# Patient Record
Sex: Female | Born: 1955
Health system: Southern US, Community
[De-identification: ages and names within clinical notes are randomized; demographics above are authoritative.]

## PROBLEM LIST (undated history)

## (undated) DIAGNOSIS — G4733 Obstructive sleep apnea (adult) (pediatric): Secondary | ICD-10-CM

## (undated) DIAGNOSIS — M2550 Pain in unspecified joint: Secondary | ICD-10-CM

## (undated) DIAGNOSIS — I10 Essential (primary) hypertension: Secondary | ICD-10-CM

## (undated) DIAGNOSIS — N939 Abnormal uterine and vaginal bleeding, unspecified: Secondary | ICD-10-CM

## (undated) DIAGNOSIS — E039 Hypothyroidism, unspecified: Secondary | ICD-10-CM

## (undated) DIAGNOSIS — R600 Localized edema: Secondary | ICD-10-CM

## (undated) DIAGNOSIS — D509 Iron deficiency anemia, unspecified: Secondary | ICD-10-CM

## (undated) DIAGNOSIS — M549 Dorsalgia, unspecified: Secondary | ICD-10-CM

## (undated) DIAGNOSIS — M3500 Sicca syndrome, unspecified: Secondary | ICD-10-CM

## (undated) DIAGNOSIS — N920 Excessive and frequent menstruation with regular cycle: Secondary | ICD-10-CM

## (undated) HISTORY — DX: Morbid (severe) obesity due to excess calories: E66.01

## (undated) HISTORY — DX: Localized edema: R60.0

## (undated) HISTORY — DX: Excessive and frequent menstruation with regular cycle: N92.0

## (undated) HISTORY — DX: Essential (primary) hypertension: I10

## (undated) HISTORY — DX: Pain in unspecified joint: M25.50

## (undated) HISTORY — DX: Dorsalgia, unspecified: M54.9

## (undated) HISTORY — DX: Hypothyroidism, unspecified: E03.9

## (undated) HISTORY — DX: Iron deficiency anemia, unspecified: D50.9

## (undated) HISTORY — DX: Abnormal uterine and vaginal bleeding, unspecified: N93.9

## (undated) HISTORY — PX: WISDOM TOOTH EXTRACTION: SHX21

## (undated) HISTORY — DX: Obstructive sleep apnea (adult) (pediatric): G47.33

## (undated) HISTORY — DX: Sjogren syndrome, unspecified: M35.00

## (undated) HISTORY — PX: SHOULDER SURGERY: SHX246

## (undated) HISTORY — PX: TONSILLECTOMY: SUR1361

---

## 1975-03-10 HISTORY — PX: TUBAL LIGATION: SHX77

## 1997-03-09 HISTORY — PX: LUMBAR FUSION: SHX111

## 1997-08-14 ENCOUNTER — Encounter: Admission: RE | Admit: 1997-08-14 | Discharge: 1997-08-14 | Payer: Self-pay | Admitting: *Deleted

## 1997-09-01 ENCOUNTER — Emergency Department (HOSPITAL_COMMUNITY): Admission: EM | Admit: 1997-09-01 | Discharge: 1997-09-01 | Payer: Self-pay | Admitting: Emergency Medicine

## 1997-09-05 ENCOUNTER — Emergency Department (HOSPITAL_COMMUNITY): Admission: EM | Admit: 1997-09-05 | Discharge: 1997-09-05 | Payer: Self-pay | Admitting: Emergency Medicine

## 1997-11-10 ENCOUNTER — Emergency Department (HOSPITAL_COMMUNITY): Admission: EM | Admit: 1997-11-10 | Discharge: 1997-11-10 | Payer: Self-pay | Admitting: Emergency Medicine

## 1997-11-15 ENCOUNTER — Encounter: Admission: RE | Admit: 1997-11-15 | Discharge: 1998-02-13 | Payer: Self-pay

## 1997-11-15 ENCOUNTER — Emergency Department (HOSPITAL_COMMUNITY): Admission: EM | Admit: 1997-11-15 | Discharge: 1997-11-15 | Payer: Self-pay

## 1997-11-16 ENCOUNTER — Encounter: Admission: RE | Admit: 1997-11-16 | Discharge: 1998-02-14 | Payer: Self-pay | Admitting: Internal Medicine

## 1997-12-12 ENCOUNTER — Ambulatory Visit (HOSPITAL_COMMUNITY): Admission: RE | Admit: 1997-12-12 | Discharge: 1997-12-12 | Payer: Self-pay | Admitting: Neurosurgery

## 1997-12-12 ENCOUNTER — Encounter: Payer: Self-pay | Admitting: Neurosurgery

## 1997-12-26 ENCOUNTER — Encounter: Admission: RE | Admit: 1997-12-26 | Discharge: 1998-03-26 | Payer: Self-pay | Admitting: Anesthesiology

## 1998-01-23 ENCOUNTER — Inpatient Hospital Stay (HOSPITAL_COMMUNITY): Admission: AD | Admit: 1998-01-23 | Discharge: 1998-01-23 | Payer: Self-pay | Admitting: Obstetrics

## 1998-01-25 ENCOUNTER — Encounter: Payer: Self-pay | Admitting: Neurosurgery

## 1998-01-25 ENCOUNTER — Inpatient Hospital Stay (HOSPITAL_COMMUNITY): Admission: RE | Admit: 1998-01-25 | Discharge: 1998-01-26 | Payer: Self-pay | Admitting: Neurosurgery

## 1998-03-18 ENCOUNTER — Ambulatory Visit (HOSPITAL_COMMUNITY): Admission: RE | Admit: 1998-03-18 | Discharge: 1998-03-18 | Payer: Self-pay | Admitting: Neurosurgery

## 1998-03-18 ENCOUNTER — Encounter: Payer: Self-pay | Admitting: Neurosurgery

## 1998-04-30 ENCOUNTER — Ambulatory Visit (HOSPITAL_COMMUNITY): Admission: RE | Admit: 1998-04-30 | Discharge: 1998-04-30 | Payer: Self-pay | Admitting: Neurosurgery

## 1998-04-30 ENCOUNTER — Encounter: Payer: Self-pay | Admitting: Neurosurgery

## 1998-08-22 ENCOUNTER — Encounter: Admission: RE | Admit: 1998-08-22 | Discharge: 1998-08-22 | Payer: Self-pay | Admitting: Internal Medicine

## 1998-08-23 ENCOUNTER — Encounter: Admission: RE | Admit: 1998-08-23 | Discharge: 1998-08-23 | Payer: Self-pay | Admitting: Internal Medicine

## 1998-08-29 ENCOUNTER — Encounter: Admission: RE | Admit: 1998-08-29 | Discharge: 1998-08-29 | Payer: Self-pay | Admitting: Hematology and Oncology

## 1998-08-30 ENCOUNTER — Encounter: Admission: RE | Admit: 1998-08-30 | Discharge: 1998-11-28 | Payer: Self-pay | Admitting: *Deleted

## 1998-09-04 ENCOUNTER — Ambulatory Visit (HOSPITAL_COMMUNITY): Admission: RE | Admit: 1998-09-04 | Discharge: 1998-09-04 | Payer: Self-pay | Admitting: *Deleted

## 1998-09-07 DIAGNOSIS — E039 Hypothyroidism, unspecified: Secondary | ICD-10-CM | POA: Insufficient documentation

## 1998-09-07 DIAGNOSIS — D509 Iron deficiency anemia, unspecified: Secondary | ICD-10-CM | POA: Insufficient documentation

## 1998-10-02 ENCOUNTER — Encounter: Admission: RE | Admit: 1998-10-02 | Discharge: 1998-10-02 | Payer: Self-pay | Admitting: Hematology and Oncology

## 1998-10-15 ENCOUNTER — Encounter: Admission: RE | Admit: 1998-10-15 | Discharge: 1998-10-15 | Payer: Self-pay | Admitting: Hematology and Oncology

## 1998-10-23 ENCOUNTER — Inpatient Hospital Stay (HOSPITAL_COMMUNITY): Admission: AD | Admit: 1998-10-23 | Discharge: 1998-10-23 | Payer: Self-pay | Admitting: *Deleted

## 1998-11-15 ENCOUNTER — Encounter: Admission: RE | Admit: 1998-11-15 | Discharge: 1998-11-15 | Payer: Self-pay | Admitting: Obstetrics & Gynecology

## 1998-11-15 ENCOUNTER — Encounter (INDEPENDENT_AMBULATORY_CARE_PROVIDER_SITE_OTHER): Payer: Self-pay | Admitting: Hospitalist

## 1998-11-15 LAB — CONVERTED CEMR LAB: Pap Smear: NORMAL

## 1998-11-20 ENCOUNTER — Encounter: Admission: RE | Admit: 1998-11-20 | Discharge: 1998-11-20 | Payer: Self-pay | Admitting: Internal Medicine

## 1999-01-09 ENCOUNTER — Encounter: Admission: RE | Admit: 1999-01-09 | Discharge: 1999-01-09 | Payer: Self-pay | Admitting: Hematology and Oncology

## 1999-04-15 ENCOUNTER — Encounter: Admission: RE | Admit: 1999-04-15 | Discharge: 1999-04-15 | Payer: Self-pay | Admitting: Internal Medicine

## 1999-04-25 ENCOUNTER — Encounter: Admission: RE | Admit: 1999-04-25 | Discharge: 1999-04-25 | Payer: Self-pay | Admitting: Internal Medicine

## 2000-08-02 ENCOUNTER — Observation Stay (HOSPITAL_COMMUNITY): Admission: EM | Admit: 2000-08-02 | Discharge: 2000-08-03 | Payer: Self-pay

## 2000-08-02 ENCOUNTER — Encounter: Payer: Self-pay | Admitting: Emergency Medicine

## 2000-08-03 ENCOUNTER — Encounter: Payer: Self-pay | Admitting: Internal Medicine

## 2000-09-13 ENCOUNTER — Encounter: Admission: RE | Admit: 2000-09-13 | Discharge: 2000-09-13 | Payer: Self-pay | Admitting: Internal Medicine

## 2002-09-12 ENCOUNTER — Encounter: Admission: RE | Admit: 2002-09-12 | Discharge: 2002-09-12 | Payer: Self-pay | Admitting: Internal Medicine

## 2002-10-24 ENCOUNTER — Encounter: Admission: RE | Admit: 2002-10-24 | Discharge: 2002-10-24 | Payer: Self-pay | Admitting: Internal Medicine

## 2002-11-01 ENCOUNTER — Ambulatory Visit (HOSPITAL_COMMUNITY): Admission: RE | Admit: 2002-11-01 | Discharge: 2002-11-01 | Payer: Self-pay | Admitting: Hospitalist

## 2002-11-01 ENCOUNTER — Encounter: Payer: Self-pay | Admitting: Cardiology

## 2002-11-06 ENCOUNTER — Ambulatory Visit (HOSPITAL_COMMUNITY): Admission: RE | Admit: 2002-11-06 | Discharge: 2002-11-06 | Payer: Self-pay | Admitting: Internal Medicine

## 2003-01-23 ENCOUNTER — Encounter: Admission: RE | Admit: 2003-01-23 | Discharge: 2003-01-23 | Payer: Self-pay | Admitting: Internal Medicine

## 2003-01-24 ENCOUNTER — Encounter: Admission: RE | Admit: 2003-01-24 | Discharge: 2003-01-24 | Payer: Self-pay | Admitting: Internal Medicine

## 2003-02-05 ENCOUNTER — Encounter: Admission: RE | Admit: 2003-02-05 | Discharge: 2003-02-05 | Payer: Self-pay | Admitting: Internal Medicine

## 2003-02-06 ENCOUNTER — Encounter: Admission: RE | Admit: 2003-02-06 | Discharge: 2003-02-06 | Payer: Self-pay | Admitting: Internal Medicine

## 2003-03-13 ENCOUNTER — Encounter: Admission: RE | Admit: 2003-03-13 | Discharge: 2003-03-13 | Payer: Self-pay | Admitting: Internal Medicine

## 2003-04-17 ENCOUNTER — Ambulatory Visit (HOSPITAL_COMMUNITY): Admission: RE | Admit: 2003-04-17 | Discharge: 2003-04-17 | Payer: Self-pay | Admitting: Internal Medicine

## 2003-04-17 ENCOUNTER — Encounter: Admission: RE | Admit: 2003-04-17 | Discharge: 2003-04-17 | Payer: Self-pay | Admitting: Internal Medicine

## 2003-05-02 ENCOUNTER — Encounter: Payer: Self-pay | Admitting: Cardiology

## 2003-05-02 ENCOUNTER — Ambulatory Visit: Admission: RE | Admit: 2003-05-02 | Discharge: 2003-05-02 | Payer: Self-pay | Admitting: Internal Medicine

## 2003-05-09 ENCOUNTER — Encounter: Admission: RE | Admit: 2003-05-09 | Discharge: 2003-05-09 | Payer: Self-pay | Admitting: Internal Medicine

## 2003-05-23 ENCOUNTER — Ambulatory Visit (HOSPITAL_COMMUNITY): Admission: RE | Admit: 2003-05-23 | Discharge: 2003-05-23 | Payer: Self-pay | Admitting: Internal Medicine

## 2003-05-23 ENCOUNTER — Encounter: Admission: RE | Admit: 2003-05-23 | Discharge: 2003-05-23 | Payer: Self-pay | Admitting: Internal Medicine

## 2003-09-07 DIAGNOSIS — G4733 Obstructive sleep apnea (adult) (pediatric): Secondary | ICD-10-CM | POA: Insufficient documentation

## 2003-09-12 ENCOUNTER — Encounter: Admission: RE | Admit: 2003-09-12 | Discharge: 2003-09-12 | Payer: Self-pay | Admitting: Internal Medicine

## 2003-09-12 ENCOUNTER — Ambulatory Visit (HOSPITAL_COMMUNITY): Admission: RE | Admit: 2003-09-12 | Discharge: 2003-09-12 | Payer: Self-pay | Admitting: Internal Medicine

## 2003-09-19 ENCOUNTER — Ambulatory Visit (HOSPITAL_BASED_OUTPATIENT_CLINIC_OR_DEPARTMENT_OTHER): Admission: RE | Admit: 2003-09-19 | Discharge: 2003-09-19 | Payer: Self-pay | Admitting: Hospitalist

## 2003-10-10 ENCOUNTER — Emergency Department (HOSPITAL_COMMUNITY): Admission: EM | Admit: 2003-10-10 | Discharge: 2003-10-10 | Payer: Self-pay | Admitting: Emergency Medicine

## 2003-10-12 ENCOUNTER — Ambulatory Visit (HOSPITAL_COMMUNITY): Admission: RE | Admit: 2003-10-12 | Discharge: 2003-10-12 | Payer: Self-pay | Admitting: Internal Medicine

## 2003-10-12 ENCOUNTER — Encounter: Admission: RE | Admit: 2003-10-12 | Discharge: 2003-10-12 | Payer: Self-pay | Admitting: Internal Medicine

## 2003-10-21 ENCOUNTER — Ambulatory Visit (HOSPITAL_COMMUNITY): Admission: RE | Admit: 2003-10-21 | Discharge: 2003-10-21 | Payer: Self-pay | Admitting: Hospitalist

## 2003-10-31 ENCOUNTER — Encounter: Admission: RE | Admit: 2003-10-31 | Discharge: 2003-10-31 | Payer: Self-pay | Admitting: Internal Medicine

## 2003-12-31 ENCOUNTER — Ambulatory Visit (HOSPITAL_COMMUNITY): Admission: RE | Admit: 2003-12-31 | Discharge: 2003-12-31 | Payer: Self-pay | Admitting: Internal Medicine

## 2003-12-31 ENCOUNTER — Ambulatory Visit: Payer: Self-pay | Admitting: Internal Medicine

## 2004-01-14 ENCOUNTER — Ambulatory Visit: Payer: Self-pay | Admitting: Internal Medicine

## 2004-06-12 ENCOUNTER — Ambulatory Visit: Payer: Self-pay | Admitting: Internal Medicine

## 2004-09-16 ENCOUNTER — Ambulatory Visit: Payer: Self-pay | Admitting: Hospitalist

## 2004-09-23 ENCOUNTER — Ambulatory Visit (HOSPITAL_COMMUNITY): Admission: RE | Admit: 2004-09-23 | Discharge: 2004-09-23 | Payer: Self-pay | Admitting: Hospitalist

## 2004-10-01 ENCOUNTER — Ambulatory Visit: Payer: Self-pay | Admitting: Internal Medicine

## 2004-10-06 ENCOUNTER — Encounter: Admission: RE | Admit: 2004-10-06 | Discharge: 2005-01-04 | Payer: Self-pay

## 2004-10-30 ENCOUNTER — Ambulatory Visit: Payer: Self-pay | Admitting: Internal Medicine

## 2004-11-18 ENCOUNTER — Encounter (INDEPENDENT_AMBULATORY_CARE_PROVIDER_SITE_OTHER): Payer: Self-pay | Admitting: Hospitalist

## 2004-11-18 ENCOUNTER — Ambulatory Visit: Payer: Self-pay | Admitting: Family Medicine

## 2004-11-18 LAB — CONVERTED CEMR LAB: Pap Smear: NORMAL

## 2005-01-23 ENCOUNTER — Ambulatory Visit: Payer: Self-pay | Admitting: Family Medicine

## 2005-01-27 ENCOUNTER — Ambulatory Visit (HOSPITAL_COMMUNITY): Admission: RE | Admit: 2005-01-27 | Discharge: 2005-01-27 | Payer: Self-pay | Admitting: *Deleted

## 2005-02-24 ENCOUNTER — Ambulatory Visit: Payer: Self-pay | Admitting: Obstetrics and Gynecology

## 2005-03-12 ENCOUNTER — Ambulatory Visit: Payer: Self-pay | Admitting: Hospitalist

## 2005-04-01 ENCOUNTER — Ambulatory Visit: Payer: Self-pay | Admitting: Internal Medicine

## 2005-05-14 ENCOUNTER — Ambulatory Visit: Payer: Self-pay | Admitting: Hospitalist

## 2005-06-18 ENCOUNTER — Ambulatory Visit: Payer: Self-pay | Admitting: Hospitalist

## 2005-06-18 ENCOUNTER — Ambulatory Visit (HOSPITAL_COMMUNITY): Admission: RE | Admit: 2005-06-18 | Discharge: 2005-06-18 | Payer: Self-pay | Admitting: Hospitalist

## 2005-07-28 ENCOUNTER — Ambulatory Visit: Payer: Self-pay | Admitting: Hospitalist

## 2005-11-18 ENCOUNTER — Ambulatory Visit: Payer: Self-pay | Admitting: Internal Medicine

## 2005-12-16 ENCOUNTER — Ambulatory Visit: Payer: Self-pay | Admitting: Hospitalist

## 2005-12-16 ENCOUNTER — Inpatient Hospital Stay (HOSPITAL_COMMUNITY): Admission: AD | Admit: 2005-12-16 | Discharge: 2005-12-20 | Payer: Self-pay | Admitting: Hospitalist

## 2005-12-17 ENCOUNTER — Encounter (INDEPENDENT_AMBULATORY_CARE_PROVIDER_SITE_OTHER): Payer: Self-pay | Admitting: *Deleted

## 2006-03-17 ENCOUNTER — Encounter (INDEPENDENT_AMBULATORY_CARE_PROVIDER_SITE_OTHER): Payer: Self-pay | Admitting: Hospitalist

## 2006-03-17 DIAGNOSIS — G8929 Other chronic pain: Secondary | ICD-10-CM | POA: Insufficient documentation

## 2006-03-17 DIAGNOSIS — M545 Low back pain, unspecified: Secondary | ICD-10-CM | POA: Insufficient documentation

## 2006-03-17 DIAGNOSIS — I10 Essential (primary) hypertension: Secondary | ICD-10-CM | POA: Insufficient documentation

## 2006-03-17 DIAGNOSIS — M3500 Sicca syndrome, unspecified: Secondary | ICD-10-CM | POA: Insufficient documentation

## 2006-03-17 DIAGNOSIS — M25569 Pain in unspecified knee: Secondary | ICD-10-CM | POA: Insufficient documentation

## 2006-04-05 ENCOUNTER — Telehealth: Payer: Self-pay | Admitting: *Deleted

## 2006-07-29 ENCOUNTER — Ambulatory Visit: Payer: Self-pay | Admitting: Hospitalist

## 2006-07-29 LAB — CONVERTED CEMR LAB
ALT: 10 U/L
AST: 16 U/L
Albumin: 4.1 g/dL
Alkaline Phosphatase: 54 U/L
BUN: 12 mg/dL
Bilirubin Urine: NEGATIVE
Blood in Urine, dipstick: NEGATIVE
CO2: 30 meq/L
Calcium: 9 mg/dL
Chloride: 98 meq/L
Cholesterol: 171 mg/dL
Creatinine, Ser: 0.9 mg/dL
Glucose, Bld: 84 mg/dL
Glucose, Urine, Semiquant: NEGATIVE
HCT: 35.1 % — ABNORMAL LOW
HDL: 44 mg/dL
Hemoglobin: 10.7 g/dL — ABNORMAL LOW
Ketones, urine, test strip: NEGATIVE
LDL Cholesterol: 103 mg/dL — ABNORMAL HIGH
MCHC: 30.5 g/dL
MCV: 75.5 fL — ABNORMAL LOW
Nitrite: NEGATIVE
Platelets: 194 10*3/uL
Potassium: 4.1 meq/L
Protein, U semiquant: NEGATIVE
RBC: 4.65 M/uL
RDW: 15.3 % — ABNORMAL HIGH
Sodium: 137 meq/L
Specific Gravity, Urine: 1.015
TSH: 3.467 u[IU]/mL
Total Bilirubin: 0.6 mg/dL
Total CHOL/HDL Ratio: 3.9
Total Protein: 7.9 g/dL
Triglycerides: 122 mg/dL
Urobilinogen, UA: 0.2
VLDL: 24 mg/dL
WBC Urine, dipstick: NEGATIVE
WBC: 4.6 10*3/uL
pH: 5.5

## 2006-08-26 ENCOUNTER — Encounter (INDEPENDENT_AMBULATORY_CARE_PROVIDER_SITE_OTHER): Payer: Self-pay | Admitting: Internal Medicine

## 2006-08-26 ENCOUNTER — Ambulatory Visit: Payer: Self-pay | Admitting: Obstetrics & Gynecology

## 2006-08-26 ENCOUNTER — Encounter (INDEPENDENT_AMBULATORY_CARE_PROVIDER_SITE_OTHER): Payer: Self-pay | Admitting: Obstetrics & Gynecology

## 2006-08-26 LAB — CONVERTED CEMR LAB

## 2006-08-30 ENCOUNTER — Ambulatory Visit (HOSPITAL_COMMUNITY): Admission: RE | Admit: 2006-08-30 | Discharge: 2006-08-30 | Payer: Self-pay | Admitting: Obstetrics and Gynecology

## 2006-08-30 ENCOUNTER — Encounter (INDEPENDENT_AMBULATORY_CARE_PROVIDER_SITE_OTHER): Payer: Self-pay | Admitting: Hospitalist

## 2006-09-22 ENCOUNTER — Ambulatory Visit: Payer: Self-pay | Admitting: Obstetrics & Gynecology

## 2006-09-22 ENCOUNTER — Encounter (INDEPENDENT_AMBULATORY_CARE_PROVIDER_SITE_OTHER): Payer: Self-pay | Admitting: Hospitalist

## 2006-10-20 ENCOUNTER — Encounter (INDEPENDENT_AMBULATORY_CARE_PROVIDER_SITE_OTHER): Payer: Self-pay | Admitting: Hospitalist

## 2006-12-02 ENCOUNTER — Ambulatory Visit: Payer: Self-pay | Admitting: Hospitalist

## 2006-12-02 DIAGNOSIS — N63 Unspecified lump in unspecified breast: Secondary | ICD-10-CM | POA: Insufficient documentation

## 2006-12-06 ENCOUNTER — Telehealth (INDEPENDENT_AMBULATORY_CARE_PROVIDER_SITE_OTHER): Payer: Self-pay | Admitting: *Deleted

## 2006-12-07 ENCOUNTER — Encounter: Admission: RE | Admit: 2006-12-07 | Discharge: 2006-12-07 | Payer: Self-pay | Admitting: Hospitalist

## 2007-01-03 ENCOUNTER — Ambulatory Visit: Payer: Self-pay | Admitting: Hospitalist

## 2007-01-03 LAB — CONVERTED CEMR LAB
Basophils Absolute: 0 10*3/uL
Basophils Relative: 1 %
Eosinophils Absolute: 0.1 10*3/uL
Eosinophils Relative: 3 %
Ferritin: 41 ng/mL
HCT: 34.9 % — ABNORMAL LOW
Hemoglobin: 10.7 g/dL — ABNORMAL LOW
Lymphocytes Relative: 40 %
Lymphs Abs: 2 10*3/uL
MCHC: 30.7 g/dL
MCV: 76 fL — ABNORMAL LOW
Monocytes Absolute: 0.6 10*3/uL
Monocytes Relative: 12 % — ABNORMAL HIGH
Neutro Abs: 2.2 10*3/uL
Neutrophils Relative %: 44 %
Platelets: 188 10*3/uL
RBC: 4.59 M/uL
RDW: 15.4 % — ABNORMAL HIGH
TSH: 2.982 u[IU]/mL
WBC: 4.9 10*3/uL

## 2007-01-06 ENCOUNTER — Ambulatory Visit: Payer: Self-pay | Admitting: Obstetrics and Gynecology

## 2007-02-02 ENCOUNTER — Encounter (INDEPENDENT_AMBULATORY_CARE_PROVIDER_SITE_OTHER): Payer: Self-pay | Admitting: Hospitalist

## 2007-02-09 ENCOUNTER — Telehealth (INDEPENDENT_AMBULATORY_CARE_PROVIDER_SITE_OTHER): Payer: Self-pay | Admitting: *Deleted

## 2007-05-12 ENCOUNTER — Ambulatory Visit: Payer: Self-pay | Admitting: Hospitalist

## 2007-05-12 LAB — CONVERTED CEMR LAB
ALT: <8 U/L
AST: 13 U/L
Albumin: 4 g/dL
Alkaline Phosphatase: 49 U/L
BUN: 11 mg/dL
CO2: 26 meq/L
Calcium: 8.9 mg/dL
Chloride: 100 meq/L
Cholesterol: 161 mg/dL
Creatinine, Ser: 0.76 mg/dL
Glucose, Bld: 92 mg/dL
HCT: 32.4 % — ABNORMAL LOW
HDL: 49 mg/dL
Hemoglobin: 9.8 g/dL — ABNORMAL LOW
LDL Cholesterol: 90 mg/dL
MCHC: 30.2 g/dL
MCV: 75.5 fL — ABNORMAL LOW
Platelets: 209 10*3/uL
Potassium: 4 meq/L
RBC: 4.29 M/uL
RDW: 14.8 %
Sodium: 138 meq/L
Total Bilirubin: 0.6 mg/dL
Total CHOL/HDL Ratio: 3.3
Total Protein: 7.5 g/dL
Triglycerides: 108 mg/dL
VLDL: 22 mg/dL
WBC: 4.5 10*3/uL

## 2007-05-31 ENCOUNTER — Ambulatory Visit: Payer: Self-pay | Admitting: Hospitalist

## 2007-05-31 ENCOUNTER — Ambulatory Visit (HOSPITAL_COMMUNITY): Admission: RE | Admit: 2007-05-31 | Discharge: 2007-05-31 | Payer: Self-pay | Admitting: Hospitalist

## 2007-08-08 ENCOUNTER — Ambulatory Visit: Payer: Self-pay | Admitting: Hospitalist

## 2007-09-22 ENCOUNTER — Encounter (INDEPENDENT_AMBULATORY_CARE_PROVIDER_SITE_OTHER): Payer: Self-pay | Admitting: Internal Medicine

## 2007-11-02 ENCOUNTER — Ambulatory Visit: Payer: Self-pay | Admitting: *Deleted

## 2007-11-02 ENCOUNTER — Encounter: Payer: Self-pay | Admitting: Internal Medicine

## 2007-11-02 ENCOUNTER — Ambulatory Visit (HOSPITAL_COMMUNITY): Admission: RE | Admit: 2007-11-02 | Discharge: 2007-11-02 | Payer: Self-pay | Admitting: *Deleted

## 2007-11-02 LAB — CONVERTED CEMR LAB: TSH: 2.719 u[IU]/mL

## 2007-11-12 ENCOUNTER — Encounter (INDEPENDENT_AMBULATORY_CARE_PROVIDER_SITE_OTHER): Payer: Self-pay | Admitting: Internal Medicine

## 2007-11-15 ENCOUNTER — Encounter: Payer: Self-pay | Admitting: Internal Medicine

## 2007-11-25 ENCOUNTER — Encounter (INDEPENDENT_AMBULATORY_CARE_PROVIDER_SITE_OTHER): Payer: Self-pay | Admitting: Internal Medicine

## 2007-11-25 LAB — HM DIABETES EYE EXAM

## 2007-12-06 ENCOUNTER — Encounter (INDEPENDENT_AMBULATORY_CARE_PROVIDER_SITE_OTHER): Payer: Self-pay | Admitting: Internal Medicine

## 2007-12-07 ENCOUNTER — Encounter (INDEPENDENT_AMBULATORY_CARE_PROVIDER_SITE_OTHER): Payer: Self-pay | Admitting: Internal Medicine

## 2007-12-12 ENCOUNTER — Encounter (INDEPENDENT_AMBULATORY_CARE_PROVIDER_SITE_OTHER): Payer: Self-pay | Admitting: Internal Medicine

## 2008-01-04 ENCOUNTER — Ambulatory Visit (HOSPITAL_COMMUNITY): Admission: RE | Admit: 2008-01-04 | Discharge: 2008-01-05 | Payer: Self-pay | Admitting: Orthopedic Surgery

## 2008-01-17 ENCOUNTER — Ambulatory Visit: Payer: Self-pay | Admitting: Internal Medicine

## 2008-01-19 ENCOUNTER — Encounter (INDEPENDENT_AMBULATORY_CARE_PROVIDER_SITE_OTHER): Payer: Self-pay | Admitting: Internal Medicine

## 2008-01-23 LAB — CONVERTED CEMR LAB
Ferritin: 88 ng/mL
HCT: 31.8 % — ABNORMAL LOW
Hemoglobin: 9.4 g/dL — ABNORMAL LOW
MCHC: 29.6 g/dL — ABNORMAL LOW
MCV: 75.2 fL — ABNORMAL LOW
Platelets: 289 10*3/uL
RBC: 4.23 M/uL
RDW: 15.1 %
WBC: 5.5 10*3/uL

## 2008-01-27 ENCOUNTER — Ambulatory Visit: Payer: Self-pay | Admitting: Infectious Diseases

## 2008-01-27 ENCOUNTER — Encounter (INDEPENDENT_AMBULATORY_CARE_PROVIDER_SITE_OTHER): Payer: Self-pay | Admitting: *Deleted

## 2008-01-27 ENCOUNTER — Encounter (INDEPENDENT_AMBULATORY_CARE_PROVIDER_SITE_OTHER): Payer: Self-pay | Admitting: Internal Medicine

## 2008-01-27 DIAGNOSIS — K649 Unspecified hemorrhoids: Secondary | ICD-10-CM | POA: Insufficient documentation

## 2008-01-27 LAB — CONVERTED CEMR LAB
BUN: 10 mg/dL
CO2: 27 meq/L
Calcium: 8.8 mg/dL
Chloride: 101 meq/L
Creatinine, Ser: 0.79 mg/dL
Glucose, Bld: 97 mg/dL
Potassium: 4 meq/L
Sodium: 137 meq/L

## 2008-01-30 ENCOUNTER — Encounter (INDEPENDENT_AMBULATORY_CARE_PROVIDER_SITE_OTHER): Payer: Self-pay | Admitting: Internal Medicine

## 2008-05-15 ENCOUNTER — Encounter (INDEPENDENT_AMBULATORY_CARE_PROVIDER_SITE_OTHER): Payer: Self-pay | Admitting: Internal Medicine

## 2008-05-22 ENCOUNTER — Ambulatory Visit: Payer: Self-pay | Admitting: Internal Medicine

## 2008-05-22 DIAGNOSIS — H3530 Unspecified macular degeneration: Secondary | ICD-10-CM | POA: Insufficient documentation

## 2008-05-22 DIAGNOSIS — H8110 Benign paroxysmal vertigo, unspecified ear: Secondary | ICD-10-CM | POA: Insufficient documentation

## 2008-05-23 ENCOUNTER — Telehealth (INDEPENDENT_AMBULATORY_CARE_PROVIDER_SITE_OTHER): Payer: Self-pay | Admitting: Internal Medicine

## 2008-05-24 ENCOUNTER — Encounter (INDEPENDENT_AMBULATORY_CARE_PROVIDER_SITE_OTHER): Payer: Self-pay | Admitting: Internal Medicine

## 2008-05-24 LAB — CONVERTED CEMR LAB
BUN: 12 mg/dL
CO2: 26 meq/L
Calcium: 9.2 mg/dL
Chloride: 101 meq/L
Creatinine, Ser: 0.85 mg/dL
Glucose, Bld: 106 mg/dL — ABNORMAL HIGH
HCT: 33 % — ABNORMAL LOW
Hemoglobin: 10.5 g/dL — ABNORMAL LOW
MCHC: 31.8 g/dL
MCV: 72.5 fL — ABNORMAL LOW
Platelets: 256 10*3/uL
Potassium: 3.8 meq/L
RBC: 4.55 M/uL
RDW: 15 %
Sodium: 140 meq/L
TSH: 3.699 u[IU]/mL
WBC: 5.2 10*3/uL

## 2008-06-07 ENCOUNTER — Ambulatory Visit: Payer: Self-pay | Admitting: Internal Medicine

## 2008-06-08 ENCOUNTER — Ambulatory Visit (HOSPITAL_COMMUNITY): Admission: RE | Admit: 2008-06-08 | Discharge: 2008-06-08 | Payer: Self-pay | Admitting: Internal Medicine

## 2008-06-13 ENCOUNTER — Encounter: Admission: RE | Admit: 2008-06-13 | Discharge: 2008-09-11 | Payer: Self-pay | Admitting: Internal Medicine

## 2008-06-19 ENCOUNTER — Encounter (INDEPENDENT_AMBULATORY_CARE_PROVIDER_SITE_OTHER): Payer: Self-pay | Admitting: Internal Medicine

## 2008-06-26 ENCOUNTER — Ambulatory Visit: Payer: Self-pay | Admitting: Internal Medicine

## 2008-07-11 ENCOUNTER — Encounter (INDEPENDENT_AMBULATORY_CARE_PROVIDER_SITE_OTHER): Payer: Self-pay | Admitting: Internal Medicine

## 2008-07-11 ENCOUNTER — Ambulatory Visit: Payer: Self-pay | Admitting: Infectious Disease

## 2008-09-03 ENCOUNTER — Encounter (INDEPENDENT_AMBULATORY_CARE_PROVIDER_SITE_OTHER): Payer: Self-pay | Admitting: Internal Medicine

## 2008-09-19 ENCOUNTER — Ambulatory Visit: Payer: Self-pay | Admitting: Internal Medicine

## 2008-11-30 ENCOUNTER — Telehealth (INDEPENDENT_AMBULATORY_CARE_PROVIDER_SITE_OTHER): Payer: Self-pay | Admitting: *Deleted

## 2008-12-06 ENCOUNTER — Ambulatory Visit: Payer: Self-pay | Admitting: Internal Medicine

## 2008-12-06 LAB — CONVERTED CEMR LAB
BUN: 9 mg/dL
Bilirubin Urine: NEGATIVE
CO2: 26 meq/L
Calcium: 8.8 mg/dL
Chloride: 101 meq/L
Cholesterol: 173 mg/dL
Creatinine, Ser: 0.8 mg/dL
Glucose, Bld: 94 mg/dL
HDL: 45 mg/dL
Hemoglobin, Urine: NEGATIVE
Ketones, ur: NEGATIVE mg/dL
LDL Cholesterol: 107 mg/dL — ABNORMAL HIGH
Nitrite: NEGATIVE
Potassium: 4.1 meq/L
Protein, ur: NEGATIVE mg/dL
RBC / HPF: NONE SEEN
Sodium: 138 meq/L
Specific Gravity, Urine: 1.02
Total CHOL/HDL Ratio: 3.8
Triglycerides: 106 mg/dL
Urine Glucose: NEGATIVE mg/dL
Urobilinogen, UA: 1
VLDL: 21 mg/dL
pH: 7

## 2008-12-07 ENCOUNTER — Telehealth: Payer: Self-pay | Admitting: Internal Medicine

## 2008-12-19 ENCOUNTER — Encounter: Admission: RE | Admit: 2008-12-19 | Discharge: 2009-01-23 | Payer: Self-pay | Admitting: Internal Medicine

## 2008-12-27 ENCOUNTER — Telehealth (INDEPENDENT_AMBULATORY_CARE_PROVIDER_SITE_OTHER): Payer: Self-pay | Admitting: Internal Medicine

## 2009-01-03 ENCOUNTER — Encounter: Payer: Self-pay | Admitting: Internal Medicine

## 2009-01-17 ENCOUNTER — Ambulatory Visit: Payer: Self-pay | Admitting: Internal Medicine

## 2009-01-30 ENCOUNTER — Encounter: Payer: Self-pay | Admitting: Internal Medicine

## 2009-03-10 ENCOUNTER — Encounter: Payer: Self-pay | Admitting: Internal Medicine

## 2009-04-03 ENCOUNTER — Ambulatory Visit: Payer: Self-pay | Admitting: Internal Medicine

## 2009-04-03 ENCOUNTER — Encounter: Payer: Self-pay | Admitting: Internal Medicine

## 2009-04-15 ENCOUNTER — Encounter: Payer: Self-pay | Admitting: Internal Medicine

## 2009-04-29 ENCOUNTER — Ambulatory Visit: Payer: Self-pay | Admitting: Internal Medicine

## 2009-04-29 LAB — CONVERTED CEMR LAB
Bilirubin Urine: NEGATIVE
Hemoglobin, Urine: NEGATIVE
Ketones, ur: NEGATIVE mg/dL
Leukocytes, UA: NEGATIVE
Nitrite: NEGATIVE
Protein, ur: NEGATIVE mg/dL
Specific Gravity, Urine: 1.016
Urine Glucose: NEGATIVE mg/dL
Urobilinogen, UA: 0.2
pH: 7.5

## 2009-05-03 ENCOUNTER — Encounter: Payer: Self-pay | Admitting: Internal Medicine

## 2009-05-13 ENCOUNTER — Encounter: Admission: RE | Admit: 2009-05-13 | Discharge: 2009-08-11 | Payer: Self-pay | Admitting: Internal Medicine

## 2009-05-14 ENCOUNTER — Ambulatory Visit: Payer: Self-pay | Admitting: Internal Medicine

## 2009-05-14 LAB — CONVERTED CEMR LAB
Bilirubin Urine: NEGATIVE
Hemoglobin, Urine: NEGATIVE
Ketones, ur: NEGATIVE mg/dL
Leukocytes, UA: NEGATIVE
Nitrite: NEGATIVE
Protein, ur: NEGATIVE mg/dL
Specific Gravity, Urine: 1.022
TSH: 3.239 u[IU]/mL
Urine Glucose: NEGATIVE mg/dL
Urobilinogen, UA: 1
pH: 6

## 2009-05-15 ENCOUNTER — Telehealth: Payer: Self-pay | Admitting: Internal Medicine

## 2009-05-22 ENCOUNTER — Encounter: Payer: Self-pay | Admitting: Internal Medicine

## 2009-05-30 ENCOUNTER — Ambulatory Visit: Payer: Self-pay | Admitting: Obstetrics and Gynecology

## 2009-05-30 ENCOUNTER — Ambulatory Visit (HOSPITAL_COMMUNITY): Admission: RE | Admit: 2009-05-30 | Discharge: 2009-05-30 | Payer: Self-pay | Admitting: Internal Medicine

## 2009-05-30 LAB — CONVERTED CEMR LAB
FSH: 21.9 m[IU]/mL
LH: 21.6 m[IU]/mL

## 2009-05-30 LAB — HM MAMMOGRAPHY: HM Mammogram: NEGATIVE

## 2009-06-12 ENCOUNTER — Ambulatory Visit: Payer: Self-pay | Admitting: Obstetrics and Gynecology

## 2009-06-18 ENCOUNTER — Ambulatory Visit (HOSPITAL_COMMUNITY): Admission: RE | Admit: 2009-06-18 | Discharge: 2009-06-18 | Payer: Self-pay | Admitting: Obstetrics and Gynecology

## 2009-06-18 ENCOUNTER — Telehealth: Payer: Self-pay | Admitting: Internal Medicine

## 2009-06-24 ENCOUNTER — Telehealth: Payer: Self-pay | Admitting: Internal Medicine

## 2009-06-26 ENCOUNTER — Telehealth: Payer: Self-pay | Admitting: Internal Medicine

## 2009-08-15 ENCOUNTER — Ambulatory Visit: Payer: Self-pay | Admitting: Internal Medicine

## 2009-08-15 DIAGNOSIS — R052 Subacute cough: Secondary | ICD-10-CM

## 2009-08-15 DIAGNOSIS — R05 Cough: Secondary | ICD-10-CM

## 2009-08-15 DIAGNOSIS — R059 Cough, unspecified: Secondary | ICD-10-CM | POA: Insufficient documentation

## 2009-08-15 HISTORY — DX: Subacute cough: R05.2

## 2009-08-16 LAB — CONVERTED CEMR LAB
ALT: 8 U/L
AST: 16 U/L
Albumin: 3.7 g/dL
Alkaline Phosphatase: 49 U/L
BUN: 9 mg/dL
Basophils Absolute: 0 10*3/uL
Basophils Relative: 0 %
CO2: 26 meq/L
Calcium: 8.3 mg/dL — ABNORMAL LOW
Chloride: 103 meq/L
Cholesterol: 171 mg/dL
Creatinine, Ser: 0.78 mg/dL
Eosinophils Absolute: 0.1 10*3/uL
Eosinophils Relative: 3 %
Glucose, Bld: 87 mg/dL
HCT: 31.9 % — ABNORMAL LOW
HDL: 41 mg/dL
Hemoglobin: 9.8 g/dL — ABNORMAL LOW
LDL Cholesterol: 111 mg/dL — ABNORMAL HIGH
Lymphocytes Relative: 49 % — ABNORMAL HIGH
Lymphs Abs: 2 10*3/uL
MCHC: 30.7 g/dL
MCV: 73.8 fL — ABNORMAL LOW
Monocytes Absolute: 0.5 10*3/uL
Monocytes Relative: 13 % — ABNORMAL HIGH
Neutro Abs: 1.4 10*3/uL — ABNORMAL LOW
Neutrophils Relative %: 35 % — ABNORMAL LOW
Platelets: 246 10*3/uL
Potassium: 4 meq/L
RBC: 4.32 M/uL
RDW: 15.4 %
Sodium: 136 meq/L
Total Bilirubin: 0.8 mg/dL
Total CHOL/HDL Ratio: 4.2
Total Protein: 7.4 g/dL
Triglycerides: 97 mg/dL
VLDL: 19 mg/dL
WBC: 4 10*3/uL

## 2009-08-26 ENCOUNTER — Ambulatory Visit: Payer: Self-pay | Admitting: Internal Medicine

## 2009-08-27 ENCOUNTER — Encounter (INDEPENDENT_AMBULATORY_CARE_PROVIDER_SITE_OTHER): Payer: Self-pay | Admitting: Internal Medicine

## 2009-08-27 ENCOUNTER — Ambulatory Visit: Payer: Self-pay | Admitting: Internal Medicine

## 2009-08-27 LAB — CONVERTED CEMR LAB
Ferritin: 80 ng/mL
Iron: 75 ug/dL
RBC Folate: 447 ng/mL
Saturation Ratios: 30 %
TIBC: 252 ug/dL
UIBC: 177 ug/dL
Vitamin B-12: 221 pg/mL

## 2009-09-13 ENCOUNTER — Ambulatory Visit: Payer: Self-pay | Admitting: Internal Medicine

## 2009-09-24 ENCOUNTER — Encounter: Payer: Self-pay | Admitting: Internal Medicine

## 2009-11-25 ENCOUNTER — Ambulatory Visit: Payer: Self-pay | Admitting: Internal Medicine

## 2009-11-25 ENCOUNTER — Encounter: Payer: Self-pay | Admitting: Internal Medicine

## 2009-12-02 ENCOUNTER — Encounter: Payer: Self-pay | Admitting: Internal Medicine

## 2009-12-06 ENCOUNTER — Telehealth: Payer: Self-pay | Admitting: *Deleted

## 2009-12-26 ENCOUNTER — Encounter: Payer: Self-pay | Admitting: Internal Medicine

## 2010-01-13 ENCOUNTER — Ambulatory Visit: Payer: Self-pay | Admitting: Internal Medicine

## 2010-02-04 ENCOUNTER — Telehealth: Payer: Self-pay | Admitting: *Deleted

## 2010-02-13 ENCOUNTER — Encounter
Admission: RE | Admit: 2010-02-13 | Discharge: 2010-03-06 | Payer: Self-pay | Source: Home / Self Care | Attending: Internal Medicine | Admitting: Internal Medicine

## 2010-03-03 ENCOUNTER — Encounter: Payer: Self-pay | Admitting: Internal Medicine

## 2010-03-12 ENCOUNTER — Telehealth: Payer: Self-pay | Admitting: *Deleted

## 2010-03-14 ENCOUNTER — Encounter: Payer: Self-pay | Admitting: Internal Medicine

## 2010-03-21 ENCOUNTER — Encounter: Payer: Self-pay | Admitting: Internal Medicine

## 2010-03-28 ENCOUNTER — Telehealth (INDEPENDENT_AMBULATORY_CARE_PROVIDER_SITE_OTHER): Payer: Self-pay | Admitting: *Deleted

## 2010-03-28 ENCOUNTER — Emergency Department (HOSPITAL_COMMUNITY)
Admission: EM | Admit: 2010-03-28 | Discharge: 2010-03-28 | Payer: Self-pay | Source: Home / Self Care | Admitting: Emergency Medicine

## 2010-03-30 ENCOUNTER — Encounter: Payer: Self-pay | Admitting: Internal Medicine

## 2010-03-30 ENCOUNTER — Encounter: Payer: Self-pay | Admitting: Obstetrics and Gynecology

## 2010-03-31 ENCOUNTER — Encounter: Payer: Self-pay | Admitting: Internal Medicine

## 2010-04-10 NOTE — Assessment & Plan Note (Signed)
 Summary: 2WK RECK/PHIFER/VS   Vital Signs:  Patient profile:   55 year old female Height:      63 inches (160.02 cm) Weight:      274.9 pounds (124.95 kg) BMI:     48.87 Temp:     97.8 degrees F (36.56 degrees C) oral Pulse rate:   84 / minute BP sitting:   165 / 86  (left arm)  Vitals Entered By: Adrien Ditzler RN (June 26, 2008 10:35 AM) Is Patient Diabetic? No Pain Assessment Patient in pain? yes     Location: back Intensity: 8 Onset of pain  long time Nutritional Status BMI of > 30 = obese Nutritional Status Detail appetite good  Have you ever been in a relationship where you felt threatened, hurt or afraid?denies   Does patient need assistance? Functional Status Self care Ambulation Wheelchair Comments Assist from grand daughter when needed. FU - better.   Primary Care Provider:  Inocente Lindsay MD   History of Present Illness: 55 yo female with primary Sjogren's, HTN, morbid obesity, chronic back pain, severe OSA, hypothyroidism came in for regular follow up from her last visit when she was evaluated for vertigo. She had CT of the head done and was also given medication for it and she reports feeling significantly better with dizziness almost completely resolved. Denies ear pain, congestion, no tinnitus, no fever or chills. Reports not taking her BP medicines this AM since she came here to clinic.   Preventive Screening-Counseling & Management     Smoking Status: never     Does Patient Exercise: yes     Type of exercise: WALKING     Exercise (avg: min/session):     Times/week:   1-2  Problems Prior to Update: 1)  Encounter For Long-term Use of Other Medications  (ICD-V58.69) 2)  Benign Paroxysmal Positional Vertigo  (ICD-386.11) 3)  Macular Degeneration, Bilateral  (ICD-362.50) 4)  Hemorrhoids  (ICD-455.6) 5)  Constipation, Drug Induced  (ICD-564.09) 6)  Shoulder Pain, Left  (ICD-719.41) 7)  Family History Breast Cancer 1st Degree Relative <50   (ICD-V16.3) 8)  Lump or Mass in Breast  (ICD-611.72) 9)  Hypertension  (ICD-401.9) 10)  Hypothyroidism  (ICD-244.9) 11)  Dysfunctional Uterine Bleeding  (ICD-626.8) 12)  Anemia, Iron Deficiency Nos  (ICD-280.9) 13)  Knee Pain, Chronic  (ICD-719.46) 14)  Menorrhagia  (ICD-626.2) 15)  Hyperplasia, Endometrial Nos  (ICD-621.30) 16)  Hx of Health Screening  (ICD-V70.0) 17)  Sleep Apnea  (ICD-780.57) 18)  Morbid Obesity  (ICD-278.01) 19)  Sicca Syndrome  (ICD-710.2) 20)  Back Pain  (ICD-724.5)  Medications Prior to Update: 1)  Hydrochlorothiazide  25 Mg Tabs (Hydrochlorothiazide ) .... Take 1 Tablet By Mouth Once A Day 2)  Levothyroxine  Sodium 50 Mcg Tabs (Levothyroxine  Sodium) .... Take 1 Tablet By Mouth Once A Day 3)  Cyclobenzaprine  Hcl 10 Mg Tabs (Cyclobenzaprine  Hcl) .... At Bedtime As Needed 4)  Oscal 500/200 D-3  Tabs (Calcium -Vitamin D Tabs) .... Take 1 Tablet By Mouth Two Times A Day 5)  Aspirin  81 Mg Tbec (Aspirin ) .... Take 1 Tablet By Mouth Once A Day 6)  Metoprolol  Tartrate 25 Mg Tabs (Metoprolol  Tartrate) .... Take One Tablet Two Times A Day 7)  Ferrous Sulfate  325 (65 Fe) Mg Tabs (Ferrous Sulfate ) .... Take One Tablet Three Times A Day 8)  Hydrocortisone 2.5 % Crea (Hydrocortisone) .... Apply Two Times A Day As Needed and After Bowel Movement. 9)  Hydrocortisone Acetate 25 Mg Supp (Hydrocortisone Acetate) .... Apply  Two Times A Day As Needed 10)  Antivert 25 Mg Tabs (Meclizine Hcl) .... Take 1 Tablet By Mouth Three Times A Day 11)  Vicodin 5-500 Mg Tabs (Hydrocodone -Acetaminophen ) .... Take 1 Tablet By Mouth Every 4 Hours As Needed For Pain  Current Medications (verified): 1)  Hydrochlorothiazide  25 Mg Tabs (Hydrochlorothiazide ) .... Take 1 Tablet By Mouth Once A Day 2)  Levothyroxine  Sodium 50 Mcg Tabs (Levothyroxine  Sodium) .... Take 1 Tablet By Mouth Once A Day 3)  Cyclobenzaprine  Hcl 10 Mg Tabs (Cyclobenzaprine  Hcl) .... At Bedtime As Needed 4)  Oscal 500/200 D-3  Tabs  (Calcium -Vitamin D Tabs) .... Take 1 Tablet By Mouth Two Times A Day 5)  Aspirin  81 Mg Tbec (Aspirin ) .... Take 1 Tablet By Mouth Once A Day 6)  Metoprolol  Tartrate 25 Mg Tabs (Metoprolol  Tartrate) .... Take One Tablet Two Times A Day 7)  Ferrous Sulfate  325 (65 Fe) Mg Tabs (Ferrous Sulfate ) .... Take One Tablet Three Times A Day 8)  Hydrocortisone 2.5 % Crea (Hydrocortisone) .... Apply Two Times A Day As Needed and After Bowel Movement. 9)  Hydrocortisone Acetate 25 Mg Supp (Hydrocortisone Acetate) .... Apply Two Times A Day As Needed 10)  Antivert 25 Mg Tabs (Meclizine Hcl) .... Take 1 Tablet By Mouth Three Times A Day 11)  Vicodin 5-500 Mg Tabs (Hydrocodone -Acetaminophen ) .... Take 1 Tablet By Mouth Every 4 Hours As Needed For Pain  Allergies: 1)  ! * Amlodipine  2)  ! * Aleve 3)  ! * Cocunax  Past History:  Past Medical History:    Primary Sjogren's syndrome with Sicca complex    -  anti-Ro +; ANA>1:1280 in homogenpattern    -  negativ dsDNA/ RF/ antiSmith/RNP/C3-4 comp/ La/ Jo1/ Sceleroderma/ centromere    -  neg HIV/ ACE/ Hep B/C    -  nl CXR 2/05    - Schirmer salivary gland test not done    -  Symptom Rx; eye drops/ prednisone / plaquenil     -  Referral to [facility] rheum, Dr. Norva Isaac 3/07. Saw Dr. Zieminski, but stopped 2/2 costs    Back pain-chronic low back L4-L5 discectomy; 11/99. Degenerative thoracic spondylotic changes 4/07    Hypertension    -  no LVH EKG 7/05    - nl M/C ratio 4/07    Morbid Obesity- ht unknown    Endometrial hyperplasia stripe > 37mm->7MM; GYN referral and BX  pending?     Obstructive sleep apnea-severe 09/2003 RD 161 per hr./cpap 18 cwp    Hypothyroidism-09/1998    Microcytic anemia-09/1998. Hgb 10.5/ MCV 76. Needs Ferritin to determine ACD vs IDA    Menorrhagia-10/1998    Uterine bleeding    Lower extremity edema    -  echo EF 55-65% w/o evidence of Dias Dysfx    -  M/C ratio nl 4/07    -  Hgb 10.5 4/07, MCV 76    Polyarthalgias  (knee/back/angle)w/ dx of fibromyalgia? given by [facility] rheum    -  Knee pain, chronic secondary to obesity, fibromyalgia,  and Sjogren's as per above    CP 10/07    -  neg adenosine myoview (08/08/2007)  Past Surgical History:    Tonsillectomy, age 55 (1963)     (03/17/2006)  Family History:    Mom- 24's from ovarian cancer. HTN.    Dad- 60's from brain cancer.     Sister died from breast cancer.     Family History Ovarian cancer    Family History  Breast cancer 1st degree relative <50     (05/12/2007)  Social History:    Her daughter [name]) often comes with her to visits. She also has a nurse, children's (whose name is Lynda as well).  (01/17/2008)  Risk Factors:    Alcohol Use: N/A    >5 drinks/d w/in last 3 months: N/A    Caffeine Use: N/A    Diet: N/A    Exercise: yes (06/26/2008)  Risk Factors:    Smoking Status: never (06/26/2008)    Packs/Day: N/A    Cigars/wk: N/A    Pipe Use/wk: N/A    Cans of tobacco/wk: N/A    Passive Smoke Exposure: N/A  Family History:    Reviewed history from 05/12/2007 and no changes required:       Mom- 60's from ovarian cancer. HTN.       Dad- 60's from brain cancer.        Sister died from breast cancer.        Family History Ovarian cancer       Family History Breast cancer 1st degree relative <50  Social History:    Reviewed history from 01/17/2008 and no changes required:       Her daughter [name]) often comes with her to visits. She also has a nurse, children's (whose name is Lynda as well).   Review of Systems  The patient denies fever, weight loss, weight gain, decreased hearing, hoarseness, chest pain, dyspnea on exertion, peripheral edema, prolonged cough, headaches, abdominal pain, depression, and unusual weight change.    Physical Exam  General:  alert, well-developed, well-nourished, and well-hydrated.   Ears:  R ear normal, L ear normal, and no external deformities,  patent ear canals, normal and visible TM. Lungs:  normal respiratory effort, no intercostal retractions, no accessory muscle use, normal breath sounds, no crackles, and no wheezes.   Heart:  normal rate, regular rhythm, no murmur, and no JVD.   Neurologic:  alert & oriented X3, cranial nerves II-XII intact, strength normal in all extremities, and sensation intact to light touch.   Psych:  Oriented X3, memory intact for recent and remote, normally interactive, good eye contact, not anxious appearing, and not depressed appearing.     Impression & Recommendations:  Problem # 1:  BENIGN PAROXYSMAL POSITIONAL VERTIGO (ICD-386.11) CT of the head and sinuses without contrast were done 2 weeks ago and the findings were WNL. Patient has symptomatically improved and reports dizziness almost resolved. Will continue current therapy and will see her back in 2 weeks  to re-evaluate.   Her updated medication list for this problem includes:    Antivert 25 Mg Tabs (Meclizine hcl) .SABRA... Take 1 tablet by mouth three times a day  Problem # 2:  HYPERTENSION (ICD-401.9) Again above the goal but she reports taking no medicines this morning. I am not sure if that would make it higher if she is otherwise taking it regularly but I advised her to check her BP again this week and to continue taking the medicines as perscribed. If her BP is consistently elevated I told her to call us  back sooner so that we can readjust the regimen. I will see her otherwise in 2 weeks.   Her updated medication list for this problem includes:    Hydrochlorothiazide  25 Mg Tabs (Hydrochlorothiazide ) .SABRA... Take 1 tablet by mouth once a day    Metoprolol  Tartrate 25 Mg Tabs (Metoprolol  tartrate) .SABRA... Take one tablet two times a day  BP today: 165/86 Prior BP: 156/97 (06/07/2008)  Prior 10 Yr Risk Heart Disease: 9 % (01/03/2007)  Labs Reviewed: K+: 3.8 (05/22/2008) Creat: : 0.85 (05/22/2008)   Chol: 161 (05/12/2007)   HDL: 49 (05/12/2007)    LDL: 90 (05/12/2007)   TG: 108 (05/12/2007)  Problem # 3:  HYPOTHYROIDISM (ICD-244.9) Continue current regimen.   Her updated medication list for this problem includes:    Levothyroxine  Sodium 50 Mcg Tabs (Levothyroxine  sodium) .SABRA... Take 1 tablet by mouth once a day  Labs Reviewed: TSH: 3.699 (05/22/2008)    Chol: 161 (05/12/2007)   HDL: 49 (05/12/2007)   LDL: 90 (05/12/2007)   TG: 108 (05/12/2007)  Problem # 4:  ANEMIA, IRON DEFICIENCY NOS (ICD-280.9) Patient was given slides before for evaluation but reports loosing it. I will give it to her agian this time and I told her to bring it with her next week.  Her updated medication list for this problem includes:    Ferrous Sulfate  325 (65 Fe) Mg Tabs (Ferrous sulfate ) .SABRA... Take one tablet three times a day  Orders: Hemoccult Cards (Take Home) (Hemoccult Cards)  Complete Medication List: 1)  Hydrochlorothiazide  25 Mg Tabs (Hydrochlorothiazide ) .... Take 1 tablet by mouth once a day 2)  Levothyroxine  Sodium 50 Mcg Tabs (Levothyroxine  sodium) .... Take 1 tablet by mouth once a day 3)  Cyclobenzaprine  Hcl 10 Mg Tabs (Cyclobenzaprine  hcl) .... At bedtime as needed 4)  Oscal 500/200 D-3 Tabs (Calcium -vitamin d tabs) .... Take 1 tablet by mouth two times a day 5)  Aspirin  81 Mg Tbec (Aspirin ) .... Take 1 tablet by mouth once a day 6)  Metoprolol  Tartrate 25 Mg Tabs (Metoprolol  tartrate) .... Take one tablet two times a day 7)  Ferrous Sulfate  325 (65 Fe) Mg Tabs (Ferrous sulfate ) .... Take one tablet three times a day 8)  Hydrocortisone 2.5 % Crea (Hydrocortisone) .... Apply two times a day as needed and after bowel movement. 9)  Hydrocortisone Acetate 25 Mg Supp (Hydrocortisone acetate) .... Apply two times a day as needed 10)  Antivert 25 Mg Tabs (Meclizine hcl) .... Take 1 tablet by mouth three times a day 11)  Vicodin 5-500 Mg Tabs (Hydrocodone -acetaminophen ) .... Take 1 tablet by mouth every 4 hours as needed for pain  Patient  Instructions: 1)  Please schedule a follow-up appointment in 2 weeks for BP check. 2)  Check your Blood Pressure regularly. If it is above 170: you should make an appointment.

## 2010-04-10 NOTE — Letter (Signed)
Summary: [facility]: Appt. Notice  [facility]: Appt. Notice   Imported By: Florinda Marker 05/14/2009 15:36:25  _____________________________________________________________________  External Attachment:    Type:   Image     Comment:   External Document

## 2010-04-10 NOTE — Letter (Signed)
 Summary: [facility]Reschedule [facility] Notice  [facility]dule [facility]   Imported By: Ronal Louder 02/05/2009 14:14:06  _____________________________________________________________________  External Attachment:    Type:   Image     Comment:   External Document

## 2010-04-10 NOTE — Assessment & Plan Note (Signed)
 Summary: CHECKUP/SB.   Vital Signs:  Patient profile:   55 year old female Height:      63 inches (160.02 cm) Weight:      273.3 pounds (124.23 kg) BMI:     48.59 Temp:     98.4 degrees F (36.89 degrees C) Pulse rate:   86 / minute BP sitting:   151 / 91  (right arm) BP standing:   140 / 93  Vitals Entered ByBETHA Dannie [name] NT II (May 22, 2008 3:05 PM) Is Patient Diabetic? No Pain Assessment Patient in pain? no      Nutritional Status BMI of > 30 = obese  Does patient need assistance? Functional Status Self care Ambulation Wheelchair    Primary Care Provider:  Inocente Lindsay MD   History of Present Illness: Nancy [name] is a 55 yo woman who is in today for follow up of her multiple medical problems. 1. HTN - Taking meds as directed.  2. Hypothyroidism - Taking synthroid  3. Anemia, microcytic - Not taking iron as directed.  4. Obesity - Trying to lose weight.  5. Rotator cuff injury, s/p surgery - Supposed to be going to therapy but not going due to cost.  6. Dizzy spells - Started 2 weeks ago. Happens when goes from lying in bed to sitting. However, sometimes just starts without any provocation. Lasts 5-10 minutes. Ears feel stopped up.   Preventive Screening-Counseling & Management     Smoking Status: never     Does Patient Exercise: no  Current Medications (verified): 1)  Hydrochlorothiazide  25 Mg Tabs (Hydrochlorothiazide ) .... Take 1 Tablet By Mouth Once A Day 2)  Levothyroxine  Sodium 50 Mcg Tabs (Levothyroxine  Sodium) .... Take 1 Tablet By Mouth Once A Day 3)  Cyclobenzaprine  Hcl 10 Mg Tabs (Cyclobenzaprine  Hcl) .... At Bedtime As Needed 4)  Oscal 500/200 D-3  Tabs (Calcium -Vitamin D Tabs) .... Take 1 Tablet By Mouth Two Times A Day 5)  Aspirin  81 Mg Tbec (Aspirin ) .... Take 1 Tablet By Mouth Once A Day 6)  Metoprolol  Tartrate 25 Mg Tabs (Metoprolol  Tartrate) .... Take One Tablet Two Times A Day 7)  Ferrous Sulfate  325 (65 Fe) Mg Tabs (Ferrous Sulfate ) .... Take  One Tablet Three Times A Day 8)  Anusol-Hc 2.5 % Crea (Hydrocortisone) .... Apply To Affected Area Two Times A Day As Needed For Irritation 9)  Anusol-Hc 25 Mg Supp (Hydrocortisone Acetate) .... Use As Directed 10)  Antivert 25 Mg Tabs (Meclizine Hcl) .... Take 1 Tablet By Mouth Three Times A Day 11)  Vicodin 5-500 Mg Tabs (Hydrocodone -Acetaminophen ) .... Take 1 Tablet By Mouth Every 4 Hours As Needed For Pain  Allergies (verified): 1)  ! * Amlodipine  2)  ! * Aleve 3)  ! * Cocunax  Review of Systems General:  Denies chills and fever. CV:  Denies chest pain or discomfort and palpitations. Resp:  Denies cough and sputum productive. GI:  Denies abdominal pain, change in bowel habits, nausea, and vomiting. GU:  Denies dysuria. Nancy:  Complains of joint pain; B. knee pain; L. shoulder pain. Derm:  Denies rash. Neuro:  Complains of sensation of room spinning; denies headaches, numbness, and weakness. Psych:  Denies anxiety and depression. Endo:  Denies polyuria.  Physical Exam  General:  alert and overweight-appearing.   Ears:  R ear normal and L ear normal.   Lungs:  normal respiratory effort and normal breath sounds.   Heart:  normal rate and regular rhythm.   Abdomen:  soft,  non-tender, and normal bowel sounds.   Msk:  knees with no joint swelling, no joint warmth, but decreased ROM, and joint tenderness on movement.   Neurologic:  alert & oriented X3, cranial nerves II-XII intact, strength normal in all extremities, finger-to-nose normal, and heel-to-shin normal. Gait slow but normal. Skin:  no rashes.   Psych:  Oriented X3, memory intact for recent and remote, normally interactive, not anxious appearing, and not depressed appearing.     Impression & Recommendations:  Problem # 1:  BENIGN PAROXYSMAL POSITIONAL VERTIGO (ICD-386.11) Pt complains of intermittent vertigo whihc is sudden in onset and self-limited. Her neuro exam, specifically her cerebellar exam, is wnl. She is not  orthostatic. When lying down, she became suddenly vertiginous. All c/w BPV. Will try meclinzine. Follow up in 2 weeks.  Her updated medication list for this problem includes:    Antivert 25 Mg Tabs (Meclizine hcl) .Nancy [name]... Take 1 tablet by mouth three times a day  Problem # 2:  HYPERTENSION (ICD-401.9) BP elevated today. However, pt's BP was well controlled on my previous visit with her (129/81). Will not add another med today with concern that might worsen vertigo. Will readdress at follow up visit in 2 weeks. Check BMET today to look at creatinine and K.  Her updated medication list for this problem includes:    Hydrochlorothiazide  25 Mg Tabs (Hydrochlorothiazide ) .Nancy [name]... Take 1 tablet by mouth once a day    Metoprolol  Tartrate 25 Mg Tabs (Metoprolol  tartrate) .Nancy [name]... Take one tablet two times a day  BP today: 151/91 Prior BP: 148/91 (01/27/2008)  Prior 10 Yr Risk Heart Disease: 9 % (01/03/2007)  Labs Reviewed: Creat: 0.79 (01/27/2008) Chol: 161 (05/12/2007)   HDL: 49 (05/12/2007)   LDL: 90 (05/12/2007)   TG: 108 (05/12/2007)  Problem # 3:  HYPOTHYROIDISM (ICD-244.9) Check TSH today. Adjust med if needed.  Her updated medication list for this problem includes:    Levothyroxine  Sodium 50 Mcg Tabs (Levothyroxine  sodium) .Nancy [name]... Take 1 tablet by mouth once a day  Orders: T-TSH ([id number])  Problem # 4:  ANEMIA, IRON DEFICIENCY NOS (ICD-280.9) Pt not taking iron. Once again stressed importance of doing this. Gave her stool cards as she failed to return last ones. Refused colonoscopy. Check Hgb today.  Her updated medication list for this problem includes:    Ferrous Sulfate  325 (65 Fe) Mg Tabs (Ferrous sulfate ) .Nancy [name]... Take one tablet three times a day  Orders: T-CBC No Diff ([id number])  Problem # 5:  HEMORRHOIDS (ICD-455.6) Sent in an Rx for Anusol HC cream and suppositories.   Problem # 6:  KNEE PAIN, CHRONIC (ICD-719.46) Pt requests pain meds. Gave Rx for as needed Vicodin. Signed  pain contract.  The following medications were removed from the medication list:    Percocet 5-325 Mg Tabs (Oxycodone -acetaminophen ) .Nancy [name]... Take 1-2 by mouth up to every 4-6 hours as needed for pain Her updated medication list for this problem includes:    Cyclobenzaprine  Hcl 10 Mg Tabs (Cyclobenzaprine  hcl) .Nancy [name]... At bedtime as needed    Aspirin  81 Mg Tbec (Aspirin ) .Nancy [name]... Take 1 tablet by mouth once a day    Vicodin 5-500 Mg Tabs (Hydrocodone -acetaminophen ) .Nancy [name]... Take 1 tablet by mouth every 4 hours as needed for pain  Problem # 7:  Preventive Health Care (ICD-V70.0) Pt did not get mammogram as she has had recent rotator cuff sgy and mammogram was too painful to shoulder.  Refuses colonoscopy. Sending home with stool cards.  States she had recent pap smear  at [facility]. Will try to get results.   Complete Medication List: 1)  Hydrochlorothiazide  25 Mg Tabs (Hydrochlorothiazide ) .... Take 1 tablet by mouth once a day 2)  Levothyroxine  Sodium 50 Mcg Tabs (Levothyroxine  sodium) .... Take 1 tablet by mouth once a day 3)  Cyclobenzaprine  Hcl 10 Mg Tabs (Cyclobenzaprine  hcl) .... At bedtime as needed 4)  Oscal 500/200 D-3 Tabs (Calcium -vitamin d tabs) .... Take 1 tablet by mouth two times a day 5)  Aspirin  81 Mg Tbec (Aspirin ) .... Take 1 tablet by mouth once a day 6)  Metoprolol  Tartrate 25 Mg Tabs (Metoprolol  tartrate) .... Take one tablet two times a day 7)  Ferrous Sulfate  325 (65 Fe) Mg Tabs (Ferrous sulfate ) .... Take one tablet three times a day 8)  Anusol-hc 2.5 % Crea (Hydrocortisone) .... Apply to affected area two times a day as needed for irritation 9)  Anusol-hc 25 Mg Supp (Hydrocortisone acetate) .... Use as directed 10)  Antivert 25 Mg Tabs (Meclizine hcl) .... Take 1 tablet by mouth three times a day 11)  Vicodin 5-500 Mg Tabs (Hydrocodone -acetaminophen ) .... Take 1 tablet by mouth every 4 hours as needed for pain  Other Orders: T-Basic Metabolic Panel  ([id number])  Patient Instructions: 1)  Please schedule a follow-up appointment in 2 weeks. 2)  The medicine for your dizziness is called Meclizine. Take 1 pill 3 times a day.  3)  DO NOT TAKE CYCLOBENZAPRINE  WHILE YOU ARE TAKING THE MECLIZINE.  4)  Start taking the iron pills at least 2 times a day.  5)  Do not forget to return your stool cards.  6)  We will call you with the results of your blood tests.  Prescriptions: VICODIN 5-500 MG TABS (HYDROCODONE -ACETAMINOPHEN ) Take 1 tablet by mouth every 4 hours as needed for pain  #30 x 1   Entered and Authorized by:   Nancy Brizeyda Holtmeyer MD   Signed by:   Nancy Lindsay MD on 05/22/2008   Method used:   Print then Give to Patient   RxID:   [id number] ANUSOL-HC 2.5 % CREA (HYDROCORTISONE) apply to affected area two times a day as needed for irritation  #1 large tube x 3   Entered and Authorized by:   Nancy Kadeja Granada MD   Signed by:   Nancy Lindsay MD on 05/22/2008   Method used:   Electronically to        [facility] [phone]* (retail)       [address]       [place], [place]  [address]       Ph: [phone]       Fax: [phone]   RxID:   [id number] FERROUS SULFATE  325 (65 FE) MG TABS (FERROUS SULFATE ) Take one tablet three times a day  #1 bottle x 3   Entered and Authorized by:   Nancy Raider Valbuena MD   Signed by:   Nancy Lindsay MD on 05/22/2008   Method used:   Electronically to        [facility] [phone]* (retail)       [address]       [place], [place]  [address]       Ph: [phone]       Fax: [phone]   RxID:   [id number] ANTIVERT 25 MG TABS (MECLIZINE HCL) Take 1 tablet by mouth three times a day  #90 x 3   Entered and Authorized by:   Nancy Tysean Vandervliet MD   Signed by:  Nancy Fae Blossom MD on 05/22/2008   Method used:   Electronically to        [facility] [phone]* (retail)       [address]       [place], [place]  [address]       Ph: [phone]       Fax: [phone]   RxID:    [id number] ANUSOL-HC 25 MG SUPP (HYDROCORTISONE ACETATE) Use as directed  #1 box x 3   Entered and Authorized by:   Nancy Gola Bribiesca MD   Signed by:   Nancy Lindsay MD on 05/22/2008   Method used:   Electronically to        [facility] [phone]* (retail)       [address]       [place], [place]  [address]       Ph: [phone]       Fax: [phone]   RxID:   [id number]

## 2010-04-10 NOTE — Progress Notes (Signed)
Summary: phone/gg  Phone Note Call from Patient   Caller: Patient Summary of Call: Pt called back about her C-PAP machine.  She received her last on in 2005 and can't get in touch with that company. She wants to use [facility] and needs a  "written Rx"  with amount of pressure written on it. Pt # [id number] Initial call taken by: Merrie Roof RN,  June 26, 2009 4:17 PM  Follow-up for Phone Call        We should try to figure out what pressure is she using on her Cpap machine currently and prescribe her a CPAP machine with similar settings. I was not able to call because of the odd hours. Follow-up by: Lars Mage MD,  June 27, 2009 2:10 AM  Additional Follow-up for Phone Call Additional follow up Details #1::        pt states her last pressure  setting was 18. You could also write Rx for CPAP with auto settings and 2 or 3 week download.  this way the machine will get an accurate reading of pt's pressure, they will send results to you and then you order the pressure setting.  Additional Follow-up by: Merrie Roof RN,  June 27, 2009 10:26 AM    Additional Follow-up for Phone Call Additional follow up Details #2::    I will leave the prescription for the same in the sample room. Ask the Patient to pick it up. Follow-up by: Lars Mage MD,  June 28, 2009 5:13 AM  Additional Follow-up for Phone Call Additional follow up Details #3:: Details for Additional Follow-up Action Taken: Reviewed chart this afternoon.  It looks like Ms. Keough was on CPAP at 18 cm H2O as far back as 2008 and she states this is her current setting.  The last clinic note does not document daytime somnulence, morning headaches, etc.  Thus, it appears that the patient's verbalized setting (18 cm H2O) is consistent with the previously documneted CPAP settinig and she is currently asymptomatic on that setting.  If Dr. Eben Burow is unable to write the prescription tonight I would be happy to write it tomorrow when I am in  clinic. Additional Follow-up by: Doneen Poisson MD,  July 01, 2009 2:51 PM  Received Rx from Dr Eben Burow Rx faxed to [facility] RN  July 02, 2009 8:54 AM

## 2010-04-10 NOTE — Miscellaneous (Signed)
Summary: [facility])  [facility]   Imported By: Shon Hough 12/26/2009 14:42:27  _____________________________________________________________________  External Attachment:    Type:   Image     Comment:   External Document

## 2010-04-10 NOTE — Miscellaneous (Signed)
Summary: Initial Summary For Pt.  Initial Summary For Pt.   Imported By: Florinda Marker 05/22/2009 13:56:39  _____________________________________________________________________  External Attachment:    Type:   Image     Comment:   External Document

## 2010-04-10 NOTE — Miscellaneous (Signed)
" °  Clinical Lists Changes  Observations: Added new observation of DIAB EYE EX: Results:Abnormal. Advanced age-related macular degeneration. Location :[facility] specialist Dr Edyth (11/25/2007 9:39)      Ophthalmology Exam  Procedure date:  11/25/2007  Findings:      Results:Abnormal. Advanced age-related macular degeneration. Location :[facility] specialist Dr Edyth   "

## 2010-04-10 NOTE — Miscellaneous (Signed)
 Summary: HIPAA Restrictions  HIPAA Restrictions   Imported By: Ronal Louder 01/18/2008 15:01:59  _____________________________________________________________________  External Attachment:    Type:   Image     Comment:   External Document

## 2010-04-10 NOTE — Progress Notes (Signed)
 Summary: vag bleeding/ hla  Phone Note Call from Patient   Summary of Call: pt calls stating she has gone through menopause but now has started to have very heavy vaginal bleeding...she is using appr 4 pads an hr and soaking through these, it was advised for her to go to [facility] and be evaluated but she refuses to do so and desires an appt here this pm, it was given for 1330 Initial call taken by: Sherrilyn Rhein RN,  December 27, 2008 9:17 AM  Follow-up for Phone Call        [facility]-but she adamantly refused. Follow-up by: Darnelle FORBES Lefevre MD,  December 27, 2008 10:02 AM     Appended Document: vag bleeding/ hla I called the patient up and asked her symptoms. she was still bleeding actively and using pads. She said that she will wait untill tom morning and if bleeding does not stop she will go to ER.

## 2010-04-10 NOTE — Miscellaneous (Signed)
" ° °  Clinical Lists Changes Her MRI showed: full thickess tear of the supraspinatus tendon, I will refer her to an orthopedic. Orders: Added new Referral order of Orthopedic Surgeon Referral (Ortho Surgeon) - Signed  "

## 2010-04-10 NOTE — Miscellaneous (Signed)
Summary: [facility]  [facility]   Imported By: Shon Hough 03/21/2010 14:46:12  _____________________________________________________________________  External Attachment:    Type:   Image     Comment:   External Document

## 2010-04-10 NOTE — Letter (Signed)
 Summary: [employer] Mob[employer]: Wheelechair  [employer] Mobility: Wheelechair   Imported By: Ronal Louder 02/01/2008 15:08:25  _____________________________________________________________________  External Attachment:    Type:   Image     Comment:   External Document

## 2010-04-10 NOTE — Miscellaneous (Signed)
 Summary: Medical Surgical Procedures  Medical Surgical Procedures   Imported By: Ronal Louder 07/11/2008 14:52:30  _____________________________________________________________________  External Attachment:    Type:   Image     Comment:   External Document

## 2010-04-10 NOTE — Assessment & Plan Note (Signed)
Summary: 2WK F/U/EST/VS   Vital Signs:  Patient profile:   55 year old female Height:      63 inches (160.02 cm) Weight:      266.0 pounds (120.91 kg) BMI:     47.29 Temp:     97.0 degrees F (36.11 degrees C) oral Pulse rate:   71 / minute BP sitting:   159 / 97  (left arm) Cuff size:   large  Vitals Entered By: Theotis Barrio NT II (April 29, 2009 10:31 AM) CC: LEFT SIDE PAIN FOR ABOUT 2 WEEKS, Is Patient Diabetic? No Pain Assessment Patient in pain? yes     Location: RIGHT SIDE Intensity: 7 Type: SHARP/CRAMP Onset of pain  ABOUT 2 WEEKS AGO Nutritional Status BMI of > 30 = obese  Have you ever been in a relationship where you felt threatened, hurt or afraid?No   Does patient need assistance? Functional Status Self care Comments LEFT SIDE PAIN FOR 2 WEEKS   Primary Care Provider:  Lars Mage MD  CC:  LEFT SIDE PAIN FOR ABOUT 2 WEEKS and .  History of Present Illness: [name] is a 55 year old woman with past medical history of HTN, Hypothyroidism, sjogren's syndrome and chronic back pain s/p fusion of her lumbar vertebrae in 1999 and chronic shoulder pain s/p repair of rotator cuff tear. patient complains of new onset crampy pain in her left side of belly for last 3 days. She says that its 10/10 at its worse and currently 10/10. Its radiating to his groin and a/w frequency of urination and chilld at night. She say that she will satrt her PT from March 7th. Also complains of some tingling sensations on the left side of her leg which is on and off. No other complaints at this time.   Depression History:      The patient denies a depressed mood most of the day and a diminished interest in her usual daily activities.         Preventive Screening-Counseling & Management  Alcohol-Tobacco     Alcohol drinks/day: 0     Smoking Status: never  Caffeine-Diet-Exercise     Does Patient Exercise: yes     Type of exercise: WALKING     Exercise (avg: min/session):      Times/week:   1-2  Problems Prior to Update: 1)  Postnasal Drip  (ICD-784.91) 2)  Acute Pharyngitis  (ICD-462) 3)  Benign Paroxysmal Positional Vertigo  (ICD-386.11) 4)  Macular Degeneration, Bilateral  (ICD-362.50) 5)  Hemorrhoids  (ICD-455.6) 6)  Constipation, Drug Induced  (ICD-564.09) 7)  Shoulder Pain, Left  (ICD-719.41) 8)  Family History Breast Cancer 1st Degree Relative <50  (ICD-V16.3) 9)  Lump or Mass in Breast  (ICD-611.72) 10)  Hypertension  (ICD-401.9) 11)  Hypothyroidism  (ICD-244.9) 12)  Dysfunctional Uterine Bleeding  (ICD-626.8) 13)  Anemia, Iron Deficiency Nos  (ICD-280.9) 14)  Knee Pain, Chronic  (ICD-719.46) 15)  Hyperplasia, Endometrial Nos  (ICD-621.30) 16)  Hx of Health Screening  (ICD-V70.0) 17)  Sleep Apnea  (ICD-780.57) 18)  Morbid Obesity  (ICD-278.01) 19)  Sicca Syndrome  (ICD-710.2) 20)  Back Pain  (ICD-724.5)  Medications Prior to Update: 1)  Levothyroxine Sodium 50 Mcg Tabs (Levothyroxine Sodium) .... Take 1 Tablet By Mouth Once A Day 2)  Aspirin 81 Mg Tbec (Aspirin) .... Take 1 Tablet By Mouth Once A Day 3)  Ferrous Sulfate 325 (65 Fe) Mg Tabs (Ferrous Sulfate) .... Take One Tablet Three Times A Day  4)  Vicodin 5-500 Mg Tabs (Hydrocodone-Acetaminophen) .... Take 1 Tablet By Mouth Every 4 Hours As Needed For Pain 5)  Lisinopril-Hydrochlorothiazide 20-25 Mg Tabs (Lisinopril-Hydrochlorothiazide) .... Take 1 Tablet By Mouth Once A Day 6)  Metoprolol Tartrate 25 Mg Tabs (Metoprolol Tartrate) .... Take 1 Tablet By Mouth Two Times A Day 7)  Amlodipine Besylate 10 Mg Tabs (Amlodipine Besylate) .... Take 1 Tablet By Mouth Once A Day 8)  Fluticasone Propionate 50 Mcg/act Susp (Fluticasone Propionate) .... 2 Sprays in Each Nostril Twice Daily 9)  Blood Pressure Monitor  Misc (Misc. Devices)  Current Medications (verified): 1)  Levothyroxine Sodium 50 Mcg Tabs (Levothyroxine Sodium) .... Take 1 Tablet By Mouth Once A Day 2)  Aspirin 81 Mg Tbec  (Aspirin) .... Take 1 Tablet By Mouth Once A Day 3)  Ferrous Sulfate 325 (65 Fe) Mg Tabs (Ferrous Sulfate) .... Take One Tablet Three Times A Day 4)  Vicodin 5-500 Mg Tabs (Hydrocodone-Acetaminophen) .... Take 1 Tablet By Mouth Every 4 Hours As Needed For Pain 5)  Lisinopril-Hydrochlorothiazide 20-25 Mg Tabs (Lisinopril-Hydrochlorothiazide) .... Take 1 Tablet By Mouth Twice A Day 6)  Metoprolol Tartrate 25 Mg Tabs (Metoprolol Tartrate) .... Take 1 Tablet By Mouth Two Times A Day 7)  Amlodipine Besylate 10 Mg Tabs (Amlodipine Besylate) .... Take 1 Tablet By Mouth Once A Day 8)  Fluticasone Propionate 50 Mcg/act Susp (Fluticasone Propionate) .... 2 Sprays in Each Nostril Twice Daily 9)  Blood Pressure Monitor  Misc (Misc. Devices) 10)  Ciprofloxacin Hcl 500 Mg Tabs (Ciprofloxacin Hcl) .... Take 1 Tablet By Mouth Two Times A Day  Allergies (verified): 1)  ! * Amlodipine 2)  ! * Aleve 3)  ! * Cocunax  Past History:  Past Medical History: Last updated: 08/08/2007 Primary Sjogren's syndrome with Sicca complex -  anti-Ro +; ANA>1:1280 in homogenpattern -  negativ dsDNA/ RF/ antiSmith/RNP/C3-4 comp/ La/ Jo1/ Sceleroderma/ centromere -  neg HIV/ ACE/ Hep B/C -  nl CXR 2/05 - Schirmer salivary gland test not done -  Symptom Rx; eye drops/ prednisone/ plaquenil -  Referral to [facility] rheum, [name] 3/07. Saw Dr. Jimmy Footman, but stopped 2/2 costs Back pain-chronic low back L4-L5 discectomy; 11/99. Degenerative thoracic spondylotic changes 4/07 Hypertension -  no LVH EKG 7/05 - nl M/C ratio 4/07 Morbid Obesity- ht unknown Endometrial hyperplasia stripe > 35mm->7MM; GYN referral and BX  pending?  Obstructive sleep apnea-severe 09/2003 RD 161 per hr./cpap 18 cwp Hypothyroidism-09/1998 Microcytic anemia-09/1998. Hgb 10.5/ MCV 76. Needs Ferritin to determine ACD vs IDA Menorrhagia-10/1998 Uterine bleeding Lower extremity edema -  echo EF 55-65% w/o evidence of Dias Dysfx -  M/C ratio nl  4/07 -  Hgb 10.5 4/07, MCV 76 Polyarthalgias (knee/back/angle)w/ dx of fibromyalgia? given by [facility] rheum -  Knee pain, chronic secondary to obesity, fibromyalgia,  and Sjogren's as per above CP 10/07 -  neg adenosine myoview  Past Surgical History: Last updated: 03/17/2006 Tonsillectomy, age 60 (65)  Family History: Last updated: 05/12/2007 Mom- 60's from ovarian cancer. HTN. Dad- 60's from brain cancer.  Sister died from breast cancer.  Family History Ovarian cancer Family History Breast cancer 1st degree relative <50  Social History: Last updated: 01/17/2008 Her daughter [name]) often comes with her to visits. She also has a Nurse, children's (whose name is "Stark Bray" as well).   Risk Factors: Alcohol Use: 0 (04/29/2009) Exercise: yes (04/29/2009)  Risk Factors: Smoking Status: never (04/29/2009)  Review of Systems      See HPI  Physical Exam  Additional Exam:  Gen: AOx3, Morbidly obese female on wheel chair, in no some distress due to back and holding her left flank Eyes: PERRL, EOMI ENT:MMM, No erythema noted in posterior pharynx Neck: No JVD, No LAP Chest: CTAB with  good respiratory effort CVS: regular rhythmic rate, NO M/R/G, S1 S2 normal Abdo: soft, distended due to fat, BS+x4, tenderness + over left flank region, no guarding/rigidity No hepatosplenomegaly EXT: No odema noted Neuro: Non focal, gait is normal Skin: no rashes noted.    Impression & Recommendations:  Problem # 1:  ACUTE CYSTITIS (ICD-595.0) Satrted antibiotics for 7 days in view of complicated cystitis treat ment as outpatient.  Her updated medication list for this problem includes:    Ciprofloxacin Hcl 500 Mg Tabs (Ciprofloxacin hcl) .Marland Kitchen... Take 1 tablet by mouth two times a day  Orders: T-Culture, Urine ([id number]) T-Urinalysis ([id number])  Encouraged to push clear liquids, get enough rest, and take acetaminophen as needed. To be seen in 10 days if no  improvement, sooner if worse.  Problem # 2:  HYPERTENSION (ICD-401.9) Assessment: Deteriorated I changed her regimen to HCTZ-Lisin 25-20 two times a day in view of her constantly high BP. her lasy K was 4.1 in september 2010. i will see her back in 2-3 weeks for a follow up on her BP and also check Bmet for K and Crt. Other consideration weas doubling the dose fo metoprolol. Her updated medication list for this problem includes:    Lisinopril-hydrochlorothiazide 20-25 Mg Tabs (Lisinopril-hydrochlorothiazide) .Marland Kitchen... Take 1 tablet by mouth twice a day    Metoprolol Tartrate 25 Mg Tabs (Metoprolol tartrate) .Marland Kitchen... Take 1 tablet by mouth two times a day    Amlodipine Besylate 10 Mg Tabs (Amlodipine besylate) .Marland Kitchen... Take 1 tablet by mouth once a day  Future Orders: T-Comprehensive Metabolic Panel ([id number]) ... 05/13/2009  BP today: 159/97 Prior BP: 185/93 (04/03/2009)  Prior 10 Yr Risk Heart Disease: 9 % (01/03/2007)  Labs Reviewed: K+: 4.1 (12/06/2008) Creat: : 0.80 (12/06/2008)   Chol: 173 (12/06/2008)   HDL: 45 (12/06/2008)   LDL: 107 (12/06/2008)   TG: 106 (12/06/2008)  Problem # 3:  ANEMIA, IRON DEFICIENCY NOS (ICD-280.9) continue current traetment. Her updated medication list for this problem includes:    Ferrous Sulfate 325 (65 Fe) Mg Tabs (Ferrous sulfate) .Marland Kitchen... Take one tablet three times a day  Hgb: 10.5 (05/22/2008)   Hct: 33.0 (05/22/2008)   Platelets: 256 (05/22/2008) RBC: 4.55 (05/22/2008)   RDW: 15.0 (05/22/2008)   WBC: 5.2 (05/22/2008) MCV: 72.5 (05/22/2008)   MCHC: 31.8 (05/22/2008) Ferritin: 88 (01/17/2008) TSH: 3.699 (05/22/2008)  Problem # 4:  Gynecological examination-routine (ICD-V72.31) I will refer her to the gyn clinic for evaluation of her DUB and also tp get a PAP smear.  Problem # 5:  HYPOTHYROIDISM (ICD-244.9) I will obtain a TSH when she comes for a visit the next time around. Her updated medication list for this problem includes:    Levothyroxine  Sodium 50 Mcg Tabs (Levothyroxine sodium) .Marland Kitchen... Take 1 tablet by mouth once a day  Labs Reviewed: TSH: 3.699 (05/22/2008)    Chol: 173 (12/06/2008)   HDL: 45 (12/06/2008)   LDL: 107 (12/06/2008)   TG: 106 (12/06/2008)  Problem # 6:  MORBID OBESITY (ICD-278.01) Dsicussed weight loss options and given information about lifestyle modifiocation classes. Ht: 63 (04/29/2009)   Wt: 266.0 (04/29/2009)   BMI: 47.29 (04/29/2009)  Problem # 7:  BACK PAIN (ICD-724.5) She will be starting her PT as  soon as march 7th and it is much better then what it was the last time around when I saw her. Her updated medication list for this problem includes:    Aspirin 81 Mg Tbec (Aspirin) .Marland Kitchen... Take 1 tablet by mouth once a day    Vicodin 5-500 Mg Tabs (Hydrocodone-acetaminophen) .Marland Kitchen... Take 1 tablet by mouth every 4 hours as needed for pain  Orders: Demerol Injection to 50 mg (Z6109)  Discussed use of moist heat or ice, modified activities, medications, and stretching/strengthening exercises. Back care instructions given. To be seen in 2 weeks if no improvement; sooner if worsening of symptoms.   Complete Medication List: 1)  Levothyroxine Sodium 50 Mcg Tabs (Levothyroxine sodium) .... Take 1 tablet by mouth once a day 2)  Aspirin 81 Mg Tbec (Aspirin) .... Take 1 tablet by mouth once a day 3)  Ferrous Sulfate 325 (65 Fe) Mg Tabs (Ferrous sulfate) .... Take one tablet three times a day 4)  Vicodin 5-500 Mg Tabs (Hydrocodone-acetaminophen) .... Take 1 tablet by mouth every 4 hours as needed for pain 5)  Lisinopril-hydrochlorothiazide 20-25 Mg Tabs (Lisinopril-hydrochlorothiazide) .... Take 1 tablet by mouth twice a day 6)  Metoprolol Tartrate 25 Mg Tabs (Metoprolol tartrate) .... Take 1 tablet by mouth two times a day 7)  Amlodipine Besylate 10 Mg Tabs (Amlodipine besylate) .... Take 1 tablet by mouth once a day 8)  Fluticasone Propionate 50 Mcg/act Susp (Fluticasone propionate) .... 2 sprays in each nostril twice  daily 9)  Blood Pressure Monitor Misc (Misc. devices) 10)  Ciprofloxacin Hcl 500 Mg Tabs (Ciprofloxacin hcl) .... Take 1 tablet by mouth two times a day  Other Orders: Gynecologic Referral (Gyn) Mammogram (Screening) (Mammo)  Patient Instructions: 1)  Please schedule a follow-up appointment in 2 weeks. 2)  Please schedule a follow-up appointment as needed. 3)  It is important that you exercise regularly at least 20 minutes 5 times a week. If you develop chest pain, have severe difficulty breathing, or feel very tired , stop exercising immediately and seek medical attention. 4)  You need to lose weight. Consider a lower calorie diet and regular exercise.  5)  Schedule your mammogram. 6)  You need to have a Pap Smear to prevent cervical cancer. 7)  Check your Blood Pressure regularly. If it is above:140/90 you should make an appointment. 8)  BMP prior to visit, ICD-9: 9)  Take your antibiotic as prescribed until ALL of it is gone, but stop if you develop a rash or swelling and contact our office as soon as possible. Prescriptions: CIPROFLOXACIN HCL 500 MG TABS (CIPROFLOXACIN HCL) Take 1 tablet by mouth two times a day  #14 x 0   Entered and Authorized by:   Lars Mage MD   Signed by:   Lars Mage MD on 04/29/2009   Method used:   Electronically to        [id number]* (retail)       [address]       [place], [place]  [address]       Ph: [phone]       Fax: [phone]   RxID:   [id number] LISINOPRIL-HYDROCHLOROTHIAZIDE 20-25 MG TABS (LISINOPRIL-HYDROCHLOROTHIAZIDE) Take 1 tablet by mouth twice a day  #60 x 11   Entered and Authorized by:   Lars Mage MD   Signed by:   Lars Mage MD on 04/29/2009   Method used:   Electronically to        [facility]  [address] [phone]* (retail)       [address]       [place], [place]  [address]       Ph: [phone]       Fax: [phone]   RxID:   [id number]  Process Orders Check Orders Results:     [facility]: Check successful Tests Sent for requisitioning (April 29, 2009 11:48 AM):     04/29/2009: [facility] -- T-Culture, Urine [[id number]] (signed)     04/29/2009: [facility] -- T-Urinalysis [[id number]] (signed)     05/13/2009: [facility] -- T-Comprehensive Metabolic Panel [id number] (signed)    Prevention & Chronic Care Immunizations   Influenza vaccine: Not documented   Influenza vaccine deferral: Deferred  (04/29/2009)    Tetanus booster: Not documented   Td booster deferral: Refused  (04/03/2009)    Pneumococcal vaccine: Not documented   Pneumococcal vaccine deferral: Not indicated  (01/17/2009)  Colorectal Screening   Hemoccult: Not documented   Hemoccult action/deferral: Not indicated  (01/17/2009)    Colonoscopy: Not documented   Colonoscopy action/deferral: Refused  (04/29/2009)  Other Screening   Pap smear:  Specimen Adequacy: Satisfactory for evaluation.   Interpretation/Result:Negative for intraepithelial Lesion or Malignancy.     (08/26/2006)   Pap smear action/deferral: GYN Referral  (04/29/2009)   Pap smear due: 09/2009    Mammogram: Assessment: BIRADS 1.  The appearance of both breasts is similar to the prior mammogram. Scattered benign-appearing calcifications in both  breasts.  No evidence of mass, suspicious microcalcification or architectural distortion in either breast.   IMPRESSION:   No evidence of malignancy in either breast.   THIS PROCEDURE WAS A DIGITAL  MAMMOGRAM   Read By:  Oliver Hum,  M.D.     Released By:  Oliver Hum,  M.D.  (12/07/2006)   Mammogram action/deferral: Ordered  (04/29/2009)   Mammogram due: 12/2007   Smoking status: never  (04/29/2009)  Lipids   Total Cholesterol: 173  (12/06/2008)   LDL: 107  (12/06/2008)   LDL Direct: Not documented   HDL: 45  (12/06/2008)   Triglycerides: 106  (12/06/2008)  Hypertension   Last Blood Pressure:  159 / 97  (04/29/2009)   Serum creatinine: 0.80  (12/06/2008)   Serum potassium 4.1  (12/06/2008) CMP ordered     Hypertension flowsheet reviewed?: Yes   Progress toward BP goal: Deteriorated  Self-Management Support :   Personal Goals (by the next clinic visit) :      Personal blood pressure goal: 140/90  (01/17/2009)   Patient will work on the following items until the next clinic visit to reach self-care goals:     Medications and monitoring: take my medicines every day  (04/29/2009)     Eating: eat more vegetables, use fresh or frozen vegetables, eat baked foods instead of fried foods, limit or avoid alcohol  (04/29/2009)    Hypertension self-management support: Written self-care plan, Education handout  (04/29/2009)   Hypertension self-care plan printed.   Hypertension education handout printed   Nursing Instructions: Give tetanus booster today Gyn referral for screening Pap (see order) Schedule screening mammogram (see order)   Appended Document: Tetanus and Demerol inj     Clinical Lists Changes  Orders: Added new Service order of Admin of Therapeutic Inj  intramuscular or subcutaneous (87564) - Signed Added new Service order of Demerol  100mg   Injection (P3295) - Signed Added new Service order of TD Toxoids IM 7 YR + (18841) -  Signed Added new Service order of Admin 1st Vaccine (29562) - Signed Observations: Added new observation of TD BOOST VIS: 01/25/07 version given April 29, 2009. (04/29/2009 12:33) Added new observation of TD BOOSTERLO: [id number] (04/29/2009 12:33) Added new observation of TD BOOST EXP: 01/22/2011 (04/29/2009 12:33) Added new observation of TD BOOSTERBY: Tomasita Morrow RN (04/29/2009 12:33) Added new observation of TD BOOSTERRT: IM (04/29/2009 12:33) Added new observation of TDBOOSTERDSE: 0.5 ml (04/29/2009 12:33) Added new observation of TD BOOSTERMF: Sanofi Pasteur (04/29/2009 12:33) Added new observation of TD BOOST SIT: left deltoid  (04/29/2009 12:33) Added new observation of TD BOOSTER: Td (04/29/2009 12:33)       Medication Administration  Injection # 1:    Medication: Demerol  100mg   Injection    Diagnosis: BACK PAIN (ICD-724.5)    Route: IM    Site: RUOQ gluteus    Exp Date: 05/08/2011    Lot #: [id number]    Mfr: Hospira    Comments: Demerol 50mg  given IM right gluteus.     Patient tolerated injection without complications    Given by: Tomasita Morrow RN (April 29, 2009 12:38 PM)  Orders Added: 1)  Admin of Therapeutic Inj  intramuscular or subcutaneous [96372] 2)  Demerol  100mg   Injection [J2175] 3)  TD Toxoids IM 7 YR + [90714] 4)  Admin 1st Vaccine [90471]    Immunizations Administered:  Tetanus Vaccine:    Vaccine Type: Td    Site: left deltoid    Mfr: Sanofi Pasteur    Dose: 0.5 ml    Route: IM    Given by: Tomasita Morrow RN    Exp. Date: 01/22/2011    Lot #: [id number]    VIS given: 01/25/07 version given April 29, 2009.
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
## 2010-04-10 NOTE — Miscellaneous (Signed)
Summary: [facility]  [facility]   Imported By: Margie Billet 04/04/2010 16:51:07  _____________________________________________________________________  External Attachment:    Type:   Image     Comment:   External Document

## 2010-04-10 NOTE — Assessment & Plan Note (Signed)
 Summary: SHOULDER/ BACK/ (PHIFER) SB.   Vital Signs:  Patient Profile:   55 Years Old Female Height:     63 inches (160.02 cm) Weight:      277.1 pounds (125.95 kg) BMI:     49.26 Temp:     98.0 degrees F (36.67 degrees C) oral Pulse rate:   68 / minute BP sitting:   139 / 83  (right arm)  Pt. in pain?   yes    Location:   ltshoulder/back    Intensity:   5/8    Type:       sharp  Vitals Entered By: Gaetana Pan RN (November 02, 2007 9:41 AM)              Is Patient Diabetic? No Nutritional Status BMI of > 30 = obese  Have you ever been in a relationship where you felt threatened, hurt or afraid?Unable to ask; family w/pt   Does patient need assistance? Functional Status Self care, Cook/clean, Shopping, Social activities Ambulation Impaired:Risk for fall Comments using a w/c     PCP:  Inocente Lindsay MD  Chief Complaint:  Lt shoulder pain x 2month; felled into the door.  History of Present Illness:  55 year old With PMH outlined on chart Who comes complaining of right side shoulder pain. She trip and felled  into wall 1 month ago. She describe shoulder pain as sharp, 5/10 in intensity. She feels that pain is getting worse. She is not taking anything for pain. She is also complaining of her chronic  back pain. She denies dyspnea, chest painor fever.    Current Allergies: ! * AMLODIPINE  ! * ALEVE ! * COCUNAX    Risk Factors: Tobacco use:  never Alcohol use:  no Exercise:  no Seatbelt use:  100 %  Mammogram History:    Date of Last Mammogram:  12/07/2006  PAP Smear History:    Date of Last PAP Smear:  11/18/2004   Review of Systems  The patient denies fever, hoarseness, syncope, dyspnea on exertion, peripheral edema, prolonged cough, headaches, hemoptysis, abdominal pain, melena, hematochezia, and severe indigestion/heartburn.     Physical Exam  General:     alert and well-developed.   Head:     normocephalic, atraumatic, and no abnormalities observed.    Lungs:     normal respiratory effort, no intercostal retractions, no accessory muscle use, and normal breath sounds.   Heart:     normal rate, regular rhythm, and no murmur.   Abdomen:     soft, non-tender, and normal bowel sounds.   Msk:     no joint swelling, no joint warmth, no redness over joints, and no joint deformities.  Right shoulder pain on palpation., no swelling, mild movement limitation.  Extremities:     no edema.    Impression & Recommendations:  Problem # 1:  HYPOTHYROIDISM (ICD-244.9) Prior TSH was at 2.982 on 10- 27-2008. I will get TSH today and adjust as needed levothyroxine .  Her updated medication list for this problem includes:    Levothyroxine  Sodium 50 Mcg Tabs (Levothyroxine  sodium) .SABRA... Take 1 tablet by mouth once a day  Orders: T-TSH ([id number])   Problem # 2:  SHOULDER PAIN, LEFT (ICD-719.41) This is likely muscle-skeletal pain secondary to trauma. I wil get xray to rule out fracture,she is 55 year old, and  on prednisone . I will prescribe vicodin for pain.  Her co-payment for vicodin  is less than with darvocet. If  x- ray is  negative for fracture I will refer her to PT. Her updated medication list for this problem includes:    Vicodin 5-500 Mg Tabs (Hydrocodone -acetaminophen ) .SABRA... Take 1 tablet every 6 hour as needed.    Cyclobenzaprine  Hcl 10 Mg Tabs (Cyclobenzaprine  hcl) .SABRA... At bedtime as needed    Aspirin  81 Mg Tbec (Aspirin ) .SABRA... Take 1 tablet by mouth once a day   Problem # 3:  HYPERTENSION (ICD-401.9) Blood pressure good control. continue current regimen. Prior Bmet done on march 2009, with Cr at 0.76 and K at 4.0.  Her updated medication list for this problem includes:    Hydrochlorothiazide  25 Mg Tabs (Hydrochlorothiazide ) .SABRA... Take 1 tablet by mouth once a day    Metoprolol  Tartrate 25 Mg Tabs (Metoprolol  tartrate) .SABRA... Take one tablet two times a day   Problem # 4:  Hx of HEALTH SCREENING (ICD-V70.0) She will need mammogram  on Octuber 2009.   Complete Medication List: 1)  Vicodin 5-500 Mg Tabs (Hydrocodone -acetaminophen ) .... Take 1 tablet every 6 hour as needed. 2)  Hydrochlorothiazide  25 Mg Tabs (Hydrochlorothiazide ) .... Take 1 tablet by mouth once a day 3)  Levothyroxine  Sodium 50 Mcg Tabs (Levothyroxine  sodium) .... Take 1 tablet by mouth once a day 4)  Cyclobenzaprine  Hcl 10 Mg Tabs (Cyclobenzaprine  hcl) .... At bedtime as needed 5)  Oscal 500/200 D-3 Tabs (Calcium -vitamin d tabs) .... Take 1 tablet by mouth two times a day 6)  Aspirin  81 Mg Tbec (Aspirin ) .... Take 1 tablet by mouth once a day 7)  Metoprolol  Tartrate 25 Mg Tabs (Metoprolol  tartrate) .... Take one tablet two times a day 8)  Ferrous Sulfate  325 (65 Fe) Mg Tabs (Ferrous sulfate ) .... Take one tablet three times a day 9)  Prednisone  2.5 Mg Tabs (Prednisone ) .... Take 1 tablet by mouth once a day  Other Orders: Radiology other (Radiology Other)   Patient Instructions: 1)  Please schedule a follow-up appointment in 1 month with your PCP.   Prescriptions: VICODIN 5-500 MG TABS (HYDROCODONE -ACETAMINOPHEN ) take 1 tablet every 6 hour as needed.  #90 x 0   Entered and Authorized by:   Owen Lore MD   Signed by:   Owen Lore MD on 11/02/2007   Method used:   Print then Give to Patient   RxID:   [id number] DARVOCET-N 100 100-650 MG TABS (PROPOXYPHENE N-APAP) Take one tablet every six hours as needed for pain  #120 x 0   Entered and Authorized by:   Owen Lore MD   Signed by:   Owen Lore MD on 11/02/2007   Method used:   Print then Give to Patient   RxID:   [id number]  ]

## 2010-04-10 NOTE — Letter (Signed)
 Summary: [facility]  [facility]   Imported By: Ronal Louder 01/30/2008 16:38:30  _____________________________________________________________________  External Attachment:    Type:   Image     Comment:   External Document

## 2010-04-10 NOTE — Miscellaneous (Signed)
 Summary: [facility]: Order  [facility]: Order   Imported By: Ronal Louder 02/07/2008 12:10:44  _____________________________________________________________________  External Attachment:    Type:   Image     Comment:   External Document

## 2010-04-10 NOTE — Miscellaneous (Signed)
 Summary: [facility]: CMN  [facility]: CMN   Imported By: Ronal Louder 02/07/2008 14:36:54  _____________________________________________________________________  External Attachment:    Type:   Image     Comment:   External Document

## 2010-04-10 NOTE — Progress Notes (Signed)
 Summary: Powered Wheel Battery Request  Phone Note Call from Patient Call back at [facility]    Caller: Nurse Karey) [facility] Reason for Call: Talk to Doctor, Insurance Question Summary of Call: Allean from [facility] called stating that she had sent a request for a renewal for a battery for this pt's powered wheelchair.  She states, that it will be cheaper for the physician to sign off for a new battery for her wheelchair then for Medicare to purchase another one.  She has given her direct phone number [phone] if you would like to contact her.  The orignal copy of the wheelchair order from [facility] is on file dated 09/10/2004 in the paperchart if verification is needed. Initial call taken by: Brunetta Stager,  February 09, 2007 11:18 AM Caller: Nurse  Follow-up for Phone Call        I signed this some time ago (about 1-2 mos ago). I approved the battery at that time. Does it need to be signed again? Follow-up by: Rolene Cleveland MD,  March 08, 2007 9:40 AM  Additional Follow-up for Phone Call Additional follow up Details #1::        The one that was scanned still says denied and has not been signed.  I will put another copy in your box for you to sign. Additional Follow-up by: Brunetta Stager,  March 08, 2007 10:26 AM    Additional Follow-up for Phone Call Additional follow up Details #2::    Ok thanks. ..................................................................SABRARolene Cleveland MD  March 08, 2007 10:34 AM

## 2010-04-10 NOTE — Assessment & Plan Note (Signed)
 Summary: EST-CK/FU/MEDS/CFB   Vital Signs:  Patient Profile:   55 Years Old Female Height:     63 inches Weight:      284.0 pounds BMI:     50.49 Temp:     97.8 degrees F oral Pulse rate:   68 / minute BP sitting:   141 / 80  (left arm) Cuff size:   large  Pt. in pain?   yes    Location:   back    Intensity:   8    Type:       aching  Vitals Entered ByBETHA Ivin Holland (Jul 29, 2006 9:14 AM)              Is Patient Diabetic? No Nutritional Status obese  Does patient need assistance? Functional Status Self care Ambulation Normal Comments patient walks with cane at home / patient says she has a wheelchair at home.   Chief Complaint:  back Pain / headaches in the morning /  ? hot flashes /?  about B/P machine ? Rx / dischare with odor and thinks new med. is causing this..  History of Present Illness: Nancy [name] is having headaches for 2 wks. Having itching and malodorous urine. She states that she first noticed it when she started using Metoprolol .   She is also complaining having hot flashes and has not had a period since January. Before then, she was having metorrhagia and menorrhagia.   Hypertension History:      Positive major cardiovascular risk factors include hypertension.  Negative major cardiovascular risk factors include female age less than 64 years old and non-tobacco-user status.     Current Allergies: ! * AMLODIPINE  ! * ALEVE ! * COCUNAX    Risk Factors:  Tobacco use:  never Alcohol use:  no Exercise:  no Seatbelt use:  100 %  Mammogram History:    Date of Last Mammogram:  11/06/2002  PAP Smear History:    Date of Last PAP Smear:  11/18/2004    Physical Exam  General:     overweight-appearing.  She is in mild distress and is occassionally stopping because of back (low) pain and left leg cramps. Lungs:     Normal respiratory effort, chest expands symmetrically. Lungs are clear to auscultation, no crackles or wheezes. Heart:     Normal  rate and regular rhythm. S1 and S2 normal without gallop, murmur, click, rub or other extra sounds. Msk:     She has Thoracolumbar spinal tenderness. She has pain over her left knee. She has non tender joints in her hands/wrists/ elbows/ shoulders. Her ankle is swollen but non-tender. R knee is non-tender.  Extremities:     Bilateral LE edema particularly at the ankles.     Impression & Recommendations:  Problem # 1:  EDEMA LEG (ICD-782.3) Not bad today. She has some ankle edema, but not as bad as before. Continue current meds.  Her updated medication list for this problem includes:    Hydrochlorothiazide  25 Mg Tabs (Hydrochlorothiazide ) .SABRA... Take 1 tablet by mouth once a day Discussed elevation of the legs, and medication use.   Problem # 2:  DYSFUNCTIONAL UTERINE BLEEDING (ICD-626.8) Now saying that she is not having further menstrual cycles since January. I wonder about perimenopausal symptoms. She did have a thickened endometrial stripe on the last ultrasound. She never kept her GYN appointment. I will send her back there to followup. Even though she is no longer having bleeding, we need to determine if she  still needs to have an endometrial biopsy. Orders: Gynecologic Referral (Gyn)   Problem # 3:  HYPOTHYROIDISM (ICD-244.9) Check TSH today. Her updated medication list for this problem includes:    Levothyroxine  Sodium 50 Mcg Tabs (Levothyroxine  sodium) .SABRA... Take 1 tablet by mouth once a day  Orders: T-TSH ([id number])   Problem # 4:  SLEEP APNEA (ICD-780.57) CPAP (cm H20):   18 CWP    Problem # 5:  HYPERPLASIA, ENDOMETRIAL NOS (ICD-621.30) As per above. She needs to keep her GYN appt. Send back for further w/u.   Problem # 6:  MORBID OBESITY (ICD-278.01) Not discussed at length today. Her BMI is 50. It is difficult for her to exercise when she is in pain. Encourage exercise as tolerated, and work on pain control from her joint pain.  Problem # 7:  HYPERTENSION  (ICD-401.9) She stoppped Lisinopril  because of cough. She is on a diuretic and BB. Continue current meds for now. She has pain today which could explain some of the elevation of her BP. Reassess her BP on the next visit. Consider adding an agent if it remains elevated such as a calcium  channel blocker or an ARB.  The following medications were removed from the medication list:    Lisinopril  20 Mg Tabs (Lisinopril ) .SABRA... Take 1 tablet by mouth once a day  Her updated medication list for this problem includes:    Hydrochlorothiazide  25 Mg Tabs (Hydrochlorothiazide ) .SABRA... Take 1 tablet by mouth once a day    Metoprolol  Tartrate 25 Mg Tabs (Metoprolol  tartrate) .SABRA... Take one tablet two times a day  BP today: 141/80   Problem # 8:  SICCA SYNDROME (ICD-710.2) She has not followed up with Dr. Romilda of [facility]. She will continue her Prednisone  2.5 mg daily. She says her symptoms are better. She has not been taking her Plaquenil . It may help with the rest of her joint pain which is present today. I will resume this medicine, and have her f/u again with Dr. Raebourne. While on Plaquenil , she needs to maintain yearly eye exams.  Problem # 9:  DYSURIA (ICD-788.1) Check her urine today. Consider wet prep (she also has symptoms of itching/ discharge?). Pt refused today and I was unable to ruled out BV or candida vaginitis. It was difficult to determine if she was complaining of malodorous urine or vaginal discharge.  Orders: T-Urinalysis Dipstick only ([id number])   Problem # 10:  BACK PAIN (ICD-724.5) She has generalized joint pain and does continue to have some lumbar pain. As per above, continue to treat SICCA syndrome for now.  Her updated medication list for this problem includes:    Darvocet-n 100 100-650 Mg Tabs (Propoxyphene n-apap) .SABRA... Take one tablet every six hours as needed for pain    Cyclobenzaprine  Hcl 10 Mg Tabs (Cyclobenzaprine  hcl) .SABRA... At bedtime as needed    Aspirin  81 Mg Tbec  (Aspirin ) .SABRA... Take 1 tablet by mouth once a day   Medications Added to Medication List This Visit: 1)  Oscal 500/200 D-3 Tabs (Calcium -vitamin d tabs) .... Take 1 tablet by mouth two times a day 2)  Plaquenil  200 Mg Tabs (Hydroxychloroquine  sulfate) .... Take 1 tablet by mouth two times a day  Other Orders: T-Comprehensive Metabolic Panel [phone]) T-Lipid Profile ([id number]) T-CBC No Diff ([id number])  Hypertension Assessment/Plan:      The patient's hypertensive risk group is category A: No risk factors and no target organ damage.  Today's blood pressure is 141/80.     Patient  Instructions: 1)  Please schedule a follow-up appointment in 3 months. 2)  Please schedule an appointment with [facility].  3)  Please schedule an appointment with your gynecologist.  4)  Schedule your mammogram. 5)  It is important that you exercise regularly at least 20 minutes 5 times a week. If you develop chest pain, have severe difficulty breathing, or feel very tired , stop exercising immediately and seek medical attention. 6)  Take calcium  +Vitamin D daily. Prescriptions: OSCAL 500/200 D-3  TABS (CALCIUM -VITAMIN D TABS) Take 1 tablet by mouth two times a day  #62 x 5   Entered and Authorized by:   Rolene Cleveland MD   Signed by:   Rolene Cleveland MD on 07/29/2006   Method used:   Print then Give to Patient   RxID:   [id number] PLAQUENIL  200 MG TABS (HYDROXYCHLOROQUINE  SULFATE) Take 1 tablet by mouth two times a day  #62 x 5   Entered and Authorized by:   Rolene Cleveland MD   Signed by:   Rolene Cleveland MD on 07/29/2006   Method used:   Print then Give to Patient   RxID:   [id number] METOPROLOL  TARTRATE 25 MG TABS (METOPROLOL  TARTRATE) take one tablet two times a day  #62 x 5   Entered and Authorized by:   Rolene Cleveland MD   Signed by:   Rolene Cleveland MD on 07/29/2006   Method used:   Print then Give to Patient   RxID:    [id number] CYCLOBENZAPRINE  HCL 10 MG TABS (CYCLOBENZAPRINE  HCL) at bedtime as needed  #31 x 3   Entered and Authorized by:   Rolene Cleveland MD   Signed by:   Rolene Cleveland MD on 07/29/2006   Method used:   Print then Give to Patient   RxID:   [id number] PREDNISONE  2.5 MG TABS (PREDNISONE ) Take 1 tablet by mouth once a day  #31 x 3   Entered and Authorized by:   Rolene Cleveland MD   Signed by:   Rolene Cleveland MD on 07/29/2006   Method used:   Print then Give to Patient   RxID:   [id number] LEVOTHYROXINE  SODIUM 50 MCG TABS (LEVOTHYROXINE  SODIUM) Take 1 tablet by mouth once a day  #30 x 5   Entered and Authorized by:   Rolene Cleveland MD   Signed by:   Rolene Cleveland MD on 07/29/2006   Method used:   Print then Give to Patient   RxID:   [id number] HYDROCHLOROTHIAZIDE  25 MG TABS (HYDROCHLOROTHIAZIDE ) Take 1 tablet by mouth once a day  #30 x 5   Entered and Authorized by:   Rolene Cleveland MD   Signed by:   Rolene Cleveland MD on 07/29/2006   Method used:   Print then Give to Patient   RxID:   [id number]    Laboratory Results   Urine Tests  Date/Time Recieved: Jul 29, 2006 11:09 AM  Date/Time Reported: ................................................................[place]  Jul 29, 2006 11:09 AM   Routine Urinalysis   Color: yellow Appearance: Clear Glucose: negative   (Normal Range: Negative) Bilirubin: negative   (Normal Range: Negative) Ketone: negative   (Normal Range: Negative) Spec. Gravity: 1.015   (Normal Range: 1.003-1.035) Blood: negative   (Normal Range: Negative) pH: 5.5   (Normal Range: 5.0-8.0) Protein: negative   (Normal Range: Negative) Urobilinogen: 0.2   (Normal Range: 0-1) Nitrite: negative   (Normal Range: Negative) Leukocyte Esterace: negative   (Normal Range: Negative)

## 2010-04-10 NOTE — Miscellaneous (Signed)
Summary: Medical Modalities: CMN  Medical Modalities: CMN   Imported By: Florinda Marker 04/08/2009 09:48:46  _____________________________________________________________________  External Attachment:    Type:   Image     Comment:   External Document

## 2010-04-10 NOTE — Assessment & Plan Note (Signed)
Summary: ACUTE/GARG/10 DAYS F/U VISIT/CH   Vital Signs:  Patient profile:   55 year old female Height:      63 inches (160.02 cm) Weight:      264.5 pounds (120.23 kg) BMI:     47.02 Temp:     99.0 degrees F (37.22 degrees C) oral Pulse rate:   76 / minute BP sitting:   172 / 97  (left arm)  Vitals Entered By: Stanton Kidney Ditzler RN (August 26, 2009 10:16 AM) Is Patient Diabetic? No Pain Assessment Patient in pain? yes     Location: ribs Intensity: 8 Type: aching Onset of pain  cont since last visit Nutritional Status BMI of > 30 = obese Nutritional Status Detail appetite fair  Have you ever been in a relationship where you felt threatened, hurt or afraid?denies   Does patient need assistance? Functional Status Self care Ambulation Wheelchair Comments Daughter with pt. FU - ribs still sore - sl better. Results US - uterus. BP done 10:40AM left arm 156/85 pulse  76.   Primary Care Provider:  Lars Mage MD   History of Present Illness: Nancy [name] comes for f/u.   1. Cough: It has improved but she still has some. No fever/chills. She still has some sputum.   2. HTN: She is taking her lisinopirl/HCTZ and norvasc.   3. Anemia: She was scheduled for colonoscopy and was not able to go for the appt as she couldnot afford it. However, she is working on that for now. She still has her periods off/on.   Depression History:      The patient denies a depressed mood most of the day and a diminished interest in her usual daily activities.         Preventive Screening-Counseling & Management  Alcohol-Tobacco     Alcohol drinks/day: 0     Smoking Status: never  Caffeine-Diet-Exercise     Does Patient Exercise: yes     Type of exercise: WALKING     Exercise (avg: min/session):     Times/week:   1-2  Current Medications (verified): 1)  Levothyroxine Sodium 50 Mcg Tabs (Levothyroxine Sodium) .... Take 1 Tablet By Mouth Once A Day 2)  Aspirin 81 Mg Tbec (Aspirin) .... Take 1  Tablet By Mouth Once A Day 3)  Ferrous Sulfate 325 (65 Fe) Mg Tabs (Ferrous Sulfate) .... Take One Tablet Three Times A Day 4)  Vicodin 5-500 Mg Tabs (Hydrocodone-Acetaminophen) .... Take 1 Tablet By Mouth Every 4 Hours As Needed For Pain 5)  Lisinopril-Hydrochlorothiazide 20-25 Mg Tabs (Lisinopril-Hydrochlorothiazide) .... Take 1 Tablet By Mouth Twice A Day 6)  Metoprolol Tartrate 25 Mg Tabs (Metoprolol Tartrate) .... Take 1 Tablet By Mouth Two Times A Day 7)  Amlodipine Besylate 10 Mg Tabs (Amlodipine Besylate) .... Take 1 Tablet By Mouth Once A Day 8)  Fluticasone Propionate 50 Mcg/act Susp (Fluticasone Propionate) .... 2 Sprays in Each Nostril Twice Daily 9)  Blood Pressure Monitor  Misc (Misc. Devices) 10)  Dex-Tuss 300-10 Mg/48ml Liqd (Guaifenesin-Codeine) .... Take 5 Ml Every 6 Hourly Until Your Cough Resolves.  Allergies: 1)  ! * Amlodipine 2)  ! * Aleve 3)  ! * Coconut  Review of Systems      See HPI   Impression & Recommendations:  Problem # 1:  COUGH (ICD-786.2) Improving, but still has some cough. The rib pain is imroving as well, but she still has some. Plan is to use the cough expectorant for now and f/u.  Problem # 2:  HYPERTENSION (ICD-401.9) BP elevated than last time. Repeat BP was 156/85. She is still coughing some and has pain in her ribs and this is contributing to her pain. Will f/u in 2 wks and if her BP is still elevated when her cough and pain is resolved, we may need to add another drug or replace metoprolol with coreg.  Her updated medication list for this problem includes:    Lisinopril-hydrochlorothiazide 20-25 Mg Tabs (Lisinopril-hydrochlorothiazide) .Marland Kitchen... Take 1 tablet by mouth twice a day    Metoprolol Tartrate 25 Mg Tabs (Metoprolol tartrate) .Marland Kitchen... Take 1 tablet by mouth two times a day    Amlodipine Besylate 10 Mg Tabs (Amlodipine besylate) .Marland Kitchen... Take 1 tablet by mouth once a day  BP today: 172/97 Prior BP: 162/90 (08/15/2009)  Prior 10 Yr Risk  Heart Disease: 9 % (01/03/2007)  Labs Reviewed: K+: 4.0 (08/15/2009) Creat: : 0.78 (08/15/2009)   Chol: 171 (08/15/2009)   HDL: 41 (08/15/2009)   LDL: 111 (08/15/2009)   TG: 97 (08/15/2009)  Problem # 3:  ANEMIA, IRON DEFICIENCY NOS (ICD-280.9) I did not see a full anemia panel for her although she is microcytic and her ferritin was 88 on 11/09. Will get following labs. She is still having periods and this could have contributed to her microcytic anemia. She is also getting colonoscopy soon and will f/u these.  Her updated medication list for this problem includes:    Ferrous Sulfate 325 (65 Fe) Mg Tabs (Ferrous sulfate) .Marland Kitchen... Take one tablet three times a day  Orders: T-Ferritin [phone]) T-Iron [id number]) T-Iron Binding Capacity (TIBC) ([id number]) T-Vitamin B12 [id number]) T- * Misc. Laboratory test [id number])  Complete Medication List: 1)  Levothyroxine Sodium 50 Mcg Tabs (Levothyroxine sodium) .... Take 1 tablet by mouth once a day 2)  Aspirin 81 Mg Tbec (Aspirin) .... Take 1 tablet by mouth once a day 3)  Ferrous Sulfate 325 (65 Fe) Mg Tabs (Ferrous sulfate) .... Take one tablet three times a day 4)  Vicodin 5-500 Mg Tabs (Hydrocodone-acetaminophen) .... Take 1 tablet by mouth every 4 hours as needed for pain 5)  Lisinopril-hydrochlorothiazide 20-25 Mg Tabs (Lisinopril-hydrochlorothiazide) .... Take 1 tablet by mouth twice a day 6)  Metoprolol Tartrate 25 Mg Tabs (Metoprolol tartrate) .... Take 1 tablet by mouth two times a day 7)  Amlodipine Besylate 10 Mg Tabs (Amlodipine besylate) .... Take 1 tablet by mouth once a day 8)  Fluticasone Propionate 50 Mcg/act Susp (Fluticasone propionate) .... 2 sprays in each nostril twice daily 9)  Blood Pressure Monitor Misc (Misc. devices) 10)  Dex-tuss 300-10 Mg/41ml Liqd (Guaifenesin-codeine) .... Take 5 ml every 6 hourly until your cough resolves.  Patient Instructions: 1)  Please schedule a follow-up appointment in 2 weeks. 2)   Limit your Sodium (Salt) to less than 2 grams a day(slightly less than 1/2 a teaspoon) to prevent fluid retention, swelling, or worsening of symptoms. 3)  It is important that you exercise regularly at least 20 minutes 5 times a week. If you develop chest pain, have severe difficulty breathing, or feel very tired , stop exercising immediately and seek medical attention. 4)  You need to lose weight. Consider a lower calorie diet and regular exercise.  5)  Check your Blood Pressure regularly. If it is above: you should make an appointment. Process Orders Check Orders Results:     [facility]: Order checked:     [id number] -- T- * Misc. Laboratory test -- No CPT codes found (  CPT: ) Tests Sent for requisitioning (August 26, 2009 10:45 AM):     08/26/2009: [facility] -- T-Ferritin [[id number]] (signed)     08/26/2009: [facility] -- Augusto Gamble [[id number]] (signed)     08/26/2009: [facility] -- T-Iron Binding Capacity (TIBC) [[id number]] (signed)     08/26/2009: [facility] -- T-Vitamin B12 [[id number]] (signed)     08/26/2009: [facility] -- T- * Misc. Laboratory test [phone] (signed)    Process Orders Check Orders Results:     [facility]: Order checked:     [id number] -- T- * Misc. Laboratory test -- No CPT codes found (CPT: ) Tests Sent for requisitioning (August 26, 2009 10:45 AM):     08/26/2009: [facility] -- T-Ferritin [[id number]] (signed)     08/26/2009: [facility] -- Augusto Gamble [[id number]] (signed)     08/26/2009: [facility] -- T-Iron Binding Capacity (TIBC) [[id number]] (signed)     08/26/2009: [facility] -- T-Vitamin B12 [[id number]] (signed)     08/26/2009: [facility] -- T- * Misc. Laboratory test [phone] (signed)

## 2010-04-10 NOTE — Progress Notes (Signed)
Summary: refill/gg  Phone Note Call from Patient   Caller: Patient Summary of Call: Pt called to see where her Rx's were? She was to get antibiotic.  Please call into [facility] on [address]. Initial call taken by: Merrie Roof RN,  May 15, 2009 12:40 PM    Prescriptions: CIPROFLOXACIN HCL 500 MG TABS (CIPROFLOXACIN HCL) Take 1 tablet by mouth two times a day  #14 x 0   Entered and Authorized by:   Lars Mage MD   Signed by:   Lars Mage MD on 05/15/2009   Method used:   Electronically to        [facility] [phone]* (retail)       [address]       [place], [place]  [address]       Ph: [phone]       Fax: [phone]   RxID:   [id number]

## 2010-04-10 NOTE — Miscellaneous (Signed)
 Summary: [facility]: Rehab Equipment  [facility]: Rehab Equipment   Imported By: Ronal Louder 09/13/2008 14:10:08  _____________________________________________________________________  External Attachment:    Type:   Image     Comment:   External Document

## 2010-04-10 NOTE — Assessment & Plan Note (Signed)
Summary: ANOTHER PWC ASSESSMENT/CFB   Vital Signs:  Patient profile:   55 year old female Height:      63 inches (160.02 cm) Weight:      259.4 pounds (117.91 kg) BMI:     46.12 Temp:     98.2 degrees F (36.78 degrees C) oral Pulse rate:   79 / minute BP sitting:   136 / 78  (right arm) Cuff size:   THIGH  Vitals Entered By: [name]) (January 13, 2010 3:30 PM) CC: power wheelchair assesment, c/o left side/leg pain, and knee "giving out" Is Patient Diabetic? No Pain Assessment Patient in pain? yes     Location: left side/leg Intensity: 8 Type: sharp Onset of pain  Chronic Nutritional Status BMI of > 30 = obese  Have you ever been in a relationship where you felt threatened, hurt or afraid?No   Does patient need assistance? Functional Status Self care Ambulation Wheelchair Comments arrived    Primary Care Provider:  Lars Mage MD  CC:  power wheelchair assesment, c/o left side/leg pain, and and knee "giving out".  History of Present Illness: Ms Grandpre is here today to discus about deniel for a whel chair by the new company. She requests me to give her another prescription [facility].  BP is high today. She says that she has ben having left sided kne pain as she has to get around by herself which is agravating the kne pain. The pain is 5-6/10 at this time.Hydrocodone 1/2 tab helps. Has ben evaluated in the apst by Orthopedics and sports medicine but it did not help.  Agrees to enrol in the colonoscopy study. refuses flu shot, refuses pap smear today and promises she wil have it thye next visit.  No other complaints. Has lost 10 pounds over last few months and is working on losing more. No leg edema noted.    Preventive Screening-Counseling & Management  Alcohol-Tobacco     Alcohol drinks/day: 0     Smoking Status: never  Problems Prior to Update: 1)  Cough  (ICD-786.2) 2)  Acute Cystitis  (ICD-595.0) 3)  Postnasal  Drip  (ICD-784.91) 4)  Acute Pharyngitis  (ICD-462) 5)  Benign Paroxysmal Positional Vertigo  (ICD-386.11) 6)  Macular Degeneration, Bilateral  (ICD-362.50) 7)  Hemorrhoids  (ICD-455.6) 8)  Constipation, Drug Induced  (ICD-564.09) 9)  Shoulder Pain, Left  (ICD-719.41) 10)  Family History Breast Cancer 1st Degree Relative <50  (ICD-V16.3) 11)  Lump or Mass in Breast  (ICD-611.72) 12)  Hypertension  (ICD-401.9) 13)  Hypothyroidism  (ICD-244.9) 14)  Dysfunctional Uterine Bleeding  (ICD-626.8) 15)  Anemia, Iron Deficiency Nos  (ICD-280.9) 16)  Knee Pain, Chronic  (ICD-719.46) 17)  Hyperplasia, Endometrial Nos  (ICD-621.30) 18)  Hx of Health Screening  (ICD-V70.0) 19)  Sleep Apnea  (ICD-780.57) 20)  Morbid Obesity  (ICD-278.01) 21)  Sicca Syndrome  (ICD-710.2) 22)  Back Pain  (ICD-724.5)  Medications Prior to Update: 1)  Levothyroxine Sodium 50 Mcg Tabs (Levothyroxine Sodium) .... Take 1 Tablet By Mouth Once A Day 2)  Aspirin 81 Mg Tbec (Aspirin) .... Take 1 Tablet By Mouth Once A Day 3)  Ferrous Sulfate 325 (65 Fe) Mg Tabs (Ferrous Sulfate) .... Take One Tablet Three Times A Day 4)  Lisinopril-Hydrochlorothiazide 20-25 Mg Tabs (Lisinopril-Hydrochlorothiazide) .... Take 1 Tablet By Mouth Twice A Day 5)  Metoprolol Tartrate 25 Mg Tabs (Metoprolol Tartrate) .... Take 1 Tablet By Mouth Two Times A  Day 6)  Amlodipine Besylate 10 Mg Tabs (Amlodipine Besylate) .... Take 1 Tablet By Mouth Once A Day 7)  Blood Pressure Monitor  Misc (Misc. Devices) 8)  Dex-Tuss 300-10 Mg/55ml Liqd (Guaifenesin-Codeine) .... Take1 Teaspoon By Mouth Four Times A Day  As Needed.  Current Medications (verified): 1)  Levothyroxine Sodium 50 Mcg Tabs (Levothyroxine Sodium) .... Take 1 Tablet By Mouth Once A Day 2)  Aspirin 81 Mg Tbec (Aspirin) .... Take 1 Tablet By Mouth Once A Day 3)  Ferrous Sulfate 325 (65 Fe) Mg Tabs (Ferrous Sulfate) .... Take One Tablet Three Times A Day 4)  Lisinopril-Hydrochlorothiazide 20-25  Mg Tabs (Lisinopril-Hydrochlorothiazide) .... Take 1 Tablet By Mouth Twice A Day 5)  Metoprolol Tartrate 25 Mg Tabs (Metoprolol Tartrate) .... Take 1 Tablet By Mouth Two Times A Day 6)  Amlodipine Besylate 10 Mg Tabs (Amlodipine Besylate) .... Take 1 Tablet By Mouth Once A Day 7)  Blood Pressure Monitor  Misc (Misc. Devices) 8)  Dex-Tuss 300-10 Mg/66ml Liqd (Guaifenesin-Codeine) .... Take1 Teaspoon By Mouth Four Times A Day  As Needed. 9)  Hydrocodone-Acetaminophen 5-500 Mg Tabs (Hydrocodone-Acetaminophen) .... .1/2 To 1 Tab Every 6 Hours For Pain.  Allergies: 1)  ! * Amlodipine 2)  ! * Aleve 3)  ! * Coconut  Past History:  Past Medical History: Last updated: 08/08/2007 Primary Sjogren's syndrome with Sicca complex -  anti-Ro +; ANA>1:1280 in homogenpattern -  negativ dsDNA/ RF/ antiSmith/RNP/C3-4 comp/ La/ Jo1/ Sceleroderma/ centromere -  neg HIV/ ACE/ Hep B/C -  nl CXR 2/05 - Schirmer salivary gland test not done -  Symptom Rx; eye drops/ prednisone/ plaquenil -  Referral to [facility] rheum, [name] 3/07. Saw Dr. Jimmy Footman, but stopped 2/2 costs Back pain-chronic low back L4-L5 discectomy; 11/99. Degenerative thoracic spondylotic changes 4/07 Hypertension -  no LVH EKG 7/05 - nl M/C ratio 4/07 Morbid Obesity- ht unknown Endometrial hyperplasia stripe > 76mm->7MM; GYN referral and BX  pending?  Obstructive sleep apnea-severe 09/2003 RD 161 per hr./cpap 18 cwp Hypothyroidism-09/1998 Microcytic anemia-09/1998. Hgb 10.5/ MCV 76. Needs Ferritin to determine ACD vs IDA Menorrhagia-10/1998 Uterine bleeding Lower extremity edema -  echo EF 55-65% w/o evidence of Dias Dysfx -  M/C ratio nl 4/07 -  Hgb 10.5 4/07, MCV 76 Polyarthalgias (knee/back/angle)w/ dx of fibromyalgia? given by [facility] rheum -  Knee pain, chronic secondary to obesity, fibromyalgia,  and Sjogren's as per above CP 10/07 -  neg adenosine myoview  Past Surgical History: Last updated:  03/17/2006 Tonsillectomy, age 9 (6)  Family History: Last updated: 05/12/2007 Mom- 60's from ovarian cancer. HTN. Dad- 60's from brain cancer.  Sister died from breast cancer.  Family History Ovarian cancer Family History Breast cancer 1st degree relative <50  Social History: Last updated: 01/17/2008 Her daughter [name]) often comes with her to visits. She also has a Nurse, children's (whose name is "Stark Bray" as well).   Risk Factors: Alcohol Use: 0 (01/13/2010) Exercise: yes (09/13/2009)  Risk Factors: Smoking Status: never (01/13/2010)  Family History: Reviewed history from 05/12/2007 and no changes required. Mom- 60's from ovarian cancer. HTN. Dad- 60's from brain cancer.  Sister died from breast cancer.  Family History Ovarian cancer Family History Breast cancer 1st degree relative <50  Social History: Reviewed history from 01/17/2008 and no changes required. Her daughter [name]) often comes with her to visits. She also has a Nurse, children's (whose name is "Stark Bray" as well).   Review of Systems  See HPI  Physical Exam  Additional Exam:  Gen: AOx3, in no acute distress, siting comfortably in chair Eyes: PERRL, EOMI ENT:MMM, No erythema noted in posterior pharynx Neck: No JVD, No LAP Chest: CTAB with  good respiratory effort CVS: regular rhythmic rate, NO M/R/G, S1 S2 normal Abdo: soft,ND, BS+x4, Non tender and No hepatosplenomegaly EXT: Trace odema noted Neuro: Non focal, gait is normal Skin: no rashes noted.    Impression & Recommendations:  Problem # 1:  MORBID OBESITY (ICD-278.01) Assessment Improved Patient has morbid obesity. She has bilateral DJD most pronounced in knee joints. He has very limited mobility. She has been trying to lose weight and has lost 10 pounds. She needs a prescription for electric wheel chair. I will refer her to PT and have them fill up the forms related to the paperwork.  Ht:  63 (01/13/2010)   Wt: 259.4 (01/13/2010)   BMI: 46.12 (01/13/2010)  Problem # 2:  HYPERTENSION (ICD-401.9) Assessment: Unchanged At goal. Her updated medication list for this problem includes:    Lisinopril-hydrochlorothiazide 20-25 Mg Tabs (Lisinopril-hydrochlorothiazide) .Marland Kitchen... Take 1 tablet by mouth twice a day    Metoprolol Tartrate 25 Mg Tabs (Metoprolol tartrate) .Marland Kitchen... Take 1 tablet by mouth two times a day    Amlodipine Besylate 10 Mg Tabs (Amlodipine besylate) .Marland Kitchen... Take 1 tablet by mouth once a day  BP today: 136/78 Prior BP: 133/80 (11/25/2009)  Prior 10 Yr Risk Heart Disease: 9 % (01/03/2007)  Labs Reviewed: K+: 4.0 (08/15/2009) Creat: : 0.78 (08/15/2009)   Chol: 171 (08/15/2009)   HDL: 41 (08/15/2009)   LDL: 111 (08/15/2009)   TG: 97 (08/15/2009)  Problem # 3:  HYPOTHYROIDISM (ICD-244.9) Next TSH due in March 2012.  Her updated medication list for this problem includes:    Levothyroxine Sodium 50 Mcg Tabs (Levothyroxine sodium) .Marland Kitchen... Take 1 tablet by mouth once a day  Labs Reviewed: TSH: 3.239 (05/14/2009)    Chol: 171 (08/15/2009)   HDL: 41 (08/15/2009)   LDL: 111 (08/15/2009)   TG: 97 (08/15/2009)  Problem # 4:  KNEE PAIN, CHRONIC (ICD-719.46) Assessment: Deteriorated Secondary to morbid obesity. Deteriorated 2/2 her wheel chair being broken and she having to walk around a bit. Her updated medication list for this problem includes:    Aspirin 81 Mg Tbec (Aspirin) .Marland Kitchen... Take 1 tablet by mouth once a day    Hydrocodone-acetaminophen 5-500 Mg Tabs (Hydrocodone-acetaminophen) ..... .1/2 to 1 tab every 6 hours for pain.  Discussed strengthening exercises, use of ice or heat, and medications.   Problem # 5:  Hx of HEALTH SCREENING (ICD-V70.0) Assessment: Comment Only Denied pap smear today.  Complete Medication List: 1)  Levothyroxine Sodium 50 Mcg Tabs (Levothyroxine sodium) .... Take 1 tablet by mouth once a day 2)  Aspirin 81 Mg Tbec (Aspirin) .... Take 1 tablet  by mouth once a day 3)  Ferrous Sulfate 325 (65 Fe) Mg Tabs (Ferrous sulfate) .... Take one tablet three times a day 4)  Lisinopril-hydrochlorothiazide 20-25 Mg Tabs (Lisinopril-hydrochlorothiazide) .... Take 1 tablet by mouth twice a day 5)  Metoprolol Tartrate 25 Mg Tabs (Metoprolol tartrate) .... Take 1 tablet by mouth two times a day 6)  Amlodipine Besylate 10 Mg Tabs (Amlodipine besylate) .... Take 1 tablet by mouth once a day 7)  Blood Pressure Monitor Misc (Misc. devices) 8)  Dex-tuss 300-10 Mg/63ml Liqd (Guaifenesin-codeine) .... Take1 teaspoon by mouth four times a day  as needed. 9)  Hydrocodone-acetaminophen 5-500 Mg Tabs (Hydrocodone-acetaminophen) .... .1/2 to 1  tab every 6 hours for pain.  Patient Instructions: 1)  Please schedule a follow-up appointment in 3 months. 2)  You need to set up a visit for pap smear on any coming monday afternoon. 3)  It is important that you exercise regularly at least 20 minutes 5 times a week. If you develop chest pain, have severe difficulty breathing, or feel very tired , stop exercising immediately and seek medical attention. 4)  You need to lose weight. Consider a lower calorie diet and regular exercise.    Orders Added: 1)  Est. Patient Level III [16109]     Prevention & Chronic Care Immunizations   Influenza vaccine: Not documented   Influenza vaccine deferral: Refused  (11/25/2009)    Tetanus booster: 09/13/2009: Tdap   Td booster deferral: Refused  (04/03/2009)    Pneumococcal vaccine: Not documented   Pneumococcal vaccine deferral: Not indicated  (01/17/2009)  Colorectal Screening   Hemoccult: Not documented   Hemoccult action/deferral: Refused  (01/13/2010)    Colonoscopy: Not documented   Colonoscopy action/deferral: Refused  (01/13/2010)  Other Screening   Pap smear:  Specimen Adequacy: Satisfactory for evaluation.   Interpretation/Result:Negative for intraepithelial Lesion or Malignancy.     (08/26/2006)   Pap smear  action/deferral: Refused  (01/13/2010)   Pap smear due: 09/2009    Mammogram: ASSESSMENT: Negative - BI-RADS 1^MM DIGITAL SCREENING  (05/30/2009)   Mammogram action/deferral: Deferred  (05/14/2009)   Mammogram due: 12/2007   Smoking status: never  (01/13/2010)  Lipids   Total Cholesterol: 171  (08/15/2009)   LDL: 111  (08/15/2009)   LDL Direct: Not documented   HDL: 41  (08/15/2009)   Triglycerides: 97  (08/15/2009)  Hypertension   Last Blood Pressure: 136 / 78  (01/13/2010)   Serum creatinine: 0.78  (08/15/2009)   Serum potassium 4.0  (08/15/2009)    Hypertension flowsheet reviewed?: Yes   Progress toward BP goal: Deteriorated  Self-Management Support :   Personal Goals (by the next clinic visit) :      Personal blood pressure goal: 140/90  (01/17/2009)   Patient will work on the following items until the next clinic visit to reach self-care goals:     Medications and monitoring: take my medicines every day  (01/13/2010)     Eating: eat foods that are low in salt, eat baked foods instead of fried foods  (01/13/2010)     Activity: take a 30 minute walk every day  (08/15/2009)    Hypertension self-management support: Written self-care plan  (01/13/2010)   Hypertension self-care plan printed.   Appended Document: ANOTHER PWC ASSESSMENT/CFB    Clinical Lists Changes  Problems: Assessed KNEE PAIN, CHRONIC as unchanged -  Refer to PT for evaluation for power wheelchair mobility assessment.  Her updated medication list for this problem includes:    Aspirin 81 Mg Tbec (Aspirin) .Marland Kitchen... Take 1 tablet by mouth once a day    Hydrocodone-acetaminophen 5-500 Mg Tabs (Hydrocodone-acetaminophen) ..... .1/2 to 1 tab every 6 hours for pain.  Orders: Physical Therapy Referral (PT)  Orders: Added new Referral order of Physical Therapy Referral (PT) - Signed       Impression & Recommendations:  Problem # 1:  KNEE PAIN, CHRONIC (ICD-719.46) Assessment Unchanged  Refer to  PT for evaluation for power wheelchair mobility assessment.  Her updated medication list for this problem includes:    Aspirin 81 Mg Tbec (Aspirin) .Marland Kitchen... Take 1 tablet by mouth once a day    Hydrocodone-acetaminophen 5-500 Mg Tabs (  Hydrocodone-acetaminophen) ..... .1/2 to 1 tab every 6 hours for pain.  Orders: Physical Therapy Referral (PT)  Complete Medication List: 1)  Levothyroxine Sodium 50 Mcg Tabs (Levothyroxine sodium) .... Take 1 tablet by mouth once a day 2)  Aspirin 81 Mg Tbec (Aspirin) .... Take 1 tablet by mouth once a day 3)  Ferrous Sulfate 325 (65 Fe) Mg Tabs (Ferrous sulfate) .... Take one tablet three times a day 4)  Lisinopril-hydrochlorothiazide 20-25 Mg Tabs (Lisinopril-hydrochlorothiazide) .... Take 1 tablet by mouth twice a day 5)  Metoprolol Tartrate 25 Mg Tabs (Metoprolol tartrate) .... Take 1 tablet by mouth two times a day 6)  Amlodipine Besylate 10 Mg Tabs (Amlodipine besylate) .... Take 1 tablet by mouth once a day 7)  Blood Pressure Monitor Misc (Misc. devices) 8)  Dex-tuss 300-10 Mg/47ml Liqd (Guaifenesin-codeine) .... Take1 teaspoon by mouth four times a day  as needed. 9)  Hydrocodone-acetaminophen 5-500 Mg Tabs (Hydrocodone-acetaminophen) .... .1/2 to 1 tab every 6 hours for pain.

## 2010-04-10 NOTE — Assessment & Plan Note (Signed)
 Summary: RECK BP/PHIFER/VS   Vital Signs:  Patient profile:   55 year old female Height:      63 inches (160.02 cm) Weight:      271.8 pounds (123.55 kg) BMI:     48.32 Temp:     97.2 degrees F oral Pulse rate:   71 / minute BP sitting:   150 / 93  (left arm)  Vitals Entered By: Gaetana Pan RN (Jul 11, 2008 8:46 AM)  CC: Re-check BP;little toe on left foot red/swollen since Sunday Is Patient Diabetic? No Pain Assessment Patient in pain? yes     Location: back Intensity: 7 Type: aching Onset of pain  Intermittent Nutritional Status BMI of > 30 = obese  Have you ever been in a relationship where you felt threatened, hurt or afraid?No   Does patient need assistance? Functional Status Self care Ambulation Impaired:Risk for fall   Primary Care Provider:  Inocente Lindsay MD  CC:  Re-check BP;little toe on left foot red/swollen since Sunday.  History of Present Illness: [name] is a 55 y/o woman with HTN who presents to the opc for a bp recheck.  She is c/o redness and swelling on both her 5th toes. On Sunday, she noted redness, swelling and pain in both her small toes. She wore her tennis shoes all day and doesn't recall hitting her toes anymore or injuring herself in any way. She decided to let her feet soak yesterday and a bullae ruptured on the inside of her L 5th toe - purulent material came out. Since then, the pain has decreased significantly in the L toe. However, the R toe is still quite tender. She denies any fevers or chills. No recent antibiotics, travels or hx of MRSA that she knows of.  Preventive Screening-Counseling & Management     Alcohol drinks/day: 0     Smoking Status: never     Does Patient Exercise: yes     Type of exercise: WALKING     Exercise (avg: min/session):     Times/week:   1-2  Allergies: 1)  ! * Amlodipine  2)  ! * Aleve 3)  ! * Cocunax  Review of Systems General:  Denies chills and fever. Eyes:  Denies blurring and double  vision. ENT:  Denies nasal congestion and sore throat. CV:  Denies chest pain or discomfort, palpitations, and swelling of feet. Resp:  Denies cough, sputum productive, and wheezing. GI:  Denies change in bowel habits, nausea, and vomiting. GU:  Denies dysuria and hematuria. MS:  Denies joint pain and joint swelling. Derm:  Denies itching and rash. Neuro:  Denies falling down and headaches. Psych:  Denies anxiety and depression. Endo:  Denies excessive hunger and excessive thirst. Heme:  Denies abnormal bruising and bleeding. Allergy:  Denies seasonal allergies and sneezing.  Physical Exam  General:  Morbidly obese middle-aged woman in NAD, sitting in wheelchair. Head:  atraumatic.   Eyes:  vision grossly intact, pupils equal, pupils round, and pupils reactive to light. Anicteric, no injection. Wearing glasses. Ears:  no external deformities.   Nose:  no external deformity.   Mouth:  OP clear, fair dentition. Neck:  supple, full ROM, and no masses.   Lungs:  normal respiratory effort, no intercostal retractions, no accessory muscle use, and normal breath sounds.   Heart:  normal rate, regular rhythm, and no murmur.   Abdomen:  soft, non-tender, and normal bowel sounds.   Pulses:  2+ pedal pulses bilaterally. Extremities:  Trace edema up to both knees. No cy anosis or clubbing. L 5th toe: erythematous, moderately edematous but non tender to palpation. There is evidence of a ruptured bullae on the inside part of the toe. No further material can be extracted. Pt has evidence of tinea b/w 5-4 and 4-3rd toes. R 5th toe: erythematous, edematous, warm to palpation and moderate tenderness to palpation (especially the tip of the toe). There is a small bullae on the inferior part of the toe. Neurologic:  alert & oriented X3, cranial nerves II-XII intact, strength normal in all extremities, and sensation intact to light touch.   Skin:  no rashes, no petechiae, and no purpura.   Psych:  Oriented  X3, memory intact for recent and remote, normally interactive, good eye contact, not anxious appearing, and not depressed appearing.   Additional Exam:  After written consent was obtained, I positioned the patient's R foot. Her skin was prepped with iodine and alcohol. I numbed her skin with a numbing spray and used a sterile needle to perforate the bullae on the inferior aspect of the R 5th toe. Small amount of purulent material extracted. Minimal bleeding. A sterile dressing was applied.   Impression & Recommendations:  Problem # 1:  UNSPECIFIED CELLULITIS AND ABSCESS OF TOE (ICD-681.10) The L toe abscess ruptured on its own and the pt has had improvement in her symptoms when the pressure was relieved. There isn't anything left for me to debride. The R toe had a small bullae on its inferior part so I drained it and a small amount of purulent material was extracted. Pt does have evidence of tinea pedis which is likely the port of entry for her cellulitis. I instructed pt to apply Lamisil b/w her toes two times a day, to keep her feet dry, to wear white socks and shoes at all times and to avoid soaking her feet. She will take doxycycline  100 two times a day for 7 days and return in a week to make sure that the cellulitis have resolved.  Orders: I&D Abscess, Simple / Single (10060)  Her updated medication list for this problem includes:    Doxycycline  Hyclate 100 Mg Caps (Doxycycline  hyclate) .SABRA... Take 1 capsule by mouth two times a day  Problem # 2:  DERMATOPHYTOSIS OF FOOT (ICD-110.4) Likely the port of entry for #1.  Her updated medication list for this problem includes:    Lamisil At 1 % Crea (Terbinafine hcl) .SABRA... Apply a thin layer between the toes two times a day  Problem # 3:  HYPERTENSION (ICD-401.9) Not at goal but better than it was on her last visit. Will increase metoprolol  from 25 two times a day to 50 two times a day.  Her updated medication list for this problem  includes:    Hydrochlorothiazide  25 Mg Tabs (Hydrochlorothiazide ) .SABRA... Take 1 tablet by mouth once a day    Metoprolol  Tartrate 50 Mg Tabs (Metoprolol  tartrate) .SABRA... Take 1 tablet by mouth two times a day  BP today: 150/93 Prior BP: 165/86 (06/26/2008)  Prior 10 Yr Risk Heart Disease: 9 % (01/03/2007)  Labs Reviewed: K+: 3.8 (05/22/2008) Creat: : 0.85 (05/22/2008)   Chol: 161 (05/12/2007)   HDL: 49 (05/12/2007)   LDL: 90 (05/12/2007)   TG: 108 (05/12/2007)  Problem # 4:  HYPOTHYROIDISM (ICD-244.9) Well controlled.  Her updated medication list for this problem includes:    Levothyroxine  Sodium 50 Mcg Tabs (Levothyroxine  sodium) .SABRA... Take 1 tablet by mouth once a day  Labs Reviewed: TSH: 3.699 (05/22/2008)    Chol: 161 (05/12/2007)   HDL: 49 (05/12/2007)   LDL: 90 (05/12/2007)   TG: 108 (05/12/2007)  Complete Medication List: 1)  Hydrochlorothiazide  25 Mg Tabs (Hydrochlorothiazide ) .... Take 1 tablet by mouth once a day 2)  Levothyroxine  Sodium 50 Mcg Tabs (Levothyroxine  sodium) .... Take 1 tablet by mouth once a day 3)  Cyclobenzaprine  Hcl 10 Mg Tabs (Cyclobenzaprine  hcl) .... At bedtime as needed 4)  Oscal 500/200 D-3 Tabs (Calcium -vitamin d tabs) .... Take 1 tablet by mouth two times a day 5)  Aspirin  81 Mg Tbec (Aspirin ) .... Take 1 tablet by mouth once a day 6)  Metoprolol  Tartrate 50 Mg Tabs (Metoprolol  tartrate) .... Take 1 tablet by mouth two times a day 7)  Ferrous Sulfate  325 (65 Fe) Mg Tabs (Ferrous sulfate ) .... Take one tablet three times a day 8)  Hydrocortisone 2.5 % Crea (Hydrocortisone) .... Apply two times a day as needed and after bowel movement. 9)  Hydrocortisone Acetate 25 Mg Supp (Hydrocortisone acetate) .... Apply two times a day as needed 10)  Antivert 25 Mg Tabs (Meclizine hcl) .... Take 1 tablet by mouth three times a day 11)  Vicodin 5-500 Mg Tabs (Hydrocodone -acetaminophen ) .... Take 1 tablet by mouth every 4 hours as needed for pain 12)  Doxycycline   Hyclate 100 Mg Caps (Doxycycline  hyclate) .... Take 1 capsule by mouth two times a day 13)  Lamisil At 1 % Crea (Terbinafine hcl) .... Apply a thin layer between the toes two times a day  Patient Instructions: 1)  Please schedule a follow-up appointment in 1 week. 2)  Take doxycycline  100 mg by mouth two times a day for 7 days for your toe infections. 3)  Apply a thin layer of terbinafine (Lamisil AT) two times a day between all your toes. 4)  Make sure you don't walk around barefoot. Don't soak your feet. Don't use lotion or cream in between your toes (except for the terbinafine/Lamisil). 5)  I have increased your metoprolol  to 50 mg two times a day (you can take 2 tabs of the 25 mg two times a day until you run out) because your blood pressure is not well controlled. Prescriptions: METOPROLOL  TARTRATE 50 MG TABS (METOPROLOL  TARTRATE) Take 1 tablet by mouth two times a day  #60 x 11   Entered and Authorized by:   Gracie Raspberry MD   Signed by:   Gracie Raspberry MD on 07/11/2008   Method used:   Electronically to        [facility] [phone]* (retail)       [address]       [place], [place]  [address]       Ph: [phone]       Fax: [phone]   RxID:   [id number] LAMISIL AT 1 % CREA (TERBINAFINE HCL) Apply a thin layer between the toes two times a day  #24g x 1   Entered and Authorized by:   Gracie Raspberry MD   Signed by:   Gracie Raspberry MD on 07/11/2008   Method used:   Electronically to        [facility] [phone]* (retail)       [address]       [place], [place]  [address]       Ph: [phone]       Fax: [phone]   RxID:   [id number] DOXYCYCLINE  HYCLATE 100 MG CAPS (DOXYCYCLINE  HYCLATE) Take  1 capsule by mouth two times a day  #14 x 0   Entered and Authorized by:   Gracie Raspberry MD   Signed by:   Gracie Raspberry MD on 07/11/2008   Method used:   Electronically to        [facility] [phone]* (retail)       [address]        [place], [place]  [address]       Ph: [phone]       Fax: [phone]   RxID:   [id number]

## 2010-04-10 NOTE — Miscellaneous (Signed)
Summary: Medical Modalities: CMN  Medical Modalities: CMN   Imported By: Florinda Marker 05/10/2009 15:46:16  _____________________________________________________________________  External Attachment:    Type:   Image     Comment:   External Document

## 2010-04-10 NOTE — Assessment & Plan Note (Signed)
Summary: ACUTE-SIDE PAIN AND COUGHING(GARG)/CFB   Vital Signs:  Patient profile:   55 year old female Height:      63 inches (160.02 cm) Weight:      263.8 pounds (119.91 kg) BMI:     46.90 O2 Sat:      98 % on Room air Temp:     97.9 degrees F (36.61 degrees C) oral Pulse rate:   77 / minute BP sitting:   162 / 90  (left arm)  Vitals Entered By: Stanton Kidney Ditzler RN (August 15, 2009 11:21 AM)  O2 Flow:  Room air Is Patient Diabetic? No Pain Assessment Patient in pain? yes     Location: ribs and sides Intensity: 7 Type: painful Onset of pain  past 2 weeks Nutritional Status BMI of > 30 = obese Nutritional Status Detail appetite ok  Have you ever been in a relationship where you felt threatened, hurt or afraid?denies   Does patient need assistance? Functional Status Self care Ambulation Wheelchair Comments Daughter with pt. Past 2 weeks has had yellow - green and clear productive cough. Ears are hurting. Ribs and sides are sore.   Primary Care Provider:  Lars Mage MD   History of Present Illness: Nancy [name] comes today for the followings:  1. Cough: She started to have have rib pain just below the breast and on the sides for 2 wks. Coughing makes the pain worse. Then she started to have cough. SHe ha clear/light green/yellow phlegm. Sometimes she is SOB. No fever but may have had chills. No sick contacts. She has not tried anything at home for the cough. She also has muscle aches, and some diarrhea but no n/v. She doesnot smoke.   2. HTN: She is taking her meds regularly.   3. Hypothyroidsm: She takes her thyroid meds regularly.   4. OSA: She uses CPAP at home.   Depression History:      The patient denies a depressed mood most of the day and a diminished interest in her usual daily activities.         Preventive Screening-Counseling & Management  Alcohol-Tobacco     Alcohol drinks/day: 0     Smoking Status: never  Caffeine-Diet-Exercise     Does Patient  Exercise: yes     Type of exercise: WALKING     Exercise (avg: min/session):     Times/week:   1-2  Current Medications (verified): 1)  Levothyroxine Sodium 50 Mcg Tabs (Levothyroxine Sodium) .... Take 1 Tablet By Mouth Once A Day 2)  Aspirin 81 Mg Tbec (Aspirin) .... Take 1 Tablet By Mouth Once A Day 3)  Ferrous Sulfate 325 (65 Fe) Mg Tabs (Ferrous Sulfate) .... Take One Tablet Three Times A Day 4)  Vicodin 5-500 Mg Tabs (Hydrocodone-Acetaminophen) .... Take 1 Tablet By Mouth Every 4 Hours As Needed For Pain 5)  Lisinopril-Hydrochlorothiazide 20-25 Mg Tabs (Lisinopril-Hydrochlorothiazide) .... Take 1 Tablet By Mouth Twice A Day 6)  Metoprolol Tartrate 25 Mg Tabs (Metoprolol Tartrate) .... Take 1 Tablet By Mouth Two Times A Day 7)  Amlodipine Besylate 10 Mg Tabs (Amlodipine Besylate) .... Take 1 Tablet By Mouth Once A Day 8)  Fluticasone Propionate 50 Mcg/act Susp (Fluticasone Propionate) .... 2 Sprays in Each Nostril Twice Daily 9)  Blood Pressure Monitor  Misc (Misc. Devices) 10)  Dex-Tuss 300-10 Mg/26ml Liqd (Guaifenesin-Codeine) .... Take 5 Ml Every 6 Hourly Until Your Cough Resolves.  Allergies: 1)  ! * Amlodipine 2)  ! * Aleve  3)  ! * Coconut  Review of Systems      See HPI  Physical Exam  Mouth:  pharynx pink and moist, no erythema, no exudates, no posterior lymphoid hypertrophy, no postnasal drip, and no pharyngeal crowing.   Lungs:  normal breath sounds, no crackles, and no wheezes.   Heart:  normal rate, regular rhythm, no murmur, and no gallop.   Abdomen:  soft, non-tender, and normal bowel sounds.   Extremities:  trace left pedal edema and trace right pedal edema.   Neurologic:  alert & oriented X3.     Impression & Recommendations:  Problem # 1:  COUGH (ICD-786.2) I think this is viral URI. SHe has persistent cough with some productive sputum. In addition, she also has b/l rib pain, most likely from the incessant cough, although she states the pain started  before the cough. I will treat this with cough expectorant that contains codiene so that she also gets some relief for pain. WIll f/u in 2 wks and check followings in the meantime. If not improved, she will need CXR or an antibiotics.   Orders: T-Comprehensive Metabolic Panel [id number]) T-Lipid Profile [id number]) T-CBC w/Diff ([id number])  Problem # 2:  HYPERTENSION (ICD-401.9) I believe her elevated BP is primarily from pain and cough. Will f/u in next 2 wks. SHe is taking her meds regularly.  Her updated medication list for this problem includes:    Lisinopril-hydrochlorothiazide 20-25 Mg Tabs (Lisinopril-hydrochlorothiazide) .Marland Kitchen... Take 1 tablet by mouth twice a day    Metoprolol Tartrate 25 Mg Tabs (Metoprolol tartrate) .Marland Kitchen... Take 1 tablet by mouth two times a day    Amlodipine Besylate 10 Mg Tabs (Amlodipine besylate) .Marland Kitchen... Take 1 tablet by mouth once a day  BP today: 162/90 Prior BP: 126/68 (05/14/2009)  Prior 10 Yr Risk Heart Disease: 9 % (01/03/2007)  Labs Reviewed: K+: 4.1 (12/06/2008) Creat: : 0.80 (12/06/2008)   Chol: 173 (12/06/2008)   HDL: 45 (12/06/2008)   LDL: 107 (12/06/2008)   TG: 106 (12/06/2008)  Complete Medication List: 1)  Levothyroxine Sodium 50 Mcg Tabs (Levothyroxine sodium) .... Take 1 tablet by mouth once a day 2)  Aspirin 81 Mg Tbec (Aspirin) .... Take 1 tablet by mouth once a day 3)  Ferrous Sulfate 325 (65 Fe) Mg Tabs (Ferrous sulfate) .... Take one tablet three times a day 4)  Vicodin 5-500 Mg Tabs (Hydrocodone-acetaminophen) .... Take 1 tablet by mouth every 4 hours as needed for pain 5)  Lisinopril-hydrochlorothiazide 20-25 Mg Tabs (Lisinopril-hydrochlorothiazide) .... Take 1 tablet by mouth twice a day 6)  Metoprolol Tartrate 25 Mg Tabs (Metoprolol tartrate) .... Take 1 tablet by mouth two times a day 7)  Amlodipine Besylate 10 Mg Tabs (Amlodipine besylate) .... Take 1 tablet by mouth once a day 8)  Fluticasone Propionate 50 Mcg/act Susp  (Fluticasone propionate) .... 2 sprays in each nostril twice daily 9)  Blood Pressure Monitor Misc (Misc. devices) 10)  Dex-tuss 300-10 Mg/33ml Liqd (Guaifenesin-codeine) .... Take 5 ml every 6 hourly until your cough resolves.  Patient Instructions: 1)  F/u in 10 days 2)  Limit your Sodium (Salt) to less than 2 grams a day(slightly less than 1/2 a teaspoon) to prevent fluid retention, swelling, or worsening of symptoms. 3)  It is important that you exercise regularly at least 20 minutes 5 times a week. If you develop chest pain, have severe difficulty breathing, or feel very tired , stop exercising immediately and seek medical attention. 4)  You need to lose weight.  Consider a lower calorie diet and regular exercise.  5)  Check your Blood Pressure regularly. If it is above: you should make an appointment. Prescriptions: DEX-TUSS 300-10 MG/5ML LIQD (GUAIFENESIN-CODEINE) take 5 mL every 6 hourly until your cough resolves.  #1 x 1   Entered and Authorized by:   Jason Coop MD   Signed by:   Jason Coop MD on 08/15/2009   Method used:   Print then Give to Patient   RxID:   [id number] AMLODIPINE BESYLATE 10 MG TABS (AMLODIPINE BESYLATE) Take 1 tablet by mouth once a day  #30 x 1   Entered and Authorized by:   Jason Coop MD   Signed by:   Jason Coop MD on 08/15/2009   Method used:   Electronically to        [facility] [phone]* (retail)       [address]       [place], [place]  [address]       Ph: [phone]       Fax: [phone]   RxID:   [id number]  Process Orders Check Orders Results:     [facility]: Check successful Tests Sent for requisitioning (August 15, 2009 1:48 PM):     08/15/2009: [facility] -- T-Comprehensive Metabolic Panel [[id number]] (signed)     08/15/2009: [facility] -- T-Lipid Profile [id number] (signed)     08/15/2009: [facility] -- T-CBC w/Diff  [[id number]] (signed)    Prevention & Chronic Care Immunizations   Influenza vaccine: Not documented   Influenza vaccine deferral: Deferred  (04/29/2009)    Tetanus booster: 04/29/2009: Td   Td booster deferral: Refused  (04/03/2009)    Pneumococcal vaccine: Not documented   Pneumococcal vaccine deferral: Not indicated  (01/17/2009)  Colorectal Screening   Hemoccult: Not documented   Hemoccult action/deferral: Not indicated  (01/17/2009)    Colonoscopy: Not documented   Colonoscopy action/deferral: Refused  (04/29/2009)  Other Screening   Pap smear:  Specimen Adequacy: Satisfactory for evaluation.   Interpretation/Result:Negative for intraepithelial Lesion or Malignancy.     (08/26/2006)   Pap smear action/deferral: Deferred  (05/14/2009)   Pap smear due: 09/2009    Mammogram: ASSESSMENT: Negative - BI-RADS 1^MM DIGITAL SCREENING  (05/30/2009)   Mammogram action/deferral: Deferred  (05/14/2009)   Mammogram due: 12/2007   Smoking status: never  (08/15/2009)  Lipids   Total Cholesterol: 173  (12/06/2008)   LDL: 107  (12/06/2008)   LDL Direct: Not documented   HDL: 45  (12/06/2008)   Triglycerides: 106  (12/06/2008)  Hypertension   Last Blood Pressure: 162 / 90  (08/15/2009)   Serum creatinine: 0.80  (12/06/2008)   Serum potassium 4.1  (12/06/2008) CMP ordered     Hypertension flowsheet reviewed?: Yes   Progress toward BP goal: Deteriorated  Self-Management Support :   Personal Goals (by the next clinic visit) :      Personal blood pressure goal: 140/90  (01/17/2009)   Patient will work on the following items until the next clinic visit to reach self-care goals:     Medications and monitoring: take my medicines every day, bring all of my medications to every visit, weigh myself weekly  (08/15/2009)     Eating: eat more vegetables, use fresh or frozen vegetables, eat foods that are low in salt, eat fruit for snacks and desserts, limit or avoid alcohol   (08/15/2009)     Activity: take a 30 minute walk every day  (08/15/2009)  Hypertension self-management support: Written self-care plan, Education handout, Resources for patients handout  (08/15/2009)   Hypertension self-care plan printed.   Hypertension education handout printed      Resource handout printed.  Process Orders Check Orders Results:     [facility]: Check successful Tests Sent for requisitioning (August 15, 2009 1:48 PM):     08/15/2009: [facility] -- T-Comprehensive Metabolic Panel [[id number]] (signed)     08/15/2009: [facility] -- T-Lipid Profile [id number] (signed)     08/15/2009: [facility] -- T-CBC w/Diff [[id number]] (signed)

## 2010-04-10 NOTE — Letter (Signed)
 Summary: Ophthalmology: Dr. Cleatus  Ophthalmology: Dr. Cleatus   Imported By: Ronal Louder 12/07/2007 16:41:54  _____________________________________________________________________  External Attachment:    Type:   Image     Comment:   External Document

## 2010-04-10 NOTE — Assessment & Plan Note (Signed)
Summary: EST-CK/FU/MEDS/CFB   Vital Signs:  Patient profile:   55 year old female Height:      63 inches Weight:      265.1 pounds BMI:     47.13 Temp:     97.4 degrees F oral Pulse rate:   84 / minute BP sitting:   185 / 93  (right arm)  Vitals Entered By: Filomena Jungling NT II (April 03, 2009 2:35 PM) CC: need  refills Is Patient Diabetic? No Pain Assessment Patient in pain? yes     Location: joints Intensity: 10 Type: aching Onset of pain  Chronic Nutritional Status BMI of > 30 = obese  Does patient need assistance? Functional Status Self care Comments uses a cane   Primary Care Provider:  Lars Mage MD  CC:  need  refills.  History of Present Illness: Ms Harumi is a 55 year old woman with past medical history of HTN, Hypothyroidism and chronic back pain s/p fusion of her lumbar vertebrae in 1999 and chronic shoulder pain s/p repair of rotator cuff tear. She still complains of back pain which is 8-9/10. she is getting PT for that but needs another referrel today as she missed too many appointments. She say that she missed her BP meds this morning as she ran out of them.  She is still hurting her left shoulder. She also needs a prescription for a wheel chair as it has been 5 years that she has had the old wheel chair.   Preventive Screening-Counseling & Management  Alcohol-Tobacco     Alcohol drinks/day: 0     Smoking Status: never  Caffeine-Diet-Exercise     Does Patient Exercise: yes     Type of exercise: WALKING     Exercise (avg: min/session):     Times/week:   1-2  Problems Prior to Update: 1)  Postnasal Drip  (ICD-784.91) 2)  Acute Pharyngitis  (ICD-462) 3)  Benign Paroxysmal Positional Vertigo  (ICD-386.11) 4)  Macular Degeneration, Bilateral  (ICD-362.50) 5)  Hemorrhoids  (ICD-455.6) 6)  Constipation, Drug Induced  (ICD-564.09) 7)  Shoulder Pain, Left  (ICD-719.41) 8)  Family History Breast Cancer 1st Degree Relative <50  (ICD-V16.3) 9)   Lump or Mass in Breast  (ICD-611.72) 10)  Hypertension  (ICD-401.9) 11)  Hypothyroidism  (ICD-244.9) 12)  Dysfunctional Uterine Bleeding  (ICD-626.8) 13)  Anemia, Iron Deficiency Nos  (ICD-280.9) 14)  Knee Pain, Chronic  (ICD-719.46) 15)  Hyperplasia, Endometrial Nos  (ICD-621.30) 16)  Hx of Health Screening  (ICD-V70.0) 17)  Sleep Apnea  (ICD-780.57) 18)  Morbid Obesity  (ICD-278.01) 19)  Sicca Syndrome  (ICD-710.2) 20)  Back Pain  (ICD-724.5)  Medications Prior to Update: 1)  Levothyroxine Sodium 50 Mcg Tabs (Levothyroxine Sodium) .... Take 1 Tablet By Mouth Once A Day 2)  Aspirin 81 Mg Tbec (Aspirin) .... Take 1 Tablet By Mouth Once A Day 3)  Ferrous Sulfate 325 (65 Fe) Mg Tabs (Ferrous Sulfate) .... Take One Tablet Three Times A Day 4)  Vicodin 5-500 Mg Tabs (Hydrocodone-Acetaminophen) .... Take 1 Tablet By Mouth Every 4 Hours As Needed For Pain 5)  Lisinopril-Hydrochlorothiazide 20-25 Mg Tabs (Lisinopril-Hydrochlorothiazide) .... Take 1 Tablet By Mouth Once A Day 6)  Metoprolol Tartrate 25 Mg Tabs (Metoprolol Tartrate) .... Take 1 Tablet By Mouth Two Times A Day 7)  Amlodipine Besylate 10 Mg Tabs (Amlodipine Besylate) .... Take 1 Tablet By Mouth Once A Day 8)  Fluticasone Propionate 50 Mcg/act Susp (Fluticasone Propionate) .... 2 Sprays in  Each Nostril Twice Daily 9)  Blood Pressure Monitor  Misc (Misc. Devices)  Current Medications (verified): 1)  Levothyroxine Sodium 50 Mcg Tabs (Levothyroxine Sodium) .... Take 1 Tablet By Mouth Once A Day 2)  Aspirin 81 Mg Tbec (Aspirin) .... Take 1 Tablet By Mouth Once A Day 3)  Ferrous Sulfate 325 (65 Fe) Mg Tabs (Ferrous Sulfate) .... Take One Tablet Three Times A Day 4)  Vicodin 5-500 Mg Tabs (Hydrocodone-Acetaminophen) .... Take 1 Tablet By Mouth Every 4 Hours As Needed For Pain 5)  Lisinopril-Hydrochlorothiazide 20-25 Mg Tabs (Lisinopril-Hydrochlorothiazide) .... Take 1 Tablet By Mouth Once A Day 6)  Metoprolol Tartrate 25 Mg Tabs  (Metoprolol Tartrate) .... Take 1 Tablet By Mouth Two Times A Day 7)  Amlodipine Besylate 10 Mg Tabs (Amlodipine Besylate) .... Take 1 Tablet By Mouth Once A Day 8)  Fluticasone Propionate 50 Mcg/act Susp (Fluticasone Propionate) .... 2 Sprays in Each Nostril Twice Daily 9)  Blood Pressure Monitor  Misc (Misc. Devices)  Allergies (verified): 1)  ! * Amlodipine 2)  ! * Aleve 3)  ! * Cocunax  Past History:  Past Medical History: Last updated: 08/08/2007 Primary Sjogren's syndrome with Sicca complex -  anti-Ro +; ANA>1:1280 in homogenpattern -  negativ dsDNA/ RF/ antiSmith/RNP/C3-4 comp/ La/ Jo1/ Sceleroderma/ centromere -  neg HIV/ ACE/ Hep B/C -  nl CXR 2/05 - Schirmer salivary gland test not done -  Symptom Rx; eye drops/ prednisone/ plaquenil -  Referral to [facility] rheum, [name] 3/07. Saw Dr. Jimmy Footman, but stopped 2/2 costs Back pain-chronic low back L4-L5 discectomy; 11/99. Degenerative thoracic spondylotic changes 4/07 Hypertension -  no LVH EKG 7/05 - nl M/C ratio 4/07 Morbid Obesity- ht unknown Endometrial hyperplasia stripe > 60mm->7MM; GYN referral and BX  pending?  Obstructive sleep apnea-severe 09/2003 RD 161 per hr./cpap 18 cwp Hypothyroidism-09/1998 Microcytic anemia-09/1998. Hgb 10.5/ MCV 76. Needs Ferritin to determine ACD vs IDA Menorrhagia-10/1998 Uterine bleeding Lower extremity edema -  echo EF 55-65% w/o evidence of Dias Dysfx -  M/C ratio nl 4/07 -  Hgb 10.5 4/07, MCV 76 Polyarthalgias (knee/back/angle)w/ dx of fibromyalgia? given by [facility] rheum -  Knee pain, chronic secondary to obesity, fibromyalgia,  and Sjogren's as per above CP 10/07 -  neg adenosine myoview  Past Surgical History: Last updated: 03/17/2006 Tonsillectomy, age 31 (16)  Family History: Last updated: 05/12/2007 Mom- 60's from ovarian cancer. HTN. Dad- 60's from brain cancer.  Sister died from breast cancer.  Family History Ovarian cancer Family History Breast cancer  1st degree relative <50  Social History: Last updated: 01/17/2008 Her daughter [name]) often comes with her to visits. She also has a Nurse, children's (whose name is "Stark Bray" as well).   Risk Factors: Alcohol Use: 0 (04/03/2009) Exercise: yes (04/03/2009)  Risk Factors: Smoking Status: never (04/03/2009)  Review of Systems      See HPI  Physical Exam  Additional Exam:  Gen: AOx3, in acute distress due to pain Eyes: PERRL, EOMI ENT:MMM, No erythema noted in posterior pharynx Neck: No JVD, No LAP Chest: CTAB with  good respiratory effort CVS: regular rhythmic rate, NO M/R/G, S1 S2 normal Abdo: soft,ND, BS+x4, Non tender and No hepatosplenomegaly EXT: No odema noted Neuro: Non focal, gait is normal Skin: no rashes noted.    Impression & Recommendations:  Problem # 1:  BACK PAIN (ICD-724.5) Assessment Deteriorated Patient was having unbearable back pain which is most likely chronic in nature with acute ecxacerbation in view of recent inactivity  and missing last few PT appointments. she is out of her pain meds. I will rerefer her to PT and also refilled pain meds. i will review her back in 2 weeks. Her updated medication list for this problem includes:    Aspirin 81 Mg Tbec (Aspirin) .Marland Kitchen... Take 1 tablet by mouth once a day    Vicodin 5-500 Mg Tabs (Hydrocodone-acetaminophen) .Marland Kitchen... Take 1 tablet by mouth every 4 hours as needed for pain  Orders: Physical Therapy Referral (PT)  Problem # 2:  HYPERTENSION (ICD-401.9) Assessment: Deteriorated In view of distress due to pain and not taking her meds today morning, i will not recommend any changes to current regimen of antihypertensives. Will review her in 2 weeks and reassess. looking back at her previous visits she has consistently been in high BP range and I talked to her possible increase in her BP meds dose or possibly adding another med. Her updated medication list for this problem includes:     Lisinopril-hydrochlorothiazide 20-25 Mg Tabs (Lisinopril-hydrochlorothiazide) .Marland Kitchen... Take 1 tablet by mouth once a day    Metoprolol Tartrate 25 Mg Tabs (Metoprolol tartrate) .Marland Kitchen... Take 1 tablet by mouth two times a day    Amlodipine Besylate 10 Mg Tabs (Amlodipine besylate) .Marland Kitchen... Take 1 tablet by mouth once a day  BP today: 185/93 Prior BP: 162/89 (01/17/2009)  Prior 10 Yr Risk Heart Disease: 9 % (01/03/2007)  Labs Reviewed: K+: 4.1 (12/06/2008) Creat: : 0.80 (12/06/2008)   Chol: 173 (12/06/2008)   HDL: 45 (12/06/2008)   LDL: 107 (12/06/2008)   TG: 106 (12/06/2008)  Problem # 3:  SHOULDER PAIN, LEFT (ICD-719.41) Assessment: Unchanged chronic and unchanged. I will reassess her in 2 weeks after 2 weeks of PT. Her updated medication list for this problem includes:    Aspirin 81 Mg Tbec (Aspirin) .Marland Kitchen... Take 1 tablet by mouth once a day    Vicodin 5-500 Mg Tabs (Hydrocodone-acetaminophen) .Marland Kitchen... Take 1 tablet by mouth every 4 hours as needed for pain  Orders: Physical Therapy Referral (PT)  Problem # 4:  MORBID OBESITY (ICD-278.01) discussed her increase in weight and she said that she will probably discuss about a proper dietery changes the next time when her back pian improves which seems to be the primary concern at this point of time. Ht: 63 (04/03/2009)   Wt: 265.1 (04/03/2009)   BMI: 47.13 (04/03/2009)  Problem # 5:  Preventive Health Care (ICD-V70.0) discussed and deffred to the next visit.  Complete Medication List: 1)  Levothyroxine Sodium 50 Mcg Tabs (Levothyroxine sodium) .... Take 1 tablet by mouth once a day 2)  Aspirin 81 Mg Tbec (Aspirin) .... Take 1 tablet by mouth once a day 3)  Ferrous Sulfate 325 (65 Fe) Mg Tabs (Ferrous sulfate) .... Take one tablet three times a day 4)  Vicodin 5-500 Mg Tabs (Hydrocodone-acetaminophen) .... Take 1 tablet by mouth every 4 hours as needed for pain 5)  Lisinopril-hydrochlorothiazide 20-25 Mg Tabs (Lisinopril-hydrochlorothiazide) ....  Take 1 tablet by mouth once a day 6)  Metoprolol Tartrate 25 Mg Tabs (Metoprolol tartrate) .... Take 1 tablet by mouth two times a day 7)  Amlodipine Besylate 10 Mg Tabs (Amlodipine besylate) .... Take 1 tablet by mouth once a day 8)  Fluticasone Propionate 50 Mcg/act Susp (Fluticasone propionate) .... 2 sprays in each nostril twice daily 9)  Blood Pressure Monitor Misc (Misc. devices)  Patient Instructions: 1)  Please schedule a follow-up appointment in 2 weeks. 2)  It is important that you exercise  regularly at least 20 minutes 5 times a week. If you develop chest pain, have severe difficulty breathing, or feel very tired , stop exercising immediately and seek medical attention. 3)  You need to lose weight. Consider a lower calorie diet and regular exercise.  4)  Schedule a colonoscopy/sigmoidoscopy to help detect colon cancer. 5)  You need to have a Pap Smear to prevent cervical cancer. 6)  Check your Blood Pressure regularly. If it is above: 140/90 you should make an appointment. Prescriptions: FLUTICASONE PROPIONATE 50 MCG/ACT SUSP (FLUTICASONE PROPIONATE) 2 sprays in each nostril twice daily  #1 x 0   Entered and Authorized by:   Lars Mage MD   Signed by:   Lars Mage MD on 04/03/2009   Method used:   Handwritten   RxID:   [id number] VICODIN 5-500 MG TABS (HYDROCODONE-ACETAMINOPHEN) Take 1 tablet by mouth every 4 hours as needed for pain  #50 x 0   Entered and Authorized by:   Lars Mage MD   Signed by:   Lars Mage MD on 04/03/2009   Method used:   Handwritten   RxID:   [id number]   Prevention & Chronic Care Immunizations   Influenza vaccine: Not documented   Influenza vaccine deferral: Refused  (04/03/2009)    Tetanus booster: Not documented   Td booster deferral: Refused  (04/03/2009)    Pneumococcal vaccine: Not documented   Pneumococcal vaccine deferral: Not indicated  (01/17/2009)  Colorectal Screening   Hemoccult: Not documented   Hemoccult  action/deferral: Not indicated  (01/17/2009)    Colonoscopy: Not documented   Colonoscopy action/deferral: GI referral  (01/17/2009)  Other Screening   Pap smear:  Specimen Adequacy: Satisfactory for evaluation.   Interpretation/Result:Negative for intraepithelial Lesion or Malignancy.     (08/26/2006)   Pap smear action/deferral: Deferred  (04/03/2009)   Pap smear due: 09/2009    Mammogram: Assessment: BIRADS 1.  The appearance of both breasts is similar to the prior mammogram. Scattered benign-appearing calcifications in both  breasts.  No evidence of mass, suspicious microcalcification or architectural distortion in either breast.   IMPRESSION:   No evidence of malignancy in either breast.   THIS PROCEDURE WAS A DIGITAL  MAMMOGRAM   Read By:  Oliver Hum,  M.D.     Released By:  Oliver Hum,  M.D.  (12/07/2006)   Mammogram action/deferral: Deferred  (04/03/2009)   Mammogram due: 12/2007   Smoking status: never  (04/03/2009)  Lipids   Total Cholesterol: 173  (12/06/2008)   LDL: 107  (12/06/2008)   LDL Direct: Not documented   HDL: 45  (12/06/2008)   Triglycerides: 106  (12/06/2008)  Hypertension   Last Blood Pressure: 185 / 93  (04/03/2009)   Serum creatinine: 0.80  (12/06/2008)   Serum potassium 4.1  (12/06/2008)    Hypertension flowsheet reviewed?: Yes   Progress toward BP goal: Deteriorated  Self-Management Support :   Personal Goals (by the next clinic visit) :      Personal blood pressure goal: 140/90  (01/17/2009)   Patient will work on the following items until the next clinic visit to reach self-care goals:     Medications and monitoring: take my medicines every day  (04/03/2009)     Eating: drink diet soda or water instead of juice or soda, eat more vegetables, eat foods that are low in salt  (04/03/2009)    Hypertension self-management support: Education handout  (04/03/2009)   Hypertension education handout printed

## 2010-04-10 NOTE — Miscellaneous (Signed)
 Summary: Initial Summary For PT  Initial Summary For PT   Imported By: Ronal Louder 01/04/2009 16:30:36  _____________________________________________________________________  External Attachment:    Type:   Image     Comment:   External Document

## 2010-04-10 NOTE — Progress Notes (Signed)
 Summary: refill/gg  Phone Note Refill Request  on November 30, 2008 4:42 PM  Refills Requested: Medication #1:  VICODIN 5-500 MG TABS Take 1 tablet by mouth every 4 hours as needed for pain   Last Refilled: 08/07/2008  Method Requested: Fax to Local Pharmacy Initial call taken by: Sharman Lama RN,  November 30, 2008 4:42 PM  Follow-up for Phone Call       Follow-up by: Alm Needle MD,  November 30, 2008 4:54 PM    Prescriptions: VICODIN 5-500 MG TABS (HYDROCODONE -ACETAMINOPHEN ) Take 1 tablet by mouth every 4 hours as needed for pain  #30 x 1   Entered and Authorized by:   Alm Needle MD   Signed by:   Alm Needle MD on 11/30/2008   Method used:   Telephoned to ...       [facility] [address] [phone]* (retail)       [address]       [place], [place]  [address]       Ph: [phone]       Fax: [phone]   RxID:   [id number]   Appended Document: refill/gg faxed in

## 2010-04-10 NOTE — Letter (Signed)
Summary: [facility]  [facility]   Imported By: Shon Hough 09/24/2009 16:39:53  _____________________________________________________________________  External Attachment:    Type:   Image     Comment:   External Document

## 2010-04-10 NOTE — Miscellaneous (Signed)
Summary: REHAB-INITIAL SUMMARY FOR PT  REHAB-INITIAL SUMMARY FOR PT   Imported By: Shon Hough 03/21/2010 11:19:38  _____________________________________________________________________  External Attachment:    Type:   Image     Comment:   External Document

## 2010-04-10 NOTE — Medication Information (Signed)
Summary: POWER WHEELCHAIR  POWER WHEELCHAIR   Imported By: Margie Billet 12/19/2009 15:28:17  _____________________________________________________________________  External Attachment:    Type:   Image     Comment:   External Document

## 2010-04-10 NOTE — Consult Note (Signed)
 Summary: [employer] [facility]   Imported By: Ronal Louder 11/16/2006 11:24:15  _____________________________________________________________________  External Attachment:    Type:   Image     Comment:   External Document

## 2010-04-10 NOTE — Progress Notes (Signed)
Summary: PAP result information  Phone Note Outgoing Call   Summary of Call: PAP  information found in EChart for PAP collected August 26, 2006  Alric Quan  March 28, 2010 2:25 PM

## 2010-04-10 NOTE — Progress Notes (Signed)
 Summary: Refill/gh  Phone Note Refill Request Message from:  Fax from Pharmacy on May 23, 2008 10:24 AM  Pharmacy wants to substitute Hydrocortisone 25 mg supporitories or Proctosol HC 2.5 % cream.   Method Requested: Electronic Initial call taken by: Kenneth Goldberg RN,  May 23, 2008 10:26 AM  Follow-up for Phone Call        Rx changed and sent.  Follow-up by: Inocente Lindsay MD,  May 23, 2008 6:09 PM    New/Updated Medications: HYDROCORTISONE 2.5 % CREA (HYDROCORTISONE) Apply two times a day as needed and after bowel movement. HYDROCORTISONE ACETATE 25 MG SUPP (HYDROCORTISONE ACETATE) Apply two times a day as needed   Prescriptions: HYDROCORTISONE ACETATE 25 MG SUPP (HYDROCORTISONE ACETATE) Apply two times a day as needed  #1 box x 3   Entered and Authorized by:   Inocente Jailon Schaible MD   Signed by:   Inocente Lindsay MD on 05/23/2008   Method used:   Electronically to        [facility] [phone]* (retail)       [address]       [place], [place]  [address]       Ph: [phone]       Fax: [phone]   RxID:   [id number] HYDROCORTISONE 2.5 % CREA (HYDROCORTISONE) Apply two times a day as needed and after bowel movement.  #1 tube x 3   Entered and Authorized by:   Inocente Lindsay MD   Signed by:   Inocente Lindsay MD on 05/23/2008   Method used:   Electronically to        [facility] [phone]* (retail)       [address]       [place], [place]  [address]       Ph: [phone]       Fax: [phone]   RxID:   [id number]

## 2010-04-10 NOTE — Miscellaneous (Signed)
 Summary: [facility]   Imported By: Ronal Louder 01/23/2008 16:54:10  _____________________________________________________________________  External Attachment:    Type:   Image     Comment:   External Document
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
## 2010-04-10 NOTE — Letter (Signed)
 Summary: Ophthalmology: Dr. Cleatus  Ophthalmology: Dr. Cleatus   Imported By: Ronal Louder 12/13/2007 12:04:36  _____________________________________________________________________  External Attachment:    Type:   Image     Comment:   External Document

## 2010-04-10 NOTE — Assessment & Plan Note (Signed)
 Summary: CHECKUP/ SB.   Vital Signs:  Patient Profile:   55 Years Old Female Height:     63 inches (160.02 cm) Weight:      288.0 pounds (130.91 kg) BMI:     51.20 Temp:     97.4 degrees F (36.33 degrees C) oral Pulse rate:   68 / minute BP sitting:   163 / 87  (right arm)  Pt. in pain?   yes    Location:   knees    Intensity:   6    Type:       aching  Vitals Entered ByBETHA Dannie Footman (May 12, 2007 9:28 AM)              Is Patient Diabetic? No Nutritional Status BMI of 25 - 29 = overweight  Does patient need assistance? Functional Status Self care Ambulation Impaired:Risk for fall     Chief Complaint:  Cold & URI symptoms knee pain.  History of Present Illness: 55 yo woman with Primary Sjogren's syndrome with Sicca complex, obesity, HTN, chronic LBP, OSA, and abnormal uterine bleeding who a routine visit. Says she is feeling better overall. She does continue to have right knee and hip pain, but thinks that it is much improved from before.  She is having more uterine bleeding intermittently again. She took her last Provera pill about a week ago. She was taking a 5 mg daily trial period, and ran out about a week ago. She is having Headaches with the Provera. She is asking about fluid retention, because she says that she noticed some intermittent ankle swelling in the past month as well. Finally, she is asking about weight fluctuations on her new medicines which include the Provera already mentioned and her Prednisone  2.5 mg daily which was resumed last visit. She is taking her Plaquenil , but only once daily. She has not seen [facility] in some time now.   Dr. Nelwyn Beagle is her Gynecologist.   Has had cold for 3 wks. Has runny nose, congestion in her sinuses, sore throat and ear ache. Also has a constant cough.      Prior Medications Reviewed Using: Patient Recall  Updated Prior Medication List: DARVOCET-N 100 100-650 MG TABS (PROPOXYPHENE N-APAP) Take one tablet every  six hours as needed for pain HYDROCHLOROTHIAZIDE  25 MG TABS (HYDROCHLOROTHIAZIDE ) Take 1 tablet by mouth once a day LEVOTHYROXINE  SODIUM 50 MCG TABS (LEVOTHYROXINE  SODIUM) Take 1 tablet by mouth once a day CYCLOBENZAPRINE  HCL 10 MG TABS (CYCLOBENZAPRINE  HCL) at bedtime as needed OSCAL 500/200 D-3  TABS (CALCIUM -VITAMIN D TABS) Take 1 tablet by mouth two times a day ASPIRIN  81 MG TBEC (ASPIRIN ) Take 1 tablet by mouth once a day METOPROLOL  TARTRATE 25 MG TABS (METOPROLOL  TARTRATE) take one tablet two times a day FERROUS SULFATE  325 (65 FE) MG TABS (FERROUS SULFATE ) Take one tablet three times a day MEDROXYPROGESTERONE ACETATE 5 MG  TABS (MEDROXYPROGESTERONE ACETATE) Take 1 tablet by mouth once a day GUAIFENESIN  400 MG  TABS (GUAIFENESIN ) take one tablet every 6 hours as needed for cough. ALLEGRA  180 MG  TABS (FEXOFENADINE  HCL) Take 1 tablet by mouth once a day  Current Allergies (reviewed today): ! * AMLODIPINE  ! * ALEVE ! * COCUNAX  Past Medical History:    Reviewed history from 03/17/2006 and no changes required:       Primary Sjogren's syndrome with Sicca complex       -  anti-Ro +; ANA>1:1280 in homogenpattern       -  -  dsDNA/ RF/ antiSmith/RNP/C3-4 comp/ La/ Jo1/ Sceleroderma/ centromere       -  - HIV/ ACE/ Hep B/C       -  nl CXR 2/05       - Schirmer salivary gland test not done       -  Symptom Rx; eye drops/ prednisone / plaquenil        -  Referral to [facility] rheum, Dr. Norva Isaac 3/07. Saw Dr. Zieminski, but stopped 2/2 costs       Back pain-chronic low back L4-L5 discectomy; 11/99. Degenerative thoracic spondylotic changes 4/07       Hypertension       -  no LVH EKG 7/05       - nl M/C ratio 4/07       Morbid Obesity- ht unknown       Endometrial hyperplasia stripe > 6mm->7MM; GYN referral and BX  pending?        Obstructive sleep apnea-severe 09/2003 RD 161 per hr./cpap 18 cwp       Hypothyroidism-09/1998       Microcytic anemia-09/1998. Hgb 10.5/ MCV 76. Needs Ferritin  to determine ACD vs IDA       Menorrhagia-10/1998       Uterine bleeding       Lower extremity edema       -  echo EF 55-65% w/o evidence of Dias Dysfx       -  M/C ratio nl 4/07       -  Hgb 10.5 4/07, MCV 76       Polyarthalgias (knee/back/angle)w/ dx of fibromyalgia? given by [facility] rheum       -  Knee pain, chronic secondary to obesity, fibromyalgia,  and Sjogren's as per above       CP 10/07       -  neg adenosine myoview  Past Surgical History:    Reviewed history from 03/17/2006 and no changes required:       Tonsillectomy, age 69 (62)   Family History:    Mom- 16's from ovarian cancer. HTN.    Dad- 60's from brain cancer.     Sister died from breast cancer.     Family History Ovarian cancer    Family History Breast cancer 1st degree relative <50   Risk Factors: Tobacco use:  never Alcohol use:  no Exercise:  no Seatbelt use:  100 %  Mammogram History:    Date of Last Mammogram:  12/07/2006  PAP Smear History:    Date of Last PAP Smear:  11/18/2004    Physical Exam  General:     alert, well-nourished, and overweight-appearing. She is in good spirits, and does not appear to be in any distress today. Head:     No sinus tenderness.  Ears:     R ear normal and L ear normal.   Mouth:     good dentition.  Clear without post nasal discharge.  Lungs:     Normal respiratory effort, chest expands symmetrically. Lungs are clear to auscultation, no crackles or wheezes. Heart:     Normal rate and regular rhythm. S1 and S2 normal without gallop or murmur    Impression & Recommendations:  Problem # 1:  DYSFUNCTIONAL UTERINE BLEEDING (ICD-626.8) Assessment: Unchanged She did not keep her last GYN appt.  Will set her up again with Dr. Nelwyn Beagle or another available Gynecologist. Continue her provera for now.   Problem # 2:  KNEE PAIN, CHRONIC (ICD-719.46)  Assessment: Deteriorated Resume low dose Prednisone  2.5 mg daily.  Her updated medication list for this  problem includes:    Darvocet-n 100 100-650 Mg Tabs (Propoxyphene n-apap) .SABRA... Take one tablet every six hours as needed for pain    Cyclobenzaprine  Hcl 10 Mg Tabs (Cyclobenzaprine  hcl) .SABRA... At bedtime as needed    Aspirin  81 Mg Tbec (Aspirin ) .SABRA... Take 1 tablet by mouth once a day   Problem # 3:  HYPOTHYROIDISM (ICD-244.9) Assessment: Comment Only No changes today.  Her updated medication list for this problem includes:    Levothyroxine  Sodium 50 Mcg Tabs (Levothyroxine  sodium) .SABRA... Take 1 tablet by mouth once a day  Labs Reviewed: TSH: 2.982 (01/03/2007)    Chol: 171 (07/29/2006)   HDL: 44 (07/29/2006)   LDL: 103 (07/29/2006)   TG: 122 (07/29/2006)   Problem # 4:  ANEMIA, IRON DEFICIENCY NOS (ICD-280.9) Assessment: Comment Only Check Hemoglobin today.  Her updated medication list for this problem includes:    Ferrous Sulfate  325 (65 Fe) Mg Tabs (Ferrous sulfate ) .SABRA... Take one tablet three times a day Hgb: 10.7 (01/03/2007)   Hct: 34.9 (01/03/2007)   RDW: 15.4 (01/03/2007)   MCV: 76.0 (01/03/2007)   MCHC: 30.7 (01/03/2007) Ferritin: 41 (01/03/2007) TSH: 2.982 (01/03/2007)   Problem # 5:  HYPERTENSION (ICD-401.9) Assessment: Deteriorated In pain. Well controlled last time. Recheck on followup.  Her updated medication list for this problem includes:    Hydrochlorothiazide  25 Mg Tabs (Hydrochlorothiazide ) .SABRA... Take 1 tablet by mouth once a day    Metoprolol  Tartrate 25 Mg Tabs (Metoprolol  tartrate) .SABRA... Take one tablet two times a day  BP today: 163/87 Prior BP: 138/88 (01/03/2007)  Prior 10 Yr Risk Heart Disease: 9 % (01/03/2007)  Labs Reviewed: Creat: 0.90 (07/29/2006) Chol: 171 (07/29/2006)   HDL: 44 (07/29/2006)   LDL: 103 (07/29/2006)   TG: 122 (07/29/2006)   Problem # 6:  SICCA SYNDROME (ICD-710.2) Assessment: Deteriorated She has not kept up with appts with [facility]. She also is out of prednisone  and plaquenil . I will resume prednisone  today at low dose. I will  not resume plaquenil  at this time (until adherence documented).    Problem # 7:  Preventive Health Care (ICD-V70.0) TC:171 (07/29/2006 7:16:00 PM) HDL:44 (07/29/2006 7:16:00 PM) LDL:103 (07/29/2006 7:16:00 PM) Trig:122 (07/29/2006 7:16:00 PM)  SGOT:16 (07/29/2006 7:16:00 PM) SPGT:10 (07/29/2006 7:16:00 PM)   Mammogram:Assessment: BIRADS 1. The appearance of both breasts is similar to the prior mammogram. Scattered benign-appearing calcifications in both breasts.  No evidence of mass, suspicious microcalcification or architectural distortion in either breast.  IMPRESSION:  No evidence of malignancy in either breast. Read By:  Shlomo Devere PARAS,  M.D.     Released By:  Shlomo Devere PARAS,  M.D. (12/07/2006 5:14:44 PM) Pap:11/18/2004   Complete Medication List: 1)  Darvocet-n 100 100-650 Mg Tabs (Propoxyphene n-apap) .... Take one tablet every six hours as needed for pain 2)  Hydrochlorothiazide  25 Mg Tabs (Hydrochlorothiazide ) .... Take 1 tablet by mouth once a day 3)  Levothyroxine  Sodium 50 Mcg Tabs (Levothyroxine  sodium) .... Take 1 tablet by mouth once a day 4)  Cyclobenzaprine  Hcl 10 Mg Tabs (Cyclobenzaprine  hcl) .... At bedtime as needed 5)  Oscal 500/200 D-3 Tabs (Calcium -vitamin d tabs) .... Take 1 tablet by mouth two times a day 6)  Aspirin  81 Mg Tbec (Aspirin ) .... Take 1 tablet by mouth once a day 7)  Metoprolol  Tartrate 25 Mg Tabs (Metoprolol  tartrate) .... Take one tablet two times a day 8)  Ferrous Sulfate  325 (65 Fe) Mg Tabs (Ferrous sulfate ) .... Take one tablet three times a day 9)  Medroxyprogesterone Acetate 5 Mg Tabs (Medroxyprogesterone acetate) .... Take 1 tablet by mouth once a day 10)  Guaifenesin  400 Mg Tabs (Guaifenesin ) .... Take one tablet every 6 hours as needed for cough. 11)  Allegra  180 Mg Tabs (Fexofenadine  hcl) .... Take 1 tablet by mouth once a day 12)  Prednisone  2.5 Mg Tabs (Prednisone ) .... Take 1 tablet by mouth once a day  Other  Orders: T-Comprehensive Metabolic Panel [id number]) T-Lipid Profile ([id number]) T-CBC No Diff ([id number])   Patient Instructions: 1)  Take Prednisone  as directed. 2)  Keep your GYNECOLOGy appointment with Dr. Nelwyn Beagle.  3)  Please schedule a follow-up appointment in 2 weeks.    Prescriptions: DARVOCET-N 100 100-650 MG TABS (PROPOXYPHENE N-APAP) Take one tablet every six hours as needed for pain  #120 x 0   Entered and Authorized by:   Rolene Cleveland MD   Signed by:   Rolene Cleveland MD on 05/12/2007   Method used:   Print then Give to Patient   RxID:   [id number] PREDNISONE  2.5 MG  TABS (PREDNISONE ) Take 1 tablet by mouth once a day  #31 x 3   Entered and Authorized by:   Rolene Cleveland MD   Signed by:   Rolene Cleveland MD on 05/12/2007   Method used:   Print then Give to Patient   RxID:   [id number] ALLEGRA  180 MG  TABS (FEXOFENADINE  HCL) Take 1 tablet by mouth once a day  #31 x 1   Entered and Authorized by:   Rolene Cleveland MD   Signed by:   Rolene Cleveland MD on 05/12/2007   Method used:   Print then Give to Patient   RxID:   [id number] GUAIFENESIN  400 MG  TABS (GUAIFENESIN ) take one tablet every 6 hours as needed for cough.  #80 x 0   Entered and Authorized by:   Rolene Cleveland MD   Signed by:   Rolene Cleveland MD on 05/12/2007   Method used:   Print then Give to Patient   RxID:   [id number] METOPROLOL  TARTRATE 25 MG TABS (METOPROLOL  TARTRATE) take one tablet two times a day  #62 x 5   Entered and Authorized by:   Rolene Cleveland MD   Signed by:   Rolene Cleveland MD on 05/12/2007   Method used:   Print then Give to Patient   RxID:   [id number] CYCLOBENZAPRINE  HCL 10 MG TABS (CYCLOBENZAPRINE  HCL) at bedtime as needed  #31 x 3   Entered and Authorized by:   Rolene Cleveland MD   Signed by:   Rolene Cleveland MD on 05/12/2007   Method used:   Print then Give to Patient   RxID:   [id number] LEVOTHYROXINE  SODIUM 50 MCG TABS  (LEVOTHYROXINE  SODIUM) Take 1 tablet by mouth once a day  #30 x 5   Entered and Authorized by:   Rolene Cleveland MD   Signed by:   Rolene Cleveland MD on 05/12/2007   Method used:   Print then Give to Patient   RxID:   [id number] HYDROCHLOROTHIAZIDE  25 MG TABS (HYDROCHLOROTHIAZIDE ) Take 1 tablet by mouth once a day  #30 x 5   Entered and Authorized by:   Rolene Cleveland MD   Signed by:   Rolene Cleveland MD on 05/12/2007   Method used:   Print then Give to Patient   RxID:   [id number]  ]

## 2010-04-10 NOTE — Miscellaneous (Signed)
 [facility]: [facility]   Imported By: Ronal Louder 02/07/2007 09:49:53  _____________________________________________________________________  External Attachment:    Type:   Image     Comment:   External Document

## 2010-04-10 NOTE — Progress Notes (Signed)
Summary: phone/gg  Phone Note Call from Patient   Caller: Patient Summary of Call: Call from pt asking for the name of attending for [employer] paper work.  They can't use residents names. Initial call taken by: Merrie Roof RN,  February 04, 2010 2:49 PM

## 2010-04-10 NOTE — Assessment & Plan Note (Signed)
 Summary: 2wk fu/est/vs   Vital Signs:  Patient profile:   55 year old female Height:      63 inches (160.02 cm) Weight:      274.8 pounds (124.91 kg) BMI:     48.85 Temp:     97.9 degrees F (36.61 degrees C) Pulse rate:   90 / minute BP sitting:   156 / 97  (left arm) Cuff size:   large  Vitals Entered By: Ivin Holland NT II (June 07, 2008 4:10 PM) CC: MEDICATION REFILL ON ALL  /  FOLLOW UP ON MEDICATION CHANGE / PER PATIENT-NEW MED. IS MAKING HER SICK. / CHECK LEFT EAR Is Patient Diabetic? No Pain Assessment Patient in pain? yes     Location: back Intensity:      6 Type: aching Onset of pain  Chronic Nutritional Status BMI of > 30 = obese  Have you ever been in a relationship where you felt threatened, hurt or afraid?No   Does patient need assistance? Functional Status Self care Ambulation Normal Comments CHRONIC BACK PAIN /  MEDICATION REFILL /  FOLLOW UP ON MEDICATION CHANGE /  NEW MED MAKE PATIENT SICK(PER PATIENT)  CHECK LEFT EAR.   Primary Care Provider:  Inocente Lindsay MD  CC:  MEDICATION REFILL ON ALL  /  FOLLOW UP ON MEDICATION CHANGE / PER PATIENT-NEW MED. IS MAKING HER SICK. / CHECK LEFT EAR.  History of Present Illness: Nancy [name] is a 55 yo woman who is in today for follow up of presumed benign positional vertigo. I saw pt in clnic 2 weeks ago for vertigo. Started on meclizine. Today, she says she is somewhat better though still having episodes. The episodes are shorter in duration and occurring less frequently. She is also complaining of decreased hearing in her left ear which she says just started with the vertigo. Denies fever, rhinnorhea, cough, nasal congestion, ear pain.   Preventive Screening-Counseling & Management     Smoking Status: never     Does Patient Exercise: yes     Type of exercise: WALKING     Exercise (avg: min/session):     Times/week:   1-2  Current Medications (verified): 1)  Hydrochlorothiazide  25 Mg Tabs  (Hydrochlorothiazide ) .... Take 1 Tablet By Mouth Once A Day 2)  Levothyroxine  Sodium 50 Mcg Tabs (Levothyroxine  Sodium) .... Take 1 Tablet By Mouth Once A Day 3)  Cyclobenzaprine  Hcl 10 Mg Tabs (Cyclobenzaprine  Hcl) .... At Bedtime As Needed 4)  Oscal 500/200 D-3  Tabs (Calcium -Vitamin D Tabs) .... Take 1 Tablet By Mouth Two Times A Day 5)  Aspirin  81 Mg Tbec (Aspirin ) .... Take 1 Tablet By Mouth Once A Day 6)  Metoprolol  Tartrate 25 Mg Tabs (Metoprolol  Tartrate) .... Take One Tablet Two Times A Day 7)  Ferrous Sulfate  325 (65 Fe) Mg Tabs (Ferrous Sulfate ) .... Take One Tablet Three Times A Day 8)  Hydrocortisone 2.5 % Crea (Hydrocortisone) .... Apply Two Times A Day As Needed and After Bowel Movement. 9)  Hydrocortisone Acetate 25 Mg Supp (Hydrocortisone Acetate) .... Apply Two Times A Day As Needed 10)  Antivert 25 Mg Tabs (Meclizine Hcl) .... Take 1 Tablet By Mouth Three Times A Day 11)  Vicodin 5-500 Mg Tabs (Hydrocodone -Acetaminophen ) .... Take 1 Tablet By Mouth Every 4 Hours As Needed For Pain  Allergies (verified): 1)  ! * Amlodipine  2)  ! * Aleve 3)  ! * Cocunax  Social History:    Does Patient Exercise:  yes  Review of Systems ENT:  Complains of decreased hearing; denies ear discharge, earache, hoarseness, nasal congestion, postnasal drainage, ringing in ears, and sore throat; left ear. Neuro:  Complains of sensation of room spinning; denies disturbances in coordination, falling down, headaches, memory loss, numbness, visual disturbances, and weakness.  Physical Exam  General:  alert and overweight-appearing.   Ears:  R ear normal and L ear normal.   Nose:  no nasal discharge and no sinus percussion tenderness.   Mouth:  pharynx pink and moist and no erythema.     Impression & Recommendations:  Problem # 1:  BENIGN PAROXYSMAL POSITIONAL VERTIGO (ICD-386.11) Clinically consistent with BPPV and seems to be improving over time. However, with hearing loss, will check  uncontrasted head CT to include sinuses to rule out middle ear fluid, sinusitis or other cause of vertigo and hearing loss.  If  CT negative, will refer pt for therapy (otolith maneuvers).  Her updated medication list for this problem includes:    Antivert 25 Mg Tabs (Meclizine hcl) .SABRA... Take 1 tablet by mouth three times a day  Complete Medication List: 1)  Hydrochlorothiazide  25 Mg Tabs (Hydrochlorothiazide ) .... Take 1 tablet by mouth once a day 2)  Levothyroxine  Sodium 50 Mcg Tabs (Levothyroxine  sodium) .... Take 1 tablet by mouth once a day 3)  Cyclobenzaprine  Hcl 10 Mg Tabs (Cyclobenzaprine  hcl) .... At bedtime as needed 4)  Oscal 500/200 D-3 Tabs (Calcium -vitamin d tabs) .... Take 1 tablet by mouth two times a day 5)  Aspirin  81 Mg Tbec (Aspirin ) .... Take 1 tablet by mouth once a day 6)  Metoprolol  Tartrate 25 Mg Tabs (Metoprolol  tartrate) .... Take one tablet two times a day 7)  Ferrous Sulfate  325 (65 Fe) Mg Tabs (Ferrous sulfate ) .... Take one tablet three times a day 8)  Hydrocortisone 2.5 % Crea (Hydrocortisone) .... Apply two times a day as needed and after bowel movement. 9)  Hydrocortisone Acetate 25 Mg Supp (Hydrocortisone acetate) .... Apply two times a day as needed 10)  Antivert 25 Mg Tabs (Meclizine hcl) .... Take 1 tablet by mouth three times a day 11)  Vicodin 5-500 Mg Tabs (Hydrocodone -acetaminophen ) .... Take 1 tablet by mouth every 4 hours as needed for pain  Other Orders: CT (CT)  Patient Instructions: 1)  Please schedule a follow-up appointment in 2 weeks. 2)  We will call you with the appointment for your X-ray.

## 2010-04-10 NOTE — Miscellaneous (Signed)
 Summary: Medication Contract  Medication Contract   Imported By: Ronal Louder 05/23/2008 16:09:57  _____________________________________________________________________  External Attachment:    Type:   Image     Comment:   External Document

## 2010-04-10 NOTE — Miscellaneous (Signed)
Summary: Medical Modalities: CMN  Medical Modalities: CMN   Imported By: Florinda Marker 04/16/2009 13:58:46  _____________________________________________________________________  External Attachment:    Type:   Image     Comment:   External Document

## 2010-04-10 NOTE — Miscellaneous (Signed)
 Summary: [facility]: Orders  [facility]: Orders   Imported By: Ronal Louder 09/23/2007 14:56:40  _____________________________________________________________________  External Attachment:    Type:   Image     Comment:   External Document

## 2010-04-10 NOTE — Miscellaneous (Signed)
" °  Clinical Lists Changes  Observations: Added new observation of PAP DUE: 09/2009 (05/24/2008 15:52) Added new observation of PAP SMEAR:  Specimen Adequacy: Satisfactory for evaluation.   Interpretation/Result:Negative for intraepithelial Lesion or Malignancy.    (08/26/2006 15:53)      Pap Smear  Procedure date:  08/26/2006  Findings:       Specimen Adequacy: Satisfactory for evaluation.   Interpretation/Result:Negative for intraepithelial Lesion or Malignancy.     Comments:      Repeat Pap in 3 years.    Procedures Next Due Date:    Pap Smear: 09/2009   Pap Smear  Procedure date:  08/26/2006  Findings:       Specimen Adequacy: Satisfactory for evaluation.   Interpretation/Result:Negative for intraepithelial Lesion or Malignancy.     Comments:      Repeat Pap in 3 years.    Procedures Next Due Date:    Pap Smear: 09/2009 "

## 2010-04-10 NOTE — Consult Note (Signed)
 Summary: [facility]  [facility]   Imported By: Ronal Louder 12/12/2007 12:36:05  _____________________________________________________________________  External Attachment:    Type:   Image     Comment:   External Document

## 2010-04-10 NOTE — Progress Notes (Signed)
Summary: phone/gg  Phone Note Call from Patient   Caller: Patient Summary of Call: Pt states her C-PAP machine is broken and she needs  a new one.  She uses [facility] Initial call taken by: Merrie Roof RN,  June 24, 2009 1:10 PM  Follow-up for Phone Call        I tried calling up patient but could not connect. Please call in [facility] with a new prescription of CPAP. Let me know what needs to be done from my side. If patient needs to pick up prescription, please schedule for a follow up. Follow-up by: Lars Mage MD,  June 24, 2009 7:59 PM  Additional Follow-up for Phone Call Additional follow up Details #1::        I talked with pt and her complaint with the C PAP machine is it's loosing air.  I told her Medicare only replaces these machines every 5 years.  [facility] didnot supply this machine so she needs to go back to the company that gave her the machine and see if they can fix the problem.   If she is not able to get this fixed please call us back Additional Follow-up by: Merrie Roof RN,  June 25, 2009 3:30 PM

## 2010-04-10 NOTE — Progress Notes (Signed)
 Summary: med refills/hla  Phone Note Refill Request   Refills Requested: Medication #1:  HYDROCHLOROTHIAZIDE  25 MG TABS Take 1 tablet by mouth once a day   Dosage confirmed as above?Dosage Confirmed   Last Refilled: 09/14/2005   Notes: Cr 0.85, K3.9 -11/19/05  Medication #2:  LEVOTHYROXINE  SODIUM 50 MCG TABS Take 1 tablet by mouth once a day   Dosage confirmed as above?Dosage Confirmed   Last Refilled: 09/14/2005   Notes: tsh 3.45- 11/19/05  Method Requested: Fax to Local Pharmacy Initial call taken by: Sherrilyn Rhein RN,  April 05, 2006 10:45 AM  Follow-up for Phone Call        Refill approved-nurse to complete. Follow-up by: Rolene Cleveland MD,  April 05, 2006 1:59 PM  Additional Follow-up for Phone Call Additional follow up Details #1::        Rx faxed to pharmacy Additional Follow-up by: Sherrilyn Rhein RN,  April 05, 2006 5:59 PM  New/Updated Medications: LISINOPRIL  20 MG TABS (LISINOPRIL ) Take 1 tablet by mouth once a day  New/Updated Medications: LISINOPRIL  20 MG TABS (LISINOPRIL ) Take 1 tablet by mouth once a day  Prescriptions: HYDROCHLOROTHIAZIDE  25 MG TABS (HYDROCHLOROTHIAZIDE ) Take 1 tablet by mouth once a day  #30 x 5   Entered and Authorized by:   Rolene Cleveland MD   Signed by:   Rolene Cleveland MD on 04/05/2006   Method used:   Telephoned to ...       [facility] [address]       [address]       [place], [place]  [address]  [place]        Ph: [phone]       Fax: [phone]   RxID:   [id number] LEVOTHYROXINE  SODIUM 50 MCG TABS (LEVOTHYROXINE  SODIUM) Take 1 tablet by mouth once a day  #30 x 5   Entered and Authorized by:   Rolene Cleveland MD   Signed by:   Rolene Cleveland MD on 04/05/2006   Method used:   Telephoned to ...       [facility] [address]       [address]       [place], [place]  [address]  [place]        Ph: [phone]       Fax: [phone]   RxID:   [id number]

## 2010-04-10 NOTE — Miscellaneous (Signed)
 Summary: D/Charge Summary For PT Services  D/Charge Summary For PT Services   Imported By: Ronal Louder 07/26/2008 14:37:41  _____________________________________________________________________  External Attachment:    Type:   Image     Comment:   External Document

## 2010-04-10 NOTE — Miscellaneous (Signed)
Summary: Medical Modalities: CMN  Medical Modalities: CMN   Imported By: Florinda Marker 03/11/2009 14:38:19  _____________________________________________________________________  External Attachment:    Type:   Image     Comment:   External Document

## 2010-04-10 NOTE — Progress Notes (Signed)
 Summary: med refill/wl  Phone Note Refill Request Message from:  Fax from Pharmacy on December 06, 2006 9:19 AM  Refills Requested: Medication #1:  DARVOCET-N 100 100-650 MG TABS Take one tablet every six hours as needed for pain   Last Refilled: 11/08/2005 Pharmacy does not have a record for a new Rx from 12/02/06. Refill quanity charted as #0.   Called patient - she stated the nurse had called in all of her scripts besides the Darvocet.  Patient states she has been without it all weekend. Please clarify.   Method Requested: Fax to Local Pharmacy Initial call taken by: Apolinar Higashi RN,  December 06, 2006 9:20 AM  Follow-up for Phone Call        Refill approved-nurse to complete Follow-up by: Rolene Cleveland MD,  December 06, 2006 4:30 PM  Additional Follow-up for Phone Call Additional follow up Details #1::        Rx faxed to pharmacy Additional Follow-up by: Apolinar Higashi RN,  December 06, 2006 5:07 PM      Prescriptions: DARVOCET-N 100 100-650 MG TABS (PROPOXYPHENE N-APAP) Take one tablet every six hours as needed for pain  #120 x 3   Entered and Authorized by:   Rolene Cleveland MD   Signed by:   Rolene Cleveland MD on 12/06/2006   Method used:   Telephoned to ...       [address]       [address]       [place], [place]    [place]        Ph: [phone]       Fax: [phone]   RxID:   [id number]

## 2010-04-10 NOTE — Assessment & Plan Note (Signed)
 Summary: 2WK FU/EST/VS   Vital Signs:  Patient Profile:   55 Years Old Female Height:     63 inches (160.02 cm) Weight:      285.1 pounds (129.59 kg) BMI:     50.69 Temp:     98.2 degrees F (36.78 degrees C) oral Pulse rate:   68 / minute BP sitting:   130 / 78  (right arm)  Pt. in pain?   yes    Location:   lt side    Intensity:   10    Type:       sharp  Vitals Entered By: Gaetana Pan RN (May 31, 2007 2:16 PM)              Is Patient Diabetic? No Nutritional Status BMI of > 30 = obese  Have you ever been in a relationship where you felt threatened, hurt or afraid?Unable to ask;family with pt.  Comments in wheelchair     Chief Complaint:  follow-up from last visit; lt sided pain.  History of Present Illness: 55 yo woman with Primary Sjogren's syndrome with Sicca complex, obesity, HTN, chronic LBP, OSA, and abnormal uterine bleeding   She is taking her Prednisone  2.5 mg daily at this time. She has been taking it since our last visit on 05/12/07. Today is 05/31/07, and she states that she has had no improvement in her pain. Since last week, she instead says that her pain is worse. Today, she states that it is 10/10, and she is tearful. Her pain is primarily in her lower back and her left hip, leg, side. She has not had any imaging of her back since 2005 (MRI). She states that her pain is similar to what it was in 1999 when she had to get fusion of her back.     Updated Prior Medication List: DARVOCET-N 100 100-650 MG TABS (PROPOXYPHENE N-APAP) Take one tablet every six hours as needed for pain HYDROCHLOROTHIAZIDE  25 MG TABS (HYDROCHLOROTHIAZIDE ) Take 1 tablet by mouth once a day LEVOTHYROXINE  SODIUM 50 MCG TABS (LEVOTHYROXINE  SODIUM) Take 1 tablet by mouth once a day CYCLOBENZAPRINE  HCL 10 MG TABS (CYCLOBENZAPRINE  HCL) at bedtime as needed OSCAL 500/200 D-3  TABS (CALCIUM -VITAMIN D TABS) Take 1 tablet by mouth two times a day ASPIRIN  81 MG TBEC (ASPIRIN ) Take 1 tablet by  mouth once a day METOPROLOL  TARTRATE 25 MG TABS (METOPROLOL  TARTRATE) take one tablet two times a day FERROUS SULFATE  325 (65 FE) MG TABS (FERROUS SULFATE ) Take one tablet three times a day MEDROXYPROGESTERONE ACETATE 5 MG  TABS (MEDROXYPROGESTERONE ACETATE) Take 1 tablet by mouth once a day GUAIFENESIN  400 MG  TABS (GUAIFENESIN ) take one tablet every 6 hours as needed for cough. ALLEGRA  180 MG  TABS (FEXOFENADINE  HCL) Take 1 tablet by mouth once a day PREDNISONE  2.5 MG  TABS (PREDNISONE ) Take 1 tablet by mouth once a day PREDNISONE  10 MG  TABS (PREDNISONE ) Take one tablet daily for 5 days, then take 1/2 tablet 6 days. After 11 days, then resume 2.5 mg daily.  Current Allergies: ! * AMLODIPINE  ! * ALEVE ! * COCUNAX  Past Medical History:    Reviewed history from 03/17/2006 and no changes required:       Primary Sjogren's syndrome with Sicca complex       -  anti-Ro +; ANA>1:1280 in homogenpattern       -  -dsDNA/ RF/ antiSmith/RNP/C3-4 comp/ La/ Jo1/ Sceleroderma/ centromere       -  -  HIV/ ACE/ Hep B/C       -  nl CXR 2/05       - Schirmer salivary gland test not done       -  Symptom Rx; eye drops/ prednisone / plaquenil        -  Referral to [facility] rheum, Dr. Norva Isaac 3/07. Saw Dr. Zieminski, but stopped 2/2 costs       Back pain-chronic low back L4-L5 discectomy; 11/99. Degenerative thoracic spondylotic changes 4/07       Hypertension       -  no LVH EKG 7/05       - nl M/C ratio 4/07       Morbid Obesity- ht unknown       Endometrial hyperplasia stripe > 72mm->7MM; GYN referral and BX  pending?        Obstructive sleep apnea-severe 09/2003 RD 161 per hr./cpap 18 cwp       Hypothyroidism-09/1998       Microcytic anemia-09/1998. Hgb 10.5/ MCV 76. Needs Ferritin to determine ACD vs IDA       Menorrhagia-10/1998       Uterine bleeding       Lower extremity edema       -  echo EF 55-65% w/o evidence of Dias Dysfx       -  M/C ratio nl 4/07       -  Hgb 10.5 4/07, MCV 76        Polyarthalgias (knee/back/angle)w/ dx of fibromyalgia? given by [facility] rheum       -  Knee pain, chronic secondary to obesity, fibromyalgia,  and Sjogren's as per above       CP 10/07       -  neg adenosine myoview  Past Surgical History:    Reviewed history from 03/17/2006 and no changes required:       Tonsillectomy, age 51 (72)   Social History:    Her daugther often comes with her to visits. She also has a nurse, children's (whose name is Lynda as well).    Risk Factors: Tobacco use:  never Alcohol use:  no Exercise:  no Seatbelt use:  100 %  Mammogram History:    Date of Last Mammogram:  12/07/2006  PAP Smear History:    Date of Last PAP Smear:  11/18/2004    Physical Exam  General:     Tearful in moderate distress. Sitting in wheelchair and has difficulty standing without assistance. I did have her stand and use the counter to stand, but she could only stand for about 1.5 minutes before she had to sit back down.  Msk:     Significant tendernes over the thoracic to lumbosacral spine. Some paraspinal muscle tenderness as well. She has pain near her left flank area, her left hip, left buttock. She also has pain in the left posterior thigh. Pain on several areas down her left leg, including the popliteal region, the left knee, and left ankle. It was difficult to assess for crepitus due to patient's level of pain. She was able to stand, but used the table for balance.     Impression & Recommendations:  Problem # 1:  BACK PAIN (ICD-724.5) Assessment: Deteriorated Her pain has been present for at least 2 wks now, and is more intense compared to before. I gave her TORADOL  60 mg IM NOW. She states that there was substantial improvement with that. I also obtained x-rays of her T-S spine and  left hip. I looked at the results, but the official read was not back before I sent her home. For now, I did not see anything obvious so I told her to increase her  steroids. We will have her take 10 mg Prednisone  for 5 days then 5 mg for 6 days, then resume her 2.5 mg dose. Her hip pain makes AVN possible, but her pain certainly is not limited to her hip. She also has pain in her left ankle, left knee, thoracic to lumbosacral spine. She also has some pain in her upper extremities, but not nearly as intense. This polyarthralgia is consistent with her SICCA syndrome, and the pain that has responded quite well to prednisone  in the past. We will continue Flexeril , which she currently is not taking. She will continue Darvocet at the same dose. I encouraged her to take the medicines as prescribed which has been an issue in times past. I will have her return here in 1 week.  Her updated medication list for this problem includes:    Darvocet-n 100 100-650 Mg Tabs (Propoxyphene n-apap) .SABRA... Take one tablet every six hours as needed for pain    Cyclobenzaprine  Hcl 10 Mg Tabs (Cyclobenzaprine  hcl) .SABRA... At bedtime as needed    Aspirin  81 Mg Tbec (Aspirin ) .SABRA... Take 1 tablet by mouth once a day  Orders: Diagnostic X-Ray/Fluoroscopy (Diagnostic X-Ray/Flu) Admin of Therapeutic Inj  intramuscular or subcutaneous (03627) Ketorolac -Toradol  15mg  (G8114)   Problem # 2:  DYSFUNCTIONAL UTERINE BLEEDING (ICD-626.8) Assessment: Comment Only She has an appointment on the 20th of this month. Nothing more done at this time. I encouraged her to keep her appointment.   Complete Medication List: 1)  Darvocet-n 100 100-650 Mg Tabs (Propoxyphene n-apap) .... Take one tablet every six hours as needed for pain 2)  Hydrochlorothiazide  25 Mg Tabs (Hydrochlorothiazide ) .... Take 1 tablet by mouth once a day 3)  Levothyroxine  Sodium 50 Mcg Tabs (Levothyroxine  sodium) .... Take 1 tablet by mouth once a day 4)  Cyclobenzaprine  Hcl 10 Mg Tabs (Cyclobenzaprine  hcl) .... At bedtime as needed 5)  Oscal 500/200 D-3 Tabs (Calcium -vitamin d tabs) .... Take 1 tablet by mouth two times a day 6)   Aspirin  81 Mg Tbec (Aspirin ) .... Take 1 tablet by mouth once a day 7)  Metoprolol  Tartrate 25 Mg Tabs (Metoprolol  tartrate) .... Take one tablet two times a day 8)  Ferrous Sulfate  325 (65 Fe) Mg Tabs (Ferrous sulfate ) .... Take one tablet three times a day 9)  Medroxyprogesterone Acetate 5 Mg Tabs (Medroxyprogesterone acetate) .... Take 1 tablet by mouth once a day 10)  Guaifenesin  400 Mg Tabs (Guaifenesin ) .... Take one tablet every 6 hours as needed for cough. 11)  Allegra  180 Mg Tabs (Fexofenadine  hcl) .... Take 1 tablet by mouth once a day 12)  Prednisone  2.5 Mg Tabs (Prednisone ) .... Take 1 tablet by mouth once a day 13)  Prednisone  10 Mg Tabs (Prednisone ) .... Take one tablet daily for 5 days, then take 1/2 tablet 6 days. after 11 days, then resume 2.5 mg daily.   Patient Instructions: 1)  Please schedule a follow-up appointment in 1 week. 2)  Take your CYCLOBENZAPRINE  (FLEXERIL ) for muscle pain.  3)  Take PREDNISONE  as directed.  4)  Take DARVOCET if you continue to have pain.  5)  We will call if your x-ray is very abnormal.     Prescriptions: PREDNISONE  10 MG  TABS (PREDNISONE ) Take one tablet daily for 5 days, then take  1/2 tablet 6 days. After 11 days, then resume 2.5 mg daily.  #8 x 0   Entered and Authorized by:   Rolene Cleveland MD   Signed by:   Rolene Cleveland MD on 05/31/2007   Method used:   Print then Give to Patient   RxID:   [id number]  ]  Medication Administration  Injection # 1:    Medication: Ketorolac -Toradol  15mg     Diagnosis: BACK PAIN (ICD-724.5)    Route: IM    Site: R deltoid    Exp Date: 03/2008    Lot #: [id number]    Mfr: [facility]    Comments: 60mg  given; pain level #10    Patient tolerated injection without complications    Given by: Gaetana Pan RN (May 31, 2007 3:12 PM)  Orders Added: 1)  Est. Patient Level III [00786] 2)  Diagnostic X-Ray/Fluoroscopy [Diagnostic X-Ray/Flu] 3)  Admin of Therapeutic Inj  intramuscular or  subcutaneous [96372] 4)  Ketorolac -Toradol  15mg  [J1885]

## 2010-04-10 NOTE — Assessment & Plan Note (Signed)
 Summary: FU OV/EST/VS   Vital Signs:  Patient Profile:   55 Years Old Female Height:     63 inches (160.02 cm) Weight:      285.5 pounds (129.77 kg) BMI:     50.76 Temp:     97.7 degrees F (36.50 degrees C) oral Pulse rate:   80 / minute BP sitting:   138 / 88  (left arm)  Pt. in pain?   yes    Location:   right side hip/knee    Intensity:   5  Vitals Entered By: Shawnee Freddi LATHER LEODIS) (January 03, 2007 9:31 AM)              Is Patient Diabetic? No Nutritional Status BMI of > 30 = obese  Does patient need assistance? Functional Status Cook/clean Ambulation Impaired:Risk for fall, Wheelchair Comments wheelchair/cane     Chief Complaint:  routine f/u.  History of Present Illness: 55 yo woman with Primary Sjogren's syndrome with Sicca complex, obesity, HTN, chronic LBP, OSA, and abnormal uterine bleeding who a routine visit. Says she is feeling better overall. She does continue to have right knee and hip pain, but thinks that it is much improved from before.  She is having more uterine bleeding intermittently again. She took her last Provera pill about a week ago. She was taking a 5 mg daily trial period, and ran out about a week ago. She is having Headaches with the Provera. She is asking about fluid retention, because she says that she noticed some intermittent ankle swelling in the past month as well. Finally, she is asking about weight fluctuations on her new medicines which include the Provera already mentioned and her Prednisone  2.5 mg daily which was resumed last visit. She is taking her Plaquenil , but only once daily. She has not seen [facility] in some time now.   Dr. Nelwyn.Sherwood is her Theatre Manager.    Hypertension History:      She complains of headache and chest pain, but denies palpitations, dyspnea with exertion, orthopnea, and PND.  Further comments include: HA are more frequent. CP with cough/cold. SABRA        Positive major cardiovascular risk factors include  hypertension.  Negative major cardiovascular risk factors include female age less than 82 years old and non-tobacco-user status.      Prior Medications :  DARVOCET-N 100 100-650 MG TABS (PROPOXYPHENE N-APAP) Take one tablet every six hours as needed for pain HYDROCHLOROTHIAZIDE  25 MG TABS (HYDROCHLOROTHIAZIDE ) Take 1 tablet by mouth once a day LEVOTHYROXINE  SODIUM 50 MCG TABS (LEVOTHYROXINE  SODIUM) Take 1 tablet by mouth once a day PREDNISONE  2.5 MG TABS (PREDNISONE ) Take 1 tablet by mouth once a day CYCLOBENZAPRINE  HCL 10 MG TABS (CYCLOBENZAPRINE  HCL) at bedtime as needed OSCAL 500/200 D-3  TABS (CALCIUM -VITAMIN D TABS) Take 1 tablet by mouth two times a day ASPIRIN  81 MG TBEC (ASPIRIN ) Take 1 tablet by mouth once a day METOPROLOL  TARTRATE 25 MG TABS (METOPROLOL  TARTRATE) take one tablet two times a day FERROUS SULFATE  325 (65 FE) MG TABS (FERROUS SULFATE ) Take one tablet three times a day PLAQUENIL  200 MG TABS (HYDROXYCHLOROQUINE  SULFATE) Take 1 tablet by mouth two times a day MEDROXYPROGESTERONE ACETATE 5 MG  TABS (MEDROXYPROGESTERONE ACETATE) Take 1 tablet by mouth once a day    Current Allergies (reviewed today): ! * AMLODIPINE  ! * ALEVE ! * COCUNAX Updated/Current Medications (including changes made in today's visit):  DARVOCET-N 100 100-650 MG TABS (PROPOXYPHENE N-APAP) Take one tablet every six  hours as needed for pain HYDROCHLOROTHIAZIDE  25 MG TABS (HYDROCHLOROTHIAZIDE ) Take 1 tablet by mouth once a day LEVOTHYROXINE  SODIUM 50 MCG TABS (LEVOTHYROXINE  SODIUM) Take 1 tablet by mouth once a day PREDNISONE  2.5 MG TABS (PREDNISONE ) Take 1 tablet by mouth once a day CYCLOBENZAPRINE  HCL 10 MG TABS (CYCLOBENZAPRINE  HCL) at bedtime as needed OSCAL 500/200 D-3  TABS (CALCIUM -VITAMIN D TABS) Take 1 tablet by mouth two times a day ASPIRIN  81 MG TBEC (ASPIRIN ) Take 1 tablet by mouth once a day METOPROLOL  TARTRATE 25 MG TABS (METOPROLOL  TARTRATE) take one tablet two times a day FERROUS  SULFATE 325 (65 FE) MG TABS (FERROUS SULFATE ) Take one tablet three times a day PLAQUENIL  200 MG TABS (HYDROXYCHLOROQUINE  SULFATE) Take 1 tablet by mouth two times a day MEDROXYPROGESTERONE ACETATE 5 MG  TABS (MEDROXYPROGESTERONE ACETATE) Take 1 tablet by mouth once a day     Risk Factors: Tobacco use:  never Alcohol use:  no Exercise:  no Seatbelt use:  100 %  Mammogram History:    Date of Last Mammogram:  12/07/2006  PAP Smear History:    Date of Last PAP Smear:  11/18/2004    Physical Exam  General:     alert, well-nourished, and overweight-appearing. She is in good spirits, and does not appear to be in any distress today compared with last visit where she was tearful.  Extremities:     trace left pedal edema and trace right pedal edema.    No knee pain on palpation on the right.            Impression & Recommendations:  Problem # 1:  LUMP OR MASS IN BREAST (ICD-611.72) Assessment: Improved Her mammogram was normal. No further workup at this time. Nothing more for a year unless symptoms recur.   Problem # 2:  DYSFUNCTIONAL UTERINE BLEEDING (ICD-626.8) Assessment: Improved This is a big problem for her today. SHe is on Provera, but continues to have bleeding, although decreased. She is seeing GYN this Thursday. they may need to go up ot 10 mg daily on the medicine. She is having HA which are likely related to the Provera. It could be from BP elevation or from the medicine itself. BP is ok today. She is out of medicine for about a week. I did not refill. I will let GYN do that on Thursday if they so choose. She still does not want surgery for her uterine bleeding.   Problem # 3:  ANEMIA, IRON DEFICIENCY NOS (ICD-280.9) Assessment: Unchanged I encouraged her to use her Iron supplements. Check CBC/Ferritin today.  Her updated medication list for this problem includes:    Ferrous Sulfate  325 (65 Fe) Mg Tabs (Ferrous sulfate ) .SABRA... Take one tablet three times a  day  Orders: T-CBC w/Diff ([id number]) T-Ferritin ([id number])   Problem # 4:  HYPOTHYROIDISM (ICD-244.9) Assessment: Unchanged Check TSH also today.  Her updated medication list for this problem includes:    Levothyroxine  Sodium 50 Mcg Tabs (Levothyroxine  sodium) .SABRA... Take 1 tablet by mouth once a day  Labs Reviewed: TSH: 3.467 (07/29/2006)    Chol: 171 (07/29/2006)   HDL: 44 (07/29/2006)   LDL: 103 (07/29/2006)   TG: 122 (07/29/2006)  Orders: T-TSH ([id number])   Problem # 5:  HYPERTENSION (ICD-401.9) Assessment: Improved Much better today.  Her updated medication list for this problem includes:    Hydrochlorothiazide  25 Mg Tabs (Hydrochlorothiazide ) .SABRA... Take 1 tablet by mouth once a day    Metoprolol  Tartrate 25 Mg Tabs (Metoprolol  tartrate) .SABRASABRASABRASABRA  Take one tablet two times a day  BP today: 138/88 Prior BP: 167/98 (12/02/2006)  10 Yr Risk Heart Disease: 9 % Prior 10 Yr Risk Heart Disease: Not enough information (07/29/2006)  Labs Reviewed: Creat: 0.90 (07/29/2006) Chol: 171 (07/29/2006)   HDL: 44 (07/29/2006)   LDL: 103 (07/29/2006)   TG: 122 (07/29/2006)   Problem # 6:  MORBID OBESITY (ICD-278.01) Assessment: Unchanged She has Prednisone  and Provera that can increase weight. She is exercising more around the house with her pain under control. We talked about PT, she is not interested at this time.   Complete Medication List: 1)  Darvocet-n 100 100-650 Mg Tabs (Propoxyphene n-apap) .... Take one tablet every six hours as needed for pain 2)  Hydrochlorothiazide  25 Mg Tabs (Hydrochlorothiazide ) .... Take 1 tablet by mouth once a day 3)  Levothyroxine  Sodium 50 Mcg Tabs (Levothyroxine  sodium) .... Take 1 tablet by mouth once a day 4)  Prednisone  2.5 Mg Tabs (Prednisone ) .... Take 1 tablet by mouth once a day 5)  Cyclobenzaprine  Hcl 10 Mg Tabs (Cyclobenzaprine  hcl) .... At bedtime as needed 6)  Oscal 500/200 D-3 Tabs (Calcium -vitamin d tabs) .... Take 1 tablet by  mouth two times a day 7)  Aspirin  81 Mg Tbec (Aspirin ) .... Take 1 tablet by mouth once a day 8)  Metoprolol  Tartrate 25 Mg Tabs (Metoprolol  tartrate) .... Take one tablet two times a day 9)  Ferrous Sulfate  325 (65 Fe) Mg Tabs (Ferrous sulfate ) .... Take one tablet three times a day 10)  Plaquenil  200 Mg Tabs (Hydroxychloroquine  sulfate) .... Take 1 tablet by mouth two times a day 11)  Medroxyprogesterone Acetate 5 Mg Tabs (Medroxyprogesterone acetate) .... Take 1 tablet by mouth once a day  Hypertension Assessment/Plan:      The patient's hypertensive risk group is category A: No risk factors and no target organ damage.  Her calculated 10 year risk of coronary heart disease is 9 %.  Today's blood pressure is 138/88.     Patient Instructions: 1)  Please schedule a follow-up appointment in 3 months. If your labs are very abnormal, we will call you. Please have all records faxed to our office.     ]

## 2010-04-10 NOTE — Miscellaneous (Signed)
" °  Clinical Lists Changes  Orders: Added new Test order of MRI with & without Contrast (MRI w&w/o Contrast) - Signed  I will get MRI if positive for rotator cuf injury I will refer her to orthop. "

## 2010-04-10 NOTE — Miscellaneous (Signed)
Summary: [facility]: Letter Of Medical Necessity  [facility]: Letter Of Medical Necessity   Imported By: Florinda Marker 04/16/2009 14:00:05  _____________________________________________________________________  External Attachment:    Type:   Image     Comment:   External Document

## 2010-04-10 NOTE — Miscellaneous (Signed)
Summary: [facility]  [facility]   Imported By: Shon Hough 12/05/2009 14:39:47  _____________________________________________________________________  External Attachment:    Type:   Image     Comment:   External Document

## 2010-04-10 NOTE — Progress Notes (Signed)
Summary: refill/gg  Phone Note Refill Request  on June 18, 2009 11:22 AM  Refills Requested: Medication #1:  VICODIN 5-500 MG TABS Take 1 tablet by mouth every 4 hours as needed for pain   Dosage confirmed as above?Dosage Confirmed   Brand Name Necessary? No   Supply Requested: 1 month   Last Refilled: 04/03/2009  Method Requested: Fax to Local Pharmacy Initial call taken by: Merrie Roof RN,  June 18, 2009 11:23 AM  Follow-up for Phone Call         Refill approved-nurse to complete. Follow-up by: Lars Mage MD,  June 18, 2009 12:59 PM  Additional Follow-up for Phone Call Additional follow up Details #1::        Rx faxed to pharmacy Additional Follow-up by: Merrie Roof RN,  June 19, 2009 4:36 PM    Prescriptions: VICODIN 5-500 MG TABS (HYDROCODONE-ACETAMINOPHEN) Take 1 tablet by mouth every 4 hours as needed for pain  #50 x 0   Entered and Authorized by:   Lars Mage MD   Signed by:   Lars Mage MD on 06/18/2009   Method used:   Telephoned to ...       [facility] [address] [phone]* (retail)       [address]       [place], [place]  [address]       Ph: [phone]       Fax: [phone]   RxID:   [id number]

## 2010-04-10 NOTE — Assessment & Plan Note (Signed)
Summary: EST-2 WEEK RECHECK/CH   Vital Signs:  Patient profile:   55 year old female Height:      63 inches (160.02 cm) Weight:      261.4 pounds (118.82 kg) BMI:     46.47 Temp:     97.1 degrees F (36.17 degrees C) oral Pulse rate:   79 / minute BP sitting:   133 / 90  (right arm) Cuff size:   large  Vitals Entered By: Theotis Barrio NT II (September 13, 2009 4:18 PM) CC: FOLLOW UP ON COUGH   / LAB RESULTS Is Patient Diabetic? No Pain Assessment Patient in pain? yes     Location: abdomen Intensity:     7 Type: aching Onset of pain  WHEN SHE STARTED TAKING THE IRON Nutritional Status BMI of > 30 = obese  Have you ever been in a relationship where you felt threatened, hurt or afraid?No   Does patient need assistance? Functional Status Self care Ambulation Normal   Primary Care Provider:  Lars Mage MD  CC:  FOLLOW UP ON COUGH   / LAB RESULTS.  History of Present Illness: Ms Mudgett is well known to me as a patient from the past, she is here today for a 2 week follow up of her cough.  Cough is much better and she is not having any sputum anymore. I discussed ssteam inhalation with her which might benefit along with OTC cough suppresents.  She wanted to know her [place] results as she has vaginal spotting, I discussed her [place] results with her which are essentially normal. This was done as a part of her evaluation for anemia.  Her colonoscopy is due and she has scheduled it next week.  Other chonic [facility] is uptodate.  Preventive Screening-Counseling & Management  Alcohol-Tobacco     Alcohol drinks/day: 0     Smoking Status: never  Caffeine-Diet-Exercise     Does Patient Exercise: yes     Type of exercise: WALKING     Exercise (avg: min/session):     Times/week:   1-2  Problems Prior to Update: 1)  Cough  (ICD-786.2) 2)  Acute Cystitis  (ICD-595.0) 3)  Postnasal Drip  (ICD-784.91) 4)  Acute Pharyngitis  (ICD-462) 5)  Benign Paroxysmal Positional Vertigo   (ICD-386.11) 6)  Macular Degeneration, Bilateral  (ICD-362.50) 7)  Hemorrhoids  (ICD-455.6) 8)  Constipation, Drug Induced  (ICD-564.09) 9)  Shoulder Pain, Left  (ICD-719.41) 10)  Family History Breast Cancer 1st Degree Relative <50  (ICD-V16.3) 11)  Lump or Mass in Breast  (ICD-611.72) 12)  Hypertension  (ICD-401.9) 13)  Hypothyroidism  (ICD-244.9) 14)  Dysfunctional Uterine Bleeding  (ICD-626.8) 15)  Anemia, Iron Deficiency Nos  (ICD-280.9) 16)  Knee Pain, Chronic  (ICD-719.46) 17)  Hyperplasia, Endometrial Nos  (ICD-621.30) 18)  Hx of Health Screening  (ICD-V70.0) 19)  Sleep Apnea  (ICD-780.57) 20)  Morbid Obesity  (ICD-278.01) 21)  Sicca Syndrome  (ICD-710.2) 22)  Back Pain  (ICD-724.5)  Medications Prior to Update: 1)  Levothyroxine Sodium 50 Mcg Tabs (Levothyroxine Sodium) .... Take 1 Tablet By Mouth Once A Day 2)  Aspirin 81 Mg Tbec (Aspirin) .... Take 1 Tablet By Mouth Once A Day 3)  Ferrous Sulfate 325 (65 Fe) Mg Tabs (Ferrous Sulfate) .... Take One Tablet Three Times A Day 4)  Vicodin 5-500 Mg Tabs (Hydrocodone-Acetaminophen) .... Take 1 Tablet By Mouth Every 4 Hours As Needed For Pain 5)  Lisinopril-Hydrochlorothiazide 20-25 Mg Tabs (Lisinopril-Hydrochlorothiazide) .... Take 1  Tablet By Mouth Twice A Day 6)  Metoprolol Tartrate 25 Mg Tabs (Metoprolol Tartrate) .... Take 1 Tablet By Mouth Two Times A Day 7)  Amlodipine Besylate 10 Mg Tabs (Amlodipine Besylate) .... Take 1 Tablet By Mouth Once A Day 8)  Fluticasone Propionate 50 Mcg/act Susp (Fluticasone Propionate) .... 2 Sprays in Each Nostril Twice Daily 9)  Blood Pressure Monitor  Misc (Misc. Devices) 10)  Dex-Tuss 300-10 Mg/73ml Liqd (Guaifenesin-Codeine) .... Take 5 Ml Every 6 Hourly Until Your Cough Resolves.  Current Medications (verified): 1)  Levothyroxine Sodium 50 Mcg Tabs (Levothyroxine Sodium) .... Take 1 Tablet By Mouth Once A Day 2)  Aspirin 81 Mg Tbec (Aspirin) .... Take 1 Tablet By Mouth Once A Day 3)   Ferrous Sulfate 325 (65 Fe) Mg Tabs (Ferrous Sulfate) .... Take One Tablet Three Times A Day 4)  Vicodin 5-500 Mg Tabs (Hydrocodone-Acetaminophen) .... Take 1 Tablet By Mouth Every 4 Hours As Needed For Pain 5)  Lisinopril-Hydrochlorothiazide 20-25 Mg Tabs (Lisinopril-Hydrochlorothiazide) .... Take 1 Tablet By Mouth Twice A Day 6)  Metoprolol Tartrate 25 Mg Tabs (Metoprolol Tartrate) .... Take 1 Tablet By Mouth Two Times A Day 7)  Amlodipine Besylate 10 Mg Tabs (Amlodipine Besylate) .... Take 1 Tablet By Mouth Once A Day 8)  Fluticasone Propionate 50 Mcg/act Susp (Fluticasone Propionate) .... 2 Sprays in Each Nostril Twice Daily 9)  Blood Pressure Monitor  Misc (Misc. Devices) 10)  Dex-Tuss 300-10 Mg/106ml Liqd (Guaifenesin-Codeine) .... Take 5 Ml Every 6 Hourly Until Your Cough Resolves.  Allergies (verified): 1)  ! * Amlodipine 2)  ! * Aleve 3)  ! * Coconut  Past History:  Past Medical History: Last updated: 08/08/2007 Primary Sjogren's syndrome with Sicca complex -  anti-Ro +; ANA>1:1280 in homogenpattern -  negativ dsDNA/ RF/ antiSmith/RNP/C3-4 comp/ La/ Jo1/ Sceleroderma/ centromere -  neg HIV/ ACE/ Hep B/C -  nl CXR 2/05 - Schirmer salivary gland test not done -  Symptom Rx; eye drops/ prednisone/ plaquenil -  Referral to [facility] rheum, [name] 3/07. Saw Dr. Jimmy Footman, but stopped 2/2 costs Back pain-chronic low back L4-L5 discectomy; 11/99. Degenerative thoracic spondylotic changes 4/07 Hypertension -  no LVH EKG 7/05 - nl M/C ratio 4/07 Morbid Obesity- ht unknown Endometrial hyperplasia stripe > 63mm->7MM; GYN referral and BX  pending?  Obstructive sleep apnea-severe 09/2003 RD 161 per hr./cpap 18 cwp Hypothyroidism-09/1998 Microcytic anemia-09/1998. Hgb 10.5/ MCV 76. Needs Ferritin to determine ACD vs IDA Menorrhagia-10/1998 Uterine bleeding Lower extremity edema -  echo EF 55-65% w/o evidence of Dias Dysfx -  M/C ratio nl 4/07 -  Hgb 10.5 4/07, MCV  76 Polyarthalgias (knee/back/angle)w/ dx of fibromyalgia? given by [facility] rheum -  Knee pain, chronic secondary to obesity, fibromyalgia,  and Sjogren's as per above CP 10/07 -  neg adenosine myoview  Past Surgical History: Last updated: 03/17/2006 Tonsillectomy, age 89 (23)  Family History: Last updated: 05/12/2007 Mom- 60's from ovarian cancer. HTN. Dad- 60's from brain cancer.  Sister died from breast cancer.  Family History Ovarian cancer Family History Breast cancer 1st degree relative <50  Social History: Last updated: 01/17/2008 Her daughter [name]) often comes with her to visits. She also has a Nurse, children's (whose name is "Stark Bray" as well).   Risk Factors: Alcohol Use: 0 (09/13/2009) Exercise: yes (09/13/2009)  Risk Factors: Smoking Status: never (09/13/2009)  Review of Systems      See HPI  Physical Exam  Additional Exam:  Gen: AOx3, in no acute distress,  sitting in her electric scooter comfortably Eyes: PERRL, EOMI ENT:MMM, No erythema noted in posterior pharynx Neck: No JVD, No LAP Chest: CTAB with  good respiratory effort CVS: regular rhythmic rate, NO M/R/G, S1 S2 normal Abdo: soft,ND, BS+x4, Non tender and No hepatosplenomegaly EXT: No odema noted Neuro: Non focal, gait is normal Skin: no rashes noted.    Impression & Recommendations:  Problem # 1:  COUGH (ICD-786.2) Assessment Improved Significantly improved today.  Problem # 2:  SHOULDER PAIN, LEFT (ICD-719.41) Assessment: Improved Discussed shoulder exercises, use of moist heat or ice, and medication.  She is feeling much better over all and does not complain of pain. She has been trying to lose some weight as well and I encouraged her to keep on working on conservative measures.  Problem # 3:  HYPERTENSION (ICD-401.9) Assessment: Improved No new chnages today. Her updated medication list for this problem includes:    Lisinopril-hydrochlorothiazide 20-25 Mg Tabs  (Lisinopril-hydrochlorothiazide) .Marland Kitchen... Take 1 tablet by mouth twice a day    Metoprolol Tartrate 25 Mg Tabs (Metoprolol tartrate) .Marland Kitchen... Take 1 tablet by mouth two times a day    Amlodipine Besylate 10 Mg Tabs (Amlodipine besylate) .Marland Kitchen... Take 1 tablet by mouth once a day  BP today: 133/90 Prior BP: 172/97 (08/26/2009)  Prior 10 Yr Risk Heart Disease: 9 % (01/03/2007)  Labs Reviewed: K+: 4.0 (08/15/2009) Creat: : 0.78 (08/15/2009)   Chol: 171 (08/15/2009)   HDL: 41 (08/15/2009)   LDL: 111 (08/15/2009)   TG: 97 (08/15/2009)  Problem # 4:  ANEMIA, IRON DEFICIENCY NOS (ICD-280.9) Assessment: Unchanged Anemia panel including MMA is essentially noemal. I will continue iron repletion in her wait for colonoscopy results. Her other workup has been negative so far. Her Hbg is stable around 10 for last 2 years now. She has colonoscopy scheduled innext week. Her updated medication list for this problem includes:    Ferrous Sulfate 325 (65 Fe) Mg Tabs (Ferrous sulfate) .Marland Kitchen... Take one tablet three times a day  Hgb: 9.8 (08/15/2009)   Hct: 31.9 (08/15/2009)   Platelets: 246 (08/15/2009) RBC: 4.32 (08/15/2009)   RDW: 15.4 (08/15/2009)   WBC: 4.0 (08/15/2009) MCV: 73.8 (08/15/2009)   MCHC: 30.7 (08/15/2009) Ferritin: 80 (08/26/2009) Iron: 75 (08/26/2009)   TIBC: 252 (08/26/2009)   % Sat: 30 (08/26/2009) B12: 221 (08/26/2009)   Folate: 447 (08/27/2009)   TSH: 3.239 (05/14/2009)  Problem # 5:  Preventive Health Care (ICD-V70.0) Assessment: Comment Only Ttanus today. Pap report requested. Colonoscopy next week.  Complete Medication List: 1)  Levothyroxine Sodium 50 Mcg Tabs (Levothyroxine sodium) .... Take 1 tablet by mouth once a day 2)  Aspirin 81 Mg Tbec (Aspirin) .... Take 1 tablet by mouth once a day 3)  Ferrous Sulfate 325 (65 Fe) Mg Tabs (Ferrous sulfate) .... Take one tablet three times a day 4)  Lisinopril-hydrochlorothiazide 20-25 Mg Tabs (Lisinopril-hydrochlorothiazide) .... Take 1 tablet by  mouth twice a day 5)  Metoprolol Tartrate 25 Mg Tabs (Metoprolol tartrate) .... Take 1 tablet by mouth two times a day 6)  Amlodipine Besylate 10 Mg Tabs (Amlodipine besylate) .... Take 1 tablet by mouth once a day 7)  Blood Pressure Monitor Misc (Misc. devices)  Other Orders: Tdap => 32yrs IM (01027) Admin 1st Vaccine (25366)  Patient Instructions: 1)  Please schedule a follow-up appointment in 6 months. 2)  Please schedule a follow-up appointment as needed. 3)  Limit your Sodium (Salt). 4)  It is important that you exercise regularly at least 20 minutes  5 times a week. If you develop chest pain, have severe difficulty breathing, or feel very tired , stop exercising immediately and seek medical attention. 5)  You need to lose weight. Consider a lower calorie diet and regular exercise.  6)  Take an Aspirin every day. 7)  Check your blood sugars regularly. If your readings are usually above : or below 70 you should contact our office. 8)  It is important that your Diabetic A1c level is checked every 3 months. 9)  See your eye doctor yearly to check for diabetic eye damage. 10)  Check your feet each night for sore areas, calluses or signs of infection. 11)  Check your Blood Pressure regularly. If it is above: you should make an appointment.   Prevention & Chronic Care Immunizations   Influenza vaccine: Not documented   Influenza vaccine deferral: Deferred  (09/13/2009)    Tetanus booster: 09/13/2009: Tdap   Td booster deferral: Refused  (04/03/2009)    Pneumococcal vaccine: Not documented   Pneumococcal vaccine deferral: Not indicated  (01/17/2009)  Colorectal Screening   Hemoccult: Not documented   Hemoccult action/deferral: Not indicated  (01/17/2009)    Colonoscopy: Not documented   Colonoscopy action/deferral: GI referral  (09/13/2009)  Other Screening   Pap smear:  Specimen Adequacy: Satisfactory for evaluation.   Interpretation/Result:Negative for intraepithelial Lesion  or Malignancy.     (08/26/2006)   Pap smear action/deferral: Deferred  (09/13/2009)   Pap smear due: 09/2009    Mammogram: ASSESSMENT: Negative - BI-RADS 1^MM DIGITAL SCREENING  (05/30/2009)   Mammogram action/deferral: Deferred  (05/14/2009)   Mammogram due: 12/2007  Reports requested:   Last Pap report requested.  Smoking status: never  (09/13/2009)  Lipids   Total Cholesterol: 171  (08/15/2009)   LDL: 111  (08/15/2009)   LDL Direct: Not documented   HDL: 41  (08/15/2009)   Triglycerides: 97  (08/15/2009)  Hypertension   Last Blood Pressure: 133 / 90  (09/13/2009)   Serum creatinine: 0.78  (08/15/2009)   Serum potassium 4.0  (08/15/2009)    Hypertension flowsheet reviewed?: Yes   Progress toward BP goal: At goal  Self-Management Support :   Personal Goals (by the next clinic visit) :      Personal blood pressure goal: 140/90  (01/17/2009)   Patient will work on the following items until the next clinic visit to reach self-care goals:     Medications and monitoring: take my medicines every day, bring all of my medications to every visit  (09/13/2009)     Eating: eat more vegetables, use fresh or frozen vegetables, eat foods that are low in salt, eat baked foods instead of fried foods, eat fruit for snacks and desserts, limit or avoid alcohol  (09/13/2009)     Activity: take a 30 minute walk every day  (08/15/2009)    Hypertension self-management support: Resources for patients handout, Written self-care plan  (09/13/2009)   Hypertension self-care plan printed.    Self-management comments: PATIENT WALKS WHEN ABLE      Resource handout printed.   Nursing Instructions: Give tetanus booster today Request report of last Pap      Immunizations Administered:  Tetanus Vaccine:    Vaccine Type: Tdap    Site: left deltoid    Mfr: [employer]    Dose: 0.5 ml    Route: IM    Given by: Angelina Ok RN    Exp. Date: 05/31/2011    Lot #: [id number]    VIS  given:  01/25/07 version given September 13, 2009.

## 2010-04-10 NOTE — Assessment & Plan Note (Signed)
 Summary: CHECKUP/ SB.   Vital Signs:  Patient Profile:   55 Years Old Female Height:     63 inches (160.02 cm) Weight:      285.6 pounds (129.82 kg) BMI:     50.77 Temp:     98.4 degrees F (36.89 degrees C) oral Pulse rate:   81 / minute BP sitting:   169 / 100  (left arm) Cuff size:   LG / LEFT WRIST  Pt. in pain?   yes    Location:   lower back    Intensity:   10    Type:       sharp  Vitals Entered By: Ivin Holland NT II (August 08, 2007 3:17 PM)              Is Patient Diabetic? No Nutritional Status BMI of > 30 = obese  Have you ever been in a relationship where you felt threatened, hurt or afraid?No   Does patient need assistance? Functional Status Self care Ambulation Normal     Chief Complaint:  PAIN IN PAIN SINCE THIS PAST SATURDAY/ BP UP- PATIENT OUT OF ALL MEDICATIONS.  History of Present Illness: 55 yo woman with Primary Sjogren's syndrome with Sicca complex, obesity, HTN, chronic LBP, OSA, and abnormal uterine bleeding.  She is not taking any of her prednisone  at this time. She had plain films of her back in March which did not show any significant changes. She comes in with acute pain today. She says it has been present since 4 days prior to today when she moved wrong at home. She is not taking her muscle relaxant. She is tearful about the pain. Her daughter and her granddaughter accompany her today.     Prior Medications Reviewed Using: Patient Recall  Prior Medication List:  DARVOCET-N 100 100-650 MG TABS (PROPOXYPHENE N-APAP) Take one tablet every six hours as needed for pain HYDROCHLOROTHIAZIDE  25 MG TABS (HYDROCHLOROTHIAZIDE ) Take 1 tablet by mouth once a day LEVOTHYROXINE  SODIUM 50 MCG TABS (LEVOTHYROXINE  SODIUM) Take 1 tablet by mouth once a day CYCLOBENZAPRINE  HCL 10 MG TABS (CYCLOBENZAPRINE  HCL) at bedtime as needed OSCAL 500/200 D-3  TABS (CALCIUM -VITAMIN D TABS) Take 1 tablet by mouth two times a day ASPIRIN  81 MG TBEC (ASPIRIN ) Take 1  tablet by mouth once a day METOPROLOL  TARTRATE 25 MG TABS (METOPROLOL  TARTRATE) take one tablet two times a day FERROUS SULFATE  325 (65 FE) MG TABS (FERROUS SULFATE ) Take one tablet three times a day MEDROXYPROGESTERONE ACETATE 5 MG  TABS (MEDROXYPROGESTERONE ACETATE) Take 1 tablet by mouth once a day GUAIFENESIN  400 MG  TABS (GUAIFENESIN ) take one tablet every 6 hours as needed for cough. ALLEGRA  180 MG  TABS (FEXOFENADINE  HCL) Take 1 tablet by mouth once a day PREDNISONE  2.5 MG  TABS (PREDNISONE ) Take 1 tablet by mouth once a day PREDNISONE  10 MG  TABS (PREDNISONE ) Take one tablet daily for 5 days, then take 1/2 tablet 6 days. After 11 days, then resume 2.5 mg daily.   Updated Prior Medication List: DARVOCET-N 100 100-650 MG TABS (PROPOXYPHENE N-APAP) Take one tablet every six hours as needed for pain HYDROCHLOROTHIAZIDE  25 MG TABS (HYDROCHLOROTHIAZIDE ) Take 1 tablet by mouth once a day LEVOTHYROXINE  SODIUM 50 MCG TABS (LEVOTHYROXINE  SODIUM) Take 1 tablet by mouth once a day CYCLOBENZAPRINE  HCL 10 MG TABS (CYCLOBENZAPRINE  HCL) at bedtime as needed OSCAL 500/200 D-3  TABS (CALCIUM -VITAMIN D TABS) Take 1 tablet by mouth two times a day ASPIRIN  81 MG TBEC (ASPIRIN )  Take 1 tablet by mouth once a day METOPROLOL  TARTRATE 25 MG TABS (METOPROLOL  TARTRATE) take one tablet two times a day FERROUS SULFATE  325 (65 FE) MG TABS (FERROUS SULFATE ) Take one tablet three times a day MEDROXYPROGESTERONE ACETATE 5 MG  TABS (MEDROXYPROGESTERONE ACETATE) Take 1 tablet by mouth once a day GUAIFENESIN  400 MG  TABS (GUAIFENESIN ) take one tablet every 6 hours as needed for cough. ALLEGRA  180 MG  TABS (FEXOFENADINE  HCL) Take 1 tablet by mouth once a day PREDNISONE  2.5 MG  TABS (PREDNISONE ) Take 1 tablet by mouth once a day VOLTAREN  1 %  GEL (DICLOFENAC  SODIUM) Apply 2 grams four times daily to back as needed for pain  Current Allergies (reviewed today): ! * AMLODIPINE  ! * ALEVE ! * COCUNAX  Past Medical  History:    Reviewed history from 03/17/2006 and no changes required:       Primary Sjogren's syndrome with Sicca complex       -  anti-Ro +; ANA>1:1280 in homogenpattern       -  negativ dsDNA/ RF/ antiSmith/RNP/C3-4 comp/ La/ Jo1/ Sceleroderma/ centromere       -  neg HIV/ ACE/ Hep B/C       -  nl CXR 2/05       - Schirmer salivary gland test not done       -  Symptom Rx; eye drops/ prednisone / plaquenil        -  Referral to [facility] rheum, Dr. Norva Isaac 3/07. Saw Dr. Zieminski, but stopped 2/2 costs       Back pain-chronic low back L4-L5 discectomy; 11/99. Degenerative thoracic spondylotic changes 4/07       Hypertension       -  no LVH EKG 7/05       - nl M/C ratio 4/07       Morbid Obesity- ht unknown       Endometrial hyperplasia stripe > 75mm->7MM; GYN referral and BX  pending?        Obstructive sleep apnea-severe 09/2003 RD 161 per hr./cpap 18 cwp       Hypothyroidism-09/1998       Microcytic anemia-09/1998. Hgb 10.5/ MCV 76. Needs Ferritin to determine ACD vs IDA       Menorrhagia-10/1998       Uterine bleeding       Lower extremity edema       -  echo EF 55-65% w/o evidence of Dias Dysfx       -  M/C ratio nl 4/07       -  Hgb 10.5 4/07, MCV 76       Polyarthalgias (knee/back/angle)w/ dx of fibromyalgia? given by [facility]       -  Knee pain, chronic secondary to obesity, fibromyalgia,  and Sjogren's as per above       CP 10/07       -  neg adenosine myoview  Past Surgical History:    Reviewed history from 03/17/2006 and no changes required:       Tonsillectomy, age 69 (4)   Family History:    Reviewed history from 05/12/2007 and no changes required:       Mom- 60's from ovarian cancer. HTN.       Dad- 60's from brain cancer.        Sister died from breast cancer.        Family History Ovarian cancer       Family History Breast cancer 1st degree  relative <50  Social History:    Reviewed history from 05/31/2007 and no changes required:       Her daughter  often comes with her to visits. She also has a nurse, children's (whose name is Lynda as well).    Risk Factors: Tobacco use:  never Alcohol use:  no Exercise:  no Seatbelt use:  100 %  Mammogram History:    Date of Last Mammogram:  12/07/2006  PAP Smear History:    Date of Last PAP Smear:  11/18/2004    Physical Exam  General:     Tearful in moderate distress. Sitting in wheelchair and has difficulty standing without assistance. Msk:     Tenderness over the left hip, left buttock. Sciatica on the left.    Impression & Recommendations:  Problem # 1:  BACK PAIN (ICD-724.5) Assessment: Unchanged Secondary to Sjogren's Syndrome (SS) and Morbid obesity/ OA. X-rays in 3/09 revealed mild bilateral hips OA, mild DDD, and degenerative spondylitic changes. Toradol  60 mg IM now. Cont Darvocet, Cyclobenzaprine (not taking), and prescribe diclofenac  2 gm gel two times a day. Cont prednisone  2.5 mg daily for now for her SS. She stopped taking it altogether several weeks ago. When I saw her in March, I wanted her to return in 1 week, but she returned 3 months later. This time, I would like her to return in TWO weeks to re-assess her, determine if the medicines are working for her back, and decide if we need to continue the low dose prednisone . Of note, she does not have any inflammed joints. Arthralgias are common in SS, and this is likely combined with her morbid obesity. We tried hydroxychloroquine  in the past, but she stopped taking it after a short amount of time. She was also referred to [facility] for this, but stopped making her visits there. She has worked with PT with little effect. May be worth trying again. Steroids are only temporary, but have helped with pain syndromes in the past. She has not continued a regimen long enough to know if it will work completely. Return in 1 week.  Her updated medication list for this problem includes:    Darvocet-n 100 100-650 Mg  Tabs (Propoxyphene n-apap) .SABRA... Take one tablet every six hours as needed for pain    Cyclobenzaprine  Hcl 10 Mg Tabs (Cyclobenzaprine  hcl) .SABRA... At bedtime as needed    Aspirin  81 Mg Tbec (Aspirin ) .SABRA... Take 1 tablet by mouth once a day   Problem # 2:  HYPOTHYROIDISM (ICD-244.9) Assessment: Comment Only Her updated medication list for this problem includes:    Levothyroxine  Sodium 50 Mcg Tabs (Levothyroxine  sodium) .SABRA... Take 1 tablet by mouth once a day  Labs Reviewed: TSH: 2.982 (01/03/2007)    Chol: 161 (05/12/2007)   HDL: 49 (05/12/2007)   LDL: 90 (05/12/2007)   TG: 108 (05/12/2007)   Problem # 3:  HYPERTENSION (ICD-401.9) Assessment: Deteriorated Out of meds for a week. refills given.  Her updated medication list for this problem includes:    Hydrochlorothiazide  25 Mg Tabs (Hydrochlorothiazide ) .SABRA... Take 1 tablet by mouth once a day    Metoprolol  Tartrate 25 Mg Tabs (Metoprolol  tartrate) .SABRA... Take one tablet two times a day  BP today: 169/100 Prior BP: 130/78 (05/31/2007)  Prior 10 Yr Risk Heart Disease: 9 % (01/03/2007)  Labs Reviewed: Creat: 0.76 (05/12/2007) Chol: 161 (05/12/2007)   HDL: 49 (05/12/2007)   LDL: 90 (05/12/2007)   TG: 108 (05/12/2007)   Problem # 4:  MORBID  OBESITY (ICD-278.01) Assessment: Comment Only Down to 285.1 from 288 in March.   Complete Medication List: 1)  Darvocet-n 100 100-650 Mg Tabs (Propoxyphene n-apap) .... Take one tablet every six hours as needed for pain 2)  Hydrochlorothiazide  25 Mg Tabs (Hydrochlorothiazide ) .... Take 1 tablet by mouth once a day 3)  Levothyroxine  Sodium 50 Mcg Tabs (Levothyroxine  sodium) .... Take 1 tablet by mouth once a day 4)  Cyclobenzaprine  Hcl 10 Mg Tabs (Cyclobenzaprine  hcl) .... At bedtime as needed 5)  Oscal 500/200 D-3 Tabs (Calcium -vitamin d tabs) .... Take 1 tablet by mouth two times a day 6)  Aspirin  81 Mg Tbec (Aspirin ) .... Take 1 tablet by mouth once a day 7)  Metoprolol  Tartrate 25 Mg Tabs  (Metoprolol  tartrate) .... Take one tablet two times a day 8)  Ferrous Sulfate  325 (65 Fe) Mg Tabs (Ferrous sulfate ) .... Take one tablet three times a day 9)  Medroxyprogesterone Acetate 5 Mg Tabs (Medroxyprogesterone acetate) .... Take 1 tablet by mouth once a day 10)  Guaifenesin  400 Mg Tabs (Guaifenesin ) .... Take one tablet every 6 hours as needed for cough. 11)  Allegra  180 Mg Tabs (Fexofenadine  hcl) .... Take 1 tablet by mouth once a day 12)  Prednisone  2.5 Mg Tabs (Prednisone ) .... Take 1 tablet by mouth once a day 13)  Voltaren  1 % Gel (Diclofenac  sodium) .... Apply 2 grams four times daily to back as needed for pain   Patient Instructions: 1)  Take your Prednisone  2.5 mg daily. Use your Flexeril  (cyclobenzaprine ) at night. We have also added a gel to apply to your back twice daily. Please return for re-evaluation in 2 weeks.  2)  We will refill your blood pressure and thyroid  medicines.    Prescriptions: VOLTAREN  1 %  GEL (DICLOFENAC  SODIUM) Apply 2 grams four times daily to back as needed for pain  #1 mo supply x 0   Entered and Authorized by:   Rolene Cleveland MD   Signed by:   Rolene Cleveland MD on 08/10/2007   Method used:   Telephoned to ...         RxID:   [id number] PREDNISONE  2.5 MG  TABS (PREDNISONE ) Take 1 tablet by mouth once a day  #31 x 3   Entered and Authorized by:   Rolene Cleveland MD   Signed by:   Rolene Cleveland MD on 08/08/2007   Method used:   Print then Give to Patient   RxID:   [id number] METOPROLOL  TARTRATE 25 MG TABS (METOPROLOL  TARTRATE) take one tablet two times a day  #62 x 11   Entered and Authorized by:   Rolene Cleveland MD   Signed by:   Rolene Cleveland MD on 08/08/2007   Method used:   Print then Give to Patient   RxID:   [id number] CYCLOBENZAPRINE  HCL 10 MG TABS (CYCLOBENZAPRINE  HCL) at bedtime as needed  #31 x 1   Entered and Authorized by:   Rolene Cleveland MD   Signed by:   Rolene Cleveland MD on 08/08/2007   Method used:    Print then Give to Patient   RxID:   [id number] LEVOTHYROXINE  SODIUM 50 MCG TABS (LEVOTHYROXINE  SODIUM) Take 1 tablet by mouth once a day  #30 x 11   Entered and Authorized by:   Rolene Cleveland MD   Signed by:   Rolene Cleveland MD on 08/08/2007   Method used:   Print then Give to Patient   RxID:   [id number] HYDROCHLOROTHIAZIDE  25 MG TABS (  HYDROCHLOROTHIAZIDE ) Take 1 tablet by mouth once a day  #30 x 11   Entered and Authorized by:   Rolene Cleveland MD   Signed by:   Rolene Cleveland MD on 08/08/2007   Method used:   Print then Give to Patient   RxID:   [id number]  ]  Medication Administration  Injection # 1:    Medication: ketorolac  60 mg    Diagnosis: BACK PAIN (ICD-724.5)    Route: IM    Site: LUOQ gluteus    Exp Date: 03/09/2008    Lot #: [id number]    Mfr: [email or web address]    Patient tolerated injection without complications    Given by: Gayle Gambaccini RN (August 08, 2007 4:36 PM)  Orders Added: 1)  Est. Patient Level III [00786]

## 2010-04-10 NOTE — Assessment & Plan Note (Signed)
 Summary: CHECKUP/ SB.   Vital Signs:  Patient Profile:   55 Years Old Female Height:     63 inches (160.02 cm) Weight:      285.3 pounds (129.68 kg) BMI:     50.72 Temp:     97.4 degrees F (36.33 degrees C) oral Pulse rate:   69 / minute BP sitting:   167 / 98  (right arm)  Pt. in pain?   yes    Location:   all over    Intensity:   10    Type:       aching  Vitals Entered By: Gordy Sheen RN (December 02, 2006 11:11 AM)              Is Patient Diabetic? No Nutritional Status BMI of > 30 = obese  Have you ever been in a relationship where you felt threatened, hurt or afraid?No   Does patient need assistance? Functional Status Self care Ambulation Wheelchair     Chief Complaint:  pain all over the body .  History of Present Illness: 55 yo woman with Past Medical History: Primary Sjogren's syndrome with Sicca complex, obesity, HTN, chronic LBP, OSA, and abnormal uterine bleeding who is here for pain all over. She is out of Darvocet and Flexeril . She has not been back to see Dr. Romilda at [facility]. She has been to the clinic for Ob/Gyn. She was started on Provera 5 mg daily for a 3 mo trial (starting July 16).     Prior Medications :  DARVOCET-N 100 100-650 MG TABS (PROPOXYPHENE N-APAP) Take one tablet every six hours as needed for pain HYDROCHLOROTHIAZIDE  25 MG TABS (HYDROCHLOROTHIAZIDE ) Take 1 tablet by mouth once a day LEVOTHYROXINE  SODIUM 50 MCG TABS (LEVOTHYROXINE  SODIUM) Take 1 tablet by mouth once a day PREDNISONE  2.5 MG TABS (PREDNISONE ) Take 1 tablet by mouth once a day CYCLOBENZAPRINE  HCL 10 MG TABS (CYCLOBENZAPRINE  HCL) at bedtime as needed OSCAL 500/200 D-3  TABS (CALCIUM -VITAMIN D TABS) Take 1 tablet by mouth two times a day ASPIRIN  81 MG TBEC (ASPIRIN ) Take 1 tablet by mouth once a day METOPROLOL  TARTRATE 25 MG TABS (METOPROLOL  TARTRATE) take one tablet two times a day FERROUS SULFATE  325 (65 FE) MG TABS (FERROUS SULFATE ) Take one tablet three  times a day PLAQUENIL  200 MG TABS (HYDROXYCHLOROQUINE  SULFATE) Take 1 tablet by mouth two times a day    Current Allergies (reviewed today): ! * AMLODIPINE  ! * ALEVE ! * COCUNAX      Physical Exam  General:     Moderately distressed. Sitting in wheelchair. She was able to stand and walk to table with assistance.  Breasts:     no masses, no nipple discharge, no adenopathy, and R breast tender.   Msk:     Right foot pain on sole. Left foot pain on sole. Left and right ankle pain. Left and right shoulder pain. Mild elbow pain.  pain across knuckles R especially.  Pain at thoracic spine down to Sacral region. She also has paraspinal muscle TTP.   no joint swelling, no joint warmth, no redness over joints, and no joint deformities.  No trigger point tenderness, although her joint pain could be trigger point related. She is exquisitively tender when these areas are touched even barely.    Impression & Recommendations:  Problem # 1:  SICCA SYNDROME (ICD-710.2) Assessment: Deteriorated Her pain is quite intense on exam. Her joints look fine, without any redness, swelling, or warmth. However, they are  quite tender to touch. She remains on Plaquenil  and Prednisone  2.5 mg daily. I will increase prednisone  to 10 mg daily for 11 days, then 5 mg for 20 days. Then she will go back to 2.5 mg daily. I will refill her Darvocet and Flexeril . I instructed her to take the flexeril  at night before bedtime. I will have her return in 3-4 wks for followup. I encouraged her to keep her appt with Dr. Romilda.    Problem # 2:  DYSFUNCTIONAL UTERINE BLEEDING (ICD-626.8) Assessment: Unchanged She thinks that her pain is related to her menstrual cycle. I do not know the relationship. I encouraged her to call her gynecologist to talk to  them about the Provera that was started as a 3 mo trial.   Problem # 3:  HYPERTENSION (ICD-401.9) Assessment: Deteriorated I do not know if this is related to her pain  or not. Because she is always in some degree of pain, it is often hard to decipher. I did not make changes today, because I focused on her current pain. I will address in 3-4 wks once her pain is better controlled. I would add ACE inhibitor.  Her updated medication list for this problem includes:    Hydrochlorothiazide  25 Mg Tabs (Hydrochlorothiazide ) .SABRA... Take 1 tablet by mouth once a day    Metoprolol  Tartrate 25 Mg Tabs (Metoprolol  tartrate) .SABRA... Take one tablet two times a day  BP today: 167/98 Prior BP: 141/80 (07/29/2006)  Prior 10 Yr Risk Heart Disease: Not enough information (07/29/2006)  Labs Reviewed: Creat: 0.90 (07/29/2006) Chol: 171 (07/29/2006)   HDL: 44 (07/29/2006)   LDL: 103 (07/29/2006)   TG: 122 (07/29/2006)   Problem # 4:  BACK PAIN (ICD-724.5) Assessment: Deteriorated Refill medicines as per above.  Her updated medication list for this problem includes:    Darvocet-n 100 100-650 Mg Tabs (Propoxyphene n-apap) .SABRA... Take one tablet every six hours as needed for pain    Cyclobenzaprine  Hcl 10 Mg Tabs (Cyclobenzaprine  hcl) .SABRA... At bedtime as needed    Aspirin  81 Mg Tbec (Aspirin ) .SABRA... Take 1 tablet by mouth once a day   Problem # 5:  LUMP OR MASS IN BREAST (ICD-611.72) Assessment: New Mammogram to be ordered today. I did a breast exam and did not feel any palpable masses or axillary LAD. No nipple discharge.  Orders: Mammogram (Diagnostic) (Mammo)   Problem # 6:  Preventive Health Care (ICD-V70.0) Assessment: Comment Only Flu shot not given today because pt refused.  Complete Medication List: 1)  Darvocet-n 100 100-650 Mg Tabs (Propoxyphene n-apap) .... Take one tablet every six hours as needed for pain 2)  Hydrochlorothiazide  25 Mg Tabs (Hydrochlorothiazide ) .... Take 1 tablet by mouth once a day 3)  Levothyroxine  Sodium 50 Mcg Tabs (Levothyroxine  sodium) .... Take 1 tablet by mouth once a day 4)  Prednisone  2.5 Mg Tabs (Prednisone ) .... Take 1 tablet by  mouth once a day 5)  Cyclobenzaprine  Hcl 10 Mg Tabs (Cyclobenzaprine  hcl) .... At bedtime as needed 6)  Oscal 500/200 D-3 Tabs (Calcium -vitamin d tabs) .... Take 1 tablet by mouth two times a day 7)  Aspirin  81 Mg Tbec (Aspirin ) .... Take 1 tablet by mouth once a day 8)  Metoprolol  Tartrate 25 Mg Tabs (Metoprolol  tartrate) .... Take one tablet two times a day 9)  Ferrous Sulfate  325 (65 Fe) Mg Tabs (Ferrous sulfate ) .... Take one tablet three times a day 10)  Plaquenil  200 Mg Tabs (Hydroxychloroquine  sulfate) .... Take 1 tablet by  mouth two times a day 11)  Medroxyprogesterone Acetate 5 Mg Tabs (Medroxyprogesterone acetate) .... Take 1 tablet by mouth once a day   Patient Instructions: 1)  Please schedule a follow-up appointment in 1 month.  2)  Take the Prednisone  as follows: 3)  Take 10 mg tablet daily for 11 days.  4)  Then take 1/2 of a 10 mg tablet for 20 days. 5)  Then resume 2.5 mg daily. 6)  Take Flexeril  (Cyclobenzaprine ) each evening before bedtime. 7)  Take Darvocet when needed for additional pain control. 8)  I encourage you to keep your appointments with your: 9)  -Rheumatologist, Dr. Romilda at [facility]. 10)  -Gynecologist at [facility].  11)  Call both for appts in the next month.    Prescriptions: PLAQUENIL  200 MG TABS (HYDROXYCHLOROQUINE  SULFATE) Take 1 tablet by mouth two times a day  #62 x 5   Entered and Authorized by:   Rolene Cleveland MD   Signed by:   Rolene Cleveland MD on 12/02/2006   Method used:   Printed then faxed to ...         RxID:   [id number] METOPROLOL  TARTRATE 25 MG TABS (METOPROLOL  TARTRATE) take one tablet two times a day  #62 x 5   Entered and Authorized by:   Rolene Cleveland MD   Signed by:   Rolene Cleveland MD on 12/02/2006   Method used:   Printed then faxed to ...         RxID:   [id number] OSCAL 500/200 D-3  TABS (CALCIUM -VITAMIN D TABS) Take 1 tablet by mouth two times a day  #62 x 5   Entered and Authorized by:   Rolene Cleveland MD   Signed by:   Rolene Cleveland MD on 12/02/2006   Method used:   Printed then faxed to ...         RxID:   [id number] CYCLOBENZAPRINE  HCL 10 MG TABS (CYCLOBENZAPRINE  HCL) at bedtime as needed  #31 x 3   Entered and Authorized by:   Rolene Cleveland MD   Signed by:   Rolene Cleveland MD on 12/02/2006   Method used:   Printed then faxed to ...         RxID:   [id number] LEVOTHYROXINE  SODIUM 50 MCG TABS (LEVOTHYROXINE  SODIUM) Take 1 tablet by mouth once a day  #30 x 5   Entered and Authorized by:   Rolene Cleveland MD   Signed by:   Rolene Cleveland MD on 12/02/2006   Method used:   Printed then faxed to ...         RxID:   [id number] HYDROCHLOROTHIAZIDE  25 MG TABS (HYDROCHLOROTHIAZIDE ) Take 1 tablet by mouth once a day  #30 x 5   Entered and Authorized by:   Rolene Cleveland MD   Signed by:   Rolene Cleveland MD on 12/02/2006   Method used:   Printed then faxed to ...         RxID:   [id number] DARVOCET-N 100 100-650 MG TABS (PROPOXYPHENE N-APAP) Take one tablet every six hours as needed for pain  #0 x 0   Entered and Authorized by:   Rolene Cleveland MD   Signed by:   Rolene Cleveland MD on 12/02/2006   Method used:   Printed then faxed to ...         RxID:   [id number]  ]

## 2010-04-10 NOTE — Progress Notes (Signed)
Summary: wheelchair/ hla  Phone Note Other Incoming   Summary of Call: pam from [facility] states she needs a written script for power wheelchair, can you do this?  thanks, helen Initial call taken by: Marin Roberts RN,  December 06, 2009 10:12 AM  Follow-up for Phone Call        Yes I will write that prescription and drop it off in the prescription box in sample room today.  Thanks Follow-up by: Lars Mage MD,  December 09, 2009 12:01 PM

## 2010-04-10 NOTE — Assessment & Plan Note (Signed)
 Summary: RA/NEEDS CHECKUP AND PPK FILLED OUT/CH   Vital Signs:  Patient Profile:   55 Years Old Female Height:     63 inches (160.02 cm) Weight:      280.8 pounds (127.64 kg) BMI:     49.92 Temp:     98.0 degrees F (36.67 degrees C) oral Pulse rate:   64 / minute BP sitting:   129 / 81  (right arm)  Pt. in pain?   yes    Location:   left arm    Intensity:   8  Vitals Entered By: Shawnee Freddi LATHER LEODIS) (January 17, 2008 2:08 PM)              Is Patient Diabetic? No Nutritional Status BMI of > 30 = obese  Does patient need assistance? Functional Status Cook/clean, Shopping, Social activities Ambulation Wheelchair Comments arrived by w/c     PCP:  Inocente Lindsay MD  Chief Complaint:  f/u-needs paperwork completed for cpap and handicapp placecard.  History of Present Illness: This is my first continuity clinic visit with Nancy [name]. She is a 55 yo woman who was formerly followed by Dr Rollene. She is in today for follow up of her multiple medical problems.  1. Rotator cuff injury - Just had surgery on 01/04/08 by Dr Melita and is now undergoing PT. Having significant pain from this. Pt got injury after falling at home.  2. Sjogren's Syndrome - Has seen rheumatologist at [facility] as well as in [place] but stopped seeing them on her own. Also stopped meds (Plaquenil , Prednison) on her own.  3. HTN - Taking meds as directed. 4. Hypothyroidism - Taking synthroid  as directed. Euthyroid by TSH 8/09. 5.Chronic low back and bilateral knee pain due to severe OA - Taking Vicodin, Flexeril  with some relief 6. Anemia, microcytic - Still menstruating. Last period 9/09. Denies bloody stool. 7. Face-to-Face Mobility Examination for [employer] - Pt wants Hover[employer] and needs exam to justify this.       Prior Medications Reviewed Using: Medication Bottles  Prior Medication List:  VICODIN 5-500 MG TABS (HYDROCODONE -ACETAMINOPHEN ) take 1 tablet every 6 hour as needed. HYDROCHLOROTHIAZIDE   25 MG TABS (HYDROCHLOROTHIAZIDE ) Take 1 tablet by mouth once a day LEVOTHYROXINE  SODIUM 50 MCG TABS (LEVOTHYROXINE  SODIUM) Take 1 tablet by mouth once a day CYCLOBENZAPRINE  HCL 10 MG TABS (CYCLOBENZAPRINE  HCL) at bedtime as needed OSCAL 500/200 D-3  TABS (CALCIUM -VITAMIN D TABS) Take 1 tablet by mouth two times a day ASPIRIN  81 MG TBEC (ASPIRIN ) Take 1 tablet by mouth once a day METOPROLOL  TARTRATE 25 MG TABS (METOPROLOL  TARTRATE) take one tablet two times a day FERROUS SULFATE  325 (65 FE) MG TABS (FERROUS SULFATE ) Take one tablet three times a day PREDNISONE  2.5 MG  TABS (PREDNISONE ) Take 1 tablet by mouth once a day   Current Allergies: ! * AMLODIPINE  ! * ALEVE ! * COCUNAX   Social History:    Her daughter [name]) often comes with her to visits. She also has a nurse, children's (whose name is Lynda as well).    Risk Factors: Tobacco use:  never Alcohol use:  no Exercise:  no Seatbelt use:  100 %  Mammogram History:    Date of Last Mammogram:  12/07/2006  PAP Smear History:    Date of Last PAP Smear:  11/18/2004    Physical Exam  General:     overweight-appearing, uncomfortable-appearing, and mild distress.   Lungs:     normal respiratory effort and normal breath sounds.  Heart:     normal rate and regular rhythm.   Abdomen:     soft and non-tender.   Msk:     In wheelchair. L. shoulder in sling. Moves all other extremities but difficult due to  pain which is greatest in the  knees. Extremities:     1+ left pedal edema and 1+ right pedal edema.   Neurologic:     alert & oriented X3. Good grip on R. UE, markedly decreased grip on L. due to pain (s/p sgy and in sling) . Strength decreased in LE's due to pain. Unable to assess gait. Pt states too hard to get up due to weakness.  Psych:     Oriented X3 and subdued.      Impression & Recommendations:  Problem # 1:  SHOULDER PAIN, LEFT (ICD-719.41) s/p sgy for rotator cuff injury. Her  updated medication list for this problem includes:    Vicodin 5-500 Mg Tabs (Hydrocodone -acetaminophen ) .SABRA... Take 1 tablet every 6 hour as needed.    Cyclobenzaprine  Hcl 10 Mg Tabs (Cyclobenzaprine  hcl) .SABRA... At bedtime as needed    Aspirin  81 Mg Tbec (Aspirin ) .SABRA... Take 1 tablet by mouth once a day   Problem # 2:  KNEE PAIN, CHRONIC (ICD-719.46) Pain due to OA and worsened by morbid obesity. Uses cane to ambulate but unable to stand for very long due to pain and deconditioning. Note that pt's rotator cuff injury was due to a fall. Daughter with pt today and confirms pt's difficulty ambulating at home with cane. Daughter has to help her with ADL's such as bathing and dressing. Will send in forms for [employer].  Her updated medication list for this problem includes:    Vicodin 5-500 Mg Tabs (Hydrocodone -acetaminophen ) .SABRA... Take 1 tablet every 6 hour as needed.    Cyclobenzaprine  Hcl 10 Mg Tabs (Cyclobenzaprine  hcl) .SABRA... At bedtime as needed    Aspirin  81 Mg Tbec (Aspirin ) .SABRA... Take 1 tablet by mouth once a day   Problem # 3:  BACK PAIN (ICD-724.5) Chronic, due to degenerative disc disease. As above, will complete paper work for [employer].  Her updated medication list for this problem includes:    Vicodin 5-500 Mg Tabs (Hydrocodone -acetaminophen ) .SABRA... Take 1 tablet every 6 hour as needed.    Cyclobenzaprine  Hcl 10 Mg Tabs (Cyclobenzaprine  hcl) .SABRA... At bedtime as needed    Aspirin  81 Mg Tbec (Aspirin ) .SABRA... Take 1 tablet by mouth once a day   Problem # 4:  SICCA SYNDROME (ICD-710.2) Pt not interested in further rheumatologic follow up or medication at this time.  Problem # 5:  MORBID OBESITY (ICD-278.01) BMI  ~50. Discussed importance of weight loss with patient.   Problem # 6:  HYPERTENSION (ICD-401.9) Well-controlled on current meds. Continue.  Her updated medication list for this problem includes:    Hydrochlorothiazide  25 Mg Tabs (Hydrochlorothiazide ) .SABRA... Take 1 tablet by  mouth once a day    Metoprolol  Tartrate 25 Mg Tabs (Metoprolol  tartrate) .SABRA... Take one tablet two times a day  BP today: 129/81 Prior BP: 139/83 (11/02/2007)  Prior 10 Yr Risk Heart Disease: 9 % (01/03/2007)  Labs Reviewed: Creat: 0.76 (05/12/2007) Chol: 161 (05/12/2007)   HDL: 49 (05/12/2007)   LDL: 90 (05/12/2007)   TG: 108 (05/12/2007)   Problem # 7:  HYPOTHYROIDISM (ICD-244.9) Euthyroid on current dose of levothyroxine . Her updated medication list for this problem includes:    Levothyroxine  Sodium 50 Mcg Tabs (Levothyroxine  sodium) .SABRA... Take 1 tablet by mouth  once a day  Labs Reviewed: TSH: 2.719 (11/02/2007)    Chol: 161 (05/12/2007)   HDL: 49 (05/12/2007)   LDL: 90 (05/12/2007)   TG: 108 (05/12/2007)   Problem # 8:  ANEMIA, IRON DEFICIENCY NOS (ICD-280.9) Pt not taking iron. Encouraged her to start taking. Willl check Hgb as well as ferritin today. Anemia could be due to menses but will also need [name] CA screening.  Her updated medication list for this problem includes:    Ferrous Sulfate  325 (65 Fe) Mg Tabs (Ferrous sulfate ) .SABRA... Take one tablet three times a day  Orders: T-CBC No Diff ([id number]) T-Ferritin ([id number]) Hgb: 9.8 (05/12/2007)   Hct: 32.4 (05/12/2007)   RDW: 14.8 (05/12/2007)   MCV: 75.5 (05/12/2007)   MCHC: 30.2 (05/12/2007) Ferritin: 41 (01/03/2007) TSH: 2.719 (11/02/2007)   Problem # 9:  Preventive Health Care (ICD-V70.0) Mammo due. Scheduled. Needs PAP. Refused today. Lipid panel 3/09 - Total chol 161/ LDL 90/ HDL 49/ TG 108.  Complete Medication List: 1)  Vicodin 5-500 Mg Tabs (Hydrocodone -acetaminophen ) .... Take 1 tablet every 6 hour as needed. 2)  Hydrochlorothiazide  25 Mg Tabs (Hydrochlorothiazide ) .... Take 1 tablet by mouth once a day 3)  Levothyroxine  Sodium 50 Mcg Tabs (Levothyroxine  sodium) .... Take 1 tablet by mouth once a day 4)  Cyclobenzaprine  Hcl 10 Mg Tabs (Cyclobenzaprine  hcl) .... At bedtime as needed 5)  Oscal 500/200  D-3 Tabs (Calcium -vitamin d tabs) .... Take 1 tablet by mouth two times a day 6)  Aspirin  81 Mg Tbec (Aspirin ) .... Take 1 tablet by mouth once a day 7)  Metoprolol  Tartrate 25 Mg Tabs (Metoprolol  tartrate) .... Take one tablet two times a day 8)  Ferrous Sulfate  325 (65 Fe) Mg Tabs (Ferrous sulfate ) .... Take one tablet three times a day  Other Orders: Mammogram (Screening) (Mammo)   Patient Instructions: 1)  Please schedule a follow-up appointment in 2 months. 2)  We will call you with the results of your lab tests. 3)  Start taking your iron pill again.  4)  Send in the stool cards as directed.    ]

## 2010-04-10 NOTE — Progress Notes (Signed)
Summary: refill/ hla  Phone Note Refill Request Message from:  Patient on March 12, 2010 2:10 PM  Refills Requested: Medication #1:  HYDROCODONE-ACETAMINOPHEN 5-500 MG TABS .1/2 to 1 tab every 6 hours for pain..   Dosage confirmed as above?Dosage Confirmed   Brand Name Necessary? No   Supply Requested: 3 months   Last Refilled: 11/7 Initial call taken by: Marin Roberts RN,  March 12, 2010 2:10 PM  Follow-up for Phone Call        Refill approved-nurse to complete Follow-up by: Lars Mage MD,  March 13, 2010 4:27 PM  Additional Follow-up for Phone Call Additional follow up Details #1::        Rx called to pharmacy Additional Follow-up by: Marin Roberts RN,  March 18, 2010 3:48 PM    Prescriptions: HYDROCODONE-ACETAMINOPHEN 5-500 MG TABS (HYDROCODONE-ACETAMINOPHEN) .1/2 to 1 tab every 6 hours for pain.  #60 x 2   Entered and Authorized by:   Lars Mage MD   Signed by:   Lars Mage MD on 03/13/2010   Method used:   Telephoned to ...       [facility]  [address] [id number] (retail)       [address]       [place], [place]  [address]       Ph: [phone]       Fax: [phone]   RxID:   [id number]

## 2010-04-10 NOTE — Assessment & Plan Note (Signed)
Summary: 2WK F/U/EST/VS   Vital Signs:  Patient profile:   55 year old female Height:      63 inches (160.02 cm) Weight:      264.01 pounds (120.00 kg) BMI:     46.94 Temp:     97.4 degrees F (36.33 degrees C) oral Pulse rate:   68 / minute BP sitting:   126 / 68  (left arm)  Vitals Entered By: Angelina Ok RN (May 14, 2009 9:40 AM) Is Patient Diabetic? No Pain Assessment Patient in pain? yes     Location: left side Intensity: 5 Type: aching Onset of pain  Constant Nutritional Status of > 30 = obese  Have you ever been in a relationship where you felt threatened, hurt or afraid?No   Does patient need assistance? Functional Status Self care Ambulation Impaired:Risk for fall Comments Uses a wheel chair. Check up.  Had therapy yesterday.  Recheck of side pain.   Primary Care Provider:  Lars Mage MD   History of Present Illness: Patient is a 55 year old patient of mine with PMH as described in EMRmost notable for severe back pain and shoulder pain for last few years and recent aggravation. She is following with PT now and got her first sitting yesterday and feels better in terms of pain. She also had UTI and was prescribed antibiotics. She says that her left side is hurting today but it is not as bad as it was, no fever or chills but she still has burning while micturition. No other complaint at this time.   Depression History:      The patient denies a depressed mood most of the day and a diminished interest in her usual daily activities.         Problems Prior to Update: 1)  Acute Cystitis  (ICD-595.0) 2)  Postnasal Drip  (ICD-784.91) 3)  Acute Pharyngitis  (ICD-462) 4)  Benign Paroxysmal Positional Vertigo  (ICD-386.11) 5)  Macular Degeneration, Bilateral  (ICD-362.50) 6)  Hemorrhoids  (ICD-455.6) 7)  Constipation, Drug Induced  (ICD-564.09) 8)  Shoulder Pain, Left  (ICD-719.41) 9)  Family History Breast Cancer 1st Degree Relative <50  (ICD-V16.3) 10)  Lump  or Mass in Breast  (ICD-611.72) 11)  Hypertension  (ICD-401.9) 12)  Hypothyroidism  (ICD-244.9) 13)  Dysfunctional Uterine Bleeding  (ICD-626.8) 14)  Anemia, Iron Deficiency Nos  (ICD-280.9) 15)  Knee Pain, Chronic  (ICD-719.46) 16)  Hyperplasia, Endometrial Nos  (ICD-621.30) 17)  Hx of Health Screening  (ICD-V70.0) 18)  Sleep Apnea  (ICD-780.57) 19)  Morbid Obesity  (ICD-278.01) 20)  Sicca Syndrome  (ICD-710.2) 21)  Back Pain  (ICD-724.5)  Medications Prior to Update: 1)  Levothyroxine Sodium 50 Mcg Tabs (Levothyroxine Sodium) .... Take 1 Tablet By Mouth Once A Day 2)  Aspirin 81 Mg Tbec (Aspirin) .... Take 1 Tablet By Mouth Once A Day 3)  Ferrous Sulfate 325 (65 Fe) Mg Tabs (Ferrous Sulfate) .... Take One Tablet Three Times A Day 4)  Vicodin 5-500 Mg Tabs (Hydrocodone-Acetaminophen) .... Take 1 Tablet By Mouth Every 4 Hours As Needed For Pain 5)  Lisinopril-Hydrochlorothiazide 20-25 Mg Tabs (Lisinopril-Hydrochlorothiazide) .... Take 1 Tablet By Mouth Twice A Day 6)  Metoprolol Tartrate 25 Mg Tabs (Metoprolol Tartrate) .... Take 1 Tablet By Mouth Two Times A Day 7)  Amlodipine Besylate 10 Mg Tabs (Amlodipine Besylate) .... Take 1 Tablet By Mouth Once A Day 8)  Fluticasone Propionate 50 Mcg/act Susp (Fluticasone Propionate) .... 2 Sprays in Each Nostril  Twice Daily 9)  Blood Pressure Monitor  Misc (Misc. Devices) 10)  Ciprofloxacin Hcl 500 Mg Tabs (Ciprofloxacin Hcl) .... Take 1 Tablet By Mouth Two Times A Day  Current Medications (verified): 1)  Levothyroxine Sodium 50 Mcg Tabs (Levothyroxine Sodium) .... Take 1 Tablet By Mouth Once A Day 2)  Aspirin 81 Mg Tbec (Aspirin) .... Take 1 Tablet By Mouth Once A Day 3)  Ferrous Sulfate 325 (65 Fe) Mg Tabs (Ferrous Sulfate) .... Take One Tablet Three Times A Day 4)  Vicodin 5-500 Mg Tabs (Hydrocodone-Acetaminophen) .... Take 1 Tablet By Mouth Every 4 Hours As Needed For Pain 5)  Lisinopril-Hydrochlorothiazide 20-25 Mg Tabs  (Lisinopril-Hydrochlorothiazide) .... Take 1 Tablet By Mouth Twice A Day 6)  Metoprolol Tartrate 25 Mg Tabs (Metoprolol Tartrate) .... Take 1 Tablet By Mouth Two Times A Day 7)  Amlodipine Besylate 10 Mg Tabs (Amlodipine Besylate) .... Take 1 Tablet By Mouth Once A Day 8)  Fluticasone Propionate 50 Mcg/act Susp (Fluticasone Propionate) .... 2 Sprays in Each Nostril Twice Daily 9)  Blood Pressure Monitor  Misc (Misc. Devices) 10)  Ciprofloxacin Hcl 500 Mg Tabs (Ciprofloxacin Hcl) .... Take 1 Tablet By Mouth Two Times A Day  Allergies: 1)  ! * Amlodipine 2)  ! * Aleve 3)  ! * Coconut  Past History:  Past Medical History: Last updated: 08/08/2007 Primary Sjogren's syndrome with Sicca complex -  anti-Ro +; ANA>1:1280 in homogenpattern -  negativ dsDNA/ RF/ antiSmith/RNP/C3-4 comp/ La/ Jo1/ Sceleroderma/ centromere -  neg HIV/ ACE/ Hep B/C -  nl CXR 2/05 - Schirmer salivary gland test not done -  Symptom Rx; eye drops/ prednisone/ plaquenil -  Referral to [facility], [name] 3/07. Saw Dr. Jimmy Footman, but stopped 2/2 costs Back pain-chronic low back L4-L5 discectomy; 11/99. Degenerative thoracic spondylotic changes 4/07 Hypertension -  no LVH EKG 7/05 - nl M/C ratio 4/07 Morbid Obesity- ht unknown Endometrial hyperplasia stripe > 12mm->7MM; GYN referral and BX  pending?  Obstructive sleep apnea-severe 09/2003 RD 161 per hr./cpap 18 cwp Hypothyroidism-09/1998 Microcytic anemia-09/1998. Hgb 10.5/ MCV 76. Needs Ferritin to determine ACD vs IDA Menorrhagia-10/1998 Uterine bleeding Lower extremity edema -  echo EF 55-65% w/o evidence of Dias Dysfx -  M/C ratio nl 4/07 -  Hgb 10.5 4/07, MCV 76 Polyarthalgias (knee/back/angle)w/ dx of fibromyalgia? given by [facility] rheum -  Knee pain, chronic secondary to obesity, fibromyalgia,  and Sjogren's as per above CP 10/07 -  neg adenosine myoview  Past Surgical History: Last updated: 03/17/2006 Tonsillectomy, age 41 (50)  Family  History: Last updated: 05/12/2007 Mom- 60's from ovarian cancer. HTN. Dad- 60's from brain cancer.  Sister died from breast cancer.  Family History Ovarian cancer Family History Breast cancer 1st degree relative <50  Social History: Last updated: 01/17/2008 Her daughter [name]) often comes with her to visits. She also has a Nurse, children's (whose name is "Stark Bray" as well).   Risk Factors: Alcohol Use: 0 (04/29/2009) Exercise: yes (04/29/2009)  Risk Factors: Smoking Status: never (04/29/2009)  Review of Systems      See HPI  Physical Exam  Additional Exam:  Gen: AOx3, in no acute distress, in wheel chair Eyes: PERRL, EOMI ENT:MMM, No erythema noted in posterior pharynx Neck: No JVD, No LAP Chest: CTAB with  good respiratory effort CVS: regular rhythmic rate, NO M/R/G, S1 S2 normal Abdo: soft,ND, BS+x4, Non tender and No hepatosplenomegaly EXT: No odema noted Neuro: Non focal, gait is normal Skin: no rashes noted.  Impression & Recommendations:  Problem # 1:  ACUTE CYSTITIS (ICD-595.0) Assessment Improved  Since she is still having symptoms c/w acxute cystitis. We will check a UA to establish any residual signs of inflammation. The patient had UA negative culture positive UTI the last time around and Dr Aundria Rud is not conviced that it was an infection since UA was negative. Any ways we are going to follow up on UA and if positive will start antibiotics. Her updated medication list for this problem includes:    Ciprofloxacin Hcl 500 Mg Tabs (Ciprofloxacin hcl) .Marland Kitchen... Take 1 tablet by mouth two times a day  Orders: T-Urinalysis ([id number])  Problem # 2:  HYPERTENSION (ICD-401.9) Assessment: Improved Well controlled at this time. Her updated medication list for this problem includes:    Lisinopril-hydrochlorothiazide 20-25 Mg Tabs (Lisinopril-hydrochlorothiazide) .Marland Kitchen... Take 1 tablet by mouth twice a day    Metoprolol Tartrate 25 Mg Tabs  (Metoprolol tartrate) .Marland Kitchen... Take 1 tablet by mouth two times a day    Amlodipine Besylate 10 Mg Tabs (Amlodipine besylate) .Marland Kitchen... Take 1 tablet by mouth once a day  BP today: 126/68 Prior BP: 159/97 (04/29/2009)  Prior 10 Yr Risk Heart Disease: 9 % (01/03/2007)  Labs Reviewed: K+: 4.1 (12/06/2008) Creat: : 0.80 (12/06/2008)   Chol: 173 (12/06/2008)   HDL: 45 (12/06/2008)   LDL: 107 (12/06/2008)   TG: 106 (12/06/2008)  Problem # 3:  HYPOTHYROIDISM (ICD-244.9) Assessment: Comment Only Since it has been a year that we checked TSH. I will reorder and follow it up. Her updated medication list for this problem includes:    Levothyroxine Sodium 50 Mcg Tabs (Levothyroxine sodium) .Marland Kitchen... Take 1 tablet by mouth once a day  Orders: T-TSH ([id number])  Labs Reviewed: TSH: 3.699 (05/22/2008)    Chol: 173 (12/06/2008)   HDL: 45 (12/06/2008)   LDL: 107 (12/06/2008)   TG: 106 (12/06/2008)  Problem # 4:  MORBID OBESITY (ICD-278.01) Assessment: Comment Only She lost 2 pounds since last visit. I discussed Lupita Leash Riley's lifestyle mod classes but she was not interested at this visit. Ht: 63 (05/14/2009)   Wt: 264.01 (05/14/2009)   BMI: 46.94 (05/14/2009)  Problem # 5:  BACK PAIN (ICD-724.5) Following up with PT now and well controlled at this point of time. Her updated medication list for this problem includes:    Aspirin 81 Mg Tbec (Aspirin) .Marland Kitchen... Take 1 tablet by mouth once a day    Vicodin 5-500 Mg Tabs (Hydrocodone-acetaminophen) .Marland Kitchen... Take 1 tablet by mouth every 4 hours as needed for pain  Discussed use of moist heat or ice, modified activities, medications, and stretching/strengthening exercises. Back care instructions given. To be seen in 2 weeks if no improvement; sooner if worsening of symptoms.   Problem # 6:  Preventive Health Care (ICD-V70.0) Discussed but not really interested in flu shot at this time. Gyn referrel pending and denies colonoscopy.  Complete Medication List: 1)   Levothyroxine Sodium 50 Mcg Tabs (Levothyroxine sodium) .... Take 1 tablet by mouth once a day 2)  Aspirin 81 Mg Tbec (Aspirin) .... Take 1 tablet by mouth once a day 3)  Ferrous Sulfate 325 (65 Fe) Mg Tabs (Ferrous sulfate) .... Take one tablet three times a day 4)  Vicodin 5-500 Mg Tabs (Hydrocodone-acetaminophen) .... Take 1 tablet by mouth every 4 hours as needed for pain 5)  Lisinopril-hydrochlorothiazide 20-25 Mg Tabs (Lisinopril-hydrochlorothiazide) .... Take 1 tablet by mouth twice a day 6)  Metoprolol Tartrate 25 Mg Tabs (Metoprolol tartrate) .... Take 1  tablet by mouth two times a day 7)  Amlodipine Besylate 10 Mg Tabs (Amlodipine besylate) .... Take 1 tablet by mouth once a day 8)  Fluticasone Propionate 50 Mcg/act Susp (Fluticasone propionate) .... 2 sprays in each nostril twice daily 9)  Blood Pressure Monitor Misc (Misc. devices) 10)  Ciprofloxacin Hcl 500 Mg Tabs (Ciprofloxacin hcl) .... Take 1 tablet by mouth two times a day  Patient Instructions: 1)  Please schedule a follow-up appointment as needed. 2)  You need to lose weight. Consider a lower calorie diet and regular exercise.  3)  Check your Blood Pressure regularly. If it is above: 140/90 you should make an appointment.  Prevention & Chronic Care Immunizations   Influenza vaccine: Not documented   Influenza vaccine deferral: Deferred  (04/29/2009)    Tetanus booster: 04/29/2009: Td   Td booster deferral: Refused  (04/03/2009)    Pneumococcal vaccine: Not documented   Pneumococcal vaccine deferral: Not indicated  (01/17/2009)  Colorectal Screening   Hemoccult: Not documented   Hemoccult action/deferral: Not indicated  (01/17/2009)    Colonoscopy: Not documented   Colonoscopy action/deferral: Refused  (04/29/2009)  Other Screening   Pap smear:  Specimen Adequacy: Satisfactory for evaluation.   Interpretation/Result:Negative for intraepithelial Lesion or Malignancy.     (08/26/2006)   Pap smear  action/deferral: Deferred  (05/14/2009)   Pap smear due: 09/2009    Mammogram: Assessment: BIRADS 1.  The appearance of both breasts is similar to the prior mammogram. Scattered benign-appearing calcifications in both  breasts.  No evidence of mass, suspicious microcalcification or architectural distortion in either breast.   IMPRESSION:   No evidence of malignancy in either breast.   THIS PROCEDURE WAS A DIGITAL  MAMMOGRAM   Read By:  Oliver Hum,  M.D.     Released By:  Oliver Hum,  M.D.  (12/07/2006)   Mammogram action/deferral: Deferred  (05/14/2009)   Mammogram due: 12/2007   Smoking status: never  (04/29/2009)  Lipids   Total Cholesterol: 173  (12/06/2008)   LDL: 107  (12/06/2008)   LDL Direct: Not documented   HDL: 45  (12/06/2008)   Triglycerides: 106  (12/06/2008)  Hypertension   Last Blood Pressure: 126 / 68  (05/14/2009)   Serum creatinine: 0.80  (12/06/2008)   Serum potassium 4.1  (12/06/2008)    Hypertension flowsheet reviewed?: Yes   Progress toward BP goal: At goal  Self-Management Support :   Personal Goals (by the next clinic visit) :      Personal blood pressure goal: 140/90  (01/17/2009)   Patient will work on the following items until the next clinic visit to reach self-care goals:     Medications and monitoring: take my medicines every day, bring all of my medications to every visit  (05/14/2009)     Eating: drink diet soda or water instead of juice or soda, eat more vegetables, use fresh or frozen vegetables, eat foods that are low in salt, eat baked foods instead of fried foods, eat fruit for snacks and desserts  (05/14/2009)     Activity: take a 30 minute walk every day  (05/14/2009)    Hypertension self-management support: Education handout, Written self-care plan  (05/14/2009)   Hypertension self-care plan printed.   Hypertension education handout printed  Process Orders Check Orders Results:     [facility]:  Check successful Tests Sent for requisitioning (May 14, 2009 12:36 PM):     05/14/2009: [facility] -- T-TSH [id number] (signed)  05/14/2009: [facility] -- T-Urinalysis [[id number]] (signed)     Vital Signs:  Patient profile:   55 year old female Height:      63 inches (160.02 cm) Weight:      264.01 pounds (120.00 kg) BMI:     46.94 Temp:     97.4 degrees F (36.33 degrees C) oral Pulse rate:   68 / minute BP sitting:   126 / 68  (left arm)  Vitals Entered By: Angelina Ok RN (May 14, 2009 9:40 AM)

## 2010-04-14 ENCOUNTER — Encounter: Payer: Self-pay | Admitting: Internal Medicine

## 2010-04-30 ENCOUNTER — Other Ambulatory Visit: Payer: Self-pay | Admitting: *Deleted

## 2010-05-05 MED ORDER — LISINOPRIL-HYDROCHLOROTHIAZIDE 20-25 MG PO TABS: 1.0000 | ORAL_TABLET | Freq: Every day | ORAL | Status: DC

## 2010-05-30 ENCOUNTER — Encounter: Payer: Self-pay | Admitting: Internal Medicine

## 2010-06-16 ENCOUNTER — Encounter: Payer: Self-pay | Admitting: Internal Medicine

## 2010-06-16 ENCOUNTER — Ambulatory Visit (INDEPENDENT_AMBULATORY_CARE_PROVIDER_SITE_OTHER): Payer: BC Managed Care – PPO | Admitting: Internal Medicine

## 2010-06-16 DIAGNOSIS — I10 Essential (primary) hypertension: Secondary | ICD-10-CM

## 2010-06-16 DIAGNOSIS — E039 Hypothyroidism, unspecified: Secondary | ICD-10-CM

## 2010-06-16 DIAGNOSIS — M25569 Pain in unspecified knee: Secondary | ICD-10-CM

## 2010-06-16 DIAGNOSIS — E785 Hyperlipidemia, unspecified: Secondary | ICD-10-CM

## 2010-06-16 DIAGNOSIS — D509 Iron deficiency anemia, unspecified: Secondary | ICD-10-CM

## 2010-06-16 LAB — LIPID PANEL
Cholesterol: 160 mg/dL (ref 0–200)
HDL: 48 mg/dL
LDL Cholesterol: 96 mg/dL (ref 0–99)
Total CHOL/HDL Ratio: 3.3 ratio
Triglycerides: 79 mg/dL
VLDL: 16 mg/dL (ref 0–40)

## 2010-06-16 LAB — TSH: TSH: 1.171 u[IU]/mL (ref 0.350–4.500)

## 2010-06-16 MED ORDER — METOPROLOL TARTRATE 25 MG PO TABS: 25.0000 mg | ORAL_TABLET | Freq: Two times a day (BID) | ORAL | Status: DC

## 2010-06-16 MED ORDER — FEXOFENADINE HCL 30 MG PO TABS: 30.0000 mg | ORAL_TABLET | Freq: Two times a day (BID) | ORAL | Status: DC

## 2010-06-16 NOTE — Patient Instructions (Signed)

## 2010-06-17 LAB — COMPLETE METABOLIC PANEL WITHOUT GFR
ALT: 8 U/L (ref 0–35)
AST: 14 U/L (ref 0–37)
Albumin: 3.8 g/dL (ref 3.5–5.2)
Alkaline Phosphatase: 52 U/L (ref 39–117)
BUN: 14 mg/dL (ref 6–23)
CO2: 31 meq/L (ref 19–32)
Calcium: 8.9 mg/dL (ref 8.4–10.5)
Chloride: 99 meq/L (ref 96–112)
Creat: 0.85 mg/dL (ref 0.40–1.20)
GFR, Est African American: >60 mL/min
GFR, Est Non African American: >60 mL/min
Glucose, Bld: 96 mg/dL (ref 70–99)
Potassium: 3.9 meq/L (ref 3.5–5.3)
Sodium: 135 meq/L (ref 135–145)
Total Bilirubin: 0.5 mg/dL (ref 0.3–1.2)
Total Protein: 7.4 g/dL (ref 6.0–8.3)

## 2010-06-17 LAB — CBC WITH DIFFERENTIAL/PLATELET
Basophils Absolute: 0 10*3/uL (ref 0.0–0.1)
Basophils Relative: 1 % (ref 0–1)
Eosinophils Absolute: 0.1 10*3/uL (ref 0.0–0.7)
Eosinophils Relative: 3 % (ref 0–5)
HCT: 34.1 % — ABNORMAL LOW (ref 36.0–46.0)
Hemoglobin: 10.4 g/dL — ABNORMAL LOW (ref 12.0–15.0)
Lymphocytes Relative: 49 % — ABNORMAL HIGH (ref 12–46)
Lymphs Abs: 2.1 10*3/uL (ref 0.7–4.0)
MCH: 22.8 pg — ABNORMAL LOW (ref 26.0–34.0)
MCHC: 30.5 g/dL (ref 30.0–36.0)
MCV: 74.8 fL — ABNORMAL LOW (ref 78.0–100.0)
Monocytes Absolute: 0.5 10*3/uL (ref 0.1–1.0)
Monocytes Relative: 12 % (ref 3–12)
Neutro Abs: 1.5 10*3/uL — ABNORMAL LOW (ref 1.7–7.7)
Neutrophils Relative %: 35 % — ABNORMAL LOW (ref 43–77)
Platelets: 149 10*3/uL — ABNORMAL LOW (ref 150–400)
RBC: 4.56 MIL/uL (ref 3.87–5.11)
RDW: 14.7 % (ref 11.5–15.5)
WBC: 4.3 10*3/uL (ref 4.0–10.5)

## 2010-06-17 NOTE — Assessment & Plan Note (Signed)
Would check TSH today.

## 2010-06-17 NOTE — Assessment & Plan Note (Signed)
I will check CBC today. 

## 2010-06-17 NOTE — Assessment & Plan Note (Signed)
Change blood pressure was elevated today needed patient has not been taking her metoprolol as prescribed. I would restart metoprolol. Called patient back in 14 days to recheck her blood pressure. Would also check potassium and creatinine today.

## 2010-06-17 NOTE — Progress Notes (Signed)
  Subjective:    Patient ID: Nancy [name], female    DOB: 21-Jan-1956, 55 y.o.   MRN: [id number]  HPI  Nancy [name] is a 55 yo patient of mine who is here today for a regular follow up.  She complains of frontal headaches which are new and started about 1 month ago, she has itching in her eyes and nostrils and also complains of sneezing a lot lately. The pain is throughout the day, not a/w aura. There are no exacerbating or relieving factors, the pain is described as throbbing, present throughout the day and 7-8/10 at its worse. She has not tried any meds so far. She does mention that it is worse in mornings.  BP is elevated today. She has not been taking her metoprolol as prescribed.  Patient has gained about 10 pounds since her last office visit. I stressed upon him the importance of losing weight today.  Patient does not complain of any new bleeding from her hemorrhoids does not report any new meds of fatigue. Patient states that she has stopped taking her iron pills.  The patient is hypothyroidism and is due for a TSH check.  No other complaints at this time.    Review of Systems  Constitutional: Negative for fever, activity change and appetite change.  HENT: Positive for congestion and sneezing. Negative for sore throat.   Eyes: Positive for redness and itching. Negative for pain.  Respiratory: Negative for cough and shortness of breath.   Cardiovascular: Negative for chest pain and leg swelling.  Gastrointestinal: Negative for nausea, abdominal pain, diarrhea, constipation and abdominal distention.  Genitourinary: Negative for frequency, hematuria and difficulty urinating.  Neurological: Negative for dizziness and headaches.  Psychiatric/Behavioral: Negative for suicidal ideas and behavioral problems.       Objective:   Physical Exam  Constitutional: She is oriented to person, place, and time. She appears well-developed and well-nourished.  HENT:  Head: Normocephalic and  atraumatic.       Sinus tenderness + right maxillary,ethmoid and frontal TTP> left  Eyes: Conjunctivae and EOM are normal. Pupils are equal, round, and reactive to light. No scleral icterus.  Neck: Normal range of motion. Neck supple. No JVD present. No thyromegaly present.  Cardiovascular: Normal rate, regular rhythm, normal heart sounds and intact distal pulses.  Exam reveals no gallop and no friction rub.   No murmur heard. Pulmonary/Chest: Effort normal and breath sounds normal. No respiratory distress. She has no wheezes. She has no rales.  Abdominal: Soft. Bowel sounds are normal. She exhibits no distension and no mass. There is no tenderness. There is no rebound and no guarding.  Musculoskeletal: Normal range of motion. She exhibits no edema and no tenderness.  Lymphadenopathy:    She has no cervical adenopathy.  Neurological: She is alert and oriented to person, place, and time.  Psychiatric: She has a normal mood and affect. Her behavior is normal.          Assessment & Plan:

## 2010-06-17 NOTE — Assessment & Plan Note (Signed)
Knee pain is about the same. Patient says that Vicodin is the only thing that helps. Again mentioned the importance of losing weight.

## 2010-06-17 NOTE — Assessment & Plan Note (Signed)
Patient was counseled extensively about her need to lose weight. Patient's daughter was counseled along with Ms Mccamy regarding the benefits of losing weight.

## 2010-07-03 ENCOUNTER — Ambulatory Visit (INDEPENDENT_AMBULATORY_CARE_PROVIDER_SITE_OTHER): Payer: BC Managed Care – PPO | Admitting: Internal Medicine

## 2010-07-03 ENCOUNTER — Encounter: Payer: Self-pay | Admitting: Internal Medicine

## 2010-07-03 DIAGNOSIS — I10 Essential (primary) hypertension: Secondary | ICD-10-CM

## 2010-07-03 NOTE — Patient Instructions (Addendum)
1800 Calorie Diet The 1800 calorie diabetic diet is designed for eating up to 1800 calories each day. Following this diet and making healthy meal choices can help improve overall health. It controls blood sugar levels, and it can also help lower blood pressure and cholesterol. SERVING SIZES Measuring foods and serving sizes helps to make sure you are getting the right amount of food. The list below tells how big or small some common serving sizes are:  1 ounce (oz)...................................4 stacked dice.   3 oz...............................................Marland KitchenDeck of cards.   1 teaspoon (tsp)............................Marland KitchenTip of little finger.   1 tablespoon (Tbsp).....................Marland KitchenMarland KitchenThumb.   2 Tbsp..........................................Marland KitchenGolf ball.   1/2 cup.........................................Marland KitchenHalf of a fist.   1 cup............................................Marland KitchenA fist.  GUIDELINES FOR CHOOSING FOODS The goal of this diet is to eat a variety of foods and limit calories to 1800 each day. This can be done by choosing foods that are low in calories and fat. The diet also suggests eating small amounts of food frequently. Doing this helps control your blood sugar levels so they do not get too high or too low. Each meal or snack may include a protein food source to help you feel more satisfied. Try to eat about the same amount of food around the same time each day. This includes weekend days, travel days, and days off work. Space your meals about 4 to 5 hours apart, and add a snack between them, if you wish.  For example, a daily food plan could include breakfast, a morning snack, lunch, dinner, and an evening snack. Healthy meals and snacks have different types of foods, including whole grains, vegetables, fruits, lean meats, poultry, fish, and dairy products. As you plan your meals, select a variety of foods. Choose from the bread and starch, vegetable, fruit, dairy, and meat/protein  groups. Examples of foods from each group are listed below with their suggested serving sizes. Use measuring cups and spoons to become familiar with what a healthy portion looks like. Bread and Starch Each serving equals 15 grams of carbohydrates. 1 slice bread. 1/4 bagel. 3/4 cup cold cereal (unsweetened). 1/2 cup hot cereal or mashed potatoes. 1 small potato (size of a computer mouse). 1/3 cup cooked pasta or rice. 1/2 English muffin. 1 cup broth-based soup. 3 cups of popcorn. 4 to 6 whole-wheat crackers. 1/2 cup cooked beans, peas, or corn. Vegetable Each serving equals 5 grams of carbohydrates. 1/2 cup cooked vegetables. 1 cup raw vegetables. 1/2 cup tomato or vegetable juice. Fruit Each serving equals 15 grams of carbohydrates. 1 small apple or orange. 1-1/4 cup watermelon or strawberries . 1/2 cup applesauce (no sugar added). 2 Tbsp raisins. 1/2 banana. 1/2 cup canned fruit, packed in water or in its own juice. 1/2 cup unsweetened fruit juice. Dairy Each serving equals 12 to 15 grams of carbohydrates. 1 cup fat-free milk. 6 oz artificially sweetened yogurt or plain yogurt. 1 cup low-fat buttermilk. 1 cup soy milk. 1 cup almond milk. Meat/Protein 1 large egg. 2 to 3 oz meat, poultry, or fish. 1/4 cup low-fat cottage cheese. 1 Tbsp peanut butter. 1 oz low-fat cheese. 1/4 cup tuna, packed in water. 1/2 cup tofu. Fat 1 tsp oil. 1 tsp trans-fat-free margarine. 1 tsp butter. 1 tsp mayonnaise. 2 Tbsp avocado. 1 Tbsp salad dressing. 1 Tbsp cream cheese. 2 Tbsp sour cream. SAMPLE 1800 CALORIE DIET PLAN Breakfast:  3/4 cup unsweetened cereal (1 carb choice).   1 cup fat-free milk (1 carb choice).   1 slice whole-wheat toast (1 carb choice).  1/2 small banana (1 carb choice).   1 scrambled egg.   1 tsp trans-fat-free margarine.  Lunch:  Tuna sandwich.   2 slices whole-wheat bread (2 carb choices).   1/2 cup canned tuna in water, drained.   1 Tbsp  reduced fat mayonnaise.   1 stalk celery, chopped.   2 slices tomato.   1 lettuce leaf.   1 cup carrot sticks.   24 to 30 seedless grapes (2 carb choices).   6 oz light yogurt (1 carb choice).  Afternoon Snack:  3 graham cracker squares (1 carb choice).   1 cup fat-free milk (1 carb choice).   1 Tbsp peanut butter.  Dinner:  3 oz salmon, broiled with 1 tsp oil.   1 cup mashed potatoes (2 carb choices) with 1 tsp trans-fat-free margarine.   1 cup fresh or frozen green beans.   1 cup steamed asparagus.   1 cup fat-free milk (1 carb choice).  Evening Snack:  3 cups of air-popped popcorn (1 carb choice).   2 Tbsp Parmesan cheese.  Meal Plan You can use this worksheet to help you make a daily meal plan based on the 1800 calorie diabetic diet suggestions. If you are using this plan to help you control your blood glucose, you may interchange carbohydrate-containing foods (dairy, starches, and fruits). Select a variety of fresh foods of varying colors and flavors. The total amount of carbohydrate in your meals or snacks is more important than making sure you include all of the food groups every time you eat. Choose from the approximate amount of the following foods to build your day's meals:  8 Starches.  4 Vegetables.   3 Fruits.  2 Dairy.   6 to 7 oz Meat/Protein.  Up to 4 Fats.   Your dietician can use this worksheet to help you decide how many servings and which types of foods are right for you. Breakfast Food Group and Servings Food Choice Starches ________________________________________________________ Dairy __________________________________________________________ Fruit ___________________________________________________________ Meat/Protein_____________________________________________________ Fat ____________________________________________________________ Lunch Food Group and Servings Food Choice Starch  _________________________________________________________ Meat/Protein ____________________________________________________ Vegetables ______________________________________________________ Fruit ___________________________________________________________ Dairy __________________________________________________________ Fat ____________________________________________________________ Afternoon Snack Food Group and Servings Food Choice Starch __________________________________________________________ Meat/Protein_____________________________________________________ Zada Girt ___________________________________________________________ Dairy __________________________________________________________ Dinner Food Group and Servings Food Choice Starches ________________________________________________________ Meat/Protein ____________________________________________________ Dairy __________________________________________________________ Vegetable ______________________________________________________ Fruit ___________________________________________________________ Fat ____________________________________________________________ Evening Snack Food Group and Servings Food Choice Fruit ___________________________________________________________ Meat/Protein ____________________________________________________ Dairy __________________________________________________________ Starch __________________________________________________________ Daily Totals Starches _________________________ Vegetables _________________________ Fruits _____________________________ Dairy _____________________________ Meat/Protein________________________ Fats _______________________________  Document Released: 09/15/2004 Document Re-Released: 08/13/2009 [employer] Patient Information 2011 [place], [place].   Calorie Counting Diet A calorie counting diet requires you to eat the number of calories that are right for you  during a day. Calories are the measurement of how much energy you get from the food you eat. Eating the right amount of calories is important for staying at a healthy weight. If you eat too many calories your body will store them as fat and you may gain weight. If you eat too few calories you may lose weight. Counting the number of calories that you eat during a day will help you to know if you're eating the right amount. A Registered Dietitian can determine how many calories you need in a day. The amount of calories you need varies from person to person. If your goal is to lose weight you will need to eat fewer calories. Losing weight can benefit you if you are overweight or have health problems such as heart  disease, high blood pressure or diabetes. If your goal is to gain weight, you will need to eat more calories. Gaining weight may be necessary if you have a certain health problem that causes your body to need more energy. TIPS Whether you are increasing or decreasing the number of calories you eat during a day, it may be hard to get used to changing what you eat and drink. The following are tips to help you keep track of the number of calories you are eating.  Measuring foods at home with measuring cups will help you to know the actual amount of food and number of calories you are eating.   Restaurants serve food in all different portion sizes. It is common that restaurants will serve food in amounts worth 2 or more serving sizes. While eating out, it may be helpful to estimate how many servings of a food you are given. For example, a serving of cooked rice is 1/2 cup and that is the size of half of a fist. Knowing serving sizes will help you have a better idea of how much food you are eating at restaurants.   Ask for smaller portion sizes or child-size portions at restaurants.   Plan to eat half of a meal at a restaurant and take the rest home or share the other half with a friend   Read food  labels for calorie content and serving size   Most packaged food has a Nutrition Facts Panel on its side or back. Here you can find out how many servings are in a package, the size of a serving, and the number of calories each serving has.   The serving size and number of servings per container are listed right below the Nutrition Facts heading. Just below the serving information, the number of calories in each serving is listed.   For example, say that a package has three cookies inside. The Nutrition Facts panel says that one serving is one cookie. Below that, it says that there are three servings in the container. The calories section of the Nutrition Facts says there are 90 calories. That means that there are 90 calories in one cookie. If you eat one cookie you have eaten 90 calories. If you eat all three cookies, you have eaten three times that amount, or 270 calories.  The list below tells you how big or small some common portion sizes are.  1 ounce (oz).................4 stacked dice.   3 oz.............................Marland KitchenDeck of cards.   1 teaspoon (tsp)..........Marland KitchenTip of little finger.   1 tablespoon (Tbsp).Marland KitchenMarland KitchenMarland KitchenTip of thumb.   2 Tbsp.........................Marland KitchenGolf ball.    Cup.........................Marland KitchenHalf of a fist.   1 Cup..........................Marland KitchenA fist.  KEEP A FOOD LOG Write down every food item that you eat, how much of the food you eat, and the number of calories in each food that you eat during the day. At the end of the day or throughout the day you can add up the total number of calories you have eaten.  It may help to set up a list like the one below. Find out the calorie information by reading food labels.  Breakfast   Bran Flakes (1 cup, 110 calories).   Fat free milk ( cup, 45 calories).   Snack   Apple (1 medium, 80 calories).   Lunch   Spinach (1 cup, 20 calories).   Tomato ( medium, 20 calories).   Chicken breast strips (3 oz, 165 calories).    Shredded cheddar cheese ( cup,  110 calories).   Light [place] dressing (2 Tbsp, 60 calories).   Whole wheat bread (1 slice, 80 calories).   Tub margarine (1 tsp, 35 calories).   Vegetable soup (1 cup, 160 calories).   Dinner   Pork chop (3 oz, 190 calories).   Brown rice (1 cup, 215 calories).   Steamed broccoli ( cup, 20 calories).   Strawberries (1  cup, 65 calories).   Whipped cream (1 Tbsp, 50 calories).  Daily Calorie Total: 1425 Information from www.eatright.org, Foodwise Nutritional Analysis Database. Document Released: 02/23/2005 Document Re-Released: 03/17/2009 [place] Patient Information 2011 [place], [place].   Follow up in 3 months. Buy a BP check machine and monitor your BP for next 3 months. Try and loose about 10 pounds a month for next 3 months.

## 2010-07-03 NOTE — Progress Notes (Signed)
  Subjective:    Patient ID: Nancy [name], female    DOB: 1955/07/28, 55 y.o.   MRN: [id number]  HPI  Patient is a 55 year old female patient of mine. Past medical history is most significant for morbid obesity and hypertension. This is a followup visit for hypertension.  Patient's blood pressure is 147/85 today.  Review of Systems     Objective:   Physical Exam        Assessment & Plan:

## 2010-07-04 NOTE — Assessment & Plan Note (Signed)
Discussed extensively the ways to lose weight. Can't about 1800-calorie diet and calorie counting on my [email or web address]/. Patient has vowed to lose at least 30 pounds prior to the next office visit in 3 months.

## 2010-07-04 NOTE — Assessment & Plan Note (Signed)
Blood pressure above goal. We reviewed past 2 year's trends of office blood pressure and weight. I printed a copy for her review. We noted that her weight was down by about 10-15 pounds and she was very well controlled. Patient is up to 270 pounds. I did not change her blood pressure medications today and would allow her to come down with weight loss.

## 2010-07-22 NOTE — Group Therapy Note (Signed)
NAMESADEEN, Nancy [name]                ACCOUNT NO.:  [id number]   MEDICAL RECORD NO.:  [id number]          PATIENT TYPE:  WOC   LOCATION:  [facility]                   FACILITY:  [facility]   PHYSICIAN:  Dorthula Perfect, MD     DATE OF BIRTH:  20-Jun-1955   DATE OF SERVICE:  08/26/2006                                  CLINIC NOTE   A 55 year old [place] American female returns for abnormal uterine  bleeding.  She was seen her September 2006.  She was seen by Dr. Shawnie Pons.  She had had abnormal uterine bleeding and an ultrasound had revealed an  endometrial stripe of 7 to 8 mm.  Dr. Shawnie Pons attempted endometrial biopsy  but this was apparently done with extreme difficulty.  Because of the  angle of the cervix and uterus and what was thought to be cervical  stenosis a sound could not be inserted.  On the November 18, 2004 hand-  written note there is a notation that shows the extreme angle of the  endocervical canal and the uterus.  Her size certainly does not help the  situation.   Her last menstrual period was May 23 and last for about a week.  It was  heavy enough to stain some of her clothing but not the bed sheets.  She  has a period about every 2 to 3 months.  She is having some hot flashes.  She is followed in the medical clinic by Dr. [name] for high blood  pressure and thyroid disease. Her last Pap smear September 2006 was  negative.   PHYSICAL EXAMINATION:  Height 5 feet3 inches, weight 278, blood pressure  144/88.  Examination of the abdomen reveals it to be markedly obese.  No masses  are felt.  Pelvic examination and rectovaginal external genitalia are difficult to  expose because of the infolding of her inner thighs.  They appear to be  normal.  Speculum is inserted and the cervix is seen high up in the  vagina and anterior.  I am not sure of seeing the cervical os.  A Pap  smear is done with difficulty.  Bimanual examination reveals the cervix  to be located high anterior in the  vagina and I believe the uterus is  upper limits of normal size, but this is also done with some difficulty  because of her body size.  Ovaries are not palpable.  Adnexal masses  could not be noted.   IMPRESSION:  Abnormal uterine bleeding.   DISPOSITION:  1. Pap smear.  2. A transvaginal ultrasound will again be ordered to check on the      endometrium.  If this is abnormal an attempt at endometrial biopsy      or maybe even a D&C would need to be done. I told the patient that      a D&C under anesthesia would be somewhat risky because of her      medical problems and her size.  She will be seen 2 weeks after the      ultrasound for results.           ______________________________  Dorthula Perfect, MD     ER/MEDQ  D:  08/26/2006  T:  08/27/2006  Job:  [id number]

## 2010-07-22 NOTE — Op Note (Signed)
NAMEOAKLIE, DURRETT                ACCOUNT NO.:  [id number]   MEDICAL RECORD NO.:  [id number]          PATIENT TYPE:  OIB   LOCATION:  5151                         FACILITY:  [facility]   PHYSICIAN:  Vania Rea. Supple, M.D.  DATE OF BIRTH:  [birth year]   DATE OF PROCEDURE:  01/04/2008  DATE OF DISCHARGE:                               OPERATIVE REPORT   PREOPERATIVE DIAGNOSES:  1. Chronic left shoulder impingement syndrome.  2. Left shoulder rotator cuff tear.  3. Left shoulder acromioclavicular joint arthrosis.   POSTOPERATIVE DIAGNOSES:  1. Chronic left shoulder impingement syndrome.  2. Left shoulder rotator cuff tear.  3. Left shoulder acromioclavicular joint arthrosis.  4. A complex and extensive degenerative labral tear.   PROCEDURE:  1. Left shoulder examination under anesthesia.  2. Left shoulder diagnostic arthroscopy.  3. Debridement of complex and extensive degenerative labral tear.  4. Arthroscopic subacromial decompression bursectomy.  5. Arthroscopic distal clavicle resection.  6. Arthroscopic rotator cuff repair utilizing a double-row suture      bridge repair construct.   SURGEON:  Vania Rea. Supple, MD   ASSISTANT:  Lucita Lora. Shuford, PA-C.   ANESTHESIA:  General endotracheal.  There was an attempt at a  preoperative interscalene block without success.   ESTIMATED BLOOD LOSS:  Minimal.   DRAINS:  None.   HISTORY:  Ms. Acrey is a 55 year old female with chronic left shoulder  pain with weakness and restrictions in mobility and progressively  increasing functional limitations.  Her examination shows severely  positive impingement sign with MRI scan showing evidence for a full-  thickness tear of the rotator cuff as well as AC joint arthrosis.  Due  to her ongoing pain and functional limitation, she is brought to the  operating at this time for planned left shoulder arthroscopy as  described below.   Preoperatively, I counseled Ms. Ponds on treatment options as  well as  risks versus benefits thereof.  Possible surgical complications  including infection, neurovascular injury, persistent pain, loss of  motion, anesthetic complication, recurrence of rotator cuff tear, and  possible need for additional surgery are reviewed.  She understands and  accepts and agrees with our planned procedure.   PROCEDURE IN DETAIL:  After undergoing routine preop evaluation, the  patient received prophylactic antibiotics.  An attempt was made an  interscalene block, but was unsuccessful.  The patient was brought to  the operating and placed supine on the operating table.  She underwent  smooth induction of general endotracheal anesthesia, although the  anesthesiologist did have to use GlideScope for appropriate  visualization of the airway.  The patient was then turned to the right  lateral decubitus position on the beanbag and appropriately padded and  protected.  A left shoulder examination under anesthesia revealed some  mild restrictions in mobility, but ultimately achieved approximately 160  degrees of abduction and 90 degrees of rotation internally and  externally.  Left arm was then suspended at 70 degrees of abduction with  15 pounds of traction.  Left shoulder girdle region was sterilely  prepped and draped in the standard fashion.  The posterior portal was  established in the glenohumeral joint and an anterior portal was  established under direct visualization.  The glenohumeral articular  surfaces were in good condition with some very limited chondromalacia  over the anterior and inferior quadrants of the glenoid and this area  was gently debrided.  There was extensive degenerative tearing of the  anterior, superior, and posterior aspects of the leg and these were all  debrided with a shaver to a stable peripheral margin.  The Arthrex wand  was used to obtain hemostasis.  Biceps tendon showed a stable attachment  proximally and distally.  There were no  obvious instability patterns.  The rotator cuff showed an obvious full-thickness tear of the distal  supraspinatus.  Remaining inspection of the glenohumeral joint showed no  obvious additional intraarticular pathology.  Fluid and instrument were  then removed.  The arm was dropped down to 30 degrees of abduction with  the arthroscope introducing in the subacromial space through the  posterior portal.  Direct lateral portal was established in the  subacromial space.  Abundant proliferative bursal tissue was encountered  and was divided and excised with a combination of the shaver and the  Arthrex wand.  The wand was then used to remove the periosteum from the  undersurface of the anterior half of the acromion.  There was a very  prominent anterior inferior acromial hook.  A bur was used to perform  subacromial abrasion, creating a type 1 morphology and nice  decompression of the subacromial space.  A portal was then established  anterior to the distal clavicle and the distal clavicle resection was  performed with the bur.  Care was taken to make sure entire  circumference of the distal clavicle could be visualized to ensure  adequate removal of bone.  We then completed the subacromial/subdeltoid  bursectomy.  The rotator cuff tear was readily evident and free margin  was debrided with a shaver as well as the greater tuberosity being  cleaned and abraded with a bur.  We established an accessory portal  device and then through a stab wound of the lateral margin of the  acromion and placed an Arthrex PEEK corkscrew suture anchor.  The limbs  of the suture anchor were then passed through the adjacent margin of the  rotator cuff and horizontal mattress construct using the Mitek suture  retriever and these were then tied with sliding locking knot followed by  multiple overhand throws and alternating posts.  We then created suture  bridge with 2 Arthrex push lock suture anchors and this nicely   reinforced, repaired, and compressed the free margin of the rotator cuff  against the bed on the tuberosity.  Suture limbs were then clipped.  Final hemostasis was obtained.  Fluid and instrument were removed.  The  ports were closed with Monocryl and Steri-Strips.  Bulky dry dressing  was taped over the left shoulder.  The left arm was placed in a sling  immobilizer.  The patient was supine, extubated, and taken to the  recovery room in stable condition.      Vania Rea. Supple, M.D.  Electronically Signed     KMS/MEDQ  D:  01/04/2008  T:  01/05/2008  Job:  [id number]

## 2010-07-25 NOTE — Group Therapy Note (Signed)
Nancy [name], Nancy [name] NO.:  [id number]   MEDICAL RECORD NO.:  [id number]          PATIENT TYPE:  WOC   LOCATION:  [facility]                   FACILITY:  [facility]   PHYSICIAN:  Argentina Donovan, MD        DATE OF BIRTH:  1955-04-08   DATE OF SERVICE:                                    CLINIC NOTE   The patient is a 55 year old black female gravida 5, para 4-0-1-4, who was  sent in by her internist because of irregular spotting and a thickened  endometrium at 8 cm.  Attempt was done by Dr. Shawnie Pons to do an endometrial  biopsy, but cervical stenosis caused her to abort that procedure and to  repeat the ultrasound.  That was done several months later and there has  been no further extension of the endometrium, which is still at 8 mm, and  very regular.  The patient has not had any bleeding in the last three months  and we are going to have her keep a good menstrual record and come back in  three months to see [place].  I see no reason to do a D&C at this point.  She has  well-controlled blood pressure, so we will consider oral contraceptives in  the future.   DIAGNOSIS:  Menopausal symptoms.           ______________________________  Argentina Donovan, MD     PR/MEDQ  D:  02/24/2005  T:  02/24/2005  Job:  [id number]

## 2010-07-25 NOTE — Discharge Summary (Signed)
Nancy [name], Nancy [name]                ACCOUNT NO.:  [id number]   MEDICAL RECORD NO.:  [id number]          PATIENT TYPE:  INP   LOCATION:  3731                         FACILITY:  [facility]   PHYSICIAN:  Hollace Hayward, M.D.   DATE OF BIRTH:  1955-07-19   DATE OF ADMISSION:  12/16/2005  DATE OF DISCHARGE:  12/20/2005                                 DISCHARGE SUMMARY   CHIEF COMPLAINT:  Back pain and chest pain.   CONTINUITY DOCTOR:  [name], MD.   [facility]   DISCHARGE DIAGNOSES:  1. Chest pain.  2. Back pain.  3. Hypertension.  4. Hypothyroidism.  5. Sjogren's syndrome.  6. Anemia, microcytic likely secondary to menorrhagia.   DISCHARGE MEDICATIONS:  1. Darvocet-N 100 1 tab as needed every 6 hours p.r.n., dispense #120.  2. Aspirin 81 mg daily.  3. HCTZ 25 mg daily.  4. Levothyroxine 50 mcg daily.  5. Iron sulfate 325 mg p.o. t.i.d.  6. Metoprolol 25 mg p.o. b.i.d.  7. Prednisone, take package already described by Dr. [name] as follows:      Take 1 tab the day after admission, this is 10 mg.  For the days      following, take 1/2 tablet/5 mg until all of the package is gone; then      refill your usual prescription and take 2.5 mg daily.  8. ACE inhibitor unknown dosage, prescribed by the cardiologist.   DISPOSITION AND FOLLOWUP:  The patient discharged to home.  The patient will  follow up with Dr. [name] on Wednesday, October the 24th at 2 p.m.  The  patient will follow up with Dr. Clarene Duke at [facility] as needed.   PROCEDURES AND STUDIES:  1. Chest x-ray, December 16, 2005, showing cardiomegaly with chronic      interstitial changes.  No change since February of 2005.  2. December 18, 2005 - 2D echo showing left ventricular normal size with an      ejection fraction of 55-60%.  3. December 19, 2005 - adenosine Myoview showing moderate decrease uptake      in the anterior and apical walls, with reversibility at rest,  suspicious for myocardial ischemia.  No left ventricular wall      abnormalities, with ejection fraction of 66%.   ADMISSION H&P:  The patient is a 55 year old African American female with a  past medical history of hypertension, morbid obesity, chronic low back pain,  hypothyroidism, and anemia, who presents to clinic on the day of admission  with severe lower back pain for several days that radiates down both legs.  She has had this pain since a diskectomy in 1999.  The pain is sharp, 10/10,  and feels like needles radiating down her legs.  Upon leaving clinic that  day, she had an episode of substernal left-sided chest pain that was very  severe and became short of breath at that time.  She has chronic shortness  of breath and back pain before that has been associated with pain.  The pain  lasted approximately 30 seconds to 2 minutes and  went away.  She was  admitted to the hospital overnight.  She had one additional episode of chest  pain that awoke her from sleep.  It was associated also with shortness of  breath.  She had no further episodes after that.  The patient on admission,  was also complaining of moderate lower extremity swelling over the past  month, since her blood pressure medications have been changed.  She uses  CPAP at home for obstructive sleep apnea.  Sleeps on 6 pillows due to her  back pain.  Also has chronic baseline shortness of breath on exertion with  only a few steps.  She denied fevers, chills, hot flashes, nausea, vomiting,  or diarrhea.  The patient has had a history of chest pain associated with  pain and edema in 2002.   ADMISSION PHYSICAL EXAM:  VITALS:  Temperature 97.2, blood pressure 180/93,  pulse 78, respirations 22, oxygen saturation 99% on room air.  Weight 300  pounds.  GENERAL:  The patient is a morbidly obese African American female lying in  bed in mild distress secondary to back pain.  EYES:  Pupils equal, round and reactive to light and  accommodation.  Extraocular muscles intact.  Anicteric.  ENT:  Oropharynx clear.  No thyromegaly.  Unable to assess JVD given body  habitus.  NECK:  Supple.  RESPIRATIONS:  Clear to auscultation bilaterally.  No wheezes, rales or  rhonchi.  CARDIOVASCULAR:  Regular rate and rhythm with S1 and S2 appreciated.  GI:  Abdomen soft, nontender, nondistended.  Positive bowel sounds.  No  palpable organomegaly.  EXTREMITIES:  2+ pitting edema in the lower extremities bilaterally.  NEURO:  Alert and oriented x3.  Cranial nerves II-XII grossly intact.  Normal sensation.  Moves all 4 extremities.  Unable to assess lower  extremity strength secondary to pain.   ADMISSION LABS:  White blood cell count 4.7, hemoglobin 10.3, platelets 247,  sodium 138, potassium 4.5, chloride 104, bicarb 27, BUN 5, creatinine 0.9,  glucose 152.  Fasting lipid profile:  Cholesterol 150, triglycerides 75, HDL  47, LDL 88.  Initial cardiac enzymes:  CK 99, CK MB 1.2, troponin-I less  than 0.02.   HOSPITAL COURSE:  1. Chest pain.  The patient had 2 episodes of chest pain, substernal      associated with shortness of breath that went away after 2 minutes.      The pain was somewhat concerning for cardiac in origin; however,      patient had cardiac enzymes x3 that were negative.  She had an      echocardiogram that showed a left ventricular ejection fraction of 55-      60%.  She had an adenosine Myoview, which showed moderate decrease      uptake of the anterior and apical walls and reversibility at rest      suspicious for myocardial ischemia.  She was seen by Cardiology and      declined catheterization at this point.  The patient said that she      wanted to think about having the procedure.  The patient was optimized      medically, including aspirin, beta blocker, ACE inhibitor.  She will      follow up with her primary care physician, Dr. [name], in     approximately 1 week to revisit the idea of having  catheterization.      She will return to the emergency room if she has any recurrent chest  pain.  2. Back pain.  The patient's back pain is chronic and likely secondary to      her diskectomy in 1999.  The patient was given p.r.n. Darvocet for this      pain and will follow up in clinic as needed.  3. Hypertension.  The patient was admitted with only HCTZ 25 mg.  The      patient's blood pressures were elevated on time of admission, and      during admission, she was started on metoprolol as well as an ACE      inhibitor.  Will follow up as outpatient for titration of these      medications.  4. Hypothyroidism.  The patient is on levothyroxine 50 mcg daily.  Her TSH      was 1.98.  The patient was continued on current dose and will follow up      as an outpatient.  5. Sjogren's syndrome.  The patient was continued on a prednisone taper      starting at 60 mg.  She will continue this taper at home, down to her      previous dose of 2.5 mg, which she will continue to take daily.  She      will follow up with Dr. [name] as well as her rheumatologist as      needed.  6. Anemia.  The patient has a chronic anemia microcytic that was unchanged      from her clinic visit.  This is likely secondary to her menorrhagia.      The patient will need to follow up with OB/GYN regarding her increase      in endometrial stripe.  She will continue her iron sulfate as an      outpatient.   DISCHARGE LABS:  White blood cell count 4.1, hemoglobin 10.4, platelets 201,  sodium 138, potassium 4.5, chloride 104, bicarb of 27, BUN 5, creatinine  0.9, glucose 152.   DICTATED BY:  Schuyler Amor , MS4      Hollace Hayward, M.D.  Electronically Signed     TE/MEDQ  D:  12/21/2005  T:  12/21/2005  Job:  [id number]

## 2010-07-25 NOTE — Discharge Summary (Signed)
[place]  Patient:    Nancy [name], Nancy [name]                       MRN: [id number] Adm. Date:  [id number] Disc. Date: [id number] Attending:  Alfonso Ramus Dictator:   Bernerd Pho, M.D. CC:         Talmage Coin, M.D., [facility]   Discharge Summary  DISCHARGE DIAGNOSES: 1. Neck discomfort and upper chest discomfort likely secondary to viral    infection with possible contribution by gastroesophageal reflux disease. 2. Diffuse enlargement of the thyroid gland, consistent with a goiter with    normal thyroid function (TSH and free T4). 3. Iron-deficiency anemia. 4. Chronic low back pain.  DISCHARGE MEDICATIONS: 1. Ibuprofen 800 mg p.o. t.i.d. with meals x 10 days. 2. Elavil 25 mg p.o. q.h.s. 3. Protonix 40 mg p.o. q.d. 4. Iron 325 mg p.o. t.i.d. with meals. 5. Vicodin one or two tablets p.o. q.6h. p.r.n. breakthrough pain.  CONSULTATIONS:  None.  PROCEDURES:  The patient had a soft tissue ultrasound of the neck performed on Aug 03, 2000, to evaluate for the possibility of thyroiditis and thyroid goiter.  The ultrasound of the neck showed diffusely enlarged thyroid gland. The left lobe measured 3.6 x 3.1 x 3.6 cm and the right lobe measured 6.5 x 2.8 x 3 cm.  The thyroid isthmus is also thickened measuring 1.6 cm.  There is no evidence of a focal thyroid mass or cyst. The overall impression was diffuse enlargement of thyroid gland consistent with a goiter. No evidence of nodules.  CHIEF COMPLAINT:  Neck and chest discomfort.  HISTORY OF PRESENT ILLNESS:  The patient is a 55 year old morbidly obese African-American female with past medical history of hypothyroidism and anemia who presents with a three-day history of neck/throat/upper chest discomfort. The patient localized the discomfort in the right anterior neck.  The patient states that the discomfort began roughly three days prior to admission and  was associated with some lower extremity swelling which she has had in the past. The patient reports difficulty with swallowing in regard to feeling that something gets hung in her throat.  The patient also reports some substernal chest pressure which she describes as sharp pain which is exacerbated by coughing and swallowing. The patient has not tried any medications to alleviate the discomfort. The patient reports that the lower extremity edema is resolving.  PAST MEDICAL HISTORY: 1. Chronic low back pain, status post L4-L5 discectomy in November of 1999. 2. Anemia secondary to menorrhagia with baseline hemoglobin 9.8 to 10.5. 3. Hypothyroidism per record.  ALLERGIES:  ALEVE.  MEDICATIONS ON ADMISSION:  The patient states that she has not taken her medications in several months.  The patients medications per record include: 1. Levoxyl 100 mcg p.o. q.d. 2. Elavil 25 mg p.o. q.h.s. 3. Acetaminophen one or two tablets b.i.d. to t.i.d. p.r.n. 4. Iron 325 mg p.o. t.i.d.  PHYSICAL EXAMINATION:  GENERAL APPEARANCE:  The patient is an obese African-American female in no apparent distress.  VITAL SIGNS:  Temperature 100.4, blood pressure 179/109, pulse 97, respiratory rate 20, saturation on room air 97%.  HEENT:  Pupils are equal, round and reactive to light.  Extraocular movements intact.  Oropharynx clear without any erythema or edema.  Two slightly shotty lymph nodes in regard to cervical lymphadenopathy.  The patient is tender to palpation on the right aspect of the neck.  Palpation remarkable for the  sternocleidomastoid muscle which appears slightly tense.  The patients thyroid appears fairly normal but the isthmus appears slightly enlarged.  CHEST:  The patients chest is tender to palpation, eliciting the sharp discomfort.  LUNGS:  Clear to auscultation bilaterally.  No wheezes, rhonchi or crackles.  CARDIOVASCULAR:  Regular rate and rhythm, no murmurs, clicks, gallops,  or rubs.  ABDOMEN:  Soft. Bowel sounds present.  Abdomen is nondistended and nontender. No hepatosplenomegaly.  EXTREMITIES:  There is 1+ pitting edema in the lower extremities bilaterally.  NEUROLOGICAL:  Cranial nerves II-XII grossly intact.  She moves all extremities well.  ADMISSION LABORATORIES:  EKG showed normal sinus rhythm with slight T-wave inversion in lead III.  The patients chest x-ray showed no active disease.  The patients plane film of the neck showed no abnormalities and overall impression was normal appearance of the neck soft tissues.  There was no evidence of abscess or other soft tissue enlargement.  The patients sodium was 135, potassium 3.8, chloride 104, bicarb 27, BUN 9, creatinine 0.8, glucose 100.  AST 15, ALT 10, alkaline phosphatase 54, total bilirubin 1, albumin 3.2, calcium 8.6, total protein 7.8. The patients hemoglobin was 10.1, white count 7.8, platelets 352 with an ANC of 5.1 and ALC of 1.9.  The patients beta HCG was negative.  The patients UA was negative for nitrite, leukocytes and blood. The patients TSH was 3.527 and the patients free T4 was 9.7.  The patients ferritin was 45.  The patients ABG on room air was 7.36/47/79/25/96%.  HOSPITAL COURSE:  NECK DISCOMFORT AND UPPER CHEST DISCOMFORT WITH MYALGIAS LIKELY SECONDARY TO VIRAL ETIOLOGY:  The patient was admitted and was stable during this admission.  The patient remained afebrile during this admission. The patients vitals were stable including normotensive blood pressures.  The patient tolerated meals well during this admission and did not show any signs of nutritional deficiency secondary to odynophagia or dysphagia. The patient had an ultrasound of the neck performed on Aug 03, 2000, to rule out a cyst or any abscesses.  The patients ultrasound was fairly normal. The patient had a TSH and T4 measured which were normal surprisingly.  Therefore, the patient is now being discharged on  thyroid supplementation. Because of the patients  overall well appearance, the patients discomfort was attributed secondary to a viral illness.  The patient is being discharged on ibuprofen 800 mg p.o. t.i.d. for 10 days and Protonix for a possible component of GERD which may be exacerbating the patients symptoms since she is obese.  The patient is also being discharged on Elavil which she was on before for the possibility of slight depression associated with her symptoms. The patient also has Vicodin written for her for breakthrough pain.  DISPOSITION:  The patient is being discharged to home.  RESIDENT PHYSICIAN:  Karlene Einstein, M.D. and Bernerd Pho, M.D.  ATTENDING PHYSICIAN:  Dineen Kid. Reche Dixon, M.D. DD:  08/03/00 TD:  08/03/00 Job: [id number]

## 2010-07-25 NOTE — Procedures (Signed)
NAME:  Nancy [name], Nancy [name]              ACCOUNT NO.:  [id number]   MEDICAL RECORD NO.:  [id number]          PATIENT TYPE:  OUT   LOCATION:  SLEE                         FACILITY:  [facility]   PHYSICIAN:  Clinton D. Maple Hudson, M.D. DATE OF BIRTH:  06-07-1955   DATE OF ADMISSION:  09/19/2003  DATE OF DISCHARGE:  09/19/2003                              NOCTURNAL POLYSOMNOGRAM   REFERRING PHYSICIAN:  Dr. [name].   INDICATION FOR STUDY/HISTORY:  Hypersomnia sleep apnea, witnessed apneas,  nocturnal choking, weight gain, daytime fatigue, hypothyroidism, anemia,  menstrual irregularity, arthralgias, and hypertension.  Home medications  include Protonix, prednisone, HCTZ, Norvasc, levothyroxine; and she took  hydrocodone around 9:15 p.m. on the study night.  A split study protocol was  requested.  Epworth sleepiness scored 12/24.  BMI 49.5, weight 280 lb.   SLEEP ARCHITECTURE:  374 minutes of sleep were recorded, adequate for  evaluation with a sleep deficiency of 75%.  Of total sleep time, 3% with  stage one, 46% with stage two, stages three and four were absent, 26% was  REM.  Sleep latency was nine minutes.  REM latency 227 minutes.  Arousal  index 43 per hour.   RESPIRATORY DATA:  Split study protocol, severe obstructive sleep/hypopnea  syndrome with a baseline RDI of 61 obstructive events per hour before CPAP.  During 192 minutes of sleep there was one obstructive apnea and 103  obstructive hypopneas.  Events were not positional, and not predominantly in  REM.  CPAP was then titrated to 18 CWP, RDI of 0 per hour, using a petite  Profile Comfort Gel Rest Bronec's mask with chin strap and a heated  humidifier.   OXYGEN DATA:  Moderate to loud snoring before CPAP with desaturations at  70%.  Subsequent normal oxygenation at 94-95% on CPAP with good control.   CARDIAC DATA:  Normal sinus rhythm with occasional PAC and PVC, a few  dropped beats, no significant ectopics.   MOVEMENT/PARASOMNIA:  Insignificant occasional leg jerks.   IMPRESSION/RECOMMENDATION:  Severe obstructive sleep apnea/hypopnea syndrome  (RDI 61 per hour) with mild to moderate oxygen desaturation, occasional PACs  and PVCs.  Good control on CPAP at 18 CWP.                                   ______________________________                                Rennis Chris. Maple Hudson, M.D.                                Diplomate, American Board of Sleep Medicine   CDY/MEDQ  D:  09/23/2003 17:13:42  T:  09/24/2003 17:39:55  Job:  [id number]

## 2010-07-25 NOTE — Group Therapy Note (Signed)
NAMEKORRYN, PANCOAST                ACCOUNT NO.:  [id number]   MEDICAL RECORD NO.:  [id number]          PATIENT TYPE:  WOC   LOCATION:  [facility]                   FACILITY:  [facility]   PHYSICIAN:  Nancy Gens, MD        DATE OF BIRTH:  1956-03-01   DATE OF SERVICE:  11/18/2004                                    CLINIC NOTE   CHIEF COMPLAINT:  Patient referred for abnormal ultrasound revealing an  endometrial stripe thickness of 7-8 mm.   SUBJECTIVE:  Nancy [name] is a 55 year old female who comes to clinic today  for evaluation for abnormal endometrial stripe measuring 7-8 mm by  ultrasound as well as abnormal periods.  Her primary care physician at the  [facility], Dr. [name], sent her for evaluation.  She  states today that she does, in fact, have abnormal periods, often times  missing several months.  Her last menstrual period was on November 09, 2004,  but lasted only a day.  Often times she has much heavier periods lasting up  to nine days.  She is here today asking about endometrial biopsy and would  like the procedure explained to her.   OBJECTIVE:  VITAL SIGNS:  Temperature 96.5, pulse 96, blood pressure 148/82,  weight 276.4, height 5 feet 3 inches.  GENERAL:  She is alert and oriented x4, in no acute distress.  CHEST:  Clear to auscultation bilaterally.  CARDIOVASCULAR:  She has normal S1, S2.  No murmurs, rubs, or gallops.  ABDOMEN:  Obese, nontender, nondistended with positive bowel sounds in all  four quadrants.  GENITALIA:  Genital examination revealed normal external female genitalia,  normal-appearing vagina and cervix.  Bimanual examination revealed nothing  appreciable due to body habitus.  Pap smear was performed.   ASSESSMENT/PLAN:  Increased endometrial stripe.   PLAN:  Patient was consented and Pap smear was performed followed by  cleaning her cervix with Betadine for preparation for endometrial biopsy.  Unfortunately, the cervix could not be  penetrated with the sound.  Due to  patient intolerability and cervical stenosis the procedure was aborted.  We  will follow her up in two months' time with vaginal ultrasound at which time  if the stripe has increased we will discuss the patient further work-up,  possible OR D&C.    ______________________________  Nancy [name], M.D.    ______________________________  Nancy Gens, MD   /MEDQ  D:  11/18/2004  T:  11/19/2004  Job:  [id number]   cc:   Charm Rings, M.D.

## 2010-07-31 ENCOUNTER — Emergency Department (HOSPITAL_COMMUNITY)
Admission: EM | Admit: 2010-07-31 | Discharge: 2010-07-31 | Disposition: A | Discharge status: Home / Self Care | Payer: Medicare Other | Attending: Emergency Medicine | Admitting: Emergency Medicine

## 2010-07-31 ENCOUNTER — Emergency Department (HOSPITAL_COMMUNITY): Payer: Medicare Other

## 2010-07-31 DIAGNOSIS — J40 Bronchitis, not specified as acute or chronic: Secondary | ICD-10-CM | POA: Insufficient documentation

## 2010-07-31 DIAGNOSIS — E039 Hypothyroidism, unspecified: Secondary | ICD-10-CM | POA: Insufficient documentation

## 2010-07-31 DIAGNOSIS — R059 Cough, unspecified: Secondary | ICD-10-CM | POA: Insufficient documentation

## 2010-07-31 DIAGNOSIS — R220 Localized swelling, mass and lump, head: Secondary | ICD-10-CM | POA: Diagnosis not present

## 2010-07-31 DIAGNOSIS — H9209 Otalgia, unspecified ear: Secondary | ICD-10-CM | POA: Diagnosis not present

## 2010-07-31 DIAGNOSIS — R0609 Other forms of dyspnea: Secondary | ICD-10-CM | POA: Insufficient documentation

## 2010-07-31 DIAGNOSIS — IMO0001 Reserved for inherently not codable concepts without codable children: Secondary | ICD-10-CM | POA: Diagnosis not present

## 2010-07-31 DIAGNOSIS — J3489 Other specified disorders of nose and nasal sinuses: Secondary | ICD-10-CM | POA: Diagnosis not present

## 2010-07-31 DIAGNOSIS — I10 Essential (primary) hypertension: Secondary | ICD-10-CM | POA: Diagnosis not present

## 2010-07-31 DIAGNOSIS — R509 Fever, unspecified: Secondary | ICD-10-CM | POA: Diagnosis not present

## 2010-07-31 DIAGNOSIS — R5381 Other malaise: Secondary | ICD-10-CM | POA: Insufficient documentation

## 2010-07-31 DIAGNOSIS — R071 Chest pain on breathing: Secondary | ICD-10-CM | POA: Diagnosis not present

## 2010-07-31 DIAGNOSIS — R51 Headache: Secondary | ICD-10-CM | POA: Diagnosis not present

## 2010-07-31 DIAGNOSIS — R070 Pain in throat: Secondary | ICD-10-CM | POA: Diagnosis not present

## 2010-07-31 DIAGNOSIS — R05 Cough: Secondary | ICD-10-CM | POA: Insufficient documentation

## 2010-07-31 DIAGNOSIS — R221 Localized swelling, mass and lump, neck: Secondary | ICD-10-CM | POA: Diagnosis not present

## 2010-07-31 DIAGNOSIS — Z79899 Other long term (current) drug therapy: Secondary | ICD-10-CM | POA: Insufficient documentation

## 2010-07-31 DIAGNOSIS — R5383 Other fatigue: Secondary | ICD-10-CM | POA: Diagnosis not present

## 2010-07-31 DIAGNOSIS — R0989 Other specified symptoms and signs involving the circulatory and respiratory systems: Secondary | ICD-10-CM | POA: Insufficient documentation

## 2010-07-31 LAB — RAPID STREP SCREEN (MED CTR MEBANE ONLY): Streptococcus, Group A Screen (Direct): NEGATIVE

## 2010-09-17 ENCOUNTER — Other Ambulatory Visit: Payer: Self-pay | Admitting: *Deleted

## 2010-09-17 ENCOUNTER — Telehealth: Payer: Self-pay | Admitting: *Deleted

## 2010-09-17 MED ORDER — HYDROCODONE-ACETAMINOPHEN 5-500 MG PO TABS: 1.0000 | ORAL_TABLET | Freq: Three times a day (TID) | ORAL | Status: DC | PRN

## 2010-09-17 MED ORDER — AMLODIPINE BESYLATE 10 MG PO TABS: 10.0000 mg | ORAL_TABLET | Freq: Every day | ORAL | Status: DC

## 2010-09-17 NOTE — Telephone Encounter (Signed)
Seen 4/26 and asked to return in 3 months. No appt in EPIC> Send to front desk to sch.

## 2010-09-17 NOTE — Telephone Encounter (Signed)
Vicodin rx called to [facility].

## 2010-09-17 NOTE — Telephone Encounter (Signed)
Last Rx 03/13/2010 #60 2 refills. Doesn't seem to be chronic med. No contract. Will refill and request appt

## 2010-09-17 NOTE — Telephone Encounter (Signed)
Message sent to front desk for an appt. 

## 2010-09-22 ENCOUNTER — Telehealth: Payer: Self-pay | Admitting: *Deleted

## 2010-09-22 NOTE — Telephone Encounter (Signed)
Pt calls and requests namebrand norvasc and not generic, i reminded her that insurance may not cover it since it was not medically necessary, she states that ins will, i called the pharm as she ask and told the pharm that it is a pt desire, she states she will call pt and speak to her as pt has picked up med.

## 2010-09-26 ENCOUNTER — Telehealth: Payer: Self-pay | Admitting: *Deleted

## 2010-09-26 ENCOUNTER — Encounter: Payer: Self-pay | Admitting: Internal Medicine

## 2010-09-26 ENCOUNTER — Ambulatory Visit (INDEPENDENT_AMBULATORY_CARE_PROVIDER_SITE_OTHER): Payer: Medicare Other | Admitting: Internal Medicine

## 2010-09-26 VITALS — BP 141/83 | HR 89 | Temp 97.9°F | Ht 63.0 in | Wt 275.6 lb

## 2010-09-26 DIAGNOSIS — R609 Edema, unspecified: Secondary | ICD-10-CM

## 2010-09-26 DIAGNOSIS — R600 Localized edema: Secondary | ICD-10-CM | POA: Insufficient documentation

## 2010-09-26 MED ORDER — AMLODIPINE BESYLATE 10 MG PO TABS: 10.0000 mg | ORAL_TABLET | Freq: Every day | ORAL | Status: DC

## 2010-09-26 MED ORDER — HYDROXYZINE HCL 25 MG PO TABS: 25.0000 mg | ORAL_TABLET | Freq: Three times a day (TID) | ORAL | Status: AC | PRN

## 2010-09-26 NOTE — Progress Notes (Deleted)
  Subjective:    Patient ID: Nancy [name], female    DOB: 09/23/1955, 55 y.o.   MRN: [id number]  HPI    Review of Systems     Objective:   Physical Exam        Assessment & Plan:

## 2010-09-26 NOTE — Patient Instructions (Signed)
Please return to clinic for follow-up in two weeks.  Please return in 4-5 days if the swelling has not begun to improve.  If you feet becomes increasingly painful or numb, please come to clinic or go to the emergency department.

## 2010-09-26 NOTE — Progress Notes (Signed)
Subjective:    Patient ID: Nancy [name], female    DOB: [birth year], 55 y.o.   MRN: [id number]  HPI Ms. Kell is a 55 y/o with a history of HTN and morbid obesity who presents with recent onset of bilateral foot and ankle swelling.  Swelling has started since refilling her prescription for amlodipine on 09/17/10.  The swelling is bilateral and symmetric and associated with tenderness and itching.  She reports that her feet feel tight, but no decrease in sensation.  She notes no hand swelling or rash anywhere.  No fever or chills.  No nausea or vomiting.  No SOB, throat tightness, or CP.  The last time she took her new amlodipine prescription was the day before yesterday.  No other new medications.  No new exposures that she reports.    She reports that this was the first time she has taken generic amlodipine and that previously she always took brand name Norvasc without any problems.     Review of Systems Review of Systems - History obtained from the patient General ROS: negative for - chills, fatigue, fever or weight gain Respiratory ROS: no cough, shortness of breath, or wheezing Cardiovascular ROS: no chest pain or dyspnea on exertion Gastrointestinal ROS: no abdominal pain, change in bowel habits, or black or bloody stools Genito-Urinary ROS: no dysuria, trouble voiding, or hematuria Musculoskeletal ROS: No joint pains. see HPI for extremities Neurological ROS: negative for - confusion, dizziness or memory loss Dermatological ROS: negative for rash    Objective:   Physical Exam Filed Vitals:   09/26/10 1622  BP: 141/83  Pulse: 89  Temp: 97.9 F (36.6 C)   General: alert, well-developed, and cooperative to examination.  Head: normocephalic and atraumatic.  Eyes: vision grossly intact, pupils equal, pupils round, pupils reactive to light, no injection and anicteric.  Mouth: pharynx pink and moist, no erythema, and no exudates.  Neck: no JVD.  Lungs: normal respiratory effort, no  accessory muscle use, normal breath sounds, no crackles, and no wheezes. Heart: normal rate, regular rhythm, no murmur, no gallop, and no rub.  Msk: no joint swelling, no joint warmth, and no redness over joints.  Pulses: 2+ DP/PT pulses bilaterally Extremities: Significant non-pitting edema in both feet up to the mid calf bilaterally.  No erythema. No increased warmth.  Symmetric. No cyanosis, clubbing. Neurologic: alert & oriented X3, cranial nerves II-XII intact, strength normal in all extremities, sensation intact to light touch in both feet. Skin: turgor normal and no rashes.  Psych: Oriented X3, memory intact for recent and remote, normally interactive, good eye contact, not anxious appearing, and not depressed appearing.     Assessment & Plan:

## 2010-09-26 NOTE — Telephone Encounter (Signed)
Pt and 2 family members call and state pt is having a reaction to amlodipine and that the pharm needs to be told to give her name brand norvasc. The reaction she states is that her feet are swelling. i called [facility] and was told by pharm that in jan 2012 pt got namebrand norvasc but has not gotten either norvasc or amlodipine since then until 09/19/2010, pt states she has been taking it for 3 days and cannot tolerate the swelling. It is now medicare's policy that if namebrand is used that documentation must be provided to state exactly why. For this reason i have spoken to dr Coralee Pesa so the pt may be seen today and this evaluated.

## 2010-09-28 ENCOUNTER — Encounter: Payer: Self-pay | Admitting: Internal Medicine

## 2010-09-28 NOTE — Assessment & Plan Note (Addendum)
Given the timing and no other new exposures, this edema is likely the result of a reaction to her new amlodipine prescription.  No SOB or pulmonary findings on exam to suggest fluid overload, and this edema is not pitting.  Its rapid onset does not fit fluid overload either.    Since she has previously tolerated Norvasc, this is likely a reaction to an ingredient in the specific generic preparation rather than the active ingredient amlodipine.    Plan: Gave a new prescription for Norvasc brand name only.  Called her insurance company and had this approved for her for 12 months.   Was instructed to return in 4-5 days if the swelling does not improve, otherwise return in 2 weeks for follow-up. Was instructed to call clinic or go to emergency room if swelling becomes worse, she developed significant pain, or she begins to lose any feeling in her feet. Hydroxyzine 25mg  TIDPRN for itching

## 2010-09-29 NOTE — Progress Notes (Signed)
Patient seen and examined. I agree with assessment and plan as per Dr. Wainwright. 

## 2010-10-13 ENCOUNTER — Ambulatory Visit (INDEPENDENT_AMBULATORY_CARE_PROVIDER_SITE_OTHER): Payer: Medicare Other | Admitting: Internal Medicine

## 2010-10-13 DIAGNOSIS — I10 Essential (primary) hypertension: Secondary | ICD-10-CM

## 2010-10-13 DIAGNOSIS — R600 Localized edema: Secondary | ICD-10-CM

## 2010-10-13 DIAGNOSIS — Z1211 Encounter for screening for malignant neoplasm of colon: Secondary | ICD-10-CM

## 2010-10-13 DIAGNOSIS — R609 Edema, unspecified: Secondary | ICD-10-CM

## 2010-10-13 MED ORDER — METOPROLOL TARTRATE 50 MG PO TABS: 25.0000 mg | ORAL_TABLET | Freq: Two times a day (BID) | ORAL | Status: DC

## 2010-10-13 NOTE — Patient Instructions (Addendum)
   Please follow-up at the clinic in 4 weeks, at which time we will reevaluate your feet swelling.  STOP Amlodipine  Increase your Metoprolol to 50mg  twice daily, I have sent this to your pharmacy already - if you start having lightheadedness, dizziness call the clinic and come in immediately.  If your feet symptoms worsen, or new symptoms arise (fevers, chills, leg swelling with warmth and redness that is unequal), please call the clinic or go to the ER.  Please bring all of your medications in a bag to your next visit.

## 2010-10-13 NOTE — Assessment & Plan Note (Signed)
BP Readings from Last 3 Encounters:  10/13/10 139/94  09/26/10 141/83  07/03/10 147/85    Basic Metabolic Panel:    Component Value Date/Time   NA 135 06/16/2010 1056   K 3.9 06/16/2010 1056   CL 99 06/16/2010 1056   CO2 31 06/16/2010 1056   BUN 14 06/16/2010 1056   CREATININE 0.85 06/16/2010 1056   CREATININE 0.78 08/15/2009 2121   GLUCOSE 96 06/16/2010 1056   CALCIUM 8.9 06/16/2010 1056    Assessment: Hypertension control:   controlled  Progress toward goals:   at goal Barriers to meeting goals:  Will have to be changed of her Amlodipine in the setting of her LE edema.  Plan: Hypertension treatment: - Stop Amlodipine 2/2 lower extremity edema - Increase Metoprolol to 50mg  BID, which should be able to be tolerated by her heart rate as well.

## 2010-10-13 NOTE — Progress Notes (Addendum)
Subjective:    Patient ID: Nancy [name], female    DOB: 1955/03/17, 55 y.o.   MRN: [id number]  HPI Pt is a 55 y.o. female who has a past medical history of Primary Sjogren's syndrome; HTN; OSA; Hypothyroidism; Polyarthralgia; and who presents to clinic today for the following:   1) LE edema follow-up - the patient has been having persistent and worsening bilateral feet and LE swelling since 09/26/2010 when she was seen in the clinic. During his last clinic visit, there is concern that the patient's bilateral feet swelling with secondary to byproducts in her amlodipine, which was changed from the brand name Norvasc just prior to onset of symptoms. Therefore, amlodipine was discontinued, and brand name Norvasc was prescribed at that time. Since last clinic visit on 09/26/2010, the patient notes that she's had persistent bilateral foot swelling and pain. She denies associated paresthesias, numbness, decreased sensation, weakness of lower extremities or feet. Denies erythema, asymmetric swelling, pain in her calves bilaterally. The patient states that she is fairly mobile at baseline with assistance of a cane.   2) HTN - Patient does not check blood pressure regularly at home. Currently taking Amlodipine 10mg , Lisinopril-HCTZ 20-25mg . denies headaches, dizziness, lightheadedness, chest pain, shortness of breath.  Does not request refills today.   Review of Systems Per HPI.  Current Outpatient Medications Medication Sig  . aspirin 81 MG EC tablet Take 81 mg by mouth daily.    . ferrous sulfate 325 (65 FE) MG tablet Take 325 mg by mouth daily with breakfast.    . fexofenadine (ALLEGRA) 30 MG tablet Take 1 tablet (30 mg total) by mouth 2 (two) times daily.  . Guaifenesin-Codeine 300-10 MG/5ML LIQD Take 1 teaspoon by mouth four times a day as needed   . HYDROcodone-acetaminophen (VICODIN) 5-500 MG per tablet Take 1 tablet by mouth every 8 (eight) hours as needed for pain.  . hydrOXYzine (ATARAX) 25 MG  tablet Take 25 mg by mouth every 8 (eight) hours as needed.    Marland Kitchen levothyroxine (LEVOTHROID) 50 MCG tablet Take 50 mcg by mouth daily.    Marland Kitchen lisinopril-hydrochlorothiazide (PRINZIDE,ZESTORETIC) 20-25 MG per tablet Take 1 tablet by mouth daily.  . metoprolol tartrate (LOPRESSOR) 25 MG tablet Take 1 tablet (25 mg total) by mouth 2 (two) times daily.  Marland Kitchen PROAIR HFA 108 (90 BASE) MCG/ACT inhaler Inhale 1 puff into the lungs Every 4 hours as needed.  . Amlodipine 10 MG tablet Take 1 tablet by mouth daily.    Allergies Amlodipine and Naproxen sodium   Past Medical History  Diagnosis Date  . Primary Sjogren's syndrome     anti Ro+;ANA>1:1280 in homogen pattern, negative ds DNA/RF/anti Smith/RNP/C3-4 comp/la/jo1/Scleroderma/centromere, neg HIV/ACE/Hep B/C, nl CXR 2/05, schirmer salivary glandtest not done, symptom rx:eye drops/prednisone/plaquenil referral to [facility] rheum, [name] 3/07. saw [name] but stopped 2/2 cost.  . Hypertension     no LVH EKG-7/05, nl M/C ratio 4/07  . Morbid obesity   . Back pain     chronic low back pain L4-5 discectomy:11/99. degenerative thoracic spondylotic changes 4/07  . Endometrial hyperplasia     >7 mm.GYN referral  . Obstructive sleep apnea     severe 7/05 RD 161 per hr. /CPAP 18cwp  . Hypothyroidism     09/1998  . Microcytic anemia     09/1998. Hb-10.5/MCV 76. needs ferritin to determine ACD vs IDA  . Menorrhagia   . Uterine bleeding   . Lower extremity edema     echo EF  55-65% w/o evidence of Dias dysfx.,   . Polyarthralgia     (knee/back/ankle) w/dx of fibromyalgia? given by [facility] rheum, knee pain chronic 2/2 obesity, fibromyalgia and Sjogren's.  . Chest pain     neg adenosine myoview.    Past Surgical History  Procedure Date  . Tonsillectomy        Objective:   Physical Exam General: Vital signs reviewed and noted. Well-developed, well-nourished, in no acute distress; alert, appropriate and cooperative throughout examination.    Head: Normocephalic, atraumatic.  Neck: No deformities, masses, or tenderness noted.  Lungs:  Normal respiratory effort. Clear to auscultation BL without crackles or wheezes.  Heart: RRR. S1 and S2 normal without gallop, murmur, or rubs.  Abdomen:  BS normoactive. Soft, Nondistended, non-tender.  No masses or organomegaly. Obese.  Extremities: 1+ BL pretibial pitting edema.Nonpitting edema over dorsal aspect of BL feet with mild warmth and erythema of skin, without induration, lesions. Negative Homan sign BL.          Assessment & Plan:  Case and plan of care discussed with Dr. Blanch Media.

## 2010-10-13 NOTE — Assessment & Plan Note (Addendum)
Patient's presenting symptoms may be multifactorial in the setting of her morbid obesity, with significant weight gain even since June 2012, may be exacerbated with her amlodipine. Presenting symptoms are bilateral, without asymmetry of the edema, negative Homan, and patient remains without fevers. Therefore DVT very unlikely, as well as contribution of Sjogren's is likely minimal as well. - Will recommend compression stockings to be worn throughout the day, take off at bedtime. - Will discontinue amlodipine, and adjust hypertensive medications as indicated in the hypertension problem list. - Will reassess in 4 weeks. - Will recommend the patient to keep legs elevated above heart as much as possible - Will recommend for the patient to present to the ER if having shortness of breath, worsening leg pain, warmth or erythema of skin, asymmetry of edema.

## 2010-12-08 LAB — URINALYSIS, ROUTINE W REFLEX MICROSCOPIC
Bilirubin Urine: NEGATIVE
Glucose, UA: NEGATIVE
Hgb urine dipstick: NEGATIVE
Ketones, ur: NEGATIVE
Nitrite: NEGATIVE
Protein, ur: NEGATIVE
Specific Gravity, Urine: 1.02
Urobilinogen, UA: 1
pH: 7

## 2010-12-08 LAB — APTT: aPTT: 30

## 2010-12-08 LAB — COMPREHENSIVE METABOLIC PANEL WITH GFR
ALT: 11
AST: 17
Albumin: 3.6
Alkaline Phosphatase: 46
BUN: 7
CO2: 29
Calcium: 9.2
Chloride: 101
Creatinine, Ser: 0.82
GFR calc non Af Amer: >60
Glucose, Bld: 105 — ABNORMAL HIGH
Potassium: 3.8
Sodium: 138
Total Bilirubin: 0.9
Total Protein: 7.3

## 2010-12-08 LAB — GLUCOSE, CAPILLARY: Glucose-Capillary: 133 — ABNORMAL HIGH

## 2010-12-08 LAB — CBC
HCT: 33.9 — ABNORMAL LOW
Hemoglobin: 10.9 — ABNORMAL LOW
MCHC: 32.3
MCV: 75.7 — ABNORMAL LOW
Platelets: 296
RBC: 4.47
RDW: 15
WBC: 4.5

## 2010-12-08 LAB — PROTIME-INR
INR: 0.9
Prothrombin Time: 12.7

## 2011-02-11 ENCOUNTER — Other Ambulatory Visit: Payer: Self-pay | Admitting: *Deleted

## 2011-02-11 NOTE — Telephone Encounter (Signed)
Pt has an appt 04/13/11 w/Dr. Eben Burow.

## 2011-02-12 MED ORDER — HYDROCODONE-ACETAMINOPHEN 5-500 MG PO TABS: 1.0000 | ORAL_TABLET | Freq: Three times a day (TID) | ORAL | Status: DC | PRN

## 2011-02-12 NOTE — Telephone Encounter (Signed)
Vicodin rx called to [facility].

## 2011-04-13 ENCOUNTER — Encounter: Payer: Self-pay | Admitting: Internal Medicine

## 2011-04-13 ENCOUNTER — Ambulatory Visit (INDEPENDENT_AMBULATORY_CARE_PROVIDER_SITE_OTHER): Payer: Medicare Other | Admitting: Internal Medicine

## 2011-04-13 VITALS — BP 186/89 | HR 102 | Temp 97.5°F | Wt 294.2 lb

## 2011-04-13 DIAGNOSIS — Z1211 Encounter for screening for malignant neoplasm of colon: Secondary | ICD-10-CM

## 2011-04-13 DIAGNOSIS — R600 Localized edema: Secondary | ICD-10-CM

## 2011-04-13 DIAGNOSIS — I10 Essential (primary) hypertension: Secondary | ICD-10-CM

## 2011-04-13 DIAGNOSIS — E039 Hypothyroidism, unspecified: Secondary | ICD-10-CM

## 2011-04-13 DIAGNOSIS — D509 Iron deficiency anemia, unspecified: Secondary | ICD-10-CM

## 2011-04-13 DIAGNOSIS — R350 Frequency of micturition: Secondary | ICD-10-CM

## 2011-04-13 DIAGNOSIS — R609 Edema, unspecified: Secondary | ICD-10-CM

## 2011-04-13 DIAGNOSIS — J029 Acute pharyngitis, unspecified: Secondary | ICD-10-CM

## 2011-04-13 DIAGNOSIS — B9789 Other viral agents as the cause of diseases classified elsewhere: Secondary | ICD-10-CM

## 2011-04-13 DIAGNOSIS — J028 Acute pharyngitis due to other specified organisms: Secondary | ICD-10-CM

## 2011-04-13 MED ORDER — METOPROLOL TARTRATE 50 MG PO TABS: 50.0000 mg | ORAL_TABLET | Freq: Two times a day (BID) | ORAL | Status: DC

## 2011-04-13 MED ORDER — LEVOTHYROXINE SODIUM 50 MCG PO TABS: 50.0000 ug | ORAL_TABLET | Freq: Every day | ORAL | Status: DC

## 2011-04-13 MED ORDER — CHLORTHALIDONE 25 MG PO TABS: 25.0000 mg | ORAL_TABLET | Freq: Every day | ORAL | Status: DC

## 2011-04-13 MED ORDER — HYDROCODONE-ACETAMINOPHEN 5-500 MG PO TABS: 1.0000 | ORAL_TABLET | Freq: Three times a day (TID) | ORAL | Status: DC | PRN

## 2011-04-13 NOTE — Progress Notes (Signed)
  Subjective:    Patient ID: Nancy [name], female    DOB: 07-05-1955, 56 y.o.   MRN: [id number]  HPI Patient is a 56 year old female patient of mine who is here today for routine physical exam in addition to some acute complaints.  She has not been seen in the clinic since last year.  She complains of swelling in both legs which has increased in last 2-3 weeks. Not able to lie flat. Short of breath while walking. Patient denies any chest pain. Her blood pressure is high at 186/89 today. She has not taken any of her medications and last 4-6 months. She states that taking medications made her feel worse and she would just take Vicodin and aspirin. She wants to get back on all her medications.  Patient has sore throat, difficulty breathing and body aches. Fever 2 days ago upto 101.0 F Has been getting up upto 4 times a night to urinate but denies any burning. She wants to get checked out for diabetes as she has also gained a lot of weight.  She denies flu shot today.  She denies screening colonoscopy at this time.     Review of Systems  Constitutional: Positive for fever. Negative for activity change and appetite change.  HENT: Negative for sore throat.   Respiratory: Positive for shortness of breath. Negative for cough and wheezing.   Cardiovascular: Positive for leg swelling. Negative for chest pain and palpitations.  Gastrointestinal: Negative for nausea, abdominal pain, diarrhea, constipation and abdominal distention.  Genitourinary: Positive for frequency and difficulty urinating. Negative for hematuria.  Musculoskeletal: Positive for arthralgias.  Neurological: Negative for dizziness and headaches.  Psychiatric/Behavioral: Negative for suicidal ideas and behavioral problems.       Objective:   Physical Exam  Constitutional: She is oriented to person, place, and time. She appears well-developed and well-nourished.  HENT:  Head: Normocephalic and atraumatic.  Eyes: Conjunctivae  and EOM are normal. Pupils are equal, round, and reactive to light. No scleral icterus.  Neck: Normal range of motion. Neck supple. No JVD present. No thyromegaly present.  Cardiovascular: Regular rhythm, normal heart sounds and intact distal pulses.  Exam reveals no gallop and no friction rub.   No murmur heard.      Tachycardic  Pulmonary/Chest: Effort normal and breath sounds normal. No respiratory distress. She has no wheezes. She has no rales.  Abdominal: Soft. Bowel sounds are normal. She exhibits no distension and no mass. There is no tenderness. There is no rebound and no guarding.  Musculoskeletal: Normal range of motion. She exhibits edema (2+ pitting edema bilaterally up to knees). She exhibits no tenderness.  Lymphadenopathy:    She has no cervical adenopathy.  Neurological: She is alert and oriented to person, place, and time.  Psychiatric: She has a normal mood and affect. Her behavior is normal.          Assessment & Plan:

## 2011-04-13 NOTE — Assessment & Plan Note (Signed)
This is most likely multifactorial with hypothyroidism and dependent edema contributing. I will restart her hypertensive meds and thyroid meds at this time. Have added chlorthalidone which should help with fluid overload. I do not see an indication for a 2-D echo at this time although she is likely to have diastolic congestive heart failure based on her presentation.

## 2011-04-13 NOTE — Assessment & Plan Note (Signed)
She hasn't had a hemoglobin checked in about 10 months now. I will recheck hemoglobin at this time.

## 2011-04-13 NOTE — Assessment & Plan Note (Signed)
At least restart her Synthroid. Check her TSH in about 2 months time.

## 2011-04-13 NOTE — Patient Instructions (Addendum)
1500 Calorie Diabetic Diet The 1500 calorie diabetic diet limits calories to 1500 each day. Following this diet and making healthy meal choices can help improve overall health. It controls blood glucose (sugar) levels and can also help lower blood pressure and cholesterol.  SERVING SIZES Measuring foods and serving sizes helps to make sure you are getting the right amount of food. The list below tells how big or small some common serving sizes are.  1 oz.........4 stacked dice.   3 oz........Marland KitchenDeck of cards.   1 tsp.......Marland KitchenTip of little finger.   1 tbs......Marland KitchenMarland KitchenThumb.   2 tbs.......Marland KitchenGolf ball.    cup......Marland KitchenHalf of a fist.   1 cup.......Marland KitchenA fist.  GUIDELINES FOR CHOOSING FOODS The goal of this diet is to eat a variety of foods and limit calories to 1500 each day. This can be done by choosing foods that are low in calories and fat. The diet also suggests eating small amounts of food frequently. Doing this helps control your blood glucose levels, so they do not get too high or too low. Each meal or snack may include a protein food source to help you feel more satisfied. Try to eat about the same amount of food around the same time each day. This includes weekend days, travel days, and days off work. Space your meals about 4 to 5 hours apart, and add a snack between them, if you wish.  For example, a daily food plan could include breakfast, a morning snack, lunch, dinner, and an evening snack. Healthy meals and snacks have different types of foods, including whole grains, vegetables, fruits, lean meats, poultry, fish, and dairy products. As you plan your meals, select a variety of foods. Choose from the bread and starch, vegetable, fruit, dairy, and meat/protein groups. Examples of foods from each group are listed below, with their suggested serving sizes. Use measuring cups and spoons to become familiar with what a healthy portion looks like. Bread and Starch Each serving equals 15 grams of  carbohydrate.  1 slice bread.    bagel.    cup cold cereal (unsweetened).    cup hot cereal or mashed potatoes.   1 small potato (size of a computer mouse).   ? cup cooked pasta or rice.    English muffin.   1 cup broth-based soup.   3 cups of popcorn.   4 to 6 whole-wheat crackers.    cup cooked beans, peas, or corn.  Vegetables Each serving equals 5 grams of carbohydrate.   cup cooked vegetables.   1 cup raw vegetables.    cup tomato or vegetable juice.  Fruit Each serving equals 15 grams of carbohydrate.  1 small apple or orange.   1  cup watermelon or strawberries.    cup applesauce (no sugar added).   2 tbs raisins.    banana.    cup canned fruit, packed in water or in its own juice.    cup unsweetened fruit juice.  Dairy Each serving equals 12 to 15 grams of carbohydrate.  1 cup fat-free milk.   6 oz artificially sweetened yogurt or plain yogurt.   1 cup low-fat buttermilk.   1 cup soy milk.   1 cup almond milk.  Meat/Protein  1 large egg.   2 to 3 oz meat, poultry, or fish.    cup low-fat cottage cheese.   1 tbs peanut butter.   1 oz low-fat cheese.    cup tuna, packed in water.    cup tofu.  Fat  1 tsp oil.   1 tsp trans-fat-free margarine.   1 tsp butter.   1 tsp mayonnaise.   2 tbs avocado.   1 tbs salad dressing.   1 tbs cream cheese.   2 tbs sour cream.  SAMPLE 1500 CALORIE DIET PLAN Breakfast   whole-wheat English muffin (1 carb serving).   1 tsp trans-fat-free margarine.   1 scrambled egg.   1 cup fat-free milk (1 carb serving).   1 small orange (1 carb serving).  Lunch  Chicken wrap.   1 whole-wheat tortilla, 8-inch (1 carb servings).   2 oz chicken breast, sliced.   2 tbs low-fat salad dressing, such as [place].    cup shredded lettuce.   2 slices tomato.    cup carrot sticks.   1 small apple (1 carb serving).  Afternoon Snack  3 graham cracker squares (1 carb  serving).   1 tbs peanut butter.  Dinner  2 oz lean pork chop, broiled.   1 cup brown rice (3 carb servings).    cup steamed carrots.    cup green beans.   1 cup fat-free milk (1 carb serving).   1 tsp trans-fat-free margarine.  Evening Snack   cup low-fat cottage cheese.   1 small peach or pear, sliced (or  cup canned in water) (1 carb serving).  MEAL PLAN You can use this worksheet to help you make a daily meal plan based on the 1500 calorie diabetic diet suggestions. If you are using this plan to help you control your blood glucose, you may interchange carbohydrate containing foods (dairy, starches, and fruits). Select a variety of fresh foods of varying colors and flavors. The total amount of carbohydrate in your meals or snacks is more important than making sure you include all of the food groups every time you eat. You can choose from approximately this many of the following foods to build your day's meals:  6 Starches.   3 Vegetables.   2 Fruits.   2 Dairy.   4 to 6 oz Meat/Protein.   Up to 3 Fats.  Your dietician can use this worksheet to help you decide how many servings and which types of foods are right for you. BREAKFAST Food Group and Servings / Food Choice Starch _________________________________________________________ Dairy __________________________________________________________ Fruit ___________________________________________________________ Meat/Protein____________________________________________________ Fat ____________________________________________________________ LUNCH Food Group and Servings / Food Choice  Starch _________________________________________________________ Meat/Protein ___________________________________________________ Vegetables _____________________________________________________ Fruit __________________________________________________________ Dairy __________________________________________________________ Fat  ____________________________________________________________ Aura Fey Food Group and Servings / Food Choice Dairy __________________________________________________________ Starch _________________________________________________________ Meat/Protein____________________________________________________ Zada Girt ___________________________________________________________ Laural Golden Food Group and Servings / Food Choice Starch _________________________________________________________ Meat/Protein ___________________________________________________ Dairy __________________________________________________________ Vegetable ______________________________________________________ Fruit ___________________________________________________________ Fat ____________________________________________________________ Lollie Sails Food Group and Servings / Food Choice Fruit ___________________________________________________________ Meat/Protein ____________________________________________________ Dairy __________________________________________________________ Starch __________________________________________________________ DAILY TOTALS Starches _________________________ Vegetables _______________________ Fruits ____________________________ Dairy ____________________________ Meat/Protein_____________________ Fats _____________________________ Document Released: 09/15/2004 Document Revised: 11/05/2010 Document Reviewed: 01/10/2009 [employer] Patient Information 2012 [place], [place].Calorie Counting Diet A calorie counting diet requires you to eat the number of calories that are right for you in a day. Calories are the measurement of how much energy you get from the food you eat. Eating the right amount of calories is important for staying at a healthy weight. If you eat too many calories, your body will store them as fat and you may gain weight. If you eat too few calories, you may lose weight. Counting  the number of calories you eat during a day will help you know if you are eating  the right amount. A Registered Dietitian can determine how many calories you need in a day. The amount of calories needed varies from person to person. If your goal is to lose weight, you will need to eat fewer calories. Losing weight can benefit you if you are overweight or have health problems such as heart disease, high blood pressure, or diabetes. If your goal is to gain weight, you will need to eat more calories. Gaining weight may be necessary if you have a certain health problem that causes your body to need more energy. TIPS Whether you are increasing or decreasing the number of calories you eat during a day, it may be hard to get used to changes in what you eat and drink. The following are tips to help you keep track of the number of calories you eat.  Measure foods at home with measuring cups. This helps you know the amount of food and number of calories you are eating.   Restaurants often serve food in amounts that are larger than 1 serving. While eating out, estimate how many servings of a food you are given. For example, a serving of cooked rice is  cup or about the size of half of a fist. Knowing serving sizes will help you be aware of how much food you are eating at restaurants.   Ask for smaller portion sizes or child-size portions at restaurants.   Plan to eat half of a meal at a restaurant. Take the rest home or share the other half with a friend.   Read the Nutrition Facts panel on food labels for calorie content and serving size. You can find out how many servings are in a package, the size of a serving, and the number of calories each serving has.   For example, a package might contain 3 cookies. The Nutrition Facts panel on that package says that 1 serving is 1 cookie. Below that, it will say there are 3 servings in the container. The calories section of the Nutrition Facts label says there are 90  calories. This means there are 90 calories in 1 cookie (1 serving). If you eat 1 cookie you have eaten 90 calories. If you eat all 3 cookies, you have eaten 270 calories (3 servings x 90 calories = 270 calories).  The list below tells you how big or small some common portion sizes are.  1 oz.........4 stacked dice.   3 oz........Marland KitchenDeck of cards.   1 tsp.......Marland KitchenTip of little finger.   1 tbs......Marland KitchenMarland KitchenThumb.   2 tbs.......Marland KitchenGolf ball.    cup......Marland KitchenHalf of a fist.   1 cup.......Marland KitchenA fist.  KEEP A FOOD LOG Write down every food item you eat, the amount you eat, and the number of calories in each food you eat during the day. At the end of the day, you can add up the total number of calories you have eaten. It may help to keep a list like the one below. Find out the calorie information by reading the Nutrition Facts panel on food labels. Breakfast  Bran cereal (1 cup, 110 calories).   Fat-free milk ( cup, 45 calories).  Snack  Apple (1 medium, 80 calories).  Lunch  Spinach (1 cup, 20 calories).   Tomato ( medium, 20 calories).   Chicken breast strips (3 oz, 165 calories).   Shredded cheddar cheese ( cup, 110 calories).   Light [place] dressing (2 tbs, 60 calories).   Whole-wheat bread (1 slice, 80 calories).   Tub margarine (1  tsp, 35 calories).   Vegetable soup (1 cup, 160 calories).  Dinner  Pork chop (3 oz, 190 calories).   Brown rice (1 cup, 215 calories).   Steamed broccoli ( cup, 20 calories).   Strawberries (1  cup, 65 calories).   Whipped cream (1 tbs, 50 calories).  Daily Calorie Total: 1425 Document Released: 02/23/2005 Document Revised: 11/05/2010 Document Reviewed: 08/20/2006 [facility] Patient Information 2012 [place], [place].   Buy otc cough and congestion suppressent from [facility]. Drink a lot of fluids.

## 2011-04-13 NOTE — Assessment & Plan Note (Signed)
This is most likely viral in origin given the fever and body aches. The treatment should be conservative at this time as patient has been sick for about 4-5 days. There is no indication to use Tamiflu. I encouraged her to drink plenty of fluids. Told her about over-the-counter cough and congestion relief syrup.

## 2011-04-13 NOTE — Assessment & Plan Note (Signed)
I would start her on chlorthalidone and her home medication metoprolol at this time. I would obtain a basic metabolic profile today. Have her come back in 2 weeks for a repeat basic metabolic profile.

## 2011-04-14 LAB — COMPLETE METABOLIC PANEL WITHOUT GFR
ALT: 8 U/L (ref 0–35)
AST: 14 U/L (ref 0–37)
Albumin: 3.7 g/dL (ref 3.5–5.2)
Alkaline Phosphatase: 59 U/L (ref 39–117)
BUN: 11 mg/dL (ref 6–23)
CO2: 31 meq/L (ref 19–32)
Calcium: 8.4 mg/dL (ref 8.4–10.5)
Chloride: 101 meq/L (ref 96–112)
Creat: 0.84 mg/dL (ref 0.50–1.10)
GFR, Est African American: 89 mL/min
GFR, Est Non African American: 77 mL/min
Glucose, Bld: 138 mg/dL — ABNORMAL HIGH (ref 70–99)
Potassium: 3.9 meq/L (ref 3.5–5.3)
Sodium: 138 meq/L (ref 135–145)
Total Bilirubin: 0.5 mg/dL (ref 0.3–1.2)
Total Protein: 7.3 g/dL (ref 6.0–8.3)

## 2011-04-14 LAB — CBC
HCT: 34.4 % — ABNORMAL LOW (ref 36.0–46.0)
Hemoglobin: 10 g/dL — ABNORMAL LOW (ref 12.0–15.0)
MCH: 22.4 pg — ABNORMAL LOW (ref 26.0–34.0)
MCHC: 29.1 g/dL — ABNORMAL LOW (ref 30.0–36.0)
MCV: 77.1 fL — ABNORMAL LOW (ref 78.0–100.0)
Platelets: 128 10*3/uL — ABNORMAL LOW (ref 150–400)
RBC: 4.46 MIL/uL (ref 3.87–5.11)
RDW: 15.6 % — ABNORMAL HIGH (ref 11.5–15.5)
WBC: 5.5 10*3/uL (ref 4.0–10.5)

## 2011-04-14 LAB — HEMOGLOBIN A1C
Hgb A1c MFr Bld: 6.2 % — ABNORMAL HIGH
Mean Plasma Glucose: 131 mg/dL — ABNORMAL HIGH

## 2011-06-01 IMAGING — CR DG CHEST 2V
2 series · 2 of 2 positions shown · non-contrast
Comparison: 01/02/2008

CLINICAL DATA: Weakness, shortness of breath and central chest pain
for 4 days.Question pneumonia.

CHEST - 2 VIEW

[w chest pa]
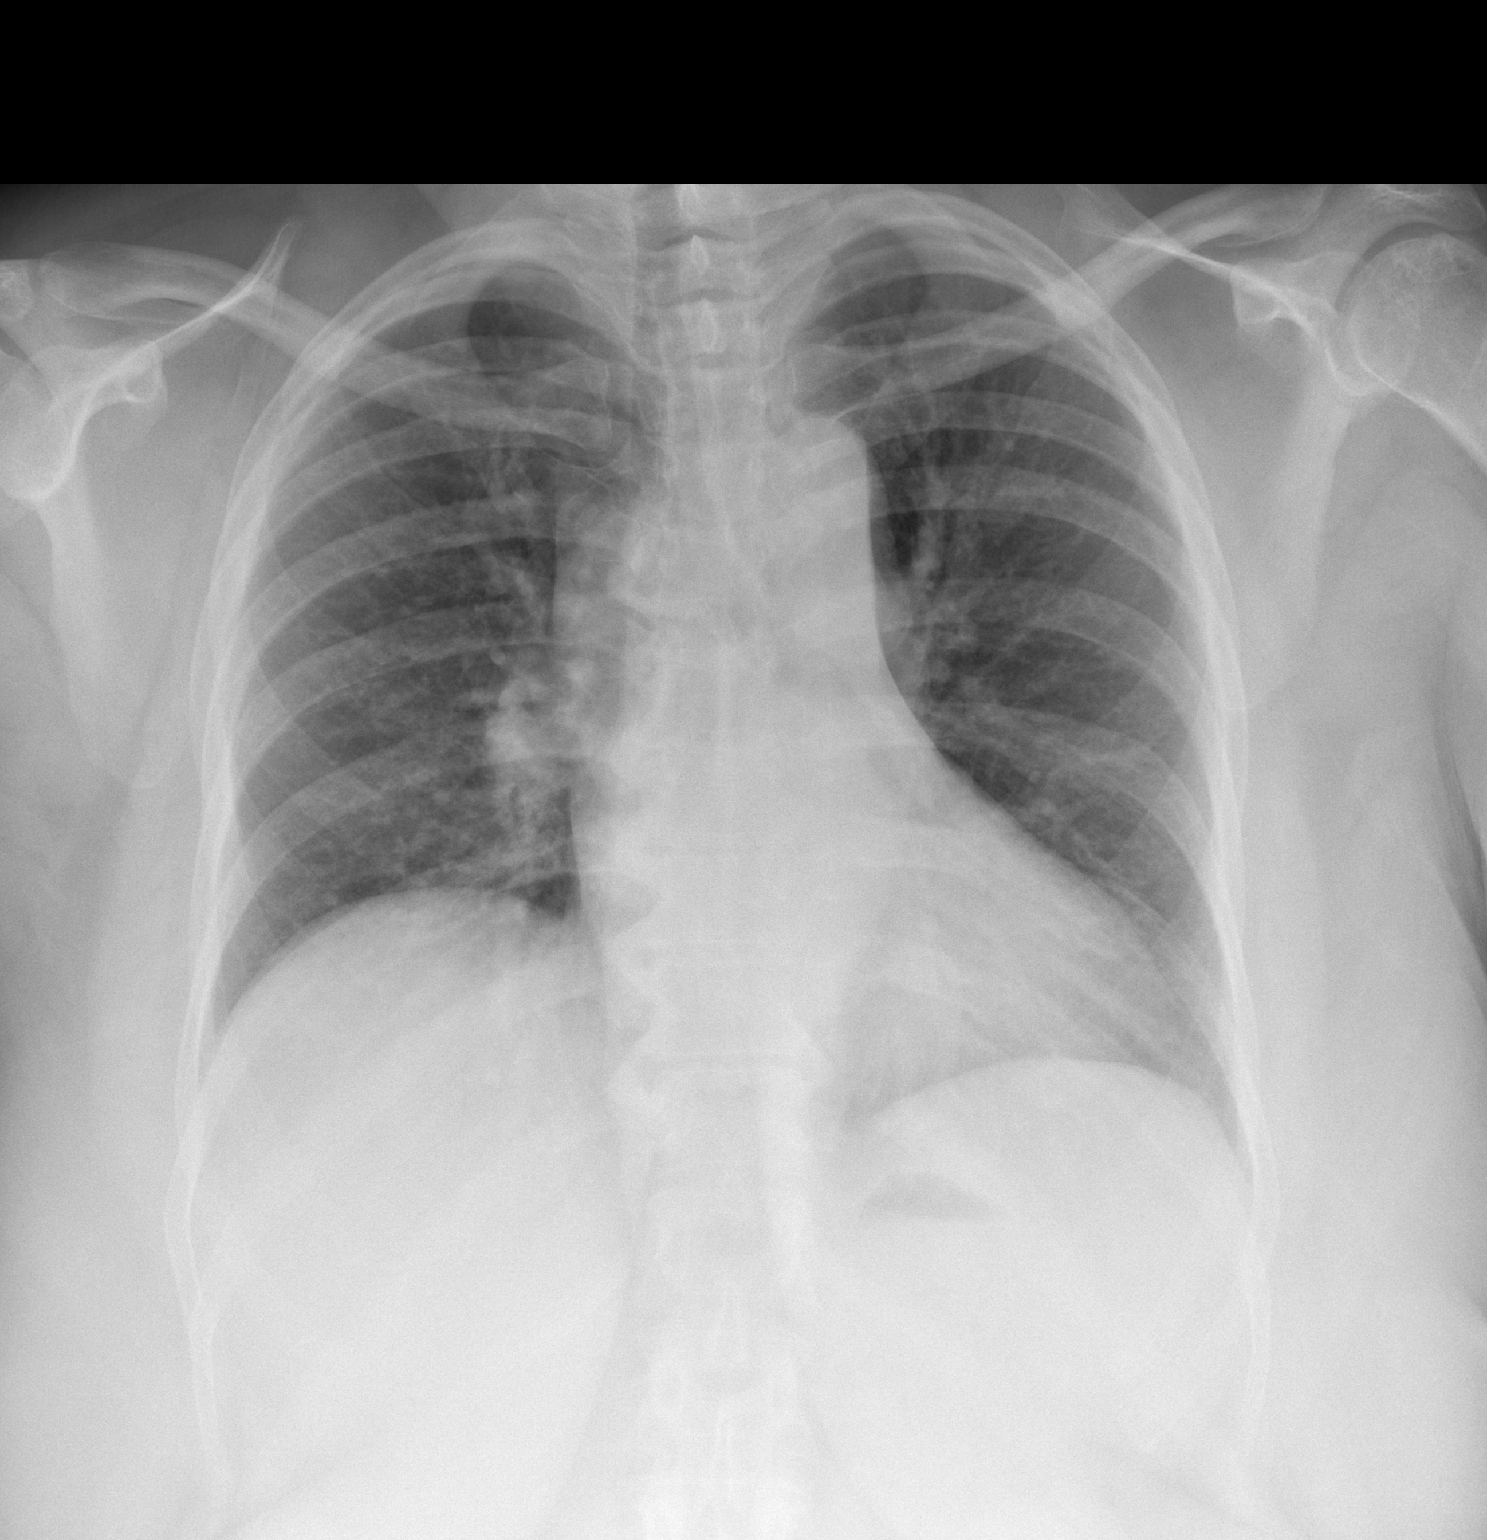

[w chest lat]
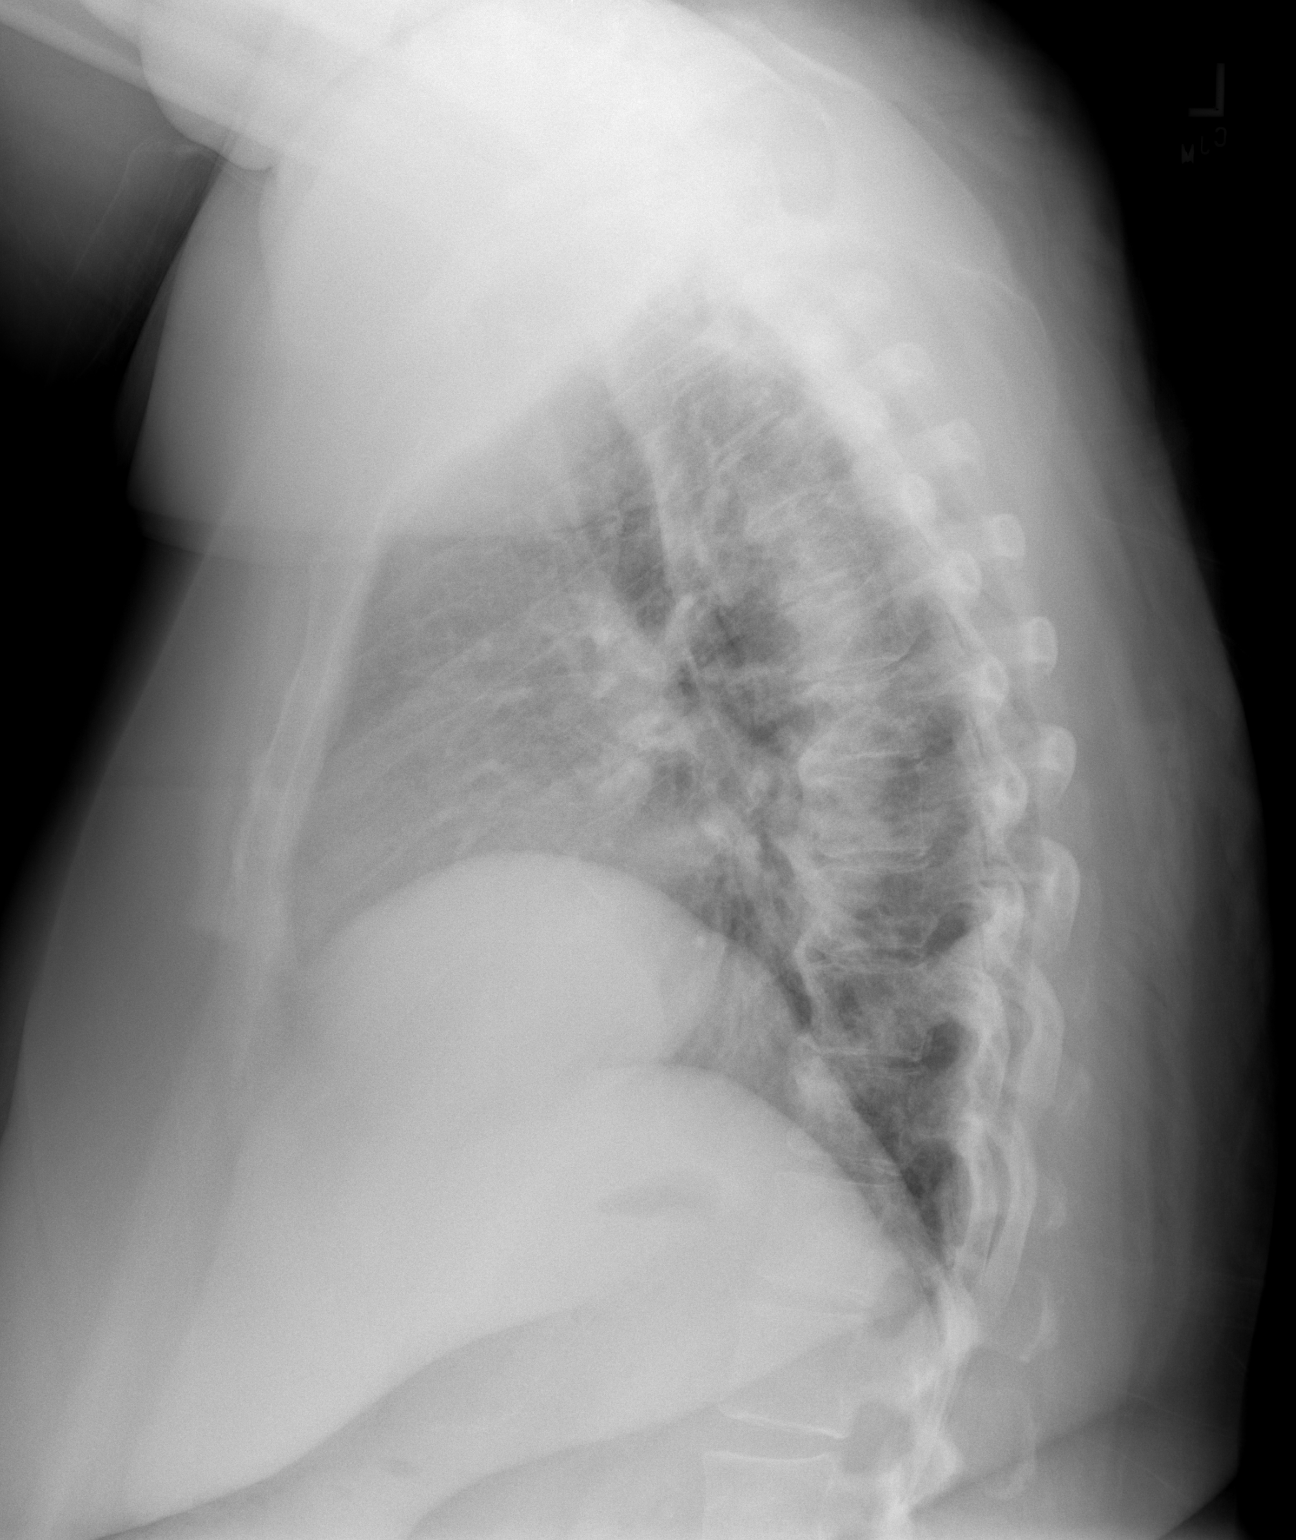

[2 of 2 positions shown; findings below may reference images not displayed]

FINDINGS: The heart size and mediastinal contours are stable.
There is stable elevation of the right hemidiaphragm with
associated mild right basilar atelectasis.  There is mild central
airway thickening.  No confluent airspace opacity, edema or pleural
effusion is present.  There are no acute osseous findings.
IMPRESSION: Stable examination with chronic right basilar atelectasis and
central airway thickening.  No evidence of pneumonia.

## 2011-06-15 ENCOUNTER — Ambulatory Visit: Payer: Medicare Other | Admitting: Internal Medicine

## 2011-06-23 ENCOUNTER — Ambulatory Visit: Payer: Medicare Other | Admitting: Internal Medicine

## 2011-07-21 ENCOUNTER — Encounter: Payer: Self-pay | Admitting: Internal Medicine

## 2011-07-21 ENCOUNTER — Ambulatory Visit (INDEPENDENT_AMBULATORY_CARE_PROVIDER_SITE_OTHER): Payer: Medicare Other | Admitting: Internal Medicine

## 2011-07-21 VITALS — BP 158/98 | HR 70 | Temp 98.8°F | Ht 64.0 in | Wt 294.5 lb

## 2011-07-21 DIAGNOSIS — J302 Other seasonal allergic rhinitis: Secondary | ICD-10-CM

## 2011-07-21 DIAGNOSIS — J309 Allergic rhinitis, unspecified: Secondary | ICD-10-CM

## 2011-07-21 MED ORDER — ALBUTEROL SULFATE HFA 108 (90 BASE) MCG/ACT IN AERS: 1.0000 | INHALATION_SPRAY | Freq: Four times a day (QID) | RESPIRATORY_TRACT | Status: DC | PRN

## 2011-07-21 MED ORDER — PSEUDOEPHEDRINE HCL 60 MG PO TABS: 60.0000 mg | ORAL_TABLET | Freq: Four times a day (QID) | ORAL | Status: AC | PRN

## 2011-07-21 MED ORDER — FLUTICASONE PROPIONATE 50 MCG/ACT NA SUSP: 2.0000 | Freq: Every day | NASAL | Status: DC

## 2011-07-21 MED ORDER — DIPHENHYDRAMINE HCL 25 MG PO CAPS: 25.0000 mg | ORAL_CAPSULE | ORAL | Status: DC | PRN

## 2011-07-21 NOTE — Progress Notes (Signed)
  Subjective:   Patient ID: Nancy [name] female   DOB: 1955/08/26 56 y.o.   MRN: [id number]  HPI: [name] is a 56 y.o. woman with past medical history significant for hypertension, morbid obesity, chronic back pain, obstructive sleep apnea, hypothyroidism, and Sjgren syndrome who presents today for URI type symptoms. She complains of green sputum producing cough, sore throat, shortness of breath for about 1.5 week.  She presented with a viral URI type symptom in February 2013. That she also presented with similar symptoms in May of last year. She has not been febrile. She reports that her grandchildren have been sick. She feels that her symptoms are worsening. Symptoms also include feeling like her eyes are on fire and runny nose started this morning. She has bilateral frontal, throbbing headache rated as 11 out of 10. She reports associated muscle achiness with sore ribs. She cannot sleep, as her shortness of breath is worse in at night. She has tried aspirin, with minimal relief.  She is able to tolerate fluids.   Review of Systems: Constitutional: Denies diaphoresis, appetite change HEENT: Denies photophobia, eye pain, redness, hearing loss, ear pain, mouth sores, trouble swallowing, and tinnitus.  Respiratory: As per HPI  Cardiovascular: Denies palpitations; chronic b/l LE leg swelling.  Gastrointestinal: Denies nausea, vomiting, abdominal pain, constipation, blood in stool and abdominal distention. 2 loose BM on the day prior Genitourinary: Denies dysuria, urgency, frequency, hematuria, flank pain and difficulty urinating.  Musculoskeletal: Chronic back pain Skin: Denies pallor, rash and wound.  Neurological: Denies dizziness, seizures, syncope, light-headedness, numbness  Objective:  Physical Exam: Filed Vitals:   07/21/11 1009  BP: 158/98  Pulse: 70  Temp: 98.8 F (37.1 C)  TempSrc: Oral  Height: 5\' 4"  (1.626 m)  Weight: 294 lb 8 oz (133.584 kg)  SpO2: 97%    Constitutional: Vital signs reviewed.  Patient is an obese woman in mild distress and cooperative with exam.  Ear: TM normal bilaterally Mouth: no erythema or exudates, MMM Eyes: PERRL, EOMI, conjunctivae normal, No scleral icterus.  Cardiovascular: RRR, S1 normal, S2 normal, no MRG, pulses symmetric and intact bilaterally Pulmonary/Chest: CTAB, no wheezes, rales, or rhonchi Abdominal: Soft. Non-tender, non-distended, bowel sounds are normal Neurological: A&O x3, Strength is 4+/5 and symmetric bilaterally, cranial nerve II-XII are grossly intact, no focal motor deficit, sensory intact to light touch bilaterally.  Skin: Warm, dry and intact. No rash, cyanosis, or clubbing.    Assessment & Plan:   Case and care discussed with Dr. Josem Kaufmann. Patient return in one month to followup with her primary care doctor regarding her high blood pressure and hypothyroidism. She should return to the clinic sooner if she does not improved. Please see problem oriented charting for further details.

## 2011-07-21 NOTE — Patient Instructions (Signed)
-  Please start taking flonase (2 sprays) daily  -Please take benadryl as needed to help you sleep, this medication will also help with your congestion.  -Please take Sudafed as needed to help with congestion, I think this will help with your headache as well  -You may also refill your albuterol inhaler to help with shortness of breath  -If you do not feel any better in 1 week, please call the clinic  Please be sure to bring all of your medications with you to every visit.  Should you have any new or worsening symptoms, please be sure to call the clinic at [phone].

## 2011-07-22 NOTE — Assessment & Plan Note (Signed)
I suspect that patient's symptoms are due to seasonal allergies since she had a similar presentation last May. I do not see an indication for antibiotics at this time.  -Flonase daily -Sudafed during acute flare to manage congestion (aware that the patient is hypertensive, but I believe that the benefit outweighs the risk in the acute setting) -Benadryl as needed for congestion and insomnia during acute exacerbation -Benadryl to help with shortness of breath in setting of likely hyperreactive airways given history of bronchitis

## 2011-07-23 ENCOUNTER — Other Ambulatory Visit: Payer: Self-pay | Admitting: *Deleted

## 2011-07-24 MED ORDER — HYDROCODONE-ACETAMINOPHEN 5-500 MG PO TABS: 1.0000 | ORAL_TABLET | Freq: Three times a day (TID) | ORAL | Status: DC | PRN

## 2011-07-24 NOTE — Telephone Encounter (Signed)
Vicodin rx called to [facility]. 

## 2011-07-24 NOTE — Telephone Encounter (Signed)
Rx called in to pharmacy. 

## 2011-07-30 ENCOUNTER — Telehealth: Payer: Self-pay | Admitting: *Deleted

## 2011-07-30 NOTE — Telephone Encounter (Signed)
Pt called has had nonproductive cough past 2 weeks. Denies temp Not sleeping. Was seen in clinic recently. Allergic to Aleve and cocconut. [facility]ses [facility]. Pt wants to refill cough syrup about 1 year ago. Stanton Kidney Ahtziry Saathoff RN 07/30/11 11AM

## 2011-07-30 NOTE — Telephone Encounter (Signed)
I do not feel comfortable to refill a prescription cough syrup without clinical evaluation. Please ask her to come in to the clinic if her cough is severe enough that it is not resolved with over the counter cough syrups.

## 2011-08-06 NOTE — Telephone Encounter (Signed)
Tried calling pt on [phone] - phone no longer in service and [phone] - not in service.

## 2011-08-12 ENCOUNTER — Telehealth: Payer: Self-pay | Admitting: *Deleted

## 2011-08-12 NOTE — Telephone Encounter (Signed)
Pt's daughter called in with c/o cough that is keeping pt up at night.  Cough is severe and even hurting rib cage.  Noted this is the second call for Rx strength cough med.  Last call 5/23 - not addressed. Pt given appointment for tomorrow for evaluation.

## 2011-08-13 ENCOUNTER — Encounter: Payer: Self-pay | Admitting: Internal Medicine

## 2011-08-13 ENCOUNTER — Ambulatory Visit (INDEPENDENT_AMBULATORY_CARE_PROVIDER_SITE_OTHER): Payer: Medicare Other | Admitting: Internal Medicine

## 2011-08-13 VITALS — BP 158/86 | HR 63 | Temp 97.6°F | Wt 293.1 lb

## 2011-08-13 DIAGNOSIS — E039 Hypothyroidism, unspecified: Secondary | ICD-10-CM

## 2011-08-13 DIAGNOSIS — R05 Cough: Secondary | ICD-10-CM

## 2011-08-13 DIAGNOSIS — G5602 Carpal tunnel syndrome, left upper limb: Secondary | ICD-10-CM | POA: Insufficient documentation

## 2011-08-13 DIAGNOSIS — D509 Iron deficiency anemia, unspecified: Secondary | ICD-10-CM

## 2011-08-13 DIAGNOSIS — I10 Essential (primary) hypertension: Secondary | ICD-10-CM

## 2011-08-13 DIAGNOSIS — M549 Dorsalgia, unspecified: Secondary | ICD-10-CM

## 2011-08-13 DIAGNOSIS — R059 Cough, unspecified: Secondary | ICD-10-CM

## 2011-08-13 DIAGNOSIS — N950 Postmenopausal bleeding: Secondary | ICD-10-CM

## 2011-08-13 DIAGNOSIS — G5600 Carpal tunnel syndrome, unspecified upper limb: Secondary | ICD-10-CM

## 2011-08-13 DIAGNOSIS — G8929 Other chronic pain: Secondary | ICD-10-CM

## 2011-08-13 DIAGNOSIS — N8500 Endometrial hyperplasia, unspecified: Secondary | ICD-10-CM

## 2011-08-13 LAB — TSH: TSH: 3.317 u[IU]/mL (ref 0.350–4.500)

## 2011-08-13 MED ORDER — FERROUS SULFATE 325 (65 FE) MG PO TABS: 325.0000 mg | ORAL_TABLET | Freq: Three times a day (TID) | ORAL | Status: DC

## 2011-08-13 MED ORDER — GUAIFENESIN-CODEINE 100-10 MG/5ML PO SYRP: 5.0000 mL | ORAL_SOLUTION | ORAL | Status: DC | PRN

## 2011-08-13 NOTE — Assessment & Plan Note (Signed)
Patient is on NSAIDs over-the-counter for control of back pain. She was advised to lose weight. We will sign a pain contract today for 30 pills a month off of Vicodin. This prescription is only for breakthrough pain which is not controlled with NSAIDs. We will also obtain a urinary drug screen today.

## 2011-08-13 NOTE — Assessment & Plan Note (Signed)
Patient has had history of endometrial hyperplasia but has not really followed up with his OB/GYN for biopsy. I will setup an appointment with her OB/GYN. I would also order a transvaginal ultrasound for followup of her endometrial hyperplasia. Have given her instructions and reading material about postmenopausal bleeding. Follow up in 2-3 weeks. Patient refused genitourinary exam.

## 2011-08-13 NOTE — Assessment & Plan Note (Addendum)
The symptoms are classical for carpal tunnel syndrome with Tinel sign positive.  I will prescribe her a wrist splint to be worn throughout the day along with over-the-counter pain medications for symptom relief. Advice to lose weight. We can order nerve conduction studies to confirm the diagnosis. Follow up in 2-3 weeks.

## 2011-08-13 NOTE — Assessment & Plan Note (Signed)
Patient has had cough for about 1-1/2 months now.  Pseudoephedrine and Benadryl did not work been prescribed last month. Patient was advised to avoid smoke which could be an irritant in her case as she does all the cooking at her house. Patient was also prescribed Gaifenesin NR for symptomatic relief which has worked in the past. Follow up in 2-3 weeks and further evaluation with a prolonged course of PPI versus PFT to assess for cough variant of asthma will be warranted.

## 2011-08-13 NOTE — Progress Notes (Signed)
  Subjective:    Patient ID: Nancy [name], female    DOB: [birth year], 56 y.o.   MRN: [id number]  HPI  Patient is a 56 year old female patient of mine who is here today for multiple complaints. Her past medical history is noted in the chart.  Problem 1: Cough: The patient is having cough which has continued for last 1-1/2 months. He should complaints that her chest hurts from excessive coughing. Cough is nonproductive and present throughout the day. Patient does a lot of cooking and is exposed to smoke throughout the day. Patient was seen by Dr. Milbert Coulter last month and was prescribed pseudoephedrine and Benadryl which has not prove to be useful. It was thought that patient likely has exacerbation of her seasonal allergies. Patient had been on guaifenesin codeine syrup in the past which had helped her cough.   #2: Patient complains of bleeding from her vagina every month which started about a couple of months ago. The amount is described as spotting of her underpants and lasts 2-3 days. The bleeding is associated with abdominal cramps. Patient does not exhibit recent weight loss, decrease in appetite, bone pains, difficulty breathing, weakness anywhere in the body, increased abdominal girth.  Problem #3 patient complains of excessive left wrist pain which is worse in the morning. Patient also complains of tingling and numbness on the lateral fingers and thumb. The numbness is worse in the morning so much so that she is unable to move her left hand. Patient also had a fall 3 weeks ago and states that symptoms are worse ever since.  #4 back pain: Patient has been taking Vicodin at night when pain is worse along with over-the-counter pain medications throughout the day to help with back pain.  Please see assessment and plan for problem-oriented charting.    Review of Systems  All other systems reviewed and are negative.       Objective:   Physical Exam  Constitutional: She is oriented to person,  place, and time. She appears well-developed and well-nourished.  HENT:  Head: Normocephalic and atraumatic.  Eyes: Conjunctivae and EOM are normal. Pupils are equal, round, and reactive to light. No scleral icterus.  Neck: Normal range of motion. Neck supple. No JVD present. No thyromegaly present.  Cardiovascular: Normal rate, regular rhythm, normal heart sounds and intact distal pulses.  Exam reveals no gallop and no friction rub.   No murmur heard. Pulmonary/Chest: Effort normal and breath sounds normal. No respiratory distress. She has no wheezes. She has no rales.  Abdominal: Soft. Bowel sounds are normal. She exhibits no distension and no mass. There is no tenderness. There is no rebound and no guarding.  Genitourinary:       Patient refused genitourinary exam at this time  Musculoskeletal: Normal range of motion. She exhibits edema. She exhibits no tenderness.       1+ pitting edema bilaterally up to mid shin  Lymphadenopathy:    She has no cervical adenopathy.  Neurological: She is alert and oriented to person, place, and time.  Psychiatric: She has a normal mood and affect. Her behavior is normal.          Assessment & Plan:

## 2011-08-13 NOTE — Assessment & Plan Note (Signed)
Get thyroid function tests today.

## 2011-08-13 NOTE — Assessment & Plan Note (Signed)
I did not address this problem today as patient is in pain. Review in 2-3 weeks.

## 2011-08-13 NOTE — Patient Instructions (Signed)
Postmenopausal Bleeding Menopause is commonly referred to as the "change in life." It is a time when the fertile years, the time of ovulating and having menstrual periods, has come to an end. It is also determined by not having menstrual periods for 12 months.  Postmenopausal bleeding is any bleeding a woman has after she has entered into menopause. Any type of postmenopausal bleeding, even if it appears to be a typical menstrual period, is concerning. This should be evaluated by your caregiver.  CAUSES   Hormone therapy.   Cancer of the cervix or cancer of the lining of the uterus (endometrial cancer).   Thinning of the uterine lining (uterine atrophy).   Thyroid diseases.   Certain medicines.   Infection of the uterus or cervix.   Inflammation or irritation of the uterine lining (endometritis).   Estrogen-secreting tumors.   Growths (polyps) on the cervix, uterine lining, or uterus.   Uterine tumors (fibroids).   Being very overweight (obese).  DIAGNOSIS  Your caregiver will take a medical history and ask questions. A physical exam will also be performed. Further tests may include:   A transvaginal ultrasound. An ultrasound wand or probe is inserted into your vagina to view the pelvic organs.   A biopsy of the lining of the uterus (endometrium). A sample of the endometrium is removed and examined.   A hysteroscopy. Your caregiver may use an instrument with a light and a camera attached to it (hysteroscope). The hysteroscope is used to look inside the uterus for problems.   A dilation and curettage (D&C). Tissue is removed from the uterine lining to be examined for problems.  TREATMENT  Treatment depends on the cause of the bleeding. Some treatments include:   Surgery.   Medicines.   Hormones.   A hysteroscopy or D&C to remove polyps or fibroids.   Changing or stopping a current medicine you are taking.  Talk to your caregiver about your specific treatment. HOME CARE  INSTRUCTIONS   Maintain a healthy weight.   Keep regular pelvic exams and Pap tests.  SEEK MEDICAL CARE IF:   You have bleeding, even if it is light in comparison to your previous periods.   Your bleeding lasts more than 1 week.   You have abdominal pain.   You develop bleeding with sexual intercourse.  SEEK IMMEDIATE MEDICAL CARE IF:   You have a fever, chills, headache, dizziness, muscle aches, and bleeding.   You have severe pain with bleeding.   You are passing blood clots.   You have bleeding and need more than 1 pad an hour.   You feel faint.  MAKE SURE YOU:  Understand these instructions.   Will watch your condition.   Will get help right away if you are not doing well or get worse.  Document Released: 06/03/2005 Document Revised: 02/12/2011 Document Reviewed: 10/30/2010 [employer] Patient Information 2012 [employer]. 

## 2011-08-13 NOTE — Assessment & Plan Note (Signed)
Patient was given a prescription for iron pills and was advised to start taking them. Followup in 2-3 weeks and repeat CBC in 2-3 months.

## 2011-08-14 ENCOUNTER — Encounter: Payer: Self-pay | Admitting: Obstetrics & Gynecology

## 2011-08-14 LAB — PRESCRIPTION ABUSE MONITORING 15P, URINE
Amphetamine/Meth: NEGATIVE ng/mL
Barbiturate Screen, Urine: NEGATIVE ng/mL
Benzodiazepine Screen, Urine: NEGATIVE ng/mL
Buprenorphine, Urine: NEGATIVE ng/mL
Cannabinoid Scrn, Ur: NEGATIVE ng/mL
Carisoprodol, Urine: NEGATIVE ng/mL
Cocaine Metabolites: NEGATIVE ng/mL
Creatinine, Urine: 107.27 mg/dL
Fentanyl, Ur: NEGATIVE ng/mL
Meperidine, Ur: NEGATIVE ng/mL
Methadone Screen, Urine: NEGATIVE ng/mL
Opiate Screen, Urine: NEGATIVE ng/mL
Oxycodone Screen, Ur: NEGATIVE ng/mL
Propoxyphene: NEGATIVE ng/mL
Tramadol Scrn, Ur: NEGATIVE ng/mL
Zolpidem, Urine: NEGATIVE ng/mL

## 2011-08-17 ENCOUNTER — Telehealth: Payer: Self-pay | Admitting: *Deleted

## 2011-08-17 DIAGNOSIS — N950 Postmenopausal bleeding: Secondary | ICD-10-CM

## 2011-08-17 NOTE — Telephone Encounter (Signed)
Call from radiology stating pt needs to have a pelvic ultrasound complete in addition to the transvaginal ultrasound .  Discussed with DrGarg, he agreed with order. Order placed.Criss Alvine, Aviannah Castoro Cassady6/10/20132:01 PM

## 2011-08-18 ENCOUNTER — Other Ambulatory Visit (HOSPITAL_COMMUNITY): Payer: Medicare Other

## 2011-08-24 ENCOUNTER — Other Ambulatory Visit: Payer: Self-pay | Admitting: Internal Medicine

## 2011-08-24 ENCOUNTER — Other Ambulatory Visit: Payer: Self-pay | Admitting: *Deleted

## 2011-08-24 MED ORDER — FLUTICASONE PROPIONATE 50 MCG/ACT NA SUSP: 1.0000 | Freq: Every day | NASAL | Status: DC

## 2011-08-24 NOTE — Telephone Encounter (Signed)
Needs refill Fluticasone spray - pharmacy request 90 day supply.

## 2011-08-25 ENCOUNTER — Ambulatory Visit (HOSPITAL_COMMUNITY)
Admission: RE | Admit: 2011-08-25 | Discharge: 2011-08-25 | Disposition: A | Discharge status: Home / Self Care | Payer: Medicare Other | Source: Ambulatory Visit | Attending: Internal Medicine | Admitting: Internal Medicine

## 2011-08-25 ENCOUNTER — Other Ambulatory Visit (HOSPITAL_COMMUNITY): Payer: Medicare Other

## 2011-08-25 DIAGNOSIS — N950 Postmenopausal bleeding: Secondary | ICD-10-CM

## 2011-08-26 MED ORDER — FLUTICASONE PROPIONATE 50 MCG/ACT NA SUSP: 1.0000 | Freq: Every day | NASAL | Status: DC

## 2011-08-27 ENCOUNTER — Encounter: Payer: Self-pay | Admitting: Internal Medicine

## 2011-08-27 ENCOUNTER — Ambulatory Visit (HOSPITAL_COMMUNITY)
Admission: RE | Admit: 2011-08-27 | Discharge: 2011-08-27 | Disposition: A | Discharge status: Home / Self Care | Payer: Medicare Other | Source: Ambulatory Visit | Attending: Internal Medicine | Admitting: Internal Medicine

## 2011-08-27 ENCOUNTER — Ambulatory Visit (INDEPENDENT_AMBULATORY_CARE_PROVIDER_SITE_OTHER): Payer: Medicare Other | Admitting: Internal Medicine

## 2011-08-27 VITALS — BP 165/85 | HR 86 | Temp 97.5°F | Ht 64.0 in | Wt 294.5 lb

## 2011-08-27 DIAGNOSIS — M25539 Pain in unspecified wrist: Secondary | ICD-10-CM | POA: Insufficient documentation

## 2011-08-27 DIAGNOSIS — W19XXXA Unspecified fall, initial encounter: Secondary | ICD-10-CM | POA: Insufficient documentation

## 2011-08-27 DIAGNOSIS — R059 Cough, unspecified: Secondary | ICD-10-CM

## 2011-08-27 DIAGNOSIS — M79602 Pain in left arm: Secondary | ICD-10-CM

## 2011-08-27 DIAGNOSIS — G5600 Carpal tunnel syndrome, unspecified upper limb: Secondary | ICD-10-CM

## 2011-08-27 DIAGNOSIS — D509 Iron deficiency anemia, unspecified: Secondary | ICD-10-CM

## 2011-08-27 DIAGNOSIS — G5602 Carpal tunnel syndrome, left upper limb: Secondary | ICD-10-CM

## 2011-08-27 DIAGNOSIS — I10 Essential (primary) hypertension: Secondary | ICD-10-CM

## 2011-08-27 DIAGNOSIS — M79609 Pain in unspecified limb: Secondary | ICD-10-CM | POA: Insufficient documentation

## 2011-08-27 DIAGNOSIS — N8500 Endometrial hyperplasia, unspecified: Secondary | ICD-10-CM

## 2011-08-27 DIAGNOSIS — R05 Cough: Secondary | ICD-10-CM

## 2011-08-27 MED ORDER — AMLODIPINE BESYLATE 10 MG PO TABS: 10.0000 mg | ORAL_TABLET | Freq: Every day | ORAL | Status: DC

## 2011-08-27 NOTE — Assessment & Plan Note (Signed)
Advice to take iron as prescribed.

## 2011-08-27 NOTE — Assessment & Plan Note (Signed)
Much better with cough syrup prescribed 2 weeks ago.

## 2011-08-27 NOTE — Assessment & Plan Note (Signed)
The patient was advised to [facility]up with [facility] as soon as possible. She has an appointment with them for July 11. Advised the possibilities for endometrial thickening and the possibility of hysterectomy. Provider support information about endometrial thickening and endometrial biopsy. Patient and her daughters understand that this could potentially be a cancer or precancer situation and agreed to followup with [facility].

## 2011-08-27 NOTE — Assessment & Plan Note (Signed)
Advice to lose weight.

## 2011-08-27 NOTE — Assessment & Plan Note (Signed)
Patient is persistently having pain and swelling in her left wrist which is not improved with the brace. I would check x-rays of her left wrist to make sure she does not fracture a bone. Continue to use the brace and Vicodin as needed.

## 2011-08-27 NOTE — Assessment & Plan Note (Signed)
Norvasc added to the medication regimen today. Patient advised to continue to lose weight.

## 2011-08-27 NOTE — Progress Notes (Signed)
  Subjective:    Patient ID: Nancy [name], female    DOB: 1955-07-02, 56 y.o.   MRN: [id number]  HPI Nancy [name] is a 56 year old female with past medical history as noted. She is here today for her followup from 2 weeks ago.   #1 Cough: Much better now. Patient is taking cough syrup as prescribed.  #2 Vaginal bleeding: Patient had  [place] of genitourinary region which showed thickened uterine wall and [facility] I discussed the meaning of endometrial thickening and possibility of hysterectomy. The daughters and Nancy [name] understand that this thickening may mean that she could have cancer of the uterus. They will [facility]up [facility].  #3 BP: Still elevated today. She did not take her BP meds this morning.   Patient still complaining of pain in her left wrist.  Review of Systems  Constitutional: Negative for fever, activity change and appetite change.  HENT: Negative for sore throat.   Respiratory: Negative for cough and shortness of breath.   Cardiovascular: Negative for chest pain and leg swelling.  Gastrointestinal: Negative for nausea, abdominal pain, diarrhea, constipation and abdominal distention.  Genitourinary: Negative for frequency, hematuria and difficulty urinating.  Musculoskeletal: Positive for arthralgias.  Neurological: Negative for dizziness and headaches.  Psychiatric/Behavioral: Negative for suicidal ideas and behavioral problems.       Objective:   Physical Exam  Constitutional: She is oriented to person, place, and time. She appears well-developed and well-nourished.       Morbidly obese woman  HENT:  Head: Normocephalic and atraumatic.  Eyes: Conjunctivae and EOM are normal. Pupils are equal, round, and reactive to light. No scleral icterus.  Neck: Normal range of motion. Neck supple. No JVD present. No thyromegaly present.  Cardiovascular: Normal rate, regular rhythm, normal heart sounds  and intact distal pulses.  Exam reveals no gallop and no friction rub.   No murmur heard. Pulmonary/Chest: Effort normal and breath sounds normal. No respiratory distress. She has no wheezes. She has no rales.  Abdominal: Soft. Bowel sounds are normal. She exhibits no distension and no mass. There is no tenderness. There is no rebound and no guarding.  Musculoskeletal: Normal range of motion. She exhibits edema (1+ pitting edema bilaterally up to midshin) and tenderness (tenderness present over the left wrist ).  Lymphadenopathy:    She has no cervical adenopathy.  Neurological: She is alert and oriented to person, place, and time.  Psychiatric: She has a normal mood and affect. Her behavior is normal.          Assessment & Plan:

## 2011-08-27 NOTE — Patient Instructions (Addendum)
Endometrial Biopsy This is a test in which a tissue sample (a biopsy) is taken from inside the uterus (womb). It is then looked at by a specialist under a microscope to see if the tissue is normal or abnormal. The endometrium is the lining of the uterus. This test helps determine where you are in your menstrual cycle and how hormone levels are affecting the lining of the uterus. Another use for this test is to diagnose endometrial cancer, tuberculosis, polyps, or inflammatory conditions and to evaluate uterine bleeding. PREPARATION FOR TEST No preparation or fasting is necessary. NORMAL FINDINGS No pathologic conditions. Presence of "secretory-type" endometrium 3 to 5 days before to normal menstruation. Ranges for normal findings may vary among different laboratories and hospitals. You should always check with your doctor after having lab work or other tests done to discuss the meaning of your test results and whether your values are considered within normal limits. MEANING OF TEST  Your caregiver will go over the test results with you and discuss the importance and meaning of your results, as well as treatment options and the need for additional tests if necessary. OBTAINING THE TEST RESULTS It is your responsibility to obtain your test results. Ask the lab or department performing the test when and how you will get your results. Document Released: 06/26/2004 Document Revised: 02/12/2011 Document Reviewed: 02/03/2008 [employer] Patient Information 2012 [employer]. 

## 2011-09-17 ENCOUNTER — Ambulatory Visit (INDEPENDENT_AMBULATORY_CARE_PROVIDER_SITE_OTHER): Payer: Medicare Other | Admitting: Obstetrics & Gynecology

## 2011-09-17 ENCOUNTER — Encounter: Payer: Self-pay | Admitting: Obstetrics & Gynecology

## 2011-09-17 VITALS — BP 160/86 | HR 75 | Temp 97.6°F | Ht 63.0 in | Wt 292.6 lb

## 2011-09-17 DIAGNOSIS — G4730 Sleep apnea, unspecified: Secondary | ICD-10-CM

## 2011-09-17 DIAGNOSIS — R9389 Abnormal findings on diagnostic imaging of other specified body structures: Secondary | ICD-10-CM | POA: Insufficient documentation

## 2011-09-17 DIAGNOSIS — Z1231 Encounter for screening mammogram for malignant neoplasm of breast: Secondary | ICD-10-CM

## 2011-09-17 LAB — POCT PREGNANCY, URINE: Preg Test, Ur: NEGATIVE

## 2011-09-17 MED ORDER — MEGESTROL ACETATE 20 MG PO TABS: 60.0000 mg | ORAL_TABLET | Freq: Every day | ORAL | Status: AC

## 2011-09-17 NOTE — Progress Notes (Signed)
History:  56 y.o. F. here today for evaluation of irregular bleeding and thickened endometrium (12.6 mm) in the setting of morbid obesity (BMI 52).  She reports having heavy bleeding in the past, but in the last year she has 1-2 days of light bleeding every month, sometimes twice a month.  No symptoms of anemia.  Patient is wheelchair-bound. No other GYN symptoms.  The following portions of the patient's history were reviewed and updated as appropriate: allergies, current medications, past family history, past medical history, past social history, past surgical history and problem list.  Review of Systems:  Pertinent items are noted in HPI.  Objective:  Physical Exam Blood pressure 160/86, pulse 75, temperature 97.6 F (36.4 C), height 5\' 3"  (1.6 m), weight 292 lb 9.6 oz (132.722 kg). Gen: NAD Abd: Soft, obese, nontender and nondistended Pelvic: Deferred  Labs and Imaging 08/25/2011  TRANSABDOMINAL AND TRANSVAGINAL ULTRASOUND OF PELVIS  Clinical Data: Post menopausal bleeding.  Comparison:  06/18/2009.  Findings:  Uterus: Measures 8.7 x 4.9 x 5.2 cm.  No myometrial abnormalities are demonstrated.  Endometrium: 12.6 mm in thickness.  This is abnormal for a postmenopausal patient not on hormone replacement therapy. Endometrial sampling is recommended.  Right ovary:  Not visualized. No adnexal mass.  Left ovary: Measures 1.6 x 1.1 x 1.5 cm.  No cysts or masses.  Other findings: No free fluid  IMPRESSION:  1.  Thickened endometrium measuring 12.6 mm.  Recommend endometrial sampling. 2.  Non-visualization of the right ovary. No adnexal mass. 3.  Normal left ovary.  Original Report Authenticated By: P. Loralie Champagne, M.D.   Assessment & Plan:  For endometrial samplng, recommended D&C, hysteroscopy given increased risk for endometrial hyperplasia/neoplasia, morbid obesity and limited mobility.  Risks of surgery were discussed with the patient including but not limited to: bleeding which may require  transfusion; infection which may require antibiotics; injury to uterus or surrounding organs; intrauterine scarring which may impair future fertility; need for additional procedures including laparotomy or laparoscopy; and other postoperative/anesthesia complications.  The patient also understands the alternative treatment options which were discussed in full. All questions were answered.  She was told that she will be contacted by our surgical scheduler regarding the time and date of her surgery; routine preoperative instructions of having nothing to eat or drink after midnight on the day prior to surgery and also coming to the hospital 1 1/2 hours prior to her time of surgery were also emphasized.  She was told she may be called for a preoperative appointment about a week prior to surgery and will be given further preoperative instructions at that visit. Printed patient education handouts about the procedure were given to the patient to review at home. Megace 60 mg daily prescribed for her irregular bleeding; bleeding precautions given.

## 2011-09-17 NOTE — Progress Notes (Signed)
States has not gone a year without vaginal bleeding. States lately has been having spotting or a light period for 2-3 days at a time.

## 2011-09-17 NOTE — Patient Instructions (Addendum)
Hysteroscopy Hysteroscopy is a procedure used for looking inside the womb (uterus). It may be done for many different reasons, including:  To evaluate abnormal bleeding, fibroid (benign, noncancerous) tumors, polyps, scar tissue (adhesions), and possibly cancer of the uterus.   To look for lumps (tumors) and other uterine growths.   To look for causes of why a woman cannot get pregnant (infertility), causes of recurrent loss of pregnancy (miscarriages), or a lost intrauterine device (IUD).   To perform a sterilization by blocking the fallopian tubes from inside the uterus.  A hysteroscopy should be done right after a menstrual period to be sure you are not pregnant. LET YOUR CAREGIVER KNOW ABOUT:   Allergies.   Medicines taken, including herbs, eyedrops, over-the-counter medicines, and creams.   Use of steroids (by mouth or creams).   Previous problems with anesthetics or numbing medicines.   History of bleeding or blood problems.   History of blood clots.   Possibility of pregnancy, if this applies.   Previous surgery.   Other health problems.  RISKS AND COMPLICATIONS   Putting a hole in the uterus.   Excessive bleeding.   Infection.   Damage to the cervix.   Injury to other organs.   Allergic reaction to medicines.   Too much fluid used in the uterus for the procedure.  BEFORE THE PROCEDURE   Do not take aspirin or blood thinners for a week before the procedure, or as directed. It can cause bleeding.   Arrive at least 60 minutes before the procedure or as directed to read and sign the necessary forms.   Arrange for someone to take you home after the procedure.   If you smoke, do not smoke for 2 weeks before the procedure.  PROCEDURE   Your caregiver may give you medicine to relax you. He or she may also give you a medicine that numbs the area around the cervix (local anesthetic) or a medicine that makes you sleep (general anesthesia).   Sometimes, a  medicine is placed in the cervix the day before the procedure. This medicine makes the cervix have a larger opening (dilate). This makes it easier for the instrument to be inserted into the uterus.   A small instrument (hysteroscope) is inserted through the vagina into the uterus. This instrument is similar to a pencil-sized telescope with a light.   During the procedure, air or a liquid is put into the uterus, which allows the surgeon to see better.   Sometimes, tissue is gently scraped from inside the uterus. These tissue samples are sent to a specialist who looks at tissue samples (pathologist). The pathologist will give a report to your caregiver. This will help your caregiver decide if further treatment is necessary. The report will also help your caregiver decide on the best treatment if the test comes back abnormal.  AFTER THE PROCEDURE   If you had a general anesthetic, you may be groggy for a couple hours after the procedure.   If you had a local anesthetic, you will be advised to rest at the surgical center or caregiver's office until you are stable and feel ready to go home.   You may have some cramping for a couple days.   You may have bleeding, which varies from light spotting for a few days to menstrual-like bleeding for up to 3 to 7 days. This is normal.   Have someone take you home.  FINDING OUT THE RESULTS OF YOUR TEST Not all test results   are available during your visit. If your test results are not back during the visit, make an appointment with your caregiver to find out the results. Do not assume everything is normal if you have not heard from your caregiver or the medical facility. It is important for you to follow up on all of your test results. HOME CARE INSTRUCTIONS   Do not drive for 24 hours or as instructed.   Only take over-the-counter or prescription medicines for pain, discomfort, or fever as directed by your caregiver.   Do not take aspirin. It can cause or  aggravate bleeding.   Do not drive or drink alcohol while taking pain medicine.   You may resume your usual diet.   Do not use tampons, douche, or have sexual intercourse for 2 weeks, or as advised by your caregiver.   Rest and sleep for the first 24 to 48 hours.   Take your temperature twice a day for 4 to 5 days. Write it down. Give these temperatures to your caregiver if they are abnormal (above 98.6 F or 37.0 C).   Take medicines your caregiver has ordered as directed.   Follow your caregiver's advice regarding diet, exercise, lifting, driving, and general activities.   Take showers instead of baths for 2 weeks, or as recommended by your caregiver.   If you develop constipation:   Take a mild laxative with the advice of your caregiver.   Eat bran foods.   Drink enough water and fluids to keep your urine clear or pale yellow.   Try to have someone with you or available to you for the first 24 to 48 hours, especially if you had a general anesthetic.   Make sure you and your family understand everything about your operation and recovery.   Follow your caregiver's advice regarding follow-up appointments and Pap smears.  SEEK MEDICAL CARE IF:   You feel dizzy or lightheaded.   You feel sick to your stomach (nauseous).   You develop abnormal vaginal discharge.   You develop a rash.   You have an abnormal reaction or allergy to your medicine.   You need stronger pain medicine.  SEEK IMMEDIATE MEDICAL CARE IF:   Bleeding is heavier than a normal menstrual period or you have blood clots.   You have an oral temperature above 102 F (38.9 C), not controlled by medicine.   You have increasing cramps or pains not relieved with medicine.   You develop belly (abdominal) pain that does not seem to be related to the same area of earlier cramping and pain.   You pass out.   You develop pain in the tops of your shoulders (shoulder strap areas).   You develop shortness of  breath.  MAKE SURE YOU:   Understand these instructions.   Will watch your condition.   Will get help right away if you are not doing well or get worse.  Document Released: 06/01/2000 Document Revised: 02/12/2011 Document Reviewed: 09/24/2008 [facility] Patient Information 2012 [place], [place].  Dilation and Curettage or Vacuum Curettage Dilation and curettage (D&C) and vacuum curettage are minor procedures. A D&C involves stretching (dilation) the cervix and scraping (curettage) the inside lining of the womb (uterus). During a D&C, tissue is gently scraped from the inside lining of the uterus. During a vacuum curettage, the lining and tissue in the uterus are removed with the use of gentle suction. Curettage may be performed for diagnostic or therapeutic purposes. As a diagnostic procedure, curettage is performed  for the purpose of examining tissues from the uterus. Tissue examination may help determine causes or treatment options for symptoms. A diagnostic curettage may be performed for the following symptoms:  Irregular bleeding in the uterus.   Bleeding with the development of clots.   Spotting between menstrual periods.   Prolonged menstrual periods.   Bleeding after menopause.   No menstrual period (amenorrhea).   A change in size and shape of the uterus.  A therapeutic curettage is performed to remove tissue, blood, or a contraceptive device. Therapeutic curettage may be performed for the following conditions:   Removal of an IUD (intrauterine device).   Removal of retained placenta after giving birth. Retained placenta can cause bleeding severe enough to require transfusions or an infection.   Abortion.   Miscarriage.   Removal of polyps inside the uterus.   Removal of uncommon types of fibroids (noncancerous lumps).  LET YOUR CAREGIVER KNOW ABOUT:   Allergies to food or medicine.   Medicines taken, including vitamins, herbs, eyedrops, over-the-counter medicines,  and creams.   Use of steroids (by mouth or creams).   Previous problems with anesthetics or numbing medicines.   History of bleeding problems or blood clots.   Previous surgery.   Other health problems, including diabetes and kidney problems.   Possibility of pregnancy, if this applies.  RISKS AND COMPLICATIONS   Excessive bleeding.   Infection of the uterus.   Damage to the cervix.   Development of scar tissue (adhesions) inside the uterus, later causing abnormal amounts of menstrual bleeding.   Complications from the general anesthetic, if a general anesthetic is used.   Putting a hole (perforation) in the uterus. This is rare.  BEFORE THE PROCEDURE   Eat and drink before the procedure only as directed by your caregiver.   Arrange for someone to take you home.  PROCEDURE   This procedure may be done in a hospital, outpatient clinic, or caregiver's office.   You may be given a general anesthetic or a local anesthetic in and around the cervix.   You will lie on your back with your legs in stirrups.   There are two ways in which your cervix can be softened and dilated. These include:   Taking a medicine.   Having thin rods (laminaria) inserted into your cervix.   A curved tool (curette) will scrape cells from the inside lining of the uterus and will then be removed.  This procedure usually takes about 15 to 30 minutes. AFTER THE PROCEDURE   You will rest in the recovery area until you are stable and are ready to go home.   You will need to have someone take you home.   You may feel sick to your stomach (nauseous) or throw up (vomit) if you had general anesthesia.   You may have a sore throat if a tube was placed in your throat during general anesthesia.   You may have light cramping and bleeding for 2 days to 2 weeks after the procedure.   Your uterus needs to make a new lining after the procedure. This may make your next period late.  Document Released:  02/23/2005 Document Revised: 02/12/2011 Document Reviewed: 09/21/2008 [facility] Patient Information 2012 [place], [place].

## 2011-09-29 ENCOUNTER — Encounter: Payer: Self-pay | Admitting: Obstetrics & Gynecology

## 2011-10-13 ENCOUNTER — Ambulatory Visit (HOSPITAL_COMMUNITY)
Admission: RE | Admit: 2011-10-13 | Discharge: 2011-10-13 | Disposition: A | Discharge status: Home / Self Care | Payer: Medicare Other | Source: Ambulatory Visit | Attending: Obstetrics & Gynecology | Admitting: Obstetrics & Gynecology

## 2011-10-13 DIAGNOSIS — Z1231 Encounter for screening mammogram for malignant neoplasm of breast: Secondary | ICD-10-CM | POA: Insufficient documentation

## 2011-11-05 ENCOUNTER — Encounter (HOSPITAL_COMMUNITY): Payer: Self-pay

## 2011-11-05 ENCOUNTER — Other Ambulatory Visit: Payer: Self-pay

## 2011-11-05 ENCOUNTER — Encounter (HOSPITAL_COMMUNITY): Payer: Self-pay | Admitting: Emergency Medicine

## 2011-11-05 ENCOUNTER — Emergency Department (HOSPITAL_COMMUNITY): Payer: Medicare Other

## 2011-11-05 ENCOUNTER — Inpatient Hospital Stay (HOSPITAL_COMMUNITY)
Admission: EM | Admit: 2011-11-05 | Discharge: 2011-11-07 | DRG: 602 | Disposition: A | Discharge status: Home / Self Care | Payer: Medicare Other | Attending: Internal Medicine | Admitting: Internal Medicine

## 2011-11-05 ENCOUNTER — Inpatient Hospital Stay (HOSPITAL_COMMUNITY): Payer: Medicare Other

## 2011-11-05 DIAGNOSIS — D72829 Elevated white blood cell count, unspecified: Secondary | ICD-10-CM

## 2011-11-05 DIAGNOSIS — E871 Hypo-osmolality and hyponatremia: Secondary | ICD-10-CM | POA: Diagnosis present

## 2011-11-05 DIAGNOSIS — D649 Anemia, unspecified: Secondary | ICD-10-CM

## 2011-11-05 DIAGNOSIS — Z8249 Family history of ischemic heart disease and other diseases of the circulatory system: Secondary | ICD-10-CM

## 2011-11-05 DIAGNOSIS — L03115 Cellulitis of right lower limb: Secondary | ICD-10-CM

## 2011-11-05 DIAGNOSIS — D509 Iron deficiency anemia, unspecified: Secondary | ICD-10-CM | POA: Diagnosis present

## 2011-11-05 DIAGNOSIS — G935 Compression of brain: Secondary | ICD-10-CM | POA: Diagnosis present

## 2011-11-05 DIAGNOSIS — M2550 Pain in unspecified joint: Secondary | ICD-10-CM | POA: Diagnosis present

## 2011-11-05 DIAGNOSIS — G4733 Obstructive sleep apnea (adult) (pediatric): Secondary | ICD-10-CM | POA: Diagnosis present

## 2011-11-05 DIAGNOSIS — M3500 Sicca syndrome, unspecified: Secondary | ICD-10-CM | POA: Diagnosis present

## 2011-11-05 DIAGNOSIS — Z981 Arthrodesis status: Secondary | ICD-10-CM

## 2011-11-05 DIAGNOSIS — L02419 Cutaneous abscess of limb, unspecified: Principal | ICD-10-CM | POA: Diagnosis present

## 2011-11-05 DIAGNOSIS — L03119 Cellulitis of unspecified part of limb: Principal | ICD-10-CM | POA: Diagnosis present

## 2011-11-05 DIAGNOSIS — Z6841 Body Mass Index (BMI) 40.0 and over, adult: Secondary | ICD-10-CM

## 2011-11-05 DIAGNOSIS — Z79899 Other long term (current) drug therapy: Secondary | ICD-10-CM

## 2011-11-05 DIAGNOSIS — N179 Acute kidney failure, unspecified: Secondary | ICD-10-CM | POA: Diagnosis present

## 2011-11-05 DIAGNOSIS — J302 Other seasonal allergic rhinitis: Secondary | ICD-10-CM

## 2011-11-05 DIAGNOSIS — E039 Hypothyroidism, unspecified: Secondary | ICD-10-CM | POA: Diagnosis present

## 2011-11-05 DIAGNOSIS — I10 Essential (primary) hypertension: Secondary | ICD-10-CM | POA: Diagnosis present

## 2011-11-05 DIAGNOSIS — Z7982 Long term (current) use of aspirin: Secondary | ICD-10-CM

## 2011-11-05 DIAGNOSIS — E876 Hypokalemia: Secondary | ICD-10-CM

## 2011-11-05 DIAGNOSIS — N289 Disorder of kidney and ureter, unspecified: Secondary | ICD-10-CM

## 2011-11-05 LAB — SODIUM, URINE, RANDOM: Sodium, Ur: 16 meq/L

## 2011-11-05 LAB — COMPREHENSIVE METABOLIC PANEL WITH GFR
ALT: 9 U/L (ref 0–35)
AST: 18 U/L (ref 0–37)
Albumin: 3.4 g/dL — ABNORMAL LOW (ref 3.5–5.2)
Alkaline Phosphatase: 60 U/L (ref 39–117)
BUN: 23 mg/dL (ref 6–23)
CO2: 27 meq/L (ref 19–32)
Calcium: 8.9 mg/dL (ref 8.4–10.5)
Chloride: 94 meq/L — ABNORMAL LOW (ref 96–112)
Creatinine, Ser: 1.46 mg/dL — ABNORMAL HIGH (ref 0.50–1.10)
GFR calc Af Amer: 45 mL/min — ABNORMAL LOW
GFR calc non Af Amer: 39 mL/min — ABNORMAL LOW
Glucose, Bld: 108 mg/dL — ABNORMAL HIGH (ref 70–99)
Potassium: 3.2 meq/L — ABNORMAL LOW (ref 3.5–5.1)
Sodium: 131 meq/L — ABNORMAL LOW (ref 135–145)
Total Bilirubin: 1.1 mg/dL (ref 0.3–1.2)
Total Protein: 8.2 g/dL (ref 6.0–8.3)

## 2011-11-05 LAB — CBC WITH DIFFERENTIAL/PLATELET
Basophils Absolute: 0 10*3/uL (ref 0.0–0.1)
Basophils Relative: 0 % (ref 0–1)
Eosinophils Absolute: 0 10*3/uL (ref 0.0–0.7)
Eosinophils Relative: 0 % (ref 0–5)
HCT: 32.8 % — ABNORMAL LOW (ref 36.0–46.0)
Hemoglobin: 10.6 g/dL — ABNORMAL LOW (ref 12.0–15.0)
Lymphocytes Relative: 8 % — ABNORMAL LOW (ref 12–46)
Lymphs Abs: 2 10*3/uL (ref 0.7–4.0)
MCH: 23.6 pg — ABNORMAL LOW (ref 26.0–34.0)
MCHC: 32.3 g/dL (ref 30.0–36.0)
MCV: 73.1 fL — ABNORMAL LOW (ref 78.0–100.0)
Monocytes Absolute: 1 10*3/uL (ref 0.1–1.0)
Monocytes Relative: 4 % (ref 3–12)
Neutro Abs: 22.1 10*3/uL — ABNORMAL HIGH (ref 1.7–7.7)
Neutrophils Relative %: 88 % — ABNORMAL HIGH (ref 43–77)
Platelets: 212 10*3/uL (ref 150–400)
RBC: 4.49 MIL/uL (ref 3.87–5.11)
RDW: 15.1 % (ref 11.5–15.5)
WBC Morphology: INCREASED
WBC: 25.1 10*3/uL — ABNORMAL HIGH (ref 4.0–10.5)

## 2011-11-05 LAB — CREATININE, URINE, RANDOM: Creatinine, Urine: 163.27 mg/dL

## 2011-11-05 MED ORDER — LEVOTHYROXINE SODIUM 50 MCG PO TABS
50.0000 ug | ORAL_TABLET | Freq: Every day | ORAL | Status: DC
  Administered 2011-11-06 – 2011-11-07 (×2): 50 ug via ORAL
  Filled 2011-11-05 (×3): qty 1

## 2011-11-05 MED ORDER — DOXYCYCLINE HYCLATE 100 MG IV SOLR
100.0000 mg | Freq: Once | INTRAVENOUS | Status: AC
  Administered 2011-11-05: 100 mg via INTRAVENOUS
  Filled 2011-11-05 (×2): qty 100

## 2011-11-05 MED ORDER — SODIUM CHLORIDE 0.9 % IV SOLN
1.0000 g | Freq: Four times a day (QID) | INTRAVENOUS | Status: DC
  Administered 2011-11-05 – 2011-11-06 (×2): 1 g via INTRAVENOUS
  Filled 2011-11-05 (×4): qty 1000

## 2011-11-05 MED ORDER — VANCOMYCIN HCL IN DEXTROSE 1-5 GM/200ML-% IV SOLN
1000.0000 mg | Freq: Once | INTRAVENOUS | Status: AC
  Administered 2011-11-05: 1000 mg via INTRAVENOUS
  Filled 2011-11-05: qty 200

## 2011-11-05 MED ORDER — ASPIRIN EC 81 MG PO TBEC: 81.0000 mg | DELAYED_RELEASE_TABLET | Freq: Every day | ORAL | Status: DC

## 2011-11-05 MED ORDER — DEXTROSE 5 % IV SOLN
2.0000 g | Freq: Two times a day (BID) | INTRAVENOUS | Status: DC
  Administered 2011-11-05 – 2011-11-07 (×4): 2 g via INTRAVENOUS
  Filled 2011-11-05 (×7): qty 2

## 2011-11-05 MED ORDER — ACETAMINOPHEN 650 MG RE SUPP: 650.0000 mg | Freq: Four times a day (QID) | RECTAL | Status: DC | PRN

## 2011-11-05 MED ORDER — VANCOMYCIN HCL 1000 MG IV SOLR
1750.0000 mg | INTRAVENOUS | Status: DC
  Filled 2011-11-05: qty 1750

## 2011-11-05 MED ORDER — MORPHINE SULFATE 4 MG/ML IJ SOLN
4.0000 mg | INTRAMUSCULAR | Status: DC | PRN
  Administered 2011-11-07: 2 mg via INTRAVENOUS
  Filled 2011-11-05: qty 1

## 2011-11-05 MED ORDER — VANCOMYCIN HCL 1000 MG IV SOLR
2000.0000 mg | Freq: Once | INTRAVENOUS | Status: AC
  Administered 2011-11-05: 2000 mg via INTRAVENOUS
  Filled 2011-11-05: qty 2000

## 2011-11-05 MED ORDER — ALUM & MAG HYDROXIDE-SIMETH 200-200-20 MG/5ML PO SUSP: 30.0000 mL | Freq: Four times a day (QID) | ORAL | Status: DC | PRN

## 2011-11-05 MED ORDER — ALBUTEROL SULFATE HFA 108 (90 BASE) MCG/ACT IN AERS: 1.0000 | INHALATION_SPRAY | Freq: Four times a day (QID) | RESPIRATORY_TRACT | Status: DC | PRN

## 2011-11-05 MED ORDER — SODIUM CHLORIDE 0.9 % IV SOLN
1000.0000 mL | INTRAVENOUS | Status: DC
  Administered 2011-11-05: 1000 mL via INTRAVENOUS

## 2011-11-05 MED ORDER — METOPROLOL TARTRATE 50 MG PO TABS
50.0000 mg | ORAL_TABLET | Freq: Two times a day (BID) | ORAL | Status: DC
  Administered 2011-11-06 – 2011-11-07 (×3): 50 mg via ORAL
  Filled 2011-11-05 (×5): qty 1

## 2011-11-05 MED ORDER — PIPERACILLIN-TAZOBACTAM 3.375 G IVPB
3.3750 g | Freq: Once | INTRAVENOUS | Status: DC
  Filled 2011-11-05: qty 50

## 2011-11-05 MED ORDER — LORAZEPAM 2 MG/ML IJ SOLN: 1.0000 mg | Freq: Once | INTRAMUSCULAR | Status: AC | PRN

## 2011-11-05 MED ORDER — ACETAMINOPHEN 325 MG PO TABS
975.0000 mg | ORAL_TABLET | Freq: Once | ORAL | Status: AC
  Administered 2011-11-05: 975 mg via ORAL
  Filled 2011-11-05: qty 3

## 2011-11-05 MED ORDER — CHLORTHALIDONE 25 MG PO TABS
25.0000 mg | ORAL_TABLET | Freq: Every day | ORAL | Status: DC
  Filled 2011-11-05 (×2): qty 1

## 2011-11-05 MED ORDER — ONDANSETRON HCL 4 MG/2ML IJ SOLN: 4.0000 mg | Freq: Four times a day (QID) | INTRAMUSCULAR | Status: DC | PRN

## 2011-11-05 MED ORDER — SODIUM CHLORIDE 0.9 % IV SOLN
1000.0000 mL | Freq: Once | INTRAVENOUS | Status: AC
  Administered 2011-11-05: 1000 mL via INTRAVENOUS

## 2011-11-05 MED ORDER — POTASSIUM CHLORIDE CRYS ER 20 MEQ PO TBCR
40.0000 meq | EXTENDED_RELEASE_TABLET | Freq: Four times a day (QID) | ORAL | Status: DC
  Administered 2011-11-06: 40 meq via ORAL
  Filled 2011-11-05: qty 2

## 2011-11-05 MED ORDER — ACETAMINOPHEN 325 MG PO TABS: 650.0000 mg | ORAL_TABLET | Freq: Four times a day (QID) | ORAL | Status: DC | PRN

## 2011-11-05 MED ORDER — HEPARIN SODIUM (PORCINE) 5000 UNIT/ML IJ SOLN
5000.0000 [IU] | Freq: Three times a day (TID) | INTRAMUSCULAR | Status: DC
  Administered 2011-11-05 – 2011-11-07 (×5): 5000 [IU] via SUBCUTANEOUS
  Filled 2011-11-05 (×8): qty 1

## 2011-11-05 MED ORDER — SODIUM CHLORIDE 0.9 % IV SOLN
INTRAVENOUS | Status: DC
  Administered 2011-11-06: 06:00:00 via INTRAVENOUS

## 2011-11-05 MED ORDER — ASPIRIN 325 MG PO TABS
325.0000 mg | ORAL_TABLET | Freq: Every day | ORAL | Status: DC
  Administered 2011-11-06 – 2011-11-07 (×2): 325 mg via ORAL
  Filled 2011-11-05 (×3): qty 1

## 2011-11-05 MED ORDER — DEXAMETHASONE SODIUM PHOSPHATE 10 MG/ML IJ SOLN
10.0000 mg | Freq: Four times a day (QID) | INTRAMUSCULAR | Status: DC
  Administered 2011-11-05 – 2011-11-06 (×2): 10 mg via INTRAVENOUS
  Filled 2011-11-05 (×6): qty 1

## 2011-11-05 MED ORDER — VANCOMYCIN HCL 10 G IV SOLR
1.0000 g | Freq: Once | INTRAVENOUS | Status: DC
  Filled 2011-11-05: qty 1000

## 2011-11-05 MED ORDER — ONDANSETRON HCL 4 MG PO TABS: 4.0000 mg | ORAL_TABLET | Freq: Four times a day (QID) | ORAL | Status: DC | PRN

## 2011-11-05 NOTE — ED Provider Notes (Signed)
Discussed patient with outpatient clinic--they will be in to see and admit.  Jimmye Norman, NP 11/06/11 [phone]

## 2011-11-05 NOTE — ED Notes (Signed)
Cold chills aching and nausea since yesterday w/ fever

## 2011-11-05 NOTE — ED Notes (Signed)
Called admitting DR. To inform him that the  patient refused MRI/ Notified Nurse on [id number]

## 2011-11-05 NOTE — ED Provider Notes (Signed)
Pt with R lower leg cellulitis who will need IV abx, and reassessment.  If tachycardia not improve or pt's condition worsen then consider admission.  I have not seen or evaluate pt, but report given to oncoming CDU provider, Felicie Morn, NP.   Fayrene Helper, PA-C 11/05/11 1516

## 2011-11-05 NOTE — ED Notes (Signed)
REPORT CALLED

## 2011-11-05 NOTE — ED Provider Notes (Signed)
See CDU notes, pt seen by me and followed by PA in CDU  Ward Givens, MD 11/05/11 2011

## 2011-11-05 NOTE — H&P (Signed)
[facility] Admission Note Date: 11/05/2011  Patient name: Nancy [name] Medical record number: [id number] Date of birth: 06/18/1955 Age: 56 y.o. Gender: female PCP: Lars Mage, MD  Medical Service:Internal Medicine Teaching Service  Attending physician:      Chief Complaint: Fever, headache, leg pain  History of Present Illness: The patient is a 56 year old woman with a history of hypothyroidism, hypertension, morbid obesity and Sjogren's syndrome who presents to the ED with a 1 day history of fevers (Tmax 104 approximately 22 hours ago), headache, neck pain and right lower extremity pain and redness. She was feeling well prior to yesterday at which time she felt chills, myalgias and headache. She endorses pain and stiffness in the back and sides of her neck, as well as head pain at the top of her head. Her head and neck pain is exacerbated by movement and touch, and alleviated some by rest. The pain is "20/10" on palpation and movement of her neck. She also endorses a 1-2 day history of mild congestion, sore throat and facial pain, as well a cough productive of a yellowish sputum. She also endorses some mild nausea and bilateral aching chest pain. She denies any photophobia, confusion, rhinnorrhea, ear pain or changes in hearing, head trauma, recent illnesses or vomiting.   The patient also has a 1 day history of redness, pain and swelling of the right ankle. She had been outside and active with her grandchildren but denies any trauma, tick bites. The pain is exacerbated with palpation, and it does not hurt with rest. She denies any other skin changes, discharge or pain anywhere else.  Of note she has had a 2 week history of menorrhagia for which she is scheduled for a D&C on 9/16. She denies use of tampons for her bleeding and only uses pads.  In the ED, she was given 1L NS, doxycycline for possible tick-borne illness, and vanc and zosyn for probable cellulitis.  Meds: Current  Outpatient Rx  Name Route Sig Dispense Refill  . ASPIRIN 325 MG PO TABS Oral Take 325 mg by mouth daily.    . CHLORTHALIDONE 25 MG PO TABS Oral Take 1 tablet (25 mg total) by mouth daily. 90 tablet 1  . FERROUS SULFATE 325 (65 FE) MG PO TABS Oral Take 325 mg by mouth 3 (three) times daily with meals.     Marland Kitchen LEVOTHYROXINE SODIUM 50 MCG PO TABS Oral Take 1 tablet (50 mcg total) by mouth daily. 90 tablet 1  . MEGESTROL ACETATE 20 MG PO TABS Oral Take 60 mg by mouth daily.    Marland Kitchen METOPROLOL TARTRATE 50 MG PO TABS Oral Take 50 mg by mouth 2 (two) times daily.     . ALBUTEROL SULFATE HFA 108 (90 BASE) MCG/ACT IN AERS Inhalation Inhale 1 puff into the lungs every 6 (six) hours as needed for wheezing. 1 Inhaler 2  . FLUTICASONE PROPIONATE 50 MCG/ACT NA SUSP Each Nare Place 1 each into both nostrils every morning.    Marland Kitchen HYDROCODONE-ACETAMINOPHEN 5-500 MG PO TABS Oral Take 1 tablet by mouth every 8 (eight) hours as needed. As needed for severe pain.      Allergies: Allergies as of 11/05/2011 - Review Complete 11/05/2011  Allergen Reaction Noted  . Amlodipine  09/26/2010  . Coconut fatty acids  07/21/2011  . Naproxen sodium Nausea And Vomiting    Past Medical History  Diagnosis Date  . Primary Sjogren's syndrome     anti Ro+;ANA>1:1280 in homogen pattern,  negative ds DNA/RF/anti Smith/RNP/C3-4 comp/la/jo1/Scleroderma/centromere, neg HIV/ACE/Hep B/C, nl CXR 2/05, schirmer salivary glandtest not done, symptom rx:eye drops/prednisone/plaquenil referral to [facility] rheum, [name] 3/07. saw [name] but stopped 2/2 cost.  . Hypertension     no LVH EKG-7/05, nl M/C ratio 4/07  . Morbid obesity   . Back pain     chronic low back pain L4-5 discectomy:11/99. degenerative thoracic spondylotic changes 4/07  . Obstructive sleep apnea     severe 7/05 RD 161 per hr. /CPAP 18cwp  . Hypothyroidism     09/1998  . Microcytic anemia     09/1998. Hb-10.5/MCV 76. needs ferritin to determine ACD vs IDA  .  Menorrhagia   . Uterine bleeding   . Lower extremity edema     echo EF 55-65% w/o evidence of Dias dysfx.,   . Polyarthralgia     (knee/back/ankle) w/dx of fibromyalgia? given by [facility] rheum, knee pain chronic 2/2 obesity, fibromyalgia and Sjogren's.  . Chest pain     neg adenosine myoview.   Past Surgical History  Procedure Date  . Tonsillectomy   . Lumbar fusion 1999  . Tubal ligation    Family History  Problem Relation Age of Onset  . Hypertension Mother   . Cancer Mother   . Cancer Father   . Cancer Sister   . Ovarian cancer      family history  . Breast cancer      family history in 1st degree relatives.  . Thyroid disease Sister    History   Social History  . Marital Status: Married    Spouse Name: Reita Cliche    Number of Children: N/A  . Years of Education: N/A   Occupational History  . house wife    Social History Main Topics  . Smoking status: Never Smoker   . Smokeless tobacco: Never Used  . Alcohol Use: No  . Drug Use: No  . Sexually Active: Yes -- Female partner(s)    Birth Control/ Protection: Post-menopausal   Other Topics Concern  . Not on file   Social History Narrative   Her daughter [name]) often comes with her to visits. She also has a grand daughter and great grand daughter(whose name is Stark Bray)    Review of Systems: A comprehensive review of systems was negative except for: Those listed in HPI  Physical Exam: Blood pressure 128/65, pulse 112, temperature 102.9 F (39.4 C), temperature source Rectal, resp. rate 20, SpO2 96.00%. General appearance: cooperative, fatigued, mild distress and morbidly obese Head: Normocephalic, without obvious abnormality, atraumatic, Frontal sinuses tender to palpation, maxillary mildly tender to palpation Eyes: sclera anicteric, PERRLA, no nystagmus, EOMI Ears: normal TM's and external ear canals both ears Nose: no discharge Throat: lips, mucosa, and tongue normal; teeth and gums normal Neck: Exquisitely  tender to palpation on lateral sides of her neck. Mildly tender posterior neck. Shotty lymphadenopathy on left side.  Lungs: clear to auscultation bilaterally and Air movement symmetric Breasts: normal appearance, no masses or tenderness Heart: regular rate and rhythm, S1, S2 normal, no murmur, click, rub or gallop Abdomen: soft, non-tender, could not appreciate organomegaly due to body habitus Extremities: Right lower extremity with erythema from mid foot to above ankle. Warm and tender to palpation. No open sores, bites, discharge, bleeding. Pulses: Radial pulses 2+, Lower extremities 1+ Skin: No other skin changes other than on RLE Lymph nodes: Mild left cervical lymphadenopathy Neurologic: Alert and oriented X 3,   Lab results: Basic Metabolic Panel:  [facility] 11/05/11  1500  NA 131*  K 3.2*  CL 94*  CO2 27  GLUCOSE 108*  BUN 23  CREATININE 1.46*  CALCIUM 8.9  MG --  PHOS --   Liver Function Tests:  Basename 11/05/11 1500  AST 18  ALT 9  ALKPHOS 60  BILITOT 1.1  PROT 8.2  ALBUMIN 3.4*     CBC:  Basename 11/05/11 1500  WBC 25.1*  NEUTROABS 22.1*  HGB 10.6*  HCT 32.8*  MCV 73.1*  PLT 212    Drugs of Abuse     Component Value Date/Time   LABOPIA NEG 08/13/2011 1116   COCAINSCRNUR NEG 08/13/2011 1116   LABBENZ NEG 08/13/2011 1116   LABBARB NEG 08/13/2011 1116       Imaging results:  Dg Chest 2 View  11/05/2011  *RADIOLOGY REPORT*  Clinical Data: Chest pain.  Shortness of breath.  Fever. Influenza.  CHEST - 2 VIEW  Comparison: Two-view chest x-ray 07/31/2010, 01/02/2008.  Findings: Suboptimal inspiration due to body habitus which accounts for crowded bronchovascular markings at the bases and accentuates the cardiac silhouette.  Taking this into account, cardiomediastinal silhouette unremarkable and unchanged.  Lungs clear.  Bronchovascular markings normal.  Pulmonary vascularity normal.  No pneumothorax.  No pleural effusions.  Stable chronic elevation of the right  hemidiaphragm.  Degenerative changes and DISH involving the thoracic spine.  No significant interval change.  IMPRESSION: Suboptimal inspiration.  No acute cardiopulmonary disease.   Original Report Authenticated By: Arnell Sieving, M.D.    Ct Head Wo Contrast  11/05/2011  *RADIOLOGY REPORT*  Clinical Data: Fever, chills and headache.  CT HEAD WITHOUT CONTRAST  Technique:  Contiguous axial images were obtained from the base of the skull through the vertex without contrast.  Comparison: Head CT 06/08/2008.  Findings: There is a fullness and crowding of structures in the region of the foramen magnum, which is suspicious for low lying cerebellar tonsils, which could be indicative of a Chiari 1 malformation.  No associated hydrocephalus at this time.  Faint physiologic calcifications of the basal ganglia bilaterally.  No acute intracranial abnormality is otherwise identified. Specifically, no evidence of acute intracerebral hemorrhage, no definite findings to suggest acute/subacute cerebral ischemia, no focal mass or abnormal intra or extra-axial fluid collections. Visualized paranasal sinuses and mastoids are remarkable for chronic bilateral mastoid effusions (similar to prior).  No acute displaced skull fractures are identified.  IMPRESSION: 1.  Findings, as above, concerning for potential Chiari 1 malformation.  No associated hydrocephalus at this time. This may be an etiology for the patient's headache.  This could be confirmed with a brain MRI. 2.  No other potentially acute findings in the brain. 3.  Chronic bilateral mastoid effusions, similar to prior study 06/08/2008.   Original Report Authenticated By: Florencia Reasons, M.D.      Other results: EKG: normal EKG, normal sinus rhythm, unchanged from previous tracings.  Assessment & Plan by Problem: This is a 56 year old woman who presents with acute onset fever, chills, head and neck pain, and right lower extremity erythema and pain.  Active  Problems:  Fever chills, neck pain: The patient presents with acute onset fever, chills, head and neck pain. She is not obtunded or confused, and has no obvious neurological deficits. She does endorses facial pain and sore throat, though aside from tenderness to frontal sinus palpation she has no signs of upper respiratory infection. History and exam for this patient is concerning for meningitis, though could also be due to  sinusitis. She could also be having fever and leukocytosis from right leg cellulitis (see next problem), with general malaise from infection. Chest x-ray is negative for infiltrates. --Obtain lumbar puncture with CSF gram stain, WBC count w/ diff, protein, glucose, HSV and viral panel.  --CT obtained, showed possible enlarged cerebellar tonsils. Radiology concerned and would like to proceed with MRI to rule out acute intracranial process prior to obtaining LP. --Begin Ceftriaxone in addition to Vancomycin for possible meningitis. --Consider possible frontal coronal head CT to eval for sinusitis. --Continue Doxy for now, though suspicion for Tick-Borne illness is low. --1 blood culture obtained in ED, obtain another to eval for bacteremia. --F/U Urine culture   Right Leg Cellulitis: The patient has erythema, warmth, and tenderness to palpation of her right ankle. She has no wounds or signs of trauma. Suspicion for cellulitis is very high. --Continue Vanc and Ceftriaxone as above, as these would cover common skin pathogens (Staph and Strep). --D/C Zosyn, as she no longer needs this.  Acute Kidney Injury w/ Hyponatremia, Hypokalemia: The patient endorses no food intake over the past 24 hours, but has been drinking fluids. She also took her chlorthalidone recently. Her baseline creatinine is around 0.8 and is currently 1.46. Her AKI, hyponatremia and hypokalemia are  are likely related to being hypovolemic, with free water intake and thiazide usage. She is not obtunded and has no EKG  abnormalities. -Will give NS @ 125cc/hr -Will give PO potassium for 3 doses. -Will not check urine chemistries at this time, as she is likely prerenal and her kidney function is dynamic at this time. Also would be of little use if taking Chlorthalidone. Will follow creatinine and reassess.  Anemia: The Patient's H/H is 10.6/32.8 with a MCV of 73.1, representing a microcytic anemia. These value are comparable with a CBC from 2010 indicating a chronic anemia, likely iron deficient secondary to her menorrhagia. --Will check an iron panel.  Chest Pain: The patient complains of generalized chest pain around her breasts. EKG is normal and history does not sound consistent with MI. Her pain is most likely secondary to myalgias and malaise. --Will check Cardiac Enzymes x 1 to rule out ACS.  Diet: Regular  VTE: Heparin    This is a Psychologist, occupational Note.  The care of the patient was discussed with Dr. Dorthula Rue and the assessment and plan was formulated with their assistance.  Please see their note for official documentation of the patient encounter.   Signed: Warren Lacy 11/05/2011, 6:05 PM   Internal Medicine Teaching Service Resident Admission Note Date: 11/05/2011  Patient name: Nancy [name] Medical record number: [id number] Date of birth: [birth year] Age: 56 y.o. Gender: female PCP: Lars Mage, MD  Medical Service:  I have reviewed the note by Warren Lacy MS IV and was present during the interview and physical exam.  Please see below for findings, assessment, and plan.  Chief Complaint: fever, chills, headache, right leg pain , malaise for 1 day  History of Present Illness: 56 year old woman with past medical history significant for obesity, hypertension, hypothyroidism, L4- L5 laminectomy in 1999 , endometrial thickening seen on trans vaginal ultrasound on 06/13 comes to the fever, chills, headaches, neck pain, right leg pain  for one day.  She was in her usual  state of health until 2 days ago when she started having fevers with chills. She reports a MAXIMUM TEMPERATURE of 104 at home.  She reports having productive cough where she is bringing  Yellowish stuff starting yesterday along with sore throat and congestion but denies any hemoptysis. She also reports having headaches and neck pain that gets worse with coughing- mostly located in the frontal and parietal areas , rates her headache and  neck pain 20 out of 10 at worst, associated with stiffness .  Also Report some nausea but denies photophobia. She  also noticed redness and pain in her right leg starting from her ankle associated with some swelling since yesterday.She also endorses malaise, chest pain and breast soreness.   She also has a sick grandchild at home who suffered from flulike symptoms. She denies any abdominal pain, dysuria, urgency, frequency, hematuria, or change in bladder or bowel habits.   Of note she has had a 2 week history of menorrhagia for which she is scheduled for a D&C on 9/16. She denies use of tampons for her bleeding and only uses pads.  In the ED, she was given 1L NS, doxycycline for possible tick-borne illness, and vanc and zosyn for probable cellulitis.    Meds: Current facility-administered medications:0.9 %  sodium chloride infusion, 1,000 mL, Intravenous, Once, Ward Givens, MD, 1,000 mL at 11/05/11 1638;  0.9 %  sodium chloride infusion, 1,000 mL, Intravenous, Continuous, Ward Givens, MD, Last Rate: 125 mL/hr at 11/05/11 1645, 1,000 mL at 11/05/11 1645;  acetaminophen (TYLENOL) tablet 975 mg, 975 mg, Oral, Once, Jimmye Norman, NP, 975 mg at 11/05/11 1645 chlorthalidone (HYGROTON) tablet 25 mg, 25 mg, Oral, Daily, Elyse Jarvis, MD;  doxycycline (VIBRAMYCIN) 100 mg in dextrose 5 % 250 mL IVPB, 100 mg, Intravenous, Once, Ward Givens, MD, 100 mg at 11/05/11 1810;  piperacillin-tazobactam (ZOSYN) IVPB 3.375 g, 3.375 g, Intravenous, Once, Ward Givens, MD;  potassium  chloride SA (K-DUR,KLOR-CON) CR tablet 40 mEq, 40 mEq, Oral, Q6H, Darden Palmer, MD vancomycin (VANCOCIN) IVPB 1000 mg/200 mL premix, 1,000 mg, Intravenous, Once, Ward Givens, MD, 1,000 mg at 11/05/11 1650;  DISCONTD: vancomycin (VANCOCIN) injection 1 g, 1 g, Intravenous, Once, Ward Givens, MD Current outpatient prescriptions:aspirin 325 MG tablet, Take 325 mg by mouth daily., Disp: , Rfl: ;  chlorthalidone (HYGROTON) 25 MG tablet, Take 1 tablet (25 mg total) by mouth daily., Disp: 90 tablet, Rfl: 1;  ferrous sulfate 325 (65 FE) MG tablet, Take 325 mg by mouth 3 (three) times daily with meals. , Disp: , Rfl: ;  levothyroxine (LEVOTHROID) 50 MCG tablet, Take 1 tablet (50 mcg total) by mouth daily., Disp: 90 tablet, Rfl: 1 megestrol (MEGACE) 20 MG tablet, Take 60 mg by mouth daily., Disp: , Rfl: ;  metoprolol (LOPRESSOR) 50 MG tablet, Take 50 mg by mouth 2 (two) times daily. , Disp: , Rfl: ;  albuterol (PROAIR HFA) 108 (90 BASE) MCG/ACT inhaler, Inhale 1 puff into the lungs every 6 (six) hours as needed for wheezing., Disp: 1 Inhaler, Rfl: 2;  fluticasone (FLONASE) 50 MCG/ACT nasal spray, Place 1 each into both nostrils every morning., Disp: , Rfl:  HYDROcodone-acetaminophen (VICODIN) 5-500 MG per tablet, Take 1 tablet by mouth every 8 (eight) hours as needed. As needed for severe pain., Disp: , Rfl: ;  DISCONTD: ferrous sulfate 325 (65 FE) MG tablet, Take 1 tablet (325 mg total) by mouth 3 (three) times daily with meals., Disp: 90 tablet, Rfl: 2;  DISCONTD: metoprolol (LOPRESSOR) 50 MG tablet, Take 1 tablet (50 mg total) by mouth 2 (two) times daily., Disp: 180 tablet, Rfl: 3  Allergies: Allergies as of 11/05/2011 - Review Complete  11/05/2011  Allergen Reaction Noted  . Amlodipine  09/26/2010  . Coconut fatty acids  07/21/2011  . Naproxen sodium Nausea And Vomiting     Past Medical History: Medical Student note reviewed  Family History: Medical Student note reviewed  Social History: Medical  Student note reviewed  Surgical History: Medical Student note reviewed  Review of System: Medical Student note reviewed  Physical Exam: Blood pressure 151/84, pulse 116, temperature 100.8 F (38.2 C), temperature source Oral, resp. rate 20, SpO2 95.00%. BP 151/84  Pulse 116  Temp 100.8 F (38.2 C) (Oral)  Resp 20  SpO2 95%  General Appearance:    Alert, cooperative, no distress, appears stated age  Head:    Normocephalic, without obvious abnormality, atraumatic  Eyes:    PERRL, conjunctiva/corneas clear, EOM's intact, fundi    benign, both eyes  Ears:    Normal TM's and external ear canals, both ears  Nose:   Nares normal, septum midline, mucosa normal, no drainage    or sinus tenderness  Throat:   Dry mucous membrane  , and tongue normal; teeth and gums normal  Neck:   Tender to palpation on the sides and back , symmetrical, trachea midline, no adenopathy;    thyroid:  no enlargement/tenderness/nodules; no carotid   bruit or JVD  Back:     Symmetric, no curvature, ROM normal, no CVA tenderness  Lungs:     Clear to auscultation bilaterally, respirations unlabored  Chest Wall:    No tenderness or deformity   Heart:    Tachycardic, normal rhythm, S1 and S2 normal, no murmur, rub   or gallop  Breast Exam:    No tenderness, masses, or nipple abnormality  Abdomen:     Soft, non-tender, bowel sounds active all four quadrants,    no masses, no organomegaly  Genitalia:    Normal female without lesion, discharge or tenderness  Rectal:    Normal tone, normal prostate, no masses or tenderness;   guaiac negative stool  Extremities:   Redness of right lower leg from ankle about third of the way up her leg. No open lesions, no insect bite marks  , atraumatic, no cyanosis or edema  Pulses:   2+ and symmetric all extremities  Skin:   Skin color, texture, turgor normal, no rashes or lesions  Lymph nodes:   Cervical, supraclavicular, and axillary nodes normal  Neurologic:   CNII-XII intact,  normal strength, sensation and reflexes    throughout    Labs: Reviewed as noted in the Electronic Record  Imaging: Reviewed as noted in the Electronic Record  Assessment & Plan by Problem: 36 -year-old woman with a past medical history significant for hypertension, hypothyroidism presents to the ER with fever, chills, headaches, right leg pain and redness  ,malaise for 1 day.   #Fever and chills: Patient presents to the ER with 1 day history of fever with Tmax of 104 at home .  She also reports some productive cough with yellowish sputum, sore throat  with sudden onset headaches ,neck pain and stiffness, right leg pain with redness since yesterday. She has a sick grandchild with flu like symptoms at home. On presentation her Tmax was 102.9.  On exam, She was noted to have neck tenderness and positive Brudzinski's sign .She was also noted to have a small erythematous , warm area on her right leg likely consistent with cellulitis.  Her chest x-ray did not show any evidence of any acute pulmonary process her UA is still  pending .  Her white cell count is elevated to 25,000.  She had one set of blood cultures drawn in the ER . She was started empirically her and Zosyn for cellulitis and doxycycline for possible tick bite. She definitely has cellulitis as a source of infection but given her history with flu like symptoms and exam there is high suspicion towards meningitis. Other possibility includes sinusitis. Admit to step down unit Stat CT head Stat lumbar puncture and send CSF for gram stain, C and S, cell count with differential , protein and glucose.  Await blood cultures, UA Hydrate with IVF. Treat her empirically with vanc, ceftriaxone and ampicillin for now. D/c zosyn Vanc and ceftriaxone would also provide good coverage for cellulitis Our suspicion towards tick bite is also very low, and therefore would d/c doxycycline likely tomorrow based on her further lab results and hospital course.    #Hypertension: Blood pressure is stable. Continue home meds - chlorthalidone and metoprolol.  # Hyponatremia: likely volume contraction with decreased PO intake. Hydrate with fluids. Recheck BMET.   #Hypoaklemia: Likely 2/2 diuretics, decreased PO intake. Check mg. Replete.   #Acute renal failure: likely pre -renal 2/2 decreased intake. Hydrate with IVF Check urine sodium and creatinine.  Recheck BMET in AM  #Heavy menstrual cycle: Transvaginal ultrasound showed  thickened endometrium  on 6/18. patient is  scheduled for D& C and hysteroscopy on 9/16.   # Microcytic Anemia: likely 2/2 heavy menstrual cycle. Her baseline Hb ~ 10. Her last iron was 75 and ferritin of 80 in 2011. Recheck iron panel  #Chest pain: likely musculoskeletal as part of generalized malaise but given her risk factors of obesity, HTN.  Check one set of CE.   #Hypothyroidism:   Ref. Range 08/13/2011 11:13  TSH Latest Range: 0.350-4.500 uIU/mL 3.317   Continue synthroid  #Sleep apnea: CPAP at night.   # DVT: heparin.   Signed: Mahayla Haddaway 11/05/2011, 7:19 PM

## 2011-11-05 NOTE — ED Provider Notes (Cosign Needed)
History  Scribed for Ward Givens, MD, the patient was seen in room TR07C/TR07C. This chart was scribed by Candelaria Stagers. The patient's care started at 2:28 PM   CSN: [id number]  Arrival date & time 11/05/11  1143   First MD Initiated Contact with Patient 11/05/11 1354      Chief Complaint  Patient presents with  . Influenza     The history is provided by the patient. No language interpreter was used.   Nancy [name] is a 56 y.o. female who presents to the Emergency Department complaining of chills, fever, body aches, and nausea that started yesterday morning.  Her highest fever has been 104.4, which was last night about 8 pm.  She is also experiencing a productive cough with yellow sputum,nausea, sore throat, neck pain, and foot pain and swelling with redness that started last night.  She denies vomiting, diarrhea, or rash. Pt also reports that she has been experiencing menstrual bleeding for about two weeks with her LMP on 09/24/11.  She has seen her OBGYN for the menstrual bleeding and was prescribed megestrol which has helped with the menstrual bleeding somewhat.   She is scheduled for surgery later on 9/16 to have "my uterus scraped" .  She reports feeling weak and dizzy.   OBGYN Dr. Macon Large  PCP is Dr. Eben Burow [facility]   Past Medical History  Diagnosis Date  . Primary Sjogren's syndrome     anti Ro+;ANA>1:1280 in homogen pattern, negative ds DNA/RF/anti Smith/RNP/C3-4 comp/la/jo1/Scleroderma/centromere, neg HIV/ACE/Hep B/C, nl CXR 2/05, schirmer salivary glandtest not done, symptom rx:eye drops/prednisone/plaquenil referral to [facility] rheum, [name] 3/07. saw [name] but stopped 2/2 cost.  . Hypertension     no LVH EKG-7/05, nl M/C ratio 4/07  . Morbid obesity   . Back pain     chronic low back pain L4-5 discectomy:11/99. degenerative thoracic spondylotic changes 4/07  . Obstructive sleep apnea     severe 7/05 RD 161 per hr. /CPAP 18cwp  . Hypothyroidism    09/1998  . Microcytic anemia     09/1998. Hb-10.5/MCV 76. needs ferritin to determine ACD vs IDA  . Menorrhagia   . Uterine bleeding   . Lower extremity edema     echo EF 55-65% w/o evidence of Dias dysfx.,   . Polyarthralgia     (knee/back/ankle) w/dx of fibromyalgia? given by [facility] rheum, knee pain chronic 2/2 obesity, fibromyalgia and Sjogren's.  . Chest pain     neg adenosine myoview.    Past Surgical History  Procedure Date  . Tonsillectomy   . Lumbar fusion 1999  . Tubal ligation     Family History  Problem Relation Age of Onset  . Hypertension Mother   . Cancer Mother   . Cancer Father   . Cancer Sister   . Ovarian cancer      family history  . Breast cancer      family history in 1st degree relatives.  . Thyroid disease Sister     History   Social History  . Marital Status: Married    Spouse Name: N/A    Number of Children: N/A  . Years of Education: N/A   Occupational History  . Not on file.   Social History Main Topics  . Smoking status: Never Smoker   . Smokeless tobacco: Never Used  . Alcohol Use: No  . Drug Use: No  . Sexually Active: Yes    Birth Control/ Protection: Post-menopausal   Other Topics Concern  .  Not on file   Social History Narrative   Her daughter [name]) often comes with her to visits. She also has a grand daughter and great grand daughter(whose name is [place])     OB History    Grav Para Term Preterm Abortions TAB SAB Ect Mult Living                  Review of Systems  Constitutional: Positive for fever and chills.  HENT: Positive for sore throat and neck pain.   Respiratory: Positive for cough.   Cardiovascular: Positive for leg swelling (right lower leg).  Gastrointestinal: Positive for nausea. Negative for vomiting and diarrhea.  Skin: Negative for rash.  Neurological: Positive for weakness.  All other systems reviewed and are negative.    Allergies  Amlodipine; Coconut fatty acids; and Naproxen  sodium  Home Medications   Current Outpatient Rx  Name Route Sig Dispense Refill  . ASPIRIN 325 MG PO TABS Oral Take 325 mg by mouth daily.    . CHLORTHALIDONE 25 MG PO TABS Oral Take 1 tablet (25 mg total) by mouth daily. 90 tablet 1  . FERROUS SULFATE 325 (65 FE) MG PO TABS Oral Take 325 mg by mouth 3 (three) times daily with meals.     Marland Kitchen LEVOTHYROXINE SODIUM 50 MCG PO TABS Oral Take 1 tablet (50 mcg total) by mouth daily. 90 tablet 1  . MEGESTROL ACETATE 20 MG PO TABS Oral Take 60 mg by mouth daily.    Marland Kitchen METOPROLOL TARTRATE 50 MG PO TABS Oral Take 50 mg by mouth 2 (two) times daily.     . ALBUTEROL SULFATE HFA 108 (90 BASE) MCG/ACT IN AERS Inhalation Inhale 1 puff into the lungs every 6 (six) hours as needed for wheezing. 1 Inhaler 2  . HYDROCODONE-ACETAMINOPHEN 5-500 MG PO TABS Oral Take 1 tablet by mouth every 8 (eight) hours as needed. As needed for severe pain.      BP 128/65  Pulse 112  Temp 98.8 F (37.1 C)  Resp 20  SpO2 96%  Vital signs normal except tachycardia   Physical Exam  Nursing note and vitals reviewed. Constitutional: She is oriented to person, place, and time. She appears well-developed and well-nourished. No distress.       Obese   HENT:  Head: Normocephalic and atraumatic.  Right Ear: External ear normal.  Left Ear: External ear normal.  Mouth/Throat: Oropharynx is clear and moist.       Throat is not red  Eyes: Conjunctivae and EOM are normal. Pupils are equal, round, and reactive to light. Right eye exhibits no discharge. Left eye exhibits no discharge.  Neck: Normal range of motion. Neck supple.       Tender cervical spine and paraspinous muscles. Has some  pain on ROM  Cardiovascular: Regular rhythm and normal heart sounds.  Tachycardia present.        tachycardic  Pulmonary/Chest: Effort normal. No respiratory distress. She has no wheezes. She has no rales.       Diminished breath sounds diffusely.   Abdominal: Soft. She exhibits no distension.  There is no tenderness.  Musculoskeletal: Normal range of motion. She exhibits edema.       Redness of right lower leg from ankle about third of the way up her leg.  No open lesions, no insect bite marks.    Neurological: She is alert and oriented to person, place, and time.  Skin: Skin is warm. No rash noted. She is not  diaphoretic.       No rash on trunk, extremity, or palms.    Psychiatric: She has a normal mood and affect. Her behavior is normal.    ED Course  Procedures   Medications  0.9 %  sodium chloride infusion (not administered)    Followed by  0.9 %  sodium chloride infusion (not administered)  doxycycline (VIBRAMYCIN) 100 mg in dextrose 5 % 250 mL IVPB (not administered)  acetaminophen (TYLENOL) tablet 975 mg (not administered)  vancomycin (VANCOCIN) injection 1 g (not administered)  piperacillin-tazobactam (ZOSYN) IVPB 3.375 g (not administered)       DIAGNOSTIC STUDIES: Oxygen Saturation is 96% on room air, normal by my interpretation.    COORDINATION OF CARE:   14:39 Discussed with Fayrene Helper, PA in CDU will transfer there for IV fluids, labs and reassessment. Pt started on doxycycline for possible cellulitis or tick borne illness  14:43 Ordered: CBC with Differential; Comprehensive metabolic panel; Culture, blood (single)  16:25 Discussed with Felicie Morn, NP in CDU, he is going to call [facility] to admit.  Vancomycin and zosyn added to antibiotic regimine.  Results for orders placed during the hospital encounter of 11/05/11  CBC WITH DIFFERENTIAL      Component Value Range   WBC 25.1 (*) 4.0 - 10.5 K/uL   RBC 4.49  3.87 - 5.11 MIL/uL   Hemoglobin 10.6 (*) 12.0 - 15.0 g/dL   HCT 96.0 (*) 45.4 - 09.8 %   MCV 73.1 (*) 78.0 - 100.0 fL   MCH 23.6 (*) 26.0 - 34.0 pg   MCHC 32.3  30.0 - 36.0 g/dL   RDW 11.9  14.7 - 82.9 %   Platelets 212  150 - 400 K/uL   Neutrophils Relative 88 (*) 43 - 77 %   Lymphocytes Relative 8 (*) 12 - 46 %   Monocytes Relative 4  3  - 12 %   Eosinophils Relative 0  0 - 5 %   Basophils Relative 0  0 - 1 %   Neutro Abs 22.1 (*) 1.7 - 7.7 K/uL   Lymphs Abs 2.0  0.7 - 4.0 K/uL   Monocytes Absolute 1.0  0.1 - 1.0 K/uL   Eosinophils Absolute 0.0  0.0 - 0.7 K/uL   Basophils Absolute 0.0  0.0 - 0.1 K/uL   WBC Morphology INCREASED BANDS (>20% BANDS)    COMPREHENSIVE METABOLIC PANEL      Component Value Range   Sodium 131 (*) 135 - 145 mEq/L   Potassium 3.2 (*) 3.5 - 5.1 mEq/L   Chloride 94 (*) 96 - 112 mEq/L   CO2 27  19 - 32 mEq/L   Glucose, Bld 108 (*) 70 - 99 mg/dL   BUN 23  6 - 23 mg/dL   Creatinine, Ser 5.62 (*) 0.50 - 1.10 mg/dL   Calcium 8.9  8.4 - 13.0 mg/dL   Total Protein 8.2  6.0 - 8.3 g/dL   Albumin 3.4 (*) 3.5 - 5.2 g/dL   AST 18  0 - 37 U/L   ALT 9  0 - 35 U/L   Alkaline Phosphatase 60  39 - 117 U/L   Total Bilirubin 1.1  0.3 - 1.2 mg/dL   GFR calc non Af Amer 39 (*) >90 mL/min   GFR calc Af Amer 45 (*) >90 mL/min    Laboratory interpretation all normal except leukocytosis with marked left shift, mild anemia, hyponatremia, hypokalemia, renal insuffic   Dg Chest 2 View  11/05/2011  *RADIOLOGY  REPORT*  Clinical Data: Chest pain.  Shortness of breath.  Fever. Influenza.  CHEST - 2 VIEW  Comparison: Two-view chest x-ray 07/31/2010, 01/02/2008.  Findings: Suboptimal inspiration due to body habitus which accounts for crowded bronchovascular markings at the bases and accentuates the cardiac silhouette.  Taking this into account, cardiomediastinal silhouette unremarkable and unchanged.  Lungs clear.  Bronchovascular markings normal.  Pulmonary vascularity normal.  No pneumothorax.  No pleural effusions.  Stable chronic elevation of the right hemidiaphragm.  Degenerative changes and DISH involving the thoracic spine.  No significant interval change.  IMPRESSION: Suboptimal inspiration.  No acute cardiopulmonary disease.   Original Report Authenticated By: Arnell Sieving, M.D.     Date: 11/05/2011  Rate: 108   Rhythm: sinus tachycardia  QRS Axis: normal  Intervals: normal  ST/T Wave abnormalities: NSTWC  Conduction Disutrbances:none  Narrative Interpretation:   Old EKG Reviewed: changes noted from 01/02/2008    1. Cellulitis of right lower leg   2. Renal insufficiency   3. Anemia   4. Hyponatremia   5. Hypokalemia   6. Leukocytosis    Plan admission  Devoria Albe, MD, FACEP    MDM  I personally performed the services described in this documentation, which was scribed in my presence. The recorded information has been reviewed and considered.  Devoria Albe, MD, Armando Gang        Ward Givens, MD 11/05/11 [phone]

## 2011-11-05 NOTE — ED Notes (Signed)
Lab at bedside

## 2011-11-05 NOTE — Progress Notes (Addendum)
ANTIBIOTIC CONSULT NOTE - INITIAL  Pharmacy Consult for Vancomycin / Rocephin Indication: rule out meningitis / cellulitis  Allergies  Allergen Reactions  . Amlodipine     Lower extremity swelling.  This reaction only occurred when taking GENERIC amlodipine.  She previously tolerated brand name Norvasc well.  . Coconut Fatty Acids     Break out  . Naproxen Sodium Nausea And Vomiting    Says she can take ibuprofen    Patient Measurements: 131 kg  Labs:  Basename 11/05/11 2051 11/05/11 1500  WBC -- 25.1*  HGB -- 10.6*  PLT -- 212  LABCREA 163.27 --  CREATININE -- 1.46*    Microbiology: No results found for this or any previous visit (from the past 720 hour(s)).  Medical History: Past Medical History  Diagnosis Date  . Primary Sjogren's syndrome     anti Ro+;ANA>1:1280 in homogen pattern, negative ds DNA/RF/anti Smith/RNP/C3-4 comp/la/jo1/Scleroderma/centromere, neg HIV/ACE/Hep B/C, nl CXR 2/05, schirmer salivary glandtest not done, symptom rx:eye drops/prednisone/plaquenil referral to [facility] rheum, [name] 3/07. saw [name] but stopped 2/2 cost.  . Hypertension     no LVH EKG-7/05, nl M/C ratio 4/07  . Morbid obesity   . Back pain     chronic low back pain L4-5 discectomy:11/99. degenerative thoracic spondylotic changes 4/07  . Obstructive sleep apnea     severe 7/05 RD 161 per hr. /CPAP 18cwp  . Hypothyroidism     09/1998  . Microcytic anemia     09/1998. Hb-10.5/MCV 76. needs ferritin to determine ACD vs IDA  . Menorrhagia   . Uterine bleeding   . Lower extremity edema     echo EF 55-65% w/o evidence of Dias dysfx.,   . Polyarthralgia     (knee/back/ankle) w/dx of fibromyalgia? given by [facility] rheum, knee pain chronic 2/2 obesity, fibromyalgia and Sjogren's.  . Chest pain     neg adenosine myoview.   Assessment: 56 year old female weighing 131 kg admitted with fever, headache, and leg pain.  Also with a 1 day history of fevers with a Tmax of  104.  Started on doxycycline for possible tick borne illness with erythema of right ankle.  Beginning Vancomycin / Rocephin for rule out meningitis.  Scr = 1.46  Goal of Therapy:  Vancomycin trough level = 15 to 20 mcg/dl Appropriate Rocephin dosing  Plan:  1) Rocephin 2 Grams iv Q 12 hours (meningitis dose) 2) Vancomycin 2000 mg iv x 1 dose now 3) then Vancomycin 1750 mg iv Q 24 hours 4) Follow up plan, renal function, cultures.  Thank you. Okey Regal, PharmD [phone]  11/05/2011,9:50 PM

## 2011-11-05 NOTE — ED Notes (Signed)
Pt returned from xray

## 2011-11-06 DIAGNOSIS — L02619 Cutaneous abscess of unspecified foot: Secondary | ICD-10-CM

## 2011-11-06 DIAGNOSIS — N179 Acute kidney failure, unspecified: Secondary | ICD-10-CM

## 2011-11-06 DIAGNOSIS — R509 Fever, unspecified: Secondary | ICD-10-CM

## 2011-11-06 LAB — CBC
HCT: 30.7 % — ABNORMAL LOW (ref 36.0–46.0)
Hemoglobin: 10 g/dL — ABNORMAL LOW (ref 12.0–15.0)
MCH: 23.5 pg — ABNORMAL LOW (ref 26.0–34.0)
MCHC: 32.6 g/dL (ref 30.0–36.0)
MCV: 72.1 fL — ABNORMAL LOW (ref 78.0–100.0)
Platelets: 193 10*3/uL (ref 150–400)
RBC: 4.26 MIL/uL (ref 3.87–5.11)
RDW: 14.9 % (ref 11.5–15.5)
WBC: 26.1 10*3/uL — ABNORMAL HIGH (ref 4.0–10.5)

## 2011-11-06 LAB — COMPREHENSIVE METABOLIC PANEL WITH GFR
ALT: 8 U/L (ref 0–35)
AST: 16 U/L (ref 0–37)
Albumin: 3 g/dL — ABNORMAL LOW (ref 3.5–5.2)
Alkaline Phosphatase: 75 U/L (ref 39–117)
BUN: 17 mg/dL (ref 6–23)
CO2: 25 meq/L (ref 19–32)
Calcium: 8.6 mg/dL (ref 8.4–10.5)
Chloride: 102 meq/L (ref 96–112)
Creatinine, Ser: 0.98 mg/dL (ref 0.50–1.10)
GFR calc Af Amer: 72 mL/min — ABNORMAL LOW
GFR calc non Af Amer: 62 mL/min — ABNORMAL LOW
Glucose, Bld: 220 mg/dL — ABNORMAL HIGH (ref 70–99)
Potassium: 4 meq/L (ref 3.5–5.1)
Sodium: 136 meq/L (ref 135–145)
Total Bilirubin: 0.6 mg/dL (ref 0.3–1.2)
Total Protein: 7.7 g/dL (ref 6.0–8.3)

## 2011-11-06 LAB — IRON AND TIBC
Iron: 10 ug/dL — ABNORMAL LOW (ref 42–135)
Saturation Ratios: 5 % — ABNORMAL LOW (ref 20–55)
TIBC: 198 ug/dL — ABNORMAL LOW (ref 250–470)
UIBC: 188 ug/dL (ref 125–400)

## 2011-11-06 LAB — FERRITIN: Ferritin: 186 ng/mL (ref 10–291)

## 2011-11-06 MED ORDER — MEGESTROL ACETATE 40 MG PO TABS
60.0000 mg | ORAL_TABLET | Freq: Every day | ORAL | Status: DC
  Administered 2011-11-06 – 2011-11-07 (×2): 60 mg via ORAL
  Filled 2011-11-06 (×2): qty 1

## 2011-11-06 MED ORDER — VANCOMYCIN HCL 1000 MG IV SOLR
1250.0000 mg | Freq: Two times a day (BID) | INTRAVENOUS | Status: DC
  Filled 2011-11-06: qty 1250

## 2011-11-06 MED ORDER — SODIUM CHLORIDE 0.9 % IV SOLN
2.0000 g | INTRAVENOUS | Status: DC
  Filled 2011-11-06 (×3): qty 2000

## 2011-11-06 MED ORDER — FERROUS SULFATE 325 (65 FE) MG PO TABS
325.0000 mg | ORAL_TABLET | Freq: Three times a day (TID) | ORAL | Status: DC
  Administered 2011-11-06 – 2011-11-07 (×4): 325 mg via ORAL
  Filled 2011-11-06 (×6): qty 1

## 2011-11-06 MED ORDER — DOXYCYCLINE HYCLATE 100 MG PO TABS
100.0000 mg | ORAL_TABLET | Freq: Two times a day (BID) | ORAL | Status: DC
  Administered 2011-11-06: 100 mg via ORAL
  Filled 2011-11-06 (×2): qty 1

## 2011-11-06 NOTE — Progress Notes (Signed)
Pt transferred from ED via stretcher. Moved to new bed with assistance. VVS, pt OOB to bathroom with walker and complaining of mild generalized pain. Will continue to monitor.

## 2011-11-06 NOTE — Care Management Note (Signed)
    Page 1 of 1   11/06/2011     5:12:35 PM   CARE MANAGEMENT NOTE 11/06/2011  Patient:  Nancy [name], Nancy [name]   Account Number:  [id number]  Date Initiated:  11/06/2011  Documentation initiated by:  Donn Pierini  Subjective/Objective Assessment:   Pt admitted with fever, r/o mennigitis     Action/Plan:   PTA pt lived at home, uses CPAP has w/c   Anticipated DC Date:  11/08/2011   Anticipated DC Plan:  HOME/SELF CARE      DC Planning Services  CM consult      Choice offered to / List presented to:             Status of service:  In process, will continue to follow Medicare Important Message given?   (If response is "NO", the following Medicare IM given date fields will be blank) Date Medicare IM given:   Date Additional Medicare IM given:    Discharge Disposition:    Per UR Regulation:  Reviewed for med. necessity/level of care/duration of stay  If discussed at Long Length of Stay Meetings, dates discussed:    Comments:  11/06/11- 1645- Donn Pierini RN, BSN [phone] Received referral for DME-CPAP for home- spoke with pt at bedside prior to tx to 6N- per conversation with pt- she states that she has a CPAP at home through [facility]- she has had it for about 2 yrs.  and it is working without any trouble. No further need for any DME at home at this time- contact 1st contact for treatment team to let them know order entry error- MD to f/u along with RN at bedside- MD might have meant to order  CPAP for nighttime use for pt while she is here in hospital

## 2011-11-06 NOTE — Progress Notes (Signed)
Inpatient Diabetes Program Recommendations  AACE/ADA: New Consensus Statement on Inpatient Glycemic Control (2013)  Target Ranges:  Prepandial:   less than 140 mg/dL      Peak postprandial:   less than 180 mg/dL (1-2 hours)      Critically ill patients:  140 - 180 mg/dL   Reason for Visit: Hypeglycemia with steroid therapy and A1C indicative of "at risk for DM"  Inpatient Diabetes Program Recommendations HgbA1C: 6.2% on 04/13/2011 (Pre-diabetes or "at risk for  diabetes")  May want to check another A1C Diet: Patient would benefit from carbohydrate modifed diet.  Note: Thank you, Lenor Coffin, RN, CNS, Diabetes Coordinator [phone])

## 2011-11-06 NOTE — ED Provider Notes (Signed)
Pt seen by me, see CDU notes for PA  Ward Givens, MD 11/06/11 [phone]

## 2011-11-06 NOTE — Consult Note (Signed)
[facility]  Total days of antibiotics:2        Day 2: Ampicillin        Day 2: Ceftriaxone        Day 2: Vancomycin                                                                                     Day 2: Doxycycline       Reason for Consult: Meningitis and RLE cellulitis  Referring Physician: Burns Spain, MD   Active Problems:  Fever and chills   . sodium chloride  1,000 mL Intravenous Once  . acetaminophen  975 mg Oral Once  . ampicillin (OMNIPEN) IV  1 g Intravenous Q6H  . aspirin  325 mg Oral Daily  . cefTRIAXone (ROCEPHIN)  IV  2 g Intravenous Q12H  . dexamethasone  10 mg Intravenous Q6H  . doxycycline  100 mg Oral Q12H  . doxycycline (VIBRAMYCIN) IV  100 mg Intravenous Once  . ferrous sulfate  325 mg Oral TID WC  . heparin  5,000 Units Subcutaneous Q8H  . levothyroxine  50 mcg Oral QAC breakfast  . megestrol  60 mg Oral Daily  . metoprolol  50 mg Oral BID  . potassium chloride  40 mEq Oral Q6H  . vancomycin  1,750 mg Intravenous Q24H  . vancomycin  2,000 mg Intravenous Once  . vancomycin  1,000 mg Intravenous Once  . DISCONTD: aspirin EC  81 mg Oral Daily  . DISCONTD: chlorthalidone  25 mg Oral Daily  . DISCONTD: piperacillin-tazobactam (ZOSYN)  IV  3.375 g Intravenous Once  . DISCONTD: vancomycin  1 g Intravenous Once   HPI: LANITA STAMMEN is a 56 y.o. female PMH of HTN, hypothyroidism, morbid obesity weighing 131 kg and primary Sjogren's syndrome p/w one day histoy of fevers with a Tmax of 104, myalgia, headache, facial pain,neck pain associated with productive cough with yellow sputum and nausea. Pt's family endorses that she does have similar problem every year around this time of the year. Pt was also c/o of  foot pain and swelling with redness that started in the same time. Pt denies any trauma on the leg. In the ER, started on ampicillin, vancomycin and rocephin for possible meningitis and also started on doxycycline for  possible tick borne illness with swelling and erythema of right ankle. CT head  Showed potential Chiari 1 malformation.  Radiology recommended for MRI to rule out acute intracranial process prior to obtaining LP. But MRI was cancelled since pt was refused two times due to claustrophobia. Ct head also shows "Chronic bilateral mastoid effusions". Pt denies vomiting, diarrhea, or rash. No recent sick contacts or travel outside to [place].  Review of Systems: Pertinent items are noted in HPI.  Past Medical History  Diagnosis Date  . Primary Sjogren's syndrome     anti Ro+;ANA>1:1280 in homogen pattern, negative ds DNA/RF/anti Smith/RNP/C3-4 comp/la/jo1/Scleroderma/centromere, neg HIV/ACE/Hep B/C, nl CXR 2/05, schirmer salivary glandtest not done, symptom rx:eye drops/prednisone/plaquenil referral to [facility] rheum, [name] 3/07. saw [name] but stopped 2/2 cost.  . Hypertension     no LVH EKG-7/05, nl  M/C ratio 4/07  . Morbid obesity   . Back pain     chronic low back pain L4-5 discectomy:11/99. degenerative thoracic spondylotic changes 4/07  . Obstructive sleep apnea     severe 7/05 RD 161 per hr. /CPAP 18cwp  . Hypothyroidism     09/1998  . Microcytic anemia     09/1998. Hb-10.5/MCV 76. needs ferritin to determine ACD vs IDA  . Menorrhagia   . Uterine bleeding   . Lower extremity edema     echo EF 55-65% w/o evidence of Dias dysfx.,   . Polyarthralgia     (knee/back/ankle) w/dx of fibromyalgia? given by [facility] rheum, knee pain chronic 2/2 obesity, fibromyalgia and Sjogren's.  . Chest pain     neg adenosine myoview.    Past Surgical History  Procedure Date  . Tonsillectomy   . Lumbar fusion 1999  . Tubal ligation     History  Substance Use Topics  . Smoking status: Never Smoker   . Smokeless tobacco: Never Used  . Alcohol Use: No    Family History  Problem Relation Age of Onset  . Hypertension Mother   . Cancer Mother   . Cancer Father   . Cancer Sister   .  Ovarian cancer      family history  . Breast cancer      family history in 1st degree relatives.  . Thyroid disease Sister    Allergies  Allergen Reactions  . Amlodipine     Lower extremity swelling.  This reaction only occurred when taking GENERIC amlodipine.  She previously tolerated brand name Norvasc well.  . Coconut Fatty Acids     Break out  . Naproxen Sodium Nausea And Vomiting    Says she can take ibuprofen     OBJECTIVE: Blood pressure 133/64, pulse 107, temperature 98.4 F (36.9 C), temperature source Oral, resp. rate 23, height 5' 2.99" (1.6 m), weight 131 kg (288 lb 12.8 oz), SpO2 97.00%.  General: resting in bed, NAD HEENT: PERRL, EOMI, no scleral icterus Cardiac: RRR, no rubs, murmurs or gallops Pulm: clear to auscultation bilaterally, moving normal volumes of air Abd: soft, nontender, nondistended, BS present Ext: warm and well perfused upper extremities. Mild effusion and erythematous area from mid foot to above ankle. Warm but non tender. No open wound, discharge or bleeding. Neuro: alert and oriented X3, cranial nerves II-XII grossly intact  Microbiology: Recent Results (from the past 240 hour(s))  CULTURE, BLOOD (SINGLE)     Status: Normal (Preliminary result)   Collection Time   11/05/11  2:00 PM      Component Value Range Status Comment   Specimen Description BLOOD ARM RIGHT   Final    Special Requests     Final    Value: BOTTLES DRAWN AEROBIC AND ANAEROBIC AERO 10CC,ANAE 5CC   Culture  Setup Time 11/05/2011 20:44   Final    Culture     Final    Value:        BLOOD CULTURE RECEIVED NO GROWTH TO DATE CULTURE WILL BE HELD FOR 5 DAYS BEFORE ISSUING A FINAL NEGATIVE REPORT   Report Status PENDING   Incomplete    Lab results: Basic Metabolic Panel:  Lab 11/06/11 1610 11/05/11 1500  NA 136 131*  K 4.0 3.2*  CL 102 94*  CO2 25 27  GLUCOSE 220* 108*  BUN 17 23  CREATININE 0.98 1.46*  CALCIUM 8.6 8.9  MG -- --  PHOS -- --   Liver  Function  Tests:  Lab 11/06/11 0642 11/05/11 1500  AST 16 18  ALT 8 9  ALKPHOS 75 60  BILITOT 0.6 1.1  PROT 7.7 8.2  ALBUMIN 3.0* 3.4*   CBC:  Lab 11/06/11 0642 11/05/11 1500  WBC 26.1* 25.1*  NEUTROABS -- 22.1*  HGB 10.0* 10.6*  HCT 30.7* 32.8*  MCV 72.1* 73.1*  PLT 193 212  Anemia Panel:  Lab 11/05/11 2127  VITAMINB12 --  FOLATE --  FERRITIN 186  TIBC 198*  IRON 10*  RETICCTPCT --   Imaging results:  Dg Chest 2 View  11/05/2011  IMPRESSION: Suboptimal inspiration.  No acute cardiopulmonary disease.     Ct Head Wo Contrast  11/05/2011  IMPRESSION: 1.  Findings, as above, concerning for potential Chiari 1 malformation.  No associated hydrocephalus at this time. This may be an etiology for the patient's headache.  This could be confirmed with a brain MRI. 2.  No other potentially acute findings in the brain. 3.  Chronic bilateral mastoid effusions, similar to prior study 06/08/2008.    Assessment & Plan by Problem:  Ms.Denison is a 56 y.o. female PMH of HTN, hypothyroidism, morbid obesity weighing 131 kg and primary Sjogren's syndrome p/w one day histoy of fevers, myalgia, headache, neck pain associated with productive cough with yellow sputum and nausea. Rt foot pain and swelling with redness that started in the same time. Possible meningitis and cellulitis of right leg.In the ER , started on ampicillin, vancomycin and rocephin for possible meningitis and also started on doxycycline for possible tick borne diseases  1.Cellulitis of right lower leg: Swollen, warmth and erythematous right ankle, possible cellulitis. Or may be acute gouty flare. No h/o of trauma or ulcer. In the ED,started on doxycycline for possible tick borne illness. Pt's pain improved.  1. D/c Doxycyline, less suspicion of Tick borne diseases 2. F/u pending Blood culture 3. Arthrocentesis if symptoms get worse  2. Possible Meningitis: Acute onset of fever, neck pain and headache, made concerned for meningitis.  Headache and facial pain could be due to sinusitis. In the ER , started on ampicillin, vancomycin and rocephin for possible meningitis. Pt is afebrile,asymptomatic now and noobvious neurological deficits. Ct head on 8/29 shows "Chronic bilateral mastoid effusions"and also potential Chiari 1 malformation may be cause of patient's headache.  1. D/c Vancomycin, ampicillin less suspicion of meningitis 2. D/c Decadron  3. Continue ceftriaxone    Signed: Junious Silk 11/06/2011, 10:14 AM   INFECTIOUS DISEASE ATTENDING ADDENDUM:     [facility]   Date: 11/06/2011  Patient name: ZAKARIAH DEJARNETTE  Medical record number: [id number]  Date of birth: 1955/03/10    This patient has been seen and discussed with the house staff. Please see their note for complete details. I concur with their findings with the following additions/corrections:  56 year old with admission with febrile illness, right lower extremity celllulitis, fever headache and neck pain. She has responded dramatically to amp/rocpehin/vanco and decadron.  Clinically I think that this patient has a cellulitis in right ankle, less likely septic arthritis. Gout is also a possibility here. She has chronic mastoid effusions on CT scan and some of her yearly flares of illness could be related to allergic sinusitis and possible secondary bacterial infection. Her HA are largely when she has fevers or coughs.  I dont think we need LP at this point  I would narrow to rx for cellulitis with rocephin alone since group A strep seems more likely  given appearance.   Would check uric acid level and consider MRI of the joint.  Would also check HIV ab.  Acey Lav 11/06/2011, 7:47 PM

## 2011-11-06 NOTE — Progress Notes (Addendum)
Medical Student Daily Progress Note  Subjective: [name] has done very well overnight, no acute events. Last fever was 102 in the ED. She reports that her head and neck pain has completely resolved and that she feels much better. She also reports that her right ankle is much less painful. She denies any SOB, chest pain, or LE swelling.  After obtaining more history from the patient's husband, it seems that the patient presents similarly every year, with cough and sore throat that results in head and neck pain. She also gets this pain with coughing and sneezing. The patient also does have a history of recurrent swelling of her left and right ankles that becomes painful, but endorses that her current pain and redness is more severe than in the past. Objective: Vital signs in last 24 hours: Filed Vitals:   11/05/11 2100 11/05/11 2200 11/05/11 2300 11/06/11 0350  BP:  109/58 147/68 133/64  Pulse:   104 107  Temp:    98.4 F (36.9 C)  TempSrc:    Oral  Resp:   20 23  Height: 5' 2.99" (1.6 m)     Weight: 131 kg (288 lb 12.8 oz)     SpO2:   99% 97%   Weight change:   Intake/Output Summary (Last 24 hours) at 11/06/11 1137 Last data filed at 11/06/11 2956  Gross per 24 hour  Intake   1000 ml  Output   1575 ml  Net   -575 ml   Physical Exam: General appearance: alert, cooperative and no distress Head: Normocephalic, without obvious abnormality, atraumatic, No sinus tenderness to palpation. Eyes: Anicteric, PERRL, EOMI Neck: Non-tender to palpation Lungs: clear to auscultation bilaterally Heart: regular rate and rhythm, S1, S2 normal, no murmur, click, rub or gallop Extremities: area of erythema over right ankle is reduced in size, and much less tender than yesterday. Still warm to touch and right ankle is tender to deep palpation Pulses: 2+ and symmetric Lab Results: Basic Metabolic Panel:  Lab 11/06/11 2130 11/05/11 1500  NA 136 131*  K 4.0 3.2*  CL 102 94*  CO2 25 27    GLUCOSE 220* 108*  BUN 17 23  CREATININE 0.98 1.46*  CALCIUM 8.6 8.9  MG -- --  PHOS -- --   Liver Function Tests:  Lab 11/06/11 0642 11/05/11 1500  AST 16 18  ALT 8 9  ALKPHOS 75 60  BILITOT 0.6 1.1  PROT 7.7 8.2  ALBUMIN 3.0* 3.4*    CBC:  Lab 11/06/11 0642 11/05/11 1500  WBC 26.1* 25.1*  NEUTROABS -- 22.1*  HGB 10.0* 10.6*  HCT 30.7* 32.8*  MCV 72.1* 73.1*  PLT 193 212    Anemia Panel:  Lab 11/05/11 2127  VITAMINB12 --  FOLATE --  FERRITIN 186  TIBC 198*  IRON 10*  RETICCTPCT --    Urinalysis    Component Value Date/Time   COLORURINE YELLOW 05/14/2009 1820   APPEARANCEUR CLEAR 05/14/2009 1820   LABSPEC 1.022 05/14/2009 1820   PHURINE 6.0 05/14/2009 1820   GLUCOSEU NEGATIVE 01/02/2008 1042   HGBUR NEGATIVE 01/02/2008 1042   HGBUR negative 07/29/2006 0856   BILIRUBINUR NEG 05/14/2009 1820   KETONESUR NEG mg/dL 10/13/5782 6962   PROTEINUR NEG mg/dL 11/12/2839 3244   UROBILINOGEN 1 05/14/2009 1820   NITRITE NEG 05/14/2009 1820   LEUKOCYTESUR NEG 05/14/2009 1820     Micro Results: Recent Results (from the past 240 hour(s))  CULTURE, BLOOD (SINGLE)     Status: Normal (Preliminary  result)   Collection Time   11/05/11  2:00 PM      Component Value Range Status Comment   Specimen Description BLOOD ARM RIGHT   Final    Special Requests     Final    Value: BOTTLES DRAWN AEROBIC AND ANAEROBIC AERO 10CC,ANAE 5CC   Culture  Setup Time 11/05/2011 20:44   Final    Culture     Final    Value:        BLOOD CULTURE RECEIVED NO GROWTH TO DATE CULTURE WILL BE HELD FOR 5 DAYS BEFORE ISSUING A FINAL NEGATIVE REPORT   Report Status PENDING   Incomplete      Medications: I have reviewed the patient's current medications. Scheduled Meds:   . sodium chloride  1,000 mL Intravenous Once  . acetaminophen  975 mg Oral Once  . aspirin  325 mg Oral Daily  . cefTRIAXone (ROCEPHIN)  IV  2 g Intravenous Q12H  . doxycycline (VIBRAMYCIN) IV  100 mg Intravenous Once  . ferrous sulfate  325  mg Oral TID WC  . heparin  5,000 Units Subcutaneous Q8H  . levothyroxine  50 mcg Oral QAC breakfast  . megestrol  60 mg Oral Daily  . metoprolol  50 mg Oral BID  . vancomycin  2,000 mg Intravenous Once  . vancomycin  1,000 mg Intravenous Once  . DISCONTD: ampicillin (OMNIPEN) IV  1 g Intravenous Q6H  . DISCONTD: ampicillin (OMNIPEN) IV  2 g Intravenous Q4H  . DISCONTD: aspirin EC  81 mg Oral Daily  . DISCONTD: chlorthalidone  25 mg Oral Daily  . DISCONTD: dexamethasone  10 mg Intravenous Q6H  . DISCONTD: doxycycline  100 mg Oral Q12H  . DISCONTD: piperacillin-tazobactam (ZOSYN)  IV  3.375 g Intravenous Once  . DISCONTD: potassium chloride  40 mEq Oral Q6H  . DISCONTD: vancomycin  1,250 mg Intravenous Q12H  . DISCONTD: vancomycin  1,750 mg Intravenous Q24H  . DISCONTD: vancomycin  1 g Intravenous Once   Continuous Infusions:   . sodium chloride 1,000 mL (11/05/11 1645)  . sodium chloride 150 mL/hr at 11/06/11 0618   PRN Meds:.acetaminophen, acetaminophen, albuterol, alum & mag hydroxide-simeth, LORazepam, morphine injection, ondansetron (ZOFRAN) IV, ondansetron Assessment/Plan: Fever chills, neck pain: The patient had CT last night prior to LP that showed possible Chiari I malformation, which radiology wanted to verify by MRI prior to obtaining an LP. The patient refused MRI after counseling x 2 because she becomes very claustrophobic. The patient's clinical picture is very much improved, and she has no head and neck pain or stiffness. A more complete history from the patient's husband reveals that she chronically has head and neck pain that is particularly exacerbated by coughing, which she periodically has every around this time. It is possible that the patient does in fact have a Chiari malformation, which is typically exacerbated by coughing and valsalva, and that this is the etiology of her head and neck pain when she becomes ill for other reasons (in this case: cellulitis +/-  allergies). Our suspicion for meningitis at this time is much lower.  --ID was consulted and they agree that meningitis much less likely. --Will d/c vanc, ampicillin, and dexamethasone per ID recommendations, given low suspicion for meningitis. --Will continue ceftriaxone for cellulitis. --Will continue to follow clinical picture --Will recommend to the patient an open MRI in the outpatient setting to verify Chiari I malformation. --Blood culture X1 negative to date. Will obtain another to ensure no lingering bacteremia.  Right Leg Cellulitis: The patient's ankle has improved with antibiotics and steroids as above, with smaller area of erythema and reduced tenderness. The patient still complains of joint tenderness to palpation. Differential still includes cellulitis, but also considering septic joint vs. Gout. Exact process unclear given steroid usage, which likely calmed much of the inflammation.  --Continue Ceftriaxone as above, for staph and strep coverage --Will D/C dexamethasone and follow her condition clinically in the next 24 hours. If she develops effusion, will likely tap the joint for fluid analysis.   Acute Kidney Injury w/ Hyponatremia, Hypokalemia: Creatinine from 1.46-->0.98 with fluids. FeNa was suggestive of prerenal insult. This has resolved.  -Will D/C IV fluids, as the patient is tolerating PO.  Anemia: The Patient's H/H is 10.6/32.8 with a MCV of 73.1, representing a microcytic anemia. These values are comparable with a CBC from 2010 indicating a chronic anemia, likely iron deficient secondary to her menorrhagia. Iron panel suggestive of anemia of chronic disease, likely from Sjogrens. --H/H stable, will watch CBC.  Diet: Regular  VTE: Heparin   Active Problems:  Fever and chills   LOS: 1 day   This is a Psychologist, occupational Note.  The care of the patient was discussed with Dr. Dorthula Rue and the assessment and plan formulated with their assistance.  Please see their  attached note for official documentation of the daily encounter.  Nancy [name] 11/06/2011, 11:37 AM  Resident Co-sign Daily Note: I have seen the patient and reviewed the daily progress note by Nancy [name] and discussed the care of the patient with them.  See below for documentation of my findings, assessment, and plans.  Subjective: She reports feeling better- decreased RLE pain and redness. " I am ready to go home". Denies any neck pain, stiffness, headache.   Objective: Vital signs in last 24 hours: Filed Vitals:   11/05/11 2200 11/05/11 2300 11/06/11 0350 11/06/11 1149  BP: 109/58 147/68 133/64   Pulse:  104 107   Temp:   98.4 F (36.9 C) 98.3 F (36.8 C)  TempSrc:   Oral   Resp:  20 23   Height:      Weight:      SpO2:  99% 97%    Physical Exam: BP 133/64  Pulse 107  Temp 98.3 F (36.8 C) (Oral)  Resp 23  Ht 5' 2.99" (1.6 m)  Wt 288 lb 12.8 oz (131 kg)  BMI 51.17 kg/m2  SpO2 97%.  BP 133/64  Pulse 107  Temp 98.3 F (36.8 C) (Oral)  Resp 23  Ht 5' 2.99" (1.6 m)  Wt 288 lb 12.8 oz (131 kg)  BMI 51.17 kg/m2  SpO2 97%  General Appearance:    Alert, cooperative, no distress, appears stated age  Head:    Normocephalic, without obvious abnormality, atraumatic  Eyes:    PERRL, conjunctiva/corneas clear, EOM's intact, fundi    benign, both eyes  Ears:    Normal TM's and external ear canals, both ears  Nose:   Nares normal, septum midline, mucosa normal, no drainage    or sinus tenderness  Throat:   Lips, mucosa, and tongue normal; teeth and gums normal  Neck:   Supple, symmetrical, trachea midline, no adenopathy;    thyroid:  no enlargement/tenderness/nodules; no carotid   bruit or JVD  Back:     Symmetric, no curvature, ROM normal, no CVA tenderness  Lungs:     Clear to auscultation bilaterally, respirations unlabored  Chest Wall:    No tenderness  or deformity   Heart:    Regular rate and rhythm, S1 and S2 normal, no murmur, rub   or gallop    Breast Exam:    No tenderness, masses, or nipple abnormality  Abdomen:     Soft, non-tender, bowel sounds active all four quadrants,    no masses, no organomegaly  Genitalia:    Normal female without lesion, discharge or tenderness  Rectal:    Normal tone, normal prostate, no masses or tenderness;   guaiac negative stool  Extremities:   Improved RLLE swelling , redness and pain   Pulses:   2+ and symmetric all extremities  Skin:   Skin color, texture, turgor normal, no rashes or lesions  Lymph nodes:   Cervical, supraclavicular, and axillary nodes normal  Neurologic:   CNII-XII intact, normal strength, sensation and reflexes    throughout                                                            Lab Results: Reviewed and documented in Electronic Record Micro Results: Reviewed and documented in Electronic Record Studies/Results: Reviewed and documented in Electronic Record Medications: I have reviewed the patient's current medications. Scheduled Meds:   . sodium chloride  1,000 mL Intravenous Once  . acetaminophen  975 mg Oral Once  . aspirin  325 mg Oral Daily  . cefTRIAXone (ROCEPHIN)  IV  2 g Intravenous Q12H  . doxycycline (VIBRAMYCIN) IV  100 mg Intravenous Once  . ferrous sulfate  325 mg Oral TID WC  . heparin  5,000 Units Subcutaneous Q8H  . levothyroxine  50 mcg Oral QAC breakfast  . megestrol  60 mg Oral Daily  . metoprolol  50 mg Oral BID  . vancomycin  2,000 mg Intravenous Once  . vancomycin  1,000 mg Intravenous Once  . DISCONTD: ampicillin (OMNIPEN) IV  1 g Intravenous Q6H  . DISCONTD: ampicillin (OMNIPEN) IV  2 g Intravenous Q4H  . DISCONTD: aspirin EC  81 mg Oral Daily  . DISCONTD: chlorthalidone  25 mg Oral Daily  . DISCONTD: dexamethasone  10 mg Intravenous Q6H  . DISCONTD: doxycycline  100 mg Oral Q12H  . DISCONTD: piperacillin-tazobactam (ZOSYN)  IV  3.375 g Intravenous Once  . DISCONTD: potassium chloride  40 mEq Oral Q6H  . DISCONTD:  vancomycin  1,250 mg Intravenous Q12H  . DISCONTD: vancomycin  1,750 mg Intravenous Q24H  . DISCONTD: vancomycin  1 g Intravenous Once   Continuous Infusions:   . sodium chloride 1,000 mL (11/05/11 1645)  . sodium chloride 150 mL/hr at 11/06/11 1100   PRN Meds:.acetaminophen, acetaminophen, albuterol, alum & mag hydroxide-simeth, LORazepam, morphine injection, ondansetron (ZOFRAN) IV, ondansetron Assessment/Plan: 56 -year-old woman with a past medical history significant for hypertension, hypothyroidism presents to the ER with fever, chills, headaches, right leg pain and redness ,malaise for 1 day.   #Fever and chills: likely 2/2 cellulitis. Patient presents to the ER with 1 day history of fever with Tmax of 104 at home, headache, neck pain, right leg pain and swelling  . With her initial presentation, there was a concern for meningitis but that was felt to be very less likely on re- exam. Her CT head showed probable [place] Chiari I malformation with low lying cerebellar tonsils due to which the radiologist  recommended getting MRI to confirm the diagnosis before preceding with LP but patient got very anxious and refused to get the imaging and  LP could not be obtained last night. But based on our repeat exam this AM, our suspicion for meningitis is anyways less likely, so we will  hold off on LP. We also consulted ID and Dr. Daiva Eves agrees with [place]. She presented with R LLE cellulitis which has improved significantly . Dr. Zenaida Niece dam suggested to explore for possible gouty or septic arthritis by looking at joint effusion on MRI.  - Hold off on LP. - Continue ceftriaxone for LLE cellulitis  - Observe off steroids to look for any flaring of joints. Hold off an further imaging.  - Await blood cultures.  # Faythe Casa I malformation : Likely the cause of her periodic headaches that worsen with cough, sneezing etc. will get outpatient open MRI.   #Hypertension: Blood pressure is stable.  Continue home  meds - metoprolol. Restart chlorthalidone tomorrow.   #Heavy menstrual cycle: Transvaginal ultrasound showed thickened endometrium on 6/18. patient is scheduled for D& C and hysteroscopy on 9/16.  - Continue megace  # Microcytic Anemia: likely 2/2 heavy menstrual cycle. Her baseline Hb ~ 10. Her recent iron was 10 and ferritin of 186 when it was checked yesterday.    #Hypothyroidism:   Ref. Range  08/13/2011 11:13   TSH  Latest Range: 0.350-4.500 uIU/mL  3.317   Continue synthroid   #Sleep apnea: CPAP at night.   # DVT: heparin.       LOS: 1 day   Nancy [name] 11/06/2011, 1:53 PM

## 2011-11-06 NOTE — Progress Notes (Signed)
Utilization review completed.  

## 2011-11-06 NOTE — Progress Notes (Signed)
Pharmacy: Vancomycin  56yof initiated on vancomycin for r/o meningitis and RLE cellulitis. She came in with ARF and sCr 1.46 but this is resolving with hydration and sCr now down to 0.98. Will adjust vancomycin dose accordingly. She received a total of 3gm vancomycin on 8/29.  Plan: 1) Change vancomycin to 1250mg  IV q12 2) Follow up renal function, trough at steady state

## 2011-11-06 NOTE — H&P (Signed)
Internal Medicine Teaching Service Attending Note Date: 11/06/2011  Patient name: Nancy [name]  Medical record number: [id number]  Date of birth: [birth year]   I have seen and evaluated Nancy [name] and discussed their care with the Residency Team. Please see Nancy [name] H&P for full details. Nancy [name] was admitted for a one-day history of a temperature to 104, headache, neck pain, myalgias, and chills. Her CT of the head showed possible Chiari 1 malformation and she was unable to have an LP to assess for meningitis. MRI was suggested prior to an LP but due to claustrophobia she was not able to get an MRI. She was started on empiric antibiotics including Vanc & ceftriaxone along with steroids. She was also started on doxy for possible tick borne illnesses, although this was thought to be less likely.  All of her symptoms have resolved the morning after the admission. We got additional history today from family members who state that this happens every year at this time. Theyy treat it with NyQuil and it resolves.  Additionally, she has had the onset of pain and erythema of her right foot and ankle. With antibiotics and also possibly steroids the pain has resolved and the erythema has significantly improved.  She is scheduled for a D&C and a little over 2 weeks for menorrhagia. She is on fell on Megace to treat this.  PMHx, meds, allergies, soc hx, and family hx were reviewed  Filed Vitals:   11/05/11 2200 11/05/11 2300 11/06/11 0350 11/06/11 1149  BP: 109/58 147/68 133/64   Pulse:  104 107   Temp:   98.4 F (36.9 C) 98.3 F (36.8 C)  TempSrc:   Oral   Resp:  20 23   Height:      Weight:      SpO2:  99% 97%    GEN : NAD, eating breakfast. HRRR LCTAB ABD + BS Neuro : no focal R foot : sig decreased erythema, no tenderness to palpation, FROM  Labs and imaging reviewed.  Assessment and Plan: I agree with the formulated Assessment and Plan with the following changes:  1. Acute  febrile illness - the concern on admission was for meningitis. This is much less likely on reexamination. Bone marrow antibiotics to ceftriaxone. We'll not pursue therapy at this time. We'll consult ID concerning the possibility of meningitis and tick borne illnesses. Cultures have been obtained and are pending.  2. R foot and ankle cellulitis - she has responded quickly to antibiotics. The concern of gout was brought up as she had also gotten a single dose of IV steroids. She has no history of gout although that and does not rule out the possibility. We'll narrow antibiotics and possible. This pain reoccurs can do arthrocentesis to assess for crystals.  3. Acute kidney injury or - likely secondary to volume contraction as her creatinine has returned to normal with IV hydration.  4. Possible Chiari I malform'n - apparently has not progressed over the past few years per CT scans. However some of her headaches and neck pain could be secondary to this. Can pursue outpt open MRI and neurosurgery referral.  Nancy Spain, MD 8/30/20131:24 PM

## 2011-11-07 DIAGNOSIS — L03119 Cellulitis of unspecified part of limb: Principal | ICD-10-CM

## 2011-11-07 DIAGNOSIS — L02419 Cutaneous abscess of limb, unspecified: Principal | ICD-10-CM

## 2011-11-07 LAB — BASIC METABOLIC PANEL WITH GFR
BUN: 19 mg/dL (ref 6–23)
CO2: 24 meq/L (ref 19–32)
Calcium: 8.4 mg/dL (ref 8.4–10.5)
Chloride: 103 meq/L (ref 96–112)
Creatinine, Ser: 0.95 mg/dL (ref 0.50–1.10)
GFR calc Af Amer: 75 mL/min — ABNORMAL LOW
GFR calc non Af Amer: 65 mL/min — ABNORMAL LOW
Glucose, Bld: 153 mg/dL — ABNORMAL HIGH (ref 70–99)
Potassium: 3.7 meq/L (ref 3.5–5.1)
Sodium: 137 meq/L (ref 135–145)

## 2011-11-07 LAB — CBC
HCT: 28.7 % — ABNORMAL LOW (ref 36.0–46.0)
Hemoglobin: 9.2 g/dL — ABNORMAL LOW (ref 12.0–15.0)
MCH: 23.1 pg — ABNORMAL LOW (ref 26.0–34.0)
MCHC: 32.1 g/dL (ref 30.0–36.0)
MCV: 72.1 fL — ABNORMAL LOW (ref 78.0–100.0)
Platelets: 189 10*3/uL (ref 150–400)
RBC: 3.98 MIL/uL (ref 3.87–5.11)
RDW: 15.1 % (ref 11.5–15.5)
WBC: 26.4 10*3/uL — ABNORMAL HIGH (ref 4.0–10.5)

## 2011-11-07 MED ORDER — CEPHALEXIN 500 MG PO CAPS: 500.0000 mg | ORAL_CAPSULE | Freq: Four times a day (QID) | ORAL | Status: AC

## 2011-11-07 NOTE — Progress Notes (Signed)
[facility]    Subjective: No new complaints feels better   Antibiotics:  Anti-infectives     Start     Dose/Rate Route Frequency Ordered Stop   11/06/11 2200   vancomycin (VANCOCIN) 1,750 mg in sodium chloride 0.9 % 500 mL IVPB  Status:  Discontinued        1,750 mg 250 mL/hr over 120 Minutes Intravenous Every 24 hours 11/05/11 2200 11/06/11 1025   11/06/11 1200   vancomycin (VANCOCIN) 1,250 mg in sodium chloride 0.9 % 250 mL IVPB  Status:  Discontinued        1,250 mg 166.7 mL/hr over 90 Minutes Intravenous Every 12 hours 11/06/11 1025 11/06/11 1116   11/06/11 1200   ampicillin (OMNIPEN) 2 g in sodium chloride 0.9 % 50 mL IVPB  Status:  Discontinued        2 g 150 mL/hr over 20 Minutes Intravenous 6 times per day 11/06/11 1109 11/06/11 1116   11/06/11 1100   doxycycline (VIBRA-TABS) tablet 100 mg  Status:  Discontinued        100 mg Oral Every 12 hours 11/06/11 0956 11/06/11 1116   11/05/11 2200   cefTRIAXone (ROCEPHIN) 2 g in dextrose 5 % 50 mL IVPB        2 g 100 mL/hr over 30 Minutes Intravenous Every 12 hours 11/05/11 2017     11/05/11 2200   vancomycin (VANCOCIN) 2,000 mg in sodium chloride 0.9 % 500 mL IVPB        2,000 mg 250 mL/hr over 120 Minutes Intravenous  Once 11/05/11 2200 11/06/11 0119   11/05/11 2100   ampicillin (OMNIPEN) 1 g in sodium chloride 0.9 % 50 mL IVPB  Status:  Discontinued        1 g 150 mL/hr over 20 Minutes Intravenous 4 times per day 11/05/11 2028 11/06/11 1109   11/05/11 1700   vancomycin (VANCOCIN) IVPB 1000 mg/200 mL premix        1,000 mg 200 mL/hr over 60 Minutes Intravenous  Once 11/05/11 1647 11/05/11 1750   11/05/11 1630   vancomycin (VANCOCIN) injection 1 g  Status:  Discontinued        1 g Intravenous  Once 11/05/11 1626 11/05/11 1645   11/05/11 1630   piperacillin-tazobactam (ZOSYN) IVPB 3.375 g  Status:  Discontinued        3.375 g 12.5 mL/hr over 240 Minutes Intravenous  Once 11/05/11 1626  11/05/11 1945   11/05/11 1445   doxycycline (VIBRAMYCIN) 100 mg in dextrose 5 % 250 mL IVPB        100 mg 125 mL/hr over 120 Minutes Intravenous  Once 11/05/11 1444 11/05/11 2010          Medications: Scheduled Meds:   . aspirin  325 mg Oral Daily  . cefTRIAXone (ROCEPHIN)  IV  2 g Intravenous Q12H  . ferrous sulfate  325 mg Oral TID WC  . heparin  5,000 Units Subcutaneous Q8H  . levothyroxine  50 mcg Oral QAC breakfast  . megestrol  60 mg Oral Daily  . metoprolol  50 mg Oral BID   Continuous Infusions:   . DISCONTD: sodium chloride 1,000 mL (11/05/11 1645)  . DISCONTD: sodium chloride 150 mL/hr at 11/06/11 1400   PRN Meds:.acetaminophen, acetaminophen, albuterol, alum & mag hydroxide-simeth, ondansetron (ZOFRAN) IV, ondansetron, DISCONTD:  morphine injection   Objective: Weight change:   Intake/Output Summary (Last 24 hours) at 11/07/11 1134 Last data filed at 11/07/11  0550  Gross per 24 hour  Intake    300 ml  Output      3 ml  Net    297 ml   Blood pressure 131/88, pulse 75, temperature 98.4 F (36.9 C), temperature source Oral, resp. rate 18, height 5' 2.99" (1.6 m), weight 288 lb 12.8 oz (131 kg), SpO2 99.00%. Temp:  [98.3 F (36.8 C)-98.7 F (37.1 C)] 98.4 F (36.9 C) (08/31 0550) Pulse Rate:  [75-105] 75  (08/31 0550) Resp:  [17-18] 18  (08/31 0550) BP: (98-140)/(56-88) 131/88 mmHg (08/31 0550) SpO2:  [97 %-99 %] 99 % (08/31 0550)  Physical Exam: General: Alert and awake, oriented x3, not in any acute distress. CVS regular rate, normal r,  no murmur rubs or gallops Chest: clear to auscultation bilaterally, no wheezing, rales or rhonchi Abdomen: soft nontender, nondistended, normal bowel sounds, Extremities: no  clubbing or edema noted bilaterally Skin erythema continue to improve dramatically Lab Results:  [facility] 11/07/11 0615 11/06/11 0642  WBC 26.4* 26.1*  HGB 9.2* 10.0*  HCT 28.7* 30.7*  PLT 189 193    BMET  Basename 11/07/11 0615  11/06/11 0642  NA 137 136  K 3.7 4.0  CL 103 102  CO2 24 25  GLUCOSE 153* 220*  BUN 19 17  CREATININE 0.95 0.98  CALCIUM 8.4 8.6    Micro Results: Recent Results (from the past 240 hour(s))  CULTURE, BLOOD (SINGLE)     Status: Normal (Preliminary result)   Collection Time   11/05/11  2:00 PM      Component Value Range Status Comment   Specimen Description BLOOD ARM RIGHT   Final    Special Requests     Final    Value: BOTTLES DRAWN AEROBIC AND ANAEROBIC AERO 10CC,ANAE 5CC   Culture  Setup Time 11/05/2011 20:44   Final    Culture     Final    Value:        BLOOD CULTURE RECEIVED NO GROWTH TO DATE CULTURE WILL BE HELD FOR 5 DAYS BEFORE ISSUING A FINAL NEGATIVE REPORT   Report Status PENDING   Incomplete   CULTURE, BLOOD (ROUTINE X 2)     Status: Normal (Preliminary result)   Collection Time   11/06/11 11:17 AM      Component Value Range Status Comment   Specimen Description BLOOD RIGHT HAND   Final    Special Requests     Final    Value: BOTTLES DRAWN AEROBIC AND ANAEROBIC AERO 10CC ANA 7CC   Culture  Setup Time 11/06/2011 18:06   Final    Culture     Final    Value:        BLOOD CULTURE RECEIVED NO GROWTH TO DATE CULTURE WILL BE HELD FOR 5 DAYS BEFORE ISSUING A FINAL NEGATIVE REPORT   Report Status PENDING   Incomplete   CULTURE, BLOOD (ROUTINE X 2)     Status: Normal (Preliminary result)   Collection Time   11/06/11 12:27 PM      Component Value Range Status Comment   Specimen Description BLOOD ARM RIGHT   Final    Special Requests BOTTLES DRAWN AEROBIC ONLY 5CC   Final    Culture  Setup Time 11/06/2011 18:05   Final    Culture     Final    Value:        BLOOD CULTURE RECEIVED NO GROWTH TO DATE CULTURE WILL BE HELD FOR 5 DAYS BEFORE ISSUING A FINAL NEGATIVE REPORT  Report Status PENDING   Incomplete     Studies/Results: Dg Chest 2 View  11/05/2011  *RADIOLOGY REPORT*  Clinical Data: Chest pain.  Shortness of breath.  Fever. Influenza.  CHEST - 2 VIEW  Comparison:  Two-view chest x-ray 07/31/2010, 01/02/2008.  Findings: Suboptimal inspiration due to body habitus which accounts for crowded bronchovascular markings at the bases and accentuates the cardiac silhouette.  Taking this into account, cardiomediastinal silhouette unremarkable and unchanged.  Lungs clear.  Bronchovascular markings normal.  Pulmonary vascularity normal.  No pneumothorax.  No pleural effusions.  Stable chronic elevation of the right hemidiaphragm.  Degenerative changes and DISH involving the thoracic spine.  No significant interval change.  IMPRESSION: Suboptimal inspiration.  No acute cardiopulmonary disease.   Original Report Authenticated By: Arnell Sieving, M.D.    Ct Head Wo Contrast  11/05/2011  *RADIOLOGY REPORT*  Clinical Data: Fever, chills and headache.  CT HEAD WITHOUT CONTRAST  Technique:  Contiguous axial images were obtained from the base of the skull through the vertex without contrast.  Comparison: Head CT 06/08/2008.  Findings: There is a fullness and crowding of structures in the region of the foramen magnum, which is suspicious for low lying cerebellar tonsils, which could be indicative of a Chiari 1 malformation.  No associated hydrocephalus at this time.  Faint physiologic calcifications of the basal ganglia bilaterally.  No acute intracranial abnormality is otherwise identified. Specifically, no evidence of acute intracerebral hemorrhage, no definite findings to suggest acute/subacute cerebral ischemia, no focal mass or abnormal intra or extra-axial fluid collections. Visualized paranasal sinuses and mastoids are remarkable for chronic bilateral mastoid effusions (similar to prior).  No acute displaced skull fractures are identified.  IMPRESSION: 1.  Findings, as above, concerning for potential Chiari 1 malformation.  No associated hydrocephalus at this time. This may be an etiology for the patient's headache.  This could be confirmed with a brain MRI. 2.  No other potentially  acute findings in the brain. 3.  Chronic bilateral mastoid effusions, similar to prior study 06/08/2008.   Original Report Authenticated By: Florencia Reasons, M.D.       Assessment/Plan: Nancy [name] is a 56 y.o. female with  Fever, celllultis and neck pain, headaches  1) Cellulitis: I think she is doing very well and I would be fine with change to keflex 500mg  po qid to complete a total of 10 days of antibotic therapy with fu in OPC. If she has evidence of recurrence would do MRI of the ankle  2) Screening: she should be screened for HIV by EIA based on CDC guidelines   I will sign off for now pleas call with further questions.     LOS: 2 days   Acey Lav 11/07/2011, 11:34 AM

## 2011-11-07 NOTE — Progress Notes (Signed)
Medical Student Daily Progress Note  Subjective: No acute events over night. Nancy [name] reports that she continues to feel much better, no head and neck pain, no ankle pain. Denies shortness of breath, chest pain, leg swelling. Eating and drinking well. Objective: Vital signs in last 24 hours: Filed Vitals:   11/06/11 1149 11/06/11 1734 11/06/11 2202 11/07/11 0550  BP:  98/56 140/81 131/88  Pulse:  79 86 75  Temp: 98.3 F (36.8 C) 98.7 F (37.1 C) 98.7 F (37.1 C) 98.4 F (36.9 C)  TempSrc:  Oral    Resp:  17 18 18   Height:      Weight:      SpO2:   97% 99%   Weight change:   Intake/Output Summary (Last 24 hours) at 11/07/11 0835 Last data filed at 11/07/11 0550  Gross per 24 hour  Intake    350 ml  Output      3 ml  Net    347 ml   Physical Exam: General appearance: alert, cooperative and no distress Head: Normocephalic, without obvious abnormality, sinuses nontender to percussion Eyes: conjunctivae/corneas clear. PERRL Throat: lips, mucosa, and tongue normal; teeth and gums normal Neck: No tenderness to palpation, full ROM, no stiffness Lungs: clear to auscultation bilaterally Heart: regular rate and rhythm, S1, S2 normal, no murmur, click, rub or gallop Extremities: RLE erythema almost completely resolved. Slight warmth compared to left ankle. No pain to palpation or movement. Full ROM Lab Results: Basic Metabolic Panel:  Lab 11/07/11 9562 11/06/11 0642  Nancy [name] 137 136  K 3.7 4.0  CL 103 102  CO2 24 25  GLUCOSE 153* 220*  BUN 19 17  CREATININE 0.95 0.98  CALCIUM 8.4 8.6  MG -- --  PHOS -- --   Liver Function Tests:  Lab 11/06/11 0642 11/05/11 1500  AST 16 18  ALT 8 9  ALKPHOS 75 60  BILITOT 0.6 1.1  PROT 7.7 8.2  ALBUMIN 3.0* 3.4*   CBC:  Lab 11/07/11 0615 11/06/11 0642 11/05/11 1500  WBC 26.4* 26.1* --  NEUTROABS -- -- 22.1*  HGB 9.2* 10.0* --  HCT 28.7* 30.7* --  MCV 72.1* 72.1* --  PLT 189 193 --     Micro Results: Recent Results (from  the past 240 hour(s))  CULTURE, BLOOD (SINGLE)     Status: Normal (Preliminary result)   Collection Time   11/05/11  2:00 PM      Component Value Range Status Comment   Specimen Description BLOOD ARM RIGHT   Final    Special Requests     Final    Value: BOTTLES DRAWN AEROBIC AND ANAEROBIC AERO 10CC,ANAE 5CC   Culture  Setup Time 11/05/2011 20:44   Final    Culture     Final    Value:        BLOOD CULTURE RECEIVED NO GROWTH TO DATE CULTURE WILL BE HELD FOR 5 DAYS BEFORE ISSUING A FINAL NEGATIVE REPORT   Report Status PENDING   Incomplete     Medications: I have reviewed the patient's current medications. Scheduled Meds:   . aspirin  325 mg Oral Daily  . cefTRIAXone (ROCEPHIN)  IV  2 g Intravenous Q12H  . ferrous sulfate  325 mg Oral TID WC  . heparin  5,000 Units Subcutaneous Q8H  . levothyroxine  50 mcg Oral QAC breakfast  . megestrol  60 mg Oral Daily  . metoprolol  50 mg Oral BID  . DISCONTD: ampicillin (OMNIPEN) IV  1  g Intravenous Q6H  . DISCONTD: ampicillin (OMNIPEN) IV  2 g Intravenous Q4H  . DISCONTD: chlorthalidone  25 mg Oral Daily  . DISCONTD: dexamethasone  10 mg Intravenous Q6H  . DISCONTD: doxycycline  100 mg Oral Q12H  . DISCONTD: potassium chloride  40 mEq Oral Q6H  . DISCONTD: vancomycin  1,250 mg Intravenous Q12H  . DISCONTD: vancomycin  1,750 mg Intravenous Q24H   Continuous Infusions:   . DISCONTD: sodium chloride 1,000 mL (11/05/11 1645)  . DISCONTD: sodium chloride 150 mL/hr at 11/06/11 1400   PRN Meds:.acetaminophen, acetaminophen, albuterol, alum & mag hydroxide-simeth, morphine injection, ondansetron (ZOFRAN) IV, ondansetron Assessment/Plan: Fever, chills: The patient has been afebrile for about 36 hours. Denies any head and neck pain. No neurological deficits, is feeling well. Suspicion for meningitis continues to be very low.  ID was consulted yesterday and suggested narrowing to Ceftriaxone to cover for cellulitis. Has been off of Vanc, Ampicillin,  and  Dexamethasone for over 24 hours. CBC remains elevated at 26, which could still be due to treated cellulitis +/- reaction to dexamethasone. ID indicated that we cannot rule out septic joint, which could also be causing persistent leukocytosis. However there is no effusion to tap for fluid analysis. Chest x-ray and urinalysis from admission negative for other infectious etiologies. --Blood culture from admission negative to date. --2 blood cultures from 8/30 pending. -- Continue ceftriaxone, which covers cellulitis plus possible septic joint. --Continue to watch clinically. If patient is doing well tomorrow on Ceftriaxone only, will likely discharge. --If discharge tomorrow, will likely need Abx coverage for cellulitis plus septic joint.   Acute Kidney Injury w/ Hyponatremia, Hypokalemia: Creatinine from 1.46-->0.98 with fluids. FeNa on admission was suggestive of prerenal insult. This has resolved.   Chiari Malformation: Possible Chiari I malformation that could explain recurrent head and neck pain like the patient had on admission. Will need outpatient MRI to confirm due to patient's claustrophobia.  Hypertension: Patient normally on metoprolol and chlorthalidone. Chlorthalidone held due to AKI and electrolyte disturbances. These have resolved, however the patient's blood pressure is good off of the chlorthalidone at this time. --Continue Metoprolol  Diet: Regular   VTE: Heparin   Dispo: Plan to discharge tomorrow if she remains improved.   LOS: 2 days   This is a Psychologist, occupational Note.  The care of the patient was discussed with Dr. Dierdre Searles and the assessment and plan formulated with their assistance.  Please see their attached note for official documentation of the daily encounter.  Nancy [name] 11/07/2011, 8:35 AM  Resident Co-sign Daily Note: I have seen the patient and reviewed the daily progress note by MS 4 Nancy [name] and discussed the care of the patient with  them.  See below for documentation of my findings, assessment, and plans.  Subjective: Patient feels ok. Afebrile,  no C/O. No pain on right lower leg. Ambulate ok without problems. States that she feels well to go home today.  Objective: Vital signs in last 24 hours: Filed Vitals:   11/06/11 1734 11/06/11 2202 11/07/11 0550 11/07/11 1430  BP: 98/56 140/81 131/88 135/75  Pulse: 79 86 75 69  Temp: 98.7 F (37.1 C) 98.7 F (37.1 C) 98.4 F (36.9 C) 98.3 F (36.8 C)  TempSrc: Oral   Oral  Resp: 17 18 18 18   Height:      Weight:      SpO2:  97% 99% 98%   Physical Exam: General appearance: alert, cooperative and no distress  Head: Normocephalic, without obvious abnormality, sinuses nontender to percussion  Eyes: conjunctivae/corneas clear. PERRL  Throat: lips, mucosa, and tongue normal; teeth and gums normal  Neck: No tenderness to palpation, full ROM, no stiffness  Lungs: clear to auscultation bilaterally  Heart: regular rate and rhythm, S1, S2 normal, no murmur, click, rub or gallop  Extremities: RLE erythema almost completely resolved. No swelling,warmth to touch or pain to palpation or movement. Full ROM  Lab Results: Reviewed and documented in Electronic Record Micro Results: Reviewed and documented in Electronic Record Studies/Results: Reviewed and documented in Electronic Record Medications: I have reviewed the patient's current medications.  Assessment/Plan: Nancy [name] is a 56 y.o. female with Fever, celllultis and neck pain, headaches   1) Cellulitis: almost completely resolved, consulted ID Dr. Daiva Eves who recs "keflex 500mg  po qid to complete a total of 10 days of antibotic therapy with fu in OPC. If she has evidence of recurrence would do MRI of the ankle" Will Discharge her today  2) Leukocytosis  Patient is afebrile. Feels good. Negative Blood culture. Clinically much improved.  The etiology of her leukocytosis could be associated with demargination 2/2  IV steroids and ABX treatment. Will D/C her with close outpatient follow up once she is done with her ABX.    3) Screening: she will need HIV at follow ups.   4) Other chronic problems stable  LOS: 2 days   Nancy [name]  11/07/11 4:00 pm

## 2011-11-07 NOTE — Discharge Instructions (Signed)
 1. Follow up in 10 days. 2. The clinic will call you for a follow up appointment 3. Please take Keflex  500 mg po QID for 10 days

## 2011-11-09 NOTE — Discharge Summary (Signed)
Internal Medicine Teaching [facility] Discharge Note  Name: Nancy [name] MRN: [id number] DOB: 1955/12/10 56 y.o.  Date of Admission: 11/05/2011  1:22 PM Date of Discharge: 11/07/11 Attending Physician: Dr. Blanch Media  Discharge Diagnosis: 1. Right lower extremity cellulitis 2. Leukocytosis 3. Acute kidney injury 4. Hypertension 5. Iron deficiency Anemia 6. Chiari Malformation 7.  Hypothyroidism 8.  Morbid obesity    Discharge Medications: Medication List  As of 11/09/2011 12:56 PM   TAKE these medications         albuterol 108 (90 BASE) MCG/ACT inhaler   Commonly known as: PROVENTIL HFA;VENTOLIN HFA   Inhale 1 puff into the lungs every 6 (six) hours as needed for wheezing.      aspirin 325 MG tablet   Take 325 mg by mouth daily.      cephALEXin 500 MG capsule   Commonly known as: KEFLEX   Take 1 capsule (500 mg total) by mouth 4 (four) times daily.      chlorthalidone 25 MG tablet   Commonly known as: HYGROTON   Take 1 tablet (25 mg total) by mouth daily.      ferrous sulfate 325 (65 FE) MG tablet   Take 325 mg by mouth 3 (three) times daily with meals.      fluticasone 50 MCG/ACT nasal spray   Commonly known as: FLONASE   Place 1 each into both nostrils every morning.      HYDROcodone-acetaminophen 5-500 MG per tablet   Commonly known as: VICODIN   Take 1 tablet by mouth every 8 (eight) hours as needed. As needed for severe pain.      levothyroxine 50 MCG tablet   Commonly known as: SYNTHROID, LEVOTHROID   Take 1 tablet (50 mcg total) by mouth daily.      megestrol 20 MG tablet   Commonly known as: MEGACE   Take 60 mg by mouth daily.      metoprolol 50 MG tablet   Commonly known as: LOPRESSOR   Take 50 mg by mouth 2 (two) times daily.            Disposition and follow-up:   [name] was discharged from [facility] in Stable condition.  At the hospital follow up visit please address   # Please evaluate her right  ankle for complete resolution of cellulitis, consider MRI if recurrence.  # please arrange outpatient MRI to evaluate Chiari malformation. Patient will need to be medicated before the exam due to claustrophobia  # please repeat CBC to trend her leukocytosis.  # please repeat her BMP for AKI evaluation # consider outpatient work up for iron deficiency anemia ( ? Colonoscopy and GYN follow up)   Follow-up Appointments: Follow-up Information    Follow up with Lars Mage, MD. (will ask the clinic to set up appointment.)    Contact information:   [address] [place] [address] [phone]         Discharge Orders    Future Appointments: Provider: Department: Dept Phone: Center:   11/16/2011 10:15 AM [facility] [phone] None      Consultations:  ID Dr. Daiva Eves  Procedures Performed:  Dg Chest 2 View  11/05/2011  *RADIOLOGY REPORT*  Clinical Data: Chest pain.  Shortness of breath.  Fever. Influenza.  CHEST - 2 VIEW  Comparison: Two-view chest x-ray 07/31/2010, 01/02/2008.  Findings: Suboptimal inspiration due to body habitus which accounts for crowded bronchovascular  markings at the bases and accentuates the cardiac silhouette.  Taking this into account, cardiomediastinal silhouette unremarkable and unchanged.  Lungs clear.  Bronchovascular markings normal.  Pulmonary vascularity normal.  No pneumothorax.  No pleural effusions.  Stable chronic elevation of the right hemidiaphragm.  Degenerative changes and DISH involving the thoracic spine.  No significant interval change.  IMPRESSION: Suboptimal inspiration.  No acute cardiopulmonary disease.   Original Report Authenticated By: Arnell Sieving, M.D.    Ct Head Wo Contrast  11/05/2011  *RADIOLOGY REPORT*  Clinical Data: Fever, chills and headache.  CT HEAD WITHOUT CONTRAST  Technique:  Contiguous axial images were obtained from the base of the skull through the vertex without contrast.   Comparison: Head CT 06/08/2008.  Findings: There is a fullness and crowding of structures in the region of the foramen magnum, which is suspicious for low lying cerebellar tonsils, which could be indicative of a Chiari 1 malformation.  No associated hydrocephalus at this time.  Faint physiologic calcifications of the basal ganglia bilaterally.  No acute intracranial abnormality is otherwise identified. Specifically, no evidence of acute intracerebral hemorrhage, no definite findings to suggest acute/subacute cerebral ischemia, no focal mass or abnormal intra or extra-axial fluid collections. Visualized paranasal sinuses and mastoids are remarkable for chronic bilateral mastoid effusions (similar to prior).  No acute displaced skull fractures are identified.  IMPRESSION: 1.  Findings, as above, concerning for potential Chiari 1 malformation.  No associated hydrocephalus at this time. This may be an etiology for the patient's headache.  This could be confirmed with a brain MRI. 2.  No other potentially acute findings in the brain. 3.  Chronic bilateral mastoid effusions, similar to prior study 06/08/2008.   Original Report Authenticated By: Florencia Reasons, M.D.    Mm Digital Screening  10/14/2011  *RADIOLOGY REPORT*  Clinical Data: Screening.  DIGITAL BILATERAL SCREENING MAMMOGRAM WITH CAD  Comparison:  Previous exams.  Findings:  There are scattered fibroglandular densities. No suspicious masses, architectural distortion, or calcifications are present.  Images were processed with CAD.  IMPRESSION: No mammographic evidence of malignancy.  A result letter of this screening mammogram will be mailed directly to the patient.  RECOMMENDATION: Screening mammogram in one year. (Code:SM-B-01Y)  BI-RADS CATEGORY 1:  Negative.  Original Report Authenticated By: [email or web address]    Admission HPI:  The patient is a 56 year old woman with a history of hypothyroidism, hypertension, morbid obesity and Sjogren's syndrome who presents  to the ED with a 1 day history of fevers (Tmax 104 approximately 22 hours ago), headache, neck pain and right lower extremity pain and redness. She was feeling well prior to yesterday at which time she felt chills, myalgias and headache. She endorses pain and stiffness in the back and sides of her neck, as well as head pain at the top of her head. Her head and neck pain is exacerbated by movement and touch, and alleviated some by rest. The pain is "20/10" on palpation and movement of her neck. She also endorses a 1-2 day history of mild congestion, sore throat and facial pain, as well a cough productive of a yellowish sputum. She also endorses some mild nausea and bilateral aching chest pain. She denies any photophobia, confusion, rhinnorrhea, ear pain or changes in hearing, head trauma, recent illnesses or vomiting.  The patient also has a 1 day history of redness, pain and swelling of the right ankle. She had been outside and active with her grandchildren but denies any trauma, tick  bites. The pain is exacerbated with palpation, and it does not hurt with rest. She denies any other skin changes, discharge or pain anywhere else.  Of note she has had a 2 week history of menorrhagia for which she is scheduled for a D&C on 9/16. She denies use of tampons for her bleeding and only uses pads.  In the ED, she was given 1L NS, doxycycline for possible tick-borne illness, and vanc and zosyn for probable cellulitis.    Hospital Course by problem list:  1. Fever and Right lower extremity cellulitis The patient initially came in with fevers (104), chills, head/neck pain, Myalgias and right lower extremity cellulitis around the ankle. Her CT of the head showed possible Chiari 1 malformation and she was unable to have an LP to assess for meningitis. MRI was suggested prior to an LP but due to claustrophobia she was not able to get an MRI. Patient responded dramatically to empiric therapy with amp/rocpehin/vanco and  decadron in 12 hours, with complete resolution of head and neck pain and fevers.   ID was consulted and recommended narrowing antibiotic spectrum to Ceftriaxone cover only cellulitis +/- septic joint. Joint space was never tapped, as she never developed an effusion. The patient's pain, erythema and swelling around ankle improved completely by hospital day 3 and she could ambulate without any problems.  The patient was discharged on PO Keflex 500 mg po QID to complete a total of 10 days of antibotic therapy to cover cellulitis.  If she has evidence of recurrence would do MRI of the ankle.  2. Leukocytosis  Lab 11/07/11 0615 11/06/11 0642 11/05/11 1500  HGB 9.2* 10.0* 10.6*  HCT 28.7* 30.7* 32.8*  WBC 26.4* 26.1* 25.1*  PLT 189 193 212        Patient is afebrile. Feels good. Negative Blood culture. Clinically much improved.  The etiology of her leukocytosis could be associated with acute infection and demargination 2/2 IV steroids and ABX treatment.  Will D/C her with close outpatient follow up in one week. I sent the clinic a note for the front desk to call patient to set up an appt.   3. Acute kidney injury     The patient's creatinine on admission was 1.46 (baseline 0.8). FeNa was 0.11, suggesting prerenal insult, likely due to limited PO intake. The patient was given fluids and the creatinine decreased to 0.95 and remained stable. Her urine output remained adequate and she had no urinary complaints.  4. Hypertension, stable, continue home regimen  5. Iron deficiency Anemia Patient's H/H indicative of anemia;  Iron panel showed low iron, low TIBC and normal ferritin, suggestive of iron deficiency, likely associated with  menorrhagia 2/2 endometrial thickening. She will need follow up as an outpatient for further evaluation/management and possible colonoscopy. She will also need GYN follow up. She is discharged with iron supplement.   6. Chiari Malformation   CT scan on admission showed  possible Chiari I malformation; however a confirmatory MRI was not obtained due to the patient's claustrophobia. Because this type of malformation can cause paroxysmal head and neck pain like the patient had on admission, will recommend outpatient open MRI to verify existence of Chiari malformation.   7.  Hypothyroidism, last TSH 3.317. Stable, continue home regimen.  8.  Morbid obesity Weight loss discussed with patient. She will continue to work with her PCP as an outpatient.      Discharge Vitals:  BP 135/75  Pulse 69  Temp 98.3 F (36.8  C) (Oral)  Resp 18  Ht 5' 2.99" (1.6 m)  Wt 288 lb 12.8 oz (131 kg)  BMI 51.17 kg/m2  SpO2 98%  Discharge Labs: No results found for this or any previous visit (from the past 24 hour(s)).  Signed: Piper Hassebrock 11/09/2011, 12:56 PM   Time Spent on Discharge: 45 minutes

## 2011-11-11 LAB — CULTURE, BLOOD (SINGLE): Culture: NO GROWTH

## 2011-11-12 LAB — CULTURE, BLOOD (ROUTINE X 2)
Culture: NO GROWTH
Culture: NO GROWTH

## 2011-11-16 ENCOUNTER — Encounter (HOSPITAL_COMMUNITY)
Admission: RE | Admit: 2011-11-16 | Discharge: 2011-11-16 | Disposition: A | Discharge status: Home / Self Care | Payer: Medicare Other | Source: Ambulatory Visit | Attending: Obstetrics & Gynecology | Admitting: Obstetrics & Gynecology

## 2011-11-16 ENCOUNTER — Encounter (HOSPITAL_COMMUNITY): Payer: Self-pay

## 2011-11-16 LAB — CBC
HCT: 34.2 % — ABNORMAL LOW (ref 36.0–46.0)
Hemoglobin: 10.7 g/dL — ABNORMAL LOW (ref 12.0–15.0)
MCH: 23.4 pg — ABNORMAL LOW (ref 26.0–34.0)
MCHC: 31.3 g/dL (ref 30.0–36.0)
MCV: 74.7 fL — ABNORMAL LOW (ref 78.0–100.0)
Platelets: 325 10*3/uL (ref 150–400)
RBC: 4.58 MIL/uL (ref 3.87–5.11)
RDW: 15.3 % (ref 11.5–15.5)
WBC: 5.9 10*3/uL (ref 4.0–10.5)

## 2011-11-16 LAB — BASIC METABOLIC PANEL WITH GFR
BUN: 11 mg/dL (ref 6–23)
CO2: 31 meq/L (ref 19–32)
Calcium: 8.6 mg/dL (ref 8.4–10.5)
Chloride: 96 meq/L (ref 96–112)
Creatinine, Ser: 0.9 mg/dL (ref 0.50–1.10)
GFR calc Af Amer: 80 mL/min — ABNORMAL LOW
GFR calc non Af Amer: 69 mL/min — ABNORMAL LOW
Glucose, Bld: 112 mg/dL — ABNORMAL HIGH (ref 70–99)
Potassium: 3.2 meq/L — ABNORMAL LOW (ref 3.5–5.1)
Sodium: 136 meq/L (ref 135–145)

## 2011-11-16 NOTE — Pre-Procedure Instructions (Signed)
Medical history and past EKG of Aug. 2013 reviewed by Dr Malen Gauze. No orders given.

## 2011-11-16 NOTE — Patient Instructions (Addendum)
20 Nancy [name]  11/16/2011   Your procedure is scheduled on:  11/23/11  Enter through the Main Entrance of [facility] at 1130 AM.  Pick up the phone at the desk and dial [phone].   Call this number if you have problems the morning of surgery: [phone]   Remember:   Do not eat food:after midnight  Do not drink clear liquids: 4 Hours before arrival.  Take these medicines the morning of surgery with A SIP OF WATER: take blood pressure medications, bring inhaler, may take thyroid medication and Keflex (antibiotic).   Do not wear jewelry, make-up or nail polish.  Do not wear lotions, powders, or perfumes. You may wear deodorant.  Do not shave 48 hours prior to surgery.  Do not bring valuables to the hospital.  Contacts, dentures or bridgework may not be worn into surgery.  Leave suitcase in the car. After surgery it may be brought to your room.  For patients admitted to the hospital, checkout time is 11:00 AM the day of discharge.   Patients discharged the day of surgery will not be allowed to drive home.  Name and phone number of your driver: undecided  Special Instructions: CHG Shower Use Special Wash: 1/2 bottle night before surgery and 1/2 bottle morning of surgery.   Please read over the following fact sheets that you were given: Surgical Site Infection Prevention

## 2011-11-19 ENCOUNTER — Encounter: Payer: Self-pay | Admitting: Internal Medicine

## 2011-11-19 ENCOUNTER — Ambulatory Visit (INDEPENDENT_AMBULATORY_CARE_PROVIDER_SITE_OTHER): Payer: Medicare Other | Admitting: Internal Medicine

## 2011-11-19 VITALS — BP 120/79 | HR 68 | Temp 97.3°F | Wt 284.1 lb

## 2011-11-19 DIAGNOSIS — L03119 Cellulitis of unspecified part of limb: Secondary | ICD-10-CM

## 2011-11-19 DIAGNOSIS — R9089 Other abnormal findings on diagnostic imaging of central nervous system: Secondary | ICD-10-CM | POA: Insufficient documentation

## 2011-11-19 DIAGNOSIS — N179 Acute kidney failure, unspecified: Secondary | ICD-10-CM

## 2011-11-19 DIAGNOSIS — R930 Abnormal findings on diagnostic imaging of skull and head, not elsewhere classified: Secondary | ICD-10-CM

## 2011-11-19 DIAGNOSIS — L02419 Cutaneous abscess of limb, unspecified: Secondary | ICD-10-CM

## 2011-11-19 DIAGNOSIS — D72829 Elevated white blood cell count, unspecified: Secondary | ICD-10-CM

## 2011-11-19 LAB — CBC WITH DIFFERENTIAL/PLATELET
Basophils Absolute: 0 10*3/uL (ref 0.0–0.1)
Basophils Relative: 1 % (ref 0–1)
Eosinophils Absolute: 0.1 10*3/uL (ref 0.0–0.7)
Eosinophils Relative: 3 % (ref 0–5)
HCT: 33.9 % — ABNORMAL LOW (ref 36.0–46.0)
Hemoglobin: 10.8 g/dL — ABNORMAL LOW (ref 12.0–15.0)
Lymphocytes Relative: 48 % — ABNORMAL HIGH (ref 12–46)
Lymphs Abs: 2.3 10*3/uL (ref 0.7–4.0)
MCH: 23.2 pg — ABNORMAL LOW (ref 26.0–34.0)
MCHC: 31.9 g/dL (ref 30.0–36.0)
MCV: 72.9 fL — ABNORMAL LOW (ref 78.0–100.0)
Monocytes Absolute: 0.5 10*3/uL (ref 0.1–1.0)
Monocytes Relative: 11 % (ref 3–12)
Neutro Abs: 1.8 10*3/uL (ref 1.7–7.7)
Neutrophils Relative %: 37 % — ABNORMAL LOW (ref 43–77)
Platelets: 335 10*3/uL (ref 150–400)
RBC: 4.65 MIL/uL (ref 3.87–5.11)
RDW: 16.5 % — ABNORMAL HIGH (ref 11.5–15.5)
WBC: 4.7 10*3/uL (ref 4.0–10.5)

## 2011-11-19 LAB — BASIC METABOLIC PANEL WITHOUT GFR
BUN: 11 mg/dL (ref 6–23)
CO2: 30 meq/L (ref 19–32)
Calcium: 9 mg/dL (ref 8.4–10.5)
Chloride: 99 meq/L (ref 96–112)
Creat: 1 mg/dL (ref 0.50–1.10)
GFR, Est African American: 72 mL/min
GFR, Est Non African American: 62 mL/min
Glucose, Bld: 90 mg/dL (ref 70–99)
Potassium: 3.8 meq/L (ref 3.5–5.3)
Sodium: 138 meq/L (ref 135–145)

## 2011-11-19 MED ORDER — ASPIRIN EC 81 MG PO TBEC: 81.0000 mg | DELAYED_RELEASE_TABLET | Freq: Every day | ORAL | Status: DC

## 2011-11-19 NOTE — Assessment & Plan Note (Signed)
Patient was having headache and was found to have abnormal CT head during hospital course. MRI was not performed as in-patient because patient is severely claustrophobic.  I reviewed her CT results with patient and her daughter today and recommended outpatient MRI with medications to help her relax; however, patient declined at this time because she is about to have GYN surgery on 11/23/11.  She states she will discuss with her PCP next office visit. Currently, patient is asymptomatic, denies any headache or any other neurological symptoms.

## 2011-11-19 NOTE — Progress Notes (Signed)
HPI: Ms. Texidor is a 56 yo woman with PMH of hypothyroidism, HTN, menorrhagia, iron deficiency presents today for hospital follow up.  It was admitted on August 29-31st for headache and right ankle cellulitis.  She had an abnormal CT head which was concerning for chiari I malformation however MRI was not able to be performed due to severe claustrophobia.  She was first treated with amp.rocephin/vanc/decadron and then was narrowed to Keflex and was sent home with 10 day course of Keflex.  She is doing well, denies any fever, chills, or headache.  Her ankle is much better, completely resolved, she finished 8 days of Keflex and has 2 more days left. She has menorrhagia (2 weeks per month) and is scheduled for D&C on 9/16. She was put on Megace for her surgery.  She does not want MRI of brain at this time because she has GYN surgery and will discuss with her PCP later. Of note, patient has been taking ASA 325 instead of 81mg   ROS: as per HPI  PE: General: alert, well-developed, and cooperative to examination. .  Lungs: normal respiratory effort, no accessory muscle use, normal breath sounds, no crackles, and no wheezes. Heart: normal rate, regular rhythm, no murmur, no gallop, and no rub.  Abdomen: obese, soft, non-tender, normal bowel sounds, no distention, no guarding, no rebound tenderness Msk: no joint swelling, no joint warmth, and no redness over joints.  Pulses: 2+ DP/PT pulses bilaterally Extremities: No cyanosis, clubbing, +2 non pitting edema on LE Neurologic: nonfocal Skin: turgor normal and right ankle: no erythema or tenderness or drainage

## 2011-11-19 NOTE — Assessment & Plan Note (Signed)
Right ankle cellulitis now resolved. She was treated with amp/rocephin/vanc/decadron and was then sent home with 10 day course of Keflex.  She completed 8 days of Keflex and still has 2 more days left. -Instructed patient to finish the Keflex course -Will repeat CBC, BMP since she had leukocytosis & AKI during hospital admission which were attributed to infection, steroids, and dehydration.

## 2011-11-19 NOTE — Patient Instructions (Addendum)
Will get labs today and I will call you with any abnormal lab results Stop taking aspirin 325mg  and start taking 81mg  one tablet daily Finish taking Keflex as prescribed Follow up with Dr. Eben Burow in 1 month

## 2011-11-23 ENCOUNTER — Encounter (HOSPITAL_COMMUNITY)
Admission: RE | Disposition: A | Discharge status: Home / Self Care | Payer: Self-pay | Source: Ambulatory Visit | Attending: Obstetrics & Gynecology

## 2011-11-23 ENCOUNTER — Ambulatory Visit (HOSPITAL_COMMUNITY): Payer: Medicare Other | Admitting: Anesthesiology

## 2011-11-23 ENCOUNTER — Ambulatory Visit (HOSPITAL_COMMUNITY)
Admission: RE | Admit: 2011-11-23 | Discharge: 2011-11-23 | Disposition: A | Discharge status: Home / Self Care | Payer: Medicare Other | Source: Ambulatory Visit | Attending: Obstetrics & Gynecology | Admitting: Obstetrics & Gynecology

## 2011-11-23 ENCOUNTER — Encounter (HOSPITAL_COMMUNITY): Payer: Self-pay | Admitting: Anesthesiology

## 2011-11-23 DIAGNOSIS — Z01818 Encounter for other preprocedural examination: Secondary | ICD-10-CM | POA: Insufficient documentation

## 2011-11-23 DIAGNOSIS — N950 Postmenopausal bleeding: Secondary | ICD-10-CM | POA: Diagnosis present

## 2011-11-23 DIAGNOSIS — Z01812 Encounter for preprocedural laboratory examination: Secondary | ICD-10-CM | POA: Insufficient documentation

## 2011-11-23 DIAGNOSIS — R9389 Abnormal findings on diagnostic imaging of other specified body structures: Secondary | ICD-10-CM | POA: Diagnosis present

## 2011-11-23 HISTORY — PX: EXAMINATION UNDER ANESTHESIA: SHX1540

## 2011-11-23 HISTORY — PX: HYSTEROSCOPY WITH D & C: SHX1775

## 2011-11-23 SURGERY — DILATATION AND CURETTAGE /HYSTEROSCOPY
Anesthesia: General | Site: Vagina | Wound class: Clean Contaminated

## 2011-11-23 MED ORDER — BUPIVACAINE HCL 0.5 % IJ SOLN
INTRAMUSCULAR | Status: DC | PRN
  Administered 2011-11-23: 30 mL

## 2011-11-23 MED ORDER — ONDANSETRON HCL 4 MG/2ML IJ SOLN
INTRAMUSCULAR | Status: DC | PRN
  Administered 2011-11-23: 4 mg via INTRAVENOUS

## 2011-11-23 MED ORDER — ONDANSETRON HCL 4 MG/2ML IJ SOLN
INTRAMUSCULAR | Status: AC
  Filled 2011-11-23: qty 2

## 2011-11-23 MED ORDER — PROPOFOL 10 MG/ML IV EMUL
INTRAVENOUS | Status: AC
  Filled 2011-11-23: qty 20

## 2011-11-23 MED ORDER — DOCUSATE SODIUM 100 MG PO CAPS: 100.0000 mg | ORAL_CAPSULE | Freq: Two times a day (BID) | ORAL | Status: DC | PRN

## 2011-11-23 MED ORDER — MIDAZOLAM HCL 2 MG/2ML IJ SOLN
INTRAMUSCULAR | Status: AC
  Filled 2011-11-23: qty 2

## 2011-11-23 MED ORDER — BUPIVACAINE HCL (PF) 0.5 % IJ SOLN
INTRAMUSCULAR | Status: AC
  Filled 2011-11-23: qty 30

## 2011-11-23 MED ORDER — PHENYLEPHRINE 40 MCG/ML (10ML) SYRINGE FOR IV PUSH (FOR BLOOD PRESSURE SUPPORT)
PREFILLED_SYRINGE | INTRAVENOUS | Status: AC
  Filled 2011-11-23: qty 5

## 2011-11-23 MED ORDER — DEXAMETHASONE SODIUM PHOSPHATE 10 MG/ML IJ SOLN
INTRAMUSCULAR | Status: AC
  Filled 2011-11-23: qty 1

## 2011-11-23 MED ORDER — LIDOCAINE HCL (CARDIAC) 20 MG/ML IV SOLN
INTRAVENOUS | Status: DC | PRN
  Administered 2011-11-23: 50 mg via INTRAVENOUS

## 2011-11-23 MED ORDER — PROPOFOL 10 MG/ML IV EMUL
INTRAVENOUS | Status: DC | PRN
  Administered 2011-11-23: 50 mg via INTRAVENOUS
  Administered 2011-11-23: 275 mg via INTRAVENOUS

## 2011-11-23 MED ORDER — MIDAZOLAM HCL 5 MG/5ML IJ SOLN
INTRAMUSCULAR | Status: DC | PRN
  Administered 2011-11-23: 2 mg via INTRAVENOUS

## 2011-11-23 MED ORDER — LACTATED RINGERS IV SOLN
INTRAVENOUS | Status: DC
  Administered 2011-11-23: 11:00:00 via INTRAVENOUS

## 2011-11-23 MED ORDER — GLYCINE 1.5 % IR SOLN
Status: DC | PRN
  Administered 2011-11-23: 1 "application "

## 2011-11-23 MED ORDER — OXYCODONE-ACETAMINOPHEN 5-325 MG PO TABS: 1.0000 | ORAL_TABLET | Freq: Four times a day (QID) | ORAL | Status: DC | PRN

## 2011-11-23 MED ORDER — LIDOCAINE HCL (CARDIAC) 20 MG/ML IV SOLN
INTRAVENOUS | Status: AC
  Filled 2011-11-23: qty 5

## 2011-11-23 MED ORDER — FENTANYL CITRATE 0.05 MG/ML IJ SOLN
INTRAMUSCULAR | Status: AC
  Filled 2011-11-23: qty 2

## 2011-11-23 MED ORDER — FENTANYL CITRATE 0.05 MG/ML IJ SOLN
INTRAMUSCULAR | Status: DC | PRN
  Administered 2011-11-23: 100 ug via INTRAVENOUS

## 2011-11-23 SURGICAL SUPPLY — 13 items
CANISTER SUCTION 2500CC (MISCELLANEOUS) ×3 IMPLANT
CATH ROBINSON RED A/P 16FR (CATHETERS) ×3 IMPLANT
CLOTH BEACON ORANGE TIMEOUT ST (SAFETY) ×3 IMPLANT
CONTAINER PREFILL 10% NBF 60ML (FORM) ×3 IMPLANT
DRAPE PROXIMA HALF (DRAPES) ×3 IMPLANT
DRESSING TELFA 8X3 (GAUZE/BANDAGES/DRESSINGS) ×3 IMPLANT
GLOVE BIO SURGEON STRL SZ7 (GLOVE) ×6 IMPLANT
GOWN PREVENTION PLUS XLARGE (GOWN DISPOSABLE) ×3 IMPLANT
GOWN STRL REIN XL XLG (GOWN DISPOSABLE) ×3 IMPLANT
PACK HYSTEROSCOPY LF (CUSTOM PROCEDURE TRAY) ×3 IMPLANT
PAD OB MATERNITY 4.3X12.25 (PERSONAL CARE ITEMS) ×3 IMPLANT
TOWEL OR 17X24 6PK STRL BLUE (TOWEL DISPOSABLE) ×6 IMPLANT
WATER STERILE IRR 1000ML POUR (IV SOLUTION) ×3 IMPLANT

## 2011-11-23 NOTE — Anesthesia Postprocedure Evaluation (Signed)
  Anesthesia Post-op Note  Patient: Nancy [name]  Procedure(s) Performed: Procedure(s) (LRB) with comments: DILATATION AND CURETTAGE /HYSTEROSCOPY (N/A) EXAM UNDER ANESTHESIA (N/A)  Patient Location: PACU  Anesthesia Type: General  Level of Consciousness: awake, alert  and oriented  Airway and Oxygen Therapy: Patient Spontanous Breathing  Post-op Pain: mild  Post-op Assessment: Post-op Vital signs reviewed, Patient's Cardiovascular Status Stable, Respiratory Function Stable, Patent Airway, No signs of Nausea or vomiting and Pain level controlled  Post-op Vital Signs: Reviewed and stable  Complications: No apparent anesthesia complications

## 2011-11-23 NOTE — Discharge Instructions (Signed)
 Hysteroscopy Hysteroscopy is a procedure used for looking inside the womb (uterus). It may be done for many different reasons, including:  To evaluate abnormal bleeding, fibroid (benign, noncancerous) tumors, polyps, scar tissue (adhesions), and possibly cancer of the uterus.   To look for lumps (tumors) and other uterine growths.   To look for causes of why a woman cannot get pregnant (infertility), causes of recurrent loss of pregnancy (miscarriages), or a lost intrauterine device (IUD).   To perform a sterilization by blocking the fallopian tubes from inside the uterus.  A hysteroscopy should be done right after a menstrual period to be sure you are not pregnant. LET YOUR CAREGIVER KNOW ABOUT:   Allergies.   Medicines taken, including herbs, eyedrops, over-the-counter medicines, and creams.   Use of steroids (by mouth or creams).   Previous problems with anesthetics or numbing medicines.   History of bleeding or blood problems.   History of blood clots.   Possibility of pregnancy, if this applies.   Previous surgery.   Other health problems.  RISKS AND COMPLICATIONS   Putting a hole in the uterus.   Excessive bleeding.   Infection.   Damage to the cervix.   Injury to other organs.   Allergic reaction to medicines.   Too much fluid used in the uterus for the procedure.  BEFORE THE PROCEDURE   Do not take aspirin  or blood thinners for a week before the procedure, or as directed. It can cause bleeding.   Arrive at least 60 minutes before the procedure or as directed to read and sign the necessary forms.   Arrange for someone to take you home after the procedure.   If you smoke, do not smoke for 2 weeks before the procedure.  PROCEDURE   Your caregiver may give you medicine to relax you. He or she may also give you a medicine that numbs the area around the cervix (local anesthetic) or a medicine that makes you sleep (general anesthesia).   Sometimes, a  medicine is placed in the cervix the day before the procedure. This medicine makes the cervix have a larger opening (dilate). This makes it easier for the instrument to be inserted into the uterus.   A small instrument (hysteroscope) is inserted through the vagina into the uterus. This instrument is similar to a pencil-sized telescope with a light.   During the procedure, air or a liquid is put into the uterus, which allows the surgeon to see better.   Sometimes, tissue is gently scraped from inside the uterus. These tissue samples are sent to a specialist who looks at tissue samples (pathologist). The pathologist will give a report to your caregiver. This will help your caregiver decide if further treatment is necessary. The report will also help your caregiver decide on the best treatment if the test comes back abnormal.  AFTER THE PROCEDURE   If you had a general anesthetic, you may be groggy for a couple hours after the procedure.   If you had a local anesthetic, you will be advised to rest at the surgical center or caregiver's office until you are stable and feel ready to go home.   You may have some cramping for a couple days.   You may have bleeding, which varies from light spotting for a few days to menstrual-like bleeding for up to 3 to 7 days. This is normal.   Have someone take you home.  FINDING OUT THE RESULTS OF YOUR TEST Not all test results  are available during your visit. If your test results are not back during the visit, make an appointment with your caregiver to find out the results. Do not assume everything is normal if you have not heard from your caregiver or the medical facility. It is important for you to follow up on all of your test results. HOME CARE INSTRUCTIONS   Do not drive for 24 hours or as instructed.   Only take over-the-counter or prescription medicines for pain, discomfort, or fever as directed by your caregiver.   Do not take aspirin . It can cause or  aggravate bleeding.   Do not drive or drink alcohol while taking pain medicine.   You may resume your usual diet.   Do not use tampons, douche, or have sexual intercourse for 2 weeks, or as advised by your caregiver.   Rest and sleep for the first 24 to 48 hours.   Take your temperature twice a day for 4 to 5 days. Write it down. Give these temperatures to your caregiver if they are abnormal (above 98.6 F or 37.0 C).   Take medicines your caregiver has ordered as directed.   Follow your caregiver's advice regarding diet, exercise, lifting, driving, and general activities.   Take showers instead of baths for 2 weeks, or as recommended by your caregiver.   If you develop constipation:   Take a mild laxative with the advice of your caregiver.   Eat bran foods.   Drink enough water and fluids to keep your urine clear or pale yellow.   Try to have someone with you or available to you for the first 24 to 48 hours, especially if you had a general anesthetic.   Make sure you and your family understand everything about your operation and recovery.   Follow your caregiver's advice regarding follow-up appointments and Pap smears.  SEEK MEDICAL CARE IF:   You feel dizzy or lightheaded.   You feel sick to your stomach (nauseous).   You develop abnormal vaginal discharge.   You develop a rash.   You have an abnormal reaction or allergy to your medicine.   You need stronger pain medicine.  SEEK IMMEDIATE MEDICAL CARE IF:   Bleeding is heavier than a normal menstrual period or you have blood clots.   You have an oral temperature above 102 F (38.9 C), not controlled by medicine.   You have increasing cramps or pains not relieved with medicine.   You develop belly (abdominal) pain that does not seem to be related to the same area of earlier cramping and pain.   You pass out.   You develop pain in the tops of your shoulders (shoulder strap areas).   You develop shortness of  breath.  MAKE SURE YOU:   Understand these instructions.   Will watch your condition.   Will get help right away if you are not doing well or get worse.  Document Released: 06/01/2000 Document Revised: 02/12/2011 Document Reviewed: 09/24/2008 [facility] Patient Information 2012 [place], [place].Hysteroscopy Hysteroscopy is a procedure used for looking inside the womb (uterus). It may be done for many different reasons, including:  To evaluate abnormal bleeding, fibroid (benign, noncancerous) tumors, polyps, scar tissue (adhesions), and possibly cancer of the uterus.   To look for lumps (tumors) and other uterine growths.   To look for causes of why a woman cannot get pregnant (infertility), causes of recurrent loss of pregnancy (miscarriages), or a lost intrauterine device (IUD).   To perform a sterilization  by blocking the fallopian tubes from inside the uterus.  A hysteroscopy should be done right after a menstrual period to be sure you are not pregnant. LET YOUR CAREGIVER KNOW ABOUT:   Allergies.   Medicines taken, including herbs, eyedrops, over-the-counter medicines, and creams.   Use of steroids (by mouth or creams).   Previous problems with anesthetics or numbing medicines.   History of bleeding or blood problems.   History of blood clots.   Possibility of pregnancy, if this applies.   Previous surgery.   Other health problems.  RISKS AND COMPLICATIONS   Putting a hole in the uterus.   Excessive bleeding.   Infection.   Damage to the cervix.   Injury to other organs.   Allergic reaction to medicines.   Too much fluid used in the uterus for the procedure.  BEFORE THE PROCEDURE   Do not take aspirin  or blood thinners for a week before the procedure, or as directed. It can cause bleeding.   Arrive at least 60 minutes before the procedure or as directed to read and sign the necessary forms.   Arrange for someone to take you home after the procedure.   If  you smoke, do not smoke for 2 weeks before the procedure.  PROCEDURE   Your caregiver may give you medicine to relax you. He or she may also give you a medicine that numbs the area around the cervix (local anesthetic) or a medicine that makes you sleep (general anesthesia).   Sometimes, a medicine is placed in the cervix the day before the procedure. This medicine makes the cervix have a larger opening (dilate). This makes it easier for the instrument to be inserted into the uterus.   A small instrument (hysteroscope) is inserted through the vagina into the uterus. This instrument is similar to a pencil-sized telescope with a light.   During the procedure, air or a liquid is put into the uterus, which allows the surgeon to see better.   Sometimes, tissue is gently scraped from inside the uterus. These tissue samples are sent to a specialist who looks at tissue samples (pathologist). The pathologist will give a report to your caregiver. This will help your caregiver decide if further treatment is necessary. The report will also help your caregiver decide on the best treatment if the test comes back abnormal.  AFTER THE PROCEDURE   If you had a general anesthetic, you may be groggy for a couple hours after the procedure.   If you had a local anesthetic, you will be advised to rest at the surgical center or caregiver's office until you are stable and feel ready to go home.   You may have some cramping for a couple days.   You may have bleeding, which varies from light spotting for a few days to menstrual-like bleeding for up to 3 to 7 days. This is normal.   Have someone take you home.  FINDING OUT THE RESULTS OF YOUR TEST Not all test results are available during your visit. If your test results are not back during the visit, make an appointment with your caregiver to find out the results. Do not assume everything is normal if you have not heard from your caregiver or the medical facility. It  is important for you to follow up on all of your test results. HOME CARE INSTRUCTIONS   Do not drive for 24 hours or as instructed.   Only take over-the-counter or prescription medicines for pain, discomfort,  or fever as directed by your caregiver.   Do not take aspirin . It can cause or aggravate bleeding.   Do not drive or drink alcohol while taking pain medicine.   You may resume your usual diet.   Do not use tampons, douche, or have sexual intercourse for 2 weeks, or as advised by your caregiver.   Rest and sleep for the first 24 to 48 hours.   Take your temperature twice a day for 4 to 5 days. Write it down. Give these temperatures to your caregiver if they are abnormal (above 98.6 F or 37.0 C).   Take medicines your caregiver has ordered as directed.   Follow your caregiver's advice regarding diet, exercise, lifting, driving, and general activities.   Take showers instead of baths for 2 weeks, or as recommended by your caregiver.   If you develop constipation:   Take a mild laxative with the advice of your caregiver.   Eat bran foods.   Drink enough water and fluids to keep your urine clear or pale yellow.   Try to have someone with you or available to you for the first 24 to 48 hours, especially if you had a general anesthetic.   Make sure you and your family understand everything about your operation and recovery.   Follow your caregiver's advice regarding follow-up appointments and Pap smears.  SEEK MEDICAL CARE IF:   You feel dizzy or lightheaded.   You feel sick to your stomach (nauseous).   You develop abnormal vaginal discharge.   You develop a rash.   You have an abnormal reaction or allergy to your medicine.   You need stronger pain medicine.  SEEK IMMEDIATE MEDICAL CARE IF:   Bleeding is heavier than a normal menstrual period or you have blood clots.   You have an oral temperature above 102 F (38.9 C), not controlled by medicine.   You have  increasing cramps or pains not relieved with medicine.   You develop belly (abdominal) pain that does not seem to be related to the same area of earlier cramping and pain.   You pass out.   You develop pain in the tops of your shoulders (shoulder strap areas).   You develop shortness of breath.  MAKE SURE YOU:   Understand these instructions.   Will watch your condition.   Will get help right away if you are not doing well or get worse.  Document Released: 06/01/2000 Document Revised: 02/12/2011 Document Reviewed: 09/24/2008 [facility] Patient Information 2012 [place], [place].

## 2011-11-23 NOTE — Anesthesia Procedure Notes (Signed)
Procedure Name: LMA Insertion Date/Time: 11/23/2011 12:16 PM Performed by: Isabella Bowens R Pre-anesthesia Checklist: Patient identified, Emergency Drugs available, Suction available, Patient being monitored and Timeout performed Patient Re-evaluated:Patient Re-evaluated prior to inductionOxygen Delivery Method: Circle system utilized Preoxygenation: Pre-oxygenation with 100% oxygen Intubation Type: IV induction LMA: LMA inserted LMA Size: 4.0 Grade View: Grade III Number of attempts: 1 Placement Confirmation: positive ETCO2 and breath sounds checked- equal and bilateral Dental Injury: Teeth and Oropharynx as per pre-operative assessment  Difficulty Due To: Difficulty was unanticipated

## 2011-11-23 NOTE — H&P (Signed)
Preoperative History and Physical  Nancy [name] is a 56 y.o.  F. here today for evaluation of postmenopausal bleeding and thickened endometrium (12.6 mm) in the setting of morbid obesity (BMI 52), multiple co-morbidities and impaired mobility (wheelchair bound). She reports having heavy bleeding in the past, but in the last year she has 1-2 days of light bleeding every month, sometimes twice a month.  Was seen in clinic on 09/17/2011 and started on Megace 60 mg po daily while awating surgery.  She had a recent admission for fever and cellulitis, also noted to have acute renal injury, discharged on 11/09/2011.  No symptoms of anemia, recent hemoglobin on 11/16/11 was 10.7. No other GYN symptoms.  No recent pap smear on chart, last pap smear was in 2008.  Proposed surgery: Exam under anesthesia, pap smear, hysteroscopy, dilation and curettage.  Past Medical History  Diagnosis Date  . Primary Sjogren's syndrome     anti Ro+;ANA>1:1280 in homogen pattern, negative ds DNA/RF/anti Smith/RNP/C3-4 comp/la/jo1/Scleroderma/centromere, neg HIV/ACE/Hep B/C, nl CXR 2/05, schirmer salivary glandtest not done, symptom rx:eye drops/prednisone/plaquenil referral to [facility] rheum, [name] 3/07. saw [name] but stopped 2/2 cost.  . Hypertension     no LVH EKG-7/05, nl M/C ratio 4/07  . Morbid obesity   . Back pain     chronic low back pain L4-5 discectomy:11/99. degenerative thoracic spondylotic changes 4/07  . Obstructive sleep apnea     severe 7/05 RD 161 per hr. /CPAP 18cwp  . Hypothyroidism     09/1998  . Microcytic anemia     09/1998. Hb-10.5/MCV 76. needs ferritin to determine ACD vs IDA  . Menorrhagia   . Uterine bleeding   . Lower extremity edema     echo EF 55-65% w/o evidence of Dias dysfx.,   . Polyarthralgia     (knee/back/ankle) w/dx of fibromyalgia? given by [facility] rheum, knee pain chronic 2/2 obesity, fibromyalgia and Sjogren's.  . Chest pain     neg adenosine myoview.   Past  Surgical History  Procedure Date  . Tonsillectomy   . Lumbar fusion 1999  . Tubal ligation   . Shoulder surgery unk   GYN History: Postmenopausal. Patient denies any cervical dysplasia or STIs.  Prescriptions prior to admission  Medication Sig Dispense Refill  . levothyroxine (LEVOTHROID) 50 MCG tablet Take 1 tablet (50 mcg total) by mouth daily.  90 tablet  1  . metoprolol (LOPRESSOR) 50 MG tablet Take 50 mg by mouth 2 (two) times daily.       Marland Kitchen albuterol (PROAIR HFA) 108 (90 BASE) MCG/ACT inhaler Inhale 1 puff into the lungs every 6 (six) hours as needed for wheezing.  1 Inhaler  2  . aspirin EC 81 MG tablet Take 1 tablet (81 mg total) by mouth daily.  150 tablet  2  . chlorthalidone (HYGROTON) 25 MG tablet Take 1 tablet (25 mg total) by mouth daily.  90 tablet  1  . ferrous sulfate 325 (65 FE) MG tablet Take 325 mg by mouth 3 (three) times daily with meals.       . fluticasone (FLONASE) 50 MCG/ACT nasal spray Place 1 each into both nostrils every morning.      Marland Kitchen HYDROcodone-acetaminophen (VICODIN) 5-500 MG per tablet Take 1 tablet by mouth every 8 (eight) hours as needed. As needed for severe pain.      . megestrol (MEGACE) 20 MG tablet Take 60 mg by mouth daily.        Allergies  Allergen Reactions  .  Amlodipine     Lower extremity swelling.  This reaction only occurred when taking GENERIC amlodipine.  She previously tolerated brand name Norvasc well.  . Coconut Fatty Acids     Break out  . Naproxen Sodium Nausea And Vomiting    Says she can take ibuprofen    Social History:   Reports that she has never smoked. She has never used smokeless tobacco. She reports that she does not drink alcohol or use illicit drugs.  Family History  Problem Relation Age of Onset  . Hypertension Mother   . Cancer Mother   . Cancer Father   . Cancer Sister   . Ovarian cancer      family history  . Breast cancer      family history in 1st degree relatives.  . Thyroid disease Sister     Review of Systems: Noncontributory  PHYSICAL EXAM: Blood pressure 143/87, pulse 81, temperature 98.1 F (36.7 C), temperature source Oral, resp. rate 18, height 5\' 3"  (1.6 m), weight 131.09 kg (289 lb), SpO2 96.00%. General appearance - alert, obese, and in no distress Chest - clear to auscultation Heart - normal rate and regular rhythm Abdomen - soft, obese, nontender Extremities - peripheral pulses normal, no pedal edema, no clubbing or cyanosis  Labs: Recent Results (from the past 336 hour(s))  CBC   Collection Time   11/16/11 10:40 AM      Component Value Range   WBC 5.9  4.0 - 10.5 K/uL   RBC 4.58  3.87 - 5.11 MIL/uL   Hemoglobin 10.7 (*) 12.0 - 15.0 g/dL   HCT 40.9 (*) 81.1 - 91.4 %   MCV 74.7 (*) 78.0 - 100.0 fL   MCH 23.4 (*) 26.0 - 34.0 pg   MCHC 31.3  30.0 - 36.0 g/dL   RDW 78.2  95.6 - 21.3 %   Platelets 325  150 - 400 K/uL  BASIC METABOLIC PANEL   Collection Time   11/16/11 10:40 AM      Component Value Range   Sodium 136  135 - 145 mEq/L   Potassium 3.2 (*) 3.5 - 5.1 mEq/L   Chloride 96  96 - 112 mEq/L   CO2 31  19 - 32 mEq/L   Glucose, Bld 112 (*) 70 - 99 mg/dL   BUN 11  6 - 23 mg/dL   Creatinine, Ser 0.86  0.50 - 1.10 mg/dL   Calcium 8.6  8.4 - 57.8 mg/dL   GFR calc non Af Amer 69 (*) >90 mL/min   GFR calc Af Amer 80 (*) >90 mL/min  BASIC METABOLIC PANEL WITH GFR   Collection Time   11/19/11 10:54 AM      Component Value Range   Sodium 138  135 - 145 mEq/L   Potassium 3.8  3.5 - 5.3 mEq/L   Chloride 99  96 - 112 mEq/L   CO2 30  19 - 32 mEq/L   Glucose, Bld 90  70 - 99 mg/dL   BUN 11  6 - 23 mg/dL   Creat 4.69  6.29 - 5.28 mg/dL   Calcium 9.0  8.4 - 41.3 mg/dL   GFR, Est African American 72     GFR, Est Non African American 62    CBC WITH DIFFERENTIAL   Collection Time   11/19/11 10:54 AM      Component Value Range   WBC 4.7  4.0 - 10.5 K/uL   RBC 4.65  3.87 - 5.11 MIL/uL  Hemoglobin 10.8 (*) 12.0 - 15.0 g/dL   HCT 16.1 (*) 09.6 - 04.5 %    MCV 72.9 (*) 78.0 - 100.0 fL   MCH 23.2 (*) 26.0 - 34.0 pg   MCHC 31.9  30.0 - 36.0 g/dL   RDW 40.9 (*) 81.1 - 91.4 %   Platelets 335  150 - 400 K/uL   Neutrophils Relative 37 (*) 43 - 77 %   Neutro Abs 1.8  1.7 - 7.7 K/uL   Lymphocytes Relative 48 (*) 12 - 46 %   Lymphs Abs 2.3  0.7 - 4.0 K/uL   Monocytes Relative 11  3 - 12 %   Monocytes Absolute 0.5  0.1 - 1.0 K/uL   Eosinophils Relative 3  0 - 5 %   Eosinophils Absolute 0.1  0.0 - 0.7 K/uL   Basophils Relative 1  0 - 1 %   Basophils Absolute 0.0  0.0 - 0.1 K/uL   Smear Review Criteria for review not met     Imaging Studies: 08/25/2011 TRANSABDOMINAL AND TRANSVAGINAL ULTRASOUND OF PELVIS Clinical Data: Post menopausal bleeding. Comparison: 06/18/2009. Findings: Uterus: Measures 8.7 x 4.9 x 5.2 cm. No myometrial abnormalities are demonstrated. Endometrium: 12.6 mm in thickness. This is abnormal for a postmenopausal patient not on hormone replacement therapy. Endometrial sampling is recommended. Right ovary: Not visualized. No adnexal mass. Left ovary: Measures 1.6 x 1.1 x 1.5 cm. No cysts or masses. Other findings: No free fluid IMPRESSION: 1. Thickened endometrium measuring 12.6 mm. Recommend endometrial sampling. 2. Non-visualization of the right ovary. No adnexal mass. 3. Normal left ovary. Original Report Authenticated By: P. Loralie Champagne, M.D.   Assessment: Patient Active Problem List  Diagnosis  . HYPOTHYROIDISM  . Morbid obesity  . ANEMIA, IRON DEFICIENCY NOS  . MACULAR DEGENERATION, BILATERAL  . BENIGN PAROXYSMAL POSITIONAL VERTIGO  . HYPERTENSION  . HEMORRHOIDS  . LUMP OR MASS IN BREAST  . SICCA SYNDROME  . KNEE PAIN, CHRONIC  . BACK PAIN  . SLEEP APNEA  . Lower extremity edema  . Carpal tunnel syndrome of left wrist  . Thickened endometrium  . Cellulitis of ankle  . Abnormal CT of brain  . Postmenopausal bleeding    Plan: Patient will undergo surgical management with exam under anesthesia, pap smear,  hysteroscopy, dilation and curettage. given increased risk for endometrial hyperplasia/neoplasia, morbid obesity and limited mobility. Risks of surgery were discussed with the patient including but not limited to: bleeding; infection which may require antibiotics; injury to uterus or surrounding organs; intrauterine scarring; need for additional procedures including laparotomy or laparoscopy; and other postoperative or anesthesia complications. The patient also understands the alternative treatment options which were discussed in full. All questions were answered. Likelihood of success in alleviating the patient's condition was discussed. Routine postoperative instructions will be reviewed with the patient and her family in detail after surgery.  The patient concurred with the proposed plan, giving informed written consent for the surgery.  Patient has been NPO since last night she will remain NPO for procedure.  Anesthesia and OR aware.  Preoperative SCDs ordered on call to the OR.  To OR when ready.  Jaynie Collins, M.D. 11/23/2011 11:34 AM

## 2011-11-23 NOTE — Anesthesia Preprocedure Evaluation (Signed)
Anesthesia Evaluation  Patient identified by MRN, date of birth, ID band Patient awake    Reviewed: Allergy & Precautions, H&P , Patient's Chart, lab work & pertinent test results, reviewed documented beta blocker date and time   Airway Mallampati: IV TM Distance: >3 FB Neck ROM: full    Dental No notable dental hx.    Pulmonary sleep apnea and Continuous Positive Airway Pressure Ventilation ,  breath sounds clear to auscultation  Pulmonary exam normal       Cardiovascular hypertension, Pt. on medications Rhythm:regular Rate:Normal     Neuro/Psych    GI/Hepatic   Endo/Other  Hypothyroidism   Renal/GU      Musculoskeletal   Abdominal   Peds  Hematology   Anesthesia Other Findings No Hx of Diff Intubation, Good neck mobility, Recent URI sx that turned out to be a leg infection(?), no current sx, Chest clear No CAD sx,BP controlled,  Took today's  BBlockers, EKG okay.  Reproductive/Obstetrics                           Anesthesia Physical Anesthesia Plan  ASA: III  Anesthesia Plan: General   Post-op Pain Management:    Induction: Intravenous  Airway Management Planned: LMA  Additional Equipment:   Intra-op Plan:   Post-operative Plan:   Informed Consent: I have reviewed the patients History and Physical, chart, labs and discussed the procedure including the risks, benefits and alternatives for the proposed anesthesia with the patient or authorized representative who has indicated his/her understanding and acceptance.   Dental Advisory Given  Plan Discussed with: CRNA and Surgeon  Anesthesia Plan Comments: (  Discussed  general anesthesia, including possible nausea, instrumentation of airway, sore throat,pulmonary aspiration, etc. I asked if the were any outstanding questions, or  concerns before we proceeded. )        Anesthesia Quick Evaluation

## 2011-11-23 NOTE — Op Note (Signed)
PREOPERATIVE DIAGNOSIS:  Postmenopausal bleeding, morbid obesity, impaired mobility, needs pap smear. POSTOPERATIVE DIAGNOSIS: The same PROCEDURE: Exam under anesthesia, pap smear, diagnostic hysteroscopy, dilation and curettage. SURGEON:  Dr. Jaynie Collins   INDICATIONS: 56 y.o. F with postmenopausal bleeding, impaired mobility here for the aforementioned procedures.   Risks of surgery were discussed with the patient including but not limited to: bleeding  infection which may require antibiotics; injury to uterus or surrounding organs; intrauterine scarring; need for additional procedures including laparotomy or laparoscopy; and other postoperative/anesthesia complications. Written informed consent was obtained.    FINDINGS:  8 week size uterus.  Diffuse proliferative endometrium with polypoid lesions noted  Normal ostia bilaterally.  ANESTHESIA:   General, paracervical block. INTRAVENOUS FLUIDS:  800 ml of LR FLUID DEFICITS:  200 ml of Glycine ESTIMATED BLOOD LOSS:  25 ml SPECIMENS: Pap smear and Endometrial curettings sent to pathology COMPLICATIONS:  None immediate.  PROCEDURE DETAILS:  The patient received intravenous antibiotics while in the preoperative area.  She was then taken to the operating room where general anesthesia was administered and was found to be adequate.  After an adequate timeout was performed, she was placed in the dorsal lithotomy position and examined, speculum was placed and pap smear was obtained.  This speculum was removed, and she was prepped and draped in the sterile manner.   Her bladder was catheterized for an unmeasured amount of clear, yellow urine.  A sterile speculum was then placed in the patient's vagina and a single tooth tenaculum was applied to the anterior lip of the cervix.   A paracervical block using 30 ml of 0.5% Marcaine was administered.  The cervix was sounded to 8 cm and dilated manually with metal dilators to accommodate the 5 mm diagnostic  hysteroscope.  Once the cervix was dilated, the hysteroscope was inserted under direct visualization using glycine as a suspension medium.  The uterine cavity was carefully examined, both ostia were recognized, and diffusely proliferative endometrium with polypoid lesions was noted.   After further careful visualization of the uterine cavity, the hysteroscope was removed under direct visualization.  A sharp curettage was then performed to obtain a moderate amount of endometrial curettings and polypoid fragments.  The tenaculum was removed from the anterior lip of the cervix and the vaginal speculum was removed after noting good hemostasis.  The patient tolerated the procedure well and was taken to the recovery area awake, extubated and in stable condition.  The patient will be discharged to home as per PACU criteria.  Routine postoperative instructions given.  She was prescribed Percocet and Colace.  She will follow up in the clinic on 12/09/11  for postoperative evaluation.

## 2011-11-23 NOTE — Transfer of Care (Signed)
Immediate Anesthesia Transfer of Care Note  Patient: Nancy [name]  Procedure(s) Performed: Procedure(s) (LRB) with comments: DILATATION AND CURETTAGE /HYSTEROSCOPY (N/A) EXAM UNDER ANESTHESIA (N/A)  Patient Location: PACU  Anesthesia Type: General  Level of Consciousness: awake, oriented and patient cooperative  Airway & Oxygen Therapy: Patient Spontanous Breathing and Patient connected to nasal cannula oxygen  Post-op Assessment: Report given to PACU RN and Post -op Vital signs reviewed and stable  Post vital signs: Reviewed and stable  Complications: No apparent anesthesia complications

## 2011-11-24 ENCOUNTER — Encounter (HOSPITAL_COMMUNITY): Payer: Self-pay | Admitting: Obstetrics & Gynecology

## 2011-11-27 ENCOUNTER — Telehealth: Payer: Self-pay | Admitting: *Deleted

## 2011-11-27 NOTE — Telephone Encounter (Signed)
Pt informed

## 2011-11-27 NOTE — Telephone Encounter (Signed)
Message copied by Mannie Stabile on Fri Nov 27, 2011  9:15 AM ------      Message from: Jaynie Collins A      Created: Thu Nov 26, 2011  5:07 PM       Normal pap smear and negative HRHPV.  Please call to inform patient of results.

## 2011-12-09 ENCOUNTER — Ambulatory Visit (INDEPENDENT_AMBULATORY_CARE_PROVIDER_SITE_OTHER): Payer: Medicare Other | Admitting: Obstetrics & Gynecology

## 2011-12-09 ENCOUNTER — Encounter: Payer: Self-pay | Admitting: Obstetrics & Gynecology

## 2011-12-09 VITALS — BP 143/66 | HR 91 | Temp 98.9°F | Resp 20 | Ht 63.0 in | Wt 282.4 lb

## 2011-12-09 DIAGNOSIS — N950 Postmenopausal bleeding: Secondary | ICD-10-CM

## 2011-12-09 DIAGNOSIS — R9389 Abnormal findings on diagnostic imaging of other specified body structures: Secondary | ICD-10-CM

## 2011-12-09 DIAGNOSIS — Z09 Encounter for follow-up examination after completed treatment for conditions other than malignant neoplasm: Secondary | ICD-10-CM

## 2011-12-09 MED ORDER — MEGESTROL ACETATE 40 MG PO TABS: 80.0000 mg | ORAL_TABLET | Freq: Every day | ORAL | Status: DC

## 2011-12-09 NOTE — Patient Instructions (Signed)
Return to clinic for any scheduled appointments or for any gynecologic concerns as needed.   

## 2011-12-09 NOTE — Progress Notes (Signed)
History:  56 y.o. F here today for postoperative followup after an EUA, pap,hysteroscopy and D&C for postmenopausla bleeding. She continues to have light bleeding most days, but less significant than before surgery.   The following portions of the patient's history were reviewed and updated as appropriate: allergies, current medications, past family history, past medical history, past social history, past surgical history and problem list.  Review of Systems:  Pertinent items are noted in HPI.  Objective:  Physical Exam Blood pressure 143/66, pulse 91, temperature 98.9 F (37.2 C), temperature source Oral, resp. rate 20, height 5\' 3"  (1.6 m), weight 282 lb 6.4 oz (128.096 kg). Exam Deferred  Labs and Imaging 11/23/11 Surgical Pathology  Endometrium, curettage - BENIGN ENDOMETRIAL POLYP AND ADJACENT BENIGN SECRETORY ENDOMETRIUM. - NO ATYPIA, HYPERPLASIA OR MALIGNANCY. Pap Smear Normal, negative HPV  Assessment & Plan:  Pathology results discussed with patient, she was reassured. Increased Megace to 80 mg po daily. Will return in 2 months for followup, Bleeding precautions reviewed.

## 2011-12-10 ENCOUNTER — Other Ambulatory Visit: Payer: Self-pay | Admitting: *Deleted

## 2011-12-10 NOTE — Telephone Encounter (Signed)
Last filled 07/24/11

## 2011-12-11 MED ORDER — HYDROCODONE-ACETAMINOPHEN 5-500 MG PO TABS: 1.0000 | ORAL_TABLET | Freq: Three times a day (TID) | ORAL | Status: DC | PRN

## 2011-12-11 NOTE — Telephone Encounter (Signed)
Called to pharm 

## 2011-12-28 ENCOUNTER — Ambulatory Visit: Payer: Medicare Other | Admitting: Internal Medicine

## 2011-12-29 ENCOUNTER — Ambulatory Visit (INDEPENDENT_AMBULATORY_CARE_PROVIDER_SITE_OTHER): Payer: Medicare Other | Admitting: Internal Medicine

## 2011-12-29 ENCOUNTER — Encounter: Payer: Self-pay | Admitting: Internal Medicine

## 2011-12-29 VITALS — BP 167/91 | HR 86 | Temp 97.7°F | Ht 63.0 in | Wt 288.0 lb

## 2011-12-29 DIAGNOSIS — B369 Superficial mycosis, unspecified: Secondary | ICD-10-CM

## 2011-12-29 DIAGNOSIS — L03114 Cellulitis of left upper limb: Secondary | ICD-10-CM

## 2011-12-29 DIAGNOSIS — IMO0002 Reserved for concepts with insufficient information to code with codable children: Secondary | ICD-10-CM

## 2011-12-29 MED ORDER — CLOTRIMAZOLE POWD: Status: DC

## 2011-12-29 MED ORDER — CEPHALEXIN 500 MG PO CAPS: 500.0000 mg | ORAL_CAPSULE | Freq: Four times a day (QID) | ORAL | Status: AC

## 2011-12-29 NOTE — Progress Notes (Signed)
  Subjective:    Patient ID: Nancy [name], female    DOB: 11-16-1955, 56 y.o.   MRN: [id number]  HPI Presents with complaints of left underarm itching, pain and discharge x 1 week.  Has a hx significant for morbid obesity, sicca syndrome, post menopausal bleeding on megace x1 week and cellulitis of right ankle Aug 2013.  Denies previous h/o of abscesses, chest pain, sob.   Review of Systems  Constitutional: Negative for fever and chills.  Respiratory: Negative for shortness of breath.   Cardiovascular: Negative for chest pain.  Skin: Positive for color change, rash and wound.  Neurological: Negative for dizziness and weakness.       Objective:   Physical Exam  Constitutional: She appears well-developed and well-nourished. No distress.       Morbidly obese in wheelchair with family member present  HENT:  Head: Normocephalic and atraumatic.  Neck: Normal range of motion. Neck supple.  Skin: There is erythema.     Psychiatric: She has a normal mood and affect.          Assessment & Plan:  1. LUE cellulitis: likely fungal with superimposed bacterial infection -tx with Clotrimazole powder bid x 7days and Cephalexin 500 mg bid x 10 days

## 2011-12-29 NOTE — Patient Instructions (Signed)
Keep your skin dry as best as possible.  This can be helped with cornstarch to the areas that we discussed. Fill the prescription for antibiotic and antifungal.  This will take care of the itching and pain. Follow-up with your PCP as needed.

## 2012-01-14 ENCOUNTER — Other Ambulatory Visit: Payer: Self-pay | Admitting: Internal Medicine

## 2012-01-19 ENCOUNTER — Telehealth: Payer: Self-pay | Admitting: *Deleted

## 2012-01-19 NOTE — Telephone Encounter (Signed)
Pt calls and states she has a "terrible cough", ongoing for "several" days, would like some cough syrup, appt is made for pt according to her schedule

## 2012-01-20 ENCOUNTER — Ambulatory Visit (INDEPENDENT_AMBULATORY_CARE_PROVIDER_SITE_OTHER): Payer: Medicare Other | Admitting: Internal Medicine

## 2012-01-20 ENCOUNTER — Encounter: Payer: Self-pay | Admitting: Internal Medicine

## 2012-01-20 DIAGNOSIS — J209 Acute bronchitis, unspecified: Secondary | ICD-10-CM

## 2012-01-20 DIAGNOSIS — I10 Essential (primary) hypertension: Secondary | ICD-10-CM

## 2012-01-20 MED ORDER — CHLORTHALIDONE 25 MG PO TABS: 25.0000 mg | ORAL_TABLET | Freq: Every day | ORAL | Status: DC

## 2012-01-20 MED ORDER — GUAIFENESIN-CODEINE 100-10 MG/5ML PO SYRP: 5.0000 mL | ORAL_SOLUTION | Freq: Three times a day (TID) | ORAL | Status: DC | PRN

## 2012-01-20 NOTE — Patient Instructions (Addendum)
Stop taking amlodipine.  Restart taking chlorthalidone 25mg  daily.  Use your albuterol more frequently over the next few days.  Return to the clinic if symptoms worsen, you start running fevers, or if symptoms are not improving in 1 to 2 weeks.  Acute Bronchitis  You have acute bronchitis. This means you have a chest cold. The airways in your lungs are red and sore (inflamed). Acute means it is sudden onset.  CAUSES  Bronchitis is most often caused by the same virus that causes a cold.  SYMPTOMS  Body aches.  Chest congestion.  Chills.  Cough.  Fever.  Shortness of breath.  Sore throat.  TREATMENT  Acute bronchitis is usually treated with rest, fluids, and medicines for relief of fever or cough. Most symptoms should go away after a few days or a week. Increased fluids may help thin your secretions and will prevent dehydration. Your caregiver may give you an inhaler to improve your symptoms. The inhaler reduces shortness of breath and helps control cough. You can take over-the-counter pain relievers or cough medicine to decrease coughing, pain, or fever. A cool-air vaporizer may help thin bronchial secretions and make it easier to clear your chest.  Antibiotics are usually not needed but can be prescribed if you smoke, are seriously ill, have chronic lung problems, are elderly, or you are at higher risk for developing complications. Allergies and asthma can make bronchitis worse. Repeated episodes of bronchitis may cause longstanding lung problems.  Avoid smoking and secondhand smoke. Exposure to cigarette smoke or irritating chemicals will make bronchitis worse. If you are a cigarette smoker, consider using nicotine gum or skin patches to help control withdrawal symptoms. Quitting smoking will help your lungs heal faster.  Recovery from bronchitis is often slow, but you should start feeling better after 2 to 3 days. Cough from bronchitis frequently lasts for 3 to 4 weeks.  To prevent another  bout of acute bronchitis:  Quit smoking.  Wash your hands frequently to get rid of viruses or use a hand sanitizer.  Avoid other people with cold or virus symptoms.  Try not to touch your hands to your mouth, nose, or eyes.  SEEK IMMEDIATE MEDICAL CARE IF:  You develop increased fever, chills, or chest pain.  You have severe shortness of breath or bloody sputum.  You develop dehydration, fainting, repeated vomiting, or a severe headache.  You have no improvement after 1 week of treatment or you get worse.  MAKE SURE YOU:  Understand these instructions.  Will watch your condition.  Will get help right away if you are not doing well or get worse.  Document Released: 04/02/2004 Document Revised: 05/18/2011 Document Reviewed: 06/18/2010  [facility] Patient Information 2013 [place], [place].

## 2012-01-20 NOTE — Assessment & Plan Note (Signed)
Patient has not been taking amlodipine, but was taking chlorthalidone and metoprolol.  Despite being out of chlorthalidone for several days, her blood pressure is very near controlled (BP 141/88).  I have instructed her to restart taking chlorthalidone (rx sent) and continue taking metoprolol.  She will stop taking amlodipine. - stop amlodipine - restart chlorthalidone - continue metoprolol

## 2012-01-20 NOTE — Progress Notes (Signed)
  Subjective:    Patient ID: Nancy [name], female    DOB: 1955-04-21, 56 y.o.   MRN: [id number]  CC: cough  HPI: 56 year old woman with hypertension who presents with acute complaint of cough.  Onset was 9 days ago with cough productive of light green sputum.  Since then, cough has persisted and sputum has darkened slightly.  Associated symptoms include runny nose, watery eyes, sinonasal congestion, sore throat, and left ear fullness and pain.  She has also had fatigue over the past 2 to 3 days.  When in a coughing fit, she has some mild dyspnea, chest pain, and headache.  She denies fevers, chills, diaphoresis, sneezing, ear discharge, nausea, vomiting, diarrhea, abdominal pain, arthralgias, and myalgias.  Also, she recently ran out of her chlorthalidone and requested a refill.  Dr. Eben Burow, on 01/14/2012, sent in a rx for amlodipine instead.    Review of Systems  Constitutional: Positive for fatigue. Negative for fever, chills and diaphoresis.  HENT: Positive for hearing loss (left), ear pain (left), congestion, sore throat, rhinorrhea and sinus pressure. Negative for sneezing and ear discharge.   Eyes:       Watery eyes  Respiratory: Positive for cough and shortness of breath (with cough only).   Cardiovascular: Positive for chest pain (with cough only).  Gastrointestinal: Negative for nausea, vomiting, abdominal pain, diarrhea and constipation.  Musculoskeletal: Negative for myalgias and arthralgias.  Neurological: Positive for headaches (with cough only).  Hematological: Negative for adenopathy.       Objective:   Physical Exam GENERAL: obese; no acute distress HEAD: atraumatic, normocephalic EYES: pupils equal, round and reactive; sclera anicteric; normal conjunctiva EARS: canals normal bilaterally, right TM retracted, left TM with middle ear effusion NOSE/THROAT: oropharynx clear, moist mucous membranes, nasal turbinates swollen and erythematous NECK: supple and obese, thyroid normal  in size and without palpable nodules LYMPH: no cervical or supraclavicular lymphadenopathy LUNGS: clear to auscultation bilaterally, normal work of breathing HEART: normal rate and regular rhythm; normal S1 and S2 without S3 or S4; no murmurs, rubs, or clicks ABDOMEN: soft, non-tender, normal bowel sounds       Assessment & Plan:

## 2012-01-20 NOTE — Assessment & Plan Note (Signed)
Productive cough consistent with acute bronchitis.  Do not suspect pneumonia as patient is afebrile, has a normal oxygen saturation, and exam is without rales or evidence of a consolidation.  Presumed to be viral, as acute bronchitis usually is, and we will treat symptomatically with increase albuterol usage and Robitussin AC PRN. - use albuterol more regularly over the next few days - rx sent for robitussin [facility] for cough - RTC if symptoms worsen or fail to improve

## 2012-04-08 ENCOUNTER — Other Ambulatory Visit: Payer: Self-pay | Admitting: *Deleted

## 2012-04-08 MED ORDER — HYDROCODONE-ACETAMINOPHEN 5-325 MG PO TABS: 1.0000 | ORAL_TABLET | Freq: Four times a day (QID) | ORAL | Status: DC | PRN

## 2012-04-08 NOTE — Telephone Encounter (Signed)
Last time filled 10/3.  Note amount of tylenol.

## 2012-05-30 ENCOUNTER — Encounter: Payer: Self-pay | Admitting: Internal Medicine

## 2012-05-30 ENCOUNTER — Ambulatory Visit (INDEPENDENT_AMBULATORY_CARE_PROVIDER_SITE_OTHER): Payer: Medicare Other | Admitting: Internal Medicine

## 2012-05-30 VITALS — BP 178/96 | HR 91 | Temp 98.2°F | Ht 63.0 in | Wt 281.4 lb

## 2012-05-30 DIAGNOSIS — I10 Essential (primary) hypertension: Secondary | ICD-10-CM

## 2012-05-30 DIAGNOSIS — M549 Dorsalgia, unspecified: Secondary | ICD-10-CM

## 2012-05-30 DIAGNOSIS — M25569 Pain in unspecified knee: Secondary | ICD-10-CM

## 2012-05-30 MED ORDER — HYDROCODONE-ACETAMINOPHEN 5-325 MG PO TABS: 1.0000 | ORAL_TABLET | Freq: Four times a day (QID) | ORAL | Status: DC | PRN

## 2012-05-30 MED ORDER — BACLOFEN 10 MG PO TABS: 10.0000 mg | ORAL_TABLET | Freq: Three times a day (TID) | ORAL | Status: DC

## 2012-05-30 NOTE — Assessment & Plan Note (Signed)
Right knee pain worse since last 10 days. Likely related to osteoarthritis as she is morbidly obese. Patient offered knee steroid injection which she refused. Patient agrees to take baclofen and vicodin for few days and if not better will come back for injection.

## 2012-05-30 NOTE — Patient Instructions (Signed)
Back Pain, Adult Low back pain is very common. About 1 in 5 people have back pain.The cause of low back pain is rarely dangerous. The pain often gets better over time.About half of people with a sudden onset of back pain feel better in just 2 weeks. About 8 in 10 people feel better by 6 weeks.  CAUSES Some common causes of back pain include:  Strain of the muscles or ligaments supporting the spine.  Wear and tear (degeneration) of the spinal discs.  Arthritis.  Direct injury to the back. DIAGNOSIS Most of the time, the direct cause of low back pain is not known.However, back pain can be treated effectively even when the exact cause of the pain is unknown.Answering your caregiver's questions about your overall health and symptoms is one of the most accurate ways to make sure the cause of your pain is not dangerous. If your caregiver needs more information, he or she may order lab work or imaging tests (X-rays or MRIs).However, even if imaging tests show changes in your back, this usually does not require surgery. HOME CARE INSTRUCTIONS For many people, back pain returns.Since low back pain is rarely dangerous, it is often a condition that people can learn to manageon their own.   Remain active. It is stressful on the back to sit or stand in one place. Do not sit, drive, or stand in one place for more than 30 minutes at a time. Take short walks on level surfaces as soon as pain allows.Try to increase the length of time you walk each day.  Do not stay in bed.Resting more than 1 or 2 days can delay your recovery.  Do not avoid exercise or work.Your body is made to move.It is not dangerous to be active, even though your back may hurt.Your back will likely heal faster if you return to being active before your pain is gone.  Pay attention to your body when you bend and lift. Many people have less discomfortwhen lifting if they bend their knees, keep the load close to their bodies,and  avoid twisting. Often, the most comfortable positions are those that put less stress on your recovering back.  Find a comfortable position to sleep. Use a firm mattress and lie on your side with your knees slightly bent. If you lie on your back, put a pillow under your knees.  Only take over-the-counter or prescription medicines as directed by your caregiver. Over-the-counter medicines to reduce pain and inflammation are often the most helpful.Your caregiver may prescribe muscle relaxant drugs.These medicines help dull your pain so you can more quickly return to your normal activities and healthy exercise.  Put ice on the injured area.  Put ice in a plastic bag.  Place a towel between your skin and the bag.  Leave the ice on for 15 to 20 minutes, 3 to 4 times a day for the first 2 to 3 days. After that, ice and heat may be alternated to reduce pain and spasms.  Ask your caregiver about trying back exercises and gentle massage. This may be of some benefit.  Avoid feeling anxious or stressed.Stress increases muscle tension and can worsen back pain.It is important to recognize when you are anxious or stressed and learn ways to manage it.Exercise is a great option. SEEK MEDICAL CARE IF:  You have pain that is not relieved with rest or medicine.  You have pain that does not improve in 1 week.  You have new symptoms.  You are generally   not feeling well. SEEK IMMEDIATE MEDICAL CARE IF:   You have pain that radiates from your back into your legs.  You develop new bowel or bladder control problems.  You have unusual weakness or numbness in your arms or legs.  You develop nausea or vomiting.  You develop abdominal pain.  You feel faint. Document Released: 02/23/2005 Document Revised: 08/25/2011 Document Reviewed: 07/14/2010 [employer] Patient Information 2013 [employer].  

## 2012-05-30 NOTE — Progress Notes (Signed)
  Subjective:    Patient ID: Nancy [name], female    DOB: 1955/07/31, 57 y.o.   MRN: [id number]  Knee Pain   Back Pain Pertinent negatives include no abdominal pain, chest pain, fever or headaches.    Patient comes in for an acute appointment for back pain 8/10, radiating to hips and both legs rt>left, no difficulty urination, no change in BM, no fever or chills noted. Patient was unable to move yesterday due to back pain, any movement makes the pain worse and it is better with rest.  Right knee is hurting since last 10 days. Right in the middle of the joint, worse with walking and not with rest, patient is able to move the leg in all directions with some restriction due to pain.   BP is high due to pain.  No other complaints. Uses electric wheelchair to get around.    Review of Systems  Constitutional: Negative for fever, activity change and appetite change.  HENT: Negative for sore throat.   Respiratory: Negative for cough and shortness of breath.   Cardiovascular: Negative for chest pain and leg swelling.  Gastrointestinal: Negative for nausea, abdominal pain, diarrhea, constipation and abdominal distention.  Genitourinary: Negative for frequency, hematuria and difficulty urinating.  Musculoskeletal: Positive for back pain, arthralgias and gait problem.  Neurological: Negative for dizziness and headaches.  Psychiatric/Behavioral: Negative for suicidal ideas and behavioral problems.       Objective:   Physical Exam  Constitutional: She is oriented to person, place, and time. She appears well-developed and well-nourished.  HENT:  Head: Normocephalic and atraumatic.  Eyes: Conjunctivae and EOM are normal. Pupils are equal, round, and reactive to light. No scleral icterus.  Neck: Normal range of motion. Neck supple. No JVD present. No thyromegaly present.  Cardiovascular: Normal rate, regular rhythm, normal heart sounds and intact distal pulses.  Exam reveals no gallop and no  friction rub.   No murmur heard. Pulmonary/Chest: Effort normal and breath sounds normal. No respiratory distress. She has no wheezes. She has no rales.  Abdominal: Soft. Bowel sounds are normal. She exhibits no distension and no mass. There is no tenderness. There is no rebound and no guarding.  Musculoskeletal: Normal range of motion. She exhibits no edema and no tenderness.  Lymphadenopathy:    She has no cervical adenopathy.  Neurological: She is alert and oriented to person, place, and time.  Psychiatric: She has a normal mood and affect. Her behavior is normal.          Assessment & Plan:

## 2012-05-30 NOTE — Assessment & Plan Note (Signed)
High but likely related to pain. No interventions today. Follow up in 10 days after pain is better.

## 2012-05-30 NOTE — Assessment & Plan Note (Signed)
Acute exacerbation of chronic back pain. Baclofen for muscle relaxation. Local heating pad application. Lot of water. Pain control and no change in functional activities. Reevaluate in few days.

## 2012-07-13 ENCOUNTER — Other Ambulatory Visit: Payer: Self-pay | Admitting: Internal Medicine

## 2012-09-05 IMAGING — CT CT HEAD W/O CM
1 of 2 series · 13 of 30 positions shown, 17 images · non-contrast
Comparison: Head CT 06/08/2008.

CLINICAL DATA: Fever, chills and headache.

CT HEAD WITHOUT CONTRAST
TECHNIQUE: Contiguous axial images were obtained from the base of
the skull through the vertex without contrast.

[Series 2: brain · axial · 0.49mm/px · z∈[+148,+264]mm · 13 of 32 slices shown, 17 images]
[im 3/32  brain]
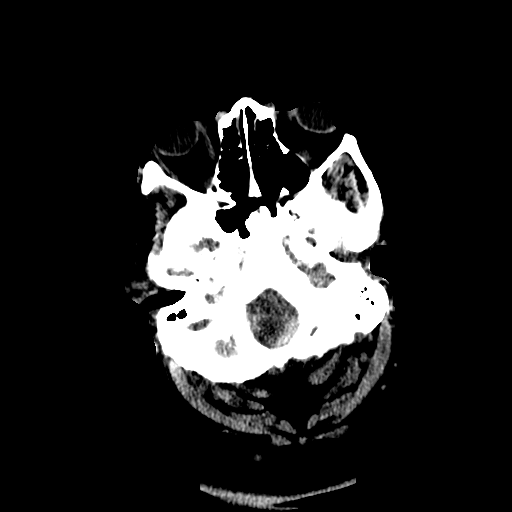
[im 3/32  bone]
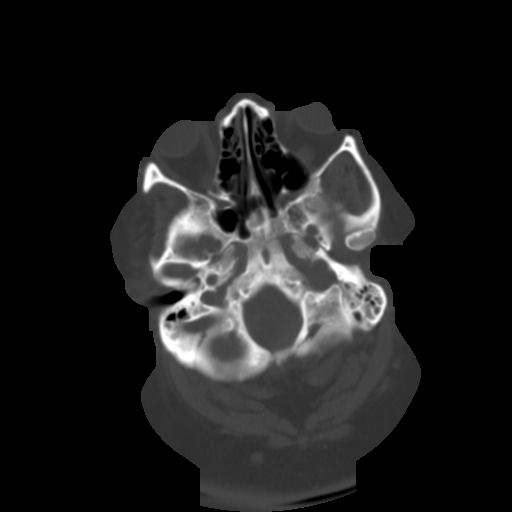
[im 5/32  brain]
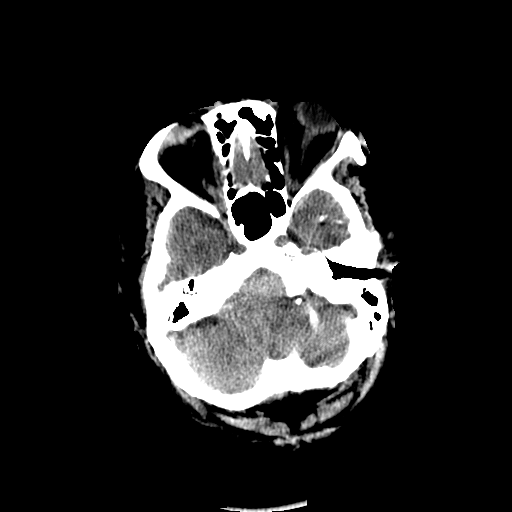
[im 7/32  brain]
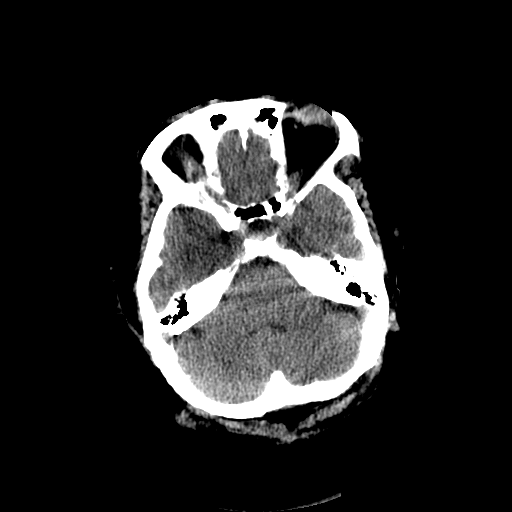
[im 9/32  brain]
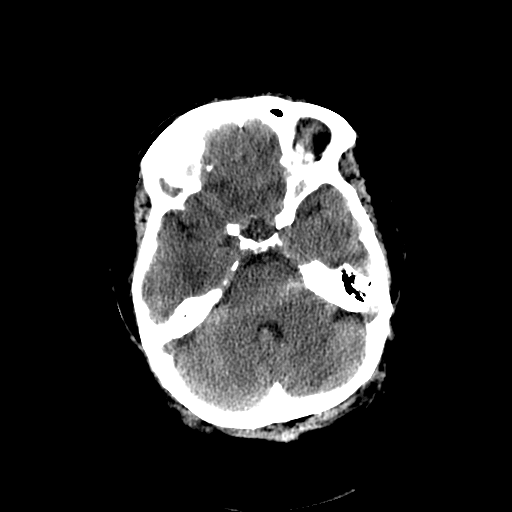
[im 12/32  brain]
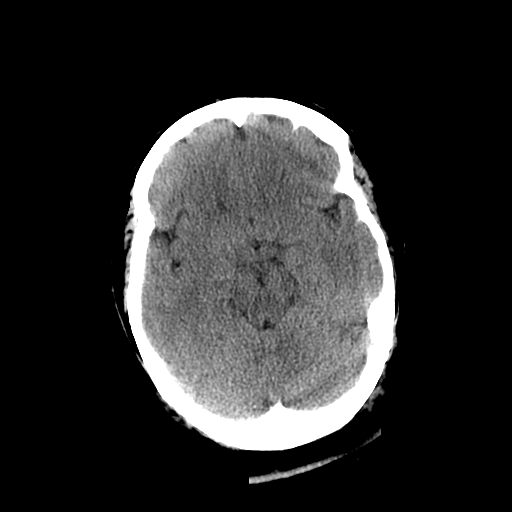
[im 12/32  bone]
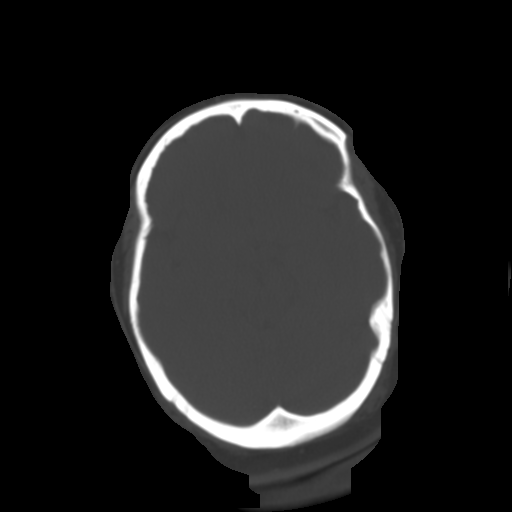
[im 14/32  brain]
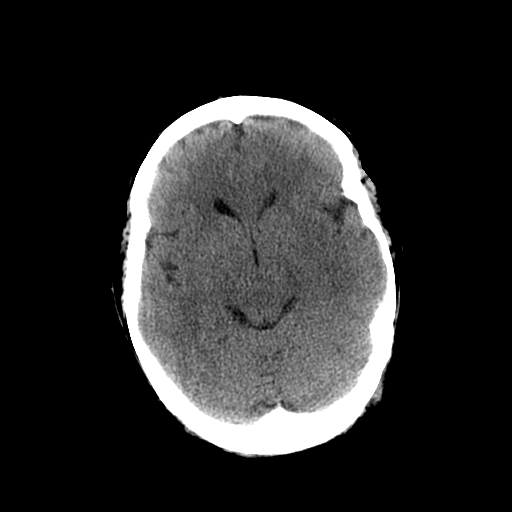
[im 16/32  brain]
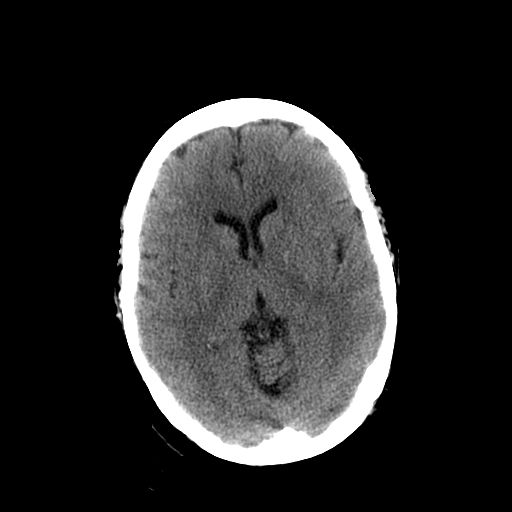
[im 18/32  brain]
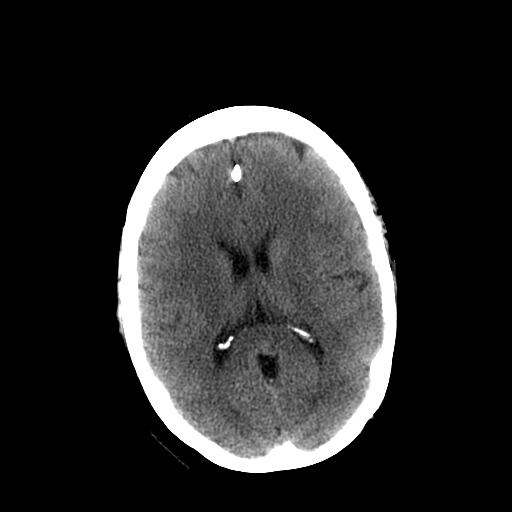
[im 20/32  brain]
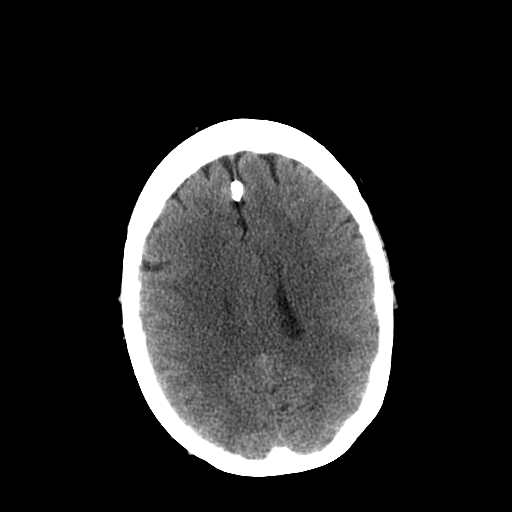
[im 20/32  bone]
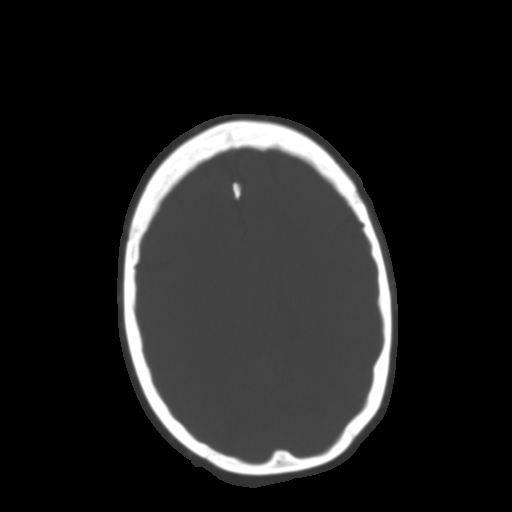
[im 23/32  brain]
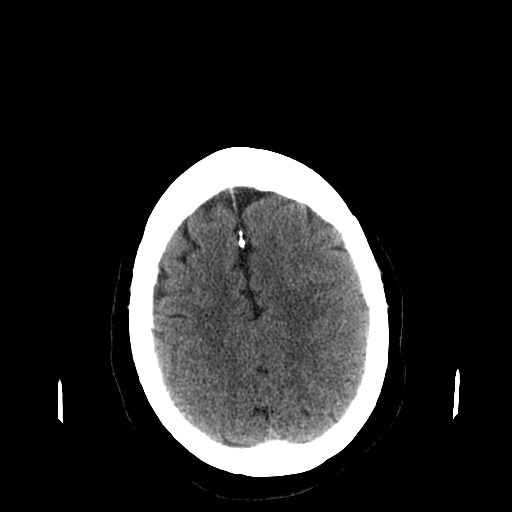
[im 25/32  brain]
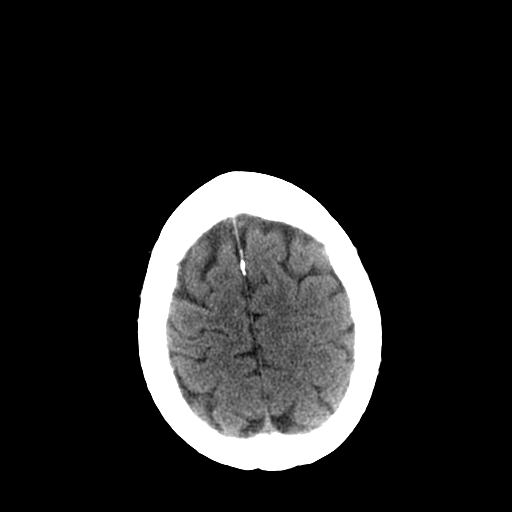
[im 27/32  brain]
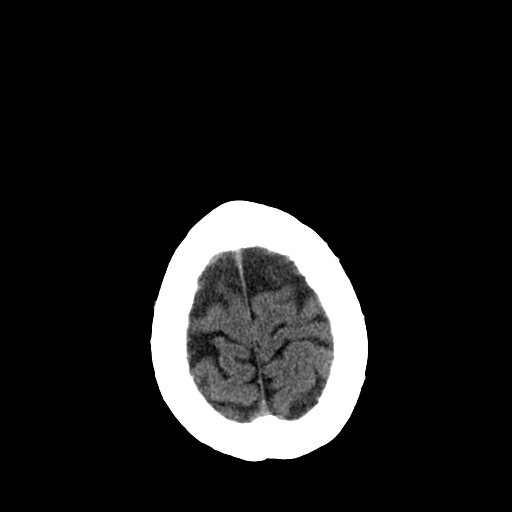
[im 29/32  brain]
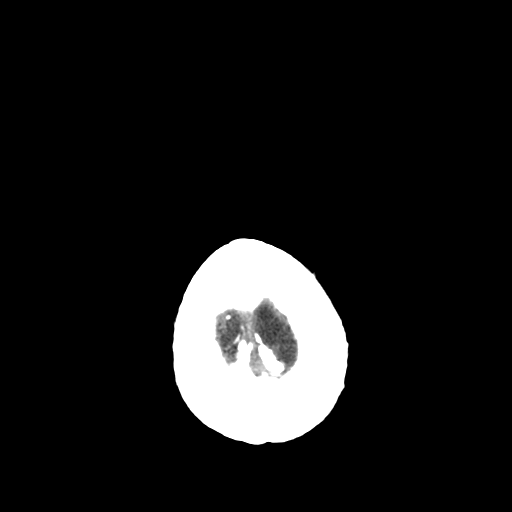
[im 29/32  bone]
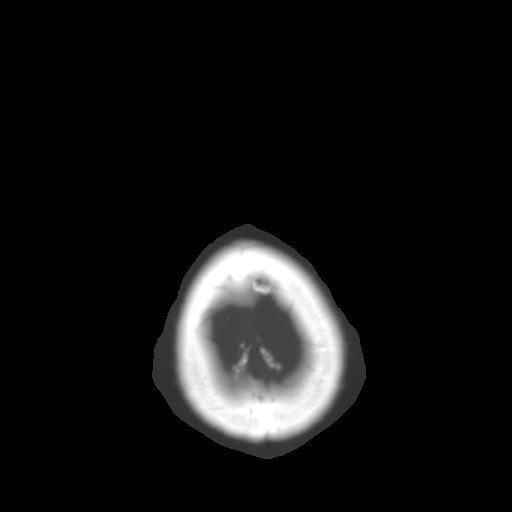

[13 of 30 positions shown; findings below may reference images not displayed]

FINDINGS: There is a fullness and crowding of structures in the
region of the foramen magnum, which is suspicious for low lying
cerebellar tonsils, which could be indicative of a Chiari 1
malformation.  No associated hydrocephalus at this time.  Faint
physiologic calcifications of the basal ganglia bilaterally.  No
acute intracranial abnormality is otherwise identified.
Specifically, no evidence of acute intracerebral hemorrhage, no
definite findings to suggest acute/subacute cerebral ischemia, no
focal mass or abnormal intra or extra-axial fluid collections.
Visualized paranasal sinuses and mastoids are remarkable for
chronic bilateral mastoid effusions (similar to prior).  No acute
displaced skull fractures are identified.
IMPRESSION: 1.  Findings, as above, concerning for potential Chiari 1
malformation.  No associated hydrocephalus at this time. This may
be an etiology for the patient's headache.  This could be confirmed
with a brain MRI.
2.  No other potentially acute findings in the brain.
3.  Chronic bilateral mastoid effusions, similar to prior study
06/08/2008.

## 2012-11-14 ENCOUNTER — Ambulatory Visit (HOSPITAL_COMMUNITY)
Admission: RE | Admit: 2012-11-14 | Discharge: 2012-11-14 | Disposition: A | Discharge status: Home / Self Care | Payer: Medicare Other | Source: Ambulatory Visit | Attending: Internal Medicine | Admitting: Internal Medicine

## 2012-11-14 ENCOUNTER — Encounter: Payer: Self-pay | Admitting: Internal Medicine

## 2012-11-14 ENCOUNTER — Ambulatory Visit (INDEPENDENT_AMBULATORY_CARE_PROVIDER_SITE_OTHER): Payer: Medicare Other | Admitting: Internal Medicine

## 2012-11-14 VITALS — BP 186/100 | HR 81 | Temp 98.6°F | Ht 63.0 in | Wt 276.8 lb

## 2012-11-14 DIAGNOSIS — J302 Other seasonal allergic rhinitis: Secondary | ICD-10-CM

## 2012-11-14 DIAGNOSIS — M25562 Pain in left knee: Secondary | ICD-10-CM

## 2012-11-14 DIAGNOSIS — J309 Allergic rhinitis, unspecified: Secondary | ICD-10-CM

## 2012-11-14 DIAGNOSIS — M79609 Pain in unspecified limb: Secondary | ICD-10-CM

## 2012-11-14 DIAGNOSIS — R609 Edema, unspecified: Secondary | ICD-10-CM

## 2012-11-14 DIAGNOSIS — R600 Localized edema: Secondary | ICD-10-CM

## 2012-11-14 DIAGNOSIS — M79662 Pain in left lower leg: Secondary | ICD-10-CM

## 2012-11-14 DIAGNOSIS — I10 Essential (primary) hypertension: Secondary | ICD-10-CM

## 2012-11-14 DIAGNOSIS — M549 Dorsalgia, unspecified: Secondary | ICD-10-CM

## 2012-11-14 DIAGNOSIS — M25569 Pain in unspecified knee: Secondary | ICD-10-CM

## 2012-11-14 DIAGNOSIS — M7989 Other specified soft tissue disorders: Secondary | ICD-10-CM | POA: Insufficient documentation

## 2012-11-14 DIAGNOSIS — D509 Iron deficiency anemia, unspecified: Secondary | ICD-10-CM

## 2012-11-14 DIAGNOSIS — H3530 Unspecified macular degeneration: Secondary | ICD-10-CM

## 2012-11-14 LAB — CBC WITH DIFFERENTIAL/PLATELET
Basophils Absolute: 0.1 10*3/uL (ref 0.0–0.1)
Basophils Relative: 1 % (ref 0–1)
Eosinophils Absolute: 0.2 10*3/uL (ref 0.0–0.7)
Eosinophils Relative: 4 % (ref 0–5)
HCT: 34.8 % — ABNORMAL LOW (ref 36.0–46.0)
Hemoglobin: 11.2 g/dL — ABNORMAL LOW (ref 12.0–15.0)
Lymphocytes Relative: 48 % — ABNORMAL HIGH (ref 12–46)
Lymphs Abs: 1.8 10*3/uL (ref 0.7–4.0)
MCH: 24.5 pg — ABNORMAL LOW (ref 26.0–34.0)
MCHC: 32.2 g/dL (ref 30.0–36.0)
MCV: 76 fL — ABNORMAL LOW (ref 78.0–100.0)
Monocytes Absolute: 0.4 10*3/uL (ref 0.1–1.0)
Monocytes Relative: 10 % (ref 3–12)
Neutro Abs: 1.4 10*3/uL — ABNORMAL LOW (ref 1.7–7.7)
Neutrophils Relative %: 37 % — ABNORMAL LOW (ref 43–77)
RBC: 4.58 MIL/uL (ref 3.87–5.11)
RDW: 15 % (ref 11.5–15.5)
WBC: 3.8 10*3/uL — ABNORMAL LOW (ref 4.0–10.5)

## 2012-11-14 LAB — BASIC METABOLIC PANEL WITHOUT GFR
BUN: 10 mg/dL (ref 6–23)
CO2: 26 meq/L (ref 19–32)
Calcium: 8.7 mg/dL (ref 8.4–10.5)
Chloride: 101 meq/L (ref 96–112)
Creat: 0.85 mg/dL (ref 0.50–1.10)
GFR, Est African American: 87 mL/min
GFR, Est Non African American: 75 mL/min
Glucose, Bld: 97 mg/dL (ref 70–99)
Potassium: 4.4 meq/L (ref 3.5–5.3)
Sodium: 136 meq/L (ref 135–145)

## 2012-11-14 MED ORDER — FERROUS SULFATE 325 (65 FE) MG PO TABS: 325.0000 mg | ORAL_TABLET | Freq: Three times a day (TID) | ORAL | Status: DC

## 2012-11-14 MED ORDER — ALBUTEROL SULFATE HFA 108 (90 BASE) MCG/ACT IN AERS: 1.0000 | INHALATION_SPRAY | Freq: Four times a day (QID) | RESPIRATORY_TRACT | Status: DC | PRN

## 2012-11-14 MED ORDER — ASPIRIN EC 81 MG PO TBEC: 81.0000 mg | DELAYED_RELEASE_TABLET | Freq: Every day | ORAL | Status: AC

## 2012-11-14 MED ORDER — CHLORTHALIDONE 25 MG PO TABS: ORAL_TABLET | ORAL | Status: DC

## 2012-11-14 MED ORDER — LEVOTHYROXINE SODIUM 50 MCG PO TABS: ORAL_TABLET | ORAL | Status: DC

## 2012-11-14 MED ORDER — METOPROLOL TARTRATE 50 MG PO TABS: 50.0000 mg | ORAL_TABLET | Freq: Two times a day (BID) | ORAL | Status: DC

## 2012-11-14 MED ORDER — FLUTICASONE PROPIONATE 50 MCG/ACT NA SUSP: 1.0000 | Freq: Every morning | NASAL | Status: DC

## 2012-11-14 MED ORDER — HYDROCODONE-ACETAMINOPHEN 5-325 MG PO TABS: 1.0000 | ORAL_TABLET | Freq: Two times a day (BID) | ORAL | Status: DC | PRN

## 2012-11-14 NOTE — Progress Notes (Signed)
  Subjective:    Patient ID: Nancy [name], female    DOB: 02-Nov-1955, 57 y.o.   MRN: [id number]  HPI Comments: Ms. Lindo is a 57 year old woman with a PMH of HTN, OSA, hypothyroidism, chronic knee pain, back pain and iron deficiency anemia.  She presents with complaint of generalized body pain (L side> R side) that began 1 week ago when she ran out of her pain medication.  She normally takes Vicodin 5/325 twice daily to manage her chronic pain.  The pain she is experiencing now is located at the shoulder, hip and knee joints and is not different from pain she has experienced in the past and which is normally controlled with the Vicodin. She does feel that her left leg is more swollen than usual.   She is experiencing left leg pain when she walks.  She is moving less in the past week secondary to pain.     Review of Systems  Constitutional: Positive for appetite change. Negative for fatigue.  Eyes:       + dry eye  Respiratory: Positive for cough. Negative for shortness of breath.   Cardiovascular: Positive for leg swelling. Negative for chest pain and palpitations.  Gastrointestinal: Negative for diarrhea and constipation.  Genitourinary: Negative for dysuria, frequency and hematuria.  Neurological: Positive for headaches.       Objective:   Physical Exam  Constitutional: She is oriented to person, place, and time. She appears well-developed and well-nourished. No distress.  HENT:  Head: Normocephalic and atraumatic.  Neck: Neck supple.  Cardiovascular: Normal rate, regular rhythm and normal heart sounds.   Pulmonary/Chest: Effort normal and breath sounds normal.  Abdominal: Soft. Bowel sounds are normal. She exhibits no distension. There is no tenderness.  Musculoskeletal: She exhibits edema and tenderness.  B/L lumbar tenderness; B/L knee joint TTP (L>R) 2+ pretibial edema (B/L); L leg 47.5 in vs. R leg 42 in.; L calf TTP  Neurological: She is alert and oriented to person, place,  and time.  Skin: Skin is warm. She is not diaphoretic.  Psychiatric: She has a normal mood and affect. Her behavior is normal.          Assessment & Plan:  Please see problem based charting.

## 2012-11-14 NOTE — Assessment & Plan Note (Addendum)
Assessment:  Patient with exacerbation of her chronic back pain.  She has not been taking Baclofen.  She reports taking Vicodin twice daily as needed which usually controls the pain.  She has no saddle anesthesia or loss of bowel or bladder control.   Plan:   1) Refilled Vicodin 5/325 BID prn   2) Return to clinic in 1 month for follow-up

## 2012-11-14 NOTE — Patient Instructions (Addendum)
General Instructions: 1. I will send your refills to your pharmacy.  Please return to clinic in 2-3 months.     2. Please take all medications as prescribed.     3. If you have worsening of your symptoms or new symptoms arise, please call the clinic ([phone]), or go to the ER immediately if symptoms are severe.      Treatment Goals:  Goals (1 Years of Data) as of 11/14/12   None      Progress Toward Treatment Goals:    Self Care Goals & Plans:  Self Care Goal 11/14/2012  Manage my medications take my medicines as prescribed; bring my medications to every visit; refill my medications on time  Eat healthy foods drink diet soda or water instead of juice or soda; eat more vegetables; eat foods that are low in salt; eat baked foods instead of fried foods  Meeting treatment goals maintain the current self-care plan

## 2012-11-14 NOTE — Assessment & Plan Note (Addendum)
Referral to ophthalmology 

## 2012-11-14 NOTE — Assessment & Plan Note (Addendum)
Assessment:  Patient BP elevated today.  She has not taken anti-hypertensive medications since she ran out 1 week ago.  Also, pain likely contributing.  Plan:   1) BP medications refilled  2) Continue current regimen:  metoprolol 50mg  BID, chlorthalidone 25mg  daily  3) Check BMP today  4) Return to clinic in 2-3 months for BP check.

## 2012-11-14 NOTE — Assessment & Plan Note (Addendum)
Assessment:  Patient with B/L lower extremity edema but L>R.  Her left calf measures 47.5in and right calf measures 42in.  She also has significantly more pain in the left popliteal fossa and calf.  She has been out of chlorthalidone for the past week.  Plan:   1) Left lower extremity venous duplex obtained; negative for DVT or Baker's cyst.  2) Refilled chlorthalidone  3) Patient advised to go to the ED if she develops new symptoms, including increased leg pain/swelling, chest pain or shortness of breath.

## 2012-11-14 NOTE — Assessment & Plan Note (Signed)
Assessment:  Patient with exacerbation of her chronic pain since she ran out of Vicodin 1 week ago.  Plan:   1) Refilled Vicodin 5/325 BID prn   2) Return to clinic in 1 month for follow-up

## 2012-11-14 NOTE — Progress Notes (Signed)
*  PRELIMINARY RESULTS* Vascular Ultrasound Left lower extremity venous duplex has been completed.  Preliminary findings: Technically limited due to body habitus. No obvious evidence of DVT.  Attempted call report. Left message with results on voicemail for [facility].    Farrel Demark, RDMS, RVT  11/14/2012, 1:20 PM

## 2012-11-14 NOTE — Assessment & Plan Note (Signed)
Assessment:  Patient reports compliance with Fe supplement but has been out of medications for the past week.  Plan:   1) Check CBC today  2) Continue Fe 325mg  TID

## 2012-11-15 NOTE — Progress Notes (Signed)
I saw and evaluated the patient.  I personally confirmed the key portions of the history and exam documented by Dr. Wilson and I reviewed pertinent patient test results.  The assessment, diagnosis, and plan were formulated together and I agree with the documentation in the resident's note. 

## 2012-11-28 ENCOUNTER — Telehealth: Payer: Self-pay | Admitting: *Deleted

## 2012-11-28 NOTE — Telephone Encounter (Signed)
SPOKE WITH MESHA AT DR [facility] OFFICE. PATIENT IS AWARE THAT SHE NEEDS TO MAKE ARRANGEMENTS WITH THEIR OFFICE BEFORE GETTING APPOINTMENT. Nancy [name] NTII   9-22-014.

## 2012-12-15 ENCOUNTER — Other Ambulatory Visit: Payer: Self-pay | Admitting: *Deleted

## 2012-12-15 NOTE — Telephone Encounter (Signed)
[facility] is requesting authorization to dispense 90 day supply on all three meds.

## 2012-12-21 MED ORDER — METOPROLOL TARTRATE 50 MG PO TABS: 50.0000 mg | ORAL_TABLET | Freq: Two times a day (BID) | ORAL | Status: DC

## 2012-12-21 MED ORDER — CHLORTHALIDONE 25 MG PO TABS: ORAL_TABLET | ORAL | Status: DC

## 2012-12-21 MED ORDER — LEVOTHYROXINE SODIUM 50 MCG PO TABS: ORAL_TABLET | ORAL | Status: DC

## 2013-04-06 ENCOUNTER — Encounter: Payer: Medicare Other | Admitting: Internal Medicine

## 2013-04-06 ENCOUNTER — Encounter: Payer: Self-pay | Admitting: Internal Medicine

## 2013-05-24 ENCOUNTER — Encounter: Payer: Self-pay | Admitting: Internal Medicine

## 2013-05-24 ENCOUNTER — Ambulatory Visit (INDEPENDENT_AMBULATORY_CARE_PROVIDER_SITE_OTHER): Payer: Commercial Managed Care - HMO | Admitting: Internal Medicine

## 2013-05-24 VITALS — BP 186/105 | HR 74 | Temp 97.9°F | Wt 275.4 lb

## 2013-05-24 DIAGNOSIS — N950 Postmenopausal bleeding: Secondary | ICD-10-CM

## 2013-05-24 DIAGNOSIS — I10 Essential (primary) hypertension: Secondary | ICD-10-CM

## 2013-05-24 DIAGNOSIS — N938 Other specified abnormal uterine and vaginal bleeding: Secondary | ICD-10-CM

## 2013-05-24 DIAGNOSIS — D509 Iron deficiency anemia, unspecified: Secondary | ICD-10-CM

## 2013-05-24 DIAGNOSIS — E039 Hypothyroidism, unspecified: Secondary | ICD-10-CM

## 2013-05-24 DIAGNOSIS — R52 Pain, unspecified: Secondary | ICD-10-CM

## 2013-05-24 LAB — CBC
HCT: 35.9 % — ABNORMAL LOW (ref 36.0–46.0)
Hemoglobin: 11.2 g/dL — ABNORMAL LOW (ref 12.0–15.0)
MCH: 23.3 pg — ABNORMAL LOW (ref 26.0–34.0)
MCHC: 31.2 g/dL (ref 30.0–36.0)
MCV: 74.8 fL — ABNORMAL LOW (ref 78.0–100.0)
RBC: 4.8 MIL/uL (ref 3.87–5.11)
RDW: 16.7 % — ABNORMAL HIGH (ref 11.5–15.5)
WBC: 3.6 10*3/uL — ABNORMAL LOW (ref 4.0–10.5)

## 2013-05-24 LAB — BASIC METABOLIC PANEL WITHOUT GFR
BUN: 10 mg/dL (ref 6–23)
CO2: 29 meq/L (ref 19–32)
Calcium: 8.2 mg/dL — ABNORMAL LOW (ref 8.4–10.5)
Chloride: 101 meq/L (ref 96–112)
Creat: 0.81 mg/dL (ref 0.50–1.10)
GFR, Est African American: >89 mL/min
GFR, Est Non African American: 79 mL/min
Glucose, Bld: 91 mg/dL (ref 70–99)
Potassium: 4.1 meq/L (ref 3.5–5.3)
Sodium: 137 meq/L (ref 135–145)

## 2013-05-24 LAB — TSH: TSH: 4.513 u[IU]/mL — ABNORMAL HIGH (ref 0.350–4.500)

## 2013-05-24 MED ORDER — FERROUS SULFATE 325 (65 FE) MG PO TABS: 325.0000 mg | ORAL_TABLET | Freq: Three times a day (TID) | ORAL | Status: DC

## 2013-05-24 MED ORDER — LEVOTHYROXINE SODIUM 50 MCG PO TABS: ORAL_TABLET | ORAL | Status: DC

## 2013-05-24 MED ORDER — METOPROLOL TARTRATE 50 MG PO TABS: 50.0000 mg | ORAL_TABLET | Freq: Two times a day (BID) | ORAL | Status: DC

## 2013-05-24 MED ORDER — CYCLOBENZAPRINE HCL 5 MG PO TABS: 5.0000 mg | ORAL_TABLET | Freq: Three times a day (TID) | ORAL | Status: DC | PRN

## 2013-05-24 MED ORDER — CHLORTHALIDONE 25 MG PO TABS: ORAL_TABLET | ORAL | Status: DC

## 2013-05-24 NOTE — Patient Instructions (Addendum)
We would like you to go back to the woman doctor (gynecologist) to get the bleeding checked out. We will not refill your megace at this time until you see them.   We strongly think that you would benefit from a colon cancer screening.   We will give you an anti-inflammatory medicine called flexeril which you will take 1 pill up to 3 times per day for pain. Use heat on your sore muscles to help them to relax.   Come back to the clinic in 2 weeks to check on your blood pressure.   We will take some blood work today to check on your kidneys and your blood counts.   Call us if you are having more pain, breathing problems at [phone].  Colonoscopy A colonoscopy is an exam to look at your colon. This exam helps to find lumps (tumors), growths (polyps), puffiness (swelling), and bleeding in your colon.  BEFORE THE PROCEDURE  You may need to drink clear liquids for 2 days before the exam.  Ask your doctor about changing or stopping your regular medicines.  You may need liquid or medicines put in your butt (enema or laxatives) to help you poop.  You may need to drink a liquid over a short amount of time. This liquid cleans your colon.  Ask your doctor what time you need to arrive. PROCEDURE A tube is put in the opening of your butt (anus) and into your colon. The doctor will look for anything that is not normal. Your doctor may take a tissue sample (biopsy) from your colon to be looked at more closely. AFTER THE PROCEDURE  Have someone drive you home if you took pain medicine or a medicine to relax you (sedative).  You may see blood in your poop (stool). This is normal.  You may pass gas and have belly (abdominal) cramps. This is normal. Finding out the results of your test Ask when your test results will be ready. Make sure you get your test results. Document Released: 03/28/2010 Document Revised: 06/20/2012 Document Reviewed: 10/31/2012 [facility] Patient Information 2014 [employer]  [place].

## 2013-05-24 NOTE — Assessment & Plan Note (Signed)
Will not refill megace [facility] She may warrant repeat biopsy.

## 2013-05-24 NOTE — Progress Notes (Signed)
Subjective:     Patient ID: Nancy [name], female   DOB: 11/27/1955, 58 y.o.   MRN: [id number]  HPI The patient is a 58 YO female who is coming in for an acute visit for side pain. She also has PMH of HTN, hypothyroid, obesity. She had a cold about 1-2 months ago and switched sleeping position to sleeping on her left side. She has been sleeping that way for several weeks when she started having some left side pain. She hurts in her muscles from her shoulder blade to her butt. She remembers the pain of her disc protrusion back years ago and this is not like that. She has used some of her hydrocodone she had at home with mild improvement. She has not tried anything else although she does have heating pad at home she has not tried. She does not have any rash associated with it. She does not have burning when she pees. She does not have abdominal pain. She is not having chest pain or shoulder pain. She is not having arm pain. She is not having fevers or chills. Her cough and cold have resolved. She is not having nausea, vomiting, diarrhea, constipation. She does not have chest pains. She is still able to get around the house and do what she has to do. She does help her grandchildren dress. She has not been outdoors to the woods or exposed to sun. She has been out of all her medications for several months. She is having some morning headaches since not having her blood pressure medicines. She is also having some vaginal bleeding and attributes that to running out of her megace. She has been evaluated by gynecology in the past and has not been back for several years.   Review of Systems  Constitutional: Negative for fever, chills, diaphoresis, activity change, appetite change, fatigue and unexpected weight change.  Respiratory: Negative for cough, chest tightness, shortness of breath and wheezing.   Cardiovascular: Negative for chest pain, palpitations and leg swelling.  Gastrointestinal: Positive for abdominal  pain. Negative for nausea, vomiting, diarrhea, constipation, blood in stool and anal bleeding.       Mild cramps lower abdomen  Endocrine: Negative for cold intolerance, heat intolerance, polydipsia and polyuria.  Genitourinary: Positive for vaginal bleeding and pelvic pain. Negative for dysuria, frequency, flank pain, decreased urine volume, vaginal discharge, difficulty urinating, genital sores and vaginal pain.  Musculoskeletal: Positive for back pain and myalgias. Negative for arthralgias, gait problem, joint swelling, neck pain and neck stiffness.  Skin: Negative for color change, pallor, rash and wound.       Objective:   Physical Exam     Assessment/Plan:   1. Left side pain - Patient has muscular strain with spasm on exam and precipitant is unclear. Possibly the change in sleeping position could have caused it. No red flag signs. No spinal tenderness. Will trial flexeril for 2 weeks. If not improved would investigate further. No refill of hydrocodone given as patient still had some at home. Advised her to use heat on the area. No NSAID given as her BP is already elevated off her medications.   2. Please see problem oriented charting.  3. Disposition - Will resume medications and see her back in about 2 weeks for close follow up. Will check BMP and CBC today. Refilled her BP meds, iron, synthroid. It is unclear why she is on synthroid. Looking back all I can find are mildly elevated TSH with goiter without mass or  lesion. Will check TSH off meds and may be able to discontinue. She does not have any clinical signs or symptoms of hypothyroidism. Will not refill her megace as she needs gynecologic evaluation and may require additional endometrial biopsy. She also needs colonoscopy as she has not had one and she does not wish to proceed with that today.

## 2013-05-24 NOTE — Assessment & Plan Note (Signed)
BP high off all medications and will resume with recheck in 2 weeks for close follow up and BMP. Checking BMP today.

## 2013-05-24 NOTE — Assessment & Plan Note (Signed)
Unclear history, has been off medications for some time. No documented high TSH ever. US without lesion or cyst back in 2002. Refill synthroid but is TSH normal may be able to safely discontinue. No thyroid mass or cyst felt on exam.

## 2013-05-25 NOTE — Progress Notes (Signed)
Case discussed with Dr. Kollar soon after the resident saw the patient.  We reviewed the resident's history and exam and pertinent patient test results.  I agree with the assessment, diagnosis, and plan of care documented in the resident's note. 

## 2013-05-30 ENCOUNTER — Encounter: Payer: Self-pay | Admitting: Internal Medicine

## 2013-05-31 ENCOUNTER — Encounter: Payer: Self-pay | Admitting: Internal Medicine

## 2013-06-07 ENCOUNTER — Ambulatory Visit: Payer: Commercial Managed Care - HMO | Admitting: Internal Medicine

## 2013-06-27 ENCOUNTER — Other Ambulatory Visit: Payer: Self-pay | Admitting: Internal Medicine

## 2013-07-13 ENCOUNTER — Encounter: Payer: Commercial Managed Care - HMO | Admitting: Obstetrics & Gynecology

## 2013-08-18 ENCOUNTER — Ambulatory Visit (INDEPENDENT_AMBULATORY_CARE_PROVIDER_SITE_OTHER): Payer: Commercial Managed Care - HMO | Admitting: Internal Medicine

## 2013-08-18 ENCOUNTER — Encounter: Payer: Self-pay | Admitting: Internal Medicine

## 2013-08-18 VITALS — BP 169/94 | HR 60 | Temp 97.4°F | Wt 271.9 lb

## 2013-08-18 DIAGNOSIS — M549 Dorsalgia, unspecified: Secondary | ICD-10-CM

## 2013-08-18 DIAGNOSIS — I10 Essential (primary) hypertension: Secondary | ICD-10-CM

## 2013-08-18 DIAGNOSIS — R519 Headache, unspecified: Secondary | ICD-10-CM | POA: Insufficient documentation

## 2013-08-18 DIAGNOSIS — E039 Hypothyroidism, unspecified: Secondary | ICD-10-CM

## 2013-08-18 DIAGNOSIS — R51 Headache: Secondary | ICD-10-CM

## 2013-08-18 LAB — BASIC METABOLIC PANEL WITHOUT GFR
BUN: 9 mg/dL (ref 6–23)
CO2: 30 meq/L (ref 19–32)
Calcium: 8.7 mg/dL (ref 8.4–10.5)
Chloride: 103 meq/L (ref 96–112)
Creat: 0.9 mg/dL (ref 0.50–1.10)
GFR, Est African American: 80 mL/min
GFR, Est Non African American: 70 mL/min
Glucose, Bld: 99 mg/dL (ref 70–99)
Potassium: 4.5 meq/L (ref 3.5–5.3)
Sodium: 141 meq/L (ref 135–145)

## 2013-08-18 LAB — TSH: TSH: 4.607 u[IU]/mL — ABNORMAL HIGH (ref 0.350–4.500)

## 2013-08-18 MED ORDER — HYDROCODONE-ACETAMINOPHEN 5-325 MG PO TABS: 1.0000 | ORAL_TABLET | Freq: Two times a day (BID) | ORAL | Status: DC | PRN

## 2013-08-18 MED ORDER — LISINOPRIL 10 MG PO TABS: 10.0000 mg | ORAL_TABLET | Freq: Every day | ORAL | Status: DC

## 2013-08-18 MED ORDER — CHLORTHALIDONE 50 MG PO TABS: ORAL_TABLET | ORAL | Status: DC

## 2013-08-18 MED ORDER — IBUPROFEN 400 MG PO TABS: 400.0000 mg | ORAL_TABLET | Freq: Four times a day (QID) | ORAL | Status: DC | PRN

## 2013-08-18 NOTE — Assessment & Plan Note (Addendum)
Assessment: Pt with history of possible migraine headaches without aura who presents with mild headache without neurological signs most likely due to uncontrolled hypertension with 11/05/11 CT head findings of possible Chiari 1 malformation.  Plan: -Pt given ibuprofen 200 mg x 1 dose with improvement of symptoms  -Pt instructed to take OTC ibuprofen 200-400 mg Q 6-8 hr PRN  -Refill hydrocodone/acetaminophen 5-325 mg Q 12 hr PRN for concomitant back pain -Blood pressure control, continue metoprolol 50 mg BID, increase chlorthalidone 25 to 50 mg daily, and start lisinopril 10 mg daily -Consider MRI brain at next visit if pt agreeable (declined MRI in the past)

## 2013-08-18 NOTE — Assessment & Plan Note (Addendum)
Assessment: Pt with poorly controlled hypertension with questionable compliance with 2-class (BB & diuretic) anti-hypertensive therapy who presents with symptomatic blood pressure of 195/122 later improved to 169/94 with improvement of headache.  Plan: -Repeat BP 169/94 (1 hr after taking meds) not at goal <140/90  -Continue metoprolol 50 mg BID  -Increase chlorthalidone 25 to 50 mg daily (max dose)  -Start lisinopril 10 mg daily -Obtain BMP ---> normal   -Will return in 1 week

## 2013-08-18 NOTE — Assessment & Plan Note (Signed)
Assessment: Pt with hypothyroidism with questionable compliance with thyroid replacement therapy with last TSH that was elevated on 05/24/13 who presents with no symptoms of thyroid dysfunction.    Plan: -Obtain TSH and adjust levothyroxine as necessary -Continue levothyroxine 50 mcg daily

## 2013-08-18 NOTE — Assessment & Plan Note (Signed)
Assessment: Pt with history of mild DDD and facet arthropathy at L5-S1 who presents with no alarm symptoms.   Plan: -Refill hydrocodone/acetaminophen 5-325 mg Q 12 hr PRN  -Monitor for alarm symptoms

## 2013-08-18 NOTE — Progress Notes (Signed)
Patient ID: Nancy [name], female   DOB: 05/08/1955, 58 y.o.   MRN: [id number]   Subjective:   Patient ID: Nancy ChristiansLinda S [name] female   DOB: 07/27/1955 58 y.o.   MRN: [id number]  HPI: Nancy [name] is a 58 y.o. woman with primary sjogren's syndrome, hypertension, chronic microcytic anemia, chronic back pain, bilateral macular degeneration, arthritis, and OSA who presents for chief complaint of headache.  Pt reports having 2-week history of headaches occuring primarily in the morning but can last throughout the day. They are located bilaterally in the temple area and behind both eyes and are usually throbbing in nature with intensity up to 10/10. They last anywhere from 1-2 days and are accompanied by blurry vision (seeing spots), photophobia, and inability to function. Pain is relieved with taking aspirin and norco/vicodin. She does not take NSAID's or tylenol. She reports possible history of migraines without aura when she was younger and having them occasionally with last time occuring about a few months ago. She denies fever, chills, neck stiffness, aura,  nausea, vomiting, scalp tenderness, rhinorrhea, dizziness, speech change, imbalance, weakness, or paresthesias.   She reports compliance with lopressor 50 mg BID and chlorthalidone 25 mg daily since March when she was seen with blood pressure of 186/105, however sometimes forgets to take them with last use about 2 days ago. She denies chest pain, dyspnea, palpitations, confusion, leg swelling, or decreased urine output.    She reports chronic low back pain and occasionally taking norco/vicodin. She has recently had pain on her left side from her neck/shoulder region to down her leg without weakness or numbness/tingling. She has not taken any pain medications for it.   Her TSH at last visit in March was high. She reports being compliant with taking levothyroxine 50 mcg daily.  She has possible weight gain (however since last visit 4 lb wt loss) and  loose stools but denies other thyroid symptoms.    Past Medical History  Diagnosis Date  . Primary Sjogren's syndrome     anti Ro+;ANA>1:1280 in homogen pattern, negative ds DNA/RF/anti Smith/RNP/C3-4 comp/la/jo1/Scleroderma/centromere, neg HIV/ACE/Hep B/C, nl CXR 2/05, schirmer salivary glandtest not done, symptom rx:eye drops/prednisone/plaquenil [facility], Dr. Rushie NyhanBourne/o'Rourke 3/07. saw [name] but stopped 2/2 cost.  . Hypertension     no LVH EKG-7/05, nl M/C ratio 4/07  . Morbid obesity   . Back pain     chronic low back pain L4-5 discectomy:11/99. degenerative thoracic spondylotic changes 4/07  . Obstructive sleep apnea     severe 7/05 RD 161 per hr. /CPAP 18cwp  . Hypothyroidism     09/1998  . Microcytic anemia     09/1998. Hb-10.5/MCV 76. needs ferritin to determine ACD vs IDA  . Menorrhagia   . Uterine bleeding   . Lower extremity edema     echo EF 55-65% w/o evidence of Dias dysfx.,   . Polyarthralgia     (knee/back/ankle) w/dx of fibromyalgia? [facility], knee pain chronic 2/2 obesity, fibromyalgia and Sjogren's.  . Chest pain     neg adenosine myoview.   Current Outpatient Prescriptions  Medication Sig Dispense Refill  . albuterol (PROAIR HFA) 108 (90 BASE) MCG/ACT inhaler Inhale 1 puff into the lungs every 6 (six) hours as needed for wheezing.  1 Inhaler  2  . aspirin EC 81 MG tablet Take 1 tablet (81 mg total) by mouth daily.  30 tablet  2  . chlorthalidone (HYGROTON) 25 MG tablet TAKE 1 TABLET BY MOUTH  EVERY DAY  30 tablet  2  . cyclobenzaprine (FLEXERIL) 5 MG tablet Take 1 tablet (5 mg total) by mouth 3 (three) times daily as needed for muscle spasms.  30 tablet  0  . docusate sodium (COLACE) 100 MG capsule Take 1 capsule (100 mg total) by mouth 2 (two) times daily as needed for constipation.  30 capsule  2  . ferrous sulfate 325 (65 FE) MG tablet Take 1 tablet (325 mg total) by mouth 3 (three) times daily with meals.  90 tablet  2  .  fluticasone (FLONASE) 50 MCG/ACT nasal spray PLACE 1 SPRAY INTO THE NOSE EVERY MORNING.  16 g  1  . HYDROcodone-acetaminophen (NORCO/VICODIN) 5-325 MG per tablet Take 1 tablet by mouth every 12 (twelve) hours as needed for pain.  60 tablet  0  . levothyroxine (SYNTHROID, LEVOTHROID) 50 MCG tablet TAKE 1 TABLET BY MOUTH EVERY DAY  30 tablet  2  . megestrol (MEGACE) 40 MG tablet Take 2 tablets (80 mg total) by mouth daily.  60 tablet  5  . metoprolol (LOPRESSOR) 50 MG tablet Take 1 tablet (50 mg total) by mouth 2 (two) times daily.  60 tablet  2   No current facility-administered medications for this visit.   Family History  Problem Relation Age of Onset  . Hypertension Mother   . Cancer Mother   . Cancer Father   . Cancer Sister   . Ovarian cancer      family history  . Breast cancer      family history in 1st degree relatives.  . Thyroid disease Sister    History   Social History  . Marital Status: Married    Spouse Name: N/A    Number of Children: N/A  . Years of Education: N/A   Occupational History  . house wife    Social History Main Topics  . Smoking status: Never Smoker   . Smokeless tobacco: Never Used  . Alcohol Use: No  . Drug Use: No  . Sexual Activity: Yes    Partners: Male    Birth Control/ Protection: Post-menopausal   Other Topics Concern  . Not on file   Social History Narrative   Her daughter Nancy Artis(keisha) often comes with her to visits. She also has a grand daughter and great grand daughter(whose name is Nancy [name])   Review of Systems: Review of Systems  Constitutional: Positive for malaise/fatigue.  HENT: Negative for congestion and sore throat.   Eyes: Positive for blurred vision. Negative for double vision.       "seeing spots"  Respiratory: Negative for cough, shortness of breath and wheezing.   Cardiovascular: Negative for chest pain, palpitations and leg swelling.  Gastrointestinal: Positive for diarrhea. Negative for nausea, vomiting, abdominal  pain, constipation and blood in stool.  Genitourinary: Negative for dysuria, urgency, frequency and hematuria.  Musculoskeletal: Positive for back pain (chronic at baseline). Negative for falls and myalgias.  Neurological: Positive for headaches. Negative for dizziness, sensory change and focal weakness.    Objective:  Physical Exam: Filed Vitals:   08/18/13 0934 08/18/13 0950 08/18/13 1119  BP: 195/122 195/100 169/94  Pulse: 76 81 60  Temp: 97.4 F (36.3 C)    TempSrc: Oral    Weight: 271 lb 14.4 oz (123.333 kg)    SpO2: 95%      Physical Exam  Constitutional: She is oriented to person, place, and time. She appears well-developed and well-nourished. No distress.  HENT:  Head: Normocephalic and atraumatic.  Right Ear: External ear normal.  Left Ear: External ear normal.  Nose: Nose normal.  Mouth/Throat: Oropharynx is clear and moist. No oropharyngeal exudate.  Facial hair  Eyes: Conjunctivae and EOM are normal. Pupils are equal, round, and reactive to light. Right eye exhibits no discharge. Left eye exhibits no discharge. No scleral icterus.  Neck: Normal range of motion. Neck supple.  Cardiovascular: Normal rate, regular rhythm and normal heart sounds.   Pulmonary/Chest: Effort normal and breath sounds normal. No respiratory distress. She has no wheezes. She has no rales.  Abdominal: Soft. Bowel sounds are normal. She exhibits no distension. There is no tenderness. There is no rebound and no guarding.  Obese  Musculoskeletal: Normal range of motion. She exhibits edema (trace BL LE pitting edema). She exhibits no tenderness.  Neurological: She is alert and oriented to person, place, and time. No cranial nerve deficit. She exhibits normal muscle tone. Coordination normal.  Normal 5/5 strength throughout Normal sensation to light touch  Skin: Skin is warm and dry. No rash noted. She is not diaphoretic. No erythema. No pallor.  Psychiatric: She has a normal mood and affect. Her  behavior is normal. Judgment and thought content normal.    Assessment & Plan:  Please see problem list for problem-based assessment and plan

## 2013-08-18 NOTE — Patient Instructions (Addendum)
-  Start taking lisinopril 10 mg daily  -Increase your chlorthalidone to 50 mg daily  -Keep taking lopressor 50 mg twice a day -Take OTC ibuprofen 200- 400 mg every 6-8 hrs for headache  -I have refilled your hydrocodone  -Will see you back 1 week, nice meeting you!  General Instructions:   Thank you for bringing your medicines today. This helps us keep you safe from mistakes.   Progress Toward Treatment Goals:  No flowsheet data found.  Self Care Goals & Plans:  Self Care Goal 11/14/2012  Manage my medications take my medicines as prescribed; bring my medications to every visit; refill my medications on time  Eat healthy foods drink diet soda or water instead of juice or soda; eat more vegetables; eat foods that are low in salt; eat baked foods instead of fried foods  Meeting treatment goals maintain the current self-care plan    No flowsheet data found.   Care Management & Community Referrals:  No flowsheet data found.    Hypertension Hypertension is another name for high blood pressure. High blood pressure may mean that your heart needs to work harder to pump blood. Blood pressure consists of two numbers, which includes a higher number over a lower number (example: 110/72). HOME CARE   Make lifestyle changes as told by your doctor. This may include weight loss and exercise.  Take your blood pressure medicine every day.  Limit how much salt you use.  Stop smoking if you smoke.  Do not use drugs.  Talk to your doctor if you are using decongestants or birth control pills. These medicines might make blood pressure higher.  Females should not drink more than 1 alcoholic drink per day. Males should not drink more than 2 alcoholic drinks per day.  See your doctor as told. GET HELP RIGHT AWAY IF:   You have a blood pressure reading with a top number of 180 or higher.  You get a very bad headache.  You get blurred or changing vision.  You feel confused.  You feel  weak, numb, or faint.  You get chest or belly (abdominal) pain.  You throw up (vomit).  You cannot breathe very well. MAKE SURE YOU:   Understand these instructions.  Will watch your condition.  Will get help right away if you are not doing well or get worse. Document Released: 08/12/2007 Document Revised: 05/18/2011 Document Reviewed: 08/12/2007 [facility] Patient Information 2014 [employer], [place].

## 2013-08-21 ENCOUNTER — Encounter: Payer: Commercial Managed Care - HMO | Admitting: Obstetrics & Gynecology

## 2013-08-21 NOTE — Addendum Note (Signed)
Addended by: Debe CoderMULLEN, Amari Burnsworth B on: 08/21/2013 03:12 PM   Modules accepted: Level of Service

## 2013-08-21 NOTE — Progress Notes (Signed)
Case discussed with Dr. Rabbani at the time of the visit.  We reviewed the resident's history and exam and pertinent patient test results.  I agree with the assessment, diagnosis, and plan of care documented in the resident's note. 

## 2013-08-24 ENCOUNTER — Encounter: Payer: Self-pay | Admitting: Internal Medicine

## 2013-08-24 ENCOUNTER — Ambulatory Visit: Payer: Commercial Managed Care - HMO | Admitting: Internal Medicine

## 2013-09-01 ENCOUNTER — Ambulatory Visit: Payer: Commercial Managed Care - HMO | Admitting: Internal Medicine

## 2013-09-12 NOTE — Addendum Note (Signed)
Addended by: Neomia DearPOWERS, Zala Degrasse E on: 09/12/2013 06:39 PM   Modules accepted: Orders

## 2013-09-14 ENCOUNTER — Encounter: Payer: Self-pay | Admitting: Internal Medicine

## 2013-09-14 ENCOUNTER — Ambulatory Visit (INDEPENDENT_AMBULATORY_CARE_PROVIDER_SITE_OTHER): Payer: Commercial Managed Care - HMO | Admitting: Internal Medicine

## 2013-09-14 VITALS — BP 143/80 | HR 83 | Temp 97.3°F | Ht 63.0 in | Wt 265.2 lb

## 2013-09-14 DIAGNOSIS — G8929 Other chronic pain: Secondary | ICD-10-CM

## 2013-09-14 DIAGNOSIS — Z6841 Body Mass Index (BMI) 40.0 and over, adult: Secondary | ICD-10-CM | POA: Insufficient documentation

## 2013-09-14 DIAGNOSIS — Z0000 Encounter for general adult medical examination without abnormal findings: Secondary | ICD-10-CM

## 2013-09-14 DIAGNOSIS — I10 Essential (primary) hypertension: Secondary | ICD-10-CM

## 2013-09-14 DIAGNOSIS — R51 Headache: Secondary | ICD-10-CM

## 2013-09-14 DIAGNOSIS — R350 Frequency of micturition: Secondary | ICD-10-CM

## 2013-09-14 LAB — POCT GLYCOSYLATED HEMOGLOBIN (HGB A1C): Hemoglobin A1C: 5.6

## 2013-09-14 LAB — LIPID PANEL
Cholesterol: 160 mg/dL (ref 0–200)
HDL: 39 mg/dL — ABNORMAL LOW
LDL Cholesterol: 101 mg/dL — ABNORMAL HIGH (ref 0–99)
Total CHOL/HDL Ratio: 4.1 ratio
Triglycerides: 99 mg/dL
VLDL: 20 mg/dL (ref 0–40)

## 2013-09-14 LAB — GLUCOSE, CAPILLARY: Glucose-Capillary: 98 mg/dL (ref 70–99)

## 2013-09-14 NOTE — Assessment & Plan Note (Signed)
Patient is actively trying to lose weight and has lost approx 6 pounds. She is encouraged to continue to lose weight.

## 2013-09-14 NOTE — Progress Notes (Signed)
   Subjective:    Patient ID: Nancy [name], female    DOB: 03/21/1955, 58 y.o.   MRN: [id number]  Headache  Associated symptoms include tinnitus. Pertinent negatives include no ear pain or hearing loss.   58 year old female with PMH of  HTN, hypothyroidism, headache, obesity who presents for routine follow up today. Patient does complain of increased urinary frequency and malodorous urine over the past week. No fevers/chills, no dysuria. She attributes to not drinking enough water Patient also complains of mild tinnitus when she wakes up in the morning that resolves spontaneously. Has received refills on her medications on her last visit and reports compliance with BP and thyroid medication. States her headaches have gotten better- now intermittent and decreased intensity     Review of Systems  Constitutional: Negative.   HENT: Positive for tinnitus. Negative for congestion, ear discharge, ear pain and hearing loss.   Eyes: Negative.   Respiratory: Negative.   Cardiovascular: Negative.   Gastrointestinal: Negative.   Genitourinary: Positive for frequency. Negative for dysuria, flank pain and difficulty urinating.  Musculoskeletal: Negative.   Neurological: Positive for headaches.  Psychiatric/Behavioral: Negative.        Objective:   Physical Exam  Constitutional: She is oriented to person, place, and time. She appears well-developed and well-nourished.  HENT:  Head: Normocephalic and atraumatic.  Right Ear: External ear normal.  Left Ear: External ear normal.  Mouth/Throat: No oropharyngeal exudate.  Cardiovascular: Normal rate, regular rhythm and normal heart sounds.   Pulmonary/Chest: Effort normal and breath sounds normal. She has no wheezes. She has no rales.  Abdominal: Soft. Bowel sounds are normal. She exhibits no distension. There is no tenderness.  Musculoskeletal: Normal range of motion. She exhibits no edema.  Neurological: She is alert and oriented to person,  place, and time.  Skin: Skin is warm and dry.          Assessment & Plan:  - Patient with urinary frequency. Will check urine analysis but doubt UTI at this time as no fevers or dysuria - Will check A1C, lipid panel and refer patient for colonoscopy and mammogram as part of routine health maintenance  See problem based charting for remaining assessment and plan

## 2013-09-14 NOTE — Assessment & Plan Note (Signed)
Headaches have now improved with better BP control No further imaging at this time. Will monitor

## 2013-09-14 NOTE — Patient Instructions (Signed)
Your blood pressure has improved. Continue with current medications for now - We are referring you for a mammogram and colonoscopy as part of your health maintenance - We will follow up on your blood work that's being done today - Congratulations on losing weight. Keep it up!  - It was a pleasure meeting you today   Tinnitus Sounds you hear in your ears and coming from within the ear is called tinnitus. This can be a symptom of many ear disorders. It is often associated with hearing loss.  Tinnitus can be seen with:  Infections.  Ear blockages such as wax buildup.  Meniere's disease.  Ear damage.  Inherited.  Occupational causes. While irritating, it is not usually a threat to health. When the cause of the tinnitus is wax, infection in the middle ear, or foreign body it is easily treated. Hearing loss will usually be reversible.  TREATMENT  When treating the underlying cause does not get rid of tinnitus, it may be necessary to get rid of the unwanted sound by covering it up with more pleasant background noises. This may include music, the radio etc. There are tinnitus maskers which can be worn which produce background noise to cover up the tinnitus. Avoid all medications which tend to make tinnitus worse such as alcohol, caffeine, aspirin, and nicotine. There are many soothing background tapes such as rain, ocean, thunderstorms, etc. These soothing sounds help with sleeping or resting. Keep all follow-up appointments and referrals. This is important to identify the cause of the problem. It also helps avoid complications, impaired hearing, disability, or chronic pain. Document Released: 02/23/2005 Document Revised: 05/18/2011 Document Reviewed: 10/12/2007 [facility] Patient Information 2015 [employer], [place]. This information is not intended to replace advice given to you by your health care provider. Make sure you discuss any questions you have with your health care provider.

## 2013-09-14 NOTE — Assessment & Plan Note (Signed)
BP Readings from Last 3 Encounters:  09/14/13 143/80  08/18/13 169/94  05/24/13 186/105    Lab Results  Component Value Date   NA 141 08/18/2013   K 4.5 08/18/2013   CREATININE 0.90 08/18/2013    Assessment: Blood pressure control:  improved Progress toward BP goal:   near goal Comments: patient is compliant with medications with improved BP control  Plan: Medications:  continue current medications Educational resources provided: brochure Self management tools provided: home blood pressure logbook Other plans: will recheck BP in 3 months

## 2013-09-15 LAB — URINALYSIS, ROUTINE W REFLEX MICROSCOPIC
Glucose, UA: NEGATIVE mg/dL
Hgb urine dipstick: NEGATIVE
Nitrite: NEGATIVE
Protein, ur: 30 mg/dL — AB
Specific Gravity, Urine: 1.027 (ref 1.005–1.030)
Urobilinogen, UA: 1 mg/dL (ref 0.0–1.0)
pH: 5 (ref 5.0–8.0)

## 2013-09-15 LAB — URINALYSIS, MICROSCOPIC ONLY: Crystals: NONE SEEN

## 2013-09-20 ENCOUNTER — Other Ambulatory Visit: Payer: Self-pay | Admitting: Internal Medicine

## 2013-10-03 ENCOUNTER — Encounter: Payer: Self-pay | Admitting: Internal Medicine

## 2013-10-31 ENCOUNTER — Other Ambulatory Visit: Payer: Self-pay | Admitting: Internal Medicine

## 2013-10-31 DIAGNOSIS — Z1231 Encounter for screening mammogram for malignant neoplasm of breast: Secondary | ICD-10-CM

## 2013-12-01 ENCOUNTER — Ambulatory Visit (AMBULATORY_SURGERY_CENTER): Payer: Commercial Managed Care - HMO

## 2013-12-01 ENCOUNTER — Other Ambulatory Visit: Payer: Self-pay | Admitting: Internal Medicine

## 2013-12-01 VITALS — Ht 63.0 in | Wt 265.0 lb

## 2013-12-01 DIAGNOSIS — Z1211 Encounter for screening for malignant neoplasm of colon: Secondary | ICD-10-CM

## 2013-12-01 MED ORDER — MOVIPREP 100 G PO SOLR: 1.0000 | Freq: Once | ORAL | Status: DC

## 2013-12-01 NOTE — Progress Notes (Signed)
No allergies to eggs or soy No past problems with anesthesia No home oxygen No diet/weight loss meds  Has email  Emmi instructions given for colonoscopy 

## 2013-12-05 ENCOUNTER — Ambulatory Visit (HOSPITAL_COMMUNITY): Payer: Commercial Managed Care - HMO

## 2013-12-08 ENCOUNTER — Encounter: Payer: Self-pay | Admitting: Internal Medicine

## 2013-12-15 ENCOUNTER — Encounter: Payer: Commercial Managed Care - HMO | Admitting: Internal Medicine

## 2013-12-18 ENCOUNTER — Ambulatory Visit (HOSPITAL_COMMUNITY): Admission: RE | Admit: 2013-12-18 | Payer: Commercial Managed Care - HMO | Source: Ambulatory Visit

## 2013-12-22 ENCOUNTER — Ambulatory Visit (AMBULATORY_SURGERY_CENTER): Payer: Commercial Managed Care - HMO | Admitting: Internal Medicine

## 2013-12-22 ENCOUNTER — Encounter: Payer: Self-pay | Admitting: Internal Medicine

## 2013-12-22 VITALS — BP 160/92 | HR 80 | Temp 97.9°F | Resp 18 | Ht 63.0 in | Wt 265.0 lb

## 2013-12-22 DIAGNOSIS — Z1211 Encounter for screening for malignant neoplasm of colon: Secondary | ICD-10-CM | POA: Diagnosis present

## 2013-12-22 MED ORDER — SODIUM CHLORIDE 0.9 % IV SOLN: 500.0000 mL | INTRAVENOUS | Status: DC

## 2013-12-22 NOTE — Patient Instructions (Signed)
YOU HAD AN ENDOSCOPIC PROCEDURE TODAY AT THE  ENDOSCOPY CENTER: Refer to the procedure report that was given to you for any specific questions about what was found during the examination.  If the procedure report does not answer your questions, please call your gastroenterologist to clarify.  If you requested that your care partner not be given the details of your procedure findings, then the procedure report has been included in a sealed envelope for you to review at your convenience later.  YOU SHOULD EXPECT: Some feelings of bloating in the abdomen. Passage of more gas than usual.  Walking can help get rid of the air that was put into your GI tract during the procedure and reduce the bloating. If you had a lower endoscopy (such as a colonoscopy or flexible sigmoidoscopy) you may notice spotting of blood in your stool or on the toilet paper. If you underwent a bowel prep for your procedure, then you may not have a normal bowel movement for a few days.  DIET: Your first meal following the procedure should be a light meal and then it is ok to progress to your normal diet.  A half-sandwich or bowl of soup is an example of a good first meal.  Heavy or fried foods are harder to digest and may make you feel nauseous or bloated.  Likewise meals heavy in dairy and vegetables can cause extra gas to form and this can also increase the bloating.  Drink plenty of fluids but you should avoid alcoholic beverages for 24 hours.  ACTIVITY: Your care partner should take you home directly after the procedure.  You should plan to take it easy, moving slowly for the rest of the day.  You can resume normal activity the day after the procedure however you should NOT DRIVE or use heavy machinery for 24 hours (because of the sedation medicines used during the test).    SYMPTOMS TO REPORT IMMEDIATELY: A gastroenterologist can be reached at any hour.  During normal business hours, 8:30 AM to 5:00 PM Monday through Friday,  call [phone].  After hours and on weekends, please call the GI answering service at [phone] who will take a message and have the physician on call contact you.   Following lower endoscopy (colonoscopy or flexible sigmoidoscopy):  Excessive amounts of blood in the stool  Significant tenderness or worsening of abdominal pains  Swelling of the abdomen that is new, acute  Fever of 100F or higher  FOLLOW UP: If any biopsies were taken you will be contacted by phone or by letter within the next 1-3 weeks.  Call your gastroenterologist if you have not heard about the biopsies in 3 weeks.  Our staff will call the home number listed on your records the next business day following your procedure to check on you and address any questions or concerns that you may have at that time regarding the information given to you following your procedure. This is a courtesy call and so if there is no answer at the home number and we have not heard from you through the emergency physician on call, we will assume that you have returned to your regular daily activities without incident.  SIGNATURES/CONFIDENTIALITY: You and/or your care partner have signed paperwork which will be entered into your electronic medical record.  These signatures attest to the fact that that the information above on your After Visit Summary has been reviewed and is understood.  Full responsibility of the confidentiality of this   discharge information lies with you and/or your care-partner.  Hemorrhoids, high fiber diet-handouts given  Repeat colonoscopy in 10 years-2025.   

## 2013-12-22 NOTE — Op Note (Signed)
[facility] [address] [place], [id number]   COLONOSCOPY PROCEDURE REPORT  PATIENT: [name], Nancy S  MR#: [id number] BIRTHDATE: 1955-05-24 , 58  yrs. old GENDER: female ENDOSCOPIST: Hart Carwinora M Michi Herrmann, MD REFERRED BY:Dr N.Narendra PROCEDURE DATE:  12/22/2013 PROCEDURE:   Colonoscopy, screening First Screening Colonoscopy - Avg.  risk and is 50 yrs.  old or older Yes.  Prior Negative Screening - Now for repeat screening. N/A  History of Adenoma - Now for follow-up colonoscopy & has been > or = to 3 yrs.  N/A  Polyps Removed Today? No. ASA CLASS:   Class II INDICATIONS:average risk for colon cancer. MEDICATIONS: Monitored anesthesia care and Propofol 160 mg IV  DESCRIPTION OF PROCEDURE:   After the risks benefits and alternatives of the procedure were thoroughly explained, informed consent was obtained.  The digital rectal exam revealed no abnormalities of the rectum.   The LB [id number]  endoscope was introduced through the anus and advanced to the cecum, which was identified by both the appendix and ileocecal valve. No adverse events experienced.   The quality of the prep was good, using MoviPrep  The instrument was then slowly withdrawn as the colon was fully examined.      COLON FINDINGS: Small internal Grade I hemorrhoids were found. Retroflexed views revealed no abnormalities. The time to cecum=4 minutes 21 seconds.  Withdrawal time=6 minutes 10 seconds.  The scope was withdrawn and the procedure completed. COMPLICATIONS: There were no immediate complications.  ENDOSCOPIC IMPRESSION: Small internal Grade I hemorrhoids  RECOMMENDATIONS: High fiber diet Recall colonoscopy in 10 years  eSigned:  Hart Carwinora M Dyamon Sosinski, MD 12/22/2013 11:13 AM   cc:

## 2013-12-22 NOTE — Progress Notes (Signed)
Procedure ends, to recovery, report given and VSS. 

## 2013-12-25 ENCOUNTER — Telehealth: Payer: Self-pay | Admitting: *Deleted

## 2013-12-25 NOTE — Telephone Encounter (Signed)
  Follow up Call-  Call back number 12/22/2013  Post procedure Call Back phone  # [phone]  Permission to leave phone message Yes     Patient questions:  Do you have a fever, pain , or abdominal swelling? No. Pain Score  0 *  Have you tolerated food without any problems? Yes.    Have you been able to return to your normal activities? Yes.    Do you have any questions about your discharge instructions: Diet   No. Medications  No. Follow up visit  No.  Do you have questions or concerns about your Care? No.  Actions: * If pain score is 4 or above: No action needed, pain <4.

## 2013-12-29 ENCOUNTER — Other Ambulatory Visit: Payer: Self-pay | Admitting: *Deleted

## 2013-12-29 MED ORDER — LEVOTHYROXINE SODIUM 50 MCG PO TABS: ORAL_TABLET | ORAL | Status: DC

## 2014-01-23 ENCOUNTER — Ambulatory Visit (INDEPENDENT_AMBULATORY_CARE_PROVIDER_SITE_OTHER): Payer: Commercial Managed Care - HMO | Admitting: Internal Medicine

## 2014-01-23 ENCOUNTER — Encounter: Payer: Self-pay | Admitting: Internal Medicine

## 2014-01-23 VITALS — BP 167/91 | HR 86 | Temp 97.8°F | Ht 63.0 in | Wt 262.3 lb

## 2014-01-23 DIAGNOSIS — J309 Allergic rhinitis, unspecified: Secondary | ICD-10-CM

## 2014-01-23 MED ORDER — FLUTICASONE PROPIONATE 50 MCG/ACT NA SUSP: NASAL | Status: DC

## 2014-01-23 MED ORDER — GUAIFENESIN ER 600 MG PO TB12: 600.0000 mg | ORAL_TABLET | Freq: Two times a day (BID) | ORAL | Status: DC

## 2014-01-23 NOTE — Patient Instructions (Addendum)
General Instructions:  1. Take flonase nasal spray once per day for your allergy symptoms.  I have also prescribed mucinex to help with your cough.  You can also try cough drops to soothe the throat.  Continue ibuprofen or Tylenol for cough related aches and pains.    2. Please take all medications as prescribed.   3. If you have worsening of your symptoms or new symptoms arise, please call the clinic ([phone]), or go to the ER immediately if symptoms are severe.  Thank you for bringing your medicines today. This helps us keep you safe from mistakes.   Treatment Goals:  Goals (1 Years of Data) as of 01/23/14    None      Progress Toward Treatment Goals:  No flowsheet data found.  Self Care Goals & Plans:  Self Care Goal 01/23/2014  Manage my medications take my medicines as prescribed; bring my medications to every visit; refill my medications on time; follow the sick day instructions if I am sick  Monitor my health keep track of my blood pressure; keep track of my weight  Eat healthy foods eat more vegetables; eat fruit for snacks and desserts; eat baked foods instead of fried foods; eat smaller portions  Be physically active find an activity I enjoy  Meeting treatment goals -    No flowsheet data found.   Care Management & Community Referrals:  No flowsheet data found.

## 2014-01-23 NOTE — Progress Notes (Signed)
   Subjective:    Patient ID: Nancy [name], female    DOB: 01/18/1956, 58 y.o.   MRN: [id number]  HPI Comments: [name] is a 58 year old woman with a PMH of HTN, OSA, hypothyroidism, chronic knee pain, back pain and iron deficiency anemia here with c/o of cough occasionally productive of clear mucous.  This has been going on < 2 weeks.  Also with postnasal drip, watery eyes, occasional rhinorrhea, chills, headache.   Worse when she lays down at night.  Denies dyspnea or sick contacts.  She drove herself to the clinic appointment today.     Review of Systems  Constitutional: Positive for appetite change.       Appetite has not been good in the past few days since she has been sick.  HENT: Positive for congestion, postnasal drip and rhinorrhea. Negative for sinus pressure and sore throat.   Respiratory: Negative for shortness of breath.   Cardiovascular: Negative for chest pain.  Gastrointestinal: Negative for nausea and vomiting.  Neurological: Negative for syncope.       Objective:   Physical Exam  Constitutional: She is oriented to person, place, and time. She appears well-developed. No distress.  HENT:  Head: Normocephalic and atraumatic.  Right Ear: External ear normal.  Left Ear: External ear normal.  Mouth/Throat: Oropharynx is clear and moist. No oropharyngeal exudate.  Sinuses nontender.   Eyes: EOM are normal. Pupils are equal, round, and reactive to light.  Cardiovascular: Normal rate, regular rhythm and normal heart sounds.  Exam reveals no gallop and no friction rub.   No murmur heard. Pulmonary/Chest: Effort normal and breath sounds normal. No respiratory distress. She has no wheezes. She has no rales.  Abdominal: Soft. She exhibits no distension. There is no tenderness. There is no rebound.  Musculoskeletal: Normal range of motion. She exhibits no edema or tenderness.  Lymphadenopathy:    She has no cervical adenopathy.  Neurological: She is alert and oriented to  person, place, and time. No cranial nerve deficit.  Skin: Skin is warm. She is not diaphoretic.  Psychiatric: She has a normal mood and affect. Her behavior is normal.  Vitals reviewed.         Assessment & Plan:  Please see problem based assessment and plan.

## 2014-01-24 DIAGNOSIS — J309 Allergic rhinitis, unspecified: Secondary | ICD-10-CM | POA: Insufficient documentation

## 2014-01-24 NOTE — Assessment & Plan Note (Addendum)
Cough, congestion, rhinorrhea and postnasal drip c/w allergic rhinitis.  She has a headache likely attributable to coughing.  No sinus tenderness to suggest sinusitis.  There may be a viral component which will not change management.  No dyspnea and lungs clear making lower respiratory infection unlikely.  No change in chronic pain and she was able to drive herself here today so flu is less likely.  - resume flonase nasal spray - rx for Mucinex for cough - supportive care - fluids, throat lozenges - she was advised to return to clinic if symptoms do not improve by the end of the week (in three days) or start to worsen

## 2014-01-24 NOTE — Progress Notes (Signed)
Case discussed with Dr. Wilson soon after the resident saw the patient.  We reviewed the resident's history and exam and pertinent patient test results.  I agree with the assessment, diagnosis and plan of care documented in the resident's note. 

## 2014-01-24 NOTE — Addendum Note (Signed)
Addended by: Doneen PoissonKLIMA, Fantashia Shupert D on: 01/24/2014 06:01 PM   Modules accepted: Level of Service

## 2014-02-19 ENCOUNTER — Ambulatory Visit (INDEPENDENT_AMBULATORY_CARE_PROVIDER_SITE_OTHER): Payer: Commercial Managed Care - HMO | Admitting: Internal Medicine

## 2014-02-19 ENCOUNTER — Encounter: Payer: Self-pay | Admitting: Internal Medicine

## 2014-02-19 VITALS — BP 187/106 | HR 82 | Temp 98.3°F | Ht 63.0 in | Wt 257.6 lb

## 2014-02-19 DIAGNOSIS — R1033 Periumbilical pain: Secondary | ICD-10-CM

## 2014-02-19 DIAGNOSIS — R1013 Epigastric pain: Secondary | ICD-10-CM

## 2014-02-19 DIAGNOSIS — R1084 Generalized abdominal pain: Secondary | ICD-10-CM

## 2014-02-19 DIAGNOSIS — R109 Unspecified abdominal pain: Secondary | ICD-10-CM | POA: Insufficient documentation

## 2014-02-19 LAB — COMPLETE METABOLIC PANEL WITHOUT GFR
ALT: 7 U/L (ref 0–35)
AST: 15 U/L (ref 0–37)
Albumin: 3.6 g/dL (ref 3.5–5.2)
Alkaline Phosphatase: 53 U/L (ref 39–117)
BUN: 11 mg/dL (ref 6–23)
CO2: 26 meq/L (ref 19–32)
Calcium: 9.2 mg/dL (ref 8.4–10.5)
Chloride: 101 meq/L (ref 96–112)
Creat: 0.89 mg/dL (ref 0.50–1.10)
GFR, Est African American: 81 mL/min
GFR, Est Non African American: 71 mL/min
Glucose, Bld: 95 mg/dL (ref 70–99)
Potassium: 4 meq/L (ref 3.5–5.3)
Sodium: 138 meq/L (ref 135–145)
Total Bilirubin: 0.4 mg/dL (ref 0.3–1.2)
Total Protein: 8 g/dL (ref 6.0–8.3)

## 2014-02-19 LAB — CBC WITH DIFFERENTIAL/PLATELET
Basophils Absolute: 0 10*3/uL (ref 0.0–0.1)
Basophils Relative: 0 % (ref 0–1)
Eosinophils Absolute: 0.1 10*3/uL (ref 0.0–0.7)
Eosinophils Relative: 3 % (ref 0–5)
HCT: 34.4 % — ABNORMAL LOW (ref 36.0–46.0)
Hemoglobin: 10.8 g/dL — ABNORMAL LOW (ref 12.0–15.0)
Lymphocytes Relative: 50 % — ABNORMAL HIGH (ref 12–46)
Lymphs Abs: 1.9 10*3/uL (ref 0.7–4.0)
MCH: 23.8 pg — ABNORMAL LOW (ref 26.0–34.0)
MCHC: 31.4 g/dL (ref 30.0–36.0)
MCV: 75.8 fL — ABNORMAL LOW (ref 78.0–100.0)
MPV: 11 fL (ref 9.4–12.4)
Monocytes Absolute: 0.3 10*3/uL (ref 0.1–1.0)
Monocytes Relative: 7 % (ref 3–12)
Neutro Abs: 1.5 10*3/uL — ABNORMAL LOW (ref 1.7–7.7)
Neutrophils Relative %: 40 % — ABNORMAL LOW (ref 43–77)
Platelets: 237 10*3/uL (ref 150–400)
RBC: 4.54 MIL/uL (ref 3.87–5.11)
RDW: 14.4 % (ref 11.5–15.5)
WBC: 3.8 10*3/uL — ABNORMAL LOW (ref 4.0–10.5)

## 2014-02-19 LAB — URINALYSIS, ROUTINE W REFLEX MICROSCOPIC
Bilirubin Urine: NEGATIVE
Glucose, UA: NEGATIVE mg/dL
Hgb urine dipstick: NEGATIVE
Ketones, ur: NEGATIVE mg/dL
Leukocytes, UA: NEGATIVE
Nitrite: NEGATIVE
Protein, ur: NEGATIVE mg/dL
Specific Gravity, Urine: 1.017 (ref 1.005–1.030)
Urobilinogen, UA: 0.2 mg/dL (ref 0.0–1.0)
pH: 7 (ref 5.0–8.0)

## 2014-02-19 LAB — LIPASE: Lipase: 59 U/L (ref 0–75)

## 2014-02-19 MED ORDER — ONDANSETRON HCL 4 MG PO TABS: 4.0000 mg | ORAL_TABLET | Freq: Three times a day (TID) | ORAL | Status: DC | PRN

## 2014-02-19 MED ORDER — OMEPRAZOLE 40 MG PO CPDR: 40.0000 mg | DELAYED_RELEASE_CAPSULE | Freq: Every day | ORAL | Status: DC

## 2014-02-19 MED ORDER — TRAMADOL HCL 50 MG PO TABS: 50.0000 mg | ORAL_TABLET | Freq: Three times a day (TID) | ORAL | Status: DC

## 2014-02-19 MED ORDER — GI COCKTAIL ~~LOC~~: 20.0000 mL | Freq: Two times a day (BID) | ORAL | Status: DC

## 2014-02-19 NOTE — Progress Notes (Signed)
Patient ID: Nancy [name], female   DOB: 08/31/1955, 58 y.o.   MRN: [id number]   Subjective:   Patient ID: Nancy ChristiansLinda S Miles female   DOB: 08/17/1955 58 y.o.   MRN: [id number]  HPI: Ms.Ritta S Bruna Pottereague is a 58 y.o. with PMH listed below. Presented today with abdominal pain of 2 weeks duration- middle of her stomach. Some relief after having a bowel movement, constant, achjing pain she describes, but she can not tell if there is radiation. No prior episodes. Pt denies alcohol intake except during the holidays . Pain is worse witheating.  Vomited once last week- Non bloody.  Pt has also been having diarrhea of about 2 weeks now also- present only after eating something, about 2 times a day. Bowl movements are described as loose, not watery. Stools ate with cramps, non bloody. Poor appetite also present.  Pt endorses making urine- though she says it sis a strong odour, that she wakes up several times at a night to pass urine. No dysuria. Pt says he is thirsty all the time. Pt was dizzy two days ago, but not today.  No fever, no change in Cough.- Has hx of allergies.  She thought it ws a 24 hour virus. Pts husband thinks it might be form her teeth which have started cracking and breaking off. No sick contacts. Pt endorses taking almost daily over the counter ibuprofen for chronic pain, once daily.    Past Medical History  Diagnosis Date  . Primary Sjogren's syndrome     anti Ro+;ANA>1:1280 in homogen pattern, negative ds DNA/RF/anti Smith/RNP/C3-4 comp/la/jo1/Scleroderma/centromere, neg HIV/ACE/Hep B/C, nl CXR 2/05, schirmer salivary glandtest not done, symptom rx:eye drops/prednisone/plaquenil [facility], Dr. Rushie NyhanBourne/o'Rourke 3/07. saw [name] but stopped 2/2 cost.  . Hypertension     no LVH EKG-7/05, nl M/C ratio 4/07  . Morbid obesity   . Back pain     chronic low back pain L4-5 discectomy:11/99. degenerative thoracic spondylotic changes 4/07  . Obstructive sleep apnea     severe  7/05 RD 161 per hr. /CPAP 18cwp  . Hypothyroidism     09/1998  . Microcytic anemia     09/1998. Hb-10.5/MCV 76. needs ferritin to determine ACD vs IDA  . Menorrhagia   . Uterine bleeding   . Lower extremity edema     echo EF 55-65% w/o evidence of Dias dysfx.,   . Polyarthralgia     (knee/back/ankle) w/dx of fibromyalgia? [facility], knee pain chronic 2/2 obesity, fibromyalgia and Sjogren's.  . Chest pain     neg adenosine myoview.   Current Outpatient Prescriptions  Medication Sig Dispense Refill  . chlorthalidone (HYGROTON) 50 MG tablet TAKE 1 TABLET BY MOUTH EVERY DAY 30 tablet 2  . cyclobenzaprine (FLEXERIL) 5 MG tablet Take 1 tablet (5 mg total) by mouth 3 (three) times daily as needed for muscle spasms. 30 tablet 0  . docusate sodium (COLACE) 100 MG capsule Take 1 capsule (100 mg total) by mouth 2 (two) times daily as needed for constipation. 30 capsule 2  . ferrous sulfate 325 (65 FE) MG tablet Take 1 tablet (325 mg total) by mouth 3 (three) times daily with meals. 90 tablet 2  . fluticasone (FLONASE) 50 MCG/ACT nasal spray PLACE 1 SPRAY INTO THE NOSE EVERY MORNING. 16 g 1  . guaiFENesin (MUCINEX) 600 MG 12 hr tablet Take 1 tablet (600 mg total) by mouth 2 (two) times daily. 30 tablet 0  . HYDROcodone-acetaminophen (NORCO/VICODIN) 5-325 MG per  tablet Take 1 tablet by mouth every 12 (twelve) hours as needed. 60 tablet 0  . ibuprofen (ADVIL) 400 MG tablet Take 1 tablet (400 mg total) by mouth every 6 (six) hours as needed. 100 tablet 2  . levothyroxine (SYNTHROID, LEVOTHROID) 50 MCG tablet TAKE 1 TABLET BY MOUTH EVERY DAY 90 tablet 1  . lisinopril (PRINIVIL,ZESTRIL) 10 MG tablet Take 1 tablet (10 mg total) by mouth daily. 30 tablet 2  . megestrol (MEGACE) 40 MG tablet Take 2 tablets (80 mg total) by mouth daily. 60 tablet 5  . metoprolol (LOPRESSOR) 50 MG tablet Take 1 tablet (50 mg total) by mouth 2 (two) times daily. 60 tablet 2  . PROAIR HFA 108 (90 BASE) MCG/ACT inhaler  INHALE 1 PUFF INTO THE LUNGS EVERY 6 (SIX) HOURS AS NEEDED FOR WHEEZING. 8.5 each 2   No current facility-administered medications for this visit.   Family History  Problem Relation Age of Onset  . Hypertension Mother   . Cancer Mother   . Cancer Father   . Cancer Sister   . Ovarian cancer      family history  . Breast cancer      family history in 1st degree relatives.  . Thyroid disease Sister   . Colon cancer Neg Hx   . Stomach cancer Neg Hx    History   Social History  . Marital Status: Married    Spouse Name: N/A    Number of Children: N/A  . Years of Education: N/A   Occupational History  . house wife    Social History Main Topics  . Smoking status: Never Smoker   . Smokeless tobacco: Never Used  . Alcohol Use: No  . Drug Use: No  . Sexual Activity:    Partners: Male    Birth Control/ Protection: Post-menopausal   Other Topics Concern  . None   Social History Narrative   Her daughter [name]) often comes with her to visits. She also has a grand daughter and great grand daughter(whose name is Stark Bray)   Review of Systems: CONSTITUTIONAL- No Fever, weightloss, night sweat or change in appetite. SKIN- No Rash, colour changes or itching. HEAD- Has Headache and dizziness. EARS- No vertigo, hearing loss or ear discharge. Mouth/throat- No Sorethroat, dentures, or bleeding gums. RESPIRATORY- No Cough or SOB. CARDIAC- No Palpitations, DOE, PND or chest pain. URINARY- Frequency- several times at night, but no urgency, straining or dysuria. NEUROLOGIC- No Numbness, syncope, seizures or burning. [facility]- Denies depression or anxiety.  Objective:  Physical Exam: Filed Vitals:   02/19/14 0856  BP: 187/106  Pulse: 82  Temp: 98.3 F (36.8 C)  TempSrc: Oral  Height: 5\' 3"  (1.6 m)  Weight: 257 lb 9.6 oz (116.847 kg)  SpO2: 98%   GENERAL- alert, co-operative, appears as stated age, in mild distress, intermittent dry heaving. HEENT- Atraumatic, normocephalic,  PERRL, EOMI, oral mucosa appears moist, no pharyngeal erythema.  neck supple. CARDIAC- RRR, no murmurs, rubs or gallops. RESP- Moving equal volumes of air, and clear to auscultation bilaterally, no wheezes or crackles. ABDOMEN- Soft, tenderness epigastric region, also across umbilical region, extending from left to right, no guarding or rebound, no palpable masses or organomegaly, bowel sounds reduced. BACK- Normal curvature of the spine, No tenderness along the vertebrae, no CVA tenderness. NEURO- No obvious Cr N abnormality, strenght upper and lower extremities- intact, Sensation intact- globally, DTRs- Normal, finger to nose test normal bilat, rapid alternating movement- intact, Gait- Normal. EXTREMITIES- pulse 2+, symmetric, no  pedal edema. SKIN- Warm, dry, No rash or lesion. PSYCH- Normal mood and affect, appropriate thought content and speech.  Assessment & Plan:  The patient's case and plan of care was discussed with attending physician, Dr. Criselda PeachesMullen.  Please see problem based charting for assessment and plan.

## 2014-02-19 NOTE — Patient Instructions (Addendum)
General Instructions:   We will be prescribing a new medication for you called Omeprazole for your  Stomach. We think you have an ulcer in your stomach that is causing the pain.  PLEASE DO NOT TAKE IBUPROFEN, ADVIL, ALEVE, or motrin or Naproxen. This is very important to allow the ulcer to heal.  Take the omeprazole every morning before meals for 8 weeks.  Also take the medication for the nausea called ondansetron. Take it as needed, three times a day.  If you do not feel better by the end of the week, come back and see us.   Please bring your medicines with you each time you come to clinic.  Medicines may include prescription medications, over-the-counter medications, herbal remedies, eye drops, vitamins, or other pills.

## 2014-02-19 NOTE — Assessment & Plan Note (Addendum)
Mostly epigastric an umbilical region- extending from left to right. No suprapubic tenderness, Right or left lower quadrant pain. Pain worse with meals. Diarrhea with meals. Differentials include Peptic ulcer disease, Colitis- Infectious, Pancreatitis. Abdomen is not acute- as it is soft.  Doubt Appendicitis- Without Fever, persistent umbilical pain, and transferring to the right lower quadrant, and no tenderness in right lower quad on exam. Pt also endorses frequency at night, but with out dysuria of pain in lower abdomen. Also consider a partial SBO, with post obstructive diarrhea considering paucity of bowel sounds.  Plan- CBC - CMET - Lipase - UA - Pt to wait in clinic to get lab results, and then consider if imaging will be warranted.  Addendum- CBC- WBC- 3.8, but it appears pt runs low, last WBC- 9 months ago- 3.6, not suggestive of a significant colitis. - Hgb- 10.8, 9 months ago- 11.2, so stable. - CMET- Unremarkable for Liver pathology or gall bladder dx. - Lipase- 59 - Considering results likely Gastric ulcer- Will start PPI- Protonix 40mg  daily for 6 weeks.  - Zofran- 4mg  TID for 1 week - Omeprazole- 40mg  daily for 8 weeks. - Prescribed GI cocktail,. - See in 1 week  - Tramadol 50mg  TID for pain- 320 pills. - If not improving consider Ct chest.

## 2014-02-20 NOTE — Addendum Note (Signed)
Addended by: Debe CoderMULLEN, Don Giarrusso B on: 02/20/2014 05:04 PM   Modules accepted: Level of Service

## 2014-02-20 NOTE — Progress Notes (Signed)
Internal Medicine Clinic Attending  I saw and evaluated the patient.  I personally confirmed the key portions of the history and exam documented by Dr. Emokpae and I reviewed pertinent patient test results.  The assessment, diagnosis, and plan were formulated together and I agree with the documentation in the resident's note. 

## 2014-02-23 ENCOUNTER — Other Ambulatory Visit: Payer: Self-pay | Admitting: *Deleted

## 2014-02-23 DIAGNOSIS — M25562 Pain in left knee: Secondary | ICD-10-CM

## 2014-02-23 NOTE — Telephone Encounter (Signed)
Pt # [id number]

## 2014-02-26 ENCOUNTER — Ambulatory Visit: Payer: Commercial Managed Care - HMO | Admitting: Internal Medicine

## 2014-03-20 ENCOUNTER — Ambulatory Visit (INDEPENDENT_AMBULATORY_CARE_PROVIDER_SITE_OTHER): Payer: Medicare HMO | Admitting: Internal Medicine

## 2014-03-20 ENCOUNTER — Telehealth: Payer: Self-pay | Admitting: *Deleted

## 2014-03-20 ENCOUNTER — Encounter: Payer: Self-pay | Admitting: Internal Medicine

## 2014-03-20 VITALS — BP 172/84 | HR 72 | Temp 97.9°F | Ht 63.0 in | Wt 251.8 lb

## 2014-03-20 DIAGNOSIS — E039 Hypothyroidism, unspecified: Secondary | ICD-10-CM

## 2014-03-20 DIAGNOSIS — E038 Other specified hypothyroidism: Secondary | ICD-10-CM

## 2014-03-20 DIAGNOSIS — R1084 Generalized abdominal pain: Secondary | ICD-10-CM

## 2014-03-20 DIAGNOSIS — I10 Essential (primary) hypertension: Secondary | ICD-10-CM

## 2014-03-20 LAB — TSH: TSH: 3.192 u[IU]/mL (ref 0.350–4.500)

## 2014-03-20 MED ORDER — GI COCKTAIL ~~LOC~~: 10.0000 mL | Freq: Two times a day (BID) | ORAL | Status: DC | PRN

## 2014-03-20 NOTE — Progress Notes (Signed)
Patient ID: Nancy [name], female   DOB: 04/27/1955, 59 y.o.MRN: [id number]    Subjective:   Patient ID: Nancy [name] female    DOB: 07/31/1955 59 y.o.    MRN: [id number] Health Maintenance Due: Health Maintenance Due  Topic Date Due  . ZOSTAVAX  03/13/2013  . INFLUENZA VACCINE  10/07/2013  . MAMMOGRAM  10/12/2013    _________________________________________________  HPI: Nancy [name] is a 59 y.o. female here for abdominal pain.  Pt has a PMH outlined below.  Please see problem-based charting assessment and plan note for further details of medical issues addressed at today'[name] visit.  PMH: Past Medical History  Diagnosis Date  . Primary Sjogren'[name] syndrome     anti Ro+;ANA>1:1280 in homogen pattern, negative ds DNA/RF/anti Smith/RNP/C3-4 comp/la/jo1/Scleroderma/centromere, neg HIV/ACE/Hep B/C, nl CXR 2/05, schirmer salivary glandtest not done, symptom rx:eye drops/prednisone/plaquenil [facility], Dr. Rushie NyhanBourne/o'Rourke 3/07. saw [name] but stopped 2/2 cost.  . Hypertension     no LVH EKG-7/05, nl M/C ratio 4/07  . Morbid obesity   . Back pain     chronic low back pain L4-5 discectomy:11/99. degenerative thoracic spondylotic changes 4/07  . Obstructive sleep apnea     severe 7/05 RD 161 per hr. /CPAP 18cwp  . Hypothyroidism     09/1998  . Microcytic anemia     09/1998. Hb-10.5/MCV 76. needs ferritin to determine ACD vs IDA  . Menorrhagia   . Uterine bleeding   . Lower extremity edema     echo EF 55-65% w/o evidence of Dias dysfx.,   . Polyarthralgia     (knee/back/ankle) w/dx of fibromyalgia? [facility], knee pain chronic 2/2 obesity, fibromyalgia and Sjogren'[name].  . Chest pain     neg adenosine myoview.    Medications: Current Outpatient Prescriptions on File Prior to Visit  Medication Sig Dispense Refill  . chlorthalidone (HYGROTON) 50 MG tablet TAKE 1 TABLET BY MOUTH EVERY DAY 30 tablet 2  . cyclobenzaprine (FLEXERIL) 5 MG tablet Take 1  tablet (5 mg total) by mouth 3 (three) times daily as needed for muscle spasms. 30 tablet 0  . docusate sodium (COLACE) 100 MG capsule Take 1 capsule (100 mg total) by mouth 2 (two) times daily as needed for constipation. 30 capsule 2  . ferrous sulfate 325 (65 FE) MG tablet Take 1 tablet (325 mg total) by mouth 3 (three) times daily with meals. 90 tablet 2  . fluticasone (FLONASE) 50 MCG/ACT nasal spray PLACE 1 SPRAY INTO THE NOSE EVERY MORNING. 16 g 1  . guaiFENesin (MUCINEX) 600 MG 12 hr tablet Take 1 tablet (600 mg total) by mouth 2 (two) times daily. 30 tablet 0  . ibuprofen (ADVIL) 400 MG tablet Take 1 tablet (400 mg total) by mouth every 6 (six) hours as needed. 100 tablet 2  . levothyroxine (SYNTHROID, LEVOTHROID) 50 MCG tablet TAKE 1 TABLET BY MOUTH EVERY DAY 90 tablet 1  . lisinopril (PRINIVIL,ZESTRIL) 10 MG tablet Take 1 tablet (10 mg total) by mouth daily. 30 tablet 2  . megestrol (MEGACE) 40 MG tablet Take 2 tablets (80 mg total) by mouth daily. 60 tablet 5  . metoprolol (LOPRESSOR) 50 MG tablet Take 1 tablet (50 mg total) by mouth 2 (two) times daily. 60 tablet 2  . omeprazole (PRILOSEC) 40 MG capsule Take 1 capsule (40 mg total) by mouth daily. 30 capsule 1  . ondansetron (ZOFRAN) 4 MG tablet Take 1 tablet (4 mg total) by mouth every 8 (eight)  hours as needed for nausea or vomiting. 20 tablet 0  . PROAIR HFA 108 (90 BASE) MCG/ACT inhaler INHALE 1 PUFF INTO THE LUNGS EVERY 6 (SIX) HOURS AS NEEDED FOR WHEEZING. 8.5 each 2  . traMADol (ULTRAM) 50 MG tablet Take 1 tablet (50 mg total) by mouth 3 (three) times daily. 30 tablet 0   No current facility-administered medications on file prior to visit.    Allergies: Allergies  Allergen Reactions  . Amlodipine     Lower extremity swelling.  This reaction only occurred when taking GENERIC amlodipine.  She previously tolerated brand name Norvasc well.  . Coconut Fatty Acids     Break out  . Naproxen Sodium Nausea And Vomiting    Says she  can take ibuprofen     FH: Family History  Problem Relation Age of Onset  . Hypertension Mother   . Cancer Mother   . Cancer Father   . Cancer Sister   . Ovarian cancer      family history  . Breast cancer      family history in 1st degree relatives.  . Thyroid disease Sister   . Colon cancer Neg Hx   . Stomach cancer Neg Hx     SH: History   Social History  . Marital Status: Married    Spouse Name: N/A    Number of Children: N/A  . Years of Education: N/A   Occupational History  . house wife    Social History Main Topics  . Smoking status: Never Smoker   . Smokeless tobacco: Never Used  . Alcohol Use: No  . Drug Use: No  . Sexual Activity:    Partners: Male    Birth Control/ Protection: Post-menopausal   Other Topics Concern  . None   Social History Narrative   Her daughter [name]) often comes with her to visits. She also has a grand daughter and great grand daughter(whose name is Nancy [name])    Review of Systems: Constitutional: Negative for fever, chills and weight loss.  Eyes: Negative for blurred vision.  Respiratory: Negative for cough and shortness of breath.  Cardiovascular: Negative for chest pain, palpitations and leg swelling.  Gastrointestinal: +nausea/vomiting,+abdominal pain, +diarrhea, +constipation and -blood in stool.  Genitourinary: Negative for dysuria, urgency and frequency.  Musculoskeletal: Negative for myalgias and +back pain.  Neurological: Negative for dizziness, weakness and headaches.     Objective:   Vital Signs: Filed Vitals:   03/20/14 0936  BP: 172/84  Pulse: 72  Temp: 97.9 F (36.6 C)  TempSrc: Oral  Height:  (1.6 m)  Weight: 251 lb 12.8 oz (114.216 kg)  SpO2: 100%      BP Readings from Last 3 Encounters:  03/20/14 172/84  02/19/14 187/106  01/23/14 167/91    Physical Exam: Constitutional: Vital signs reviewed.  Patient is well-developed and well-nourished in NAD and cooperative with exam. She is  sitting in a wheelchair.  Head: Normocephalic and atraumatic. Eyes: PERRL, EOMI, conjunctivae nl, no scleral icterus.  Neck: Supple. Cardiovascular: RRR, no MRG. Pulmonary/Chest: normal effort, CTAB, no wheezes, rales, or rhonchi. Abdominal: Obese. Soft. Diffuse tenderness, greater aound umbilicus and epigastric area, no rebound or guarding. +BS. Neurological: A&O x3, cranial nerves II-XII are grossly intact, moving all extremities. Extremities: 2+DP b/l; no pitting edema. Skin: Warm, dry and intact. No rash.   Assessment & Plan:   Assessment and plan was discussed and formulated with my attending.   -pt requesting refill of hydrocodone (discussed with PCP-->not authorized)  -  pt requesting "joy stick" repair of her motorized wheelchair

## 2014-03-20 NOTE — Telephone Encounter (Signed)
Pt aware Dr Heide SparkNarendra denied med - pt will sch an appt with Dr Heide SparkNarendra regarding back pain. Stanton KidneyDebra [name] RN 03/20/14 3:20PM

## 2014-03-20 NOTE — Assessment & Plan Note (Signed)
BP 172/84.   -continue current meds -f/u with PCP

## 2014-03-20 NOTE — Assessment & Plan Note (Signed)
Pt reports abdominal pain that began in December.  States that the pain was somewhat relieved by the GI cocktail given at last OV but has run out.  She describes the pain as sharp in the mid-abdomen that comes and goes.  Denies fever/chills, weight loss, previous abd surgeries.  Reports nausea and 1 episodes of vomiting of "tea."  States she has constipation at times but also loose stools.  No dysphagia.  Reports bloating and early satiety.  No relation to food.  Had a colonoscopy in 12/2013 (Dr. Juanda ChanceBrodie) that showed small internal hemorrhoids.  Has not had an EGD.  No h/o DM.  There is a positive FH for ovarian cancer in her mother.  Had negative pelvic US in prior years for ovarian pathology.  Possible etiologies PUD, IBS, colitis, gastroparesis (is on chronic opiods), medication side effects.   -obtain CT abd/pelvis (will need to get PA based on insurance) -refill GI cocktail  -consider GI referral pending CT findings -may need EGD -continue current meds -pt requests refill of hydrocodone but discussed with Dr. Heide SparkNarendra and will not authorize refill as she is already receiving tramadol and has not gotten hydrocodne since [place]~June

## 2014-03-20 NOTE — Telephone Encounter (Signed)
Pt called needs a refill on Norco 5-325mg  for back pain. Stanton KidneyDebra Takeia Ciaravino RN 03/20/14 1:50PM

## 2014-03-20 NOTE — Telephone Encounter (Signed)
Patient has not been on narco since 08/2013. I can not refill this medication at this time. She will need to make an appointment with me to re assess her pain but I do not believe that this is a medication that should be given chronically for her.

## 2014-03-20 NOTE — Patient Instructions (Signed)
Thank you for your visit today.   Please return to the internal medicine clinic in as needed.   Your current medical regimen is effective;  continue present plan and take all medications as prescribed.   We will check imaging of your abdominal area to see what might be causing your pain.   Please be sure to bring all of your medications with you to every visit; this includes herbal supplements, vitamins, eye drops, and any over-the-counter medications.   Should you have any questions regarding your medications and/or any new or worsening symptoms, please be sure to call the clinic at [phone].   If you believe that you are suffering from a life threatening condition or one that may result in the loss of limb or function, then you should call 911 or proceed to the nearest Emergency Department.     A healthy lifestyle and preventative care can promote health and wellness.   Maintain regular health, dental, and eye exams.  Eat a healthy diet. Foods like vegetables, fruits, whole grains, low-fat dairy products, and lean protein foods contain the nutrients you need without too many calories. Decrease your intake of foods high in solid fats, added sugars, and salt. Get information about a proper diet from your caregiver, if necessary.  Regular physical exercise is one of the most important things you can do for your health. Most adults should get at least 150 minutes of moderate-intensity exercise (any activity that increases your heart rate and causes you to sweat) each week. In addition, most adults need muscle-strengthening exercises on 2 or more days a week.   Maintain a healthy weight. The body mass index (BMI) is a screening tool to identify possible weight problems. It provides an estimate of body fat based on height and weight. Your caregiver can help determine your BMI, and can help you achieve or maintain a healthy weight. For adults 20 years and older:  A BMI below 18.5 is  considered underweight.  A BMI of 18.5 to 24.9 is normal.  A BMI of 25 to 29.9 is considered overweight.  A BMI of 30 and above is considered obese.

## 2014-03-20 NOTE — Assessment & Plan Note (Signed)
-   check TSH

## 2014-03-21 NOTE — Progress Notes (Signed)
INTERNAL MEDICINE TEACHING ATTENDING ADDENDUM - Hilary Milks, MD: I reviewed and discussed at the time of visit with the resident Dr. Gill, the patient's medical history, physical examination, diagnosis and results of pertinent tests and treatment and I agree with the patient's care as documented.  

## 2014-03-22 ENCOUNTER — Telehealth: Payer: Self-pay | Admitting: Internal Medicine

## 2014-03-22 NOTE — Telephone Encounter (Signed)
I called patient to discuss her request for vicodin. I explained that it had not been given to her since June of last year and I would be unable to refill this medication without evaluating her further and that I was uncertain if this was the most appropriate medication for her pain. I encouraged her to make [facility] appointment with [facility] for [facility] of her pain and possible referral to pain clinic. She is upset that she did not receive her pain medication but expresses understanding. I discussed this with Rica Moteoris R.N and she will set her up with another appointment

## 2014-03-29 ENCOUNTER — Encounter: Payer: Self-pay | Admitting: Internal Medicine

## 2014-03-29 ENCOUNTER — Ambulatory Visit (INDEPENDENT_AMBULATORY_CARE_PROVIDER_SITE_OTHER): Payer: Medicare HMO | Admitting: Internal Medicine

## 2014-03-29 VITALS — BP 173/96 | HR 67 | Temp 98.0°F | Ht 63.0 in | Wt 257.2 lb

## 2014-03-29 DIAGNOSIS — G8929 Other chronic pain: Secondary | ICD-10-CM

## 2014-03-29 DIAGNOSIS — I10 Essential (primary) hypertension: Secondary | ICD-10-CM

## 2014-03-29 DIAGNOSIS — R51 Headache: Secondary | ICD-10-CM

## 2014-03-29 DIAGNOSIS — M545 Low back pain, unspecified: Secondary | ICD-10-CM

## 2014-03-29 DIAGNOSIS — M25569 Pain in unspecified knee: Secondary | ICD-10-CM

## 2014-03-29 DIAGNOSIS — R519 Headache, unspecified: Secondary | ICD-10-CM

## 2014-03-29 MED ORDER — MELOXICAM 7.5 MG PO TABS: 7.5000 mg | ORAL_TABLET | Freq: Every day | ORAL | Status: DC

## 2014-03-29 MED ORDER — ACETAMINOPHEN 500 MG PO TABS
1000.0000 mg | ORAL_TABLET | Freq: Once | ORAL | Status: AC
  Administered 2014-03-29: 1000 mg via ORAL

## 2014-03-29 MED ORDER — CYCLOBENZAPRINE HCL 5 MG PO TABS: 5.0000 mg | ORAL_TABLET | Freq: Three times a day (TID) | ORAL | Status: AC | PRN

## 2014-03-29 MED ORDER — ACETAMINOPHEN-CODEINE #3 300-30 MG PO TABS: 1.0000 | ORAL_TABLET | ORAL | Status: DC | PRN

## 2014-03-29 NOTE — Assessment & Plan Note (Signed)
Patient reports headache now. Frontal, bilateral, squeezing pain, no associated symptoms. Likely tension headache.  Wants medication.   -Tylenol 1000mg  administered in office.

## 2014-03-29 NOTE — Assessment & Plan Note (Signed)
Exam suggests OA of knees.  I suspect treatment with Mobic for low back pain will also help her knee pain.  I also advised weight loss and exercise when her pain is better controlled.  If pain persists we could consider repeating some Xrays of her knees.

## 2014-03-29 NOTE — Progress Notes (Signed)
[facility] Subjective:   Patient ID: Nancy [name] female   DOB: 02/03/1956 59 y.o.   MRN: [id number]  HPI: [name] is a 59 y.o. female with a PMH below who presents for an acute visit for low back pain. She reports low back pain started about 9 years ago.  Knee pain started about 5 years ago.  Never had steroid injections in knees.  Does report she had a spinal fusion many years ago (per records a L4-5 discectomy in 1999). She complains today of low back and bilateral knee pain.  She reports the pain is a 10/10.  It has been going on for a week.  She reports at times over the past few years the pain can be well controlled.  She feels the pain is worse due to the weather.  She has had no falls or other trauma.  She reports she has had more difficulty moving around over the past few days and occasionally uses a wheelchair around the house. Patient denies fecal incontinence, urinary retention or overflow incontinence, night sweats, unexplained fevers or weight loss, h/o cancer, IVDU, recent trauma.  She does report that the pain can be so severe that it can wake her from sleep.    She was previously treated with Ibuprofen 400mg  once a day, and up until June with PRN Norco 5-325 #30 per month.  She received a Rx for Tramadol 50mg  #30 in December.  She believes she may have taken this medications twice, she does not feel that she got any relief from this medication.  Lumbar Xray 05/2007: Mild DDD and facet arthropathy at L5-S1.Marland Kitchen. L4-5 operative findings unremarkable. No acute findings.  Right knee xray 10/2003: No acute bony abnormality  Past Medical History  Diagnosis Date  . Primary Sjogren's syndrome     anti Ro+;ANA>1:1280 in homogen pattern, negative ds DNA/RF/anti Smith/RNP/C3-4 comp/la/jo1/Scleroderma/centromere, neg HIV/ACE/Hep B/C, nl CXR 2/05, schirmer salivary glandtest not done, symptom rx:eye drops/prednisone/plaquenil [facility], Dr.  Rushie NyhanBourne/o'Rourke 3/07. saw [name] but stopped 2/2 cost.  . Hypertension     no LVH EKG-7/05, nl M/C ratio 4/07  . Morbid obesity   . Back pain     chronic low back pain L4-5 discectomy:11/99. degenerative thoracic spondylotic changes 4/07  . Obstructive sleep apnea     severe 7/05 RD 161 per hr. /CPAP 18cwp  . Hypothyroidism     09/1998  . Microcytic anemia     09/1998. Hb-10.5/MCV 76. needs ferritin to determine ACD vs IDA  . Menorrhagia   . Uterine bleeding   . Lower extremity edema     echo EF 55-65% w/o evidence of Dias dysfx.,   . Polyarthralgia     (knee/back/ankle) w/dx of fibromyalgia? [facility], knee pain chronic 2/2 obesity, fibromyalgia and Sjogren's.  . Chest pain     neg adenosine myoview.   Current Outpatient Prescriptions  Medication Sig Dispense Refill  . acetaminophen-codeine (TYLENOL #3) 300-30 MG per tablet Take 1 tablet by mouth every 4 (four) hours as needed for severe pain. 20 tablet 0  . Alum & Mag Hydroxide-Simeth (GI COCKTAIL) SUSP suspension Take 10 mLs by mouth 2 (two) times daily as needed for indigestion. Shake well. 100 mL 0  . chlorthalidone (HYGROTON) 50 MG tablet TAKE 1 TABLET BY MOUTH EVERY DAY 30 tablet 2  . cyclobenzaprine (FLEXERIL) 5 MG tablet Take 1 tablet (5 mg total) by mouth 3 (three) times daily as needed for muscle  spasms. 30 tablet 1  . docusate sodium (COLACE) 100 MG capsule Take 1 capsule (100 mg total) by mouth 2 (two) times daily as needed for constipation. 30 capsule 2  . ferrous sulfate 325 (65 FE) MG tablet Take 1 tablet (325 mg total) by mouth 3 (three) times daily with meals. 90 tablet 2  . fluticasone (FLONASE) 50 MCG/ACT nasal spray PLACE 1 SPRAY INTO THE NOSE EVERY MORNING. 16 g 1  . guaiFENesin (MUCINEX) 600 MG 12 hr tablet Take 1 tablet (600 mg total) by mouth 2 (two) times daily. 30 tablet 0  . levothyroxine (SYNTHROID, LEVOTHROID) 50 MCG tablet TAKE 1 TABLET BY MOUTH EVERY DAY 90 tablet 1  . lisinopril  (PRINIVIL,ZESTRIL) 10 MG tablet Take 1 tablet (10 mg total) by mouth daily. 30 tablet 2  . megestrol (MEGACE) 40 MG tablet Take 2 tablets (80 mg total) by mouth daily. 60 tablet 5  . meloxicam (MOBIC) 7.5 MG tablet Take 1 tablet (7.5 mg total) by mouth daily. 30 tablet 2  . metoprolol (LOPRESSOR) 50 MG tablet Take 1 tablet (50 mg total) by mouth 2 (two) times daily. 60 tablet 2  . omeprazole (PRILOSEC) 40 MG capsule Take 1 capsule (40 mg total) by mouth daily. 30 capsule 1  . ondansetron (ZOFRAN) 4 MG tablet Take 1 tablet (4 mg total) by mouth every 8 (eight) hours as needed for nausea or vomiting. 20 tablet 0  . PROAIR HFA 108 (90 BASE) MCG/ACT inhaler INHALE 1 PUFF INTO THE LUNGS EVERY 6 (SIX) HOURS AS NEEDED FOR WHEEZING. 8.5 each 2  . traMADol (ULTRAM) 50 MG tablet Take 1 tablet (50 mg total) by mouth 3 (three) times daily. 30 tablet 0   No current facility-administered medications for this visit.   Family History  Problem Relation Age of Onset  . Hypertension Mother   . Cancer Mother   . Cancer Father   . Cancer Sister   . Ovarian cancer      family history  . Breast cancer      family history in 1st degree relatives.  . Thyroid disease Sister   . Colon cancer Neg Hx   . Stomach cancer Neg Hx    History   Social History  . Marital Status: Married    Spouse Name: N/A    Number of Children: N/A  . Years of Education: N/A   Occupational History  . house wife    Social History Main Topics  . Smoking status: Never Smoker   . Smokeless tobacco: Never Used  . Alcohol Use: No  . Drug Use: No  . Sexual Activity:    Partners: Male    Birth Control/ Protection: Post-menopausal   Other Topics Concern  . None   Social History Narrative   Her daughter [name]) often comes with her to visits. She also has a grand daughter and great grand daughter(whose name is Stark Bray)   Review of Systems: Review of Systems  Constitutional: Negative for fever, chills, weight loss and  malaise/fatigue.  Respiratory: Negative for cough and shortness of breath.   Cardiovascular: Negative for chest pain.  Gastrointestinal: Negative for heartburn and abdominal pain.  Musculoskeletal: Positive for back pain and joint pain.  Neurological: Positive for headaches. Negative for focal weakness.  Psychiatric/Behavioral: Negative for depression. The patient has insomnia. The patient is not nervous/anxious.      Objective:  Physical Exam: Filed Vitals:   03/29/14 0835  BP: 173/96  Pulse: 67  Temp: 98 F (  36.7 C)  TempSrc: Oral  Height:  (1.6 m)  Weight: 257 lb 3.2 oz (116.665 kg)  SpO2: 100%  Physical Exam  Constitutional:  Obese female, in wheelchair, tearful due to pain  Cardiovascular: Normal rate, regular rhythm and normal heart sounds.   Pulmonary/Chest: Effort normal and breath sounds normal.  Abdominal: Soft. Bowel sounds are normal.  Musculoskeletal:       Right elbow: She exhibits normal range of motion, no swelling and no effusion. No tenderness found.       Left elbow: She exhibits no effusion. Tenderness found. Medial epicondyle tenderness noted. No lateral epicondyle and no olecranon process tenderness noted.       Right knee: She exhibits no swelling, no effusion, no LCL laxity, normal meniscus and no MCL laxity. Tenderness found. Lateral joint line tenderness noted. No medial joint line tenderness noted.       Left knee: She exhibits normal range of motion, no swelling, no effusion, no LCL laxity, normal meniscus and no MCL laxity. Tenderness found. Medial joint line and lateral joint line tenderness noted.       Lumbar back: She exhibits tenderness (bilateral lumbar paraspinal spasm and tenderness), pain and spasm. She exhibits no bony tenderness.  Nursing note and vitals reviewed.   Assessment & Plan:  Case discussed with Dr. Dalphine Handing  Essential hypertension BP Readings from Last 3 Encounters:  03/29/14 173/96  03/20/14 172/84  02/19/14 187/106     Lab Results  Component Value Date   NA 138 02/19/2014   K 4.0 02/19/2014   CREATININE 0.89 02/19/2014    Assessment: Blood pressure control: moderately elevated Progress toward BP goal:  unchanged Comments: in pain  Plan: Medications:  Chlorthalidone  daily, Metoprolol  bid, and Lisinopril  daily Educational resources provided: brochure (denies) Self management tools provided:   Other plans: No changes today due to acute pain however need patient back in 1-2 weeks hopefully with pain better controlled.  She is not convincing that she is taking her medications as prescribed either.  I asked her to bring her medications to her follow up visit.    Headache Patient reports headache now. Frontal, bilateral, squeezing pain, no associated symptoms. Likely tension headache.  Wants medication.   -Tylenol  administered in office.   Acute exacerbation of chronic low back pain Patient has chronic low back pain and has been reasonably well managed in the past with NSAID therapy which was stopped due to epigastric pain that was thought to be due to GERD.  She is now in acute pain with evidence of muscle spasm on exam.  She appears to have been started on a Vicodin pain contract #30 pills a month due to wrist pain in 2013 but that appears not been needed in the more recent past.   Her GERD symptoms appear to be better controlled now. - Will start Meloxicam 7.5mg  daily for her chronic pain of her low back and knees. - For her acute pain and spasm I give her a limited Rx of Flexeril  TIDPRN and for breakthrough pain a small supply of Tylenol #3.  I do not feel that she yet needs a referral to pain management.   KNEE PAIN, CHRONIC Exam suggests OA of knees.  I suspect treatment with Mobic for low back pain will also help her knee pain.  I also advised weight loss and exercise when her pain is better controlled.  If pain persists we could consider repeating some Xrays of her  knees.     Medications Ordered Meds ordered this encounter  Medications  . acetaminophen (TYLENOL) tablet 1,000 mg    Sig:   . meloxicam (MOBIC) 7.5 MG tablet    Sig: Take 1 tablet (7.5 mg total) by mouth daily.    Dispense:  30 tablet    Refill:  2  . cyclobenzaprine (FLEXERIL) 5 MG tablet    Sig: Take 1 tablet (5 mg total) by mouth 3 (three) times daily as needed for muscle spasms.    Dispense:  30 tablet    Refill:  1  . acetaminophen-codeine (TYLENOL #3) 300-30 MG per tablet    Sig: Take 1 tablet by mouth every 4 (four) hours as needed for severe pain.    Dispense:  20 tablet    Refill:  0   Other Orders No orders of the defined types were placed in this encounter.

## 2014-03-29 NOTE — Assessment & Plan Note (Signed)
Patient has chronic low back pain and has been reasonably well managed in the past with NSAID therapy which was stopped due to epigastric pain that was thought to be due to GERD.  She is now in acute pain with evidence of muscle spasm on exam.  She appears to have been started on a Vicodin pain contract #30 pills a month due to wrist pain in 2013 but that appears not been needed in the more recent past.   Her GERD symptoms appear to be better controlled now. - Will start Meloxicam 7.5mg  daily for her chronic pain of her low back and knees. - For her acute pain and spasm I give her a limited Rx of Flexeril 5mg  TIDPRN and for breakthrough pain a small supply of Tylenol #3.  I do not feel that she yet needs a referral to pain management.

## 2014-03-29 NOTE — Assessment & Plan Note (Signed)
BP Readings from Last 3 Encounters:  03/29/14 173/96  03/20/14 172/84  02/19/14 187/106    Lab Results  Component Value Date   NA 138 02/19/2014   K 4.0 02/19/2014   CREATININE 0.89 02/19/2014    Assessment: Blood pressure control: moderately elevated Progress toward BP goal:  unchanged Comments: in pain  Plan: Medications:  Chlorthalidone 50mg  daily, Metoprolol 50mg  bid, and Lisinopril 10mg  daily Educational resources provided: brochure (denies) Self management tools provided:   Other plans: No changes today due to acute pain however need patient back in 1-2 weeks hopefully with pain better controlled.  She is not convincing that she is taking her medications as prescribed either.  I asked her to bring her medications to her follow up visit.

## 2014-03-29 NOTE — Progress Notes (Signed)
Internal Medicine Clinic Attending  Case discussed with Dr. Hoffman at the time of the visit.  We reviewed the resident's history and exam and pertinent patient test results.  I agree with the assessment, diagnosis, and plan of care documented in the resident's note.  

## 2014-03-29 NOTE — Patient Instructions (Signed)
General Instructions: Please take Mobic 7.5mg  a day for you knee and back pain,  Please continue to take protonix (pantoprazole) so this wont hurt your stomach.  You can take Cyclobenzaprine 5 mg up to 3 times a day for a few days for muscle spasm.  For breakthrough pain take Tylenol #3.  Please bring your medicines with you each time you come to clinic.  Medicines may include prescription medications, over-the-counter medications, herbal remedies, eye drops, vitamins, or other pills.   Progress Toward Treatment Goals:  No flowsheet data found.  Self Care Goals & Plans:  Self Care Goal 03/29/2014  Manage my medications take my medicines as prescribed; bring my medications to every visit; refill my medications on time  Monitor my health -  Eat healthy foods drink diet soda or water instead of juice or soda; eat more vegetables; eat foods that are low in salt; eat baked foods instead of fried foods; eat fruit for snacks and desserts  Be physically active -  Meeting treatment goals -    No flowsheet data found.   Care Management & Community Referrals:  No flowsheet data found.

## 2014-04-02 ENCOUNTER — Telehealth: Payer: Self-pay | Admitting: *Deleted

## 2014-04-02 NOTE — Telephone Encounter (Signed)
Call to patient to inform her of her scheduled CT scan for 04/03/2014 to arrive by 1:14 PM.  Patient ill also need to pick up contrast before the procedure.  Patient said that she would not be able to come on tomorrow-taking grandchildren to Dentist. Patient was given the number to Radiology to call and to reschedule the appointment.  Angelina OkGladys Chava Dulac, RN 04/02/2014 8:56 AM

## 2014-04-03 ENCOUNTER — Ambulatory Visit (HOSPITAL_COMMUNITY): Payer: Medicare HMO

## 2014-04-11 ENCOUNTER — Ambulatory Visit (HOSPITAL_COMMUNITY)
Admission: RE | Admit: 2014-04-11 | Discharge: 2014-04-11 | Disposition: A | Discharge status: Home / Self Care | Payer: Medicare HMO | Source: Ambulatory Visit | Attending: Internal Medicine | Admitting: Internal Medicine

## 2014-04-11 ENCOUNTER — Other Ambulatory Visit: Payer: Self-pay | Admitting: Internal Medicine

## 2014-04-11 DIAGNOSIS — R112 Nausea with vomiting, unspecified: Secondary | ICD-10-CM | POA: Insufficient documentation

## 2014-04-11 DIAGNOSIS — R1013 Epigastric pain: Secondary | ICD-10-CM | POA: Insufficient documentation

## 2014-04-11 DIAGNOSIS — R1084 Generalized abdominal pain: Secondary | ICD-10-CM

## 2014-04-11 MED ORDER — IOHEXOL 300 MG/ML  SOLN
100.0000 mL | Freq: Once | INTRAMUSCULAR | Status: AC | PRN
  Administered 2014-04-11: 100 mL via INTRAVENOUS

## 2014-04-12 ENCOUNTER — Other Ambulatory Visit: Payer: Self-pay | Admitting: Internal Medicine

## 2014-04-12 DIAGNOSIS — R1084 Generalized abdominal pain: Secondary | ICD-10-CM

## 2014-04-13 ENCOUNTER — Encounter: Payer: Self-pay | Admitting: Internal Medicine

## 2014-04-13 ENCOUNTER — Ambulatory Visit (INDEPENDENT_AMBULATORY_CARE_PROVIDER_SITE_OTHER): Payer: Medicare HMO | Admitting: Internal Medicine

## 2014-04-13 VITALS — BP 150/79 | HR 81 | Temp 98.2°F | Ht 63.0 in | Wt 250.2 lb

## 2014-04-13 DIAGNOSIS — G8929 Other chronic pain: Secondary | ICD-10-CM

## 2014-04-13 DIAGNOSIS — I10 Essential (primary) hypertension: Secondary | ICD-10-CM

## 2014-04-13 DIAGNOSIS — K219 Gastro-esophageal reflux disease without esophagitis: Secondary | ICD-10-CM | POA: Insufficient documentation

## 2014-04-13 DIAGNOSIS — M545 Low back pain, unspecified: Secondary | ICD-10-CM

## 2014-04-13 MED ORDER — LISINOPRIL 20 MG PO TABS: 20.0000 mg | ORAL_TABLET | Freq: Every day | ORAL | Status: DC

## 2014-04-13 MED ORDER — OMEPRAZOLE 40 MG PO CPDR: 40.0000 mg | DELAYED_RELEASE_CAPSULE | Freq: Two times a day (BID) | ORAL | Status: DC

## 2014-04-13 NOTE — Progress Notes (Signed)
   Subjective:    Patient ID: Nancy [name], female    DOB: 06/08/1955, 59 y.o.   MRN: [id number]  HPI  Patient is a 59 year old with history of hypertension, chronic back pain, GERD, allergic rhinitis, hypothyroidism who presents to clinic for a routine follow-up.  Please refer to separate problem-list charting for more details.  Review of Systems  Constitutional: Negative for fever and chills.  HENT: Negative for rhinorrhea and sore throat.   Eyes: Negative for visual disturbance.  Respiratory: Positive for cough. Negative for shortness of breath.   Cardiovascular: Negative for chest pain and palpitations.  Gastrointestinal: Positive for abdominal pain. Negative for nausea, vomiting, diarrhea, constipation and blood in stool.  Genitourinary: Negative for dysuria and hematuria.  Musculoskeletal: Positive for back pain.  Neurological: Negative for syncope.       Objective:   Physical Exam  Constitutional: She is oriented to person, place, and time. She appears well-developed and well-nourished. No distress.  HENT:  Head: Normocephalic and atraumatic.  Eyes: EOM are normal. Pupils are equal, round, and reactive to light. Left eye exhibits no discharge.  Neck: Normal range of motion. Neck supple. No thyromegaly present.  Cardiovascular: Normal rate and regular rhythm.  Exam reveals no gallop and no friction rub.   No murmur heard. Pulmonary/Chest: Effort normal and breath sounds normal. No respiratory distress. She has no wheezes. She has no rales.  Abdominal: Soft. Bowel sounds are normal. She exhibits no distension. There is no tenderness. There is no rebound.  Musculoskeletal: She exhibits edema ( Trace bilateral lower extremity edema).  Neurological: She is alert and oriented to person, place, and time. No cranial nerve deficit.  Skin: Skin is warm and dry. No rash noted.  Psychiatric: She has a normal mood and affect. Thought content normal.          Assessment & Plan:    Please refer to separate problem-list charting for more details.

## 2014-04-13 NOTE — Assessment & Plan Note (Signed)
Patient is reporting persistent epigastric pain that she describes as a burning sensation that is worse after meals. Patient denies any significant nausea or vomiting associated with it. Patient denies any constipation and only one episode of diarrhea yesterday. She states that her abdominal pain is alleviated with omeprazole. Recent CT scan showing no clear etiologies for her epigastric pain. There is evidence of possible gallstones, however her symptoms do not seem to be consistent with biliary colic. An abdominal ultrasound order is in the system though the patient has not heard any communication regarding the study. Will not further pursue the study at this point given her symptoms. -Increased omeprazole from 40 mg daily to twice a day dosing. Instructed the patient should take the PPI 30 minutes before a meal. Should patient's symptoms be refractory to this change in therapy, would recommend follow-up with GI for an EGD.

## 2014-04-13 NOTE — Patient Instructions (Addendum)
Please try to take both the meloxicam and Tylenol 3 in addition to the Flexeril to help control your back pain. I have increased the dosage of your omeprazole to 40 mg twice a day to help better control your I have also increased her lisinopril from 10 mg daily to 20 mg daily to help better control your blood pressure. Please continue taking your chlorthalidone 50 mg and metoprolol 50 mg.  General Instructions:   Thank you for bringing your medicines today. This helps us keep you safe from mistakes.   Progress Toward Treatment Goals:  Treatment Goal 04/13/2014  Blood pressure improved    Self Care Goals & Plans:  Self Care Goal 04/13/2014  Manage my medications take my medicines as prescribed; bring my medications to every visit; refill my medications on time  Monitor my health -  Eat healthy foods drink diet soda or water instead of juice or soda; eat more vegetables; eat foods that are low in salt; eat baked foods instead of fried foods; eat fruit for snacks and desserts  Be physically active -  Meeting treatment goals -    No flowsheet data found.   Care Management & Community Referrals:  Referral 03/29/2014  Referrals made for care management support none needed

## 2014-04-13 NOTE — Assessment & Plan Note (Signed)
During the last visit 2 weeks ago, patient was prescribed meloxicam 7.5 mg daily along with Flexeril 5 mg 3 times a day when necessary and Tylenol No. 3. Patient states that her pain has not been adequately controlled but stated that she has not been taking the meloxicam. Patient states that she was not sure whether she should take it in conjunction with the other medications. Patient states that hydrocodone has worked for her in the past. -Encouraged patient to try to try concurrent therapy of meloxicam along with Tylenol No. 3 and Flexeril. Will hold off on escalating therapy to stronger opiates at this point. Patient is agreeable with this plan.

## 2014-04-13 NOTE — Assessment & Plan Note (Signed)
BP Readings from Last 3 Encounters:  04/13/14 150/79  03/29/14 173/96  03/20/14 172/84    Lab Results  Component Value Date   NA 138 02/19/2014   K 4.0 02/19/2014   CREATININE 0.89 02/19/2014    Assessment: Blood pressure control: mildly elevated Progress toward BP goal:  improved Comments: Patient states that she is compliant with her chlorthalidone 50 mg daily, lisinopril 10 mg daily and metoprolol 50 mg twice a day. Patient's pain is better controlled compared to last visit although her blood pressure is still slightly elevated.  Plan: Medications:  Uptitrate lisinopril from 10 mg daily to 20 mg daily. Continue with chlorthalidone 50 mg daily and metoprolol 50 mg twice a day Educational resources provided: brochure (denies) Self management tools provided:   Other plans: None

## 2014-04-15 ENCOUNTER — Other Ambulatory Visit: Payer: Self-pay | Admitting: Internal Medicine

## 2014-04-18 NOTE — Progress Notes (Signed)
Internal Medicine Clinic Attending  Case discussed with Dr. Ngo at the time of the visit.  We reviewed the resident's history and exam and pertinent patient test results.  I agree with the assessment, diagnosis, and plan of care documented in the resident's note. 

## 2014-04-24 ENCOUNTER — Other Ambulatory Visit: Payer: Self-pay | Admitting: Internal Medicine

## 2014-05-29 ENCOUNTER — Other Ambulatory Visit: Payer: Self-pay | Admitting: Internal Medicine

## 2014-07-11 ENCOUNTER — Telehealth: Payer: Self-pay | Admitting: Internal Medicine

## 2014-07-11 NOTE — Telephone Encounter (Signed)
Call to patient to confirm appointment for 07/12/14 at 10:15 lmtcb

## 2014-07-12 ENCOUNTER — Ambulatory Visit (INDEPENDENT_AMBULATORY_CARE_PROVIDER_SITE_OTHER): Payer: Medicare HMO | Admitting: Family Medicine

## 2014-07-12 ENCOUNTER — Ambulatory Visit (INDEPENDENT_AMBULATORY_CARE_PROVIDER_SITE_OTHER): Payer: Medicare HMO | Admitting: Internal Medicine

## 2014-07-12 ENCOUNTER — Ambulatory Visit (HOSPITAL_COMMUNITY)
Admission: RE | Admit: 2014-07-12 | Discharge: 2014-07-12 | Disposition: A | Discharge status: Home / Self Care | Payer: Medicare HMO | Source: Ambulatory Visit | Attending: Internal Medicine | Admitting: Internal Medicine

## 2014-07-12 ENCOUNTER — Encounter: Payer: Self-pay | Admitting: Family Medicine

## 2014-07-12 ENCOUNTER — Encounter: Payer: Self-pay | Admitting: Internal Medicine

## 2014-07-12 VITALS — BP 194/96 | HR 71 | Temp 97.9°F | Ht 63.0 in | Wt 253.8 lb

## 2014-07-12 VITALS — BP 177/89 | HR 83 | Ht 63.0 in | Wt 253.0 lb

## 2014-07-12 DIAGNOSIS — M25571 Pain in right ankle and joints of right foot: Secondary | ICD-10-CM | POA: Insufficient documentation

## 2014-07-12 DIAGNOSIS — I10 Essential (primary) hypertension: Secondary | ICD-10-CM

## 2014-07-12 DIAGNOSIS — M7989 Other specified soft tissue disorders: Secondary | ICD-10-CM | POA: Insufficient documentation

## 2014-07-12 MED ORDER — LEVOTHYROXINE SODIUM 50 MCG PO TABS: ORAL_TABLET | ORAL | Status: DC

## 2014-07-12 MED ORDER — DICLOFENAC SODIUM 1 % TD GEL: 4.0000 g | Freq: Four times a day (QID) | TRANSDERMAL | Status: DC

## 2014-07-12 MED ORDER — KETOROLAC TROMETHAMINE 30 MG/ML IM SOLN: 30.0000 mg | Freq: Once | INTRAMUSCULAR | Status: DC

## 2014-07-12 MED ORDER — LISINOPRIL 20 MG PO TABS: 20.0000 mg | ORAL_TABLET | Freq: Every day | ORAL | Status: DC

## 2014-07-12 MED ORDER — KETOROLAC TROMETHAMINE 30 MG/ML IJ SOLN
30.0000 mg | Freq: Once | INTRAMUSCULAR | Status: AC
  Administered 2014-07-12: 30 mg via INTRAMUSCULAR

## 2014-07-12 MED ORDER — CHLORTHALIDONE 50 MG PO TABS: 50.0000 mg | ORAL_TABLET | Freq: Every day | ORAL | Status: DC

## 2014-07-12 MED ORDER — METOPROLOL TARTRATE 50 MG PO TABS: 50.0000 mg | ORAL_TABLET | Freq: Two times a day (BID) | ORAL | Status: DC

## 2014-07-12 MED ORDER — ACETAMINOPHEN-CODEINE #3 300-30 MG PO TABS: 1.0000 | ORAL_TABLET | ORAL | Status: DC | PRN

## 2014-07-12 NOTE — Progress Notes (Signed)
   Subjective:    Patient ID: Nancy [name], female    DOB: 12/01/1955, 59 y.o.   MRN: [id number]  HPI Patient seen and examined. She presents for an acute visit for R ankle pain and swelling.   Per patient she has had progressive right ankle pain since Sunday and is unable to bear weight. She also notes R ankle and foot swelling. No redness or increased warmth. No fevers. Patient states that she gets pain in the ankles when it is cold and damp and denies history of gout or trauma.  She was also noted to have elevated BP but states she is compliant with her meds. Denies CP. No HA, no focal weakness.  Review of Systems  Constitutional: Negative.   HENT: Negative.   Eyes: Negative.   Respiratory: Negative.   Cardiovascular: Negative.   Gastrointestinal: Negative.   Musculoskeletal: Positive for back pain, joint swelling and arthralgias.  Skin: Positive for color change.  Neurological: Negative.   Psychiatric/Behavioral: Negative.        Objective:   Physical Exam  Constitutional: She is oriented to person, place, and time. She appears well-developed and well-nourished.  HENT:  Head: Normocephalic and atraumatic.  Eyes: Conjunctivae are normal.  Neck: Normal range of motion.  Cardiovascular: Normal rate, regular rhythm and normal heart sounds.   Pulmonary/Chest: Effort normal and breath sounds normal. No respiratory distress. She has no wheezes.  Abdominal: Soft. Bowel sounds are normal. She exhibits no distension. There is no tenderness.  Musculoskeletal:  R ankle/foot swelling, tenderness to palpation over lateral aspect of R ankle. No erythema or increased local warmth  Neurological: She is alert and oriented to person, place, and time.  Skin: Skin is warm and dry.  Psychiatric: She has a normal mood and affect. Her behavior is normal.          Assessment & Plan:  Please see problem based charting for assessment and plan:

## 2014-07-12 NOTE — Patient Instructions (Signed)
- It was a pleasure seeing you today - It is likely that the pain in your ankle is caused by arthritis but it is possibly a sprain or a gout flare (unlikely) - We will stop the meloxicam and start you on voltaren gel - We will get an X ray of your right ankle and refer you to sports medicine for follow up - I will refill your pain medications today and give you an injection for the pain (toradol) - Please follow up in 1 week  Ankle Pain Ankle pain is a common symptom. The bones, cartilage, tendons, and muscles of the ankle joint perform a lot of work each day. The ankle joint holds your body weight and allows you to move around. Ankle pain can occur on either side or back of 1 or both ankles. Ankle pain may be sharp and burning or dull and aching. There may be tenderness, stiffness, redness, or warmth around the ankle. The pain occurs more often when a person walks or puts pressure on the ankle. CAUSES  There are many reasons ankle pain can develop. It is important to work with your caregiver to identify the cause since many conditions can impact the bones, cartilage, muscles, and tendons. Causes for ankle pain include:  Injury, including a break (fracture), sprain, or strain often due to a fall, sports, or a high-impact activity.  Swelling (inflammation) of a tendon (tendonitis).  Achilles tendon rupture.  Ankle instability after repeated sprains and strains.  Poor foot alignment.  Pressure on a nerve (tarsal tunnel syndrome).  Arthritis in the ankle or the lining of the ankle.  Crystal formation in the ankle (gout or pseudogout). DIAGNOSIS  A diagnosis is based on your medical history, your symptoms, results of your physical exam, and results of diagnostic tests. Diagnostic tests may include X-ray exams or a computerized magnetic scan (magnetic resonance imaging, MRI). TREATMENT  Treatment will depend on the cause of your ankle pain and may include:  Keeping pressure off the ankle  and limiting activities.  Using crutches or other walking support (a cane or brace).  Using rest, ice, compression, and elevation.  Participating in physical therapy or home exercises.  Wearing shoe inserts or special shoes.  Losing weight.  Taking medications to reduce pain or swelling or receiving an injection.  Undergoing surgery. HOME CARE INSTRUCTIONS   Only take over-the-counter or prescription medicines for pain, discomfort, or fever as directed by your caregiver.  Put ice on the injured area.  Put ice in a plastic bag.  Place a towel between your skin and the bag.  Leave the ice on for 15-20 minutes at a time, 03-04 times a day.  Keep your leg raised (elevated) when possible to lessen swelling.  Avoid activities that cause ankle pain.  Follow specific exercises as directed by your caregiver.  Record how often you have ankle pain, the location of the pain, and what it feels like. This information may be helpful to you and your caregiver.  Ask your caregiver about returning to work or sports and whether you should drive.  Follow up with your caregiver for further examination, therapy, or testing as directed. SEEK MEDICAL CARE IF:   Pain or swelling continues or worsens beyond 1 week.  You have an oral temperature above 102 F (38.9 C).  You are feeling unwell or have chills.  You are having an increasingly difficult time with walking.  You have loss of sensation or other new symptoms.  You  have questions or concerns. MAKE SURE YOU:   Understand these instructions.  Will watch your condition.  Will get help right away if you are not doing well or get worse. Document Released: 08/13/2009 Document Revised: 05/18/2011 Document Reviewed: 08/13/2009 [facility] Patient Information 2015 [employer] [place]. This information is not intended to replace advice given to you by your health care provider. Make sure you discuss any questions you have with your health care  provider.

## 2014-07-12 NOTE — Assessment & Plan Note (Signed)
BP Readings from Last 3 Encounters:  07/12/14 177/89  07/12/14 194/96  04/13/14 150/79    Lab Results  Component Value Date   NA 138 02/19/2014   K 4.0 02/19/2014   CREATININE 0.89 02/19/2014    Assessment: Blood pressure control:  poor Progress toward BP goal:   deteriorated Comments: patient attributes increased BP secondary to pain. She is compliant with her meds   Plan: Medications:  continue current medications Educational resources provided: brochure (denies) Self management tools provided:   Other plans: Will follow up in 1 week after pain is controlled

## 2014-07-12 NOTE — Patient Instructions (Signed)
Your history and exam are consistent with an acute synovitis of the ankle, less likely acute gout or arthritis not visible on the x-rays. Over the counter Ibuprofen 600mg  three times a day with food for pain and inflammation - I'd recommend taking this regularly for about 7 days then as needed. Icing 15 minutes at a time 3-4 times a day. Consider the boot to help with stability, pain for about 2 weeks. Call me if you want to try prednisone dose pack - we can call this in if you do. Follow up with me in 4 weeks or as needed though definitely call me if you're struggling.

## 2014-07-12 NOTE — Assessment & Plan Note (Signed)
-   Patient with worsening right ankle pain * 4 days with inability to bear weight and associated right ankle swelling - No erythema or increased local warmth - Refilled tylenol 3 today and started voltaren gel - Patient was seen by sports medicine today- likely acute synovitis - Advised to take ibuprofen 600 mg TID, ice as needed and to wear a boot  - Will f/u with sports med in 4 weeks or sooner if persistent pain

## 2014-07-16 NOTE — Progress Notes (Signed)
PCP: Earl LagosNARENDRA, NISCHAL, MD  Subjective:   HPI: Patient is a 59 y.o. female here for right ankle pain.  Patient denies known injury or trauma. She states on 5/1 she started to get aching on lateral aspect of right ankle. Progressed since that time now to 7/10 level of pain. Difficulty bearing weight. Toradol injection given and helped some. Tried muscle relaxant, pain medication, ace wrap, elevation. Not icing. Worse with the rain.  Radiographs without evidence of DJD, anything acute.  Past Medical History  Diagnosis Date  . Primary Sjogren's syndrome     anti Ro+;ANA>1:1280 in homogen pattern, negative ds DNA/RF/anti Smith/RNP/C3-4 comp/la/jo1/Scleroderma/centromere, neg HIV/ACE/Hep B/C, nl CXR 2/05, schirmer salivary glandtest not done, symptom rx:eye drops/prednisone/plaquenil [facility], Dr. Rushie NyhanBourne/o'Rourke 3/07. saw [name] but stopped 2/2 cost.  . Hypertension     no LVH EKG-7/05, nl M/C ratio 4/07  . Morbid obesity   . Back pain     chronic low back pain L4-5 discectomy:11/99. degenerative thoracic spondylotic changes 4/07  . Obstructive sleep apnea     severe 7/05 RD 161 per hr. /CPAP 18cwp  . Hypothyroidism     09/1998  . Microcytic anemia     09/1998. Hb-10.5/MCV 76. needs ferritin to determine ACD vs IDA  . Menorrhagia   . Uterine bleeding   . Lower extremity edema     echo EF 55-65% w/o evidence of Dias dysfx.,   . Polyarthralgia     (knee/back/ankle) w/dx of fibromyalgia? [facility], knee pain chronic 2/2 obesity, fibromyalgia and Sjogren's.  . Chest pain     neg adenosine myoview.    Current Outpatient Prescriptions on File Prior to Visit  Medication Sig Dispense Refill  . acetaminophen-codeine (TYLENOL #3) 300-30 MG per tablet Take 1 tablet by mouth every 4 (four) hours as needed for severe pain. 20 tablet 0  . Alum & Mag Hydroxide-Simeth (GI COCKTAIL) SUSP suspension Take 10 mLs by mouth 2 (two) times daily as needed for  indigestion. Shake well. 100 mL 0  . chlorthalidone (HYGROTON) 50 MG tablet Take 1 tablet (50 mg total) by mouth daily. 30 tablet 2  . diclofenac sodium (VOLTAREN) 1 % GEL Apply 4 g topically 4 (four) times daily. 100 g 3  . docusate sodium (COLACE) 100 MG capsule Take 1 capsule (100 mg total) by mouth 2 (two) times daily as needed for constipation. 30 capsule 2  . ferrous sulfate 325 (65 FE) MG tablet Take 1 tablet (325 mg total) by mouth 3 (three) times daily with meals. 90 tablet 2  . fluticasone (FLONASE) 50 MCG/ACT nasal spray PLACE 1 SPRAY INTO THE NOSE EVERY MORNING. 16 g 1  . guaiFENesin (MUCINEX) 600 MG 12 hr tablet Take 1 tablet (600 mg total) by mouth 2 (two) times daily. 30 tablet 0  . levothyroxine (SYNTHROID, LEVOTHROID) 50 MCG tablet TAKE 1 TABLET BY MOUTH EVERY DAY 90 tablet 1  . lisinopril (PRINIVIL,ZESTRIL) 20 MG tablet Take 1 tablet (20 mg total) by mouth daily. 30 tablet 3  . megestrol (MEGACE) 40 MG tablet Take 2 tablets (80 mg total) by mouth daily. 60 tablet 5  . metoprolol (LOPRESSOR) 50 MG tablet Take 1 tablet (50 mg total) by mouth 2 (two) times daily. 60 tablet 2  . omeprazole (PRILOSEC) 40 MG capsule Take 1 capsule (40 mg total) by mouth 2 (two) times daily. 30 capsule 1  . ondansetron (ZOFRAN) 4 MG tablet Take 1 tablet (4 mg total) by mouth every 8 (  eight) hours as needed for nausea or vomiting. 20 tablet 0  . PROAIR HFA 108 (90 BASE) MCG/ACT inhaler INHALE 1 PUFF INTO THE LUNGS EVERY 6 (SIX) HOURS AS NEEDED FOR WHEEZING. 8.5 each 2  . traMADol (ULTRAM) 50 MG tablet Take 1 tablet (50 mg total) by mouth 3 (three) times daily. 30 tablet 0   No current facility-administered medications on file prior to visit.    Past Surgical History  Procedure Laterality Date  . Tonsillectomy    . Lumbar fusion  1999  . Shoulder surgery  unk  . Hysteroscopy w/d&c  11/23/2011    Procedure: DILATATION AND CURETTAGE /HYSTEROSCOPY;  Surgeon: Tereso NewcomerUgonna A Anyanwu, MD;  Location: [facility];   Service: Gynecology;  Laterality: N/A;  . Examination under anesthesia  11/23/2011    Procedure: EXAM UNDER ANESTHESIA;  Surgeon: Tereso NewcomerUgonna A Anyanwu, MD;  Location: [facility];  Service: Gynecology;  Laterality: N/A;  . Tubal ligation  1977  . Wisdom tooth extraction      Allergies  Allergen Reactions  . Amlodipine     Lower extremity swelling.  This reaction only occurred when taking GENERIC amlodipine.  She previously tolerated brand name Norvasc well.  . Coconut Fatty Acids     Break out  . Naproxen Sodium Nausea And Vomiting    Says she can take ibuprofen     History   Social History  . Marital Status: Married    Spouse Name: N/A  . Number of Children: N/A  . Years of Education: N/A   Occupational History  . house wife    Social History Main Topics  . Smoking status: Never Smoker   . Smokeless tobacco: Never Used  . Alcohol Use: No  . Drug Use: No  . Sexual Activity:    Partners: Male    Birth Control/ Protection: Post-menopausal   Other Topics Concern  . Not on file   Social History Narrative   Her daughter [name](keisha) often comes with her to visits. She also has a grand daughter and great grand daughter(whose name is [place])    Family History  Problem Relation Age of Onset  . Hypertension Mother   . Cancer Mother   . Cancer Father   . Cancer Sister   . Ovarian cancer      family history  . Breast cancer      family history in 1st degree relatives.  . Thyroid disease Sister   . Colon cancer Neg Hx   . Stomach cancer Neg Hx     BP 177/89 mmHg  Pulse 83  Ht 5\' 3"  (1.6 m)  Wt 253 lb (114.76 kg)  BMI 44.83 kg/m2  Review of Systems: See HPI above.    Objective:  Physical Exam:  Gen: NAD  Right ankle: Lateral swelling.  No bruising, warmth, erythema, other deformity. FROM with pain all motions. TTP anterior ankle joint, over ATFL. Trace ant drawer and talar tilt.   Negative syndesmotic compression. Thompsons test negative. NV intact distally.     Assessment & Plan:  1. Right ankle pain - radiographs negative for fracture, arthritis of the ankle.  Presentation suggests an inflammatory synovitis, possibly an acute gout flare.  Discussed options - she would like to try regular ibuprofen, icing.  Cam walker - can bear weight when tolerated.  F/u in 4 weeks - call us if she wants to try prednisone.

## 2014-07-16 NOTE — Assessment & Plan Note (Signed)
radiographs negative for fracture, arthritis of the ankle.  Presentation suggests an inflammatory synovitis, possibly an acute gout flare.  Discussed options - she would like to try regular ibuprofen, icing.  Cam walker - can bear weight when tolerated.  F/u in 4 weeks - call us if she wants to try prednisone.

## 2014-07-19 ENCOUNTER — Ambulatory Visit (INDEPENDENT_AMBULATORY_CARE_PROVIDER_SITE_OTHER): Payer: Medicare HMO | Admitting: Internal Medicine

## 2014-07-19 ENCOUNTER — Encounter: Payer: Self-pay | Admitting: Internal Medicine

## 2014-07-19 VITALS — BP 164/80 | HR 57 | Temp 98.2°F | Ht 63.0 in | Wt 253.0 lb

## 2014-07-19 DIAGNOSIS — I10 Essential (primary) hypertension: Secondary | ICD-10-CM

## 2014-07-19 NOTE — Progress Notes (Signed)
Medicine attending: Medical history, presenting problems, physical findings, and medications, reviewed with Dr Nora Sadek and I concur with her evaluation and management plan. 

## 2014-07-19 NOTE — Patient Instructions (Signed)
General Instructions:   Please bring your medicines with you each time you come to clinic.  Medicines may include prescription medications, over-the-counter medications, herbal remedies, eye drops, vitamins, or other pills.   Hope your son feels better! Good luck and glad you are doing better.    Progress Toward Treatment Goals:  Treatment Goal 04/13/2014  Blood pressure improved    Self Care Goals & Plans:  Self Care Goal 07/19/2014  Manage my medications take my medicines as prescribed; bring my medications to every visit; refill my medications on time  Monitor my health -  Eat healthy foods drink diet soda or water instead of juice or soda; eat more vegetables; eat foods that are low in salt; eat baked foods instead of fried foods; eat fruit for snacks and desserts  Be physically active -  Meeting treatment goals -    No flowsheet data found.   Care Management & Community Referrals:  Referral 03/29/2014  Referrals made for care management support none needed

## 2014-07-19 NOTE — Progress Notes (Signed)
Subjective:   Patient ID: Nancy ChristiansLinda S [name] female   DOB: 05/02/1955 59 y.o.   MRN: [id number]  HPI: [name] is a 59 y.o. woman pmh as listed below presents for HTN follow up.    Hypertension ROS: taking medications as instructed, no medication side effects noted, no TIA's, no chest pain on exertion, no dyspnea on exertion and no swelling of ankles.  New concerns: pt son was just hospitalized after being struck in a motorcycle accident. Pt has been in the ICU and that has contributed to significant stress. The patient was also seen at sports medicine for her right ankle pain that has dramatically improved the patient is still taking NSAIDs for pain relief.   Past Medical History  Diagnosis Date  . Primary Sjogren's syndrome     anti Ro+;ANA>1:1280 in homogen pattern, negative ds DNA/RF/anti Smith/RNP/C3-4 comp/la/jo1/Scleroderma/centromere, neg HIV/ACE/Hep B/C, nl CXR 2/05, schirmer salivary glandtest not done, symptom rx:eye drops/prednisone/plaquenil [facility], Dr. Rushie NyhanBourne/o'Rourke 3/07. saw [name] but stopped 2/2 cost.  . Hypertension     no LVH EKG-7/05, nl M/C ratio 4/07  . Morbid obesity   . Back pain     chronic low back pain L4-5 discectomy:11/99. degenerative thoracic spondylotic changes 4/07  . Obstructive sleep apnea     severe 7/05 RD 161 per hr. /CPAP 18cwp  . Hypothyroidism     09/1998  . Microcytic anemia     09/1998. Hb-10.5/MCV 76. needs ferritin to determine ACD vs IDA  . Menorrhagia   . Uterine bleeding   . Lower extremity edema     echo EF 55-65% w/o evidence of Dias dysfx.,   . Polyarthralgia     (knee/back/ankle) w/dx of fibromyalgia? [facility], knee pain chronic 2/2 obesity, fibromyalgia and Sjogren's.  . Chest pain     neg adenosine myoview.   Current Outpatient Prescriptions  Medication Sig Dispense Refill  . acetaminophen-codeine (TYLENOL #3) 300-30 MG per tablet Take 1 tablet by mouth every 4 (four) hours as needed  for severe pain. 20 tablet 0  . Alum & Mag Hydroxide-Simeth (GI COCKTAIL) SUSP suspension Take 10 mLs by mouth 2 (two) times daily as needed for indigestion. Shake well. 100 mL 0  . chlorthalidone (HYGROTON) 50 MG tablet Take 1 tablet (50 mg total) by mouth daily. 30 tablet 2  . diclofenac sodium (VOLTAREN) 1 % GEL Apply 4 g topically 4 (four) times daily. 100 g 3  . docusate sodium (COLACE) 100 MG capsule Take 1 capsule (100 mg total) by mouth 2 (two) times daily as needed for constipation. 30 capsule 2  . ferrous sulfate 325 (65 FE) MG tablet Take 1 tablet (325 mg total) by mouth 3 (three) times daily with meals. 90 tablet 2  . fluticasone (FLONASE) 50 MCG/ACT nasal spray PLACE 1 SPRAY INTO THE NOSE EVERY MORNING. 16 g 1  . guaiFENesin (MUCINEX) 600 MG 12 hr tablet Take 1 tablet (600 mg total) by mouth 2 (two) times daily. 30 tablet 0  . levothyroxine (SYNTHROID, LEVOTHROID) 50 MCG tablet TAKE 1 TABLET BY MOUTH EVERY DAY 90 tablet 1  . lisinopril (PRINIVIL,ZESTRIL) 20 MG tablet Take 1 tablet (20 mg total) by mouth daily. 30 tablet 3  . megestrol (MEGACE) 40 MG tablet Take 2 tablets (80 mg total) by mouth daily. 60 tablet 5  . metoprolol (LOPRESSOR) 50 MG tablet Take 1 tablet (50 mg total) by mouth 2 (two) times daily. 60 tablet 2  . omeprazole (PRILOSEC)  40 MG capsule Take 1 capsule (40 mg total) by mouth 2 (two) times daily. 30 capsule 1  . ondansetron (ZOFRAN) 4 MG tablet Take 1 tablet (4 mg total) by mouth every 8 (eight) hours as needed for nausea or vomiting. 20 tablet 0  . PROAIR HFA 108 (90 BASE) MCG/ACT inhaler INHALE 1 PUFF INTO THE LUNGS EVERY 6 (SIX) HOURS AS NEEDED FOR WHEEZING. 8.5 each 2  . traMADol (ULTRAM) 50 MG tablet Take 1 tablet (50 mg total) by mouth 3 (three) times daily. 30 tablet 0   No current facility-administered medications for this visit.   Family History  Problem Relation Age of Onset  . Hypertension Mother   . Cancer Mother   . Cancer Father   . Cancer Sister    . Ovarian cancer      family history  . Breast cancer      family history in 1st degree relatives.  . Thyroid disease Sister   . Colon cancer Neg Hx   . Stomach cancer Neg Hx    History   Social History  . Marital Status: Married    Spouse Name: N/A  . Number of Children: N/A  . Years of Education: N/A   Occupational History  . house wife    Social History Main Topics  . Smoking status: Never Smoker   . Smokeless tobacco: Never Used  . Alcohol Use: No  . Drug Use: No  . Sexual Activity:    Partners: Male    Birth Control/ Protection: Post-menopausal   Other Topics Concern  . None   Social History Narrative   Her daughter Nancy Artis(keisha) often comes with her to visits. She also has a grand daughter and great grand daughter(whose name is Nancy [name])   Review of Systems: Pertinent items are noted in HPI. Objective:  Physical Exam: Filed Vitals:   07/19/14 1041  BP: 164/80  Pulse: 57  Temp: 98.2 F (36.8 C)  TempSrc: Oral  Height: 5\' 3"  (1.6 m)  Weight: 253 lb (114.76 kg)  SpO2: 100%   General: sitting in chair, NAD Cardiac: RRR, no rubs, murmurs or gallops Pulm: clear to auscultation bilaterally, moving normal volumes of air Abd: soft, nontender, nondistended, BS present Ext: warm and well perfused, no pedal edema  Assessment & Plan:  Please see problem oriented charting  Pt discussed with Dr. Cyndie ChimeGranfortuna

## 2014-07-19 NOTE — Assessment & Plan Note (Signed)
BP Readings from Last 3 Encounters:  07/19/14 164/80  07/12/14 177/89  07/12/14 194/96    Lab Results  Component Value Date   NA 138 02/19/2014   K 4.0 02/19/2014   CREATININE 0.89 02/19/2014    Assessment: Blood pressure control:   Progress toward BP goal:    Comments: pt having much better control likely exacerbated by NSAID use and recent stress  Plan: Medications:  continue current medications Educational resources provided: brochure (denies) Self management tools provided:   Other plans: pt was reluctant to start any new medications and would like to follow up after her ankle pain has resolved and her son is doing better  F/u in [id number]

## 2014-10-10 ENCOUNTER — Encounter: Payer: Self-pay | Admitting: Internal Medicine

## 2014-10-10 ENCOUNTER — Ambulatory Visit (INDEPENDENT_AMBULATORY_CARE_PROVIDER_SITE_OTHER): Payer: Medicare HMO | Admitting: Internal Medicine

## 2014-10-10 VITALS — BP 187/87 | HR 75 | Temp 98.2°F | Ht 60.0 in | Wt 253.8 lb

## 2014-10-10 DIAGNOSIS — I10 Essential (primary) hypertension: Secondary | ICD-10-CM

## 2014-10-10 DIAGNOSIS — K029 Dental caries, unspecified: Secondary | ICD-10-CM

## 2014-10-10 DIAGNOSIS — M545 Low back pain, unspecified: Secondary | ICD-10-CM

## 2014-10-10 DIAGNOSIS — G8929 Other chronic pain: Secondary | ICD-10-CM | POA: Diagnosis not present

## 2014-10-10 MED ORDER — ACETAMINOPHEN-CODEINE #3 300-30 MG PO TABS: 1.0000 | ORAL_TABLET | ORAL | Status: DC | PRN

## 2014-10-10 MED ORDER — LISINOPRIL 20 MG PO TABS: 20.0000 mg | ORAL_TABLET | Freq: Every day | ORAL | Status: DC

## 2014-10-10 NOTE — Patient Instructions (Signed)
Thank you for your visit today.  I refilled your blood pressure medication. I will speak with Dr. Heide Spark regarding your pain medication. We will give you a list of a dentist that you may go to for your care.    Please be sure to bring all of your medications with you to every visit; this includes herbal supplements, vitamins, eye drops, and any over-the-counter medications.   Should you have any questions regarding your medications and/or any new or worsening symptoms, please be sure to call the clinic at [phone].   If you believe that you are suffering from a life threatening condition or one that may result in the loss of limb or function, then you should call 911 or proceed to the nearest Emergency Department.   A healthy lifestyle and preventative care can promote health and wellness.   Maintain regular health, dental, and eye exams.  Eat a healthy diet. Foods like vegetables, fruits, whole grains, low-fat dairy products, and lean protein foods contain the nutrients you need without too many calories. Decrease your intake of foods high in solid fats, added sugars, and salt. Get information about a proper diet from your caregiver, if necessary.  Regular physical exercise is one of the most important things you can do for your health. Most adults should get at least 150 minutes of moderate-intensity exercise (any activity that increases your heart rate and causes you to sweat) each week. In addition, most adults need muscle-strengthening exercises on 2 or more days a week.   Maintain a healthy weight. The body mass index (BMI) is a screening tool to identify possible weight problems. It provides an estimate of body fat based on height and weight. Your caregiver can help determine your BMI, and can help you achieve or maintain a healthy weight. For adults 20 years and older:  A BMI below 18.5 is considered underweight.  A BMI of 18.5 to 24.9 is normal.  A BMI of 25 to 29.9 is  considered overweight.  A BMI of 30 and above is considered obese.

## 2014-10-10 NOTE — Assessment & Plan Note (Signed)
-  refilled lisinopril, BP elevated today at 187/87 but reports being out of meds

## 2014-10-10 NOTE — Progress Notes (Signed)
Patient ID: Nancy [name], female   DOB: 05/21/1955, 59 y.o.MRN: [id number]     Subjective:   Patient ID: Nancy [name] female    DOB: [birth year] 59 y.o.    MRN: [id number] Health Maintenance Due: Health Maintenance Due  Topic Date Due  . HIV Screening  03/13/1968  . ZOSTAVAX  03/13/2013  . MAMMOGRAM  10/12/2013  . INFLUENZA VACCINE  10/08/2014    _________________________________________________  HPI: Nancy [name] is a 59 y.o. female here for an acute visit.  Pt has a PMH outlined below.  Please see problem-based charting assessment and plan note for further details of medical issues addressed at today's visit.  PMH: Past Medical History  Diagnosis Date  . Primary Sjogren's syndrome     anti Ro+;ANA>1:1280 in homogen pattern, negative ds DNA/RF/anti Smith/RNP/C3-4 comp/la/jo1/Scleroderma/centromere, neg HIV/ACE/Hep B/C, nl CXR 2/05, schirmer salivary glandtest not done, symptom rx:eye drops/prednisone/plaquenil referral to [facility] rheum, [name] 3/07. saw [name] but stopped 2/2 cost.  . Hypertension     no LVH EKG-7/05, nl M/C ratio 4/07  . Morbid obesity   . Back pain     chronic low back pain L4-5 discectomy:11/99. degenerative thoracic spondylotic changes 4/07  . Obstructive sleep apnea     severe 7/05 RD 161 per hr. /CPAP 18cwp  . Hypothyroidism     09/1998  . Microcytic anemia     09/1998. Hb-10.5/MCV 76. needs ferritin to determine ACD vs IDA  . Menorrhagia   . Uterine bleeding   . Lower extremity edema     echo EF 55-65% w/o evidence of Dias dysfx.,   . Polyarthralgia     (knee/back/ankle) w/dx of fibromyalgia? given by [facility] rheum, knee pain chronic 2/2 obesity, fibromyalgia and Sjogren's.  . Chest pain     neg adenosine myoview.    Medications: Current Outpatient Prescriptions on File Prior to Visit  Medication Sig Dispense Refill  . Alum & Mag Hydroxide-Simeth (GI COCKTAIL) SUSP suspension Take 10 mLs by mouth 2 (two) times daily as  needed for indigestion. Shake well. 100 mL 0  . chlorthalidone (HYGROTON) 50 MG tablet Take 1 tablet (50 mg total) by mouth daily. 30 tablet 2  . diclofenac sodium (VOLTAREN) 1 % GEL Apply 4 g topically 4 (four) times daily. 100 g 3  . docusate sodium (COLACE) 100 MG capsule Take 1 capsule (100 mg total) by mouth 2 (two) times daily as needed for constipation. 30 capsule 2  . ferrous sulfate 325 (65 FE) MG tablet Take 1 tablet (325 mg total) by mouth 3 (three) times daily with meals. 90 tablet 2  . fluticasone (FLONASE) 50 MCG/ACT nasal spray PLACE 1 SPRAY INTO THE NOSE EVERY MORNING. 16 g 1  . guaiFENesin (MUCINEX) 600 MG 12 hr tablet Take 1 tablet (600 mg total) by mouth 2 (two) times daily. 30 tablet 0  . levothyroxine (SYNTHROID, LEVOTHROID) 50 MCG tablet TAKE 1 TABLET BY MOUTH EVERY DAY 90 tablet 1  . megestrol (MEGACE) 40 MG tablet Take 2 tablets (80 mg total) by mouth daily. 60 tablet 5  . metoprolol (LOPRESSOR) 50 MG tablet Take 1 tablet (50 mg total) by mouth 2 (two) times daily. 60 tablet 2  . omeprazole (PRILOSEC) 40 MG capsule Take 1 capsule (40 mg total) by mouth 2 (two) times daily. 30 capsule 1  . ondansetron (ZOFRAN) 4 MG tablet Take 1 tablet (4 mg total) by mouth every 8 (eight) hours as needed for nausea or vomiting. 20 tablet 0  .  PROAIR HFA 108 (90 BASE) MCG/ACT inhaler INHALE 1 PUFF INTO THE LUNGS EVERY 6 (SIX) HOURS AS NEEDED FOR WHEEZING. 8.5 each 2   No current facility-administered medications on file prior to visit.    Allergies: Allergies  Allergen Reactions  . Amlodipine     Lower extremity swelling.  This reaction only occurred when taking GENERIC amlodipine.  She previously tolerated brand name Norvasc well.  . Coconut Fatty Acids     Break out  . Naproxen Sodium Nausea And Vomiting    Says she can take ibuprofen     FH: Family History  Problem Relation Age of Onset  . Hypertension Mother   . Cancer Mother   . Cancer Father   . Cancer Sister   .  Ovarian cancer      family history  . Breast cancer      family history in 1st degree relatives.  . Thyroid disease Sister   . Colon cancer Neg Hx   . Stomach cancer Neg Hx     SH: History   Social History  . Marital Status: Married    Spouse Name: N/A  . Number of Children: N/A  . Years of Education: N/A   Occupational History  . house wife    Social History Main Topics  . Smoking status: Never Smoker   . Smokeless tobacco: Never Used  . Alcohol Use: No  . Drug Use: No  . Sexual Activity:    Partners: Male    Birth Control/ Protection: Post-menopausal   Other Topics Concern  . None   Social History Narrative   Her daughter [name]) often comes with her to visits. She also has a grand daughter and great grand daughter(whose name is Stark Bray)    Review of Systems: Constitutional: Negative for fever, chills and weight loss.  Eyes: Negative for blurred vision.  Respiratory: Negative for cough and shortness of breath.  Cardiovascular: Negative for chest pain, palpitations and leg swelling.  Gastrointestinal: Negative for nausea, vomiting, abdominal pain, diarrhea, constipation and blood in stool.  Genitourinary: Negative for dysuria, urgency and frequency.  Musculoskeletal: Negative for myalgias and +back pain.  Neurological: Negative for dizziness, weakness and headaches.     Objective:   Vital Signs: Filed Vitals:   10/10/14 0925  BP: 187/87  Pulse: 75  Temp: 98.2 F (36.8 C)  TempSrc: Oral  Height: 5' (1.524 m)  Weight: 253 lb 12.8 oz (115.123 kg)  SpO2: 100%      BP Readings from Last 3 Encounters:  10/10/14 187/87  07/19/14 164/80  07/12/14 177/89    Physical Exam: Constitutional: Vital signs reviewed.  Patient is in NAD and cooperative with exam.  Head: Normocephalic and atraumatic. Eyes: EOMI, conjunctivae nl, no scleral icterus.  Throat: Several dental caries noted in the top front teeth.  Several missing from the bottom also.   Neck:  Supple. Cardiovascular: RRR, no MRG. Pulmonary/Chest: normal effort, CTAB, no wheezes, rales, or rhonchi. Abdominal: Soft. NT/ND +BS. Neurological: A&O x3, cranial nerves II-XII are grossly intact, moving all extremities. Extremities: 2+DP b/l; no pitting edema. Skin: Warm, dry and intact. No rash.   Assessment & Plan:   Assessment and plan was discussed and formulated with my attending.

## 2014-10-11 DIAGNOSIS — K029 Dental caries, unspecified: Secondary | ICD-10-CM | POA: Insufficient documentation

## 2014-10-11 NOTE — Assessment & Plan Note (Signed)
Pt reports her top teeth hurt when she chews and they are "breaking off."  Denies any fever/chills.  On exam, she has several dental caries in her front top teeth and is missing several bottom back teeth.  Overall, poor dental hygiene.  I did not appreciate any abscess or lymphadenopathy.   -referral to dentistry (would like oral surgeon but I explained that she would have to be referred to an oral surgeon by dentistry first) -refilled pain meds per authorization by PCP

## 2014-10-11 NOTE — Assessment & Plan Note (Signed)
Pt c/o LBP today and requests a refill of her pain medication.  I spoke with Dr. Heide Spark who is her PCP and is agrees to refill her tylenol#3. -refilled tylenol #3 per pt request and agreement with PCP  -f/u with PCP

## 2014-10-12 NOTE — Progress Notes (Signed)
Internal Medicine Clinic Attending  Case discussed with Dr. Gill soon after the resident saw the patient.  We reviewed the resident's history and exam and pertinent patient test results.  I agree with the assessment, diagnosis, and plan of care documented in the resident's note.  

## 2014-10-26 ENCOUNTER — Telehealth: Payer: Self-pay | Admitting: Student-PharmD

## 2014-10-26 DIAGNOSIS — I10 Essential (primary) hypertension: Secondary | ICD-10-CM

## 2014-10-26 NOTE — Telephone Encounter (Signed)
Patient not available at this time.  Will call back today, when she is home.

## 2014-10-29 NOTE — Telephone Encounter (Signed)
Called patient, not available at 9:00 am and no answer at 11:30 am  Called to discuss blood pressure medications and adherence.   Called her listed pharmacy for refill history and received the following information: 1. Chlorthalidone 50 mg  -  Last Filled on 04/26/14 2. Lisinopril 20 mg - Last Filled on 10/10/14 3. Metroprolol 50 mg - Last Filled on 09/11/14

## 2014-11-01 NOTE — Telephone Encounter (Addendum)
S:    Nancy [name] is a 59 y.o. female was contacted for help with HTN medication management  HTN medications Current: lisinopril 20 mg daily, metoprolol tartrate 50 mg BID. Of note, patient last filled chlorthalidone 50 mg/day on 04/26/14.   Allergies  Allergen Reactions  . Amlodipine     Lower extremity swelling.  This reaction only occurred when taking GENERIC amlodipine.  She previously tolerated brand name Norvasc well.  . Coconut Fatty Acids     Break out  . Naproxen Sodium Nausea And Vomiting    Says she can take ibuprofen    O:  BP Readings from Last 3 Encounters:  10/10/14 187/87  07/19/14 164/80  07/12/14 177/89   Pertinent labs:    Component Value Date/Time   NA 138 02/19/2014 1000   K 4.0 02/19/2014 1000   CL 101 02/19/2014 1000   CO2 26 02/19/2014 1000   GLUCOSE 95 02/19/2014 1000   BUN 11 02/19/2014 1000   CREATININE 0.89 02/19/2014 1000   CREATININE 0.90 11/16/2011 1040   CALCIUM 9.2 02/19/2014 1000   GFRAA 81 02/19/2014 1000   GFRAA 80* 11/16/2011 1040   Changes/recommendations discussed with PCP: switch to lisinopril/HCTZ + metoprolol. Would be able to increase lisinopril/HCTZ to 2 tablets daily (40/25 mg daily) in the future if further BP control needed. Also switching from metoprolol tartrate to succinate therapy.  Kim,Jennifer J

## 2014-11-02 MED ORDER — METOPROLOL SUCCINATE ER 100 MG PO TB24: 100.0000 mg | ORAL_TABLET | Freq: Every day | ORAL | Status: DC

## 2014-11-02 MED ORDER — LISINOPRIL-HYDROCHLOROTHIAZIDE 20-12.5 MG PO TABS: 1.0000 | ORAL_TABLET | Freq: Every day | ORAL | Status: DC

## 2014-11-14 ENCOUNTER — Emergency Department (HOSPITAL_COMMUNITY): Payer: Medicare HMO

## 2014-11-14 ENCOUNTER — Encounter (HOSPITAL_COMMUNITY): Payer: Self-pay | Admitting: Family Medicine

## 2014-11-14 ENCOUNTER — Emergency Department (HOSPITAL_COMMUNITY)
Admission: EM | Admit: 2014-11-14 | Discharge: 2014-11-15 | Disposition: A | Discharge status: Home / Self Care | Payer: Medicare HMO | Attending: Emergency Medicine | Admitting: Emergency Medicine

## 2014-11-14 DIAGNOSIS — Z8742 Personal history of other diseases of the female genital tract: Secondary | ICD-10-CM | POA: Diagnosis not present

## 2014-11-14 DIAGNOSIS — R011 Cardiac murmur, unspecified: Secondary | ICD-10-CM | POA: Diagnosis not present

## 2014-11-14 DIAGNOSIS — J029 Acute pharyngitis, unspecified: Secondary | ICD-10-CM | POA: Insufficient documentation

## 2014-11-14 DIAGNOSIS — E039 Hypothyroidism, unspecified: Secondary | ICD-10-CM | POA: Diagnosis not present

## 2014-11-14 DIAGNOSIS — R51 Headache: Secondary | ICD-10-CM | POA: Diagnosis not present

## 2014-11-14 DIAGNOSIS — Z79899 Other long term (current) drug therapy: Secondary | ICD-10-CM | POA: Insufficient documentation

## 2014-11-14 DIAGNOSIS — Z7951 Long term (current) use of inhaled steroids: Secondary | ICD-10-CM | POA: Diagnosis not present

## 2014-11-14 DIAGNOSIS — R6889 Other general symptoms and signs: Secondary | ICD-10-CM

## 2014-11-14 DIAGNOSIS — R5383 Other fatigue: Secondary | ICD-10-CM | POA: Diagnosis not present

## 2014-11-14 DIAGNOSIS — D509 Iron deficiency anemia, unspecified: Secondary | ICD-10-CM | POA: Insufficient documentation

## 2014-11-14 DIAGNOSIS — I10 Essential (primary) hypertension: Secondary | ICD-10-CM | POA: Insufficient documentation

## 2014-11-14 DIAGNOSIS — G4733 Obstructive sleep apnea (adult) (pediatric): Secondary | ICD-10-CM | POA: Diagnosis not present

## 2014-11-14 DIAGNOSIS — R0981 Nasal congestion: Secondary | ICD-10-CM | POA: Diagnosis not present

## 2014-11-14 DIAGNOSIS — Z9981 Dependence on supplemental oxygen: Secondary | ICD-10-CM | POA: Insufficient documentation

## 2014-11-14 DIAGNOSIS — J3489 Other specified disorders of nose and nasal sinuses: Secondary | ICD-10-CM | POA: Diagnosis not present

## 2014-11-14 DIAGNOSIS — Z791 Long term (current) use of non-steroidal anti-inflammatories (NSAID): Secondary | ICD-10-CM | POA: Diagnosis not present

## 2014-11-14 DIAGNOSIS — R05 Cough: Secondary | ICD-10-CM | POA: Insufficient documentation

## 2014-11-14 DIAGNOSIS — R1084 Generalized abdominal pain: Secondary | ICD-10-CM | POA: Diagnosis present

## 2014-11-14 DIAGNOSIS — K529 Noninfective gastroenteritis and colitis, unspecified: Secondary | ICD-10-CM | POA: Diagnosis not present

## 2014-11-14 LAB — CBC
HCT: 33.1 % — ABNORMAL LOW (ref 36.0–46.0)
Hemoglobin: 10.5 g/dL — ABNORMAL LOW (ref 12.0–15.0)
MCH: 23.9 pg — ABNORMAL LOW (ref 26.0–34.0)
MCHC: 31.7 g/dL (ref 30.0–36.0)
MCV: 75.2 fL — ABNORMAL LOW (ref 78.0–100.0)
Platelets: 168 10*3/uL (ref 150–400)
RBC: 4.4 MIL/uL (ref 3.87–5.11)
RDW: 15 % (ref 11.5–15.5)
WBC: 3.7 10*3/uL — ABNORMAL LOW (ref 4.0–10.5)

## 2014-11-14 LAB — COMPREHENSIVE METABOLIC PANEL WITH GFR
ALT: 10 U/L — ABNORMAL LOW (ref 14–54)
AST: 16 U/L (ref 15–41)
Albumin: 3.5 g/dL (ref 3.5–5.0)
Alkaline Phosphatase: 50 U/L (ref 38–126)
Anion gap: 7 (ref 5–15)
BUN: 8 mg/dL (ref 6–20)
CO2: 30 mmol/L (ref 22–32)
Calcium: 8.6 mg/dL — ABNORMAL LOW (ref 8.9–10.3)
Chloride: 100 mmol/L — ABNORMAL LOW (ref 101–111)
Creatinine, Ser: 1 mg/dL (ref 0.44–1.00)
GFR calc Af Amer: >60 mL/min
GFR calc non Af Amer: 60 mL/min — ABNORMAL LOW
Glucose, Bld: 99 mg/dL (ref 65–99)
Potassium: 3.8 mmol/L (ref 3.5–5.1)
Sodium: 137 mmol/L (ref 135–145)
Total Bilirubin: 1 mg/dL (ref 0.3–1.2)
Total Protein: 7.6 g/dL (ref 6.5–8.1)

## 2014-11-14 LAB — URINALYSIS, ROUTINE W REFLEX MICROSCOPIC
Bilirubin Urine: NEGATIVE
Glucose, UA: NEGATIVE mg/dL
Hgb urine dipstick: NEGATIVE
Ketones, ur: NEGATIVE mg/dL
Leukocytes, UA: NEGATIVE
Nitrite: NEGATIVE
Protein, ur: NEGATIVE mg/dL
Specific Gravity, Urine: 1.014 (ref 1.005–1.030)
Urobilinogen, UA: 1 mg/dL (ref 0.0–1.0)
pH: 6.5 (ref 5.0–8.0)

## 2014-11-14 LAB — LIPASE, BLOOD: Lipase: 33 U/L (ref 22–51)

## 2014-11-14 LAB — TSH: TSH: 8.177 u[IU]/mL — ABNORMAL HIGH (ref 0.350–4.500)

## 2014-11-14 MED ORDER — GI COCKTAIL ~~LOC~~
30.0000 mL | Freq: Once | ORAL | Status: AC
  Administered 2014-11-14: 30 mL via ORAL
  Filled 2014-11-14: qty 30

## 2014-11-14 MED ORDER — SODIUM CHLORIDE 0.9 % IV BOLUS (SEPSIS)
1000.0000 mL | Freq: Once | INTRAVENOUS | Status: AC
  Administered 2014-11-14: 1000 mL via INTRAVENOUS

## 2014-11-14 MED ORDER — ACETAMINOPHEN 325 MG PO TABS
650.0000 mg | ORAL_TABLET | Freq: Once | ORAL | Status: AC
  Administered 2014-11-14: 650 mg via ORAL
  Filled 2014-11-14: qty 2

## 2014-11-14 MED ORDER — ONDANSETRON HCL 4 MG/2ML IJ SOLN
4.0000 mg | Freq: Once | INTRAMUSCULAR | Status: AC
  Administered 2014-11-14: 4 mg via INTRAVENOUS
  Filled 2014-11-14: qty 2

## 2014-11-14 MED ORDER — ONDANSETRON 4 MG PO TBDP: 4.0000 mg | ORAL_TABLET | Freq: Three times a day (TID) | ORAL | Status: DC | PRN

## 2014-11-14 NOTE — ED Provider Notes (Signed)
CSN: [id number]     Arrival date & time 11/14/14  1744 History   First MD Initiated Contact with Patient 11/14/14 2058     Chief Complaint  Patient presents with  . Abdominal Pain  . Nausea   Patient is a 59 y.o. female presenting with general illness. The history is provided by the patient and a relative. No language interpreter was used.  Illness Location:  Generalized Quality:  HA, cough, body aches, nausea, diarrhea Severity:  Moderate Onset quality:  Gradual Timing:  Constant Progression:  Worsening Chronicity:  Recurrent Context:  PMHx of HTN, OSA, hypothyroidism, and Sjogren's presenting with abdominal pain, nausea, and diarrhea. Patient 8 wings and pizza from [employer] 3-4 days ago following which she began experiencing generalized abdominal pain associated with nausea and nonbloody watery stools. Associated symptoms include dysuria, urinary frequency, headache, body aches, dry cough, rhinorrhea, nasal congestion, and chills. Patient denies fever or neck pain. Associated symptoms: abdominal pain, congestion, cough, diarrhea (non-bloody, watery), fatigue, headaches, nausea, rhinorrhea, shortness of breath and sore throat   Associated symptoms: no fever, no vomiting and no wheezing     Past Medical History  Diagnosis Date  . Primary Sjogren's syndrome     anti Ro+;ANA>1:1280 in homogen pattern, negative ds DNA/RF/anti Smith/RNP/C3-4 comp/la/jo1/Scleroderma/centromere, neg HIV/ACE/Hep B/C, nl CXR 2/05, schirmer salivary glandtest not done, symptom rx:eye drops/prednisone/plaquenil referral to [facility] rheum, [name] 3/07. saw [name] but stopped 2/2 cost.  . Hypertension     no LVH EKG-7/05, nl M/C ratio 4/07  . Morbid obesity   . Back pain     chronic low back pain L4-5 discectomy:11/99. degenerative thoracic spondylotic changes 4/07  . Obstructive sleep apnea     severe 7/05 RD 161 per hr. /CPAP 18cwp  . Hypothyroidism     09/1998  . Microcytic anemia    09/1998. Hb-10.5/MCV 76. needs ferritin to determine ACD vs IDA  . Menorrhagia   . Uterine bleeding   . Lower extremity edema     echo EF 55-65% w/o evidence of Dias dysfx.,   . Polyarthralgia     (knee/back/ankle) w/dx of fibromyalgia? given by [facility] rheum, knee pain chronic 2/2 obesity, fibromyalgia and Sjogren's.  . Chest pain     neg adenosine myoview.   Past Surgical History  Procedure Laterality Date  . Tonsillectomy    . Lumbar fusion  1999  . Shoulder surgery  unk  . Hysteroscopy w/d&c  11/23/2011    Procedure: DILATATION AND CURETTAGE /HYSTEROSCOPY;  Surgeon: Tereso Newcomer, MD;  Location: [facility];  Service: Gynecology;  Laterality: N/A;  . Examination under anesthesia  11/23/2011    Procedure: EXAM UNDER ANESTHESIA;  Surgeon: Tereso Newcomer, MD;  Location: [facility];  Service: Gynecology;  Laterality: N/A;  . Tubal ligation  1977  . Wisdom tooth extraction     Family History  Problem Relation Age of Onset  . Hypertension Mother   . Cancer Mother   . Cancer Father   . Cancer Sister   . Ovarian cancer      family history  . Breast cancer      family history in 1st degree relatives.  . Thyroid disease Sister   . Colon cancer Neg Hx   . Stomach cancer Neg Hx    Social History  Substance Use Topics  . Smoking status: Never Smoker   . Smokeless tobacco: Never Used  . Alcohol Use: No   OB History    No data available  Review of Systems  Constitutional: Positive for chills, appetite change and fatigue. Negative for fever.  HENT: Positive for congestion, rhinorrhea and sore throat.   Respiratory: Positive for cough and shortness of breath. Negative for wheezing.   Gastrointestinal: Positive for nausea, abdominal pain and diarrhea (non-bloody, watery). Negative for vomiting, constipation, blood in stool and anal bleeding.  Neurological: Positive for headaches.  All other systems reviewed and are negative.   Allergies  Amlodipine; Coconut fatty acids; and  Naproxen sodium  Home Medications   Prior to Admission medications   Medication Sig Start Date End Date Taking? Authorizing Provider  acetaminophen-codeine (TYLENOL #3) 300-30 MG per tablet Take 1 tablet by mouth every 4 (four) hours as needed for severe pain. 10/10/14   Marrian Salvage, MD  Alum & Mag Hydroxide-Simeth (GI COCKTAIL) SUSP suspension Take 10 mLs by mouth 2 (two) times daily as needed for indigestion. Shake well. 03/20/14   Marrian Salvage, MD  diclofenac sodium (VOLTAREN) 1 % GEL Apply 4 g topically 4 (four) times daily. 07/12/14   Earl Lagos, MD  docusate sodium (COLACE) 100 MG capsule Take 1 capsule (100 mg total) by mouth 2 (two) times daily as needed for constipation. 11/23/11   Tereso Newcomer, MD  ferrous sulfate 325 (65 FE) MG tablet Take 1 tablet (325 mg total) by mouth 3 (three) times daily with meals. 05/24/13   Judie Bonus, MD  fluticasone [facility]) 50 MCG/ACT nasal spray PLACE 1 SPRAY INTO THE NOSE EVERY MORNING. 01/23/14   Yolanda Manges, DO  guaiFENesin (MUCINEX) 600 MG 12 hr tablet Take 1 tablet (600 mg total) by mouth 2 (two) times daily. 01/23/14 01/23/15  Yolanda Manges, DO  levothyroxine (SYNTHROID, LEVOTHROID) 50 MCG tablet TAKE 1 TABLET BY MOUTH EVERY DAY 07/12/14   Earl Lagos, MD  lisinopril-hydrochlorothiazide (PRINZIDE,ZESTORETIC) 20-12.5 MG per tablet Take 1 tablet by mouth daily. Please counsel patient: stop chlorthalidone and lisinopril. 11/02/14   Earl Lagos, MD  megestrol (MEGACE) 40 MG tablet Take 2 tablets (80 mg total) by mouth daily. 12/09/11   Tereso Newcomer, MD  metoprolol succinate (TOPROL-XL) 100 MG 24 hr tablet Take 1 tablet (100 mg total) by mouth daily. Take with or immediately following a meal. VOID RX'S ON FILE FOR METOPROLOL TARTRATE. 11/02/14   Earl Lagos, MD  omeprazole (PRILOSEC) 40 MG capsule Take 1 capsule (40 mg total) by mouth 2 (two) times daily. 04/13/14   Harold Barban, MD  ondansetron (ZOFRAN ODT) 4 MG  disintegrating tablet Take 1 tablet (4 mg total) by mouth every 8 (eight) hours as needed for nausea or vomiting. 11/14/14   Angelina Ok, MD  ondansetron (ZOFRAN) 4 MG tablet Take 1 tablet (4 mg total) by mouth every 8 (eight) hours as needed for nausea or vomiting. 02/19/14   Ejiroghene E Mariea Clonts, MD  PROAIR HFA 108 (90 BASE) MCG/ACT inhaler INHALE 1 PUFF INTO THE LUNGS EVERY 6 (SIX) HOURS AS NEEDED FOR WHEEZING. 12/04/13   Nischal Narendra, MD   BP 160/78 mmHg  Pulse 51  Temp(Src) 97.8 F (36.6 C) (Oral)  Resp 16  SpO2 95%   Physical Exam  Constitutional: She is oriented to person, place, and time. She appears well-developed and well-nourished. She appears distressed (2/2 nausea).  HENT:  Head: Normocephalic and atraumatic.  Eyes: Conjunctivae are normal. Pupils are equal, round, and reactive to light.  Neck: Normal range of motion. Neck supple.  Cardiovascular: Regular rhythm and intact distal pulses.   Murmur heard.  HR 50s to 60s (patient taking metoprolol noted to be this low previously)  Pulmonary/Chest: Effort normal and breath sounds normal.  Abdominal: Soft. Bowel sounds are normal. She exhibits no distension. There is tenderness. There is no rebound and no guarding.  Musculoskeletal: Normal range of motion.  Neurological: She is alert and oriented to person, place, and time.  Skin: She is not diaphoretic.    ED Course  Procedures   Labs Review Labs Reviewed  COMPREHENSIVE METABOLIC PANEL - Abnormal; Notable for the following:    Chloride 100 (*)    Calcium 8.6 (*)    ALT 10 (*)    GFR calc non Af Amer 60 (*)    All other components within normal limits  CBC - Abnormal; Notable for the following:    WBC 3.7 (*)    Hemoglobin 10.5 (*)    HCT 33.1 (*)    MCV 75.2 (*)    MCH 23.9 (*)    All other components within normal limits  URINALYSIS, ROUTINE W REFLEX MICROSCOPIC (NOT AT [facility]) - Abnormal; Notable for the following:    APPearance CLOUDY (*)    All other  components within normal limits  TSH - Abnormal; Notable for the following:    TSH 8.177 (*)    All other components within normal limits  LIPASE, BLOOD   Imaging Review Dg Chest 2 View  11/14/2014   CLINICAL DATA:  Chest and abdomen pain tonight. Shortness of breath and vomiting. Nausea.  EXAM: CHEST  2 VIEW  COMPARISON:  11/05/2011  FINDINGS: Elevation of the right hemidiaphragm. Normal heart size and pulmonary vascularity. No focal airspace disease or consolidation in the lungs. No blunting of costophrenic angles. No pneumothorax. Mediastinal contours appear intact. Mildly tortuous aorta. Degenerative changes in the spine and shoulders.  IMPRESSION: No active cardiopulmonary disease.   Electronically Signed   By: Burman Nieves M.D.   On: 11/14/2014 23:28   I have personally reviewed and evaluated these images and lab results as part of my medical decision-making.   EKG Interpretation   Date/Time:  Wednesday November 14 2014 21:03:35 EDT Ventricular Rate:  53 PR Interval:    QRS Duration: 78 QT Interval:  496 QTC Calculation: 466 R Axis:   29 Text Interpretation:  Sinus rhythm Abnormal R-wave progression, early  transition Baseline wander in lead(s) V2 Confirmed by Rubin Payor  MD,  Harrold Donath [phone]) on 11/14/2014 11:07:35 PM      MDM  Ms. Warr is a 59 yo female w/ PMHx of HTN, OSA, hypothyroidism, and Sjogren's presenting with abdominal pain, nausea, and diarrhea. Patient 8 wings and pizza from [employer] 3-4 days ago following which she began experiencing generalized abdominal pain associated with nausea and nonbloody watery stools. Associated symptoms include dysuria, urinary frequency, headache, body aches, dry cough, rhinorrhea, nasal congestion, and chills. Patient denies fever or neck pain.  Exam above notable for obese elderly female lying in stretcher in no acute distress. Afebrile. Mildly hypertensive with SBP's and 60s to 80s. Heart rate 60s. Not tachypneic. Breathing on  room air and maintaining saturations without submental auction. Abdomen diffusely tender without guarding or rebound. No CVA tenderness.  Patient symptoms seem consistent with flu-like illness or gastroenteritis. EKG showing no ST elevation/depression or T-wave changes. UA negative for infection or blood or severe dehydration. CMP unremarkable. Lipase 33. WBC 3.7. Hemoglobin 10.5. Chest x-ray showing no acute cardiopulmonary process. TSH 8.1 (patient not able to keep levothyroxin down at home given N/V).  Patient feeling much better after IV fluids, Zofran, GI cocktail. Patient discharged home in stable condition with prescription for Zofran ODT. Strict ED return precautions discussed. Patient and husband understand and agree with the plan and have no further questions concerning this time.  Pt care discussed with and followed by my attending, Dr. Benjiman Core   Final diagnoses:  Flu-like symptoms  Gastroenteritis  Hypothyroidism, unspecified hypothyroidism type    Angelina Ok, MD 11/15/14 1610  Benjiman Core, MD 11/15/14 1329

## 2014-11-14 NOTE — ED Notes (Signed)
Pt here for abd pain, nausea and diarrhea. sts a few days.

## 2014-12-04 ENCOUNTER — Emergency Department (HOSPITAL_COMMUNITY)
Admission: EM | Admit: 2014-12-04 | Discharge: 2014-12-04 | Disposition: A | Discharge status: Home / Self Care | Payer: Medicare HMO | Attending: Emergency Medicine | Admitting: Emergency Medicine

## 2014-12-04 ENCOUNTER — Encounter (HOSPITAL_COMMUNITY): Payer: Self-pay | Admitting: Emergency Medicine

## 2014-12-04 DIAGNOSIS — Z9981 Dependence on supplemental oxygen: Secondary | ICD-10-CM | POA: Insufficient documentation

## 2014-12-04 DIAGNOSIS — L259 Unspecified contact dermatitis, unspecified cause: Secondary | ICD-10-CM | POA: Insufficient documentation

## 2014-12-04 DIAGNOSIS — Z8742 Personal history of other diseases of the female genital tract: Secondary | ICD-10-CM | POA: Diagnosis not present

## 2014-12-04 DIAGNOSIS — D509 Iron deficiency anemia, unspecified: Secondary | ICD-10-CM | POA: Insufficient documentation

## 2014-12-04 DIAGNOSIS — Z8739 Personal history of other diseases of the musculoskeletal system and connective tissue: Secondary | ICD-10-CM | POA: Insufficient documentation

## 2014-12-04 DIAGNOSIS — G4733 Obstructive sleep apnea (adult) (pediatric): Secondary | ICD-10-CM | POA: Insufficient documentation

## 2014-12-04 DIAGNOSIS — R21 Rash and other nonspecific skin eruption: Secondary | ICD-10-CM | POA: Diagnosis present

## 2014-12-04 DIAGNOSIS — L03113 Cellulitis of right upper limb: Secondary | ICD-10-CM | POA: Diagnosis not present

## 2014-12-04 DIAGNOSIS — E039 Hypothyroidism, unspecified: Secondary | ICD-10-CM | POA: Insufficient documentation

## 2014-12-04 DIAGNOSIS — Z79899 Other long term (current) drug therapy: Secondary | ICD-10-CM | POA: Insufficient documentation

## 2014-12-04 DIAGNOSIS — I10 Essential (primary) hypertension: Secondary | ICD-10-CM | POA: Diagnosis not present

## 2014-12-04 LAB — I-STAT CHEM 8, ED
BUN: 10 mg/dL (ref 6–20)
Calcium, Ion: 1.1 mmol/L — ABNORMAL LOW (ref 1.13–1.30)
Chloride: 101 mmol/L (ref 101–111)
Creatinine, Ser: 1 mg/dL (ref 0.44–1.00)
Glucose, Bld: 93 mg/dL (ref 65–99)
HCT: 40 % (ref 36.0–46.0)
Hemoglobin: 13.6 g/dL (ref 12.0–15.0)
Potassium: 3.8 mmol/L (ref 3.5–5.1)
Sodium: 139 mmol/L (ref 135–145)
TCO2: 26 mmol/L (ref 0–100)

## 2014-12-04 MED ORDER — CLINDAMYCIN HCL 300 MG PO CAPS
300.0000 mg | ORAL_CAPSULE | Freq: Once | ORAL | Status: AC
  Administered 2014-12-04: 300 mg via ORAL
  Filled 2014-12-04: qty 1

## 2014-12-04 MED ORDER — TRIAMCINOLONE ACETONIDE 0.025 % EX OINT: 1.0000 "application " | TOPICAL_OINTMENT | Freq: Two times a day (BID) | CUTANEOUS | Status: DC

## 2014-12-04 MED ORDER — NYSTATIN 100000 UNIT/GM EX CREA: TOPICAL_CREAM | CUTANEOUS | Status: DC

## 2014-12-04 MED ORDER — CLINDAMYCIN HCL 150 MG PO CAPS: 300.0000 mg | ORAL_CAPSULE | Freq: Three times a day (TID) | ORAL | Status: DC

## 2014-12-04 MED ORDER — DIPHENHYDRAMINE HCL 25 MG PO CAPS
50.0000 mg | ORAL_CAPSULE | Freq: Once | ORAL | Status: AC
  Administered 2014-12-04: 50 mg via ORAL
  Filled 2014-12-04: qty 2

## 2014-12-04 NOTE — ED Notes (Signed)
Pt states had medicine change last week-

## 2014-12-04 NOTE — ED Notes (Signed)
To ED via private vehicle with c/o rash started on right arm, reddened and small blisters noted. States is itchy and spreading, right leg itching-- no rash at present. Also states that she is itchy under panus -- reddened area noted.

## 2014-12-04 NOTE — ED Provider Notes (Signed)
CSN: [id number]     Arrival date & time 12/04/14  0920 History   First MD Initiated Contact with Patient 12/04/14 0940     Chief Complaint  Patient presents with  . Rash     (Consider location/radiation/quality/duration/timing/severity/associated sxs/prior Treatment) HPI Comments: 59 year old female with hypertension, OSA, Sjogren syndrome who presents with rash. Patient states that approximately one week ago, she had a mosquito bite on her right forearm. She began itching and and later began developing a rash on her right arm that has spread up her arm. She has been using over-the-counter medications including Gold Bond itch lotion and blue Star ointment without relief. The rash is very itchy and she complains of mild right arm pain and swelling. She is also noticed a rash under her pannus. She denies any fevers but does state that she had a vomiting and diarrhea illness associated with abdominal pain a few days ago. The vomiting has improved but the diarrhea persists every time she eats. No new medications or recent antibiotic use. No one in the family has this rash. No outdoor exposure.  Patient is a 59 y.o. female presenting with rash. The history is provided by the patient.  Rash   Past Medical History  Diagnosis Date  . Primary Sjogren's syndrome     anti Ro+;ANA>1:1280 in homogen pattern, negative ds DNA/RF/anti Smith/RNP/C3-4 comp/la/jo1/Scleroderma/centromere, neg HIV/ACE/Hep B/C, nl CXR 2/05, schirmer salivary glandtest not done, symptom rx:eye drops/prednisone/plaquenil referral to [facility] rheum, [name] 3/07. saw [name] but stopped 2/2 cost.  . Hypertension     no LVH EKG-7/05, nl M/C ratio 4/07  . Morbid obesity   . Back pain     chronic low back pain L4-5 discectomy:11/99. degenerative thoracic spondylotic changes 4/07  . Obstructive sleep apnea     severe 7/05 RD 161 per hr. /CPAP 18cwp  . Hypothyroidism     09/1998  . Microcytic anemia     09/1998.  Hb-10.5/MCV 76. needs ferritin to determine ACD vs IDA  . Menorrhagia   . Uterine bleeding   . Lower extremity edema     echo EF 55-65% w/o evidence of Dias dysfx.,   . Polyarthralgia     (knee/back/ankle) w/dx of fibromyalgia? given by [facility] rheum, knee pain chronic 2/2 obesity, fibromyalgia and Sjogren's.  . Chest pain     neg adenosine myoview.   Past Surgical History  Procedure Laterality Date  . Tonsillectomy    . Lumbar fusion  1999  . Shoulder surgery  unk  . Hysteroscopy w/d&c  11/23/2011    Procedure: DILATATION AND CURETTAGE /HYSTEROSCOPY;  Surgeon: Tereso Newcomer, MD;  Location: [facility];  Service: Gynecology;  Laterality: N/A;  . Examination under anesthesia  11/23/2011    Procedure: EXAM UNDER ANESTHESIA;  Surgeon: Tereso Newcomer, MD;  Location: [facility];  Service: Gynecology;  Laterality: N/A;  . Tubal ligation  1977  . Wisdom tooth extraction     Family History  Problem Relation Age of Onset  . Hypertension Mother   . Cancer Mother   . Cancer Father   . Cancer Sister   . Ovarian cancer      family history  . Breast cancer      family history in 1st degree relatives.  . Thyroid disease Sister   . Colon cancer Neg Hx   . Stomach cancer Neg Hx    Social History  Substance Use Topics  . Smoking status: Never Smoker   . Smokeless tobacco: Never Used  .  Alcohol Use: No   OB History    No data available     Review of Systems  Skin: Positive for rash.    10 Systems reviewed and are negative for acute change except as noted in the HPI.   Allergies  Amlodipine; Coconut fatty acids; and Naproxen sodium  Home Medications   Prior to Admission medications   Medication Sig Start Date End Date Taking? Authorizing [place]  acetaminophen-codeine (TYLENOL #3) 300-30 MG per tablet Take 1 tablet by mouth every 4 (four) hours as needed for severe pain. 10/10/14   Marrian Salvage, MD  Alum & Mag Hydroxide-Simeth (GI COCKTAIL) SUSP suspension Take 10 mLs by mouth 2  (two) times daily as needed for indigestion. Shake well. 03/20/14   Marrian Salvage, MD  clindamycin (CLEOCIN) 150 MG capsule Take 2 capsules (300 mg total) by mouth 3 (three) times daily. May dispense as  capsules 12/04/14   Laurence Spates, MD  diclofenac sodium (VOLTAREN) 1 % GEL Apply 4 g topically 4 (four) times daily. 07/12/14   Earl Lagos, MD  docusate sodium (COLACE) 100 MG capsule Take 1 capsule (100 mg total) by mouth 2 (two) times daily as needed for constipation. 11/23/11   Tereso Newcomer, MD  ferrous sulfate 325 (65 FE) MG tablet Take 1 tablet (325 mg total) by mouth 3 (three) times daily with meals. 05/24/13   Judie Bonus, MD  fluticasone [facility]) 50 MCG/ACT nasal spray PLACE 1 SPRAY INTO THE NOSE EVERY MORNING. 01/23/14   Yolanda Manges, DO  guaiFENesin (MUCINEX) 600 MG 12 hr tablet Take 1 tablet (600 mg total) by mouth 2 (two) times daily. 01/23/14 01/23/15  Yolanda Manges, DO  levothyroxine (SYNTHROID, LEVOTHROID) 50 MCG tablet TAKE 1 TABLET BY MOUTH EVERY DAY 07/12/14   Earl Lagos, MD  lisinopril-hydrochlorothiazide (PRINZIDE,ZESTORETIC) 20-12.5 MG per tablet Take 1 tablet by mouth daily. Please counsel patient: stop chlorthalidone and lisinopril. 11/02/14   Earl Lagos, MD  megestrol (MEGACE) 40 MG tablet Take 2 tablets (80 mg total) by mouth daily. 12/09/11   Tereso Newcomer, MD  metoprolol succinate (TOPROL-XL) 100 MG 24 hr tablet Take 1 tablet (100 mg total) by mouth daily. Take with or immediately following a meal. VOID RX'S ON FILE FOR METOPROLOL TARTRATE. 11/02/14   Earl Lagos, MD  omeprazole (PRILOSEC) 40 MG capsule Take 1 capsule (40 mg total) by mouth 2 (two) times daily. 04/13/14   Harold Barban, MD  ondansetron (ZOFRAN ODT) 4 MG disintegrating tablet Take 1 tablet (4 mg total) by mouth every 8 (eight) hours as needed for nausea or vomiting. 11/14/14   Angelina Ok, MD  ondansetron (ZOFRAN) 4 MG tablet Take 1 tablet (4 mg total) by mouth every 8  (eight) hours as needed for nausea or vomiting. 02/19/14   Ejiroghene E Mariea Clonts, MD  PROAIR HFA 108 (90 BASE) MCG/ACT inhaler INHALE 1 PUFF INTO THE LUNGS EVERY 6 (SIX) HOURS AS NEEDED FOR WHEEZING. 12/04/13   Nischal Narendra, MD  triamcinolone (KENALOG) 0.025 % ointment Apply 1 application topically 2 (two) times daily. Do not apply to face 12/04/14   Ambrose Finland Little, MD   BP 158/86 mmHg  Pulse 83  Temp(Src) 98 F (36.7 C) (Oral)  Resp 16  SpO2 98% Physical Exam  Constitutional: She is oriented to person, place, and time. She appears well-developed and well-nourished. No distress.  HENT:  Head: Normocephalic and atraumatic.  Moist mucous membranes  Eyes: Conjunctivae are normal. Pupils are  equal, round, and reactive to light.  Neck: Neck supple.  Cardiovascular: Normal rate, regular rhythm and normal heart sounds.   No murmur heard. Pulmonary/Chest: Effort normal and breath sounds normal.  Abdominal: Soft. Bowel sounds are normal. She exhibits no distension. There is no tenderness.  Musculoskeletal:  Mild swelling of R forearm w/ mild tenderness to palpation, 2+ radial pulses, normal sensation throughout  Neurological: She is alert and oriented to person, place, and time.  Fluent speech  Skin:  Erythematous, warm skin eruption on dorsal R forearm w/ small unruptured vesicles, moving up elbow w/ scattered areas on upper arm; erythema under pannus; erythematous, malar-like rash on face across nose and b/l cheeks  Psychiatric: She has a normal mood and affect. Judgment normal.  Nursing note and vitals reviewed.   ED Course  Procedures (including critical care time) Labs Review Labs Reviewed  I-STAT CHEM 8, ED - Abnormal; Notable for the following:    Calcium, Ion 1.10 (*)    All other components within normal limits    Imaging Review No results found. I have personally reviewed and evaluated these lab results as part of my medical decision-making.   EKG  Interpretation None     Medications  diphenhydrAMINE (BENADRYL) capsule 50 mg (50 mg Oral Given 12/04/14 1017)  clindamycin (CLEOCIN) capsule 300 mg (300 mg Oral Given 12/04/14 1018)    MDM   Final diagnoses:  Right arm cellulitis  Contact dermatitis  candidal skin infection   59 year old female who presents with 1 week of worsening, itchy rash on right arm that has not improved despite applying several over-the-counter medications. She also notes rash under pannus and on face. Patient well-appearing at presentation with reassuring VS. rash on abdomen consistent with Candida. Given the warmth and swelling of her right arm, I am concerned about cellulitis especially because her rash started with an insect bite that she excoriated. I have given a dose of clindamycin as well as course of clindamycin for use at home. The vesicular appearance is also concerning for contact dermatitis and patient has used multiple over-the-counter products. Instructed to discontinue these products and gave triamcinolone cream to use only on arm. Face rash is not the same as rash on her arm and I suspect it may be related to her underlying Sjogren's. I've instructed to follow-up with PCP and/or rheumatology. Return precautions reviewed. Patient discharged in satisfactory condition.  Laurence Spates, MD 12/04/14 [phone]

## 2014-12-06 ENCOUNTER — Other Ambulatory Visit: Payer: Self-pay | Admitting: Internal Medicine

## 2014-12-06 NOTE — Telephone Encounter (Signed)
Pt called requesting acetaminophen-codeine to be filled.

## 2014-12-06 NOTE — Telephone Encounter (Signed)
Last refill per pharmacy was 8/4 for # 20

## 2014-12-07 MED ORDER — ACETAMINOPHEN-CODEINE #3 300-30 MG PO TABS: 1.0000 | ORAL_TABLET | ORAL | Status: DC | PRN

## 2014-12-07 NOTE — Telephone Encounter (Signed)
Rx called in 

## 2014-12-10 NOTE — Telephone Encounter (Signed)
Pharm called did not have a dispense #, gave them script again

## 2014-12-11 ENCOUNTER — Ambulatory Visit (INDEPENDENT_AMBULATORY_CARE_PROVIDER_SITE_OTHER): Payer: Medicare HMO | Admitting: Internal Medicine

## 2014-12-11 VITALS — BP 183/91 | HR 73 | Temp 97.8°F | Ht 61.0 in | Wt 252.4 lb

## 2014-12-11 DIAGNOSIS — M3500 Sicca syndrome, unspecified: Secondary | ICD-10-CM

## 2014-12-11 DIAGNOSIS — D509 Iron deficiency anemia, unspecified: Secondary | ICD-10-CM

## 2014-12-11 DIAGNOSIS — G8929 Other chronic pain: Secondary | ICD-10-CM

## 2014-12-11 DIAGNOSIS — I10 Essential (primary) hypertension: Secondary | ICD-10-CM | POA: Diagnosis not present

## 2014-12-11 DIAGNOSIS — M25562 Pain in left knee: Secondary | ICD-10-CM

## 2014-12-11 DIAGNOSIS — B373 Candidiasis of vulva and vagina: Secondary | ICD-10-CM | POA: Diagnosis not present

## 2014-12-11 DIAGNOSIS — J309 Allergic rhinitis, unspecified: Secondary | ICD-10-CM | POA: Diagnosis not present

## 2014-12-11 DIAGNOSIS — R21 Rash and other nonspecific skin eruption: Secondary | ICD-10-CM | POA: Insufficient documentation

## 2014-12-11 DIAGNOSIS — M25561 Pain in right knee: Secondary | ICD-10-CM

## 2014-12-11 DIAGNOSIS — M25569 Pain in unspecified knee: Secondary | ICD-10-CM

## 2014-12-11 DIAGNOSIS — B3731 Acute candidiasis of vulva and vagina: Secondary | ICD-10-CM | POA: Insufficient documentation

## 2014-12-11 MED ORDER — CHLORTHALIDONE 25 MG PO TABS: 50.0000 mg | ORAL_TABLET | Freq: Every day | ORAL | Status: DC

## 2014-12-11 MED ORDER — NYSTATIN 100000 UNIT/GM EX CREA: TOPICAL_CREAM | CUTANEOUS | Status: DC

## 2014-12-11 MED ORDER — FERROUS SULFATE 325 (65 FE) MG PO TABS: 325.0000 mg | ORAL_TABLET | Freq: Three times a day (TID) | ORAL | Status: DC

## 2014-12-11 MED ORDER — TRIAMCINOLONE ACETONIDE 0.025 % EX OINT: 1.0000 "application " | TOPICAL_OINTMENT | Freq: Two times a day (BID) | CUTANEOUS | Status: DC

## 2014-12-11 MED ORDER — METOPROLOL TARTRATE 50 MG PO TABS: 50.0000 mg | ORAL_TABLET | Freq: Two times a day (BID) | ORAL | Status: DC

## 2014-12-11 MED ORDER — ACETAMINOPHEN-CODEINE #3 300-30 MG PO TABS: 1.0000 | ORAL_TABLET | ORAL | Status: DC | PRN

## 2014-12-11 MED ORDER — LISINOPRIL 20 MG PO TABS: 20.0000 mg | ORAL_TABLET | Freq: Every day | ORAL | Status: DC

## 2014-12-11 MED ORDER — GUAIFENESIN ER 600 MG PO TB12: 600.0000 mg | ORAL_TABLET | Freq: Two times a day (BID) | ORAL | Status: DC

## 2014-12-11 NOTE — Assessment & Plan Note (Addendum)
The differential for her arm rash includes autoimmune etiologies or drug rash. Cellulitis seems less likely since it is bilateral, has no clear antecedent (e.g. Injury), and occurred concomitantly with a facial rash. It appears to be improving on her triamcinolone regimen.  Plan - Continue Triamcinolone cream for 2 weeks - Discontinue clindamycin, told to seek medical attention immediately if diarrhea worsens or if she develops a fever - Go back to previous HTN regimen (See "Essential Hypertension") - Reassess improvement on Oct. 11 appointment

## 2014-12-11 NOTE — Assessment & Plan Note (Signed)
She was complaining of some congestion, rhinorrhea, and postnasal drip, which she typically gets during the autumn months. She denies fever, cough, or sick contacts. She reports that mucinex has worked well for her in the past. - Will refill Mucinex

## 2014-12-11 NOTE — Progress Notes (Signed)
   Subjective:    Patient ID: Nancy [name], female    DOB: 07-Apr-1955, 59 y.o.   MRN: [id number]  HPI  [name] complains of a rash that emerged two weeks ago shortly after starting her new hypertensive medications (see "Essential Hypertension" in assessment and plan). At first, it was on her arms and face. She described it as extremely itchy, but non-painful. It was associated with some vaginal itching, although she did not notice a vaginal rash. She thought that maybe she had been bit by a mosquito, but upon questioning today, she said she never actually noticed a mosquito or a bug bite on her. She denied any fever, sick contacts. She denied using any new detergents, skin care products, contact with poison ivy, or new lotions. She denied any prolonged exposure to the sun. She visited the ED on 9/27, and it was felt at that time, her facial rash could be explained by Sjogren's whereas the arm rash was related to cellulitis from a bug bite. According to the ED note, she had diarrhea at the time, but today she says the diarrhea did not start until she was started on clindamycin. She says she has non-bloody, watery loose stool every time she eats. She was provided nystatin for her vaginal itching, clindamycin for presumed cellulitis, and triamcinolone for her arm rash. Since then, her facial rash has completely resolved, and her arm rash and vaginal itching have improved. Her diarrhea has persisted.  Review of Systems  Constitutional: Positive for fatigue. Negative for fever and chills.  HENT: Positive for congestion and rhinorrhea.        Dry mouth  Eyes: Positive for itching.       Dry eyes  Respiratory: Negative for chest tightness, shortness of breath and wheezing.   Cardiovascular: Negative for chest pain and palpitations.  Gastrointestinal: Positive for diarrhea. Negative for nausea, vomiting, abdominal pain and blood in stool.  Endocrine: Negative for cold intolerance, heat intolerance and  polyuria.  Genitourinary: Negative for dysuria.  Musculoskeletal: Positive for myalgias, back pain, joint swelling and arthralgias.  Skin: Positive for rash.  Neurological: Positive for headaches.       Objective:   Physical Exam  Constitutional: She is oriented to person, place, and time. She appears well-developed and well-nourished. No distress.  Obese, sitting in wheelchair.  HENT:  Mouth/Throat: Oropharynx is clear and moist. No oropharyngeal exudate.  Eyes: EOM are normal. Pupils are equal, round, and reactive to light.  No dryness or keratoconjunctivitis  Neck: No thyromegaly present.  Cardiovascular: Normal rate and regular rhythm.  Exam reveals no friction rub.   No murmur heard. Pulmonary/Chest: Effort normal and breath sounds normal. No respiratory distress. She has no wheezes.  Abdominal: Soft. Bowel sounds are normal. She exhibits no distension. There is no tenderness.  Musculoskeletal: Normal range of motion. She exhibits tenderness. She exhibits no edema.  Lymphadenopathy:    She has no cervical adenopathy.  Neurological: She is alert and oriented to person, place, and time.  5/5 strength in extremities, but painful  Skin: Rash noted.     Erythematous, inflammatory rash from forearm to mid-humeral region on left, smaller rash restricted to forearm on right (see diagram), non-tender, non-blanching. No rashes noted on face or hands.          Assessment & Plan:  Please see problem-based assessment an plan for details.

## 2014-12-11 NOTE — Assessment & Plan Note (Signed)
Improving on nystatin cream. - Refill nystatin cream

## 2014-12-11 NOTE — Progress Notes (Signed)
Internal Medicine Clinic Attending  I saw and evaluated the patient.  I personally confirmed the key portions of the history and exam documented by Dr. Ala Dach and I reviewed pertinent patient test results.  The assessment, diagnosis, and plan were formulated together and I agree with the documentation in the resident's note.  Inflammatory appearing rash on both her arms that is improving with topical steroids. It does not appear to be cellulitis to me, so I have asked her to stop taking clindamycin for now. Could be psoriasis or eczema given errythema with underlying scale. Plan for another week of triamcinolone, follow up with our clinic, and dermatology referral if not resolved.

## 2014-12-11 NOTE — Assessment & Plan Note (Signed)
Upon speaking with patient, unaware of this diagnosis of Sjogren's/Sicca syndrome. Said she has not seen a rheumatologist in "over five years," and that her previous rheumatologist was at [facility]. Reports some persistent dry eyes, for which she takes OTC eye drops. - Provided patient education on autoimmune conditions

## 2014-12-11 NOTE — Assessment & Plan Note (Signed)
Well treated. HgB at 9/27 ED visit was >13. - Refill Ferrous sulfate

## 2014-12-11 NOTE — Patient Instructions (Addendum)
Ms. Chaudhary, it was a pleasure to meet you today. The cause of your rash is unclear at this point, but it appears to be improving on the steroid cream. Please continue to use the steroid cream (Triamcinlone) for two more weeks. We will be able to see if it is continuing to improve by your next clinic visit on October 11 with Dr. Heide Spark. Please stop taking the antibiotic clindamycin, because it is unlikely that it is an infection and it seems to be causing your diarrhea. If your diarrhea worsens or if you develop a fever (>100.4) please seek medical attention immediately. If your skin rash has not improved by your next visit on October 11, you may need to see a dermatologist (skin doctor).  We have also placed you back on your old drugs for hypertension, just in case your new hypertension drug caused the rash.

## 2014-12-11 NOTE — Assessment & Plan Note (Signed)
Nancy [name] endorses 9/10 knee pain that that is most likely OA. She had not taken any of her Tylenol 3 in two weeks, which had been helping her.  - Will refill Tylenol 3, assess for improvement on 12/18/14 visit.

## 2014-12-11 NOTE — Assessment & Plan Note (Signed)
Blood pressure is uncontrolled today at 180/91. She says she has not taken any of her blood pressure medications since Saturday because she thought that some of the recent medication changes may have contributed to her rash. She used to be on lisinopril, chlorthalidone, and metoprolol tartrate before 8/19, but these were recently changed to Lisonipril-HCTZ (combined), and metoprolol succinate to aid with adherence. While it is unlikely that this is a drug rash given the skin distribution, we will place her back on her original medication in case the succinate salt or HCTZ is contributing to her rash. She has already had a BMET this year. -  RefillMetoprolol Tartrate 50 mg BID -  RefillChlorthalidone 25 mg BID -  Refill Lisinopril 20 mg Daily

## 2014-12-18 ENCOUNTER — Ambulatory Visit (INDEPENDENT_AMBULATORY_CARE_PROVIDER_SITE_OTHER): Payer: Medicare HMO | Admitting: Internal Medicine

## 2014-12-18 ENCOUNTER — Other Ambulatory Visit: Payer: Self-pay | Admitting: Internal Medicine

## 2014-12-18 ENCOUNTER — Encounter: Payer: Self-pay | Admitting: Internal Medicine

## 2014-12-18 VITALS — BP 171/89 | HR 75 | Temp 98.0°F | Ht 61.0 in | Wt 252.9 lb

## 2014-12-18 DIAGNOSIS — R21 Rash and other nonspecific skin eruption: Secondary | ICD-10-CM | POA: Diagnosis not present

## 2014-12-18 DIAGNOSIS — Z79899 Other long term (current) drug therapy: Secondary | ICD-10-CM

## 2014-12-18 DIAGNOSIS — Z1231 Encounter for screening mammogram for malignant neoplasm of breast: Secondary | ICD-10-CM

## 2014-12-18 DIAGNOSIS — Z0000 Encounter for general adult medical examination without abnormal findings: Secondary | ICD-10-CM | POA: Insufficient documentation

## 2014-12-18 DIAGNOSIS — E039 Hypothyroidism, unspecified: Secondary | ICD-10-CM | POA: Diagnosis not present

## 2014-12-18 DIAGNOSIS — I10 Essential (primary) hypertension: Secondary | ICD-10-CM

## 2014-12-18 HISTORY — DX: Encounter for general adult medical examination without abnormal findings: Z00.00

## 2014-12-18 NOTE — Assessment & Plan Note (Signed)
-   rash is resolving - c/w triamcinolone cream - Possibly eczema or contact dermatitis. Will monitor

## 2014-12-18 NOTE — Assessment & Plan Note (Addendum)
-   TSH was elevated in the ED in September while patient was ill - Will check TSH and free T4 today - continue with current synthroid dose for now

## 2014-12-18 NOTE — Patient Instructions (Signed)
- It was a pleasure seeing you today - We will arrange a mammogram appointment and call you - Continue with the triamcinolone cream for the rash - Please maintain a BP log at home and bring it on your next appointment - Also bring your BP machine so we can check it against ours - Goal weight loss over the next 3 months is 3-5 lbs.    Calorie Counting for Weight Loss Calories are energy you get from the things you eat and drink. Your body uses this energy to keep you going throughout the day. The number of calories you eat affects your weight. When you eat more calories than your body needs, your body stores the extra calories as fat. When you eat fewer calories than your body needs, your body burns fat to get the energy it needs. Calorie counting means keeping track of how many calories you eat and drink each day. If you make sure to eat fewer calories than your body needs, you should lose weight. In order for calorie counting to work, you will need to eat the number of calories that are right for you in a day to lose a healthy amount of weight per week. A healthy amount of weight to lose per week is usually 1-2 lb (0.5-0.9 kg). A dietitian can determine how many calories you need in a day and give you suggestions on how to reach your calorie goal.  WHAT IS MY MY PLAN? My goal is to have __________ calories per day.  If I have this many calories per day, I should lose around __________ pounds per week. WHAT DO I NEED TO KNOW ABOUT CALORIE COUNTING? In order to meet your daily calorie goal, you will need to:  Find out how many calories are in each food you would like to eat. Try to do this before you eat.  Decide how much of the food you can eat.  Write down what you ate and how many calories it had. Doing this is called keeping a food log. WHERE DO I FIND CALORIE INFORMATION? The number of calories in a food can be found on a Nutrition Facts label. Note that all the information on a label is  based on a specific serving of the food. If a food does not have a Nutrition Facts label, try to look up the calories online or ask your dietitian for help. HOW DO I DECIDE HOW MUCH TO EAT? To decide how much of the food you can eat, you will need to consider both the number of calories in one serving and the size of one serving. This information can be found on the Nutrition Facts label. If a food does not have a Nutrition Facts label, look up the information online or ask your dietitian for help. Remember that calories are listed per serving. If you choose to have more than one serving of a food, you will have to multiply the calories per serving by the amount of servings you plan to eat. For example, the label on a package of bread might say that a serving size is 1 slice and that there are 90 calories in a serving. If you eat 1 slice, you will have eaten 90 calories. If you eat 2 slices, you will have eaten 180 calories. HOW DO I KEEP A FOOD LOG? After each meal, record the following information in your food log:  What you ate.  How much of it you ate.  How many calories  it had.  Then, add up your calories. Keep your food log near you, such as in a small notebook in your pocket. Another option is to use a mobile app or website. Some programs will calculate calories for you and show you how many calories you have left each time you add an item to the log. WHAT ARE SOME CALORIE COUNTING TIPS?  Use your calories on foods and drinks that will fill you up and not leave you hungry. Some examples of this include foods like nuts and nut butters, vegetables, lean proteins, and high-fiber foods (more than 5 g fiber per serving).  Eat nutritious foods and avoid empty calories. Empty calories are calories you get from foods or beverages that do not have many nutrients, such as candy and soda. It is better to have a nutritious high-calorie food (such as an avocado) than a food with few nutrients (such as a  bag of chips).  Know how many calories are in the foods you eat most often. This way, you do not have to look up how many calories they have each time you eat them.  Look out for foods that may seem like low-calorie foods but are really high-calorie foods, such as baked goods, soda, and fat-free candy.  Pay attention to calories in drinks. Drinks such as sodas, specialty coffee drinks, alcohol, and juices have a lot of calories yet do not fill you up. Choose low-calorie drinks like water and diet drinks.  Focus your calorie counting efforts on higher calorie items. Logging the calories in a garden salad that contains only vegetables is less important than calculating the calories in a milk shake.  Find a way of tracking calories that works for you. Get creative. Most people who are successful find ways to keep track of how much they eat in a day, even if they do not count every calorie. WHAT ARE SOME PORTION CONTROL TIPS?  Know how many calories are in a serving. This will help you know how many servings of a certain food you can have.  Use a measuring cup to measure serving sizes. This is helpful when you start out. With time, you will be able to estimate serving sizes for some foods.  Take some time to put servings of different foods on your favorite plates, bowls, and cups so you know what a serving looks like.  Try not to eat straight from a bag or box. Doing this can lead to overeating. Put the amount you would like to eat in a cup or on a plate to make sure you are eating the right portion.  Use smaller plates, glasses, and bowls to prevent overeating. This is a quick and easy way to practice portion control. If your plate is smaller, less food can fit on it.  Try not to multitask while eating, such as watching TV or using your computer. If it is time to eat, sit down at a table and enjoy your food. Doing this will help you to start recognizing when you are full. It will also make you  more aware of what and how much you are eating. HOW CAN I CALORIE COUNT WHEN EATING OUT?  Ask for smaller portion sizes or child-sized portions.  Consider sharing an entree and sides instead of getting your own entree.  If you get your own entree, eat only half. Ask for a box at the beginning of your meal and put the rest of your entree in it so you  are not tempted to eat it.  Look for the calories on the menu. If calories are listed, choose the lower calorie options.  Choose dishes that include vegetables, fruits, whole grains, low-fat dairy products, and lean protein. Focusing on smart food choices from each of the 5 food groups can help you stay on track at restaurants.  Choose items that are boiled, broiled, grilled, or steamed.  Choose water, milk, unsweetened iced tea, or other drinks without added sugars. If you want an alcoholic beverage, choose a lower calorie option. For example, a regular margarita can have up to 700 calories and a glass of wine has around 150.  Stay away from items that are buttered, battered, fried, or served with cream sauce. Items labeled "crispy" are usually fried, unless stated otherwise.  Ask for dressings, sauces, and syrups on the side. These are usually very high in calories, so do not eat much of them.  Watch out for salads. Many people think salads are a healthy option, but this is often not the case. Many salads come with bacon, fried chicken, lots of cheese, fried chips, and dressing. All of these items have a lot of calories. If you want a salad, choose a garden salad and ask for grilled meats or steak. Ask for the dressing on the side, or ask for olive oil and vinegar or lemon to use as dressing.  Estimate how many servings of a food you are given. For example, a serving of cooked rice is  cup or about the size of half a tennis ball or one cupcake wrapper. Knowing serving sizes will help you be aware of how much food you are eating at restaurants.  The list below tells you how big or small some common portion sizes are based on everyday objects.  1 oz--4 stacked dice.  3 oz--1 deck of cards.  1 tsp--1 dice.  1 Tbsp-- a Ping-Pong ball.  2 Tbsp--1 Ping-Pong ball.   cup--1 tennis ball or 1 cupcake wrapper.  1 cup--1 baseball.   This information is not intended to replace advice given to you by your health care provider. Make sure you discuss any questions you have with your health care provider.   Document Released: 02/23/2005 Document Revised: 03/16/2014 Document Reviewed: 12/29/2012 Elsevier Interactive Patient Education [employer]

## 2014-12-18 NOTE — Assessment & Plan Note (Signed)
-   Patient refuses flu shot today. Risks and benefits explained to patients - Will try and schedule mammogram for patient. She is agreeable

## 2014-12-18 NOTE — Progress Notes (Signed)
   Subjective:    Patient ID: Nancy [name], female    DOB: [birth year], 59 y.o.   MRN: [id number]  HPI Patient seen and examined. She is here for follow up a rash over her arms as well as HTN follow up. Patient states she developed a pruritic rash over her arms 2 weeks ago and was seen in the ED and prescribed clindamycin and triamcinolone cream. She was seen at [facility] last week and clindamycin was stopped. Rash was likely eczema or dermatitis. She states that on triamcinolone her rash has almost resolved.  She states her BP is normal at home and she checks it daily. She did not take her pain meds or BP meds today as it occasionally makes her drowsy.  She also refuses a flu shot at this time. States she got it once and it made her sick. Risks and benefits explained to patient. She expresses understanding. She also complains of bilateral knee pain and back pain which is chronic. Only partially controlled with voltaren gel and tylenol 3.  Review of Systems  Constitutional: Negative.   HENT: Negative.   Eyes: Negative.   Respiratory: Negative.   Cardiovascular: Negative.   Gastrointestinal: Negative.   Musculoskeletal: Positive for back pain and arthralgias. Negative for myalgias, joint swelling and neck pain.  Skin: Negative.   Neurological: Negative.   Psychiatric/Behavioral: Negative.        Objective:   Physical Exam  Constitutional: She is oriented to person, place, and time. She appears well-developed and well-nourished.  HENT:  Head: Normocephalic and atraumatic.  Neck: Normal range of motion. Neck supple.  Cardiovascular: Normal rate, regular rhythm and normal heart sounds.   Pulmonary/Chest: Effort normal and breath sounds normal. No respiratory distress.  Abdominal: Soft. Bowel sounds are normal. She exhibits no distension. There is no tenderness.  Musculoskeletal: Normal range of motion.  Trace b/l LE edema +  Lymphadenopathy:    She has no cervical adenopathy.  Neurological:  She is alert and oriented to person, place, and time.  Skin: Skin is warm and dry. Rash noted.  Resolving erythematous rash over bilateral forearms   Psychiatric: She has a normal mood and affect. Her behavior is normal.          Assessment & Plan:  Please see problem based charting for assessment and plan:

## 2014-12-18 NOTE — Assessment & Plan Note (Signed)
BP Readings from Last 3 Encounters:  12/18/14 171/89  12/11/14 183/91  12/04/14 161/80    Lab Results  Component Value Date   NA 139 12/04/2014   K 3.8 12/04/2014   CREATININE 1.00 12/04/2014    Assessment: Blood pressure control:  uncontrolled Progress toward BP goal:   improved Comments: patient has not taken pain meds or BP meds this AM. She usually takes them after her appointments  Plan: Medications:  continue current medications Educational resources provided:   Self management tools provided:   Other plans: Patient to maintain BP log at home and bring log and BP machine to next appointment

## 2014-12-19 LAB — TSH: TSH: 4.85 u[IU]/mL — ABNORMAL HIGH (ref 0.450–4.500)

## 2014-12-19 LAB — T4, FREE: Free T4: 0.91 ng/dL (ref 0.82–1.77)

## 2015-01-03 ENCOUNTER — Ambulatory Visit
Admission: RE | Admit: 2015-01-03 | Discharge: 2015-01-03 | Disposition: A | Discharge status: Home / Self Care | Payer: Medicare HMO | Source: Ambulatory Visit | Attending: Internal Medicine | Admitting: Internal Medicine

## 2015-01-03 DIAGNOSIS — Z1231 Encounter for screening mammogram for malignant neoplasm of breast: Secondary | ICD-10-CM

## 2015-02-27 ENCOUNTER — Other Ambulatory Visit: Payer: Self-pay | Admitting: Internal Medicine

## 2015-02-27 DIAGNOSIS — M25562 Pain in left knee: Principal | ICD-10-CM

## 2015-02-27 DIAGNOSIS — M25561 Pain in right knee: Secondary | ICD-10-CM

## 2015-02-27 MED ORDER — ACETAMINOPHEN-CODEINE #3 300-30 MG PO TABS: 1.0000 | ORAL_TABLET | ORAL | Status: DC | PRN

## 2015-02-27 NOTE — Telephone Encounter (Signed)
faxed

## 2015-02-27 NOTE — Telephone Encounter (Signed)
Will refill 20 tab.  Any further refills need to be evaluated by Dr. Heide SparkNarendra.

## 2015-02-27 NOTE — Telephone Encounter (Signed)
Patient requesting a refill on her tylenol #3

## 2015-04-08 ENCOUNTER — Ambulatory Visit (INDEPENDENT_AMBULATORY_CARE_PROVIDER_SITE_OTHER): Payer: Medicare HMO | Admitting: Internal Medicine

## 2015-04-08 ENCOUNTER — Encounter: Payer: Self-pay | Admitting: Internal Medicine

## 2015-04-08 VITALS — BP 194/90 | HR 81 | Temp 98.0°F | Ht 63.0 in | Wt 255.7 lb

## 2015-04-08 DIAGNOSIS — I10 Essential (primary) hypertension: Secondary | ICD-10-CM

## 2015-04-08 DIAGNOSIS — R21 Rash and other nonspecific skin eruption: Secondary | ICD-10-CM | POA: Diagnosis not present

## 2015-04-08 DIAGNOSIS — Z9114 Patient's other noncompliance with medication regimen: Secondary | ICD-10-CM | POA: Diagnosis not present

## 2015-04-08 DIAGNOSIS — E039 Hypothyroidism, unspecified: Secondary | ICD-10-CM | POA: Diagnosis not present

## 2015-04-08 DIAGNOSIS — D509 Iron deficiency anemia, unspecified: Secondary | ICD-10-CM | POA: Diagnosis not present

## 2015-04-08 DIAGNOSIS — Z79899 Other long term (current) drug therapy: Secondary | ICD-10-CM

## 2015-04-08 DIAGNOSIS — Z91148 Patient's other noncompliance with medication regimen for other reason: Secondary | ICD-10-CM

## 2015-04-08 MED ORDER — CLOBETASOL PROPIONATE 0.05 % EX OINT: 1.0000 "application " | TOPICAL_OINTMENT | Freq: Two times a day (BID) | CUTANEOUS | Status: DC

## 2015-04-08 MED ORDER — CHLORTHALIDONE 25 MG PO TABS: 25.0000 mg | ORAL_TABLET | Freq: Every day | ORAL | Status: DC

## 2015-04-08 MED ORDER — HYDROXYZINE HCL 50 MG PO TABS: 50.0000 mg | ORAL_TABLET | Freq: Three times a day (TID) | ORAL | Status: DC | PRN

## 2015-04-08 MED ORDER — LEVOTHYROXINE SODIUM 75 MCG PO TABS: ORAL_TABLET | ORAL | Status: DC

## 2015-04-08 MED ORDER — LEVOTHYROXINE SODIUM 50 MCG PO TABS: ORAL_TABLET | ORAL | Status: DC

## 2015-04-08 MED ORDER — FLUOCINOLONE ACETONIDE 0.01 % EX CREA: TOPICAL_CREAM | Freq: Two times a day (BID) | CUTANEOUS | Status: DC

## 2015-04-08 MED ORDER — METOPROLOL TARTRATE 50 MG PO TABS: 50.0000 mg | ORAL_TABLET | Freq: Two times a day (BID) | ORAL | Status: DC

## 2015-04-08 MED ORDER — LISINOPRIL 40 MG PO TABS: 40.0000 mg | ORAL_TABLET | Freq: Every day | ORAL | Status: DC

## 2015-04-08 MED FILL — METOPROLOL TARTRATE 50 MG T: 50 | 90 days supply | Qty: 180 | Fill #0

## 2015-04-08 MED FILL — CHLORTHALIDONE 25 MG TABLET: 25 | 90 days supply | Qty: 90 | Fill #0

## 2015-04-08 MED FILL — LISINOPRIL 40 MG TABLET: 40 | 90 days supply | Qty: 90 | Fill #0

## 2015-04-08 MED FILL — LEVOTHYROXINE 50 MCG TABLET: 50 | 90 days supply | Qty: 90 | Fill #0

## 2015-04-08 NOTE — Patient Instructions (Signed)
1. Take Hydroxyzine 1 tablet three times a day for itching. 2. Apply Clobetasol ointment to red, itchy areas on arms and legs twice daily until improved.  Do not apply to normal skin.  Do not apply to face or skin folds.  Apply Vaseline on top of ointment. 3. Apply Fluocinolone cream to red, itchy areas between folds twice daily until improved.  Do not apply for more than 1 week or to normal skin.  4. Follow up with Rheumatology. 5. Start taking Synthroid 75 mcg daily. 6. Start taking Lisinopril 40 mg daily. 7. Start taking Chlorthalidone 25 mg daily. 8. We will get some blood work.  Clobetasol Propionate Topical Ointment What is this medicine? CLOBETASOL (kloe BAY ta sol) is a corticosteroid. It is used on the skin to treat itching, redness, and swelling caused by some skin conditions. This medicine may be used for other purposes; ask your health care provider or pharmacist if you have questions. What should I tell my health care provider before I take this medicine? They need to know if you have any of these conditions: -any type of active infection including measles, tuberculosis, herpes, or chickenpox -circulation problems or vascular disease -large areas of burned or damaged skin -rosacea -skin wasting or thinning -an unusual or allergic reaction to clobetasol, corticosteroids, other medicines, foods, dyes, or preservatives -pregnant or trying to get pregnant -breast-feeding How should I use this medicine? This medicine is for external use only. Do not take by mouth. Follow the directions on the prescription label. Wash your hands before and after use. Apply a thin film of medicine to the affected area. Do not cover with a bandage or dressing unless your doctor or health care professional tells you to. Do not get this medicine in your eyes. If you do, rinse out with plenty of cool tap water. It is important not to use more medicine than prescribed. Do not use your medicine more often  than directed. To do so may increase the chance of side effects. Talk to your pediatrician regarding the use of this medicine in children. Special care may be needed. Elderly patients are more likely to have damaged skin through aging, and this may increase side effects. This medicine should only be used for brief periods and infrequently in older patients. Overdosage: If you think you have taken too much of this medicine contact a poison control center or emergency room at once. NOTE: This medicine is only for you. Do not share this medicine with others. What if I miss a dose? If you miss a dose, use it as soon as you can. If it is almost time for your next dose, use only that dose. Do not use double or extra doses without advice. What may interact with this medicine? Interactions are not expected. Do not use cosmetics or other skin care products on the treated area. This list may not describe all possible interactions. Give your health care provider a list of all the medicines, herbs, non-prescription drugs, or dietary supplements you use. Also tell them if you smoke, drink alcohol, or use illegal drugs. Some items may interact with your medicine. What should I watch for while using this medicine? Tell your doctor or health care professional if your symptoms do not get better within 2 weeks, or if you develop skin irritation from the medicine. Tell your doctor or health care professional if you are exposed to anyone with measles or chickenpox, or if you develop sores or blisters that do  not heal properly. What side effects may I notice from receiving this medicine? Side effects that you should report to your doctor or health care professional as soon as possible: -allergic reactions like skin rash, itching or hives, swelling of the face, lips, or tongue -changes in vision -lack of healing of the skin condition -painful, red, pus filled blisters on the skin or in hair follicles -thinning of the  skin with easy bruising Side effects that usually do not require medical attention (report to your doctor or health care professional if they continue or are bothersome): -burning, irritation of the skin -redness or scaling of the skin This list may not describe all possible side effects. Call your doctor for medical advice about side effects. You may report side effects to FDA at 1-800-FDA-1088. Where should I keep my medicine? Keep out of the reach of children. Store at room temperature between 15 and 30 degrees C (59 and 86 degrees F). Keep away from heat and direct light. Do not freeze. Throw away any unused medicine after the expiration date. NOTE: This sheet is a summary. It may not cover all possible information. If you have questions about this medicine, talk to your doctor, pharmacist, or health care provider.    2016, Elsevier/Gold Standard. (2007-06-08 17:22:39)

## 2015-04-08 NOTE — Progress Notes (Addendum)
Physician-Pharmacist Co-Visit Patient denies barriers to adherence although she states she has not picked up medications from pharmacy. She does request help from clinical pharmacist for medication management.  Contacted [facility] pharmacy to obtain refill history: Chlorthalidone 25 mg; lisinopril 20 mg, and metoprolol tartrate 50 mg: LF 12/11/14 for 30 DS Levothyroxine 50 mcg: LF 07/23/14 for 90 DS  Medication samples have been provided to the patient from the pharmacy Drug name: lisinopril 40 mg  Qty: 1  LOT: [id number]  Exp.Date: 09/06/15 Drug name: chlorthalidone 25 mg Qty: 1  LOT: [id number]   Exp.Date: 11/28/15 Drug name: metoprolol tartrate Qty: 1  LOT: [id number]   Exp.Date: 10/2015  Collaborated with Dr. Ladona Ridgel for therapy plan as well as [facility] outpatient pharmacy to fill 90 DS of HTN meds, levothyroxine, and OTC meds. Prescriptions for ongoing medications were sent to [facility] pharmacy. Pill box provided and filled x 1 week. Medication(s) were reviewed with the patient, including name, instructions, indication, goals of therapy, potential side effects, importance of adherence, and safe use.   Patient verbalized understanding by repeating back information and was advised to contact me if further medication-related questions arise. Patient was also provided an information handout.  Nancy [name],Nancy [name] 4:31 PM 04/08/2015

## 2015-04-08 NOTE — Progress Notes (Signed)
Patient ID: Nancy [name], female   DOB: 24-May-1955, 60 y.o.   MRN: [id number]   Subjective:   Patient ID: Nancy [name] female   DOB: 04-25-1955 60 y.o.   MRN: [id number]  HPI: [name] is a 60 y.o. female with PMH as below, here for evaluation of itchy rash.  Please see Problem-Based charting for the status of the patient's chronic medical issues.  Patient reports new onset rash, starting Friday. She first reports tiny bumps on her hands, that by Friday night had become red and itchy.  It then spread to her arms and legs and face and butt.  She has moderate, short last relief with Calamine lotion.  She denies changes in soap or detergent. She denies new medications. She has been applying Eucerin lotion and Triamcinolone 0.025%.  She also reports HA, occurring intermittently.  She says she has been taking her medications, but pharmacy review shows they have not been filled since October.  She denies CP.  She has intermittent SOB that has not worsened.   Past Medical History  Diagnosis Date  . Primary Sjogren's syndrome (HCC)     anti Ro+;ANA>1:1280 in homogen pattern, negative ds DNA/RF/anti Smith/RNP/C3-4 comp/la/jo1/Scleroderma/centromere, neg HIV/ACE/Hep B/C, nl CXR 2/05, schirmer salivary glandtest not done, symptom rx:eye drops/prednisone/plaquenil referral to [facility] rheum, [name] 3/07. saw [name] but stopped 2/2 cost.  . Hypertension     no LVH EKG-7/05, nl M/C ratio 4/07  . Morbid obesity (HCC)   . Back pain     chronic low back pain L4-5 discectomy:11/99. degenerative thoracic spondylotic changes 4/07  . Obstructive sleep apnea     severe 7/05 RD 161 per hr. /CPAP 18cwp  . Hypothyroidism     09/1998  . Microcytic anemia     09/1998. Hb-10.5/MCV 76. needs ferritin to determine ACD vs IDA  . Menorrhagia   . Uterine bleeding   . Lower extremity edema     echo EF 55-65% w/o evidence of Dias dysfx.,   . Polyarthralgia     (knee/back/ankle) w/dx of  fibromyalgia? given by [facility] rheum, knee pain chronic 2/2 obesity, fibromyalgia and Sjogren's.  . Chest pain     neg adenosine myoview.   Current Outpatient Prescriptions  Medication Sig Dispense Refill  . acetaminophen-codeine (TYLENOL #3) 300-30 MG tablet Take 1 tablet by mouth every 4 (four) hours as needed for severe pain. 20 tablet 0  . chlorthalidone (HYGROTON) 25 MG tablet Take 1 tablet (25 mg total) by mouth daily. 60 tablet 2  . clobetasol ointment (TEMOVATE) 0.05 % Apply 1 application topically 2 (two) times daily. 30 g 0  . diclofenac sodium (VOLTAREN) 1 % GEL Apply 4 g topically 4 (four) times daily. 100 g 3  . ferrous sulfate 325 (65 FE) MG tablet Take 1 tablet (325 mg total) by mouth 3 (three) times daily with meals. 90 tablet 2  . fluocinolone (VANOS) 0.01 % cream Apply topically 2 (two) times daily. 30 g 0  . guaiFENesin (MUCINEX) 600 MG 12 hr tablet Take 1 tablet (600 mg total) by mouth 2 (two) times daily. 30 tablet 0  . hydrOXYzine (ATARAX/VISTARIL) 50 MG tablet Take 1 tablet (50 mg total) by mouth 3 (three) times daily as needed. 30 tablet 0  . lisinopril (PRINIVIL,ZESTRIL) 40 MG tablet Take 1 tablet (40 mg total) by mouth daily. 30 tablet 11  . metoprolol (LOPRESSOR) 50 MG tablet Take 1 tablet (50 mg total) by mouth 2 (two) times daily. 60 tablet  11  . nystatin cream (MYCOSTATIN) Apply to affected area 2 times daily 30 g 0  . PROAIR HFA 108 (90 BASE) MCG/ACT inhaler INHALE 1 PUFF INTO THE LUNGS EVERY 6 (SIX) HOURS AS NEEDED FOR WHEEZING. 8.5 each 2  . triamcinolone (KENALOG) 0.025 % ointment Apply 1 application topically 2 (two) times daily. Do not apply to face 15 g 1   No current facility-administered medications for this visit.   Family History  Problem Relation Age of Onset  . Hypertension Mother   . Cancer Mother   . Cancer Father   . Cancer Sister   . Ovarian cancer      family history  . Breast cancer      family history in 1st degree relatives.  . Thyroid  disease Sister   . Colon cancer Neg Hx   . Stomach cancer Neg Hx    Social History   Social History  . Marital Status: Married    Spouse Name: N/A  . Number of Children: N/A  . Years of Education: N/A   Occupational History  . house wife    Social History Main Topics  . Smoking status: Never Smoker   . Smokeless tobacco: Never Used  . Alcohol Use: No  . Drug Use: No  . Sexual Activity:    Partners: Male    Birth Control/ Protection: Post-menopausal   Other Topics Concern  . None   Social History Narrative   Her daughter [name]) often comes with her to visits. She also has a grand daughter and great grand daughter(whose name is Stark Bray)   Review of Systems: Pertinent items are noted in HPI. Objective:  Physical Exam: Filed Vitals:   04/08/15 1033  BP: 194/90  Pulse: 81  Temp: 98 F (36.7 C)  TempSrc: Oral  Height:  (1.6 m)  Weight: 255 lb 11.2 oz (115.985 kg)  SpO2: 97%   Physical Exam  Constitutional: She is oriented to person, place, and time. No distress.  Obese female, sitting in chair, uncomfortable appearing, constantly scratching arms.   HENT:  Head: Normocephalic and atraumatic.  Eyes: EOM are normal. No scleral icterus.  Neck: No tracheal deviation present.  Cardiovascular: Normal rate, regular rhythm and normal heart sounds.   Pulmonary/Chest: Effort normal and breath sounds normal. No stridor. No respiratory distress. She has no wheezes.  Musculoskeletal: She exhibits no edema.  Neurological: She is alert and oriented to person, place, and time.  Skin: Skin is warm and dry. She is not diaphoretic.  Erythematous, follicular based papules coalescing into plaques on anterior and dorsal arms, not extending above short sleeve line.  Dorsal hands with nonpitting edema and erythema.  Smaller plaques appreciated on anterior thigh.  No involvement of pannus or buttock appreciated.  Similar, resolving papules appreciated on face in malar distribution.      Assessment & Plan:   Patient and case were discussed with Dr. Josem Kaufmann.  Please refer to Problem Based charting for further documentation.

## 2015-04-08 NOTE — Assessment & Plan Note (Addendum)
Etiology of patient's rash is unclear.  Xerosis is a common symptom of Sjogren's syndrome, but the prominent erythema is inconsistent with simple xerosis.  Sjogren's patient also at increased risk of thyroid disorder, PBC, and renal dysfunction.  She has known hypothyroidism and is untreated due to medication nonadherence.  However, the rash is most prominent on her arms >> legs, face and is not present in a generalized distribution.  Patient is in obvious distress with the itching, so will provide short-term symptomatic relief and recheck in future.  Will also refer to Rheum, as patient will need follow up and management of Sjogren's with possible cutaneous manifestation.  Dermatology referral should also be considered at next visit if no improvement.  Patient was counseled on side effects of topical steroid use.  - Clobetasol ointment to arms and legs, BID x7 days. Do not apply to face/folds. - Fluocinolone cream to folds and face BID x7 days.   - Hydroxyzine 50 mg TID - Rheumatology referral. - CBC/diff - BMP - LFTs

## 2015-04-08 NOTE — Progress Notes (Signed)
I saw and evaluated the patient.  I personally confirmed the key portions of Dr. Taylor's history and exam and reviewed pertinent patient test results.  The assessment, diagnosis, and plan were formulated together and I agree with the documentation in the resident's note. 

## 2015-04-08 NOTE — Assessment & Plan Note (Addendum)
Patient's TSH high and T4 low at last visit.  However, pharmacy review shows patient has not filled Synthroid since May.  Will restart synthroid and recheck TSH in 6 weeks. - Continue Synthroid 50 mcg

## 2015-04-08 NOTE — Assessment & Plan Note (Signed)
BP Readings from Last 3 Encounters:  04/08/15 194/90  12/18/14 171/89  12/11/14 183/91    Lab Results  Component Value Date   NA 139 12/04/2014   K 3.8 12/04/2014   CREATININE 1.00 12/04/2014    Assessment: Blood pressure control:  poor Progress toward BP goal:    Comments: Patient states she is taking her medications, but pharmacy review shows she has not picked up her medications since October. She is only taking Chlorthalidone 25 mg and has a h/o hypokalemia on Chlorthalidone 50 mg.  Will change medications and recheck BP.  Plan: Medications:  Chlorthalidone 25 mg, Metoprolol 50 mg BID, Lisinopril 40 mg Educational resources provided:   Self management tools provided: home blood pressure logbook Other plans: recheck BP at next visit. Consider consolidation of pills.

## 2015-04-09 LAB — CBC WITH DIFFERENTIAL/PLATELET
Basophils Absolute: 0 10*3/uL (ref 0.0–0.2)
Basos: 0 %
EOS (ABSOLUTE): 0.4 10*3/uL (ref 0.0–0.4)
Eos: 9 %
Hematocrit: 34.8 % (ref 34.0–46.6)
Hemoglobin: 10.5 g/dL — ABNORMAL LOW (ref 11.1–15.9)
Immature Grans (Abs): 0 10*3/uL (ref 0.0–0.1)
Immature Granulocytes: 0 %
Lymphocytes Absolute: 1.6 10*3/uL (ref 0.7–3.1)
Lymphs: 41 %
MCH: 23.1 pg — ABNORMAL LOW (ref 26.6–33.0)
MCHC: 30.2 g/dL — ABNORMAL LOW (ref 31.5–35.7)
MCV: 77 fL — ABNORMAL LOW (ref 79–97)
Monocytes Absolute: 0.3 10*3/uL (ref 0.1–0.9)
Monocytes: 7 %
Neutrophils Absolute: 1.7 10*3/uL (ref 1.4–7.0)
Neutrophils: 43 %
Platelets: 152 10*3/uL (ref 150–379)
RBC: 4.54 x10E6/uL (ref 3.77–5.28)
RDW: 16.3 % — ABNORMAL HIGH (ref 12.3–15.4)
WBC: 3.8 10*3/uL (ref 3.4–10.8)

## 2015-04-09 LAB — BMP8+ANION GAP
Anion Gap: 15 mmol/L (ref 10.0–18.0)
BUN/Creatinine Ratio: 10 — ABNORMAL LOW (ref 11–26)
BUN: 8 mg/dL (ref 8–27)
CO2: 24 mmol/L (ref 18–29)
Calcium: 8.6 mg/dL — ABNORMAL LOW (ref 8.7–10.3)
Chloride: 100 mmol/L (ref 96–106)
Creatinine, Ser: 0.8 mg/dL (ref 0.57–1.00)
GFR calc Af Amer: 91 mL/min/{1.73_m2}
GFR calc non Af Amer: 79 mL/min/{1.73_m2}
Glucose: 93 mg/dL (ref 65–99)
Potassium: 3.9 mmol/L (ref 3.5–5.2)
Sodium: 139 mmol/L (ref 134–144)

## 2015-04-09 LAB — HEPATIC FUNCTION PANEL
ALT: 8 [IU]/L (ref 0–32)
AST: 20 [IU]/L (ref 0–40)
Albumin: 3.8 g/dL (ref 3.6–4.8)
Alkaline Phosphatase: 62 [IU]/L (ref 39–117)
Bilirubin Total: 0.6 mg/dL (ref 0.0–1.2)
Bilirubin, Direct: 0.17 mg/dL (ref 0.00–0.40)
Total Protein: 7.5 g/dL (ref 6.0–8.5)

## 2015-04-09 LAB — FERRITIN: Ferritin: 163 ng/mL — ABNORMAL HIGH (ref 15–150)

## 2015-04-09 NOTE — Addendum Note (Signed)
Addended by: Venia Minks A on: 04/09/2015 08:40 AM   Modules accepted: Orders

## 2015-04-10 LAB — SPECIMEN STATUS REPORT

## 2015-04-10 LAB — IRON: Iron: 61 ug/dL (ref 27–139)

## 2015-04-16 ENCOUNTER — Encounter: Payer: Self-pay | Admitting: Internal Medicine

## 2015-04-16 ENCOUNTER — Ambulatory Visit (INDEPENDENT_AMBULATORY_CARE_PROVIDER_SITE_OTHER): Payer: Medicare HMO | Admitting: Internal Medicine

## 2015-04-16 ENCOUNTER — Ambulatory Visit (HOSPITAL_COMMUNITY)
Admission: RE | Admit: 2015-04-16 | Discharge: 2015-04-16 | Disposition: A | Discharge status: Home / Self Care | Payer: Medicare HMO | Source: Ambulatory Visit | Attending: Internal Medicine | Admitting: Internal Medicine

## 2015-04-16 ENCOUNTER — Encounter (INDEPENDENT_AMBULATORY_CARE_PROVIDER_SITE_OTHER): Payer: Self-pay

## 2015-04-16 VITALS — BP 159/83 | HR 80 | Temp 98.1°F | Ht 63.0 in | Wt 256.0 lb

## 2015-04-16 DIAGNOSIS — M179 Osteoarthritis of knee, unspecified: Secondary | ICD-10-CM | POA: Diagnosis not present

## 2015-04-16 DIAGNOSIS — E039 Hypothyroidism, unspecified: Secondary | ICD-10-CM

## 2015-04-16 DIAGNOSIS — G8929 Other chronic pain: Secondary | ICD-10-CM

## 2015-04-16 DIAGNOSIS — M25561 Pain in right knee: Secondary | ICD-10-CM

## 2015-04-16 DIAGNOSIS — M25562 Pain in left knee: Secondary | ICD-10-CM | POA: Diagnosis present

## 2015-04-16 DIAGNOSIS — M1712 Unilateral primary osteoarthritis, left knee: Secondary | ICD-10-CM | POA: Diagnosis not present

## 2015-04-16 DIAGNOSIS — I10 Essential (primary) hypertension: Secondary | ICD-10-CM | POA: Diagnosis not present

## 2015-04-16 DIAGNOSIS — Z0000 Encounter for general adult medical examination without abnormal findings: Secondary | ICD-10-CM

## 2015-04-16 DIAGNOSIS — Z6841 Body Mass Index (BMI) 40.0 and over, adult: Secondary | ICD-10-CM

## 2015-04-16 MED ORDER — ACETAMINOPHEN-CODEINE #3 300-30 MG PO TABS: 1.0000 | ORAL_TABLET | ORAL | Status: DC | PRN

## 2015-04-16 MED ORDER — DICLOFENAC SODIUM 1 % TD GEL: 4.0000 g | Freq: Four times a day (QID) | TRANSDERMAL | Status: DC

## 2015-04-16 NOTE — Assessment & Plan Note (Signed)
-   restarted on synthroid on last visit - patient states she is compliant with meds - Will recheck TSH next month at follow up

## 2015-04-16 NOTE — Patient Instructions (Signed)
-   Will check left knee x ray today - I have prescribed voltaren gel to use in addition to your tylenol 3 for your pain - Please f/u next month so we can check your thyroid function - Please call with any issues  - Please bring your blood pressure log for your next visit

## 2015-04-16 NOTE — Assessment & Plan Note (Signed)
BP Readings from Last 3 Encounters:  04/16/15 159/83  04/08/15 194/90  12/18/14 171/89    Lab Results  Component Value Date   NA 139 04/08/2015   K 3.9 04/08/2015   CREATININE 0.80 04/08/2015    Assessment: Blood pressure control:  fair Progress toward BP goal:   improved Comments: patient has not taken meds this AM but states she is compliant with them and will take them once she goes home  Plan: Medications:  c/w lisinopril, chlorthalidone and metoprolol Educational resources provided: brochure (deies) Self management tools provided: home blood pressure logbook Other plans: patient to bring logbook on next visit

## 2015-04-16 NOTE — Assessment & Plan Note (Signed)
-   likely contributing to her knee pain and OA - Will discuss in detail on next visit in March

## 2015-04-16 NOTE — Progress Notes (Signed)
   Subjective:    Patient ID: Nancy [name], female    DOB: 01-29-1956, 60 y.o.   MRN: [id number]  HPI Patient seen and examined. She is here for routine follow up of her knee and back pain as well as her HTN.   Patient states her knee pain and back pain have been relatively well controlled prior to today but that the pain meds are not as effective as they used to be. She states today while she was getting ready to come in she heard a "pop" in her left knee and has had worsening left knee and back pain since then. She describes the pain as intermittent, sharp, shooting. She is able to bear weight. No bowel/bladder incontinence, no loss of sensation, power is intact, no fevers.   She states she has picked up her prescriptions for her BP meds and has been compliant with them. Did not take them today though as they sometimes make her sleepy and she had to drive today . Will take them once she goes home   Review of Systems  Constitutional: Negative.   HENT: Negative.   Respiratory: Negative.   Cardiovascular: Negative.   Gastrointestinal: Negative.   Musculoskeletal: Positive for arthralgias and gait problem.       Patient with left knee pain and difficulty ambulating secondary to pain  Skin: Negative.   Neurological: Negative.   Psychiatric/Behavioral: Negative.        Objective:   Physical Exam  Constitutional: She is oriented to person, place, and time. She appears well-developed and well-nourished.  HENT:  Head: Normocephalic and atraumatic.  Cardiovascular: Normal rate and regular rhythm.   Pulmonary/Chest: Effort normal and breath sounds normal. No respiratory distress. She has no wheezes.  Abdominal: Soft. Bowel sounds are normal. She exhibits no distension. There is no tenderness.  Musculoskeletal: She exhibits tenderness. She exhibits no edema.  Patient with mild tenderness over medial and lateral left knee with ROM limited by pain but still able to extend leg and bear weight    Neurological: She is alert and oriented to person, place, and time.  Skin: Skin is warm and dry. No rash noted. No erythema.  Psychiatric: She has a normal mood and affect. Her behavior is normal.          Assessment & Plan:  Please refer to problem based charting for assessment and plan:

## 2015-04-16 NOTE — Assessment & Plan Note (Signed)
-   patient continues to refuse flu shot.

## 2015-04-16 NOTE — Assessment & Plan Note (Signed)
-   patient with bilateral chronic knee pain with acute worsening of left knee pain today after "popping" nouise while getting ready - Able to bear weight but has ROM limited by pain - I believe this pain is related to her OA.  - Will c/w pain control with tylenol 3 and started on voltaren gel - x ray with no acute path- degenerative changes + - She may benefit from intraarticular injections if pain persists - Patient has made an appointment for rheum follow up on Friday

## 2015-04-29 DIAGNOSIS — M3500 Sicca syndrome, unspecified: Secondary | ICD-10-CM | POA: Diagnosis not present

## 2015-04-29 DIAGNOSIS — M150 Primary generalized (osteo)arthritis: Secondary | ICD-10-CM | POA: Diagnosis not present

## 2015-04-29 DIAGNOSIS — M2550 Pain in unspecified joint: Secondary | ICD-10-CM | POA: Diagnosis not present

## 2015-04-29 DIAGNOSIS — M5136 Other intervertebral disc degeneration, lumbar region: Secondary | ICD-10-CM | POA: Diagnosis not present

## 2015-04-29 DIAGNOSIS — Z6841 Body Mass Index (BMI) 40.0 and over, adult: Secondary | ICD-10-CM | POA: Diagnosis not present

## 2015-05-28 ENCOUNTER — Encounter: Payer: Self-pay | Admitting: Internal Medicine

## 2015-05-28 ENCOUNTER — Ambulatory Visit (INDEPENDENT_AMBULATORY_CARE_PROVIDER_SITE_OTHER): Payer: Medicare HMO | Admitting: Internal Medicine

## 2015-05-28 VITALS — BP 130/65 | HR 72 | Temp 98.3°F | Ht 63.0 in | Wt 255.6 lb

## 2015-05-28 DIAGNOSIS — I10 Essential (primary) hypertension: Secondary | ICD-10-CM | POA: Diagnosis not present

## 2015-05-28 DIAGNOSIS — G8929 Other chronic pain: Secondary | ICD-10-CM | POA: Diagnosis not present

## 2015-05-28 DIAGNOSIS — M25569 Pain in unspecified knee: Secondary | ICD-10-CM | POA: Diagnosis not present

## 2015-05-28 DIAGNOSIS — E039 Hypothyroidism, unspecified: Secondary | ICD-10-CM

## 2015-05-28 DIAGNOSIS — Z6841 Body Mass Index (BMI) 40.0 and over, adult: Secondary | ICD-10-CM

## 2015-05-28 DIAGNOSIS — M25562 Pain in left knee: Secondary | ICD-10-CM

## 2015-05-28 DIAGNOSIS — M25561 Pain in right knee: Secondary | ICD-10-CM

## 2015-05-28 NOTE — Assessment & Plan Note (Signed)
-   Discussed the importance of weight loss extensively with patient - Will refer to [facility] for nutrition counseling - Encouraged patient to walk 30 min/day - Patient expresses understanding and motivation to try and lose weight - Will f/u on next visit

## 2015-05-28 NOTE — Assessment & Plan Note (Signed)
-   Knee pain is better controlled after starting the voltaren gel - Will c/w voltaren and tylenol 3 prn - If recurrent pain may benefit from corticosteroid injection in knee - Will monitor - counseled patient on weight loss

## 2015-05-28 NOTE — Assessment & Plan Note (Signed)
-   TSH was midly elevated 6 months ago with a normal free T4 - Will check repeat TSH today - Patient is complaint with synthroid 50 micrograms - Will consider increasing it if TSH remains elevated - No neck pain, no constipation, no cold intolerance. Does complain of mild fatigue

## 2015-05-28 NOTE — Patient Instructions (Signed)
-   It was a pleasure seeing you today - Please follow up in 3 months - I have put in a referral to nutrition counseling. They will call to schedule an appointment for you - We will check your thyroid function today - Your BP is well controlled - Continue with current management for your pain - Please try to walk 30 min/day  Exercising to Lose Weight Exercising can help you to lose weight. In order to lose weight through exercise, you need to do vigorous-intensity exercise. You can tell that you are exercising with vigorous intensity if you are breathing very hard and fast and cannot hold a conversation while exercising. Moderate-intensity exercise helps to maintain your current weight. You can tell that you are exercising at a moderate level if you have a higher heart rate and faster breathing, but you are still able to hold a conversation. HOW OFTEN SHOULD I EXERCISE? Choose an activity that you enjoy and set realistic goals. Your health care provider can help you to make an activity plan that works for you. Exercise regularly as directed by your health care provider. This may include:  Doing resistance training twice each week, such as:  Push-ups.  Sit-ups.  Lifting weights.  Using resistance bands.  Doing a given intensity of exercise for a given amount of time. Choose from these options:  150 minutes of moderate-intensity exercise every week.  75 minutes of vigorous-intensity exercise every week.  A mix of moderate-intensity and vigorous-intensity exercise every week. Children, pregnant women, people who are out of shape, people who are overweight, and older adults may need to consult a health care provider for individual recommendations. If you have any sort of medical condition, be sure to consult your health care provider before starting a new exercise program. WHAT ARE SOME ACTIVITIES THAT CAN HELP ME TO LOSE WEIGHT?   Walking at a rate of at least 4.5 miles an  hour.  Jogging or running at a rate of 5 miles per hour.  Biking at a rate of at least 10 miles per hour.  Lap swimming.  Roller-skating or in-line skating.  Cross-country skiing.  Vigorous competitive sports, such as football, basketball, and soccer.  Jumping rope.  Aerobic dancing. HOW CAN I BE MORE ACTIVE IN MY DAY-TO-DAY ACTIVITIES?  Use the stairs instead of the elevator.  Take a walk during your lunch break.  If you drive, park your car farther away from work or school.  If you take public transportation, get off one stop early and walk the rest of the way.  Make all of your phone calls while standing up and walking around.  Get up, stretch, and walk around every 30 minutes throughout the day. WHAT GUIDELINES SHOULD I FOLLOW WHILE EXERCISING?  Do not exercise so much that you hurt yourself, feel dizzy, or get very short of breath.  Consult your health care provider prior to starting a new exercise program.  Wear comfortable clothes and shoes with good support.  Drink plenty of water while you exercise to prevent dehydration or heat stroke. Body water is lost during exercise and must be replaced.  Work out until you breathe faster and your heart beats faster.   This information is not intended to replace advice given to you by your health care provider. Make sure you discuss any questions you have with your health care provider.   Document Released: 03/28/2010 Document Revised: 03/16/2014 Document Reviewed: 07/27/2013 Elsevier Interactive Patient Education [employer]2016 [employer] Inc.

## 2015-05-28 NOTE — Progress Notes (Signed)
   Subjective:    Patient ID: Nancy [name], female    DOB: 02/09/1956, 60 y.o.   MRN: [id number]  HPI Patient seen and examined. She presents today for follow up of her hypothyroidism and knee pain. States her knee pain has improved since starting voltaren gel on last visit in addition to her tylenol 3. She states her knees do give out intermittently though.  We also discussed the importance of weight loss and the need to exercise daily and follow a healthy diet. Patient does complain of dry skin and mild pruritis over hands and legs. Advised using moisturizing cream twice a day.    Review of Systems  Constitutional: Negative.   HENT: Negative.   Eyes: Negative.   Respiratory: Negative.   Cardiovascular: Negative.   Gastrointestinal: Negative.   Musculoskeletal: Positive for arthralgias and gait problem.  Skin: Negative.   Neurological: Negative.   Psychiatric/Behavioral: Negative.        Objective:   Physical Exam  Constitutional: She is oriented to person, place, and time. She appears well-developed and well-nourished.  HENT:  Head: Normocephalic and atraumatic.  Cardiovascular: Normal rate, regular rhythm and normal heart sounds.   Pulmonary/Chest: Effort normal and breath sounds normal. No respiratory distress. She has no wheezes.  Abdominal: Soft. Bowel sounds are normal. She exhibits no distension. There is no tenderness.  Musculoskeletal: Normal range of motion. She exhibits no edema or tenderness.  Neurological: She is alert and oriented to person, place, and time.  Skin: Skin is warm and dry. No rash noted.  Psychiatric: She has a normal mood and affect. Her behavior is normal.          Assessment & Plan:  Please see problem based charting for assessment and plan:

## 2015-05-28 NOTE — Assessment & Plan Note (Signed)
BP Readings from Last 3 Encounters:  05/28/15 130/65  04/16/15 159/83  04/08/15 194/90    Lab Results  Component Value Date   NA 139 04/08/2015   K 3.9 04/08/2015   CREATININE 0.80 04/08/2015    Assessment: Blood pressure control:  well controlled Progress toward BP goal:   at goal             Comments: patient is compliant with meds (chlorthalidone, metoprolol, lisinopril)  Plan: Medications:  continue current medications Educational resources provided: brochure (denies need ) Self management tools provided:   Other plans:

## 2015-05-29 LAB — TSH: TSH: 3.58 u[IU]/mL (ref 0.450–4.500)

## 2015-06-19 ENCOUNTER — Ambulatory Visit: Payer: Medicare HMO | Admitting: Dietician

## 2015-06-19 ENCOUNTER — Telehealth: Payer: Self-pay | Admitting: Dietician

## 2015-06-25 NOTE — Telephone Encounter (Signed)
Voicemail box not set up. Will ask front office to assist with rescheduling.

## 2015-07-03 NOTE — Addendum Note (Signed)
Addended by: Neomia DearPOWERS, Augustina Braddock E on: 07/03/2015 07:17 PM   Modules accepted: Orders

## 2015-08-08 ENCOUNTER — Encounter: Payer: Self-pay | Admitting: Internal Medicine

## 2015-08-08 ENCOUNTER — Ambulatory Visit (INDEPENDENT_AMBULATORY_CARE_PROVIDER_SITE_OTHER): Payer: Medicare HMO | Admitting: Internal Medicine

## 2015-08-08 VITALS — BP 213/96 | HR 76 | Temp 98.1°F | Ht 63.0 in | Wt 256.3 lb

## 2015-08-08 DIAGNOSIS — M25552 Pain in left hip: Secondary | ICD-10-CM | POA: Diagnosis not present

## 2015-08-08 DIAGNOSIS — M25561 Pain in right knee: Secondary | ICD-10-CM

## 2015-08-08 DIAGNOSIS — G8929 Other chronic pain: Secondary | ICD-10-CM | POA: Diagnosis not present

## 2015-08-08 DIAGNOSIS — Z9181 History of falling: Secondary | ICD-10-CM

## 2015-08-08 DIAGNOSIS — M545 Low back pain, unspecified: Secondary | ICD-10-CM

## 2015-08-08 DIAGNOSIS — I10 Essential (primary) hypertension: Secondary | ICD-10-CM

## 2015-08-08 DIAGNOSIS — M25562 Pain in left knee: Secondary | ICD-10-CM | POA: Diagnosis not present

## 2015-08-08 MED ORDER — CYCLOBENZAPRINE HCL 5 MG PO TABS: 5.0000 mg | ORAL_TABLET | Freq: Three times a day (TID) | ORAL | Status: DC | PRN

## 2015-08-08 NOTE — Assessment & Plan Note (Signed)
Once again another episode of worsening of her chronic low back/hip pain, likely musculoligamentous strain at this point given her exam and history as mentioned above. She has no red flag symptoms at this time and I do not think imaging is indicated currently. -Continue Tylenol 3 -Instructed patient to apply Voltaren gel to the affected area as well as intermittent warm compresses -Exercises tolerated -Prescribed short course of Flexeril 2 weeks to help with muscle spasms -Counseled patient that this may take several weeks to improve. Instructed patient to notify us if her symptoms worsen or she is not improving over the next few weeks. -Already has an appointment with Dr. Heide SparkNarendra on 08/20/2015, will follow-up at that visit

## 2015-08-08 NOTE — Progress Notes (Signed)
Internal Medicine Clinic Attending  Case discussed with Dr. Kennedy at the time of the visit.  We reviewed the resident's history and exam and pertinent patient test results.  I agree with the assessment, diagnosis, and plan of care documented in the resident's note.  

## 2015-08-08 NOTE — Assessment & Plan Note (Signed)
Has acute worsening of her chronic knee pain on the left side with mild increase in swelling without other concerning symptoms such as point tenderness, redness, or warmth. Patient says her symptoms are slowly improving. Most likely exacerbation of her chronic osteoarthritis due to morbid obesity. No signs of fracture that would indicate imaging at this time -Continue Tylenol 3 and Voltaren gel as needed -Instructed patient on warm compresses as well -Exercises tolerated -Follow-up in clinic already for June 13, continue to monitor

## 2015-08-08 NOTE — Assessment & Plan Note (Signed)
BP Readings from Last 3 Encounters:  08/08/15 213/96  05/28/15 130/65  04/16/15 159/83    Lab Results  Component Value Date   NA 139 04/08/2015   K 3.9 04/08/2015   CREATININE 0.80 04/08/2015    Assessment: Blood pressure control:  markedly elevated today, due to patient not taking BP medications. Recheck today was 190 systolic. Patient asymptomatic Progress toward BP goal:   deteriorated, although overall control has been good. Likely due to forgetting to take medication this morning  Plan: Medications:  continue current medications Educational resources provided: other (see comments) (DECLINED) Self management tools provided: other (see comments) (DECLINED) Other plans: Will recheck in 2 weeks. Instructed patient on multiple occasions to take BP meds in the morning before her doctor's appointment (and every day)

## 2015-08-08 NOTE — Patient Instructions (Signed)
Mrs. Bruna Pottereague,  For your knee and back, continue to take Tylenol No. 3 and use the Voltaren gel both on your back and on your knee. In addition, use warm compresses, especially on your back, on and off throughout the day. This will help with swelling and spasm. Besides the warm compresses, I have prescribed a short course of a medicine that will help relax your muscle spasms. Only take these if you need them. We do not believe you have broken your knee or hip, but the pain may take another 3-4 weeks to go away. Come back and see us in 4 weeks to see how you're doing. Call us beforehand if you have any other issues.  Thanks, Reubin MilanBilly Kavi Almquist

## 2015-08-08 NOTE — Progress Notes (Signed)
   Patient ID: Nancy [name] female   DOB: [birth year] 60 y.o.   MRN: [id number]  Subjective:   HPI: [name] is a 60 y.o. with PMH of Morbid obesity, HTN, hypothyroidism, and chronic knee and low back pain who presents to [facility] today for evaluation of acute worsening of her chronic left knee pain as well as left hip pain after a fall 2 weeks ago. She says that 2 weeks ago, she was working the kitchen when she slipped on a rug and fell on her side. Initially, her knee was sore and swollen but she was able to move it fine without any other symptoms. She says over the past 2 weeks, with Voltaren gel and Tylenol 3 that she's been using, the swelling has slowly decreased and the aching pain is slowly getting better. However, she has noted a knot on her left low back just above her buttocks that comes in spasms of sharp pain that sometimes radiates to above the knee. She denies any other symptoms such as numbness or tingling or urinary or bowel changes. She has not tried anything for the hip pain except for Tylenol No. 3. She has no other symptoms at this time.   Please see problem-based charting for status of medical issues pertinent to this visit.  Review of Systems: Pertinent items noted in HPI and remainder of comprehensive ROS otherwise negative.  Objective:  Physical Exam: Filed Vitals:   08/08/15 0843  BP: 213/96  Pulse: 76  Temp: 98.1 F (36.7 C)  TempSrc: Oral  Height: 5\' 3"  (1.6 m)  Weight: 256 lb 4.8 oz (116.257 kg)  SpO2: 100%   Gen: Well-appearing, alert and oriented to person, place, and time HEENT: Oropharynx clear without erythema or exudate.  Neck: No cervical LAD, no thyromegaly or nodules, no JVD noted. CV: Normal rate, regular rhythm, no murmurs, rubs, or gallops Pulmonary: Normal effort, CTA bilaterally, no wheezing, rales, or rhonchi Abdominal: Soft, non-tender, non-distended, without rebound, guarding, or masses MSK: Distal pulses 2+ in upper and lower  extremities bilaterally. Left knee is slightly more swollen compared to the right, without increased redness or warmth. Knee is nontender R OM both passive and active not prohibited by pain. There is a small 2 cm knot above the left buttocks. Nontender to palpation. Neurovascular exam normal. Normal back R OM. Straight leg test negative. Skin: No atypical appearing moles. No rashes   Assessment & Plan:  Please see problem-based charting for assessment and plan.  Reubin Milan, MD Resident Physician, PGY-1 Department of Internal Medicine [facility]

## 2015-08-20 ENCOUNTER — Encounter: Payer: Self-pay | Admitting: Licensed Clinical Social Worker

## 2015-08-20 ENCOUNTER — Encounter: Payer: Self-pay | Admitting: Internal Medicine

## 2015-08-20 ENCOUNTER — Ambulatory Visit (INDEPENDENT_AMBULATORY_CARE_PROVIDER_SITE_OTHER): Payer: Medicare HMO | Admitting: Internal Medicine

## 2015-08-20 DIAGNOSIS — G8929 Other chronic pain: Secondary | ICD-10-CM | POA: Diagnosis not present

## 2015-08-20 DIAGNOSIS — M25562 Pain in left knee: Secondary | ICD-10-CM | POA: Diagnosis not present

## 2015-08-20 DIAGNOSIS — M545 Low back pain, unspecified: Secondary | ICD-10-CM

## 2015-08-20 DIAGNOSIS — M25561 Pain in right knee: Secondary | ICD-10-CM

## 2015-08-20 DIAGNOSIS — I10 Essential (primary) hypertension: Secondary | ICD-10-CM

## 2015-08-20 DIAGNOSIS — Z79899 Other long term (current) drug therapy: Secondary | ICD-10-CM

## 2015-08-20 DIAGNOSIS — E039 Hypothyroidism, unspecified: Secondary | ICD-10-CM

## 2015-08-20 MED ORDER — ACETAMINOPHEN-CODEINE #3 300-30 MG PO TABS: 1.0000 | ORAL_TABLET | ORAL | Status: DC | PRN

## 2015-08-20 NOTE — Assessment & Plan Note (Signed)
-   compliant with synthroid 50 micrograms - TSH wnl on last visit

## 2015-08-20 NOTE — Assessment & Plan Note (Signed)
BP Readings from Last 3 Encounters:  08/20/15 157/82  08/08/15 213/96  05/28/15 130/65    Lab Results  Component Value Date   NA 139 04/08/2015   K 3.9 04/08/2015   CREATININE 0.80 04/08/2015    Assessment: Blood pressure control:  fair Progress toward BP goal:   improved Comments: compliant with chlorthalidone, metoprolol and lisinopril  Plan: Medications:  continue current medications Educational resources provided:   Self management tools provided: home blood pressure logbook Other plans: Patient to check BP at home and bring BP log on next visit

## 2015-08-20 NOTE — Progress Notes (Signed)
   Subjective:    Patient ID: Nancy [name], female    DOB: 11/24/1955, [age] y.o.   MRN: [id number]  HPI  Patient seen and examined. She is here for routine follow up of her chronic knee and back pain and HTN. Patient states she had a fall a couple of weeks ago and that her knee gave out when she stood up. Since the fall she has noted mildly worsening back and knee pains. No weight loss, no fevers, no bowel/bladder incontinence. She does have limited ROM in left knee secondary to pain. [facility] Dr[facility] Kyung RuddKennedy who prescribed a short course of flexeril for her back pain which imp[roved the pain. Patient is complaint with her BP meds.   Review of Systems  Constitutional: Negative.   HENT: Negative.   Respiratory: Negative.   Cardiovascular: Negative.   Gastrointestinal: Negative.   Musculoskeletal: Positive for back pain, arthralgias and gait problem. Negative for myalgias and joint swelling.  Skin: Negative.   Neurological: Negative.   Psychiatric/Behavioral: Negative.        Objective:   Physical Exam  Constitutional: She is oriented to person, place, and time. She appears well-developed and well-nourished.  HENT:  Head: Normocephalic and atraumatic.  Mouth/Throat: No oropharyngeal exudate.  Neck: Normal range of motion. Neck supple.  Cardiovascular: Normal rate, regular rhythm and normal heart sounds.   Pulmonary/Chest: Effort normal and breath sounds normal. She has no wheezes. She has no rales.  Abdominal: Soft. Bowel sounds are normal. She exhibits no distension. There is no tenderness.  Musculoskeletal: Normal range of motion.  Trace b/l LE edema ROM limited on left lower extremity by pain in her knee  Lymphadenopathy:    She has no cervical adenopathy.  Neurological: She is alert and oriented to person, place, and time.  Skin: Skin is warm. No rash noted. No erythema.  Psychiatric: She has a normal mood and affect. Her behavior is normal.            Assessment & Plan:  Please see problem based charting for assessment and plan:

## 2015-08-20 NOTE — Patient Instructions (Addendum)
- It was a pleasure seeing you today - We will refer you for home health PT services - I have refilled your tylenol 3 - Please follow up in 3 months  Back Pain, Adult Back pain is very common in adults.The cause of back pain is rarely dangerous and the pain often gets better over time.The cause of your back pain may not be known. Some common causes of back pain include:  Strain of the muscles or ligaments supporting the spine.  Wear and tear (degeneration) of the spinal disks.  Arthritis.  Direct injury to the back. For many people, back pain may return. Since back pain is rarely dangerous, most people can learn to manage this condition on their own. HOME CARE INSTRUCTIONS Watch your back pain for any changes. The following actions may help to lessen any discomfort you are feeling:  Remain active. It is stressful on your back to sit or stand in one place for long periods of time. Do not sit, drive, or stand in one place for more than 30 minutes at a time. Take short walks on even surfaces as soon as you are able.Try to increase the length of time you walk each day.  Exercise regularly as directed by your health care provider. Exercise helps your back heal faster. It also helps avoid future injury by keeping your muscles strong and flexible.  Do not stay in bed.Resting more than 1-2 days can delay your recovery.  Pay attention to your body when you bend and lift. The most comfortable positions are those that put less stress on your recovering back. Always use proper lifting techniques, including:  Bending your knees.  Keeping the load close to your body.  Avoiding twisting.  Find a comfortable position to sleep. Use a firm mattress and lie on your side with your knees slightly bent. If you lie on your back, put a pillow under your knees.  Avoid feeling anxious or stressed.Stress increases muscle tension and can worsen back pain.It is important to recognize when you are anxious  or stressed and learn ways to manage it, such as with exercise.  Take medicines only as directed by your health care provider. Over-the-counter medicines to reduce pain and inflammation are often the most helpful.Your health care provider may prescribe muscle relaxant drugs.These medicines help dull your pain so you can more quickly return to your normal activities and healthy exercise.  Apply ice to the injured area:  Put ice in a plastic bag.  Place a towel between your skin and the bag.  Leave the ice on for 20 minutes, 2-3 times a day for the first 2-3 days. After that, ice and heat may be alternated to reduce pain and spasms.  Maintain a healthy weight. Excess weight puts extra stress on your back and makes it difficult to maintain good posture. SEEK MEDICAL CARE IF:  You have pain that is not relieved with rest or medicine.  You have increasing pain going down into the legs or buttocks.  You have pain that does not improve in one week.  You have night pain.  You lose weight.  You have a fever or chills. SEEK IMMEDIATE MEDICAL CARE IF:   You develop new bowel or bladder control problems.  You have unusual weakness or numbness in your arms or legs.  You develop nausea or vomiting.  You develop abdominal pain.  You feel faint.   This information is not intended to replace advice given to you by your  health care provider. Make sure you discuss any questions you have with your health care provider.   Document Released: 02/23/2005 Document Revised: 03/16/2014 Document Reviewed: 06/27/2013 Elsevier Interactive Patient Education [employer].

## 2015-08-20 NOTE — Assessment & Plan Note (Signed)
-   Back pain mildy improved - She is requesting back brace for her back pain - Will have her evaluated by PT at home given recent falls and unsafe ambulation - Will c/w pain control with voltaren gel and tylenol 3

## 2015-08-20 NOTE — Assessment & Plan Note (Signed)
-   Patient with left knee pain - it is moderately well controlled on current pain regimen - Will get PT eval at home given recent fall and difficulty ambulating - Given persistent knee pain I offered her a knee injection but she refused for now - c/w tylenol 3 and voltaren gel prn - Prescriptions given for next 3 months for tylenol 3

## 2015-08-20 NOTE — Progress Notes (Signed)
CSW met with Ms. Canela to discuss referral for [facility] PT services.  Pt has [employer] PPO plan.  Ms.  Saathoff inquiring about the copayment for [facility] services.  CSW shared generally for Medicare plans there is a $0 copyament; however, CSW encouraged pt to contact [employer] customer services directly for benefits.  In addition, this worker explain referral would be sent to an in-network provider and provider will check benefits as well.  Pt states she has used [facility] in the past and has not preference of an alternate agency.  Pt aware referral will be sent to [facility], if they are unable to staff [facility] then [facility] for staffing.  Pt in agreement.  Address and phone numbers confirmed.

## 2015-08-22 DIAGNOSIS — I10 Essential (primary) hypertension: Secondary | ICD-10-CM | POA: Diagnosis not present

## 2015-08-22 DIAGNOSIS — Z7951 Long term (current) use of inhaled steroids: Secondary | ICD-10-CM | POA: Diagnosis not present

## 2015-08-22 DIAGNOSIS — Z9181 History of falling: Secondary | ICD-10-CM | POA: Diagnosis not present

## 2015-08-22 DIAGNOSIS — M545 Low back pain: Secondary | ICD-10-CM | POA: Diagnosis not present

## 2015-08-22 DIAGNOSIS — M25562 Pain in left knee: Secondary | ICD-10-CM | POA: Diagnosis not present

## 2015-08-26 DIAGNOSIS — M25562 Pain in left knee: Secondary | ICD-10-CM | POA: Diagnosis not present

## 2015-08-26 DIAGNOSIS — Z9181 History of falling: Secondary | ICD-10-CM | POA: Diagnosis not present

## 2015-08-26 DIAGNOSIS — I10 Essential (primary) hypertension: Secondary | ICD-10-CM | POA: Diagnosis not present

## 2015-08-26 DIAGNOSIS — M545 Low back pain: Secondary | ICD-10-CM | POA: Diagnosis not present

## 2015-08-26 DIAGNOSIS — Z7951 Long term (current) use of inhaled steroids: Secondary | ICD-10-CM | POA: Diagnosis not present

## 2015-08-28 ENCOUNTER — Telehealth: Payer: Self-pay | Admitting: Internal Medicine

## 2015-08-28 NOTE — Telephone Encounter (Signed)
Pt would like to talk to nurse about rash on back that is itching.

## 2015-08-29 NOTE — Telephone Encounter (Signed)
Called wed 6/21 lm for rtc Called today, no answer

## 2015-09-03 DIAGNOSIS — M545 Low back pain: Secondary | ICD-10-CM | POA: Diagnosis not present

## 2015-09-03 DIAGNOSIS — I10 Essential (primary) hypertension: Secondary | ICD-10-CM | POA: Diagnosis not present

## 2015-09-03 DIAGNOSIS — Z9181 History of falling: Secondary | ICD-10-CM | POA: Diagnosis not present

## 2015-09-03 DIAGNOSIS — M25562 Pain in left knee: Secondary | ICD-10-CM | POA: Diagnosis not present

## 2015-09-03 DIAGNOSIS — Z7951 Long term (current) use of inhaled steroids: Secondary | ICD-10-CM | POA: Diagnosis not present

## 2015-09-06 DIAGNOSIS — Z9181 History of falling: Secondary | ICD-10-CM | POA: Diagnosis not present

## 2015-09-06 DIAGNOSIS — I10 Essential (primary) hypertension: Secondary | ICD-10-CM | POA: Diagnosis not present

## 2015-09-06 DIAGNOSIS — M25562 Pain in left knee: Secondary | ICD-10-CM | POA: Diagnosis not present

## 2015-09-06 DIAGNOSIS — M545 Low back pain: Secondary | ICD-10-CM | POA: Diagnosis not present

## 2015-09-06 DIAGNOSIS — Z7951 Long term (current) use of inhaled steroids: Secondary | ICD-10-CM | POA: Diagnosis not present

## 2015-10-10 ENCOUNTER — Telehealth: Payer: Self-pay | Admitting: Internal Medicine

## 2015-10-10 NOTE — Telephone Encounter (Signed)
After reviewing Nancy [name]'s June visit and [place] narc database called Nancy [name] and reminded her that dr Heide Spark had given her 3 scripts at that visit, she states she forgot and now she remembers, call ended

## 2015-10-10 NOTE — Telephone Encounter (Signed)
Asking for refill of pain medication

## 2015-11-22 ENCOUNTER — Encounter: Payer: Self-pay | Admitting: Internal Medicine

## 2015-11-22 ENCOUNTER — Ambulatory Visit (INDEPENDENT_AMBULATORY_CARE_PROVIDER_SITE_OTHER): Payer: Medicare HMO | Admitting: Internal Medicine

## 2015-11-22 VITALS — BP 176/75 | HR 75 | Temp 98.3°F | Ht 63.0 in | Wt 259.2 lb

## 2015-11-22 DIAGNOSIS — Z79899 Other long term (current) drug therapy: Secondary | ICD-10-CM

## 2015-11-22 DIAGNOSIS — R8299 Other abnormal findings in urine: Secondary | ICD-10-CM | POA: Diagnosis not present

## 2015-11-22 DIAGNOSIS — I10 Essential (primary) hypertension: Secondary | ICD-10-CM

## 2015-11-22 DIAGNOSIS — M5412 Radiculopathy, cervical region: Secondary | ICD-10-CM

## 2015-11-22 DIAGNOSIS — N390 Urinary tract infection, site not specified: Secondary | ICD-10-CM | POA: Diagnosis not present

## 2015-11-22 DIAGNOSIS — R0981 Nasal congestion: Secondary | ICD-10-CM

## 2015-11-22 DIAGNOSIS — J3089 Other allergic rhinitis: Secondary | ICD-10-CM

## 2015-11-22 DIAGNOSIS — R05 Cough: Secondary | ICD-10-CM | POA: Diagnosis not present

## 2015-11-22 DIAGNOSIS — M25511 Pain in right shoulder: Secondary | ICD-10-CM | POA: Diagnosis not present

## 2015-11-22 LAB — POCT URINALYSIS DIPSTICK
Bilirubin, UA: NEGATIVE
Blood, UA: NEGATIVE
Glucose, UA: NEGATIVE
Ketones, UA: NEGATIVE
Nitrite, UA: NEGATIVE
Protein, UA: NEGATIVE
Spec Grav, UA: 1.02
Urobilinogen, UA: 0.2
pH, UA: 6.5

## 2015-11-22 MED ORDER — FLUTICASONE PROPIONATE 50 MCG/ACT NA SUSP: 2.0000 | Freq: Every day | NASAL | 2 refills | Status: DC

## 2015-11-22 MED ORDER — CYCLOBENZAPRINE HCL 5 MG PO TABS: 5.0000 mg | ORAL_TABLET | Freq: Three times a day (TID) | ORAL | Status: DC | PRN

## 2015-11-22 MED ORDER — LORATADINE 10 MG PO TABS: 10.0000 mg | ORAL_TABLET | Freq: Every day | ORAL | 2 refills | Status: DC

## 2015-11-22 MED ORDER — DM-GUAIFENESIN ER 30-600 MG PO TB12: 1.0000 | ORAL_TABLET | Freq: Two times a day (BID) | ORAL | 0 refills | Status: DC | PRN

## 2015-11-22 NOTE — Assessment & Plan Note (Signed)
She also complain of pain in her right shoulder for the last 2 days, she states that she is unable to bring her arm up without pain. She is able to dress  and groom herself. Pain is around her shoulder girdle, she denies any radiation, tingling, numbness or weakness.  On Exam.Restricted range of motion especially above the head due to pain. Mild deep tenderness around shoulder girdle .Strength and sensations intact.Drop arm test negative. It looks like muscular in origin.  She is allergic to most of NSAID. She has a prescription of Tylenol 3 which she can use for this pain too. I gave her a prescription of Flexeril for a few days, that might relax her muscles. We gave her a referral for PT to evaluate and treat her right shoulder pain.

## 2015-11-22 NOTE — Assessment & Plan Note (Signed)
BP Readings from Last 3 Encounters:  11/22/15 (!) 176/75  08/20/15 (!) 157/82  08/08/15 (!) 213/96   Her blood pressure was high today, she states that she never her medicine this morning. She also told me that at home her blood pressure mostly remains within normal limits. She is on lisinopril 40 mg and Lopressor 50 mg twice a day for her blood pressure management.  I told her to take her morning medicines before he comes for her follow-up visit, and bring her blood pressure log next time. Continue with the current management, and reassess during her next follow-up visit, she might need some adjustment in her BP medicine.

## 2015-11-22 NOTE — Assessment & Plan Note (Signed)
She came with a complaint of increased urinary frequency, urgency and nocturia. She denies any lower abdominal pain, dysuria, hematuria or fever.  Her urine dipstick was positive with few leukocytes. We scented her urine for urine analysis complete.  I advised her to drink plenty of fluids, and we will call her if we will find any infection in her urine.

## 2015-11-22 NOTE — Patient Instructions (Addendum)
It was pleasure taking care of you today. I am giving a prescription for a nasal spray,mucinex Dm for cough,and Claritin. Please take them as advised. We are sending you for PT due to shoulder pain. We will check your urine today. Please take your BP medicine each morning even if you are coming to see us that day. Bring your BP log with you during each visit. F/U with us if your symptoms get worse.

## 2015-11-22 NOTE — Assessment & Plan Note (Signed)
Her cough and nasal congestion might be due to some allergic rhinitis or there is some mild viral URI. She has known generalized body ache, headache or fever. No history of recent sick contact.  I gave her a prescription for Claritin, Flonase and Mucinex DM. Follow-up with us if the symptoms worsen.

## 2015-11-22 NOTE — Progress Notes (Signed)
   CC: Increased frequency and urgency of urine. Cough with clear mucus for about 1 week Right shoulder pain for 2 days.  HPI:  Nancy [name] is a 60 y.o.with PMHx as listed below, came to the clinic today with complaint of increased in urinary frequency and urgency for about a week, she has to go to use the bathroom multiple times during night. She denies any associated lower abdominal pain, dysuria, hematuria or fever. She also denies any diarrhea or constipation.  She also complain of increase in her cough for about 1 week, productive of clear sputum, also complains of mild nasal congestion and postnasal drip. She denies any generalized body aches, fever or sick contact.  She also complain of pain in her right shoulder for the last 2 days, she states that she is unable to bring her arm up without pain. She is able to dress  and groom herself. Pain is around her shoulder girdle, she denies any radiation, tingling, numbness or weakness.    Past Medical History:  Diagnosis Date  . Back pain    chronic low back pain L4-5 discectomy:11/99. degenerative thoracic spondylotic changes 4/07  . Chest pain    neg adenosine myoview.  . Hypertension    no LVH EKG-7/05, nl M/C ratio 4/07  . Hypothyroidism    09/1998  . Lower extremity edema    echo EF 55-65% w/o evidence of Dias dysfx.,   . Menorrhagia   . Microcytic anemia    09/1998. Hb-10.5/MCV 76. needs ferritin to determine ACD vs IDA  . Morbid obesity (HCC)   . Obstructive sleep apnea    severe 7/05 RD 161 per hr. /CPAP 18cwp  . Polyarthralgia    (knee/back/ankle) w/dx of fibromyalgia? [facility], knee pain chronic 2/2 obesity, fibromyalgia and Sjogren's.  . Primary Sjogren's syndrome (HCC)    anti Ro+;ANA>1:1280 in homogen pattern, negative ds DNA/RF/anti Smith/RNP/C3-4 comp/la/jo1/Scleroderma/centromere, neg HIV/ACE/Hep B/C, nl CXR 2/05, schirmer salivary glandtest not done, symptom rx:eye drops/prednisone/plaquenil  [facility] rhe[facility], Dr. Rushie NyhanBourne/o'Rourke 3/07. saw [name] but stopped 2/2 cost.  . Uterine bleeding     Review of Systems:  As per HPI  Physical Exam:  Vitals:   11/22/15 0959  BP: (!) 176/75  Pulse: 75  Temp: 98.3 F (36.8 C)  TempSrc: Oral  SpO2: 100%  Weight: 259 lb 3.2 oz (117.6 kg)  Height: 5\' 3"  (1.6 m)   Gen. Well-built, well-nourished, obese lady, not in acute distress. HEENT. Normocephalic, atraumatic. No sinus tenderness and nares are clear. There is no pharyngeal erythema, mild clear exudate at the back of her throat. Neck supple, no lymphadenopathy. Right shoulder. Restricted range of motion especially above the head due to pain. Mild deep tenderness around shoulder girdle .Strength and sensations intact. Drop arm test negative. Lungs. Clear bilaterally, no added sounds. CV. RRR, No R/G/m. Abdomen. Soft, nontender, bowel sounds positive. Extremities. No edema, no cyanosis.  Assessment & Plan:   See Encounters Tab for problem based charting.  Patient seen with Dr. Heide SparkNarendra

## 2015-11-22 NOTE — Progress Notes (Signed)
Internal Medicine Clinic Attending  I saw and evaluated the patient.  I personally confirmed the key portions of the history and exam documented by Dr. Amin and I reviewed pertinent patient test results.  The assessment, diagnosis, and plan were formulated together and I agree with the documentation in the resident's note. 

## 2015-11-23 LAB — URINALYSIS, COMPLETE
Bilirubin, UA: NEGATIVE
Glucose, UA: NEGATIVE
Ketones, UA: NEGATIVE
Nitrite, UA: NEGATIVE
Protein, UA: NEGATIVE
RBC, UA: NEGATIVE
Specific Gravity, UA: 1.019 (ref 1.005–1.030)
Urobilinogen, Ur: 0.2 mg/dL (ref 0.2–1.0)
pH, UA: 6.5 (ref 5.0–7.5)

## 2015-11-23 LAB — MICROSCOPIC EXAMINATION: Casts: NONE SEEN /LPF

## 2015-12-03 ENCOUNTER — Encounter: Payer: Self-pay | Admitting: *Deleted

## 2015-12-11 ENCOUNTER — Ambulatory Visit: Payer: Medicare HMO | Attending: Internal Medicine | Admitting: Physical Therapy

## 2015-12-11 DIAGNOSIS — M6281 Muscle weakness (generalized): Secondary | ICD-10-CM | POA: Diagnosis present

## 2015-12-11 DIAGNOSIS — M25511 Pain in right shoulder: Secondary | ICD-10-CM | POA: Diagnosis present

## 2015-12-11 DIAGNOSIS — M25611 Stiffness of right shoulder, not elsewhere classified: Secondary | ICD-10-CM | POA: Diagnosis present

## 2015-12-11 NOTE — Patient Instructions (Signed)
   Apply a compressive ACE bandage. Rest and elevate the affected painful area.  Apply cold compresses intermittently as needed.  As pain recedes, begin normal activities slowly as tolerated.    The patient is advised to apply ice or cold packs intermittently as needed to relieve pain.  Posture Tips DO: - stand tall and erect - keep chin tucked in - keep head and shoulders in alignment - check posture regularly in mirror or large window - pull head back against headrest in car seat;  Change your position often.  Sit with lumbar support. DON'T: - slouch or slump while watching TV or reading - sit, stand or lie in one position  for too long;  Sitting is especially hard on the spine so if you sit at a desk/use the computer, then stand up often!   Copyright  VHI. All rights reserved.  Posture - Standing   Good posture is important. Avoid slouching and forward head thrust. Maintain curve in low back and align ears over shoul- ders, hips over ankles.  Pull your belly button in toward your back bone.   Copyright  VHI. All rights reserved.  Posture - Sitting   Sit upright, head facing forward. Try using a roll to support lower back. Keep shoulders relaxed, and avoid rounded back. Keep hips level with knees. Avoid crossing legs for long periods.   Copyright  VHI. All rights reserved.

## 2015-12-11 NOTE — Therapy (Addendum)
Functional Assessment Tool Used clinical judgement    Functional Limitation Carrying, moving and handling objects   Carrying, Moving and Handling Objects Current Status [phone]) At least 60 percent but less than 80 percent impaired, limited or restricted   Carrying, Moving and Handling Objects Goal Status (E7076) At least 40 percent but less than 60 percent impaired, limited or restricted       Problem List Patient Active Problem List   Diagnosis Date Noted  . UTI (urinary tract infection) 11/22/2015  . Right shoulder pain 11/22/2015  .  Healthcare maintenance 12/18/2014  . GERD (gastroesophageal reflux disease) 04/13/2014  . Allergic rhinitis 01/24/2014  . Severe obesity (BMI >= 40) ([place]) 09/14/2013  . Postmenopausal bleeding 11/23/2011  . Abnormal CT of brain 11/19/2011  . MACULAR DEGENERATION, BILATERAL 05/22/2008  . Essential hypertension 03/17/2006  . SICCA SYNDROME 03/17/2006  . KNEE PAIN, CHRONIC 03/17/2006  . Acute exacerbation of chronic low back pain 03/17/2006  . Hypothyroidism 09/07/1998  . ANEMIA, IRON DEFICIENCY NOS 09/07/1998    Jory Welke 12/11/2015, 1:35 PM  [facility] [address] [place], [place], [address] Phone: [phone]   Fax:  [phone]  Name: Nancy [name] MRN: [id number] Date of Birth: 1955/04/06   Raeford Razor, PT 12/11/15 1:36 PM Phone: [phone] Fax: [phone]  PHYSICAL THERAPY DISCHARGE SUMMARY  Visits from Start of Care: 1, eval only   Current functional level related to goals / functional outcomes: See above, did not return    Remaining deficits: See above   Education / Equipment: See above  Plan: Patient agrees to discharge.  Patient goals were not met. Patient is being discharged due to not returning since the last visit.  ?????    Raeford Razor, PT 01/08/16 1:32 PM Phone: [phone] Fax: [phone]  Functional Assessment Tool Used clinical judgement    Functional Limitation Carrying, moving and handling objects   Carrying, Moving and Handling Objects Current Status [phone]) At least 60 percent but less than 80 percent impaired, limited or restricted   Carrying, Moving and Handling Objects Goal Status (E7076) At least 40 percent but less than 60 percent impaired, limited or restricted       Problem List Patient Active Problem List   Diagnosis Date Noted  . UTI (urinary tract infection) 11/22/2015  . Right shoulder pain 11/22/2015  .  Healthcare maintenance 12/18/2014  . GERD (gastroesophageal reflux disease) 04/13/2014  . Allergic rhinitis 01/24/2014  . Severe obesity (BMI >= 40) ([place]) 09/14/2013  . Postmenopausal bleeding 11/23/2011  . Abnormal CT of brain 11/19/2011  . MACULAR DEGENERATION, BILATERAL 05/22/2008  . Essential hypertension 03/17/2006  . SICCA SYNDROME 03/17/2006  . KNEE PAIN, CHRONIC 03/17/2006  . Acute exacerbation of chronic low back pain 03/17/2006  . Hypothyroidism 09/07/1998  . ANEMIA, IRON DEFICIENCY NOS 09/07/1998    Jory Welke 12/11/2015, 1:35 PM  [facility] [address] [place], [place], [address] Phone: [phone]   Fax:  [phone]  Name: Nancy [name] MRN: [id number] Date of Birth: 1955/04/06   Raeford Razor, PT 12/11/15 1:36 PM Phone: [phone] Fax: [phone]  PHYSICAL THERAPY DISCHARGE SUMMARY  Visits from Start of Care: 1, eval only   Current functional level related to goals / functional outcomes: See above, did not return    Remaining deficits: See above   Education / Equipment: See above  Plan: Patient agrees to discharge.  Patient goals were not met. Patient is being discharged due to not returning since the last visit.  ?????    Raeford Razor, PT 01/08/16 1:32 PM Phone: [phone] Fax: [phone]  [place], [place], [address] Phone: [phone]   Fax:  [phone]  Physical Therapy Evaluation/DIscharge  Patient Details  Name: Nancy [name] MRN: [id number] Date of Birth: [birth year] Referring Provider: Dr. Lorella Nimrod  Encounter Date: 12/11/2015      PT End of Session - 12/11/15 1306    Visit Number 1   Number of Visits 16   Date for PT Re-Evaluation 02/05/16   PT Start Time 0800   PT Stop Time 0846   PT Time Calculation (min) 46 min   Activity Tolerance Patient limited by pain   Behavior During Therapy [facility] for tasks assessed/performed      Past Medical History:  Diagnosis Date  . Back pain    chronic low back pain L4-5 discectomy:11/99. degenerative thoracic spondylotic changes 4/07  . Chest pain    neg adenosine myoview.  . Hypertension    no LVH EKG-7/05, nl M/C ratio 4/07  . Hypothyroidism    09/1998  . Lower extremity edema    echo EF 55-65% w/o evidence of Dias dysfx.,   . Menorrhagia   . Microcytic anemia    09/1998. Hb-10.5/MCV 76. needs ferritin to determine ACD vs IDA  . Morbid obesity ([place])   . Obstructive sleep apnea    severe 7/05 RD 161 per hr. /CPAP 18cwp  . Polyarthralgia    (knee/back/ankle) w/dx of fibromyalgia? given by [facility] rheum, knee pain chronic 2/2 obesity, fibromyalgia and Sjogren's.  . Primary Sjogren's syndrome ([place])    anti Ro+;ANA>1:1280 in homogen pattern, negative ds DNA/RF/anti Smith/RNP/C3-4 comp/la/jo1/Scleroderma/centromere, neg HIV/ACE/Hep B/C, nl CXR 2/05, schirmer salivary glandtest not done, symptom rx:eye drops/prednisone/plaquenil referral to [facility] rheum, Dr. Wynelle Cleveland 3/07. saw [name] but stopped 2/2 cost.  . Uterine bleeding     Past Surgical History:  Procedure Laterality Date  . EXAMINATION UNDER ANESTHESIA  11/23/2011   Procedure: EXAM UNDER ANESTHESIA;  Surgeon: Osborne Oman, MD;  Location: [facility];  Service: Gynecology;   Laterality: N/A;  . HYSTEROSCOPY W/D&C  11/23/2011   Procedure: DILATATION AND CURETTAGE /HYSTEROSCOPY;  Surgeon: Osborne Oman, MD;  Location: [facility];  Service: Gynecology;  Laterality: N/A;  . LUMBAR FUSION  1999  . SHOULDER SURGERY  unk  . TONSILLECTOMY    . TUBAL LIGATION  1977  . WISDOM TOOTH EXTRACTION      There were no vitals filed for this visit.       Subjective Assessment - 12/11/15 0806    Subjective Rt. shoulder pain for 3 weeks.  She initially thought she slept wrong on it.  She has difficulty lifting her arm up, using her arm for ADLs, grooming.  She describes pain, throbbing, referral of pain into middle of arm.  Has cramps in her hand.     Patient is accompained by: Family member   Pertinent History Back surgery, HTN, had HHPT for L knee (osteoarthritis?)    Limitations House hold activities;Lifting;Other (comment)  sleeping is affected    Diagnostic tests none    Patient Stated Goals to get back to where I was, lift arm up.     Currently in Pain? Yes   Pain Score 6    Pain Location Shoulder   Pain Orientation Right   Pain Descriptors / Indicators Aching;Throbbing;Sharp   Pain Type Acute pain   Pain Onset 1 to 4 weeks ago   Pain Frequency Constant   Aggravating Factors  using her Rt. Arm    Pain  Functional Assessment Tool Used clinical judgement    Functional Limitation Carrying, moving and handling objects   Carrying, Moving and Handling Objects Current Status [phone]) At least 60 percent but less than 80 percent impaired, limited or restricted   Carrying, Moving and Handling Objects Goal Status (E7076) At least 40 percent but less than 60 percent impaired, limited or restricted       Problem List Patient Active Problem List   Diagnosis Date Noted  . UTI (urinary tract infection) 11/22/2015  . Right shoulder pain 11/22/2015  .  Healthcare maintenance 12/18/2014  . GERD (gastroesophageal reflux disease) 04/13/2014  . Allergic rhinitis 01/24/2014  . Severe obesity (BMI >= 40) ([place]) 09/14/2013  . Postmenopausal bleeding 11/23/2011  . Abnormal CT of brain 11/19/2011  . MACULAR DEGENERATION, BILATERAL 05/22/2008  . Essential hypertension 03/17/2006  . SICCA SYNDROME 03/17/2006  . KNEE PAIN, CHRONIC 03/17/2006  . Acute exacerbation of chronic low back pain 03/17/2006  . Hypothyroidism 09/07/1998  . ANEMIA, IRON DEFICIENCY NOS 09/07/1998    Jory Welke 12/11/2015, 1:35 PM  [facility] [address] [place], [place], [address] Phone: [phone]   Fax:  [phone]  Name: Nancy [name] MRN: [id number] Date of Birth: 1955/04/06   Raeford Razor, PT 12/11/15 1:36 PM Phone: [phone] Fax: [phone]  PHYSICAL THERAPY DISCHARGE SUMMARY  Visits from Start of Care: 1, eval only   Current functional level related to goals / functional outcomes: See above, did not return    Remaining deficits: See above   Education / Equipment: See above  Plan: Patient agrees to discharge.  Patient goals were not met. Patient is being discharged due to not returning since the last visit.  ?????    Raeford Razor, PT 01/08/16 1:32 PM Phone: [phone] Fax: [phone]

## 2015-12-17 ENCOUNTER — Ambulatory Visit: Payer: Medicare HMO | Admitting: Physical Therapy

## 2015-12-23 ENCOUNTER — Ambulatory Visit: Payer: Medicare HMO | Admitting: Physical Therapy

## 2015-12-25 ENCOUNTER — Ambulatory Visit: Payer: Medicare HMO | Admitting: Physical Therapy

## 2015-12-30 ENCOUNTER — Ambulatory Visit: Payer: Medicare HMO | Admitting: Physical Therapy

## 2016-01-02 ENCOUNTER — Ambulatory Visit: Payer: Medicare HMO | Admitting: Physical Therapy

## 2016-01-06 ENCOUNTER — Ambulatory Visit: Payer: Medicare HMO | Admitting: Physical Therapy

## 2016-01-08 ENCOUNTER — Ambulatory Visit: Payer: Medicare HMO | Attending: Internal Medicine | Admitting: Physical Therapy

## 2016-04-10 ENCOUNTER — Other Ambulatory Visit: Payer: Self-pay

## 2016-04-10 DIAGNOSIS — M25561 Pain in right knee: Secondary | ICD-10-CM

## 2016-04-10 DIAGNOSIS — M25562 Pain in left knee: Principal | ICD-10-CM

## 2016-04-10 MED ORDER — ACETAMINOPHEN-CODEINE #3 300-30 MG PO TABS: 1.0000 | ORAL_TABLET | ORAL | 0 refills | Status: DC | PRN

## 2016-04-10 NOTE — Assessment & Plan Note (Signed)
-   Patient will need a pain contract for tylenol 3 on her next visit

## 2016-04-10 NOTE — Telephone Encounter (Signed)
Last written 08/20/15. Call pt - no answer; mailbox not set up, unable to leave message.

## 2016-04-10 NOTE — Telephone Encounter (Signed)
Tylenol #3 rx called to [facility]. 

## 2016-04-10 NOTE — Telephone Encounter (Signed)
acetaminophen-codeine (TYLENOL #3) 300-30 MG tablet, Refill request @ [facility].

## 2016-04-20 ENCOUNTER — Telehealth: Payer: Self-pay | Admitting: Internal Medicine

## 2016-04-20 DIAGNOSIS — G4733 Obstructive sleep apnea (adult) (pediatric): Secondary | ICD-10-CM

## 2016-04-20 NOTE — Telephone Encounter (Signed)
Pt's daughter stopped by the office this morning requesting a New CPAP Machine.  [facility] and [facility] are requesting a new order be placed and faxed.  Please advise.

## 2016-04-22 ENCOUNTER — Other Ambulatory Visit: Payer: Self-pay | Admitting: Internal Medicine

## 2016-04-22 DIAGNOSIS — G4733 Obstructive sleep apnea (adult) (pediatric): Secondary | ICD-10-CM

## 2016-04-22 NOTE — Telephone Encounter (Signed)
Put in a new order.   Thanks!

## 2016-04-28 ENCOUNTER — Encounter: Payer: Self-pay | Admitting: Internal Medicine

## 2016-04-28 ENCOUNTER — Ambulatory Visit (INDEPENDENT_AMBULATORY_CARE_PROVIDER_SITE_OTHER): Payer: Medicare HMO | Admitting: Internal Medicine

## 2016-04-28 ENCOUNTER — Encounter (INDEPENDENT_AMBULATORY_CARE_PROVIDER_SITE_OTHER): Payer: Self-pay

## 2016-04-28 DIAGNOSIS — Z760 Encounter for issue of repeat prescription: Secondary | ICD-10-CM

## 2016-04-28 DIAGNOSIS — R5383 Other fatigue: Secondary | ICD-10-CM | POA: Diagnosis not present

## 2016-04-28 DIAGNOSIS — Z79899 Other long term (current) drug therapy: Secondary | ICD-10-CM

## 2016-04-28 DIAGNOSIS — G4733 Obstructive sleep apnea (adult) (pediatric): Secondary | ICD-10-CM

## 2016-04-28 DIAGNOSIS — Z6841 Body Mass Index (BMI) 40.0 and over, adult: Secondary | ICD-10-CM | POA: Diagnosis not present

## 2016-04-28 DIAGNOSIS — Z9989 Dependence on other enabling machines and devices: Secondary | ICD-10-CM

## 2016-04-28 DIAGNOSIS — I10 Essential (primary) hypertension: Secondary | ICD-10-CM | POA: Diagnosis not present

## 2016-04-28 MED ORDER — CHLORTHALIDONE 50 MG PO TABS: 50.0000 mg | ORAL_TABLET | Freq: Every day | ORAL | 11 refills | Status: DC

## 2016-04-28 NOTE — Assessment & Plan Note (Signed)
Patient presents today needing a follow-up visit to discuss her obstructive sleep apnea in order to receive a new CPAP machine. Sleep study in 2012 showed severe awakenings 161 per hour. She reports compliance with her CPAP, wearing it nightly, normally for 8-10 hours at night. She has not worn her CPAP in 1 month as it has broken. She states that without her CPAP she has trouble sleeping, she wakes up frequently, she wakes up with morning headaches, she will stop breathing in the middle of the night with a choking sensation, and she feels tired and fatigued throughout the day.  Assessment: OSA with need for CPAP  Plan: -Reorder CPAP machine -Weight loss

## 2016-04-28 NOTE — Patient Instructions (Signed)
Increase your chlorthalidone to 50 mg once a day. He can take 2 of your 25 mg tablets until you run out. Once he ran out, the new dose of 50 mg tablets will be available at your pharmacy.  I will send in my note for the renewal of your CPAP machine.  Please follow up in 4 weeks for blood pressure recheck.

## 2016-04-28 NOTE — Progress Notes (Signed)
    CC: Follow-up for obstructive sleep apnea  HPI: [name] is a 61 y.o. female with PMHx of obstructive sleep apnea, hypertension who presents to the clinic for follow-up for obstructive sleep apnea.   Patient presents today needing a follow-up visit to discuss her obstructive sleep apnea in order to receive a new CPAP machine. Sleep study in 2012 showed severe awakenings 161 per hour. She reports compliance with her CPAP, wearing it nightly, normally for 8-10 hours at night. She has not worn her CPAP in 1 month as it has broken. She states that without her CPAP she has trouble sleeping, she wakes up frequently, she wakes up with morning headaches, she will stop breathing in the middle of the night with a choking sensation, and she feels tired and fatigued throughout the day.  Patient reports compliance with her anti-hypertensive medications. However, her blood pressure at home has been running in the 150s to 160s. She reports compliance with lisinopril 40 mg once a day, Lopressor 50 mg twice a day, and chlorthalidone 25 mg once a day.  Past Medical History:  Diagnosis Date  . Back pain    chronic low back pain L4-5 discectomy:11/99. degenerative thoracic spondylotic changes 4/07  . Chest pain    neg adenosine myoview.  . Hypertension    no LVH EKG-7/05, nl M/C ratio 4/07  . Hypothyroidism    09/1998  . Lower extremity edema    echo EF 55-65% w/o evidence of Dias dysfx.,   . Menorrhagia   . Microcytic anemia    09/1998. Hb-10.5/MCV 76. needs ferritin to determine ACD vs IDA  . Morbid obesity ([facility])   . Obstructive sleep apnea    severe 7/05 RD 161 per hr. /CPAP 18cwp  . Polyarthralgia    (knee/back/ankle) w/dx of fibromyalgia? [facility], knee pain chronic 2/2 obesity, fibromyalgia and Sjogren's.  . Primary Sjogren's syndrome ([facility])    anti Ro+;ANA>1:1280 in homogen pattern, negative ds DNA/RF/anti Smith/RNP/C3-4 comp/la/jo1/Scleroderma/centromere, neg HIV/ACE/Hep  B/C, nl CXR 2/05, schirmer salivary glandtest not done, symptom rx:eye drops/prednisone/plaquenil [facility], Dr. Rushie NyhanBourne/o'Rourke 3/07. saw [name] but stopped 2/2 cost.  . Uterine bleeding     Review of Systems: Please see pertinent ROS reviewed in HPI and problem based charting.   Physical Exam: Vitals:   04/28/16 0904  BP: (!) 181/92  Pulse: 66  Temp: 98.3 F (36.8 C)  TempSrc: Oral  SpO2: 99%  Weight: 266 lb 9.6 oz (120.9 kg)   General: Vital signs reviewed.  Patient is Obese, fatigued, in no acute distress and cooperative with exam.  Neck: Large neck circumference Cardiovascular: RRR, S1 normal, S2 normal, no murmurs, gallops, or rubs. Pulmonary/Chest: Clear to auscultation bilaterally, no wheezes, rales, or rhonchi. Abdominal: Soft, non-tender, non-distended, BS +, obese Extremities: Trace lower extremity edema bilaterally Skin: Warm, dry and intact. No rashes or erythema.  Assessment & Plan:  See encounters tab for problem based medical decision making. Patient discussed with Dr. Oswaldo DoneVincent

## 2016-04-28 NOTE — Assessment & Plan Note (Signed)
Patient reports compliance with her anti-hypertensive medications. However, her blood pressure at home has been running in the 150s to 160s. She reports compliance with lisinopril 40 mg once a day, Lopressor 50 mg twice a day, and chlorthalidone 25 mg once a day. Given chronically uncontrolled blood pressure, we'll increase chlorthalidone to 50 mg once a day and continue other medications as prescribed.  Plan: -Increase chlorthalidone to 50 mg once a day -Continue lisinopril 40 mg once a day -Lopressor 50 mg twice a day -Follow-up in 4 weeks

## 2016-04-28 NOTE — Progress Notes (Signed)
Internal Medicine Clinic Attending  Case discussed with Dr. Burns soon after the resident saw the patient.  We reviewed the resident's history and exam and pertinent patient test results.  I agree with the assessment, diagnosis, and plan of care documented in the resident's note. 

## 2016-05-13 DIAGNOSIS — G4733 Obstructive sleep apnea (adult) (pediatric): Secondary | ICD-10-CM | POA: Diagnosis not present

## 2016-05-13 DIAGNOSIS — M2550 Pain in unspecified joint: Secondary | ICD-10-CM | POA: Diagnosis not present

## 2016-05-22 DIAGNOSIS — H2513 Age-related nuclear cataract, bilateral: Secondary | ICD-10-CM | POA: Diagnosis not present

## 2016-05-22 DIAGNOSIS — H04123 Dry eye syndrome of bilateral lacrimal glands: Secondary | ICD-10-CM | POA: Diagnosis not present

## 2016-05-22 DIAGNOSIS — H353131 Nonexudative age-related macular degeneration, bilateral, early dry stage: Secondary | ICD-10-CM | POA: Diagnosis not present

## 2016-05-26 ENCOUNTER — Ambulatory Visit (INDEPENDENT_AMBULATORY_CARE_PROVIDER_SITE_OTHER): Payer: Medicare HMO | Admitting: Internal Medicine

## 2016-05-26 VITALS — BP 140/57 | HR 58 | Temp 97.8°F | Ht 63.0 in | Wt 269.6 lb

## 2016-05-26 DIAGNOSIS — I10 Essential (primary) hypertension: Secondary | ICD-10-CM | POA: Diagnosis not present

## 2016-05-26 DIAGNOSIS — M545 Low back pain: Secondary | ICD-10-CM

## 2016-05-26 DIAGNOSIS — G8929 Other chronic pain: Secondary | ICD-10-CM | POA: Diagnosis not present

## 2016-05-26 DIAGNOSIS — M25562 Pain in left knee: Secondary | ICD-10-CM | POA: Diagnosis not present

## 2016-05-26 DIAGNOSIS — Z79899 Other long term (current) drug therapy: Secondary | ICD-10-CM

## 2016-05-26 DIAGNOSIS — M25561 Pain in right knee: Secondary | ICD-10-CM

## 2016-05-26 DIAGNOSIS — E669 Obesity, unspecified: Secondary | ICD-10-CM

## 2016-05-26 MED ORDER — ACETAMINOPHEN-CODEINE #3 300-30 MG PO TABS: 1.0000 | ORAL_TABLET | ORAL | 0 refills | Status: DC | PRN

## 2016-05-26 NOTE — Assessment & Plan Note (Signed)
Vitals:   05/26/16 1038  BP: (!) 140/57  Pulse: (!) 58  Temp: 97.8 F (36.6 C)   BP is under goal today on chlorthalidone 50mg  + lisinopril 40mg  daily + lopressor 50mg  bid. Compliant with meds, no side effects.   Continue current regimen. Check BMET today as no recent BMET.

## 2016-05-26 NOTE — Progress Notes (Signed)
Internal Medicine Clinic Attending  Case discussed with Dr. Ahmed at the time of the visit.  We reviewed the resident's history and exam and pertinent patient test results.  I agree with the assessment, diagnosis, and plan of care documented in the resident's note. 

## 2016-05-26 NOTE — Progress Notes (Signed)
   CC: BP follow up  HPI:  Nancy [name] is a 61 y.o. with PMh as listed below is here for HTN f/up   Last visit her BP was elevated and chlorthalidone was increased 25mg  to 50mg . On lisinopril 40mg  daily and lopressor 50mg  bid. Compliant with meds, no side effects.   Also asking about pain medication refill. She is on tylenol #3, last filled 04/11/16 #20 tablets. She wants to increase her pain regimen as it is not helping much, al though it does help somewhat. Pain is worst when it's raining or cold outside, has pain on both knees and her back. Not interested in knee injection as she did not have good experience with back injection, al though I explained it's not the same procedure.      Past Medical History:  Diagnosis Date  . Back pain    chronic low back pain L4-5 discectomy:11/99. degenerative thoracic spondylotic changes 4/07  . Chest pain    neg adenosine myoview.  . Hypertension    no LVH EKG-7/05, nl M/C ratio 4/07  . Hypothyroidism    09/1998  . Lower extremity edema    echo EF 55-65% w/o evidence of Dias dysfx.,   . Menorrhagia   . Microcytic anemia    09/1998. Hb-10.5/MCV 76. needs ferritin to determine ACD vs IDA  . Morbid obesity ([facility])   . Obstructive sleep apnea    severe 7/05 RD 161 per hr. /CPAP 18cwp  . Polyarthralgia    (knee/back/ankle) w/dx of fibromyalgia? [facility], knee pain chronic 2/2 obesity, fibromyalgia and Sjogren's.  . Primary Sjogren's syndrome ([facility])    anti Ro+;ANA>1:1280 in homogen pattern, negative ds DNA/RF/anti Smith/RNP/C3-4 comp/la/jo1/Scleroderma/centromere, neg HIV/ACE/Hep B/C, nl CXR 2/05, schirmer salivary glandtest not done, symptom rx:eye drops/prednisone/plaquenil [facility], Dr. Rushie NyhanBourne/o'Rourke 3/07. saw [name] but stopped 2/2 cost.  . Uterine bleeding     Review of Systems:   Review of Systems  Constitutional: Negative for chills and fever.  Respiratory: Negative for shortness of breath.     Cardiovascular: Negative for chest pain and palpitations.  Gastrointestinal: Negative for heartburn, nausea and vomiting.  Musculoskeletal: Positive for back pain and joint pain.  Neurological: Negative for dizziness and headaches.     Physical Exam:  Vitals:   05/26/16 1038  BP: (!) 140/57  Pulse: (!) 58  Temp: 97.8 F (36.6 C)  TempSrc: Oral  SpO2: 100%  Weight: 269 lb 9.6 oz (122.3 kg)  Height: 5\' 3"  (1.6 m)  Physical Exam  Constitutional: She is oriented to person, place, and time. She appears well-developed and well-nourished. No distress.  Obese female, sitting in a wheel chair.   HENT:  Head: Normocephalic and atraumatic.  Eyes: Conjunctivae are normal.  Cardiovascular: Normal rate and regular rhythm.  Exam reveals no gallop and no friction rub.   No murmur heard. Respiratory: Effort normal and breath sounds normal. No respiratory distress. She has no wheezes.  GI: Soft. Bowel sounds are normal.  Musculoskeletal:  Limited ROM on both knees.  Neurological: She is alert and oriented to person, place, and time.  Skin: She is not diaphoretic.  Psychiatric: She has a normal mood and affect.    Assessment & Plan:   See Encounters Tab for problem based charting.  Patient discussed with Dr. Cleda DaubE. Hoffman

## 2016-05-26 NOTE — Assessment & Plan Note (Signed)
Continues to have b/l knee pain and back pain. On tylenol #3 (20 tabs monthly by Dr. Heide SparkNarendra). Reviewed database, looks appropriate. Asking for refill as she ran out. Offered her knee injection, she declined.  - will refer one time #20 tablet. She needs to see Dr. Heide SparkNarendra to discuss further refills.

## 2016-05-26 NOTE — Patient Instructions (Signed)
Gave you refill on tylenol #3.  You need to see Dr. Heide SparkNarendra for further refill.  BP looks good today, continue your current medications.   We will check your labs today.

## 2016-05-27 LAB — BASIC METABOLIC PANEL WITH GFR
BUN/Creatinine Ratio: 10 — ABNORMAL LOW (ref 12–28)
BUN: 9 mg/dL (ref 8–27)
CO2: 29 mmol/L (ref 18–29)
Calcium: 9.1 mg/dL (ref 8.7–10.3)
Chloride: 96 mmol/L (ref 96–106)
Creatinine, Ser: 0.89 mg/dL (ref 0.57–1.00)
GFR calc Af Amer: 80 mL/min/{1.73_m2}
GFR calc non Af Amer: 69 mL/min/{1.73_m2}
Glucose: 107 mg/dL — ABNORMAL HIGH (ref 65–99)
Potassium: 4 mmol/L (ref 3.5–5.2)
Sodium: 140 mmol/L (ref 134–144)

## 2016-06-13 DIAGNOSIS — G4733 Obstructive sleep apnea (adult) (pediatric): Secondary | ICD-10-CM | POA: Diagnosis not present

## 2016-06-13 DIAGNOSIS — M2550 Pain in unspecified joint: Secondary | ICD-10-CM | POA: Diagnosis not present

## 2016-06-18 NOTE — Telephone Encounter (Signed)
Helen can you please close this enounter °

## 2016-06-30 ENCOUNTER — Other Ambulatory Visit: Payer: Self-pay | Admitting: Internal Medicine

## 2016-06-30 DIAGNOSIS — M25561 Pain in right knee: Secondary | ICD-10-CM

## 2016-06-30 DIAGNOSIS — M25562 Pain in left knee: Principal | ICD-10-CM

## 2016-06-30 DIAGNOSIS — I10 Essential (primary) hypertension: Secondary | ICD-10-CM

## 2016-07-01 MED ORDER — LISINOPRIL 40 MG PO TABS: 40.0000 mg | ORAL_TABLET | Freq: Every day | ORAL | 11 refills | Status: DC

## 2016-07-01 NOTE — Telephone Encounter (Signed)
Tylenol #3 rx called to [facility]. 

## 2016-07-02 ENCOUNTER — Telehealth: Payer: Self-pay

## 2016-07-02 NOTE — Telephone Encounter (Signed)
Tylenol  #3 called  pharmacy

## 2016-07-13 DIAGNOSIS — M2550 Pain in unspecified joint: Secondary | ICD-10-CM | POA: Diagnosis not present

## 2016-07-13 DIAGNOSIS — G4733 Obstructive sleep apnea (adult) (pediatric): Secondary | ICD-10-CM | POA: Diagnosis not present

## 2016-07-29 ENCOUNTER — Other Ambulatory Visit: Payer: Self-pay | Admitting: Internal Medicine

## 2016-07-29 DIAGNOSIS — J309 Allergic rhinitis, unspecified: Secondary | ICD-10-CM

## 2016-08-11 ENCOUNTER — Ambulatory Visit (INDEPENDENT_AMBULATORY_CARE_PROVIDER_SITE_OTHER): Payer: Medicare HMO | Admitting: Internal Medicine

## 2016-08-11 ENCOUNTER — Encounter: Payer: Self-pay | Admitting: Internal Medicine

## 2016-08-11 VITALS — BP 157/73 | HR 80 | Temp 98.3°F | Ht 63.0 in | Wt 268.5 lb

## 2016-08-11 DIAGNOSIS — Z9989 Dependence on other enabling machines and devices: Secondary | ICD-10-CM

## 2016-08-11 DIAGNOSIS — Z79899 Other long term (current) drug therapy: Secondary | ICD-10-CM | POA: Diagnosis not present

## 2016-08-11 DIAGNOSIS — G4733 Obstructive sleep apnea (adult) (pediatric): Secondary | ICD-10-CM

## 2016-08-11 DIAGNOSIS — M25561 Pain in right knee: Secondary | ICD-10-CM | POA: Diagnosis not present

## 2016-08-11 DIAGNOSIS — M25562 Pain in left knee: Secondary | ICD-10-CM

## 2016-08-11 DIAGNOSIS — Z0000 Encounter for general adult medical examination without abnormal findings: Secondary | ICD-10-CM

## 2016-08-11 DIAGNOSIS — E039 Hypothyroidism, unspecified: Secondary | ICD-10-CM

## 2016-08-11 DIAGNOSIS — J309 Allergic rhinitis, unspecified: Secondary | ICD-10-CM | POA: Diagnosis not present

## 2016-08-11 DIAGNOSIS — I10 Essential (primary) hypertension: Secondary | ICD-10-CM

## 2016-08-11 DIAGNOSIS — G8929 Other chronic pain: Secondary | ICD-10-CM

## 2016-08-11 DIAGNOSIS — D509 Iron deficiency anemia, unspecified: Secondary | ICD-10-CM

## 2016-08-11 MED ORDER — DM-GUAIFENESIN ER 30-600 MG PO TB12: 1.0000 | ORAL_TABLET | Freq: Two times a day (BID) | ORAL | 0 refills | Status: DC | PRN

## 2016-08-11 MED ORDER — ACETAMINOPHEN-CODEINE #3 300-30 MG PO TABS: ORAL_TABLET | ORAL | 0 refills | Status: DC

## 2016-08-11 MED ORDER — LORATADINE 10 MG PO TABS: 10.0000 mg | ORAL_TABLET | Freq: Every day | ORAL | 2 refills | Status: DC

## 2016-08-11 NOTE — Assessment & Plan Note (Signed)
-   Patient had a history of iron deficiency anemia but has been off her iron pills over the last 2 years - We'll recheck her CBC and iron studies given her complaint of fatigue - We'll consider restarting her iron pills if she is found to be iron deficient and possibly referring her to GI for further workup

## 2016-08-11 NOTE — Assessment & Plan Note (Signed)
-   TSH was last checked last year and was within normal limits - Patient states she is compliant with her Synthroid - She does complain of worsening fatigue which she attributes to her lack of sleep but she may have a component of hypothyroidism associated with it - We'll recheck TSH today and continue with her current dose of Synthroid

## 2016-08-11 NOTE — Assessment & Plan Note (Addendum)
BP Readings from Last 3 Encounters:  08/11/16 (!) 157/73  05/26/16 (!) 140/57  04/28/16 (!) 181/92    Lab Results  Component Value Date   NA 140 05/26/2016   K 4.0 05/26/2016   CREATININE 0.89 05/26/2016    Assessment: Blood pressure control:  fair Progress toward BP goal:   deteriorated Comments: complaint with chlorthalidone, lisinopril, metoprolol  Plan: Medications:  continue current medications Educational resources provided: brochure (denies need ) Self management tools provided:   Other plans:  Patient complaining of intermittent cramps in her legs which may be secondary to hypokalemia from her chlorthalidone use. Will check BMP today

## 2016-08-11 NOTE — Patient Instructions (Signed)
-   It was a pleasure seeing you today - I have refilled your pain medication - Your chest pain and congestion appear to be secondary to a viral infection or your allergies. Please take the flonase and claritin as well as try mucinex DM for your symptoms - We will check some blood work today - I will refer you for another sleep study where they can re assess your need for a CPAP and help adjust the settings

## 2016-08-11 NOTE — Assessment & Plan Note (Addendum)
-   We'll check HIV and hepatitis C antibody as part of routine health maintenance - Patient will need a Pap smear on her next visit

## 2016-08-11 NOTE — Progress Notes (Signed)
   Subjective:    Patient ID: Nancy ChristiansLinda S [name], female    DOB: 02/11/1956, 61 y.o.   MRN: [id number]  HPI  This patient. Patient is here for routine follow-up of her hypertension and obstructive sleep apnea.  Patient states that since she has restarted her CPAP she has not slept well and feels like the pressure is too high. She also states that since starting her CPAP she has developed nasal congestion and cough productive of whitish phlegm as well as chest pain on taking deep breaths and coughing which is midsternal, nonradiating and reproducible to palpation. Patient also complained of feeling feverish 5 days ago but this has since resolved. Patient also complained of associated fatigue which she attributes to lack of sleep secondary to using her CPAP machine.  Review of Systems  Constitutional: Positive for fatigue and fever. Negative for activity change and appetite change.  HENT: Positive for postnasal drip and rhinorrhea.   Eyes: Negative.   Respiratory: Positive for cough. Negative for shortness of breath and wheezing.   Cardiovascular: Positive for chest pain and leg swelling. Negative for palpitations.  Gastrointestinal: Negative.   Musculoskeletal: Positive for arthralgias.  Skin: Negative.   Neurological: Negative.   Psychiatric/Behavioral: Negative.        Objective:   Physical Exam  Constitutional: She is oriented to person, place, and time. She appears well-developed and well-nourished.  HENT:  Head: Normocephalic and atraumatic.  Mouth/Throat: No oropharyngeal exudate.  Neck: Neck supple.  Cardiovascular: Normal rate, regular rhythm and normal heart sounds.   Pulmonary/Chest: Effort normal and breath sounds normal. No respiratory distress.  Abdominal: Soft. Bowel sounds are normal. She exhibits no distension.  Musculoskeletal: Normal range of motion. She exhibits edema.  Trace b/l ankle swelling  Lymphadenopathy:    She has no cervical adenopathy.  Neurological: She  is alert and oriented to person, place, and time.  Skin: Skin is warm. No rash noted. No erythema.  Psychiatric: Her behavior is normal.          Assessment & Plan:  Please see problem based charting for assessment and plan:

## 2016-08-11 NOTE — Assessment & Plan Note (Addendum)
-   Patient presents with rhinorrhea, nasal congestion, postnasal drip and a cough productive of whitish phlegm - I suspect that some of these symptoms may be secondary to her allergic rhinitis. Patient has not been compliant with Flonase but does take loratadine as needed - I encouraged patient to refill her Flonase as well as take her loratadine daily and see if this will alleviate her symptoms - It is also possible that she has a viral URI contributing to these symptoms and I encouraged conservative management including Mucinex DM and throat lozenges

## 2016-08-11 NOTE — Assessment & Plan Note (Signed)
-   Patient states that she started using her CPAP again in April and since then her cough and nasal congestion have worsened. She also states that she is unable to sleep well at night secondary to her CPAP and feels that the pressure is too high. She also complains of midsternal chest pressure which is worsened by cough and deep inspiration and reproducible to palpation. - I suspect that her chest pressure is secondary to a musculoskeletal etiology secondary to her cough - Given the patient has not had a sleep study in over 5 years I will refer her again for this to help us get a better idea of what pressure on the CPAP she may need. - Would consider holding her CPAP for a week given her symptoms and discomfort while on it.

## 2016-08-11 NOTE — Assessment & Plan Note (Signed)
-   Patient has chronic bilateral knee pain as well as back pain and states that she takes Tylenol 3 for this sparingly - We'll refill her prescription today - We'll check a UDS on her next visit - New pain contract signed today and FYI placed in the chart

## 2016-08-12 ENCOUNTER — Encounter: Payer: Self-pay | Admitting: Internal Medicine

## 2016-08-12 ENCOUNTER — Telehealth: Payer: Self-pay | Admitting: Internal Medicine

## 2016-08-12 LAB — BMP8+ANION GAP
Anion Gap: 13 mmol/L (ref 10.0–18.0)
BUN/Creatinine Ratio: 8 — ABNORMAL LOW (ref 12–28)
BUN: 7 mg/dL — ABNORMAL LOW (ref 8–27)
CO2: 28 mmol/L (ref 18–29)
Calcium: 8.4 mg/dL — ABNORMAL LOW (ref 8.7–10.3)
Chloride: 96 mmol/L (ref 96–106)
Creatinine, Ser: 0.91 mg/dL (ref 0.57–1.00)
GFR calc Af Amer: 78 mL/min/{1.73_m2}
GFR calc non Af Amer: 67 mL/min/{1.73_m2}
Glucose: 96 mg/dL (ref 65–99)
Potassium: 3.9 mmol/L (ref 3.5–5.2)
Sodium: 137 mmol/L (ref 134–144)

## 2016-08-12 LAB — IRON AND TIBC
Iron Saturation: 28 % (ref 15–55)
Iron: 72 ug/dL (ref 27–139)
Total Iron Binding Capacity: 253 ug/dL (ref 250–450)
UIBC: 181 ug/dL (ref 118–369)

## 2016-08-12 LAB — HEPATITIS C ANTIBODY: Hep C Virus Ab: 0.1 {s_co_ratio} (ref 0.0–0.9)

## 2016-08-12 LAB — CBC WITH DIFFERENTIAL/PLATELET
Basophils Absolute: 0 10*3/uL (ref 0.0–0.2)
Basos: 0 %
EOS (ABSOLUTE): 0.1 10*3/uL (ref 0.0–0.4)
Eos: 3 %
Hematocrit: 34.7 % (ref 34.0–46.6)
Hemoglobin: 10.7 g/dL — ABNORMAL LOW (ref 11.1–15.9)
Immature Grans (Abs): 0 10*3/uL (ref 0.0–0.1)
Immature Granulocytes: 0 %
Lymphocytes Absolute: 1.5 10*3/uL (ref 0.7–3.1)
Lymphs: 47 %
MCH: 23.2 pg — ABNORMAL LOW (ref 26.6–33.0)
MCHC: 30.8 g/dL — ABNORMAL LOW (ref 31.5–35.7)
MCV: 75 fL — ABNORMAL LOW (ref 79–97)
Monocytes Absolute: 0.2 10*3/uL (ref 0.1–0.9)
Monocytes: 7 %
Neutrophils Absolute: 1.3 10*3/uL — ABNORMAL LOW (ref 1.4–7.0)
Neutrophils: 43 %
Platelets: 267 10*3/uL (ref 150–379)
RBC: 4.62 x10E6/uL (ref 3.77–5.28)
RDW: 17.2 % — ABNORMAL HIGH (ref 12.3–15.4)
WBC: 3.1 10*3/uL — ABNORMAL LOW (ref 3.4–10.8)

## 2016-08-12 LAB — HIV ANTIBODY (ROUTINE TESTING W REFLEX): HIV Screen 4th Generation wRfx: NONREACTIVE

## 2016-08-12 LAB — TSH: TSH: 3.16 u[IU]/mL (ref 0.450–4.500)

## 2016-08-12 LAB — FERRITIN: Ferritin: 143 ng/mL (ref 15–150)

## 2016-08-12 NOTE — Telephone Encounter (Signed)
I attempted to call patient multiple times to go over the results of her blood work. She was noted to be mildly anemic and leukopenic with normal iron studies and a normal BMP with the exception of borderline hypocalcemia. I will recheck her CBC on her follow up visit but her Hg has been stable over the last year in the 10s. Patient did not pick up the phone and her voicemail was not set up.

## 2016-08-13 DIAGNOSIS — M2550 Pain in unspecified joint: Secondary | ICD-10-CM | POA: Diagnosis not present

## 2016-08-13 DIAGNOSIS — G4733 Obstructive sleep apnea (adult) (pediatric): Secondary | ICD-10-CM | POA: Diagnosis not present

## 2016-09-12 DIAGNOSIS — M2550 Pain in unspecified joint: Secondary | ICD-10-CM | POA: Diagnosis not present

## 2016-09-12 DIAGNOSIS — G4733 Obstructive sleep apnea (adult) (pediatric): Secondary | ICD-10-CM | POA: Diagnosis not present

## 2016-10-13 DIAGNOSIS — G4733 Obstructive sleep apnea (adult) (pediatric): Secondary | ICD-10-CM | POA: Diagnosis not present

## 2016-10-13 DIAGNOSIS — M2550 Pain in unspecified joint: Secondary | ICD-10-CM | POA: Diagnosis not present

## 2016-10-17 ENCOUNTER — Emergency Department (HOSPITAL_COMMUNITY)
Admission: EM | Admit: 2016-10-17 | Discharge: 2016-10-17 | Disposition: A | Discharge status: Home / Self Care | Payer: Medicare HMO | Attending: Emergency Medicine | Admitting: Emergency Medicine

## 2016-10-17 ENCOUNTER — Emergency Department (HOSPITAL_COMMUNITY): Payer: Medicare HMO

## 2016-10-17 ENCOUNTER — Encounter (HOSPITAL_COMMUNITY): Payer: Self-pay | Admitting: Emergency Medicine

## 2016-10-17 DIAGNOSIS — I10 Essential (primary) hypertension: Secondary | ICD-10-CM | POA: Diagnosis not present

## 2016-10-17 DIAGNOSIS — Z041 Encounter for examination and observation following transport accident: Secondary | ICD-10-CM | POA: Diagnosis not present

## 2016-10-17 DIAGNOSIS — E039 Hypothyroidism, unspecified: Secondary | ICD-10-CM | POA: Insufficient documentation

## 2016-10-17 DIAGNOSIS — M545 Low back pain, unspecified: Secondary | ICD-10-CM

## 2016-10-17 DIAGNOSIS — Z79899 Other long term (current) drug therapy: Secondary | ICD-10-CM | POA: Diagnosis not present

## 2016-10-17 DIAGNOSIS — M25561 Pain in right knee: Secondary | ICD-10-CM | POA: Diagnosis present

## 2016-10-17 MED ORDER — METHOCARBAMOL 500 MG PO TABS: 500.0000 mg | ORAL_TABLET | Freq: Two times a day (BID) | ORAL | 0 refills | Status: DC

## 2016-10-17 MED ORDER — METHOCARBAMOL 500 MG PO TABS
500.0000 mg | ORAL_TABLET | Freq: Once | ORAL | Status: AC
  Administered 2016-10-17: 500 mg via ORAL
  Filled 2016-10-17: qty 1
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
## 2016-10-17 NOTE — Discharge Instructions (Signed)
Take the prescribed medication as directed.  Can take this with your regular pain medication. Follow-up with your primary care doctor if any ongoing issues. Return to the ED for new or worsening symptoms.
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
## 2016-10-22 ENCOUNTER — Ambulatory Visit (HOSPITAL_BASED_OUTPATIENT_CLINIC_OR_DEPARTMENT_OTHER): Payer: Medicare HMO | Attending: Internal Medicine | Admitting: Internal Medicine

## 2016-10-22 DIAGNOSIS — G4733 Obstructive sleep apnea (adult) (pediatric): Secondary | ICD-10-CM | POA: Diagnosis present

## 2016-10-25 DIAGNOSIS — G4733 Obstructive sleep apnea (adult) (pediatric): Secondary | ICD-10-CM

## 2016-10-25 NOTE — Procedures (Signed)
  Patient Name: Nancy [name], Nancy [name] Date: 10/22/2016 Gender: Female D.O.B: [birth year] Age (years): 18 Referring Provider: Nischal Narendra Height (inches): 63 Interpreting Physician: Jetty Duhamel MD, ABSM Weight (lbs): 263 RPSGT: Cherylann Parr BMI: 47 MRN: [id number] Neck Size: 19.00 CLINICAL INFORMATION Sleep Study Type: NPSG  Indication for sleep study: Fatigue, Obesity, Snoring, Witnesses Apnea / Gasping During Sleep  Epworth Sleepiness Score: 9/24  SLEEP STUDY TECHNIQUE As per the AASM Manual for the Scoring of Sleep and Associated Events v2.3 (April 2016) with a hypopnea requiring 4% desaturations.  The channels recorded and monitored were frontal, central and occipital EEG, electrooculogram (EOG), submentalis EMG (chin), nasal and oral airflow, thoracic and abdominal wall motion, anterior tibialis EMG, snore microphone, electrocardiogram, and pulse oximetry.  MEDICATIONS Medications self-administered by patient taken the night of the study : none reported  SLEEP ARCHITECTURE The study was initiated at 10:07:52 PM and ended at 5:00:43 AM.  Sleep onset time was 19.0 minutes and the sleep efficiency was 86.9%. The total sleep time was 358.9 minutes.  Stage REM latency was 71.0 minutes.  The patient spent 3.90% of the night in stage N1 sleep, 63.08% in stage N2 sleep, 11.15% in stage N3 and 21.87% in REM.  Alpha intrusion was absent.  Supine sleep was 0.00%.  RESPIRATORY PARAMETERS The overall apnea/hypopnea index (AHI) was 46.8 per hour. There were 50 total apneas, including 50 obstructive, 0 central and 0 mixed apneas. There were 230 hypopneas and 0 RERAs.  The AHI during Stage REM sleep was 84.1 per hour.  AHI while supine was N/A per hour.  The mean oxygen saturation was 90.45%. The minimum SpO2 during sleep was 72.00%.  Loud snoring was noted during this study.  CARDIAC DATA The 2 lead EKG demonstrated sinus rhythm. The mean heart rate was 70.02 beats  per minute. Other EKG findings include: None.  LEG MOVEMENT DATA The total PLMS were 0 with a resulting PLMS index of 0.00. Associated arousal with leg movement index was 0.0 .  IMPRESSIONS - Severe obstructive sleep apnea occurred during this study (AHI = 46.8/h). - No significant central sleep apnea occurred during this study (CAI = 0.0/h). - Moderate oxygen desaturation was noted during this study (Min O2 = 72.00%). - The patient snored with Loud snoring volume. - No cardiac abnormalities were noted during this study. - Clinically significant periodic limb movements did not occur during sleep. No significant associated arousals.  DIAGNOSIS - Obstructive Sleep Apnea (327.23 [G47.33 ICD-10])  RECOMMENDATIONS - Therapeutic CPAP titration to determine optimal pressure required to alleviate sleep disordered breathing. - Be careful with alcohol, sedatives and other CNS depressants that may worsen sleep apnea and disrupt normal sleep architecture. - Sleep hygiene should be reviewed to assess factors that may improve sleep quality. - Weight management and regular exercise should be initiated or continued if appropriate.  [Electronically signed] 10/25/2016 03:37 PM  Jetty Duhamel MD, ABSM Diplomate, American Board of Sleep Medicine   NPI: [id number]  Waymon Budge Diplomate, American Board of Sleep Medicine  ELECTRONICALLY SIGNED ON:  10/25/2016, 3:34 PM [facility] PH: [phone]   FX: [phone] ACCREDITED BY THE AMERICAN ACADEMY OF SLEEP MEDICINE

## 2016-11-13 ENCOUNTER — Other Ambulatory Visit: Payer: Self-pay | Admitting: Internal Medicine

## 2016-11-13 DIAGNOSIS — I10 Essential (primary) hypertension: Secondary | ICD-10-CM

## 2016-11-13 DIAGNOSIS — M25562 Pain in left knee: Secondary | ICD-10-CM

## 2016-11-13 DIAGNOSIS — G4733 Obstructive sleep apnea (adult) (pediatric): Secondary | ICD-10-CM | POA: Diagnosis not present

## 2016-11-13 DIAGNOSIS — M25561 Pain in right knee: Secondary | ICD-10-CM

## 2016-11-13 DIAGNOSIS — M2550 Pain in unspecified joint: Secondary | ICD-10-CM | POA: Diagnosis not present

## 2016-11-17 ENCOUNTER — Ambulatory Visit (INDEPENDENT_AMBULATORY_CARE_PROVIDER_SITE_OTHER): Payer: Medicare HMO | Admitting: Internal Medicine

## 2016-11-17 ENCOUNTER — Encounter: Payer: Self-pay | Admitting: Internal Medicine

## 2016-11-17 VITALS — BP 144/74 | HR 81 | Temp 98.4°F | Ht 63.0 in | Wt 265.1 lb

## 2016-11-17 DIAGNOSIS — M25562 Pain in left knee: Secondary | ICD-10-CM | POA: Diagnosis not present

## 2016-11-17 DIAGNOSIS — G8929 Other chronic pain: Secondary | ICD-10-CM

## 2016-11-17 DIAGNOSIS — M25561 Pain in right knee: Secondary | ICD-10-CM

## 2016-11-17 DIAGNOSIS — Z9989 Dependence on other enabling machines and devices: Secondary | ICD-10-CM

## 2016-11-17 DIAGNOSIS — I10 Essential (primary) hypertension: Secondary | ICD-10-CM | POA: Diagnosis not present

## 2016-11-17 DIAGNOSIS — D509 Iron deficiency anemia, unspecified: Secondary | ICD-10-CM

## 2016-11-17 DIAGNOSIS — E039 Hypothyroidism, unspecified: Secondary | ICD-10-CM

## 2016-11-17 DIAGNOSIS — M549 Dorsalgia, unspecified: Secondary | ICD-10-CM

## 2016-11-17 DIAGNOSIS — Z79891 Long term (current) use of opiate analgesic: Secondary | ICD-10-CM | POA: Diagnosis not present

## 2016-11-17 DIAGNOSIS — Z79899 Other long term (current) drug therapy: Secondary | ICD-10-CM | POA: Diagnosis not present

## 2016-11-17 DIAGNOSIS — Z0000 Encounter for general adult medical examination without abnormal findings: Secondary | ICD-10-CM

## 2016-11-17 DIAGNOSIS — G4733 Obstructive sleep apnea (adult) (pediatric): Secondary | ICD-10-CM

## 2016-11-17 MED ORDER — DICLOFENAC SODIUM 1 % TD GEL: 4.0000 g | Freq: Four times a day (QID) | TRANSDERMAL | 3 refills | Status: DC

## 2016-11-17 NOTE — Assessment & Plan Note (Signed)
-   Patient refused PAP smear and flu shot today

## 2016-11-17 NOTE — Assessment & Plan Note (Signed)
-   Patient with baseline Hg in the 10s - Will recheck CBC today given worsening fatigue but I suspect this is secondary to her OSA
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
## 2016-11-17 NOTE — Assessment & Plan Note (Signed)
-   This problem is chronic and stable - Patient has been fatigued but I suspect it is secondary to obstructive sleep apnea rather than hypothyroidism. - Patient had her TSH checked in June and was at goal - Continue with Synthroid at current dose

## 2016-11-17 NOTE — Patient Instructions (Addendum)
-   It was a pleasure seeing you today - Please follow up in 3 months. - I have prescribed voltaren gel for your knee pain and refilled your tylenol 3 - I have also referred you to sports medicine for your knee pain - I have also referred you for titration of your CPAP machine - We will check a urine and blood test today
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
## 2016-11-17 NOTE — Telephone Encounter (Signed)
Tylenol #3 rx called to [facility].

## 2016-11-17 NOTE — Assessment & Plan Note (Signed)
BP Readings from Last 3 Encounters:  11/17/16 (!) 144/74  10/17/16 (!) 197/93  08/11/16 (!) 157/73    Lab Results  Component Value Date   NA 137 08/11/2016   K 3.9 08/11/2016   CREATININE 0.91 08/11/2016    Assessment: Blood pressure control:  fair Progress toward BP goal:   improved Comments: Patient is compliant with metoprolol 50 mg, lisinopril 40 mg and chlorthalidone 50 mg  Plan: Medications:  continue current medications Educational resources provided: brochure (denies need ) Self management tools provided:   Other plans:

## 2016-11-17 NOTE — Assessment & Plan Note (Signed)
-   Patient still complains of daytime fatigue and states that her CPAP settings are too high and give her chest pain - She had a recent sleep study done which showed severe obstructive sleep apnea - I have ordered a CPAP titration study. Patient will follow-up with this and get her CPAP settings adjusted accordingly

## 2016-11-18 ENCOUNTER — Telehealth: Payer: Self-pay | Admitting: Internal Medicine

## 2016-11-18 LAB — CBC
Hematocrit: 36.8 % (ref 34.0–46.6)
Hemoglobin: 11.1 g/dL (ref 11.1–15.9)
MCH: 23.9 pg — ABNORMAL LOW (ref 26.6–33.0)
MCHC: 30.2 g/dL — ABNORMAL LOW (ref 31.5–35.7)
MCV: 79 fL (ref 79–97)
Platelets: 197 10*3/uL (ref 150–379)
RBC: 4.64 x10E6/uL (ref 3.77–5.28)
RDW: 16.3 % — ABNORMAL HIGH (ref 12.3–15.4)
WBC: 3.5 10*3/uL (ref 3.4–10.8)

## 2016-11-18 NOTE — Telephone Encounter (Signed)
I called the patient today to discuss her lab work from yesterday. Patient's hemoglobin is now normalized and the likely cause of her fatigue is obstructive sleep apnea. Patient had a referral put in for her CPAP titration. We will call her when this appointment is made. Patient states that the Voltaren gel is helping a little bit with the pain. No further workup for now. Patient expresses understanding and is in agreement with plan.

## 2016-11-19 ENCOUNTER — Other Ambulatory Visit: Payer: Self-pay | Admitting: Internal Medicine

## 2016-11-19 DIAGNOSIS — J309 Allergic rhinitis, unspecified: Secondary | ICD-10-CM

## 2016-11-22 LAB — TOXASSURE SELECT,+ANTIDEPR,UR

## 2016-11-29 ENCOUNTER — Ambulatory Visit (HOSPITAL_BASED_OUTPATIENT_CLINIC_OR_DEPARTMENT_OTHER): Payer: Medicare HMO | Attending: Internal Medicine | Admitting: Internal Medicine

## 2016-11-29 VITALS — Ht 63.0 in | Wt 269.0 lb

## 2016-11-29 DIAGNOSIS — G4733 Obstructive sleep apnea (adult) (pediatric): Secondary | ICD-10-CM | POA: Diagnosis present

## 2016-12-02 ENCOUNTER — Ambulatory Visit (INDEPENDENT_AMBULATORY_CARE_PROVIDER_SITE_OTHER): Payer: Medicare HMO | Admitting: Family Medicine

## 2016-12-02 DIAGNOSIS — M2550 Pain in unspecified joint: Secondary | ICD-10-CM | POA: Insufficient documentation

## 2016-12-02 MED ORDER — PREDNISONE 5 MG (21) PO TBPK: ORAL_TABLET | ORAL | 0 refills | Status: DC
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
## 2016-12-05 DIAGNOSIS — G4733 Obstructive sleep apnea (adult) (pediatric): Secondary | ICD-10-CM

## 2016-12-05 NOTE — Procedures (Signed)
  Patient Name: Nancy [name], Nancy [name] Date: 11/29/2016 Gender: Female D.O.B: Feb 09, 1956 Age (years): 27 Referring Provider: Nischal Narendra Height (inches): 63 Interpreting Physician: Jetty Duhamel MD, ABSM Weight (lbs): 263 RPSGT: Cherylann Parr BMI: 47 MRN: [id number] Neck Size: 19.00 CLINICAL INFORMATION The patient is referred for a CPAP titration to treat sleep apnea.  Date of NPSG, Split Night or HST:  Diagnostic NPSG 10/22/16  AHI 46.8/ hr, desaturation to 72%, body weight 263 lbs  SLEEP STUDY TECHNIQUE As per the AASM Manual for the Scoring of Sleep and Associated Events v2.3 (April 2016) with a hypopnea requiring 4% desaturations.  The channels recorded and monitored were frontal, central and occipital EEG, electrooculogram (EOG), submentalis EMG (chin), nasal and oral airflow, thoracic and abdominal wall motion, anterior tibialis EMG, snore microphone, electrocardiogram, and pulse oximetry. Continuous positive airway pressure (CPAP) was initiated at the beginning of the study and titrated to treat sleep-disordered breathing.  MEDICATIONS Medications self-administered by patient taken the night of the study : none reported  TECHNICIAN COMMENTS Comments added by technician: Patient had difficulty initiating sleep.  Comments added by scorer: N/A RESPIRATORY PARAMETERS Optimal PAP Pressure (cm): 18 AHI at Optimal Pressure (/hr): 0.0 Overall Minimal O2 (%): 77.00 Supine % at Optimal Pressure (%): 0 Minimal O2 at Optimal Pressure (%): 91.0   SLEEP ARCHITECTURE The study was initiated at 11:15:14 PM and ended at 5:27:25 AM.  Sleep onset time was 13.9 minutes and the sleep efficiency was 86.2%. The total sleep time was 321.0 minutes.  The patient spent 6.39% of the night in stage N1 sleep, 51.56% in stage N2 sleep, 0.00% in stage N3 and 42.06% in REM.Stage REM latency was 60.0 minutes  Wake after sleep onset was 37.3. Alpha intrusion was absent. Supine sleep was  0.00%.  CARDIAC DATA The 2 lead EKG demonstrated sinus rhythm. The mean heart rate was 63.88 beats per minute. Other EKG findings include: None . LEG MOVEMENT DATA The total Periodic Limb Movements of Sleep (PLMS) were 0. The PLMS index was 0.00. A PLMS index of <15 is considered normal in adults.  IMPRESSIONS - The optimal PAP pressure was 18 cm of water. - Central sleep apnea was not noted during this titration (CAI = 0.0/h). - Oxygen desaturations were observed during this titration (min O2 = 77.00%). - No snoring was audible during this study. - No cardiac abnormalities were observed during this study. - Clinically significant periodic limb movements were not noted during this study. Arousals associated with PLMs were rare.  DIAGNOSIS - Obstructive Sleep Apnea (327.23 [G47.33 ICD-10])  RECOMMENDATIONS - Trial of CPAP therapy on 18 cm H2O with a Medium size Fisher&Paykel Full Face Mask Simplus mask and heated humidification. - Be careful with alcohol, sedatives and other CNS depressants that may worsen sleep apnea and disrupt normal sleep architecture. - Sleep hygiene should be reviewed to assess factors that may improve sleep quality. - Weight management and regular exercise should be initiated or continued.  [Electronically signed] 12/05/2016 11:40 AM  Jetty Duhamel MD, ABSM Diplomate, American Board of Sleep Medicine   NPI: [id number]  Waymon Budge Diplomate, American Board of Sleep Medicine  ELECTRONICALLY SIGNED ON:  12/05/2016, 11:32 AM [facility] PH: [phone]   FX: [phone] ACCREDITED BY THE AMERICAN ACADEMY OF SLEEP MEDICINE

## 2016-12-13 DIAGNOSIS — M2550 Pain in unspecified joint: Secondary | ICD-10-CM | POA: Diagnosis not present

## 2016-12-13 DIAGNOSIS — G4733 Obstructive sleep apnea (adult) (pediatric): Secondary | ICD-10-CM | POA: Diagnosis not present

## 2016-12-14 ENCOUNTER — Other Ambulatory Visit: Payer: Self-pay | Admitting: Internal Medicine

## 2016-12-14 DIAGNOSIS — I10 Essential (primary) hypertension: Secondary | ICD-10-CM

## 2016-12-17 ENCOUNTER — Ambulatory Visit (INDEPENDENT_AMBULATORY_CARE_PROVIDER_SITE_OTHER): Payer: Medicare HMO | Admitting: Internal Medicine

## 2016-12-17 DIAGNOSIS — M170 Bilateral primary osteoarthritis of knee: Secondary | ICD-10-CM | POA: Diagnosis not present

## 2016-12-17 DIAGNOSIS — G8929 Other chronic pain: Secondary | ICD-10-CM

## 2016-12-17 DIAGNOSIS — M5136 Other intervertebral disc degeneration, lumbar region: Secondary | ICD-10-CM

## 2016-12-17 DIAGNOSIS — Z026 Encounter for examination for insurance purposes: Secondary | ICD-10-CM

## 2016-12-17 DIAGNOSIS — M545 Low back pain, unspecified: Secondary | ICD-10-CM

## 2016-12-17 DIAGNOSIS — Z993 Dependence on wheelchair: Secondary | ICD-10-CM

## 2016-12-17 MED ORDER — METHOCARBAMOL 500 MG PO TABS: 500.0000 mg | ORAL_TABLET | Freq: Two times a day (BID) | ORAL | 0 refills | Status: DC | PRN

## 2016-12-17 NOTE — Progress Notes (Signed)
   CC: Mobility assessment   HPI:  Nancy [name] is a 61 y.o. female with past medical history outlined below here for mobility assessment. For the details of today's visit, please refer to the assessment and plan.  Past Medical History:  Diagnosis Date  . Back pain    chronic low back pain L4-5 discectomy:11/99. degenerative thoracic spondylotic changes 4/07  . Chest pain    neg adenosine myoview.  . Hypertension    no LVH EKG-7/05, nl M/C ratio 4/07  . Hypothyroidism    09/1998  . Lower extremity edema    echo EF 55-65% w/o evidence of Dias dysfx.,   . Menorrhagia   . Microcytic anemia    09/1998. Hb-10.5/MCV 76. needs ferritin to determine ACD vs IDA  . Morbid obesity (HCC)   . Obstructive sleep apnea    severe 7/05 RD 161 per hr. /CPAP 18cwp  . Polyarthralgia    (knee/back/ankle) w/dx of fibromyalgia? given by [facility] rheum, knee pain chronic 2/2 obesity, fibromyalgia and Sjogren's.  . Primary Sjogren's syndrome (HCC)    anti Ro+;ANA>1:1280 in homogen pattern, negative ds DNA/RF/anti Smith/RNP/C3-4 comp/la/jo1/Scleroderma/centromere, neg HIV/ACE/Hep B/C, nl CXR 2/05, schirmer salivary glandtest not done, symptom rx:eye drops/prednisone/plaquenil referral to [facility] rheum, [name] 3/07. saw [name] but stopped 2/2 cost.  . Uterine bleeding     ROS   Physical Exam:  Vitals:   12/17/16 1328  BP: (!) 154/69  Pulse: 72  Temp: 98.4 F (36.9 C)  TempSrc: Oral  SpO2: 98%  Weight: 263 lb 4.8 oz (119.4 kg)  Height:  (1.6 m)    Constitutional: NAD, appears comfortable Neurological: A&Ox3, CN II - XII grossly intact. Upper extremity strength 4/5 bilaterally. Lower extremity exam limited due to pain. Refuses to move right lower extremity due to knee pain, left lower extremity 4/5. Patient is able to stand with one person assist.   Assessment & Plan:   See Encounters Tab for problem based charting.  Patient seen with Dr. Heide Spark

## 2016-12-17 NOTE — Patient Instructions (Addendum)
Ms. Mccahill,  It was a pleasure to see you today. I have placed the order for your powered wheelchair. Please follow up with your primary doctor for follow up in 3 months. If you have any questions or concerns, call our clinic at [phone] or after hours call [phone] and ask for the internal medicine resident on call. Thank you!  - Dr. Antony Contras

## 2016-12-17 NOTE — Assessment & Plan Note (Addendum)
Patient is here for mobility and power wheelchair assessment. She is currently wheelchair-bound due to degenerative lumbar disease and knee osteoarthritis. Mobility is severely limited due to pain. Patient reports she previously had a power wheelchair but unfortunately broke. Her pain severely limits her ability to participate in mobility related activities of daily living. Patient is able to perform most IADLs independently including dressing, toileting, bathing, grooming and hygiene. However she is limited in her ability to meal prep and participate in chores around the house due to her limited mobility. A powered wheelchair would provide her with more independence with meal prepping and household chores. A cane or walker would not provider her with sufficient or safe support. Due to pain, she is unable to self propel a manual wheelchair. A scooter would be unsafe due to difficulty transferring. Patient is physically and mentally capable of operating a power wheelchair safely in the home. -- Placed DME power wheel chair order

## 2016-12-17 NOTE — Assessment & Plan Note (Signed)
Refilled robaxin PRN x 1.

## 2016-12-18 NOTE — Progress Notes (Signed)
Internal Medicine Clinic Attending  I saw and evaluated the patient.  I personally confirmed the key portions of the history and exam documented by Dr. Guilloud and I reviewed pertinent patient test results.  The assessment, diagnosis, and plan were formulated together and I agree with the documentation in the resident's note.  

## 2017-01-13 DIAGNOSIS — G4733 Obstructive sleep apnea (adult) (pediatric): Secondary | ICD-10-CM | POA: Diagnosis not present

## 2017-01-13 DIAGNOSIS — M2550 Pain in unspecified joint: Secondary | ICD-10-CM | POA: Diagnosis not present

## 2017-01-22 ENCOUNTER — Emergency Department (HOSPITAL_COMMUNITY): Payer: Medicare HMO

## 2017-01-22 ENCOUNTER — Emergency Department (HOSPITAL_COMMUNITY)
Admission: EM | Admit: 2017-01-22 | Discharge: 2017-01-22 | Disposition: A | Discharge status: Home / Self Care | Payer: Medicare HMO | Attending: Emergency Medicine | Admitting: Emergency Medicine

## 2017-01-22 ENCOUNTER — Other Ambulatory Visit: Payer: Self-pay

## 2017-01-22 ENCOUNTER — Encounter (HOSPITAL_COMMUNITY): Payer: Self-pay | Admitting: *Deleted

## 2017-01-22 DIAGNOSIS — E039 Hypothyroidism, unspecified: Secondary | ICD-10-CM | POA: Insufficient documentation

## 2017-01-22 DIAGNOSIS — B349 Viral infection, unspecified: Secondary | ICD-10-CM | POA: Insufficient documentation

## 2017-01-22 DIAGNOSIS — I10 Essential (primary) hypertension: Secondary | ICD-10-CM | POA: Diagnosis not present

## 2017-01-22 DIAGNOSIS — R079 Chest pain, unspecified: Secondary | ICD-10-CM | POA: Diagnosis not present

## 2017-01-22 DIAGNOSIS — R05 Cough: Secondary | ICD-10-CM | POA: Diagnosis present

## 2017-01-22 DIAGNOSIS — Z79899 Other long term (current) drug therapy: Secondary | ICD-10-CM | POA: Insufficient documentation

## 2017-01-22 DIAGNOSIS — R0602 Shortness of breath: Secondary | ICD-10-CM | POA: Diagnosis not present

## 2017-01-22 DIAGNOSIS — R6889 Other general symptoms and signs: Secondary | ICD-10-CM

## 2017-01-22 DIAGNOSIS — J111 Influenza due to unidentified influenza virus with other respiratory manifestations: Secondary | ICD-10-CM | POA: Diagnosis not present

## 2017-01-22 DIAGNOSIS — J069 Acute upper respiratory infection, unspecified: Secondary | ICD-10-CM

## 2017-01-22 DIAGNOSIS — B9789 Other viral agents as the cause of diseases classified elsewhere: Secondary | ICD-10-CM

## 2017-01-22 MED ORDER — ALBUTEROL SULFATE HFA 108 (90 BASE) MCG/ACT IN AERS: 1.0000 | INHALATION_SPRAY | Freq: Four times a day (QID) | RESPIRATORY_TRACT | 0 refills | Status: DC | PRN

## 2017-01-22 MED ORDER — OSELTAMIVIR PHOSPHATE 75 MG PO CAPS: 75.0000 mg | ORAL_CAPSULE | Freq: Two times a day (BID) | ORAL | 0 refills | Status: DC

## 2017-01-22 MED ORDER — MUCINEX DM MAXIMUM STRENGTH 60-1200 MG PO TB12: 1.0000 | ORAL_TABLET | Freq: Two times a day (BID) | ORAL | 0 refills | Status: DC | PRN

## 2017-01-22 NOTE — ED Provider Notes (Signed)
[facility] EMERGENCY [facility] Provider Note   CSN: [id number] Arrival date & time: 01/22/17  1053     History   Chief Complaint Chief Complaint  Patient presents with  . Cough    HPI Rudene ChristiansLinda S Kessinger is a 61 y.o. female who presents with 2 days of URI sxs that include cough, bodyaches, She has been using Robitussin which has been helpful.  She did have a fever at onset of her symptoms which was 101.6 at home.  She took Tylenol and the fever resolved and has not returned.  She complains of nasal congestion, body aches, mild wheezing.  She has a history of reactive airway with URIs and has used an inhaler in the past but does not have one presently.  She denies any nausea, vomiting.  She did not get her flu vaccination this year.  HPI  Past Medical History:  Diagnosis Date  . Back pain    chronic low back pain L4-5 discectomy:11/99. degenerative thoracic spondylotic changes 4/07  . Chest pain    neg adenosine myoview.  . Hypertension    no LVH EKG-7/05, nl M/C ratio 4/07  . Hypothyroidism    09/1998  . Lower extremity edema    echo EF 55-65% w/o evidence of Dias dysfx.,   . Menorrhagia   . Microcytic anemia    09/1998. Hb-10.5/MCV 76. needs ferritin to determine ACD vs IDA  . Morbid obesity (HCC)   . Obstructive sleep apnea    severe 7/05 RD 161 per hr. /CPAP 18cwp  . Polyarthralgia    (knee/back/ankle) w/dx of fibromyalgia? [facility], knee pain chronic 2/2 obesity, fibromyalgia and Sjogren's.  . Primary Sjogren's syndrome (HCC)    anti Ro+;ANA>1:1280 in homogen pattern, negative ds DNA/RF/anti Smith/RNP/C3-4 comp/la/jo1/Scleroderma/centromere, neg HIV/ACE/Hep B/C, nl CXR 2/05, schirmer salivary glandtest not done, symptom rx:eye drops/prednisone/plaquenil [facility], Dr. Rushie NyhanBourne/o'Rourke 3/07. saw [name] but stopped 2/2 cost.  . Uterine bleeding     Patient Active Problem List   Diagnosis Date Noted  . Wheelchair  dependence 12/17/2016  . Polyarthralgia 12/02/2016  . Healthcare maintenance 12/18/2014  . GERD (gastroesophageal reflux disease) 04/13/2014  . Allergic rhinitis 01/24/2014  . Severe obesity (BMI >= 40) (HCC) 09/14/2013  . Abnormal CT of brain 11/19/2011  . MACULAR DEGENERATION, BILATERAL 05/22/2008  . Essential hypertension 03/17/2006  . SICCA SYNDROME 03/17/2006  . KNEE PAIN, CHRONIC 03/17/2006  . Acute exacerbation of chronic low back pain 03/17/2006  . Obstructive sleep apnea 09/07/2003  . Hypothyroidism 09/07/1998  . ANEMIA, IRON DEFICIENCY NOS 09/07/1998    Past Surgical History:  Procedure Laterality Date  . DILATATION AND CURETTAGE /HYSTEROSCOPY N/A 11/23/2011   Performed by Tereso NewcomerAnyanwu, Ugonna A[facility]  . EXAM UNDER ANESTHESIA N/A 11/23/2011   Performed by Tereso NewcomerAnyanwu[facility] Ug[facility]  . LUMBAR FUSION  1999  . SHOULDER SURGERY  unk  . TONSILLECTOMY    . TUBAL LIGATION  1977  . WISDOM TOOTH EXTRACTION      OB History    No data available       Home Medications    Prior to Admission medications   Medication Sig Start Date End Date Taking? Authorizing Provider  chlorthalidone (HYGROTON) 50 MG tablet Take 1 tablet (50 mg total) by mouth daily. 04/28/16  Yes Burns, Alexa R, MD  diclofenac sodium (VOLTAREN) 1 % GEL Apply 4 g topically 4 (four) times daily. 11/17/16  Yes Earl LagosNarendra, Nischal, MD  guaifenesin (ROBITUSSIN) 100 MG/5ML syrup Take 200 mg 3 (three) times daily as needed by mouth for cough.   Yes [provider]  levothyroxine (SYNTHROID, LEVOTHROID) 50 MCG tablet TAKE 1 TABLET BY MOUTH EVERY DAY. Void previous Rx's on file for levothyroxine. 04/08/15  Yes Jana Halfaylor, Nicholas A, MD  lisinopril (PRINIVIL,ZESTRIL) 40 MG tablet Take 1 tablet (40 mg total) by mouth daily. 07/01/16 07/01/17 Yes Earl LagosNarendra, Nischal, MD  loratadine (CLARITIN) 10 MG tablet TAKE 1 TABLET BY MOUTH EVERY DAY 11/19/16  Yes Earl LagosNarendra, Nischal, MD  methocarbamol (ROBAXIN) 500 MG tablet Take  1 tablet (500 mg total) by mouth 2 (two) times daily as needed for muscle spasms. 12/17/16  Yes Reymundo PollGuilloud, Carolyn, MD  metoprolol tartrate (LOPRESSOR) 50 MG tablet TAKE 1 TABLET BY MOUTH TWICE A DAY 12/14/16  Yes Narendra, Nischal, MD  acetaminophen-codeine (TYLENOL #3) 300-30 MG tablet TAKE 1 TABLET BY MOUTH EVERY 4 HOURS AS NEEDED FOR PAIN Patient not taking: Reported on 01/22/2017 11/16/16   Earl LagosNarendra, Nischal, MD  albuterol (PROVENTIL HFA;VENTOLIN HFA) 108 (90 Base) MCG/ACT inhaler Inhale 1-2 puffs every 6 (six) hours as needed into the lungs for wheezing or shortness of breath. 01/22/17   Arthor CaptainHarris, Jasim Harari, PA-C  Dextromethorphan-Guaifenesin (MUCINEX DM MAXIMUM STRENGTH) 60-1200 MG TB12 Take 1 tablet every 12 (twelve) hours as needed by mouth. 01/22/17   Daaron Dimarco, PA-C  fluticasone (FLONASE) 50 MCG/ACT nasal spray Place 2 sprays into both nostrils daily. 11/22/15 11/21/16  Arnetha CourserAmin, Sumayya, MD  oseltamivir (TAMIFLU) 75 MG capsule Take 1 capsule (75 mg total) every 12 (twelve) hours by mouth. 01/22/17   Arthor CaptainHarris, Taiyana Kissler, PA-C    Family History Family History  Problem Relation Age of Onset  . Hypertension Mother   . Cancer Mother   . Cancer Father   . Cancer Sister   . Ovarian cancer Unknown        family history  . Breast cancer Unknown        family history in 1st degree relatives.  . Thyroid disease Sister   . Colon cancer Neg Hx   . Stomach cancer Neg Hx     Social History Social History   Tobacco Use  . Smoking status: Never Smoker  . Smokeless tobacco: Never Used  Substance Use Topics  . Alcohol use: No    Alcohol/week: 0.0 oz  . Drug use: No     Allergies   Amlodipine; Coconut fatty acids; and Naproxen sodium   Review of Systems Review of Systems  Ten systems reviewed and are negative for acute change, except as noted in the HPI.   Physical Exam Updated Vital Signs BP (!) 156/77 (BP Location: Right Arm)   Pulse 86   Temp 98.8 F (37.1 C) (Oral)   Resp 20    Ht 5\' 3"  (1.6 m)   Wt 117.9 kg (260 lb)   SpO2 98%   BMI 46.06 kg/m   Physical Exam  Constitutional: She is oriented to person, place, and time. She appears well-developed and well-nourished. No distress.  HENT:  Head: Normocephalic and atraumatic.  Nose: Mucosal edema present.  Mouth/Throat: Uvula is midline and oropharynx is clear and moist.  Eyes: Conjunctivae and EOM are normal. Pupils are equal, round, and reactive to light. No scleral icterus.  Neck: Normal range of motion.  Cardiovascular: Normal rate, regular rhythm and normal heart sounds. Exam reveals no gallop and no friction rub.  No murmur heard. Pulmonary/Chest: Effort normal. No respiratory distress. She has wheezes ( Mild expiratory  wheeze with cough).  Abdominal: Soft. Bowel sounds are normal. She exhibits no distension and no mass. There is no tenderness. There is no guarding.  Neurological: She is alert and oriented to person, place, and time.  Skin: Skin is warm and dry. She is not diaphoretic.  Psychiatric: Her behavior is normal.  Nursing note and vitals reviewed.    ED Treatments / Results  Labs (all labs ordered are listed, but only abnormal results are displayed) Labs Reviewed - No data to display  EKG  EKG Interpretation None       Radiology Dg Chest 2 View  Result Date: 01/22/2017 CLINICAL DATA:  Chest pain, cough, shortness of breath, fever EXAM: CHEST  2 VIEW COMPARISON:  11/14/2014 FINDINGS: Mild elevation of the right hemidiaphragm, stable. Linear subsegmental atelectasis at the right lung base. Left lung is clear. Heart is normal size. No effusions or acute bony abnormality. IMPRESSION: Stable mild elevation of the right hemidiaphragm. Minimal right base atelectasis. Electronically Signed   By: Charlett Nose M.D.   On: 01/22/2017 11:59    Procedures Procedures (including critical care time)  Medications Ordered in ED Medications - No data to display   Initial Impression / Assessment  and Plan / ED Course  I have reviewed the triage vital signs and the nursing notes.  Pertinent labs & imaging results that were available during my care of the patient were reviewed by me and considered in my medical decision making (see chart for details).    Pt CXR negative for acute infiltrate. Patients symptoms are consistent with URI, likely viral etiology. Discussed that antibiotics are not indicated for viral infections. Pt will be discharged with symptomatic treatment.  Verbalizes understanding and is agreeable with plan. Pt is hemodynamically stable & in NAD prior to dc.   Final Clinical Impressions(s) / ED Diagnoses   Final diagnoses:  Flu-like symptoms  Viral URI with cough    ED Discharge Orders        Ordered    oseltamivir (TAMIFLU) 75 MG capsule  Every 12 hours     01/22/17 1730    albuterol (PROVENTIL HFA;VENTOLIN HFA) 108 (90 Base) MCG/ACT inhaler  Every 6 hours PRN     01/22/17 1730    Dextromethorphan-Guaifenesin (MUCINEX DM MAXIMUM STRENGTH) 60-1200 MG TB12  Every 12 hours PRN     01/22/17 1730       Arthor Captain, PA-C 01/22/17 1754    Benjiman Core, MD 01/22/17 2325

## 2017-01-22 NOTE — ED Triage Notes (Signed)
Pt c/o sore throat, body aches, productive cough with green sputum onset x 3 days, ago, pt A&O x4

## 2017-01-22 NOTE — Discharge Instructions (Signed)
Take tylenol or motrin for pain. Benadryl or unisom for sleep.  You appear to have an upper respiratory infection (URI). An upper respiratory tract infection, or cold, is a viral infection of the air passages leading to the lungs. It is contagious and can be spread to others, especially during the first 3 or 4 days. It cannot be cured by antibiotics or other medicines. RETURN IMMEDIATELY IF you develop shortness of breath, confusion or altered mental status, a new rash, become dizzy, faint, or poorly responsive, or are unable to be cared for at home.

## 2017-02-08 DIAGNOSIS — M2550 Pain in unspecified joint: Secondary | ICD-10-CM | POA: Diagnosis not present

## 2017-02-08 DIAGNOSIS — G4733 Obstructive sleep apnea (adult) (pediatric): Secondary | ICD-10-CM | POA: Diagnosis not present

## 2017-02-11 ENCOUNTER — Other Ambulatory Visit: Payer: Self-pay | Admitting: Internal Medicine

## 2017-02-11 DIAGNOSIS — M25561 Pain in right knee: Secondary | ICD-10-CM

## 2017-02-11 DIAGNOSIS — M25562 Pain in left knee: Principal | ICD-10-CM

## 2017-02-11 NOTE — Telephone Encounter (Signed)
Patient is requesting refill on pain medicine °

## 2017-02-12 DIAGNOSIS — M2550 Pain in unspecified joint: Secondary | ICD-10-CM | POA: Diagnosis not present

## 2017-02-12 DIAGNOSIS — G4733 Obstructive sleep apnea (adult) (pediatric): Secondary | ICD-10-CM | POA: Diagnosis not present

## 2017-02-12 MED ORDER — ACETAMINOPHEN-CODEINE #3 300-30 MG PO TABS: 1.0000 | ORAL_TABLET | ORAL | 0 refills | Status: DC | PRN

## 2017-02-12 NOTE — Telephone Encounter (Signed)
Reviewed contract and most recent PCP note. Will fill #20 no RF

## 2017-02-12 NOTE — Telephone Encounter (Signed)
[facility] 

## 2017-03-15 DIAGNOSIS — M2550 Pain in unspecified joint: Secondary | ICD-10-CM | POA: Diagnosis not present

## 2017-03-15 DIAGNOSIS — G4733 Obstructive sleep apnea (adult) (pediatric): Secondary | ICD-10-CM | POA: Diagnosis not present

## 2017-03-16 ENCOUNTER — Other Ambulatory Visit: Payer: Self-pay | Admitting: Internal Medicine

## 2017-03-16 DIAGNOSIS — M25561 Pain in right knee: Secondary | ICD-10-CM

## 2017-03-16 DIAGNOSIS — M25562 Pain in left knee: Principal | ICD-10-CM

## 2017-03-17 ENCOUNTER — Other Ambulatory Visit: Payer: Self-pay | Admitting: *Deleted

## 2017-03-17 DIAGNOSIS — E039 Hypothyroidism, unspecified: Secondary | ICD-10-CM

## 2017-03-17 MED ORDER — LEVOTHYROXINE SODIUM 50 MCG PO TABS: ORAL_TABLET | ORAL | 1 refills | Status: DC

## 2017-04-09 ENCOUNTER — Other Ambulatory Visit: Payer: Self-pay | Admitting: Internal Medicine

## 2017-04-09 DIAGNOSIS — M25561 Pain in right knee: Secondary | ICD-10-CM

## 2017-04-09 DIAGNOSIS — M25562 Pain in left knee: Principal | ICD-10-CM

## 2017-04-09 NOTE — Telephone Encounter (Signed)
Patient requesting refill on tylenol#3 [facility] [address] please

## 2017-04-09 NOTE — Telephone Encounter (Signed)
Next appt scheduled 2/19 with PCP. 

## 2017-04-12 MED ORDER — ACETAMINOPHEN-CODEINE #3 300-30 MG PO TABS: ORAL_TABLET | ORAL | 0 refills | Status: DC

## 2017-04-19 ENCOUNTER — Other Ambulatory Visit: Payer: Self-pay | Admitting: Internal Medicine

## 2017-04-19 DIAGNOSIS — I10 Essential (primary) hypertension: Secondary | ICD-10-CM

## 2017-04-19 NOTE — Telephone Encounter (Signed)
Next appt scheduled 2/19 with PCP. 

## 2017-04-26 DIAGNOSIS — G4733 Obstructive sleep apnea (adult) (pediatric): Secondary | ICD-10-CM | POA: Diagnosis not present

## 2017-04-26 DIAGNOSIS — M2550 Pain in unspecified joint: Secondary | ICD-10-CM | POA: Diagnosis not present

## 2017-04-26 DIAGNOSIS — M159 Polyosteoarthritis, unspecified: Secondary | ICD-10-CM | POA: Diagnosis not present

## 2017-04-27 ENCOUNTER — Ambulatory Visit: Payer: Medicare HMO | Admitting: Internal Medicine

## 2017-06-01 ENCOUNTER — Encounter: Payer: Medicare HMO | Admitting: Internal Medicine

## 2017-06-02 NOTE — Progress Notes (Signed)
This encounter was created in error - please disregard.

## 2017-07-09 ENCOUNTER — Encounter: Payer: Self-pay | Admitting: Internal Medicine

## 2017-07-09 ENCOUNTER — Ambulatory Visit (INDEPENDENT_AMBULATORY_CARE_PROVIDER_SITE_OTHER): Payer: Medicare HMO | Admitting: Internal Medicine

## 2017-07-09 ENCOUNTER — Other Ambulatory Visit: Payer: Self-pay

## 2017-07-09 DIAGNOSIS — M25569 Pain in unspecified knee: Secondary | ICD-10-CM

## 2017-07-09 DIAGNOSIS — Z79891 Long term (current) use of opiate analgesic: Secondary | ICD-10-CM | POA: Diagnosis not present

## 2017-07-09 DIAGNOSIS — J309 Allergic rhinitis, unspecified: Secondary | ICD-10-CM

## 2017-07-09 DIAGNOSIS — Z9114 Patient's other noncompliance with medication regimen: Secondary | ICD-10-CM

## 2017-07-09 DIAGNOSIS — I10 Essential (primary) hypertension: Secondary | ICD-10-CM | POA: Diagnosis not present

## 2017-07-09 DIAGNOSIS — M25562 Pain in left knee: Secondary | ICD-10-CM

## 2017-07-09 DIAGNOSIS — J301 Allergic rhinitis due to pollen: Secondary | ICD-10-CM | POA: Diagnosis not present

## 2017-07-09 DIAGNOSIS — G8929 Other chronic pain: Secondary | ICD-10-CM

## 2017-07-09 DIAGNOSIS — M25561 Pain in right knee: Secondary | ICD-10-CM

## 2017-07-09 DIAGNOSIS — Z79899 Other long term (current) drug therapy: Secondary | ICD-10-CM

## 2017-07-09 MED ORDER — LISINOPRIL 40 MG PO TABS: 40.0000 mg | ORAL_TABLET | Freq: Every day | ORAL | 11 refills | Status: DC

## 2017-07-09 MED ORDER — CHLORTHALIDONE 50 MG PO TABS: 50.0000 mg | ORAL_TABLET | Freq: Every day | ORAL | 11 refills | Status: DC

## 2017-07-09 MED ORDER — FLUTICASONE PROPIONATE 50 MCG/ACT NA SUSP: 2.0000 | Freq: Every day | NASAL | 0 refills | Status: DC

## 2017-07-09 MED ORDER — ACETAMINOPHEN-CODEINE #3 300-30 MG PO TABS: ORAL_TABLET | ORAL | 0 refills | Status: DC

## 2017-07-09 MED ORDER — LORATADINE 10 MG PO TABS: 10.0000 mg | ORAL_TABLET | Freq: Every day | ORAL | 2 refills | Status: DC

## 2017-07-09 MED ORDER — METOPROLOL TARTRATE 50 MG PO TABS: 50.0000 mg | ORAL_TABLET | Freq: Two times a day (BID) | ORAL | 0 refills | Status: DC

## 2017-07-09 NOTE — Progress Notes (Signed)
CC: cough, headache, rhinorrhea  HPI:  [name] is a 62 y.o. female with PMH below.  She presents with a 4 day history of non productive cough headache and rhinorrhea.  Denies any fevers, chills or sick contacts.    Please see A&P for status of the patient's chronic medical conditions  Past Medical History:  Diagnosis Date  . Back pain    chronic low back pain L4-5 discectomy:11/99. degenerative thoracic spondylotic changes 4/07  . Chest pain    neg adenosine myoview.  . Hypertension    no LVH EKG-7/05, nl M/C ratio 4/07  . Hypothyroidism    09/1998  . Lower extremity edema    echo EF 55-65% w/o evidence of Dias dysfx.,   . Menorrhagia   . Microcytic anemia    09/1998. Hb-10.5/MCV 76. needs ferritin to determine ACD vs IDA  . Morbid obesity (HCC)   . Obstructive sleep apnea    severe 7/05 RD 161 per hr. /CPAP 18cwp  . Polyarthralgia    (knee/back/ankle) w/dx of fibromyalgia? given by [facility] rheum, knee pain chronic 2/2 obesity, fibromyalgia and Sjogren's.  . Primary Sjogren's syndrome (HCC)    anti Ro+;ANA>1:1280 in homogen pattern, negative ds DNA/RF/anti Smith/RNP/C3-4 comp/la/jo1/Scleroderma/centromere, neg HIV/ACE/Hep B/C, nl CXR 2/05, schirmer salivary glandtest not done, symptom rx:eye drops/prednisone/plaquenil referral to [facility] rheum, [name] 3/07. saw [name] but stopped 2/2 cost.  . Uterine bleeding    Review of Systems:  ROS: Pulmonary: pt denies increased work of breathing, shortness of breath,  Cardiac: pt denies palpitations, chest pain,  Abdominal: pt denies abdominal pain, nausea, vomiting, or diarrhea  Physical Exam:  Vitals:   07/09/17 1319  BP: (!) 179/87  Pulse: 66  Temp: 98 F (36.7 C)  TempSrc: Oral  SpO2: 98%  Weight: 261 lb (118.4 kg)  Height:  (1.6 m)   Physical Exam  Constitutional: No distress.  HENT:  Nose: Mucosal edema and rhinorrhea present. Right sinus exhibits maxillary sinus tenderness. Left sinus  exhibits maxillary sinus tenderness.  Mouth/Throat: Oropharynx is clear and moist.  Eyes:  photofobia  Cardiovascular: Normal rate, regular rhythm and normal heart sounds. Exam reveals no gallop and no friction rub.  No murmur heard. Pulmonary/Chest: Effort normal and breath sounds normal. No respiratory distress. She has no wheezes. She has no rales. She exhibits no tenderness.  Abdominal: Soft. Bowel sounds are normal. She exhibits no distension and no mass. There is no tenderness. There is no rebound and no guarding.  Neurological: She is alert.  Skin: She is not diaphoretic.    Social History   Socioeconomic History  . Marital status: Married    Spouse name: Not on file  . Number of children: Not on file  . Years of education: Not on file  . Highest education level: Not on file  Occupational History  . Occupation: house wife    Employer: UNEMPLOYED  Social Needs  . Financial resource strain: Not on file  . Food insecurity:    Worry: Not on file    Inability: Not on file  . Transportation needs:    Medical: Not on file    Non-medical: Not on file  Tobacco Use  . Smoking status: Never Smoker  . Smokeless tobacco: Never Used  Substance and Sexual Activity  . Alcohol use: No    Alcohol/week: 0.0 oz  . Drug use: No  . Sexual activity: Yes    Partners: Male    Birth control/protection: Post-menopausal  Lifestyle  .  Physical activity:    Days per week: Not on file    Minutes per session: Not on file  . Stress: Not on file  Relationships  . Social connections:    Talks on phone: Not on file    Gets together: Not on file    Attends religious service: Not on file    Active member of club or organization: Not on file    Attends meetings of clubs or organizations: Not on file    Relationship status: Not on file  . Intimate partner violence:    Fear of current or ex partner: Not on file    Emotionally abused: Not on file    Physically abused: Not on file    Forced  sexual activity: Not on file  Other Topics Concern  . Not on file  Social History Narrative   Her daughter Nancy [name]) often comes with her to visits. She also has a grand daughter and great grand daughter(whose name is Nancy [name])    Family History  Problem Relation Age of Onset  . Hypertension Mother   . Cancer Mother   . Cancer Father   . Cancer Sister   . Ovarian cancer Unknown        family history  . Breast cancer Unknown        family history in 1st degree relatives.  . Thyroid disease Sister   . Colon cancer Neg Hx   . Stomach cancer Neg Hx     Assessment & Plan:   See Encounters Tab for problem based charting.  Patient discussed with Dr. Cleda Daub

## 2017-07-09 NOTE — Patient Instructions (Signed)
Ms. Chimenti you have a headache and cough related to your untreated seasonal allergies.  Please take the claritin daily.  Flonase will also be very helpful for you.  I have written you a prescription for tylenol with codeine for cough and for your headaches.

## 2017-07-10 NOTE — Assessment & Plan Note (Addendum)
BP Readings from Last 3 Encounters:  07/09/17 (!) 179/87  01/22/17 130/75  12/17/16 (!) 154/69  Pts blood pressure out of control today.  Initially she said she is taking her bp medicines, she brought her bottles in with her today.  No chlorthalidone bottle in bag.  Reviewed meds fill date on bottles is November of 2018, bottles are full.  Explained the importance of controlling the patient's blood pressure for not only her overall health but helping prevent these headaches.  Despite this patient says she will take her blood pressure medicines when she feels like it.  -We will refill patient's metoprolol, chlorthalidone and lisinopril in case she changes her mind

## 2017-07-10 NOTE — Assessment & Plan Note (Signed)
Patient not really complaining of arthralgias today.  I am refilling her Tylenol 3 for a very short course for her headache and cough. Unfortunately the refill was attached to this problem and cannot be deleted at this point.

## 2017-07-10 NOTE — Assessment & Plan Note (Signed)
Patient presents with rhinorrhea, sinus pressure, nonproductive cough.  She has had no sick contacts, no fever, chills.  She does endorse a headache.  Is my assessment that the patient is having allergic rhinitis due to the excessive pollen as the weather has warmed.  She reports not liking the Flonase very much I encouraged her to give it another try as it would likely be beneficial.  -Will refill loratadine, Flonase, for her headache I will refill a short course of her Tylenol 3 this will help with the cough as well as her headache.  She reports her cough is keeping her awake at night.

## 2017-07-13 NOTE — Progress Notes (Signed)
Internal Medicine Clinic Attending  Case discussed with Dr. Winfrey  at the time of the visit.  We reviewed the resident's history and exam and pertinent patient test results.  I agree with the assessment, diagnosis, and plan of care documented in the resident's note.  

## 2017-07-14 ENCOUNTER — Other Ambulatory Visit: Payer: Self-pay | Admitting: Internal Medicine

## 2017-08-10 ENCOUNTER — Emergency Department (HOSPITAL_COMMUNITY)
Admission: EM | Admit: 2017-08-10 | Discharge: 2017-08-10 | Disposition: A | Discharge status: Home / Self Care | Payer: Medicare HMO | Attending: Emergency Medicine | Admitting: Emergency Medicine

## 2017-08-10 ENCOUNTER — Encounter (HOSPITAL_COMMUNITY): Payer: Self-pay | Admitting: Emergency Medicine

## 2017-08-10 ENCOUNTER — Other Ambulatory Visit: Payer: Self-pay

## 2017-08-10 DIAGNOSIS — Z79899 Other long term (current) drug therapy: Secondary | ICD-10-CM | POA: Diagnosis not present

## 2017-08-10 DIAGNOSIS — K047 Periapical abscess without sinus: Secondary | ICD-10-CM | POA: Diagnosis not present

## 2017-08-10 DIAGNOSIS — I10 Essential (primary) hypertension: Secondary | ICD-10-CM

## 2017-08-10 DIAGNOSIS — E039 Hypothyroidism, unspecified: Secondary | ICD-10-CM | POA: Insufficient documentation

## 2017-08-10 DIAGNOSIS — K0889 Other specified disorders of teeth and supporting structures: Secondary | ICD-10-CM | POA: Diagnosis present

## 2017-08-10 MED ORDER — PENICILLIN V POTASSIUM 500 MG PO TABS: 500.0000 mg | ORAL_TABLET | Freq: Four times a day (QID) | ORAL | 0 refills | Status: AC

## 2017-08-10 MED ORDER — HYDROCODONE-ACETAMINOPHEN 5-325 MG PO TABS
1.0000 | ORAL_TABLET | Freq: Once | ORAL | Status: AC
  Administered 2017-08-10: 1 via ORAL
  Filled 2017-08-10: qty 1

## 2017-08-10 MED ORDER — HYDROCODONE-ACETAMINOPHEN 5-325 MG PO TABS: 1.0000 | ORAL_TABLET | Freq: Four times a day (QID) | ORAL | 0 refills | Status: DC | PRN

## 2017-08-10 NOTE — ED Triage Notes (Signed)
Patient presents to the ED with complaints of Dental pain. Patient states the pain started yesterday. Patient reports she has several  broken teeth. Patient reports she does not have a Education officer, communitydentist.

## 2017-08-10 NOTE — ED Provider Notes (Signed)
[facility] Provider Note   CSN: [id number] Arrival date & time: 08/10/17  1305     History   Chief Complaint Chief Complaint  Patient presents with  . Dental Pain    HPI Nancy [name] is a 62 y.o. female.  HPI   62 year old female presents today with complaints of dental pain.  Patient notes symptoms started yesterday with left-sided facial swelling, pain in the left upper dentition.  Patient notes that she took Tylenol with codeine in it which did not improve her symptoms.  She notes the swelling has improved, but still has pain in the left upper jaw.  Patient denies any fever, swelling within the mouth presently.  She notes a history of poor dentition, she notes several weeks ago she lost several of her teeth.  Patient notes she does have high blood pressure but did not take her medication today.  Past Medical History:  Diagnosis Date  . Back pain    chronic low back pain L4-5 discectomy:11/99. degenerative thoracic spondylotic changes 4/07  . Chest pain    neg adenosine myoview.  . Hypertension    no LVH EKG-7/05, nl M/C ratio 4/07  . Hypothyroidism    09/1998  . Lower extremity edema    echo EF 55-65% w/o evidence of Dias dysfx.,   . Menorrhagia   . Microcytic anemia    09/1998. Hb-10.5/MCV 76. needs ferritin to determine ACD vs IDA  . Morbid obesity (HCC)   . Obstructive sleep apnea    severe 7/05 RD 161 per hr. /CPAP 18cwp  . Polyarthralgia    (knee/back/ankle) w/dx of fibromyalgia? given [facility] rheum, knee pain chronic 2/2 obesity, fibromyalgia and Sjogren's.  . Primary Sjogren's syndrome (HCC)    anti Ro+;ANA>1:1280 in homogen pattern, negative ds DNA/RF/anti Smith/RNP/C3-4 comp/la/jo1/Scleroderma/centromere, neg HIV/ACE/Hep B/C, nl CXR 2/05, schirmer salivary glandtest not done, symptom rx:eye drops/prednisone/plaquenil [facility], Dr. Rushie NyhanBourne/o'Rourke 3/07. saw [name] but stopped 2/2 cost.  . Uterine  bleeding     Patient Active Problem List   Diagnosis Date Noted  . Wheelchair dependence 12/17/2016  . Polyarthralgia 12/02/2016  . Healthcare maintenance 12/18/2014  . GERD (gastroesophageal reflux disease) 04/13/2014  . Allergic rhinitis 01/24/2014  . Severe obesity (BMI >= 40) (HCC) 09/14/2013  . Abnormal CT of brain 11/19/2011  . MACULAR DEGENERATION, BILATERAL 05/22/2008  . Essential hypertension 03/17/2006  . SICCA SYNDROME 03/17/2006  . KNEE PAIN, CHRONIC 03/17/2006  . Acute exacerbation of chronic low back pain 03/17/2006  . Obstructive sleep apnea 09/07/2003  . Hypothyroidism 09/07/1998  . ANEMIA, IRON DEFICIENCY NOS 09/07/1998    Past Surgical History:  Procedure Laterality Date  . EXAMINATION UNDER ANESTHESIA  11/23/2011   Procedure: EXAM UNDER ANESTHESIA;  Surgeon: Tereso NewcomerUgonna A Anyanwu, MD;  Location: [facility] OR[facility];  Service: Gynecology;  Laterality: N/A;  . HYSTEROSCOPY W/D&C  11/23/2011   Procedure: DILATATION AND CURETTAGE /HYSTEROSCOPY;  Surgeon: Tereso NewcomerUgonna A Anyanwu, MD;  Location: [facility];  Service: Gynecology;  Laterality: N/A;  . LUMBAR FUSION  1999  . SHOULDER SURGERY  unk  . TONSILLECTOMY    . TUBAL LIGATION  1977  . WISDOM TOOTH EXTRACTION       OB History   None      Home Medications    Prior to Admission medications   Medication Sig Start Date End Date Taking? Authorizing Provider  acetaminophen-codeine (TYLENOL #3) 300-30 MG tablet TAKE 1 TABLET EVERY 4 HOURS AS NEEDED FOR PAIN MUST LAST  30 DAYS 07/09/17   Angelita Ingles, MD  albuterol (PROVENTIL HFA;VENTOLIN HFA) 108 (90 Base) MCG/ACT inhaler Inhale 1-2 puffs every 6 (six) hours as needed into the lungs for wheezing or shortness of breath. 01/22/17   Arthor Captain, PA-C  chlorthalidone (HYGROTON) 50 MG tablet Take 1 tablet (50 mg total) by mouth daily. 07/09/17   Angelita Ingles, MD  diclofenac sodium (VOLTAREN) 1 % GEL Apply 4 g topically 4 (four) times daily. 11/17/16   Earl Lagos, MD    fluticasone (FLONASE) 50 MCG/ACT nasal spray Place 2 sprays into both nostrils daily. 07/09/17 07/09/18  Angelita Ingles, MD  HYDROcodone-acetaminophen (NORCO/VICODIN) 5-325 MG tablet Take 1 tablet by mouth every 6 (six) hours as needed. 08/10/17   Porcha Deblanc, Tinnie Gens, PA-C  levothyroxine (SYNTHROID, LEVOTHROID) 50 MCG tablet TAKE 1 TABLET BY MOUTH EVERY DAY. Void previous Rx's on file for levothyroxine. 03/17/17   Earl Lagos, MD  lisinopril (PRINIVIL,ZESTRIL) 40 MG tablet Take 1 tablet (40 mg total) by mouth daily. 07/09/17 07/09/18  Angelita Ingles, MD  loratadine (CLARITIN) 10 MG tablet Take 1 tablet (10 mg total) by mouth daily. 07/09/17   Angelita Ingles, MD  metoprolol tartrate (LOPRESSOR) 50 MG tablet Take 1 tablet (50 mg total) by mouth 2 (two) times daily. 07/09/17   Angelita Ingles, MD  penicillin v potassium (VEETID) 500 MG tablet Take 1 tablet (500 mg total) by mouth 4 (four) times daily for 7 days. 08/10/17 08/17/17  Eyvonne Mechanic, PA-C    Family History Family History  Problem Relation Age of Onset  . Hypertension Mother   . Cancer Mother   . Cancer Father   . Cancer Sister   . Ovarian cancer Unknown        family history  . Breast cancer Unknown        family history in 1st degree relatives.  . Thyroid disease Sister   . Colon cancer Neg Hx   . Stomach cancer Neg Hx     Social History Social History   Tobacco Use  . Smoking status: Never Smoker  . Smokeless tobacco: Never Used  Substance Use Topics  . Alcohol use: No    Alcohol/week: 0.0 oz  . Drug use: No     Allergies   Amlodipine; Coconut fatty acids; and Naproxen sodium   Review of Systems Review of Systems  All other systems reviewed and are negative.    Physical Exam Updated Vital Signs BP (!) 182/112 (BP Location: Right Arm)   Pulse 80   Temp 99.1 F (37.3 C) (Oral)   Resp 18   Ht 5\' 3"  (1.6 m)   Wt 117.9 kg (260 lb)   SpO2 98%   BMI 46.06 kg/m   Physical Exam  Constitutional: She is  oriented to person, place, and time. She appears well-developed and well-nourished.  HENT:  Head: Normocephalic and atraumatic.  Poor dentition with numerous dental caries, missing teeth, fractured teeth, gumline palpated without swelling or edema, tenderness along the left upper gumline-face is symmetrical with no swelling or edema, no redness  Eyes: Pupils are equal, round, and reactive to light. Conjunctivae are normal. Right eye exhibits no discharge. Left eye exhibits no discharge. No scleral icterus.  Neck: Normal range of motion. No JVD present. No tracheal deviation present.  Pulmonary/Chest: Effort normal. No stridor.  Neurological: She is alert and oriented to person, place, and time. Coordination normal.  Psychiatric: She has a normal mood and affect. Her behavior is  normal. Judgment and thought content normal.  Nursing note and vitals reviewed.    ED Treatments / Results  Labs (all labs ordered are listed, but only abnormal results are displayed) Labs Reviewed - No data to display  EKG None  Radiology No results found.  Procedures Procedures (including critical care time)  Medications Ordered in ED Medications  HYDROcodone-acetaminophen (NORCO/VICODIN) 5-325 MG per tablet 1 tablet (1 tablet Oral Given 08/10/17 1505)     Initial Impression / Assessment and Plan / ED Course  I have reviewed the triage vital signs and the nursing notes.  Pertinent labs & imaging results that were available during my care of the patient were reviewed by me and considered in my medical decision making (see chart for details).    62 year old female presents today with complaints of dental pain.  Although I do not see any objective signs of infection, patient reports swelling yesterday.  Patient will be treated with pain medication, antibiotics and close outpatient follow-up with dental resources.  She is given strict return precautions, she verbalized understanding and agreement to today's  plan.  Final Clinical Impressions(s) / ED Diagnoses   Final diagnoses:  Dental abscess  Hypertension, unspecified type    ED Discharge Orders        Ordered    penicillin v potassium (VEETID) 500 MG tablet  4 times daily     08/10/17 1506    HYDROcodone-acetaminophen (NORCO/VICODIN) 5-325 MG tablet  Every 6 hours PRN     08/10/17 1506       Eyvonne Mechanic, PA-C 08/10/17 1507    Benjiman Core, MD 08/10/17 2227

## 2017-08-10 NOTE — ED Triage Notes (Signed)
Patient reports history of HTn has not taken medication today.

## 2017-08-10 NOTE — Discharge Instructions (Addendum)
Please read attached information. If you experience any new or worsening signs or symptoms please return to the emergency room for evaluation. Please follow-up with your primary care provider or specialist as discussed. Please use medication prescribed only as directed and discontinue taking if you have any concerning signs or symptoms.   °

## 2017-09-29 ENCOUNTER — Ambulatory Visit (INDEPENDENT_AMBULATORY_CARE_PROVIDER_SITE_OTHER): Payer: Medicare HMO | Admitting: Internal Medicine

## 2017-09-29 ENCOUNTER — Encounter: Payer: Self-pay | Admitting: Internal Medicine

## 2017-09-29 ENCOUNTER — Other Ambulatory Visit: Payer: Self-pay

## 2017-09-29 VITALS — BP 163/81 | HR 74 | Temp 98.0°F | Ht 63.0 in | Wt 258.0 lb

## 2017-09-29 DIAGNOSIS — G4733 Obstructive sleep apnea (adult) (pediatric): Secondary | ICD-10-CM | POA: Diagnosis not present

## 2017-09-29 DIAGNOSIS — K0889 Other specified disorders of teeth and supporting structures: Secondary | ICD-10-CM

## 2017-09-29 DIAGNOSIS — E039 Hypothyroidism, unspecified: Secondary | ICD-10-CM | POA: Diagnosis not present

## 2017-09-29 DIAGNOSIS — R9089 Other abnormal findings on diagnostic imaging of central nervous system: Secondary | ICD-10-CM

## 2017-09-29 DIAGNOSIS — K029 Dental caries, unspecified: Secondary | ICD-10-CM

## 2017-09-29 DIAGNOSIS — Z6841 Body Mass Index (BMI) 40.0 and over, adult: Secondary | ICD-10-CM

## 2017-09-29 DIAGNOSIS — M25569 Pain in unspecified knee: Secondary | ICD-10-CM

## 2017-09-29 DIAGNOSIS — R51 Headache: Secondary | ICD-10-CM | POA: Diagnosis not present

## 2017-09-29 DIAGNOSIS — G8929 Other chronic pain: Secondary | ICD-10-CM | POA: Diagnosis not present

## 2017-09-29 DIAGNOSIS — I10 Essential (primary) hypertension: Secondary | ICD-10-CM

## 2017-09-29 MED ORDER — AMOXICILLIN-POT CLAVULANATE 875-125 MG PO TABS: 1.0000 | ORAL_TABLET | Freq: Two times a day (BID) | ORAL | 0 refills | Status: AC

## 2017-09-29 MED ORDER — HYDROCODONE-ACETAMINOPHEN 5-325 MG PO TABS: 1.0000 | ORAL_TABLET | Freq: Four times a day (QID) | ORAL | 0 refills | Status: DC | PRN

## 2017-09-29 NOTE — Assessment & Plan Note (Signed)
Assessment & Plan: Patient with history of CT brain 10/2011 which had features concerning for possible Chiari 1 malformation. Follow-up MRI to confirm this diagnosis has been attempted multiple times but patient unable to tolerate MRI despite anxiolytics. We discussed repeat imaging today as this could be related to her chronic headaches as well, but she declined further evaluation at this time.

## 2017-09-29 NOTE — Progress Notes (Addendum)
   CC: headache, dental pain  HPI:  [name] is a 62 y.o. F with medical history significant for morbid obesity, HTN, hypothyroidism, OSA, chronic knee pain, chronic headaches and poor dentition who presents today for evaluation of headache and dental pain.   For details regarding today's visit and the status of their chronic medical issues, please refer to the assessment and plan.  Past Medical History:  Diagnosis Date  . Back pain    chronic low back pain L4-5 discectomy:11/99. degenerative thoracic spondylotic changes 4/07  . Chest pain    neg adenosine myoview.  . Hypertension    no LVH EKG-7/05, nl M/C ratio 4/07  . Hypothyroidism    09/1998  . Lower extremity edema    echo EF 55-65% w/o evidence of Dias dysfx.,   . Menorrhagia   . Microcytic anemia    09/1998. Hb-10.5/MCV 76. needs ferritin to determine ACD vs IDA  . Morbid obesity (HCC)   . Obstructive sleep apnea    severe 7/05 RD 161 per hr. /CPAP 18cwp  . Polyarthralgia    (knee/back/ankle) w/dx of fibromyalgia? [facility] rheum, knee pain chronic 2/2 obesity, fibromyalgia and Sjogren's.  . Primary Sjogren's syndrome (HCC)    anti Ro+;ANA>1:1280 in homogen pattern, negative ds DNA/RF/anti Smith/RNP/C3-4 comp/la/jo1/Scleroderma/centromere, neg HIV/ACE/Hep B/C, nl CXR 2/05, schirmer salivary glandtest not done, symptom rx:eye drops/prednisone/plaquenil [facility] rhe[facility], Dr. Rushie NyhanBourne/o'Rourke 3/07. saw [name] but stopped 2/2 cost.  . Uterine bleeding    Review of Systems:   General: Admits to headache. Denies fevers, chills, weight loss, weakness, tingling HEENT: Admits to occasional blurred vision. Denies sore throat, dysphagia, odynophagia, drooling, neck swelling Cardiac: Denies CP, SOB Pulmonary: Admits to DOE. Denies cough Abd: Denies abdominal pain, diarrhea  Physical Exam: General: Alert, in no acute distress. Pleasant and conversant. Several daughters present.  HEENT: No icterus,  injection or ptosis. EOMI. No hoarseness or dysarthria. Poor dentition with numerous carries, fractured teeth or missing teeth. No erythema or purulence. No tender cervical lymphadenopathy Cardiac: RRR, no MGR Pulmonary: CTA BL with normal WOB on RA. Able to speak in complete sentences Neuro: Moves all extremities spontaneously. Strength intact BL UE.   Vitals:   09/29/17 1521 09/29/17 1610  BP: (!) 182/87 (!) 163/81  Pulse: 74   Temp: 98 F (36.7 C)   TempSrc: Oral   SpO2: 99%   Weight: 258 lb (117 kg)   Height: 5\' 3"  (1.6 m)    Body mass index is 45.7 kg/m.  Assessment & Plan:   See Encounters Tab for problem based charting.  Patient discussed with Dr. Criselda PeachesMullen

## 2017-09-29 NOTE — Patient Instructions (Signed)
It was nice meeting you today. I'm sorry you are having pain.   Today we talked about: #1) Dental Pain: I've prescribed an antibiotic called Augmentin which you should take twice daily for the next 7 days. This should help with your pain and help with any infection. It is extremely important that you call your insurance company to find out which dentist you can go to. I've also provided you a list of low-cost dental clinics to contact as well.  #2) I've provided you a short course of Norco. Because this is acute pain which can be managed (dentist), I am limited on how much I can give you. Further refills of this will need to be discussed with Dr. Heide SparkNarendra.  #3) Headache, High Blood Pressure: Please take your medications every single day. I understand that sometimes you feel OK when skipping, but these medications need to reach a steady level in your blood to work, and skipping them decreases these levels. You have refills at your pharmacy.    Please stop by the front desk and schedule an appointment with your PCP Dr. Heide SparkNarendra. His next available appointment is 8/27. It will go fast, so please schedule now.

## 2017-09-29 NOTE — Assessment & Plan Note (Signed)
HPI: Patient seen in ED 08/10/17 with dental pain and swelling. No local infection found but was treated empirically with Penicillin with improvement in symptoms. She was also given Norco 5-325 #10 with no refills which she thinks helped too. She reports the pain has worsened and requests refill of Norco. She does not follow with a dentist. Chart review shows long history of poor dentition and likely no dental follow-up (has been referred and encouraged).  Assessment:  She denies any fevers, chills, sore throat or odynophagia. Does report pain with eating and feels that her left cheek might be a bit more swollen than usual. Exam with extremely poor dentition with numerous dental carries, fractured teeth and missing teeth. Majority of her pain seems to be on the upper jaw and left side and while no erythema or obvious abscess was appreciated, her dentition is particularly poor in this area and believe she would benefit from a course of antibiotics. No facial swelling or lymphadenopathy was appreciated.     Plan: Will treat empirically with Augmentin BID x 7 days. Patient requested course of Norco which I think is reasonable given her acute presentation. She was advised that this is not a long-term Rx and further refills should be discussed with her PCP. She was advised to contact her insurance company to see if there are dentists in the area that are covered. She was also given several lists of low-cost dental clinics in the area if insurance does not cover dental care. Pt to contact clinic or present to ED should she develop fever, chills, difficulty breathing or swallowing.

## 2017-09-29 NOTE — Assessment & Plan Note (Signed)
HPI: Patient with long history of hypertension and intermittent non-compliance. [facility] of [facility] and while patient admitted to taking these medications, review of her medication bottles show no refills since 2019. She last filled a 2163-month supply of these at the end of May. Patient tells me she has been taking her medication nightly however on continued questioning she admits to "missing a few days." I mentioned that I don't see refills since May and she said she had enough at home from old prescriptions. She didn't realize she had a years supply at her pharmacy.  Assessment:  BP 182/87 with pulse 74. This improved on repeat to 163/81. She complains of headache today and slight blurred vision which started this morning & feels similar to her prior headaches. She admits to not taking her BP medications last night and missing doses on other days recently. She is unsure if this is related to her headache.. She also thinks her headache might be related to a dental infection (discussed in separate problem). Her neurologic exam is intact without focal deficit. Her vision is intact.      Plan: Explained to patient that her HA is related in some degree to her high blood pressure and that medications need to be taken daily to remain at a stable level in order to work. She voiced understanding but I think she's still high-risk for non-adherance and continued symptoms.  -Patient to resume her home BP regimen -Asked patient to dispose of old medications; either bring here or to her pharmacy as these may not be effective -She will need BMET at her next visit

## 2017-10-07 NOTE — Progress Notes (Signed)
Internal Medicine Clinic Attending  Case discussed with Dr. Molt at the time of the visit.  We reviewed the resident's history and exam and pertinent patient test results.  I agree with the assessment, diagnosis, and plan of care documented in the resident's note. 

## 2017-10-10 ENCOUNTER — Other Ambulatory Visit: Payer: Self-pay | Admitting: Internal Medicine

## 2017-10-10 DIAGNOSIS — J309 Allergic rhinitis, unspecified: Secondary | ICD-10-CM

## 2017-10-12 NOTE — Telephone Encounter (Signed)
Next appt scheduled 8/27 with PCP. 

## 2017-10-23 ENCOUNTER — Other Ambulatory Visit: Payer: Self-pay | Admitting: Internal Medicine

## 2017-10-23 DIAGNOSIS — I10 Essential (primary) hypertension: Secondary | ICD-10-CM

## 2017-10-25 MED ORDER — METOPROLOL TARTRATE 50 MG PO TABS: 50.0000 mg | ORAL_TABLET | Freq: Two times a day (BID) | ORAL | 3 refills | Status: DC

## 2017-10-25 NOTE — Telephone Encounter (Signed)
Next appt scheduled 8/27 with PCP. 

## 2017-10-26 ENCOUNTER — Other Ambulatory Visit: Payer: Self-pay | Admitting: Internal Medicine

## 2017-10-26 DIAGNOSIS — J309 Allergic rhinitis, unspecified: Secondary | ICD-10-CM

## 2017-11-02 ENCOUNTER — Telehealth: Payer: Self-pay | Admitting: *Deleted

## 2017-11-02 ENCOUNTER — Encounter: Payer: Self-pay | Admitting: Internal Medicine

## 2017-11-02 ENCOUNTER — Ambulatory Visit (INDEPENDENT_AMBULATORY_CARE_PROVIDER_SITE_OTHER): Payer: Medicare HMO | Admitting: Internal Medicine

## 2017-11-02 ENCOUNTER — Other Ambulatory Visit: Payer: Self-pay

## 2017-11-02 VITALS — BP 157/71 | HR 66 | Temp 98.5°F | Ht 63.0 in | Wt 258.3 lb

## 2017-11-02 DIAGNOSIS — M25561 Pain in right knee: Secondary | ICD-10-CM | POA: Diagnosis not present

## 2017-11-02 DIAGNOSIS — G8929 Other chronic pain: Secondary | ICD-10-CM | POA: Diagnosis not present

## 2017-11-02 DIAGNOSIS — G4733 Obstructive sleep apnea (adult) (pediatric): Secondary | ICD-10-CM | POA: Diagnosis not present

## 2017-11-02 DIAGNOSIS — Z79899 Other long term (current) drug therapy: Secondary | ICD-10-CM | POA: Diagnosis not present

## 2017-11-02 DIAGNOSIS — E039 Hypothyroidism, unspecified: Secondary | ICD-10-CM

## 2017-11-02 DIAGNOSIS — Z2882 Immunization not carried out because of caregiver refusal: Secondary | ICD-10-CM

## 2017-11-02 DIAGNOSIS — K029 Dental caries, unspecified: Secondary | ICD-10-CM

## 2017-11-02 DIAGNOSIS — D509 Iron deficiency anemia, unspecified: Secondary | ICD-10-CM

## 2017-11-02 DIAGNOSIS — K219 Gastro-esophageal reflux disease without esophagitis: Secondary | ICD-10-CM

## 2017-11-02 DIAGNOSIS — J069 Acute upper respiratory infection, unspecified: Secondary | ICD-10-CM | POA: Diagnosis not present

## 2017-11-02 DIAGNOSIS — Z9989 Dependence on other enabling machines and devices: Secondary | ICD-10-CM

## 2017-11-02 DIAGNOSIS — B9789 Other viral agents as the cause of diseases classified elsewhere: Secondary | ICD-10-CM

## 2017-11-02 DIAGNOSIS — Z79890 Hormone replacement therapy (postmenopausal): Secondary | ICD-10-CM

## 2017-11-02 DIAGNOSIS — M25569 Pain in unspecified knee: Secondary | ICD-10-CM | POA: Diagnosis not present

## 2017-11-02 DIAGNOSIS — M25562 Pain in left knee: Secondary | ICD-10-CM

## 2017-11-02 DIAGNOSIS — I10 Essential (primary) hypertension: Secondary | ICD-10-CM | POA: Diagnosis not present

## 2017-11-02 DIAGNOSIS — Z6841 Body Mass Index (BMI) 40.0 and over, adult: Secondary | ICD-10-CM

## 2017-11-02 DIAGNOSIS — Z0000 Encounter for general adult medical examination without abnormal findings: Secondary | ICD-10-CM

## 2017-11-02 MED ORDER — ACETAMINOPHEN-CODEINE #3 300-30 MG PO TABS: 1.0000 | ORAL_TABLET | ORAL | 0 refills | Status: DC | PRN

## 2017-11-02 MED ORDER — DICLOFENAC SODIUM 1 % TD GEL: 4.0000 g | Freq: Four times a day (QID) | TRANSDERMAL | 3 refills | Status: DC

## 2017-11-02 NOTE — Telephone Encounter (Signed)
Thank you :)

## 2017-11-02 NOTE — Telephone Encounter (Signed)
Patient called in states she received pain med but not gel. Spoke with Pharmacist at [facility] and voltaren gel requiring PA. Will forward to PA staff. Patient made aware that this could take 2-3 days. Patient appreciative. Kinnie FeilL. Greenley Martone, RN, BSN

## 2017-11-02 NOTE — Patient Instructions (Signed)
-  It was a pleasure seeing you today -You likely have a viral upper respiratory tract infection -Please take over-the-counter Robitussin or Mucinex as needed without the decongestant -Please call my office on Thursday or Friday if you are not feeling better and I will have you come in for follow-up appointment or prescribe antibiotics for you -We will check some blood work on you today -Please try to follow-up with your dentist for your dental pain

## 2017-11-03 ENCOUNTER — Telehealth: Payer: Self-pay | Admitting: *Deleted

## 2017-11-03 ENCOUNTER — Telehealth: Payer: Self-pay | Admitting: Internal Medicine

## 2017-11-03 DIAGNOSIS — J069 Acute upper respiratory infection, unspecified: Secondary | ICD-10-CM

## 2017-11-03 DIAGNOSIS — B9789 Other viral agents as the cause of diseases classified elsewhere: Secondary | ICD-10-CM

## 2017-11-03 HISTORY — DX: Acute upper respiratory infection, unspecified: J06.9

## 2017-11-03 LAB — CBC WITH DIFFERENTIAL/PLATELET
Basophils Absolute: 0 10*3/uL (ref 0.0–0.2)
Basos: 1 %
EOS (ABSOLUTE): 0.2 10*3/uL (ref 0.0–0.4)
Eos: 4 %
Hematocrit: 32.5 % — ABNORMAL LOW (ref 34.0–46.6)
Hemoglobin: 10.1 g/dL — ABNORMAL LOW (ref 11.1–15.9)
Immature Grans (Abs): 0 10*3/uL (ref 0.0–0.1)
Immature Granulocytes: 0 %
Lymphocytes Absolute: 1.6 10*3/uL (ref 0.7–3.1)
Lymphs: 40 %
MCH: 24.8 pg — ABNORMAL LOW (ref 26.6–33.0)
MCHC: 31.1 g/dL — ABNORMAL LOW (ref 31.5–35.7)
MCV: 80 fL (ref 79–97)
Monocytes Absolute: 0.4 10*3/uL (ref 0.1–0.9)
Monocytes: 9 %
Neutrophils Absolute: 1.9 10*3/uL (ref 1.4–7.0)
Neutrophils: 46 %
Platelets: 236 10*3/uL (ref 150–450)
RBC: 4.08 x10E6/uL (ref 3.77–5.28)
RDW: 15 % (ref 12.3–15.4)
WBC: 4.1 10*3/uL (ref 3.4–10.8)

## 2017-11-03 LAB — TSH: TSH: 2.68 u[IU]/mL (ref 0.450–4.500)

## 2017-11-03 LAB — BMP8+ANION GAP
Anion Gap: 15 mmol/L (ref 10.0–18.0)
BUN/Creatinine Ratio: 8 — ABNORMAL LOW (ref 12–28)
BUN: 9 mg/dL (ref 8–27)
CO2: 27 mmol/L (ref 20–29)
Calcium: 8.7 mg/dL (ref 8.7–10.3)
Chloride: 97 mmol/L (ref 96–106)
Creatinine, Ser: 1.15 mg/dL — ABNORMAL HIGH (ref 0.57–1.00)
GFR calc Af Amer: 58 mL/min/{1.73_m2} — ABNORMAL LOW
GFR calc non Af Amer: 50 mL/min/{1.73_m2} — ABNORMAL LOW
Glucose: 93 mg/dL (ref 65–99)
Potassium: 4.2 mmol/L (ref 3.5–5.2)
Sodium: 139 mmol/L (ref 134–144)

## 2017-11-03 NOTE — Assessment & Plan Note (Signed)
-  This problem is chronic and stable -She states that she is able to sleep through the night and her fatigue has improved -Patient states that she is compliant with her CPAP now -We will continue with CPAP at current settings

## 2017-11-03 NOTE — Assessment & Plan Note (Signed)
-  This problem is chronic and stable -Patient symptoms are well controlled currently and she is not taking any medications -We will continue to monitor closely

## 2017-11-03 NOTE — Assessment & Plan Note (Signed)
-  Patient complains of cough over the last 2 weeks associated with pleuritic chest pain and productive of whitish/yellowish phlegm with associated chills but no fevers and no sore throat -I suspect that she likely has a viral URI -We will hold off on antibiotics at current time -Patient instructed to try over-the-counter Robitussin or Mucinex without the decongestant for her cough -Patient is also on Tylenol/codeine chronically for her arthralgias and the codeine component should help her with her cough as well -Patient also instructed to try over-the-counter cough drops as needed -Patient will call me at the end of the week if she has persistent symptoms and will likely need an antibiotic prescription as this will be her third week with these symptoms and she is planning to go out of town this weekend

## 2017-11-03 NOTE — Progress Notes (Signed)
   Subjective:    Patient ID: Nancy [name], female    DOB: 05/04/1955, 62 y.o.   MRN: [id number]  HPI  I have seen and examined this patient.  Patient is here for routine follow-up of her hypertension and hypothyroidism.  Patient states that over the last couple weeks she has noted cough productive of whitish phlegm as well as some chills at home and pleuritic chest pain when she coughs.  She denies any other complaints at this time and states that she is compliant with all her medications.   Review of Systems  Constitutional: Positive for chills. Negative for fever.  HENT: Positive for congestion. Negative for rhinorrhea, sinus pain, sore throat and voice change.   Respiratory: Positive for cough. Negative for chest tightness and shortness of breath.   Cardiovascular: Positive for chest pain.       Pleuritic chest pain noted over her anterior chest wall which is sharp in nature and worse with coughing and taking deep breaths  Gastrointestinal: Negative.   Musculoskeletal: Positive for arthralgias. Negative for joint swelling and myalgias.  Skin: Negative.   Neurological: Negative.   Psychiatric/Behavioral: Negative.        Objective:   Physical Exam  Constitutional: She is oriented to person, place, and time. She appears well-developed and well-nourished.  HENT:  Head: Normocephalic and atraumatic.  Neck: Neck supple.  Cardiovascular: Regular rhythm. Exam reveals no S3 and no S4.    No systolic murmur is present. Pulmonary/Chest: Effort normal and breath sounds normal.  Abdominal: Soft. Bowel sounds are normal. She exhibits no distension. There is no tenderness.  Musculoskeletal: Normal range of motion.       Right lower leg: She exhibits no edema.       Left lower leg: She exhibits no edema.  Lymphadenopathy:    She has no cervical adenopathy.  Neurological: She is alert and oriented to person, place, and time.  Psychiatric: She has a normal mood and affect. Her behavior  is normal.          Assessment & Plan:  Please see problem based charting for assessment and plan:

## 2017-11-03 NOTE — Assessment & Plan Note (Signed)
-  This problem is chronic and stable -Patient is on Tylenol/codeine for this and states that this helps with the pain but does not relieve it completely -We will check a UDS today -I have also prescribed her Voltaren gel which should help with this pain.  She will need a prior authorization for this which we are working on

## 2017-11-03 NOTE — Assessment & Plan Note (Signed)
-  Patient refused flu shot.  Will attempt to discuss this again with her on her follow-up visit

## 2017-11-03 NOTE — Assessment & Plan Note (Signed)
-  This problem is chronic and stable -Patient denies any symptoms of hypo/hyperthyroidism -Will continue with levothyroxine 50 mcg daily -TSH checked today which was within normal limits

## 2017-11-03 NOTE — Assessment & Plan Note (Signed)
-  This problem is chronic and stable -Patient states that she does have some fatigue over the last couple weeks but this is likely secondary to her not feeling well from her viral URI -Repeat CBC done today showed a hemoglobin of 10.1 which is at her baseline -No further work-up at this time

## 2017-11-03 NOTE — Assessment & Plan Note (Signed)
-  This problem is chronic and stable -Patient's weight has remained steady at 258 pounds -Patient states that she has been unable to exercise secondary to not feeling well -We will follow-up with this on her next visit

## 2017-11-03 NOTE — Assessment & Plan Note (Signed)
-  This problem is chronic and stable -Patient has yet to follow-up with a dentist -On examination today there is no active inflammation in the gums surrounding her dental caries -No need for an antibiotic at this time -Patient encouraged to follow-up with the dentist as soon as possible

## 2017-11-03 NOTE — Assessment & Plan Note (Signed)
BP Readings from Last 3 Encounters:  11/02/17 (!) 157/71  09/29/17 (!) 163/81  08/10/17 (!) 149/73    Lab Results  Component Value Date   NA 139 11/02/2017   K 4.2 11/02/2017   CREATININE 1.15 (H) 11/02/2017    Assessment: Blood pressure control:  Fair Progress toward BP goal:   Improved Comments: Patient states that she is compliant with Lopressor 50 mg twice daily, lisinopril 40 mg, chlorthalidone 50 mg  Plan: Medications:  continue current medications Educational resources provided:   Self management tools provided:   Other plans: BMP checked today which showed a mildly elevated creatinine of 1.15.  Will recheck this on her follow-up appointment

## 2017-11-03 NOTE — Telephone Encounter (Signed)
I called the patient to discuss results of her blood work with her.  Patient was noted to have a mildly elevated creatinine up to 1.15.  I suspect this is likely secondary to dehydration in the setting of her viral URI.  Patient is on chlorthalidone and lisinopril for her blood pressure which may also be contributing to mildly increased creatinine.  Patient was encouraged to keep herself hydrated and we will recheck her creatinine on follow-up.  We will continue with her lisinopril and chlorthalidone for now.  Patient was also noted to be mildly anemic with a hemoglobin of 10.1 which is at her baseline.  We will continue to monitor this closely.  Her TSH is within normal limits.  No further work-up at this time.  Patient states that her cough is a little bit better today.  She will call the clinic on Friday and let me know how she is progressing.  Patient expresses understanding and is in agreement with plan.

## 2017-11-03 NOTE — Telephone Encounter (Addendum)
Information sent to CoverMyMeds for PA for Diclofenac Gel.  Awaiting decision within 24 hours.  Angelina OkGladys Herbin, RN 11/03/2017 8:53 AM. PA for Diclofenac Gel was approved 03/07/2017 thru 03/08/2018.  Angelina OkGladys Herbin, RN 11/03/2017 9:41 AM.

## 2017-11-06 LAB — TOXASSURE SELECT,+ANTIDEPR,UR

## 2017-12-22 ENCOUNTER — Other Ambulatory Visit: Payer: Self-pay | Admitting: Internal Medicine

## 2017-12-22 DIAGNOSIS — E039 Hypothyroidism, unspecified: Secondary | ICD-10-CM

## 2018-01-15 ENCOUNTER — Other Ambulatory Visit: Payer: Self-pay | Admitting: Internal Medicine

## 2018-01-15 DIAGNOSIS — M25561 Pain in right knee: Secondary | ICD-10-CM

## 2018-01-15 DIAGNOSIS — M25562 Pain in left knee: Principal | ICD-10-CM

## 2018-03-04 ENCOUNTER — Encounter (HOSPITAL_COMMUNITY): Payer: Self-pay | Admitting: Emergency Medicine

## 2018-03-04 ENCOUNTER — Emergency Department (HOSPITAL_COMMUNITY): Payer: Medicare HMO

## 2018-03-04 ENCOUNTER — Emergency Department (HOSPITAL_COMMUNITY)
Admission: EM | Admit: 2018-03-04 | Discharge: 2018-03-04 | Disposition: A | Discharge status: Home / Self Care | Payer: Medicare HMO | Attending: Emergency Medicine | Admitting: Emergency Medicine

## 2018-03-04 ENCOUNTER — Other Ambulatory Visit: Payer: Self-pay

## 2018-03-04 DIAGNOSIS — J189 Pneumonia, unspecified organism: Secondary | ICD-10-CM | POA: Diagnosis not present

## 2018-03-04 DIAGNOSIS — I10 Essential (primary) hypertension: Secondary | ICD-10-CM | POA: Diagnosis not present

## 2018-03-04 DIAGNOSIS — E039 Hypothyroidism, unspecified: Secondary | ICD-10-CM | POA: Insufficient documentation

## 2018-03-04 DIAGNOSIS — R05 Cough: Secondary | ICD-10-CM | POA: Diagnosis not present

## 2018-03-04 DIAGNOSIS — R059 Cough, unspecified: Secondary | ICD-10-CM

## 2018-03-04 DIAGNOSIS — R079 Chest pain, unspecified: Secondary | ICD-10-CM | POA: Diagnosis not present

## 2018-03-04 DIAGNOSIS — R0602 Shortness of breath: Secondary | ICD-10-CM | POA: Diagnosis not present

## 2018-03-04 DIAGNOSIS — Z79899 Other long term (current) drug therapy: Secondary | ICD-10-CM | POA: Diagnosis not present

## 2018-03-04 DIAGNOSIS — J168 Pneumonia due to other specified infectious organisms: Secondary | ICD-10-CM | POA: Diagnosis not present

## 2018-03-04 LAB — CBC WITH DIFFERENTIAL/PLATELET
Abs Immature Granulocytes: 0.01 10*3/uL (ref 0.00–0.07)
Basophils Absolute: 0 10*3/uL (ref 0.0–0.1)
Basophils Relative: 1 %
Eosinophils Absolute: 0.2 10*3/uL (ref 0.0–0.5)
Eosinophils Relative: 4 %
HCT: 31.9 % — ABNORMAL LOW (ref 36.0–46.0)
Hemoglobin: 9.8 g/dL — ABNORMAL LOW (ref 12.0–15.0)
Immature Granulocytes: 0 %
Lymphocytes Relative: 39 %
Lymphs Abs: 1.7 10*3/uL (ref 0.7–4.0)
MCH: 23.6 pg — ABNORMAL LOW (ref 26.0–34.0)
MCHC: 30.7 g/dL (ref 30.0–36.0)
MCV: 76.7 fL — ABNORMAL LOW (ref 80.0–100.0)
Monocytes Absolute: 0.4 10*3/uL (ref 0.1–1.0)
Monocytes Relative: 9 %
Neutro Abs: 2.1 10*3/uL (ref 1.7–7.7)
Neutrophils Relative %: 47 %
Platelets: 309 10*3/uL (ref 150–400)
RBC: 4.16 MIL/uL (ref 3.87–5.11)
RDW: 14 % (ref 11.5–15.5)
WBC: 4.4 10*3/uL (ref 4.0–10.5)
nRBC: 0 % (ref 0.0–0.2)

## 2018-03-04 LAB — COMPREHENSIVE METABOLIC PANEL WITH GFR
ALT: 9 U/L (ref 0–44)
AST: 16 U/L (ref 15–41)
Albumin: 3.5 g/dL (ref 3.5–5.0)
Alkaline Phosphatase: 46 U/L (ref 38–126)
Anion gap: 10 (ref 5–15)
BUN: 12 mg/dL (ref 8–23)
CO2: 27 mmol/L (ref 22–32)
Calcium: 8.9 mg/dL (ref 8.9–10.3)
Chloride: 101 mmol/L (ref 98–111)
Creatinine, Ser: 0.98 mg/dL (ref 0.44–1.00)
GFR calc Af Amer: >60 mL/min
GFR calc non Af Amer: >60 mL/min
Glucose, Bld: 99 mg/dL (ref 70–99)
Potassium: 3.7 mmol/L (ref 3.5–5.1)
Sodium: 138 mmol/L (ref 135–145)
Total Bilirubin: 0.9 mg/dL (ref 0.3–1.2)
Total Protein: 7.6 g/dL (ref 6.5–8.1)

## 2018-03-04 LAB — D-DIMER, QUANTITATIVE: D-Dimer, Quant: 1.26 ug{FEU}/mL — ABNORMAL HIGH (ref 0.00–0.50)

## 2018-03-04 LAB — I-STAT TROPONIN, ED: Troponin i, poc: 0 ng/mL (ref 0.00–0.08)

## 2018-03-04 LAB — TSH: TSH: 2.327 u[IU]/mL (ref 0.350–4.500)

## 2018-03-04 LAB — TROPONIN I: Troponin I: <0.03 ng/mL

## 2018-03-04 MED ORDER — IOPAMIDOL (ISOVUE-370) INJECTION 76%
INTRAVENOUS | Status: AC
  Administered 2018-03-04: 100 mL
  Filled 2018-03-04: qty 100

## 2018-03-04 MED ORDER — DOXYCYCLINE HYCLATE 100 MG PO CAPS: 100.0000 mg | ORAL_CAPSULE | Freq: Two times a day (BID) | ORAL | 0 refills | Status: AC

## 2018-03-04 NOTE — ED Triage Notes (Signed)
Pt with c/o shortness of breath x2 weeks, she reports a productive cough and rib pain. Denies nausea or cardiac hx reports a few episodes of diarrhea.

## 2018-03-04 NOTE — ED Provider Notes (Signed)
[facility] EMERGENCY DEPARTMENT Provider Note   CSN: [id number] Arrival date & time: 03/04/18  1319     History   Chief Complaint Chief Complaint  Patient presents with  . Shortness of Breath  . Flank Pain    HPI Nancy [name] is a 62 y.o. female.  The history is provided by the patient. No language interpreter was used.     Patient is a 62 year old female with a past medical history of Sjogren's syndrome, anemia, hypertension, hypothyroidism, obesity, and OSA who presents for evaluation of 2 weeks of nonproductive cough and right-sided chest pain.  Patient also notes significant fatigue over the last 2 weeks.  She denies any fevers or chills, vomiting, dysuria but does note some loose stools.  She denies any blood in her stool or blood in her urine.  Denies any extremity pain, rash, recent travel out of [place], sick contacts, similar prior episodes, or other acute complaints.  Patient is unable to specify alleviating or aggravating factors including a positional or exertional components.  However her daughter notes that she thinks the patient has been congested and that this has made her chronic shortness of breath worse.  Past Medical History:  Diagnosis Date  . Back pain    chronic low back pain L4-5 discectomy:11/99. degenerative thoracic spondylotic changes 4/07  . Chest pain    neg adenosine myoview.  . Hypertension    no LVH EKG-7/05, nl M/C ratio 4/07  . Hypothyroidism    09/1998  . Lower extremity edema    echo EF 55-65% w/o evidence of Dias dysfx.,   . Menorrhagia   . Microcytic anemia    09/1998. Hb-10.5/MCV 76. needs ferritin to determine ACD vs IDA  . Morbid obesity (HCC)   . Obstructive sleep apnea    severe 7/05 RD 161 per hr. /CPAP 18cwp  . Polyarthralgia    (knee/back/ankle) w/dx of fibromyalgia? given by [facility] rheum, knee pain chronic 2/2 obesity, fibromyalgia and Sjogren's.  . Primary Sjogren's syndrome (HCC)    anti  Ro+;ANA>1:1280 in homogen pattern, negative ds DNA/RF/anti Smith/RNP/C3-4 comp/la/jo1/Scleroderma/centromere, neg HIV/ACE/Hep B/C, nl CXR 2/05, schirmer salivary glandtest not done, symptom rx:eye drops/prednisone/plaquenil referral to [facility] rheum, [name] 3/07. saw [name] but stopped 2/2 cost.  . Uterine bleeding     Patient Active Problem List   Diagnosis Date Noted  . Viral URI with cough 11/03/2017  . Wheelchair dependence 12/17/2016  . Polyarthralgia 12/02/2016  . Healthcare maintenance 12/18/2014  . Pain due to dental caries 10/11/2014  . GERD (gastroesophageal reflux disease) 04/13/2014  . Allergic rhinitis 01/24/2014  . Severe obesity (BMI >= 40) (HCC) 09/14/2013  . Abnormal CT of brain 11/19/2011  . MACULAR DEGENERATION, BILATERAL 05/22/2008  . Essential hypertension 03/17/2006  . SICCA SYNDROME 03/17/2006  . KNEE PAIN, CHRONIC 03/17/2006  . Acute exacerbation of chronic low back pain 03/17/2006  . Obstructive sleep apnea 09/07/2003  . Hypothyroidism 09/07/1998  . ANEMIA, IRON DEFICIENCY NOS 09/07/1998    Past Surgical History:  Procedure Laterality Date  . EXAMINATION UNDER ANESTHESIA  11/23/2011   Procedure: EXAM UNDER ANESTHESIA;  Surgeon: Tereso Newcomer, MD;  Location: [facility];  Service: Gynecology;  Laterality: N/A;  . HYSTEROSCOPY W/D&C  11/23/2011   Procedure: DILATATION AND CURETTAGE /HYSTEROSCOPY;  Surgeon: Tereso Newcomer, MD;  Location: [facility];  Service: Gynecology;  Laterality: N/A;  . LUMBAR FUSION  1999  . SHOULDER SURGERY  unk  . TONSILLECTOMY    . TUBAL  LIGATION  1977  . WISDOM TOOTH EXTRACTION       OB History   No obstetric history on file.      Home Medications    Prior to Admission medications   Medication Sig Start Date End Date Taking? Authorizing Provider  acetaminophen-codeine (TYLENOL #3) 300-30 MG tablet Take 1 tablet by mouth every 4 (four) hours as needed for moderate pain. diag code M25.569 01/18/18  Yes  Earl Lagos, MD  albuterol (PROVENTIL HFA;VENTOLIN HFA) 108 (90 Base) MCG/ACT inhaler Inhale 1-2 puffs every 6 (six) hours as needed into the lungs for wheezing or shortness of breath. 01/22/17  Yes Harris, Abigail, PA-C  chlorthalidone (HYGROTON) 50 MG tablet Take 1 tablet (50 mg total) by mouth daily. 07/09/17  Yes Angelita Ingles, MD  diclofenac sodium (VOLTAREN) 1 % GEL Apply 4 g topically 4 (four) times daily. Patient taking differently: Apply 4 g topically 2 (two) times daily as needed (back pain).  11/02/17  Yes Earl Lagos, MD  fluticasone (FLONASE) 50 MCG/ACT nasal spray SPRAY 2 SPRAYS INTO EACH NOSTRIL EVERY DAY Patient taking differently: Place 2 sprays into both nostrils daily as needed for allergies.  10/26/17  Yes Earl Lagos, MD  guaiFENesin (MUCINEX) 600 MG 12 hr tablet Take 600 mg by mouth 2 (two) times daily as needed for cough or to loosen phlegm.   Yes [provider]  levothyroxine (SYNTHROID, LEVOTHROID) 50 MCG tablet TAKE 1 TABLET BY MOUTH EVERY DAY Patient taking differently: Take 50 mcg by mouth at bedtime.  12/22/17  Yes Earl Lagos, MD  lisinopril (PRINIVIL,ZESTRIL) 40 MG tablet Take 1 tablet (40 mg total) by mouth daily. 07/09/17 07/09/18 Yes Angelita Ingles, MD  loratadine (CLARITIN) 10 MG tablet Take 1 tablet (10 mg total) by mouth daily. Patient taking differently: Take 10 mg by mouth daily as needed for allergies.  07/09/17  Yes Angelita Ingles, MD  metoprolol tartrate (LOPRESSOR) 50 MG tablet Take 1 tablet (50 mg total) by mouth 2 (two) times daily. Patient taking differently: Take 50 mg by mouth at bedtime.  10/25/17  Yes Earl Lagos, MD  doxycycline (VIBRAMYCIN) 100 MG capsule Take 1 capsule (100 mg total) by mouth 2 (two) times daily for 7 days. 03/04/18 03/11/18  Antoine Primas, MD    Family History Family History  Problem Relation Age of Onset  . Hypertension Mother   . Cancer Mother   . Cancer Father   . Cancer Sister     . Ovarian cancer Other        family history  . Breast cancer Other        family history in 1st degree relatives.  . Thyroid disease Sister   . Colon cancer Neg Hx   . Stomach cancer Neg Hx     Social History Social History   Tobacco Use  . Smoking status: Never Smoker  . Smokeless tobacco: Never Used  Substance Use Topics  . Alcohol use: No    Alcohol/week: 0.0 standard drinks  . Drug use: No     Allergies   Amlodipine; Coconut fatty acids; and Naproxen sodium   Review of Systems Review of Systems  Constitutional: Positive for fatigue. Negative for chills and fever.  HENT: Negative for ear pain and sore throat.   Eyes: Negative for pain and visual disturbance.  Respiratory: Positive for cough. Negative for shortness of breath.   Cardiovascular: Positive for chest pain. Negative for palpitations.  Gastrointestinal: Negative for abdominal pain and vomiting.  Genitourinary: Negative for dysuria and hematuria.  Musculoskeletal: Positive for myalgias ( R chest). Negative for arthralgias and back pain.  Skin: Negative for color change and rash.  Neurological: Negative for seizures and syncope.  All other systems reviewed and are negative.    Physical Exam Updated Vital Signs BP (!) 171/80 (BP Location: Right Arm)   Pulse 74   Temp 98.1 F (36.7 C) (Oral)   Resp 18   SpO2 98%   Physical Exam Vitals signs and nursing note reviewed.  Constitutional:      General: She is not in acute distress.    Appearance: She is well-developed.  HENT:     Head: Normocephalic and atraumatic.     Mouth/Throat:     Mouth: Mucous membranes are moist.  Eyes:     Conjunctiva/sclera: Conjunctivae normal.  Neck:     Musculoskeletal: Neck supple.  Cardiovascular:     Rate and Rhythm: Normal rate and regular rhythm.     Heart sounds: No murmur.  Pulmonary:     Effort: Pulmonary effort is normal. No respiratory distress.     Breath sounds: Normal breath sounds.  Abdominal:      Palpations: Abdomen is soft.     Tenderness: There is no abdominal tenderness.  Skin:    General: Skin is warm and dry.     Capillary Refill: Capillary refill takes less than 2 seconds.  Neurological:     General: No focal deficit present.     Mental Status: She is alert.      ED Treatments / Results  Labs (all labs ordered are listed, but only abnormal results are displayed) Labs Reviewed  CBC WITH DIFFERENTIAL/PLATELET - Abnormal; Notable for the following components:      Result Value   Hemoglobin 9.8 (*)    HCT 31.9 (*)    MCV 76.7 (*)    MCH 23.6 (*)    All other components within normal limits  D-DIMER, QUANTITATIVE (NOT AT [facility]) - Abnormal; Notable for the following components:   D-Dimer, Quant 1.26 (*)    All other components within normal limits  COMPREHENSIVE METABOLIC PANEL  TSH  TROPONIN I  I-STAT TROPONIN, ED  I-STAT TROPONIN, ED    EKG EKG Interpretation  Date/Time:  Friday March 04 2018 13:30:32 EST Ventricular Rate:  71 PR Interval:  146 QRS Duration: 68 QT Interval:  424 QTC Calculation: 460 R Axis:   24 Text Interpretation:  Normal sinus rhythm Nonspecific ST and T wave abnormality Abnormal ECG When compared to prior,  new t wave inversions in leads 3,AVF, V1, V3. Similar to ECG back in 2009.  No STEMI Confirmed by Theda Belfastegeler, Chris ([id number]) on 03/04/2018 5:14:17 PM   Radiology Dg Chest 2 View  Result Date: 03/04/2018 CLINICAL DATA:  Cough and shortness of breath for 2 weeks. EXAM: CHEST - 2 VIEW COMPARISON:  01/22/2017 and prior chest radiographs FINDINGS: The cardiomediastinal silhouette is unremarkable. Mild elevation of the RIGHT hemidiaphragm and mild chronic peribronchial thickening again noted. There is no evidence of focal airspace disease, pulmonary edema, suspicious pulmonary nodule/mass, pleural effusion, or pneumothorax. No acute bony abnormalities are identified. IMPRESSION: No active cardiopulmonary disease. Electronically Signed   By:  Harmon PierJeffrey  Hu M.D.   On: 03/04/2018 14:12   Ct Angio Chest Pe W And/or Wo Contrast  Result Date: 03/04/2018 CLINICAL DATA:  Subacute onset of cough and shortness of breath. EXAM: CT ANGIOGRAPHY CHEST WITH CONTRAST TECHNIQUE: Multidetector CT imaging of the chest was performed  using the standard protocol during bolus administration of intravenous contrast. Multiplanar CT image reconstructions and MIPs were obtained to evaluate the vascular anatomy. CONTRAST:  100mL ISOVUE-370 IOPAMIDOL (ISOVUE-370) INJECTION 76% COMPARISON:  Chest radiograph performed earlier today at 2:09 p.m. FINDINGS: Cardiovascular:  There is no evidence of pulmonary embolus. The heart is normal in size. Mild calcification is noted along the aortic arch and proximal left subclavian artery. The great vessels are otherwise unremarkable. Mediastinum/Nodes: The mediastinum is unremarkable in appearance. No mediastinal lymphadenopathy is seen. No pericardial effusion is identified. The thyroid gland is somewhat prominent, without a definite focal mass. No axillary lymphadenopathy is seen. Lungs/Pleura: There is a mosaic pattern of parenchymal attenuation, which is nonspecific but could reflect atelectasis, mild infection or interstitial edema. No pleural effusion or pneumothorax is seen. No masses are identified. Upper Abdomen: The visualized portions of the liver and spleen are unremarkable. The visualized portions of the gallbladder, pancreas, adrenal glands and kidneys are within normal limits. Musculoskeletal: No acute osseous abnormalities are identified. The visualized musculature is unremarkable in appearance. Review of the MIP images confirms the above findings. IMPRESSION: 1. No evidence of pulmonary embolus. 2. Mosaic pattern of parenchymal attenuation, which is nonspecific but could reflect atelectasis, mild infection or interstitial edema. Electronically Signed   By: Roanna RaiderJeffery  Chang M.D.   On: 03/04/2018 21:11     Procedures Procedures (including critical care time)  Medications Ordered in ED Medications  iopamidol (ISOVUE-370) 76 % injection (100 mLs  Contrast Given 03/04/18 2051)     Initial Impression / Assessment and Plan / ED Course  I have reviewed the triage vital signs and the nursing notes.  Pertinent labs & imaging results that were available during my care of the patient were reviewed by me and considered in my medical decision making (see chart for details).    Patient is a 62 year old female who presents above-stated history exam.  On presentation patient is afebrile stable vital signs.  Exam as above. I have a low suspicion for ACS given EKG does not show any acute ischemic findings and initial troponin is undetectable.  In addition another troponin of 5 hours was drawn was found to be undetectable.  While patient's hemoglobin is noted to be 9.8 this appears to be close to her baseline based on several levels drawn at 4 months ago and 1 year ago.  In addition her CBC shows no leukocytosis.  CMP is WNL.  TSH WNL.  Patient's d-dimer was noted to be elevated and a CTA chest was obtained to assess for PE.  While no PE was obtained findings were concerning for pneumonia.  History exam is not consistent with sepsis, pericarditis, myocarditis, aortic dissection, or other imminently life-threatening intrathoracic etiology.  Patient discharged in stable condition.  Strict return precautions advised and discussed.  Patient given prescription for p.o. antibiotics to be taken for 1 week.  Instructed to follow-up with her PCP in 2 to 3 days.  Patient voiced understanding and agreement with this plan.  Final Clinical Impressions(s) / ED Diagnoses   Final diagnoses:  Cough  Pneumonia due to infectious organism, unspecified laterality, unspecified part of lung    ED Discharge Orders         Ordered    doxycycline (VIBRAMYCIN) 100 MG capsule  2 times daily     03/04/18 2137            Antoine PrimasSmith, Zachary, MD 03/05/18 0104    Tegeler, Canary Brimhristopher J, MD 03/05/18 [phone]

## 2018-03-22 ENCOUNTER — Other Ambulatory Visit: Payer: Self-pay

## 2018-03-22 ENCOUNTER — Ambulatory Visit (INDEPENDENT_AMBULATORY_CARE_PROVIDER_SITE_OTHER): Payer: Medicare HMO | Admitting: Internal Medicine

## 2018-03-22 ENCOUNTER — Ambulatory Visit (HOSPITAL_COMMUNITY)
Admission: RE | Admit: 2018-03-22 | Discharge: 2018-03-22 | Disposition: A | Discharge status: Home / Self Care | Payer: Medicare HMO | Source: Ambulatory Visit | Attending: Internal Medicine | Admitting: Internal Medicine

## 2018-03-22 ENCOUNTER — Encounter: Payer: Self-pay | Admitting: Internal Medicine

## 2018-03-22 VITALS — BP 139/78 | HR 78 | Temp 98.4°F | Ht 63.0 in | Wt 246.4 lb

## 2018-03-22 DIAGNOSIS — M25561 Pain in right knee: Secondary | ICD-10-CM

## 2018-03-22 DIAGNOSIS — J309 Allergic rhinitis, unspecified: Secondary | ICD-10-CM

## 2018-03-22 DIAGNOSIS — J069 Acute upper respiratory infection, unspecified: Secondary | ICD-10-CM | POA: Insufficient documentation

## 2018-03-22 DIAGNOSIS — B9789 Other viral agents as the cause of diseases classified elsewhere: Secondary | ICD-10-CM | POA: Diagnosis present

## 2018-03-22 DIAGNOSIS — M25569 Pain in unspecified knee: Secondary | ICD-10-CM | POA: Diagnosis not present

## 2018-03-22 DIAGNOSIS — R05 Cough: Secondary | ICD-10-CM | POA: Diagnosis not present

## 2018-03-22 DIAGNOSIS — I10 Essential (primary) hypertension: Secondary | ICD-10-CM

## 2018-03-22 DIAGNOSIS — G4733 Obstructive sleep apnea (adult) (pediatric): Secondary | ICD-10-CM | POA: Diagnosis not present

## 2018-03-22 DIAGNOSIS — E039 Hypothyroidism, unspecified: Secondary | ICD-10-CM

## 2018-03-22 DIAGNOSIS — Z0000 Encounter for general adult medical examination without abnormal findings: Secondary | ICD-10-CM

## 2018-03-22 DIAGNOSIS — Z79890 Hormone replacement therapy (postmenopausal): Secondary | ICD-10-CM

## 2018-03-22 DIAGNOSIS — Z6841 Body Mass Index (BMI) 40.0 and over, adult: Secondary | ICD-10-CM | POA: Diagnosis not present

## 2018-03-22 DIAGNOSIS — M25562 Pain in left knee: Secondary | ICD-10-CM

## 2018-03-22 DIAGNOSIS — Z9119 Patient's noncompliance with other medical treatment and regimen: Secondary | ICD-10-CM

## 2018-03-22 DIAGNOSIS — G8929 Other chronic pain: Secondary | ICD-10-CM | POA: Diagnosis not present

## 2018-03-22 DIAGNOSIS — R0781 Pleurodynia: Secondary | ICD-10-CM | POA: Diagnosis not present

## 2018-03-22 DIAGNOSIS — Z9989 Dependence on other enabling machines and devices: Secondary | ICD-10-CM

## 2018-03-22 DIAGNOSIS — E669 Obesity, unspecified: Secondary | ICD-10-CM

## 2018-03-22 DIAGNOSIS — Z79899 Other long term (current) drug therapy: Secondary | ICD-10-CM

## 2018-03-22 MED ORDER — ACETAMINOPHEN-CODEINE #3 300-30 MG PO TABS: 1.0000 | ORAL_TABLET | ORAL | 0 refills | Status: DC | PRN

## 2018-03-22 MED ORDER — DICLOFENAC SODIUM 1 % TD GEL: 4.0000 g | Freq: Two times a day (BID) | TRANSDERMAL | 2 refills | Status: DC | PRN

## 2018-03-22 MED ORDER — LORATADINE 10 MG PO TABS: 10.0000 mg | ORAL_TABLET | Freq: Every day | ORAL | 1 refills | Status: DC | PRN

## 2018-03-22 NOTE — Assessment & Plan Note (Signed)
-  Patient's weight is down approximately 12 pounds since August -We will continue to monitor her weight closely -I will follow-up with this at her next visit and give diet and exercise recommendations for her as well once she is recovered from her ongoing URI

## 2018-03-22 NOTE — Assessment & Plan Note (Signed)
BP Readings from Last 3 Encounters:  03/22/18 139/78  03/04/18 (!) 171/80  11/02/17 (!) 157/71    Lab Results  Component Value Date   NA 138 03/04/2018   K 3.7 03/04/2018   CREATININE 0.98 03/04/2018    Assessment: Blood pressure control:  Well-controlled Progress toward BP goal:   At goal Comments: Patient is compliant with Lopressor 50 mg twice daily, lisinopril 40 mg daily and chlorthalidone 50 mg daily  Plan: Medications:  continue current medications Educational resources provided:   Self management tools provided:   Other plans: We will check BMP on follow-up visit.  Last creatinine done in December was within normal limits with a normal potassium.

## 2018-03-22 NOTE — Patient Instructions (Addendum)
-  It was a pleasure seeing you today -I have refilled your Voltaren gel as well as Tylenol 3 -Please try and use her CPAP at night -We will check a chest x-ray today -We will check some blood work on your follow-up visit -Please call me with any questions or concerns -Have a great new year!

## 2018-03-22 NOTE — Assessment & Plan Note (Signed)
-  This problem is chronic and stable -I suspect that her runny nose may also have a component of allergic rhinitis -I have refilled her Claritin for her -We will follow-up on her next visit

## 2018-03-22 NOTE — Assessment & Plan Note (Addendum)
-  Patient declined flu shot today -Patient will need a Pap smear on her follow-up visit as well as a pneumonia vaccine (PCV13)

## 2018-03-22 NOTE — Assessment & Plan Note (Signed)
-  This problem is chronic and stable -Patient states that she has not been compliant with her CPAP the last couple of days -She states that she has been waking up with a headache over the last few days as well -I suspect that this is secondary to her OSA and noncompliance with her CPAP as well as poor sleep secondary to her pleuritic chest pain -Patient encouraged to use her CPAP at night and to use Voltaren gel for her pleuritic chest pain

## 2018-03-22 NOTE — Assessment & Plan Note (Signed)
-  Patient was recently seen in the ED at the end of December for worsening cough and shortness of breath.  She was noted to have an elevated d-dimer and had a CT angio chest done at that time which showed possible interstitial pneumonia versus atelectasis. -She completed a 7-day course of doxycycline for this -She states that her cough is much improved but still has some mild nonproductive cough as well as a runny nose. -I suspect that her symptoms are likely secondary to a viral URI as well as a possible component of allergic rhinitis. -I have refilled her Claritin today -Patient does complain of right-sided pleuritic chest pain as well.  I suspect this is likely costochondritis from her persistent cough.  Patient will apply Voltaren gel to this area for pain relief. -We will follow-up repeat chest x-ray -No further work-up at this time

## 2018-03-22 NOTE — Assessment & Plan Note (Signed)
-  This problem is chronic and stable -TSH done in December was within normal limits -Continue with current dose of levothyroxine -No further work-up at this time

## 2018-03-22 NOTE — Progress Notes (Signed)
   Subjective:    Patient ID: Nancy [name], female    DOB: 12-17-1955, 63 y.o.   MRN: [id number]  HPI  I have seen and examined this patient.  Patient is here for routine follow-up of her hypertension and hypothyroidism.  Patient was recently seen in the ED (approximately 2 weeks ago) for worsening cough and shortness of breath and was found to have possible pneumonia on CT.  She was treated with a 7-day course of doxycycline as an outpatient.  Patient still complains of some mild cough.  She also complains of some right-sided chest pain which worsened with deep inspiration.  She also states she has been going to the bathroom more frequently since taking her antibiotics and being sick but states that her stool is only occasionally watery but usually semi-formed.  She states that she is compliant with her medications and denies other complaints at this time.   Review of Systems  Constitutional: Negative.   HENT: Negative.   Respiratory: Positive for cough. Negative for shortness of breath and wheezing.   Cardiovascular: Positive for chest pain.       Mainly right-sided and pleuritic in nature but also some mild substernal pleuritic pain  Gastrointestinal: Positive for diarrhea. Negative for blood in stool, nausea and vomiting.  Musculoskeletal: Positive for arthralgias. Negative for joint swelling and myalgias.  Neurological: Negative.   Psychiatric/Behavioral: Negative.        Objective:   Physical Exam Constitutional:      Appearance: Normal appearance.  HENT:     Head: Normocephalic and atraumatic.     Mouth/Throat:     Mouth: Mucous membranes are moist.     Pharynx: No posterior oropharyngeal erythema.  Neck:     Musculoskeletal: Neck supple.  Cardiovascular:     Rate and Rhythm: Normal rate and regular rhythm.     Heart sounds: Normal heart sounds.  Pulmonary:     Breath sounds: Normal breath sounds. No wheezing.  Abdominal:     General: Bowel sounds are normal. There is  no distension.     Palpations: Abdomen is soft.     Tenderness: There is no abdominal tenderness.  Musculoskeletal:        General: No swelling or tenderness.  Lymphadenopathy:     Cervical: No cervical adenopathy.  Neurological:     Mental Status: She is alert and oriented to person, place, and time. Mental status is at baseline.  Psychiatric:        Mood and Affect: Mood normal.        Behavior: Behavior normal.           Assessment & Plan:  Please see problem based charting for assessment and plan:

## 2018-03-22 NOTE — Assessment & Plan Note (Signed)
-  This problem is chronic and stable -Patient states that symptoms are better controlled with Voltaren gel as well as Tylenol 3 as needed -I will refill these medications for her today -I also encouraged her to use Voltaren gel for her right-sided pleuritic chest pain as well as occasional pain in the small joints of her fingers -No further work-up at this time

## 2018-03-23 ENCOUNTER — Telehealth: Payer: Self-pay | Admitting: Internal Medicine

## 2018-03-23 NOTE — Telephone Encounter (Signed)
I called the patient to discuss the results of her chest x-ray with her.  Chest x-ray shows no acute pathology.  I suspect that the patient's symptoms are related to a viral URI.  No further work-up at this time.  Patient expresses understanding and is in agreement with plan.

## 2018-04-08 ENCOUNTER — Other Ambulatory Visit: Payer: Self-pay | Admitting: Internal Medicine

## 2018-04-08 DIAGNOSIS — I10 Essential (primary) hypertension: Secondary | ICD-10-CM

## 2018-04-20 ENCOUNTER — Telehealth: Payer: Self-pay | Admitting: *Deleted

## 2018-04-20 NOTE — Telephone Encounter (Addendum)
Information was sent through CoverMyMeds for PA for Diclofenac Gel1%.  Awaiting decision [id number].  Angelina OkGladys Jashawna Reever, RN 04/20/2018 9:29 AM. PA for Diclofenac Gel 1% approved 03/07/2018 through 03/09/2019. [facility] [address] was notified.  Angelina OkGladys Jobie Popp, RN 04/20/2018 10:34 AM.

## 2018-04-22 DIAGNOSIS — G4733 Obstructive sleep apnea (adult) (pediatric): Secondary | ICD-10-CM | POA: Diagnosis not present

## 2018-04-22 DIAGNOSIS — M2550 Pain in unspecified joint: Secondary | ICD-10-CM | POA: Diagnosis not present

## 2018-05-03 DIAGNOSIS — Z1231 Encounter for screening mammogram for malignant neoplasm of breast: Secondary | ICD-10-CM | POA: Diagnosis not present

## 2018-05-03 DIAGNOSIS — Z0000 Encounter for general adult medical examination without abnormal findings: Secondary | ICD-10-CM | POA: Diagnosis not present

## 2018-05-03 DIAGNOSIS — E039 Hypothyroidism, unspecified: Secondary | ICD-10-CM | POA: Diagnosis not present

## 2018-05-03 DIAGNOSIS — I10 Essential (primary) hypertension: Secondary | ICD-10-CM | POA: Diagnosis not present

## 2018-05-03 DIAGNOSIS — Z780 Asymptomatic menopausal state: Secondary | ICD-10-CM | POA: Diagnosis not present

## 2018-05-03 DIAGNOSIS — M5440 Lumbago with sciatica, unspecified side: Secondary | ICD-10-CM | POA: Diagnosis not present

## 2018-05-09 ENCOUNTER — Other Ambulatory Visit: Payer: Self-pay | Admitting: Physician Assistant

## 2018-05-09 DIAGNOSIS — E2839 Other primary ovarian failure: Secondary | ICD-10-CM

## 2018-05-09 DIAGNOSIS — Z1231 Encounter for screening mammogram for malignant neoplasm of breast: Secondary | ICD-10-CM

## 2018-07-08 ENCOUNTER — Ambulatory Visit: Payer: Medicare HMO

## 2018-07-08 ENCOUNTER — Other Ambulatory Visit: Payer: Medicare HMO

## 2018-12-04 ENCOUNTER — Inpatient Hospital Stay (HOSPITAL_COMMUNITY)
Admission: EM | Admit: 2018-12-04 | Discharge: 2018-12-12 | DRG: 177 | Disposition: A | Discharge status: Home Health | Payer: Medicare Other | Attending: Family Medicine | Admitting: Family Medicine

## 2018-12-04 ENCOUNTER — Other Ambulatory Visit: Payer: Self-pay

## 2018-12-04 ENCOUNTER — Emergency Department (HOSPITAL_COMMUNITY): Payer: Medicare Other

## 2018-12-04 DIAGNOSIS — Z886 Allergy status to analgesic agent status: Secondary | ICD-10-CM

## 2018-12-04 DIAGNOSIS — N179 Acute kidney failure, unspecified: Secondary | ICD-10-CM | POA: Diagnosis present

## 2018-12-04 DIAGNOSIS — Z79890 Hormone replacement therapy (postmenopausal): Secondary | ICD-10-CM

## 2018-12-04 DIAGNOSIS — E860 Dehydration: Secondary | ICD-10-CM | POA: Diagnosis present

## 2018-12-04 DIAGNOSIS — J1289 Other viral pneumonia: Secondary | ICD-10-CM | POA: Diagnosis present

## 2018-12-04 DIAGNOSIS — E871 Hypo-osmolality and hyponatremia: Secondary | ICD-10-CM | POA: Diagnosis present

## 2018-12-04 DIAGNOSIS — Z79899 Other long term (current) drug therapy: Secondary | ICD-10-CM

## 2018-12-04 DIAGNOSIS — Z6841 Body Mass Index (BMI) 40.0 and over, adult: Secondary | ICD-10-CM

## 2018-12-04 DIAGNOSIS — Z8349 Family history of other endocrine, nutritional and metabolic diseases: Secondary | ICD-10-CM

## 2018-12-04 DIAGNOSIS — G4733 Obstructive sleep apnea (adult) (pediatric): Secondary | ICD-10-CM | POA: Diagnosis present

## 2018-12-04 DIAGNOSIS — J440 Chronic obstructive pulmonary disease with acute lower respiratory infection: Secondary | ICD-10-CM | POA: Diagnosis present

## 2018-12-04 DIAGNOSIS — I10 Essential (primary) hypertension: Secondary | ICD-10-CM | POA: Diagnosis present

## 2018-12-04 DIAGNOSIS — M349 Systemic sclerosis, unspecified: Secondary | ICD-10-CM | POA: Diagnosis present

## 2018-12-04 DIAGNOSIS — Z8249 Family history of ischemic heart disease and other diseases of the circulatory system: Secondary | ICD-10-CM | POA: Diagnosis not present

## 2018-12-04 DIAGNOSIS — Z91048 Other nonmedicinal substance allergy status: Secondary | ICD-10-CM | POA: Diagnosis not present

## 2018-12-04 DIAGNOSIS — M797 Fibromyalgia: Secondary | ICD-10-CM | POA: Diagnosis present

## 2018-12-04 DIAGNOSIS — J159 Unspecified bacterial pneumonia: Secondary | ICD-10-CM | POA: Diagnosis present

## 2018-12-04 DIAGNOSIS — J9601 Acute respiratory failure with hypoxia: Secondary | ICD-10-CM

## 2018-12-04 DIAGNOSIS — Z23 Encounter for immunization: Secondary | ICD-10-CM

## 2018-12-04 DIAGNOSIS — E039 Hypothyroidism, unspecified: Secondary | ICD-10-CM | POA: Diagnosis present

## 2018-12-04 DIAGNOSIS — M3500 Sicca syndrome, unspecified: Secondary | ICD-10-CM | POA: Diagnosis present

## 2018-12-04 DIAGNOSIS — Z888 Allergy status to other drugs, medicaments and biological substances status: Secondary | ICD-10-CM | POA: Diagnosis not present

## 2018-12-04 DIAGNOSIS — J1282 Pneumonia due to coronavirus disease 2019: Secondary | ICD-10-CM

## 2018-12-04 DIAGNOSIS — H3530 Unspecified macular degeneration: Secondary | ICD-10-CM | POA: Diagnosis present

## 2018-12-04 DIAGNOSIS — K219 Gastro-esophageal reflux disease without esophagitis: Secondary | ICD-10-CM | POA: Diagnosis present

## 2018-12-04 DIAGNOSIS — U071 COVID-19: Secondary | ICD-10-CM | POA: Diagnosis present

## 2018-12-04 DIAGNOSIS — D509 Iron deficiency anemia, unspecified: Secondary | ICD-10-CM | POA: Diagnosis present

## 2018-12-04 DIAGNOSIS — E861 Hypovolemia: Secondary | ICD-10-CM | POA: Diagnosis present

## 2018-12-04 DIAGNOSIS — J069 Acute upper respiratory infection, unspecified: Secondary | ICD-10-CM

## 2018-12-04 HISTORY — DX: COVID-19: U07.1

## 2018-12-04 HISTORY — DX: Acute kidney failure, unspecified: N17.9

## 2018-12-04 LAB — FIBRINOGEN: Fibrinogen: >800 mg/dL — ABNORMAL HIGH (ref 210–475)

## 2018-12-04 LAB — COMPREHENSIVE METABOLIC PANEL WITH GFR
ALT: 13 U/L (ref 0–44)
AST: 37 U/L (ref 15–41)
Albumin: 3.3 g/dL — ABNORMAL LOW (ref 3.5–5.0)
Alkaline Phosphatase: 53 U/L (ref 38–126)
Anion gap: 15 (ref 5–15)
BUN: 21 mg/dL (ref 8–23)
CO2: 24 mmol/L (ref 22–32)
Calcium: 8.2 mg/dL — ABNORMAL LOW (ref 8.9–10.3)
Chloride: 89 mmol/L — ABNORMAL LOW (ref 98–111)
Creatinine, Ser: 1.92 mg/dL — ABNORMAL HIGH (ref 0.44–1.00)
GFR calc Af Amer: 31 mL/min — ABNORMAL LOW
GFR calc non Af Amer: 27 mL/min — ABNORMAL LOW
Glucose, Bld: 102 mg/dL — ABNORMAL HIGH (ref 70–99)
Potassium: 3.7 mmol/L (ref 3.5–5.1)
Sodium: 128 mmol/L — ABNORMAL LOW (ref 135–145)
Total Bilirubin: 1.5 mg/dL — ABNORMAL HIGH (ref 0.3–1.2)
Total Protein: 8 g/dL (ref 6.5–8.1)

## 2018-12-04 LAB — TRIGLYCERIDES: Triglycerides: 111 mg/dL

## 2018-12-04 LAB — PROCALCITONIN: Procalcitonin: 3.72 ng/mL

## 2018-12-04 LAB — CBC WITH DIFFERENTIAL/PLATELET
Abs Immature Granulocytes: 0.02 10*3/uL (ref 0.00–0.07)
Basophils Absolute: 0 10*3/uL (ref 0.0–0.1)
Basophils Relative: 0 %
Eosinophils Absolute: 0 10*3/uL (ref 0.0–0.5)
Eosinophils Relative: 0 %
HCT: 30.8 % — ABNORMAL LOW (ref 36.0–46.0)
Hemoglobin: 9.6 g/dL — ABNORMAL LOW (ref 12.0–15.0)
Immature Granulocytes: 0 %
Lymphocytes Relative: 14 %
Lymphs Abs: 1.1 10*3/uL (ref 0.7–4.0)
MCH: 24.2 pg — ABNORMAL LOW (ref 26.0–34.0)
MCHC: 31.2 g/dL (ref 30.0–36.0)
MCV: 77.8 fL — ABNORMAL LOW (ref 80.0–100.0)
Monocytes Absolute: 0.3 10*3/uL (ref 0.1–1.0)
Monocytes Relative: 4 %
Neutro Abs: 6.1 10*3/uL (ref 1.7–7.7)
Neutrophils Relative %: 82 %
Platelets: 284 10*3/uL (ref 150–400)
RBC: 3.96 MIL/uL (ref 3.87–5.11)
RDW: 14.6 % (ref 11.5–15.5)
WBC: 7.5 10*3/uL (ref 4.0–10.5)
nRBC: 0 % (ref 0.0–0.2)

## 2018-12-04 LAB — LACTATE DEHYDROGENASE: LDH: 360 U/L — ABNORMAL HIGH (ref 98–192)

## 2018-12-04 LAB — D-DIMER, QUANTITATIVE: D-Dimer, Quant: 1.46 ug{FEU}/mL — ABNORMAL HIGH (ref 0.00–0.50)

## 2018-12-04 LAB — C-REACTIVE PROTEIN: CRP: 24.4 mg/dL — ABNORMAL HIGH

## 2018-12-04 LAB — FERRITIN: Ferritin: 310 ng/mL — ABNORMAL HIGH (ref 11–307)

## 2018-12-04 LAB — SARS CORONAVIRUS 2 BY RT PCR (HOSPITAL ORDER, PERFORMED IN ~~LOC~~ HOSPITAL LAB): SARS Coronavirus 2: POSITIVE — AB

## 2018-12-04 LAB — LACTIC ACID, PLASMA: Lactic Acid, Venous: 1.4 mmol/L (ref 0.5–1.9)

## 2018-12-04 MED ORDER — SODIUM CHLORIDE 0.45 % IV SOLN
INTRAVENOUS | Status: AC
  Administered 2018-12-04: 16:00:00 via INTRAVENOUS

## 2018-12-04 MED ORDER — CHLORTHALIDONE 50 MG PO TABS
50.0000 mg | ORAL_TABLET | Freq: Every day | ORAL | Status: DC
  Administered 2018-12-05: 50 mg via ORAL
  Filled 2018-12-04 (×3): qty 1

## 2018-12-04 MED ORDER — ADULT MULTIVITAMIN W/MINERALS CH
1.0000 | ORAL_TABLET | Freq: Every day | ORAL | Status: DC
  Administered 2018-12-05 – 2018-12-12 (×8): 1 via ORAL
  Filled 2018-12-04 (×8): qty 1

## 2018-12-04 MED ORDER — GUAIFENESIN ER 600 MG PO TB12
600.0000 mg | ORAL_TABLET | Freq: Two times a day (BID) | ORAL | Status: DC | PRN
  Administered 2018-12-04 – 2018-12-09 (×2): 600 mg via ORAL
  Filled 2018-12-04 (×2): qty 1

## 2018-12-04 MED ORDER — ACETAMINOPHEN 325 MG PO TABS
650.0000 mg | ORAL_TABLET | ORAL | Status: DC | PRN
  Administered 2018-12-04 – 2018-12-07 (×2): 650 mg via ORAL
  Filled 2018-12-04 (×2): qty 2

## 2018-12-04 MED ORDER — SODIUM CHLORIDE 0.9 % IV SOLN
2.0000 g | Freq: Every day | INTRAVENOUS | Status: AC
  Administered 2018-12-04 – 2018-12-08 (×5): 2 g via INTRAVENOUS
  Filled 2018-12-04 (×5): qty 20

## 2018-12-04 MED ORDER — GUAIFENESIN-DM 100-10 MG/5ML PO SYRP
5.0000 mL | ORAL_SOLUTION | ORAL | Status: DC | PRN
  Administered 2018-12-04 – 2018-12-09 (×2): 5 mL via ORAL
  Filled 2018-12-04 (×2): qty 10

## 2018-12-04 MED ORDER — DEXAMETHASONE 6 MG PO TABS
6.0000 mg | ORAL_TABLET | ORAL | Status: DC
  Administered 2018-12-04 – 2018-12-11 (×8): 6 mg via ORAL
  Filled 2018-12-04: qty 2
  Filled 2018-12-04 (×8): qty 1

## 2018-12-04 MED ORDER — TRAMADOL HCL 50 MG PO TABS
100.0000 mg | ORAL_TABLET | Freq: Four times a day (QID) | ORAL | Status: DC | PRN
  Administered 2018-12-04 – 2018-12-09 (×5): 100 mg via ORAL
  Filled 2018-12-04 (×5): qty 2

## 2018-12-04 MED ORDER — ACETAMINOPHEN 325 MG PO TABS
650.0000 mg | ORAL_TABLET | Freq: Once | ORAL | Status: AC
  Administered 2018-12-04: 650 mg via ORAL
  Filled 2018-12-04: qty 2

## 2018-12-04 MED ORDER — METOPROLOL SUCCINATE ER 25 MG PO TB24
50.0000 mg | ORAL_TABLET | Freq: Every day | ORAL | Status: DC
  Administered 2018-12-05: 09:00:00 50 mg via ORAL
  Filled 2018-12-04: qty 2

## 2018-12-04 MED ORDER — SODIUM CHLORIDE 0.9 % IV SOLN
100.0000 mg | INTRAVENOUS | Status: AC
  Administered 2018-12-05 – 2018-12-08 (×4): 100 mg via INTRAVENOUS
  Filled 2018-12-04 (×4): qty 20

## 2018-12-04 MED ORDER — ENOXAPARIN SODIUM 30 MG/0.3ML ~~LOC~~ SOLN
30.0000 mg | SUBCUTANEOUS | Status: DC
  Filled 2018-12-04: qty 0.3

## 2018-12-04 MED ORDER — DICLOFENAC SODIUM 1 % TD GEL
4.0000 g | Freq: Two times a day (BID) | TRANSDERMAL | Status: DC | PRN
  Filled 2018-12-04: qty 100

## 2018-12-04 MED ORDER — LISINOPRIL 20 MG PO TABS
40.0000 mg | ORAL_TABLET | Freq: Every day | ORAL | Status: DC
  Administered 2018-12-05: 40 mg via ORAL
  Filled 2018-12-04: qty 2

## 2018-12-04 MED ORDER — ACETAMINOPHEN-CODEINE #3 300-30 MG PO TABS: 1.0000 | ORAL_TABLET | ORAL | Status: DC | PRN

## 2018-12-04 MED ORDER — SODIUM CHLORIDE 0.9 % IV SOLN
200.0000 mg | Freq: Once | INTRAVENOUS | Status: AC
  Administered 2018-12-04: 200 mg via INTRAVENOUS
  Filled 2018-12-04: qty 40

## 2018-12-04 MED ORDER — ALBUTEROL SULFATE HFA 108 (90 BASE) MCG/ACT IN AERS
1.0000 | INHALATION_SPRAY | Freq: Four times a day (QID) | RESPIRATORY_TRACT | Status: DC | PRN
  Administered 2018-12-04: 2 via RESPIRATORY_TRACT
  Filled 2018-12-04: qty 6.7

## 2018-12-04 MED ORDER — SODIUM CHLORIDE 0.9 % IV BOLUS
1000.0000 mL | Freq: Once | INTRAVENOUS | Status: AC
  Administered 2018-12-04: 1000 mL via INTRAVENOUS

## 2018-12-04 MED ORDER — FLUTICASONE PROPIONATE 50 MCG/ACT NA SUSP
2.0000 | Freq: Every day | NASAL | Status: DC | PRN
  Filled 2018-12-04: qty 16

## 2018-12-04 MED ORDER — LEVOTHYROXINE SODIUM 50 MCG PO TABS
50.0000 ug | ORAL_TABLET | Freq: Every day | ORAL | Status: DC
  Administered 2018-12-05 – 2018-12-12 (×8): 50 ug via ORAL
  Filled 2018-12-04 (×8): qty 1

## 2018-12-04 MED ORDER — DEXTROSE 5 % IV SOLN
250.0000 mg | Freq: Every day | INTRAVENOUS | Status: AC
  Administered 2018-12-04 – 2018-12-08 (×5): 250 mg via INTRAVENOUS
  Filled 2018-12-04 (×5): qty 250

## 2018-12-04 MED ORDER — ENOXAPARIN SODIUM 60 MG/0.6ML ~~LOC~~ SOLN
55.0000 mg | SUBCUTANEOUS | Status: DC
  Administered 2018-12-04 – 2018-12-11 (×8): 55 mg via SUBCUTANEOUS
  Filled 2018-12-04 (×8): qty 0.6

## 2018-12-04 MED ORDER — LORATADINE 10 MG PO TABS: 10.0000 mg | ORAL_TABLET | Freq: Every day | ORAL | Status: DC | PRN

## 2018-12-04 NOTE — ED Triage Notes (Signed)
Patient complains of cough and congestion with body aches x 2 days. States that the cough makes her chest hurt. Describes white sputum. Alert and oriented, NAD

## 2018-12-04 NOTE — H&P (Signed)
History and Physical    Nancy [name]:[id number] DOB: 11/04/1955 DOA: 12/04/2018  PCP: Aldine Contes, MD (Confirm with patient/family/NH records and if not entered, this has to be entered at [facility] point of entry) Patient coming from: Coming from home  I have personally briefly reviewed patient's old medical records in [place]  Chief Complaint: Cough, shortness of breath.  HPI: Nancy [name] is a 63 y.o. female with medical history significant of obesity, hypertension, macular degeneration whose daughter has been covid positive and is a source of exposure.  She reports a 2-week history of persistent cough and has had progressively increasing shortness of breath.  Because of her increasing shortness of breath and rapid respirations she comes to the emergency department for further evaluation.  Patient is uncomfortable but not in distress.  She is pain-free.    ED Course: Patient is hemodynamically stable in the ED.  Respiratory rates in the high 20s, O2 saturations are in the low 90s.  Rapid COVID screen is positive.  White blood count is normal.  Inflammatory markers are positive with an elevated LDH, elevated ferritin, elevated CRP at 24.4, positive d-dimer at 1.46, fibrinogen greater than 800.  His x-ray revealed patchy groundglass infiltrates right greater than left.  AKI w/ Cr 1.9 has received 1 L IVF. Patient now is referred for admission for COVID-19 related pneumonia.  Review of Systems: As per HPI otherwise 10 point review of systems negative. Denies chest pain. Admits to Nausea, episode of emesis several days ago, loose stools, diffuse myalgias   Past Medical History:  Diagnosis Date  . Back pain    chronic low back pain L4-5 discectomy:11/99. degenerative thoracic spondylotic changes 4/07  . Chest pain    neg adenosine myoview.  . Hypertension    no LVH EKG-7/05, nl M/C ratio 4/07  . Hypothyroidism    09/1998  . Lower extremity edema    echo EF 55-65% w/o evidence  of Dias dysfx.,   . Menorrhagia   . Microcytic anemia    09/1998. Hb-10.5/MCV 76. needs ferritin to determine ACD vs IDA  . Morbid obesity ([place])   . Obstructive sleep apnea    severe 7/05 RD 161 per hr. /CPAP 18cwp  . Polyarthralgia    (knee/back/ankle) w/dx of fibromyalgia? given by [facility] rheum, knee pain chronic 2/2 obesity, fibromyalgia and Sjogren's.  . Primary Sjogren's syndrome ([place])    anti Ro+;ANA>1:1280 in homogen pattern, negative ds DNA/RF/anti Smith/RNP/C3-4 comp/la/jo1/Scleroderma/centromere, neg HIV/ACE/Hep B/C, nl CXR 2/05, schirmer salivary glandtest not done, symptom rx:eye drops/prednisone/plaquenil referral to [facility] rheum, Dr. Wynelle Cleveland 3/07. saw [name] but stopped 2/2 cost.  . Uterine bleeding     Past Surgical History:  Procedure Laterality Date  . EXAMINATION UNDER ANESTHESIA  11/23/2011   Procedure: EXAM UNDER ANESTHESIA;  Surgeon: Osborne Oman, MD;  Location: [facility];  Service: Gynecology;  Laterality: N/A;  . HYSTEROSCOPY W/D&C  11/23/2011   Procedure: DILATATION AND CURETTAGE /HYSTEROSCOPY;  Surgeon: Osborne Oman, MD;  Location: [facility];  Service: Gynecology;  Laterality: N/A;  . LUMBAR FUSION  1999  . SHOULDER SURGERY  unk  . TONSILLECTOMY    . TUBAL LIGATION  1977  . WISDOM TOOTH EXTRACTION       reports that she has never smoked. She has never used smokeless tobacco. She reports that she does not drink alcohol or use drugs.  Allergies  Allergen Reactions  . Amlodipine     Lower extremity swelling.  This reaction only  occurred when taking GENERIC amlodipine.  She previously tolerated brand name Norvasc well.  . Coconut Fatty Acids     Break out  . Naproxen Sodium Nausea And Vomiting    Says she can take ibuprofen     Family History  Problem Relation Age of Onset  . Hypertension Mother   . Cancer Mother   . Cancer Father   . Cancer Sister   . Ovarian cancer Other        family history  . Breast cancer Other        family  history in 1st degree relatives.  . Thyroid disease Sister   . Colon cancer Neg Hx   . Stomach cancer Neg Hx    Soc Hx - married. Has 2 sons, three daughters, [phone] grands, many great-grands. Lives with husband and they are I-ADLs. 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   Prior to Admission medications   Medication Sig Start Date End Date Taking? Authorizing Provider  acetaminophen-codeine (TYLENOL #3) 300-30 MG tablet Take 1 tablet by mouth every 4 (four) hours as needed for moderate pain. diag code M25.569 03/22/18   Earl LagosNarendra, Nischal, MD  albuterol (PROVENTIL HFA;VENTOLIN HFA) 108 (90 Base) MCG/ACT inhaler Inhale 1-2 puffs every 6 (six) hours as needed into the lungs for wheezing or shortness of breath. 01/22/17   Arthor CaptainHarris, Abigail, PA-C  chlorthalidone (HYGROTON) 50 MG tablet Take 1 tablet (50 mg total) by mouth daily. 07/09/17   Angelita InglesWinfrey, William B, MD  diclofenac sodium (VOLTAREN) 1 % GEL Apply 4 g topically 2 (two) times daily as needed (back pain). 03/22/18   Earl LagosNarendra, Nischal, MD  fluticasone (FLONASE) 50 MCG/ACT nasal spray SPRAY 2 SPRAYS INTO EACH NOSTRIL EVERY DAY Patient taking differently: Place 2 sprays into both nostrils daily as needed for allergies.  10/26/17   Earl LagosNarendra, Nischal, MD  guaiFENesin (MUCINEX) 600 MG 12 hr tablet Take 600 mg by mouth 2 (two) times daily as needed for cough or to loosen phlegm.    [provider]  levothyroxine (SYNTHROID, LEVOTHROID) 50 MCG tablet TAKE 1 TABLET BY MOUTH EVERY DAY Patient taking differently: Take 50 mcg by mouth at bedtime.  12/22/17   Earl LagosNarendra, Nischal, MD  lisinopril (PRINIVIL,ZESTRIL) 40 MG tablet Take 1 tablet (40 mg total) by mouth daily. 07/09/17 07/09/18  Angelita InglesWinfrey, William B, MD  loratadine (CLARITIN) 10 MG tablet Take 1 tablet (10 mg total) by mouth daily as needed for allergies. 03/22/18   Earl LagosNarendra, Nischal, MD  metoprolol tartrate (LOPRESSOR) 50 MG tablet TAKE 1 TABLET BY MOUTH  TWICE A DAY 04/08/18   Earl LagosNarendra, Nischal, MD    Physical Exam: Vitals:   12/04/18 1330 12/04/18 1345 12/04/18 1430 12/04/18 1445  BP: (!) 106/57 (!) 107/59 (!) 120/58 (!) 113/58  Pulse: 79 77 84 82  Resp: (!) 26 (!) 21 (!) 24 (!) 23  Temp:      TempSrc:      SpO2: 100% 100% 94% 94%  Weight:      Height:        Constitutional: NAD, calm, comfortable Vitals:   12/04/18 1330 12/04/18 1345 12/04/18 1430 12/04/18 1445  BP: (!) 106/57 (!) 107/59 (!) 120/58 (!) 113/58  Pulse: 79 77 84 82  Resp: (!) 26 (!) 21 (!) 24 (!) 23  Temp:      TempSrc:      SpO2: 100% 100% 94% 94%  Weight:  Height:       General appearance - obese woman who is not in distress but is uncomfortable. Eyes: PERRL, lids and conjunctivae normal ENMT: Mucous membranes are moist. Posterior pharynx clear of any exudate or lesions.Edentulous maxilla, front and incisors present mandible.  Neck: obese, normal, supple, no masses, no thyromegaly Respiratory: clear to auscultation bilaterally, no wheezing, no crackles.Generally decreased breathsounds, tachypneic at 24. No accessory muscle use, no neck retractions  Cardiovascular: Regular rate and rhythm, no murmurs / rubs / gallops. No extremity edema. 2+ pedal pulses. No carotid bruits.  Abdomen: obese, mild diffuse  tenderness, no masses palpated. No hepatosplenomegaly. Bowel sounds positive.  Musculoskeletal: no clubbing / cyanosis. No joint deformity upper and lower extremities. Good ROM, no contractures. Normal muscle tone.  Skin: no rashes, lesions, ulcers. No induration Neurologic: CN 2-12 grossly intact. Sensation intact. Strength 4/5 in all 4.  Psychiatric: Normal judgment and insight. Alert and oriented x 3. Normal mood.     Labs on Admission: I have personally reviewed following labs and imaging studies  CBC: Recent Labs  Lab 12/04/18 1221  WBC 7.5  NEUTROABS 6.1  HGB 9.6*  HCT 30.8*  MCV 77.8*  PLT 284   Basic Metabolic Panel: Recent Labs  Lab  12/04/18 1221  NA 128*  K 3.7  CL 89*  CO2 24  GLUCOSE 102*  BUN 21  CREATININE 1.92*  CALCIUM 8.2*   GFR: Estimated Creatinine Clearance: 35.4 mL/min (A) (by C-G formula based on SCr of 1.92 mg/dL (H)). Liver Function Tests: Recent Labs  Lab 12/04/18 1221  AST 37  ALT 13  ALKPHOS 53  BILITOT 1.5*  PROT 8.0  ALBUMIN 3.3*   No results for input(s): LIPASE, AMYLASE in the last 168 hours. No results for input(s): AMMONIA in the last 168 hours. Coagulation Profile: No results for input(s): INR, PROTIME in the last 168 hours. Cardiac Enzymes: No results for input(s): CKTOTAL, CKMB, CKMBINDEX, TROPONINI in the last 168 hours. BNP (last 3 results) No results for input(s): PROBNP in the last 8760 hours. HbA1C: No results for input(s): HGBA1C in the last 72 hours. CBG: No results for input(s): GLUCAP in the last 168 hours. Lipid Profile: Recent Labs    12/04/18 1221  TRIG 111   Thyroid Function Tests: No results for input(s): TSH, T4TOTAL, FREET4, T3FREE, THYROIDAB in the last 72 hours. Anemia Panel: Recent Labs    12/04/18 1221  FERRITIN 310*   Urine analysis:    Component Value Date/Time   COLORURINE YELLOW 11/14/2014 2150   APPEARANCEUR Clear 11/22/2015 1054   LABSPEC 1.014 11/14/2014 2150   PHURINE 6.5 11/14/2014 2150   GLUCOSEU Negative 11/22/2015 1054   GLUCOSEU NEG mg/dL 29/51/8841 6606   HGBUR NEGATIVE 11/14/2014 2150   HGBUR negative 07/29/2006 0856   BILIRUBINUR Negative 11/22/2015 1054   KETONESUR NEGATIVE 11/14/2014 2150   PROTEINUR Negative 11/22/2015 1054   PROTEINUR NEGATIVE 11/14/2014 2150   UROBILINOGEN 0.2 11/22/2015 1051   UROBILINOGEN 1.0 11/14/2014 2150   NITRITE Negative 11/22/2015 1054   NITRITE NEGATIVE 11/14/2014 2150   LEUKOCYTESUR 2+ (A) 11/22/2015 1054    Radiological Exams on Admission: Dg Chest 2 View  Result Date: 12/04/2018 CLINICAL DATA:  Coughing congestion. EXAM: CHEST - 2 VIEW COMPARISON:  03/22/2018 FINDINGS:  Two-view exam shows patchy ground-glass opacities in both lungs, right greater than left. No associated pleural effusion. Cardiopericardial silhouette is at upper limits of normal for size. The visualized bony structures of the thorax are intact. IMPRESSION: Patchy  bilateral ground-glass opacities, right greater than left without pleural effusion. Imaging features raise concern for atypical infection, including viral pneumonia. I personally called these results to Dr. Juleen China in the emergency department at approximately 1012 hours on 12/04/2018. Electronically Signed   By: Kennith Center M.D.   On: 12/04/2018 10:13    EKG: Independently reviewed. NSR, non-specific T wave abnl  Assessment/Plan Active Problems:   Pneumonia due to COVID-19 virus   Essential hypertension   Obstructive sleep apnea   Hypothyroidism   AKI (acute kidney injury) (HCC)  (please populate well all problems here in Problem List. (For example, if patient is on BP meds at home and you resume or decide to hold them, it is a problem that needs to be her. Same for CAD, COPD, HLD and so on)   1. Covid 19 pneumonia - patient covid +, ground glass infiltrates on CXR, elevated inflammatory markers, mild hypoxemia, tachypniec. Plan Admit to [facility]  O2 at 2 liters, keep sats > 90%  Dexamethasone  Consult for Remdesivir  Covid precautiions  2. AKI - Baseline Cr 0.9 now elevated. Most likely prerenal azotemia. Received 1 L IVF in ED Plan Hydrate: IVF 1/2 NS 75 cc/hr x 12 hrs  F/u Bmet  3. Hypothyroidism - will continue home meds  4. HTN- will continue home meds  5. OSA - on CPAP at home Plan` Respiratory consult for CPAP   DVT prophylaxis: . Lovenox(Lovenox/Heparin/SCD's/anticoagulated/None (if comfort care) Code Status: Full code (Full/Partial (specify details) Family Communication: spoke with spouse (Specify name, relationship. Do not write "discussed with patient". Specify tel # if discussed over the phone) Disposition Plan:  home in 5-7 days (specify when and where you expect patient to be discharged) Consults called: none (with names) Admission status: inpatient  Covid + patient: examiner wore full PPE: N-95 mask, surgical mask, eye goggles, full gown and double gloved - donned and doffed appropriately.   Illene Regulus MD [facility] Pager [phone]  If 7PM-7AM, please contact night-coverage [email or web address] Password [facility]  12/04/2018, 2:54 PM

## 2018-12-04 NOTE — Progress Notes (Addendum)
Pharmacy consult - Remdesivir  58 yof presenting COVID-19 +. Pharmacy consulted for Remdesivir. Patient meets all criteria.  Plan: Remdesivir 200mg  IV x 1; then 100mg  IV to complete 5 total doses - 1st dose to be given at [place] Monitor LFTs, clinical progress   Elicia Lamp, PharmD, BCPS Please check AMION for all [place] contact numbers Clinical Pharmacist 12/04/2018 2:52 PM  Addendum  Ok to increase her Lovenox to 55mg  SQ qday per Kerby Less. CrCl>30  Onnie Boer, PharmD, [place], AAHIVP, CPP Infectious Disease Pharmacist 12/04/2018 5:47 PM

## 2018-12-04 NOTE — ED Notes (Signed)
[facility] called for transport to [facility], pt already on transport list

## 2018-12-04 NOTE — ED Provider Notes (Signed)
[facility] EMERGENCY DEPARTMENT Provider Note   CSN: [id number] Arrival date & time: 12/04/18  3536     History   Chief Complaint No chief complaint on file.   HPI Nancy [name] is a 63 y.o. female.     HPI   63 year old female with cough, congestion, body aches and subjective fevers.  She began feeling poorly almost 2 weeks ago.  Symptoms worse in the last few days in terms of her cough, generalized weakness and feeling short of breath.  Subjective fevers at home.  Did not take her temperature.  She reports that her daughter is known COVID positive.  Past Medical History:  Diagnosis Date   Back pain    chronic low back pain L4-5 discectomy:11/99. degenerative thoracic spondylotic changes 4/07   Chest pain    neg adenosine myoview.   Hypertension    no LVH EKG-7/05, nl M/C ratio 4/07   Hypothyroidism    09/1998   Lower extremity edema    echo EF 55-65% w/o evidence of Dias dysfx.,    Menorrhagia    Microcytic anemia    09/1998. Hb-10.5/MCV 76. needs ferritin to determine ACD vs IDA   Morbid obesity (HCC)    Obstructive sleep apnea    severe 7/05 RD 161 per hr. /CPAP 18cwp   Polyarthralgia    (knee/back/ankle) w/dx of fibromyalgia? given by [facility] rheum, knee pain chronic 2/2 obesity, fibromyalgia and Sjogren's.   Primary Sjogren's syndrome (HCC)    anti Ro+;ANA>1:1280 in homogen pattern, negative ds DNA/RF/anti Smith/RNP/C3-4 comp/la/jo1/Scleroderma/centromere, neg HIV/ACE/Hep B/C, nl CXR 2/05, schirmer salivary glandtest not done, symptom rx:eye drops/prednisone/plaquenil referral to [facility] rheum, [name] 3/07. saw [name] but stopped 2/2 cost.   Uterine bleeding     Patient Active Problem List   Diagnosis Date Noted   Viral URI with cough 11/03/2017   Wheelchair dependence 12/17/2016   Polyarthralgia 12/02/2016   Healthcare maintenance 12/18/2014   GERD (gastroesophageal reflux disease) 04/13/2014   Allergic  rhinitis 01/24/2014   Severe obesity (BMI >= 40) (HCC) 09/14/2013   Abnormal CT of brain 11/19/2011   MACULAR DEGENERATION, BILATERAL 05/22/2008   Essential hypertension 03/17/2006   SICCA SYNDROME 03/17/2006   KNEE PAIN, CHRONIC 03/17/2006   Acute exacerbation of chronic low back pain 03/17/2006   Obstructive sleep apnea 09/07/2003   Hypothyroidism 09/07/1998   ANEMIA, IRON DEFICIENCY NOS 09/07/1998    Past Surgical History:  Procedure Laterality Date   EXAMINATION UNDER ANESTHESIA  11/23/2011   Procedure: EXAM UNDER ANESTHESIA;  Surgeon: Tereso Newcomer, MD;  Location: [facility];  Service: Gynecology;  Laterality: N/A;   HYSTEROSCOPY W/D&C  11/23/2011   Procedure: DILATATION AND CURETTAGE /HYSTEROSCOPY;  Surgeon: Tereso Newcomer, MD;  Location: [facility];  Service: Gynecology;  Laterality: N/A;   LUMBAR FUSION  1999   SHOULDER SURGERY  unk   TONSILLECTOMY     TUBAL LIGATION  1977   WISDOM TOOTH EXTRACTION       OB History   No obstetric history on file.      Home Medications    Prior to Admission medications   Medication Sig Start Date End Date Taking? Authorizing Provider  acetaminophen-codeine (TYLENOL #3) 300-30 MG tablet Take 1 tablet by mouth every 4 (four) hours as needed for moderate pain. diag code M25.569 03/22/18   Earl Lagos, MD  albuterol (PROVENTIL HFA;VENTOLIN HFA) 108 (90 Base) MCG/ACT inhaler Inhale 1-2 puffs every 6 (six) hours as needed into the lungs for wheezing  or shortness of breath. 01/22/17   Margarita Mail, PA-C  chlorthalidone (HYGROTON) 50 MG tablet Take 1 tablet (50 mg total) by mouth daily. 07/09/17   Katherine Roan, MD  diclofenac sodium (VOLTAREN) 1 % GEL Apply 4 g topically 2 (two) times daily as needed (back pain). 03/22/18   Aldine Contes, MD  fluticasone (FLONASE) 50 MCG/ACT nasal spray SPRAY 2 SPRAYS INTO EACH NOSTRIL EVERY DAY Patient taking differently: Place 2 sprays into both nostrils daily as needed for  allergies.  10/26/17   Aldine Contes, MD  guaiFENesin (MUCINEX) 600 MG 12 hr tablet Take 600 mg by mouth 2 (two) times daily as needed for cough or to loosen phlegm.    [provider]  levothyroxine (SYNTHROID, LEVOTHROID) 50 MCG tablet TAKE 1 TABLET BY MOUTH EVERY DAY Patient taking differently: Take 50 mcg by mouth at bedtime.  12/22/17   Aldine Contes, MD  lisinopril (PRINIVIL,ZESTRIL) 40 MG tablet Take 1 tablet (40 mg total) by mouth daily. 07/09/17 07/09/18  Katherine Roan, MD  loratadine (CLARITIN) 10 MG tablet Take 1 tablet (10 mg total) by mouth daily as needed for allergies. 03/22/18   Aldine Contes, MD  metoprolol tartrate (LOPRESSOR) 50 MG tablet TAKE 1 TABLET BY MOUTH TWICE A DAY 04/08/18   Aldine Contes, MD    Family History Family History  Problem Relation Age of Onset   Hypertension Mother    Cancer Mother    Cancer Father    Cancer Sister    Ovarian cancer Other        family history   Breast cancer Other        family history in 1st degree relatives.   Thyroid disease Sister    Colon cancer Neg Hx    Stomach cancer Neg Hx     Social History Social History   Tobacco Use   Smoking status: Never Smoker   Smokeless tobacco: Never Used  Substance Use Topics   Alcohol use: No    Alcohol/week: 0.0 standard drinks   Drug use: No     Allergies   Amlodipine, Coconut fatty acids, and Naproxen sodium   Review of Systems Review of Systems  All systems reviewed and negative, other than as noted in HPI.  Physical Exam Updated Vital Signs BP (!) 135/59 (BP Location: Left Wrist)    Pulse 93    Temp 99.9 F (37.7 C) (Oral)    Resp (!) 24    SpO2 94%   Physical Exam Vitals signs and nursing note reviewed.  Constitutional:      Appearance: She is well-developed. She is obese. She is ill-appearing.  HENT:     Head: Normocephalic and atraumatic.  Eyes:     General:        Right eye: No discharge.        Left eye: No  discharge.     Conjunctiva/sclera: Conjunctivae normal.  Neck:     Musculoskeletal: Neck supple.  Cardiovascular:     Rate and Rhythm: Normal rate and regular rhythm.     Heart sounds: Normal heart sounds. No murmur. No friction rub. No gallop.   Pulmonary:     Comments: tachypnea Abdominal:     General: There is no distension.     Palpations: Abdomen is soft.     Tenderness: There is no abdominal tenderness.  Musculoskeletal:        General: No tenderness.  Skin:    General: Skin is warm and dry.  Neurological:  Mental Status: She is alert.  Psychiatric:        Behavior: Behavior normal.        Thought Content: Thought content normal.      ED Treatments / Results  Labs (all labs ordered are listed, but only abnormal results are displayed) Labs Reviewed  SARS CORONAVIRUS 2 (HOSPITAL ORDER, PERFORMED IN [facility]) - Abnormal; Notable for the following components:      Result Value   SARS Coronavirus 2 POSITIVE (*)    All other components within normal limits  CBC WITH DIFFERENTIAL/PLATELET - Abnormal; Notable for the following components:   Hemoglobin 9.6 (*)    HCT 30.8 (*)    MCV 77.8 (*)    MCH 24.2 (*)    All other components within normal limits  COMPREHENSIVE METABOLIC PANEL - Abnormal; Notable for the following components:   Sodium 128 (*)    Chloride 89 (*)    Glucose, Bld 102 (*)    Creatinine, Ser 1.92 (*)    Calcium 8.2 (*)    Albumin 3.3 (*)    Total Bilirubin 1.5 (*)    GFR calc non Af Amer 27 (*)    GFR calc Af Amer 31 (*)    All other components within normal limits  D-DIMER, QUANTITATIVE (NOT AT [facility]) - Abnormal; Notable for the following components:   D-Dimer, Quant 1.46 (*)    All other components within normal limits  LACTATE DEHYDROGENASE - Abnormal; Notable for the following components:   LDH 360 (*)    All other components within normal limits  FERRITIN - Abnormal; Notable for the following components:   Ferritin 310 (*)     All other components within normal limits  FIBRINOGEN - Abnormal; Notable for the following components:   Fibrinogen >800 (*)    All other components within normal limits  C-REACTIVE PROTEIN - Abnormal; Notable for the following components:   CRP 24.4 (*)    All other components within normal limits  CBC - Abnormal; Notable for the following components:   RBC 3.77 (*)    Hemoglobin 9.0 (*)    HCT 29.4 (*)    MCV 78.0 (*)    MCH 23.9 (*)    All other components within normal limits  COMPREHENSIVE METABOLIC PANEL - Abnormal; Notable for the following components:   Sodium 130 (*)    Chloride 95 (*)    Glucose, Bld 154 (*)    BUN 29 (*)    Creatinine, Ser 1.50 (*)    Calcium 7.5 (*)    Albumin 3.0 (*)    GFR calc non Af Amer 36 (*)    GFR calc Af Amer 42 (*)    All other components within normal limits  D-DIMER, QUANTITATIVE (NOT AT [facility]) - Abnormal; Notable for the following components:   D-Dimer, Quant 1.18 (*)    All other components within normal limits  C-REACTIVE PROTEIN - Abnormal; Notable for the following components:   CRP 23.5 (*)    All other components within normal limits  CBC - Abnormal; Notable for the following components:   WBC 17.2 (*)    RBC 3.66 (*)    Hemoglobin 8.8 (*)    HCT 28.6 (*)    MCV 78.1 (*)    MCH 24.0 (*)    All other components within normal limits  COMPREHENSIVE METABOLIC PANEL - Abnormal; Notable for the following components:   Sodium 129 (*)    Chloride 93 (*)  Glucose, Bld 120 (*)    BUN 37 (*)    Creatinine, Ser 1.34 (*)    Calcium 7.6 (*)    Albumin 3.0 (*)    GFR calc non Af Amer 41 (*)    GFR calc Af Amer 48 (*)    All other components within normal limits  FERRITIN - Abnormal; Notable for the following components:   Ferritin 371 (*)    All other components within normal limits  D-DIMER, QUANTITATIVE (NOT AT [facility]) - Abnormal; Notable for the following components:   D-Dimer, Quant 0.91 (*)    All other components within  normal limits  C-REACTIVE PROTEIN - Abnormal; Notable for the following components:   CRP 19.1 (*)    All other components within normal limits  CBC - Abnormal; Notable for the following components:   WBC 13.8 (*)    RBC 3.86 (*)    Hemoglobin 9.3 (*)    HCT 29.6 (*)    MCV 76.7 (*)    MCH 24.1 (*)    All other components within normal limits  COMPREHENSIVE METABOLIC PANEL - Abnormal; Notable for the following components:   Sodium 132 (*)    Chloride 97 (*)    Glucose, Bld 108 (*)    BUN 38 (*)    Calcium 7.9 (*)    Albumin 2.8 (*)    All other components within normal limits  FERRITIN - Abnormal; Notable for the following components:   Ferritin 415 (*)    All other components within normal limits  D-DIMER, QUANTITATIVE (NOT AT [facility]) - Abnormal; Notable for the following components:   D-Dimer, Quant 0.77 (*)    All other components within normal limits  C-REACTIVE PROTEIN - Abnormal; Notable for the following components:   CRP 12.2 (*)    All other components within normal limits  CULTURE, BLOOD (ROUTINE X 2)  CULTURE, BLOOD (ROUTINE X 2)  LACTIC ACID, PLASMA  PROCALCITONIN  TRIGLYCERIDES  FERRITIN  URINALYSIS, ROUTINE W REFLEX MICROSCOPIC    EKG EKG Interpretation  Date/Time:  yF  with dyspnea. COVID +. With tachypnea, will admit for management.   Nancy [name] was evaluated in Emergency Department on 12/07/2018 for the symptoms described in the history of present illness. She was evaluated in the context of the global COVID-19 pandemic, which necessitated consideration that the patient might be at risk for infection with the SARS-CoV-2 virus that causes COVID-19. Institutional protocols and algorithms that pertain to the evaluation of patients at risk for COVID-19 are in a state of rapid change based on information released by regulatory bodies including the CDC and federal and state organizations. These policies and algorithms were followed during the patient's care in the ED.   Final Clinical Impressions(s) / ED Diagnoses   Final diagnoses:  Acute respiratory disease due to COVID-19 virus    ED Discharge Orders    None       Raeford Razor, MD 12/07/18 [phone]

## 2018-12-05 ENCOUNTER — Encounter (HOSPITAL_COMMUNITY): Payer: Self-pay

## 2018-12-05 DIAGNOSIS — J9601 Acute respiratory failure with hypoxia: Secondary | ICD-10-CM

## 2018-12-05 LAB — CBC
HCT: 29.4 % — ABNORMAL LOW (ref 36.0–46.0)
Hemoglobin: 9 g/dL — ABNORMAL LOW (ref 12.0–15.0)
MCH: 23.9 pg — ABNORMAL LOW (ref 26.0–34.0)
MCHC: 30.6 g/dL (ref 30.0–36.0)
MCV: 78 fL — ABNORMAL LOW (ref 80.0–100.0)
Platelets: 202 10*3/uL (ref 150–400)
RBC: 3.77 MIL/uL — ABNORMAL LOW (ref 3.87–5.11)
RDW: 14.6 % (ref 11.5–15.5)
WBC: 10 10*3/uL (ref 4.0–10.5)
nRBC: 0 % (ref 0.0–0.2)

## 2018-12-05 LAB — C-REACTIVE PROTEIN: CRP: 23.5 mg/dL — ABNORMAL HIGH

## 2018-12-05 LAB — COMPREHENSIVE METABOLIC PANEL WITH GFR
ALT: 12 U/L (ref 0–44)
AST: 31 U/L (ref 15–41)
Albumin: 3 g/dL — ABNORMAL LOW (ref 3.5–5.0)
Alkaline Phosphatase: 54 U/L (ref 38–126)
Anion gap: 13 (ref 5–15)
BUN: 29 mg/dL — ABNORMAL HIGH (ref 8–23)
CO2: 22 mmol/L (ref 22–32)
Calcium: 7.5 mg/dL — ABNORMAL LOW (ref 8.9–10.3)
Chloride: 95 mmol/L — ABNORMAL LOW (ref 98–111)
Creatinine, Ser: 1.5 mg/dL — ABNORMAL HIGH (ref 0.44–1.00)
GFR calc Af Amer: 42 mL/min — ABNORMAL LOW
GFR calc non Af Amer: 36 mL/min — ABNORMAL LOW
Glucose, Bld: 154 mg/dL — ABNORMAL HIGH (ref 70–99)
Potassium: 4 mmol/L (ref 3.5–5.1)
Sodium: 130 mmol/L — ABNORMAL LOW (ref 135–145)
Total Bilirubin: 0.6 mg/dL (ref 0.3–1.2)
Total Protein: 7.3 g/dL (ref 6.5–8.1)

## 2018-12-05 LAB — FERRITIN: Ferritin: 306 ng/mL (ref 11–307)

## 2018-12-05 LAB — D-DIMER, QUANTITATIVE: D-Dimer, Quant: 1.18 ug{FEU}/mL — ABNORMAL HIGH (ref 0.00–0.50)

## 2018-12-05 NOTE — Plan of Care (Signed)
No acute changes this shift. VSS. Fall precautions maintained. Call light in reach  Problem: Education: Goal: Knowledge of risk factors and measures for prevention of condition will improve Outcome: Progressing   Problem: Coping: Goal: Psychosocial and spiritual needs will be supported Outcome: Progressing   Problem: Respiratory: Goal: Will maintain a patent airway Outcome: Progressing Goal: Complications related to the disease process, condition or treatment will be avoided or minimized Outcome: Progressing

## 2018-12-05 NOTE — Progress Notes (Signed)
PROGRESS NOTE    Nancy ChristiansLinda S Thomley  ZOX:[id number] DOB: 11/26/1955 DOA: 12/04/2018 PCP: Earl LagosNarendra, Nischal, MD   Brief Narrative:  Nancy [name] is a 63 y.o. female with medical history significant of obesity, hypertension, macular degeneration whose daughter has been covid positive and is a source of exposure.  She reports a 2-week history of persistent cough and has had progressively increasing shortness of breath.  Because of her increasing shortness of breath and rapid respirations she comes to the emergency department for further evaluation.  Patient is uncomfortable but not in distress.  She is pain-free. In ED patient is hemodynamically stable, respiratory rates in the high 20s, O2 saturations are in the low 90s.  Rapid COVID screen is positive.  White blood count is normal.  Inflammatory markers are positive with an elevated LDH, elevated ferritin, elevated CRP at 24.4, positive d-dimer at 1.46, fibrinogen greater than 800.  His x-ray revealed patchy groundglass infiltrates right greater than left.  AKI w/ Cr 1.9 has received 1 L IVF. Patient now is referred for admission for COVID-19 related pneumonia.  Assessment & Plan:   Principal Problem:   Pneumonia due to COVID-19 virus Active Problems:   Acute respiratory failure with hypoxia (HCC)   Hypothyroidism   Essential hypertension   Obstructive sleep apnea   AKI (acute kidney injury) (HCC)    Acute hypoxic respiratory failure in the setting of COVID-19 pneumonia, POA, improving Rule out concurrent community acquired pneumonia -Patient remains hypoxic from baseline requiring 2 L nasal cannula - wean as tolerated -Patient's elevated procalcitonin at 3.5 signs for concurrent bacterial infection, on azithromycin and ceftriaxone for 5 days, stop date 12/08/2018 -Continue supportive care, incentive spirometry, pronating as tolerated, avoid NSAIDs per protocol -Given elevated procalcitonin and minimally improving CRP will not offer Actemra at  this time although this could be considered in the future after completion of antibiotic therapy Recent Labs    12/04/18 1221 12/05/18 0230  DDIMER 1.46* 1.18*  FERRITIN 310* 306  LDH 360*  --   CRP 24.4* 23.5*    AKI without history of CKD -Creatinine downtrending currently 1.5 -Baseline continues to be around 0.9 -Likely secondary to poor p.o. intake as reported and hypovolemia -Continue to increase p.o. intake as tolerated, consider IV fluids if creatinine does not continue to improve.  Hypovolemic hyponatremia, in the setting of dehydration poor p.o. intake -Change to encourage p.o. intake, given improvement in diet hold off on IV fluids  Microcytic anemia  - Obstructive sleep apnea -Unfortunately unable to offer positive pressure ventilation while COVID positive -Continue supplemental oxygen  Hypertension, essential -Resume home medications  Hypothyroidism -Resume home medications  DVT prophylaxis: Lovenox, weight-based Code Status: Full  Family Communication: Daughter, currently inpatient as well Disposition Plan: Likely disposition home once patient's hypoxia has resolved, completion of Remdesivir course, and clinical improvement.  Suspect patient will need at least another 5 days prior to disposition.  Antimicrobials:  -Remdesivir 12/04/2018 >> 12/08/2018  Subjective: No acute issues or events overnight, patient resting quite comfortably, locates her respiratory status is minimally improving but not yet back to baseline.  Patient otherwise denies chest pain, nausea, vomiting, diarrhea, constipation, headache, fevers, chills.  Objective: Vitals:   12/05/18 0839 12/05/18 0840 12/05/18 0844 12/05/18 0845  BP:  119/66 119/66 113/63  Pulse: 66 63 63 61  Resp: 17 14  15   Temp:  (!) 97.5 F (36.4 C)    TempSrc:  Oral    SpO2: 97% 97%  96%  Weight:  Height:        Intake/Output Summary (Last 24 hours) at 12/05/2018 1405 Last data filed at 12/05/2018 0300  Gross per 24 hour  Intake 1694.41 ml  Output -  Net 1694.41 ml   Filed Weights   12/04/18 1147  Weight: 113.4 kg    Examination:  General:  Pleasantly resting in bed, No acute distress. HEENT:  Normocephalic atraumatic.  Sclerae nonicteric, noninjected.  Extraocular movements intact bilaterally. Neck:  Without mass or deformity.  Trachea is midline. Lungs: Coarse bilateral breath sounds without overt rales or wheeze Heart:  Regular rate and rhythm.  Without murmurs, rubs, or gallops. Abdomen:  Soft, obese, nontender, nondistended.  Without guarding or rebound. Extremities: Without cyanosis, clubbing, edema, or obvious deformity. Vascular:  Dorsalis pedis and posterior tibial pulses palpable bilaterally. Skin:  Warm and dry, no erythema, no ulcerations.  Data Reviewed: I have personally reviewed following labs and imaging studies  CBC: Recent Labs  Lab 12/04/18 1221 12/05/18 0230  WBC 7.5 10.0  NEUTROABS 6.1  --   HGB 9.6* 9.0*  HCT 30.8* 29.4*  MCV 77.8* 78.0*  PLT 284 010   Basic Metabolic Panel: Recent Labs  Lab 12/04/18 1221 12/05/18 0230  NA 128* 130*  K 3.7 4.0  CL 89* 95*  CO2 24 22  GLUCOSE 102* 154*  BUN 21 29*  CREATININE 1.92* 1.50*  CALCIUM 8.2* 7.5*   GFR: Estimated Creatinine Clearance: 45.3 mL/min (A) (by C-G formula based on SCr of 1.5 mg/dL (H)). Liver Function Tests: Recent Labs  Lab 12/04/18 1221 12/05/18 0230  AST 37 31  ALT 13 12  ALKPHOS 53 54  BILITOT 1.5* 0.6  PROT 8.0 7.3  ALBUMIN 3.3* 3.0*    Recent Labs    12/04/18 1221 12/05/18 0230  FERRITIN 310* 306   Sepsis Labs: Recent Labs  Lab 12/04/18 1221  PROCALCITON 3.72  LATICACIDVEN 1.4    Recent Results (from the past 240 hour(s))  SARS Coronavirus 2 [facility] order, Performed in [facility] hospital lab) Nasopharyngeal Nasopharyngeal Swab     Status: Abnormal   Collection Time: 12/04/18 10:50 AM   Specimen: Nasopharyngeal Swab  Result Value Ref Range Status    SARS Coronavirus 2 POSITIVE (A) NEGATIVE Final    Comment: CRITICAL RESULT CALLED TO, READ BACK BY AND VERIFIED WITH: RN DEASHA  [id number] FCP (NOTE) If result is NEGATIVE SARS-CoV-2 target nucleic acids are NOT DETECTED. The SARS-CoV-2 RNA is generally detectable in upper and lower  respiratory specimens during the acute phase of infection. The lowest  concentration of SARS-CoV-2 viral copies this assay can detect is 250  copies / mL. A negative result does not preclude SARS-CoV-2 infection  and should not be used as the sole basis for treatment or other  patient management decisions.  A negative result may occur with  improper specimen collection / handling, submission of specimen other  than nasopharyngeal swab, presence of viral mutation(s) within the  areas targeted by this assay, and inadequate number of viral copies  (<250 copies / mL). A negative result must be combined with clinical  observations, patient history, and epidemiological information. If result is POSITIVE SARS-CoV-2 target nucleic acids are DETECTED. T he SARS-CoV-2 RNA is generally detectable in upper and lower  respiratory specimens during the acute phase of infection.  Positive  results are indicative of active infection with SARS-CoV-2.  Clinical  correlation with patient history and other diagnostic information is  necessary to determine patient infection status.  Positive results do  not rule out bacterial infection or co-infection with other viruses. If result is PRESUMPTIVE POSTIVE SARS-CoV-2 nucleic acids MAY BE PRESENT.   A presumptive positive result was obtained on the submitted specimen  and confirmed on repeat testing.  While 2019 novel coronavirus  (SARS-CoV-2) nucleic acids may be present in the submitted sample  additional confirmatory testing may be necessary for epidemiological  and / or clinical management purposes  to differentiate between  SARS-CoV-2 and other Sarbecovirus currently known  to infect humans.  If clinically indicated additional testing with an alternate test  methodology [phone]) is  advised. The SARS-CoV-2 RNA is generally  detectable in upper and lower respiratory specimens during the acute  phase of infection. The expected result is Negative. Fact Sheet for Patients:  BoilerBrush.com.cy Fact Sheet for Healthcare Providers: https://pope.com/ This test is not yet approved or cleared by the [place] FDA and has been authorized for detection and/or diagnosis of SARS-CoV-2 by FDA under an Emergency Use Authorization (EUA).  This EUA will remain in effect (meaning this test can be used) for the duration of the COVID-19 declaration under Section 564(b)(1) of the Act, 21 U.S.C. section 360bbb-3(b)(1), unless the authorization is terminated or revoked sooner. Performed at [facility], [address], [place], [place] [address]   Blood Culture (routine x 2)     Status: None (Preliminary result)   Collection Time: 12/04/18 12:20 PM   Specimen: BLOOD RIGHT FOREARM  Result Value Ref Range Status   Specimen Description BLOOD RIGHT FOREARM  Final   Special Requests   Final    BOTTLES DRAWN AEROBIC AND ANAEROBIC Blood Culture adequate volume   Culture   Final    NO GROWTH < 24 HOURS Performed at [facility], [address], [place], [place] [address]    Report Status PENDING  Incomplete  Blood Culture (routine x 2)     Status: None (Preliminary result)   Collection Time: 12/04/18  1:19 PM   Specimen: BLOOD  Result Value Ref Range Status   Specimen Description BLOOD LEFT ANTECUBITAL  Final   Special Requests   Final    BOTTLES DRAWN AEROBIC AND ANAEROBIC Blood Culture adequate volume   Culture   Final    NO GROWTH < 24 HOURS Performed at [facility], [address], [place], [place] [address]    Report Status PENDING  Incomplete         Radiology Studies: Dg Chest 2 View   Result Date: 12/04/2018 CLINICAL DATA:  Coughing congestion. EXAM: CHEST - 2 VIEW COMPARISON:  03/22/2018 FINDINGS: Two-view exam shows patchy ground-glass opacities in both lungs, right greater than left. No associated pleural effusion. Cardiopericardial silhouette is at upper limits of normal for size. The visualized bony structures of the thorax are intact. IMPRESSION: Patchy bilateral ground-glass opacities, right greater than left without pleural effusion. Imaging features raise concern for atypical infection, including viral pneumonia. I personally called these results to Dr. Juleen China in the emergency department at approximately 1012 hours on 12/04/2018. Electronically Signed   By: Kennith Center M.D.   On: 12/04/2018 10:13        Scheduled Meds: . chlorthalidone  50 mg Oral Daily  . dexamethasone  6 mg Oral Q24H  . enoxaparin (LOVENOX) injection  55 mg Subcutaneous Q24H  . levothyroxine  50 mcg Oral QAC breakfast  . lisinopril  40 mg Oral Daily  . metoprolol succinate  50 mg Oral Daily  .  multivitamin with minerals  1 tablet Oral Daily   Continuous Infusions: . azithromycin Stopped (12/04/18 1928)  . cefTRIAXone (ROCEPHIN)  IV 20 mL/hr at 12/05/18 0300  . remdesivir 100 mg in NS 250 mL       LOS: 1 day   Time spent:  Azucena Fallen, DO [facility]  If 7PM-7AM, please contact night-coverage [email or web address] Password [email or web address] 12/05/2018, 2:05 PM

## 2018-12-06 LAB — COMPREHENSIVE METABOLIC PANEL WITH GFR
ALT: 12 U/L (ref 0–44)
AST: 27 U/L (ref 15–41)
Albumin: 3 g/dL — ABNORMAL LOW (ref 3.5–5.0)
Alkaline Phosphatase: 54 U/L (ref 38–126)
Anion gap: 11 (ref 5–15)
BUN: 37 mg/dL — ABNORMAL HIGH (ref 8–23)
CO2: 25 mmol/L (ref 22–32)
Calcium: 7.6 mg/dL — ABNORMAL LOW (ref 8.9–10.3)
Chloride: 93 mmol/L — ABNORMAL LOW (ref 98–111)
Creatinine, Ser: 1.34 mg/dL — ABNORMAL HIGH (ref 0.44–1.00)
GFR calc Af Amer: 48 mL/min — ABNORMAL LOW
GFR calc non Af Amer: 41 mL/min — ABNORMAL LOW
Glucose, Bld: 120 mg/dL — ABNORMAL HIGH (ref 70–99)
Potassium: 4.2 mmol/L (ref 3.5–5.1)
Sodium: 129 mmol/L — ABNORMAL LOW (ref 135–145)
Total Bilirubin: 0.4 mg/dL (ref 0.3–1.2)
Total Protein: 7.4 g/dL (ref 6.5–8.1)

## 2018-12-06 LAB — CBC
HCT: 28.6 % — ABNORMAL LOW (ref 36.0–46.0)
Hemoglobin: 8.8 g/dL — ABNORMAL LOW (ref 12.0–15.0)
MCH: 24 pg — ABNORMAL LOW (ref 26.0–34.0)
MCHC: 30.8 g/dL (ref 30.0–36.0)
MCV: 78.1 fL — ABNORMAL LOW (ref 80.0–100.0)
Platelets: 253 10*3/uL (ref 150–400)
RBC: 3.66 MIL/uL — ABNORMAL LOW (ref 3.87–5.11)
RDW: 15 % (ref 11.5–15.5)
WBC: 17.2 10*3/uL — ABNORMAL HIGH (ref 4.0–10.5)
nRBC: 0.1 % (ref 0.0–0.2)

## 2018-12-06 LAB — D-DIMER, QUANTITATIVE: D-Dimer, Quant: 0.91 ug{FEU}/mL — ABNORMAL HIGH (ref 0.00–0.50)

## 2018-12-06 LAB — FERRITIN: Ferritin: 371 ng/mL — ABNORMAL HIGH (ref 11–307)

## 2018-12-06 LAB — C-REACTIVE PROTEIN: CRP: 19.1 mg/dL — ABNORMAL HIGH

## 2018-12-06 NOTE — Progress Notes (Signed)
Spoke with pts spouse Mortimer Fries) updated him on plan of care and informed him that the pt is currently resting, spouse had no further questions, pts VSS, will keep monitoring pt

## 2018-12-06 NOTE — Progress Notes (Signed)
PROGRESS NOTE    Nancy [name]  MVH:[id number] DOB: May 07, 1955 DOA: 12/04/2018 PCP: Earl Lagos, MD   Brief Narrative:  Nancy [name] is a 63 y.o. female with medical history significant of obesity, hypertension, macular degeneration whose daughter has been covid positive and is a source of exposure.  She reports a 2-week history of persistent cough and has had progressively increasing shortness of breath.  Because of her increasing shortness of breath and rapid respirations she comes to the emergency department for further evaluation.  Patient is uncomfortable but not in distress.  She is pain-free. In ED patient is hemodynamically stable, respiratory rates in the high 20s, O2 saturations are in the low 90s.  Rapid COVID screen is positive.  White blood count is normal.  Inflammatory markers are positive with an elevated LDH, elevated ferritin, elevated CRP at 24.4, positive d-dimer at 1.46, fibrinogen greater than 800.  His x-ray revealed patchy groundglass infiltrates right greater than left.  AKI w/ Cr 1.9 has received 1 L IVF. Patient now is referred for admission for COVID-19 related pneumonia.  Assessment & Plan:   Principal Problem:   Pneumonia due to COVID-19 virus Active Problems:   Acute respiratory failure with hypoxia (HCC)   Hypothyroidism   Essential hypertension   Obstructive sleep apnea   AKI (acute kidney injury) (HCC)    Acute hypoxic respiratory failure in the setting of COVID-19 pneumonia, POA, improving Rule out concurrent community acquired pneumonia -Patient remains hypoxic from baseline requiring 2 L nasal cannula - wean as tolerated -Daily ambulatory O2 screen pending -Continue Remdesivir(stop date 10/1); Steroids (stop date 10/6) -Patient's elevated procalcitonin at 3.5 with ? signs for concurrent bacterial infection, on azithromycin and ceftriaxone for 5 days, stop date 12/08/2018 -Continue supportive care, incentive spirometry, pronating as tolerated,  avoid NSAIDs per protocol -Given elevated procalcitonin and minimally improving CRP will not offer Actemra at this time although this could be considered in the future after completion of antibiotic therapy if not improving Recent Labs    12/04/18 1221 12/05/18 0230 12/06/18 0240  DDIMER 1.46* 1.18* 0.91*  FERRITIN 310* 306 371*  LDH 360*  --   --   CRP 24.4* 23.5* 19.1*   AKI without history of CKD, improving -Creatinine downtrending currently 1.34 -Baseline continues to be around 0.9 -Likely secondary to poor p.o. intake as reported and hypovolemia -Continue to increase p.o. intake as tolerated, consider IV fluids if creatinine does not continue to improve.  Hypovolemic hyponatremia, in the setting of dehydration poor p.o. intake -Change to encourage p.o. intake, given improvement in diet hold off on IV fluids  Microcytic anemia  -Near baseline -Essentially stable  Obstructive sleep apnea -Unfortunately unable to offer positive pressure ventilation while COVID positive -Continue supplemental oxygen  Hypertension, essential -Resume home medications  Hypothyroidism -Resume home medications  DVT prophylaxis: Lovenox, weight-based Code Status: Full  Family Communication: Daughter over the phone Disposition Plan: Likely disposition home once patient's hypoxia has resolved, completion of Remdesivir course, and clinical improvement.  Will need to complete remdesivir (10/1) then consider discharge pending hypoxia/ambulation status.  Antimicrobials:  -Remdesivir 12/04/2018 >> 12/08/2018 -Azithromycin/Ceftriaxone 9/27>>10/1  Subjective: No acute issues or events overnight, patient resting quite comfortably, locates her respiratory status is minimally improving but not yet back to baseline.  Patient otherwise denies chest pain, nausea, vomiting, diarrhea, constipation, headache, fevers, chills.  Objective: Vitals:   12/05/18 2200 12/06/18 0416 12/06/18 0737 12/06/18 0741  BP:  90/63 (!) 129/95 (!) 91/53 98/61  Pulse:  63 (!) 59 61 (!) 57  Resp: 15 18 14 14   Temp: 97.8 F (36.6 C) (!) 97.5 F (36.4 C) 97.6 F (36.4 C)   TempSrc: Oral Oral Oral   SpO2: 95% 92% 92% 97%  Weight:      Height:        Intake/Output Summary (Last 24 hours) at 12/06/2018 1226 Last data filed at 12/06/2018 1032 Gross per 24 hour  Intake 240 ml  Output 150 ml  Net 90 ml   Filed Weights   12/04/18 1147  Weight: 113.4 kg    Examination:  General:  Pleasantly resting in bed, No acute distress. HEENT:  Normocephalic atraumatic.  Sclerae nonicteric, noninjected.  Extraocular movements intact bilaterally. Neck:  Without mass or deformity.  Trachea is midline. Lungs: Coarse bilateral breath sounds without overt rales or wheeze Heart:  Regular rate and rhythm.  Without murmurs, rubs, or gallops. Abdomen:  Soft, obese, nontender, nondistended.  Without guarding or rebound. Extremities: Without cyanosis, clubbing, edema, or obvious deformity. Vascular:  Dorsalis pedis and posterior tibial pulses palpable bilaterally. Skin:  Warm and dry, no erythema, no ulcerations.  Data Reviewed: I have personally reviewed following labs and imaging studies  CBC: Recent Labs  Lab 12/04/18 1221 12/05/18 0230 12/06/18 0240  WBC 7.5 10.0 17.2*  NEUTROABS 6.1  --   --   HGB 9.6* 9.0* 8.8*  HCT 30.8* 29.4* 28.6*  MCV 77.8* 78.0* 78.1*  PLT 284 202 253   Basic Metabolic Panel: Recent Labs  Lab 12/04/18 1221 12/05/18 0230 12/06/18 0240  NA 128* 130* 129*  K 3.7 4.0 4.2  CL 89* 95* 93*  CO2 24 22 25   GLUCOSE 102* 154* 120*  BUN 21 29* 37*  CREATININE 1.92* 1.50* 1.34*  CALCIUM 8.2* 7.5* 7.6*   GFR: Estimated Creatinine Clearance: 50.7 mL/min (A) (by C-G formula based on SCr of 1.34 mg/dL (H)). Liver Function Tests: Recent Labs  Lab 12/04/18 1221 12/05/18 0230 12/06/18 0240  AST 37 31 27  ALT 13 12 12   ALKPHOS 53 54 54  BILITOT 1.5* 0.6 0.4  PROT 8.0 7.3 7.4  ALBUMIN 3.3*  3.0* 3.0*    Recent Labs    12/05/18 0230 12/06/18 0240  FERRITIN 306 371*   Sepsis Labs: Recent Labs  Lab 12/04/18 1221  PROCALCITON 3.72  LATICACIDVEN 1.4    Recent Results (from the past 240 hour(s))  SARS Coronavirus 2 [employer](Hospital order, Performed in [facility] hospital lab) Nasopharyngeal Nasopharyngeal Swab     Status: Abnormal   Collection Time: 12/04/18 10:50 AM   Specimen: Nasopharyngeal Swab  Result Value Ref Range Status   SARS Coronavirus 2 POSITIVE (A) NEGATIVE Final    Comment: CRITICAL RESULT CALLED TO, READ BACK BY AND VERIFIED WITH: RN DEASHA  1343 [id number] FCP (NOTE) If result is NEGATIVE SARS-CoV-2 target nucleic acids are NOT DETECTED. The SARS-CoV-2 RNA is generally detectable in upper and lower  respiratory specimens during the acute phase of infection. The lowest  concentration of SARS-CoV-2 viral copies this assay can detect is 250  copies / mL. A negative result does not preclude SARS-CoV-2 infection  and should not be used as the sole basis for treatment or other  patient management decisions.  A negative result may occur with  improper specimen collection / handling, submission of specimen other  than nasopharyngeal swab, presence of viral mutation(s) within the  areas targeted by this assay, and inadequate number of viral copies  (<250 copies / mL). A  negative result must be combined with clinical  observations, patient history, and epidemiological information. If result is POSITIVE SARS-CoV-2 target nucleic acids are DETECTED. T he SARS-CoV-2 RNA is generally detectable in upper and lower  respiratory specimens during the acute phase of infection.  Positive  results are indicative of active infection with SARS-CoV-2.  Clinical  correlation with patient history and other diagnostic information is  necessary to determine patient infection status.  Positive results do  not rule out bacterial infection or co-infection with other viruses. If result  is PRESUMPTIVE POSTIVE SARS-CoV-2 nucleic acids MAY BE PRESENT.   A presumptive positive result was obtained on the submitted specimen  and confirmed on repeat testing.  While 2019 novel coronavirus  (SARS-CoV-2) nucleic acids may be present in the submitted sample  additional confirmatory testing may be necessary for epidemiological  and / or clinical management purposes  to differentiate between  SARS-CoV-2 and other Sarbecovirus currently known to infect humans.  If clinically indicated additional testing with an alternate test  methodology [phone]) is  advised. The SARS-CoV-2 RNA is generally  detectable in upper and lower respiratory specimens during the acute  phase of infection. The expected result is Negative. Fact Sheet for Patients:  BoilerBrush.com.cy Fact Sheet for Healthcare Providers: https://pope.com/ This test is not yet approved or cleared by the [place] FDA and has been authorized for detection and/or diagnosis of SARS-CoV-2 by FDA under an Emergency Use Authorization (EUA).  This EUA will remain in effect (meaning this test can be used) for the duration of the COVID-19 declaration under Section 564(b)(1) of the Act, 21 U.S.C. section 360bbb-3(b)(1), unless the authorization is terminated or revoked sooner. Performed at [facility], [address], [place], [place] [address]   Blood Culture (routine x 2)     Status: None (Preliminary result)   Collection Time: 12/04/18 12:20 PM   Specimen: BLOOD RIGHT FOREARM  Result Value Ref Range Status   Specimen Description BLOOD RIGHT FOREARM  Final   Special Requests   Final    BOTTLES DRAWN AEROBIC AND ANAEROBIC Blood Culture adequate volume   Culture   Final    NO GROWTH 2 DAYS Performed at [facility], [address], [place], [place] [address]    Report Status PENDING  Incomplete  Blood Culture (routine x 2)     Status: None (Preliminary result)    Collection Time: 12/04/18  1:19 PM   Specimen: BLOOD  Result Value Ref Range Status   Specimen Description BLOOD LEFT ANTECUBITAL  Final   Special Requests   Final    BOTTLES DRAWN AEROBIC AND ANAEROBIC Blood Culture adequate volume   Culture   Final    NO GROWTH 2 DAYS Performed at [facility], [address] [place], [place] [address]    Report Status PENDING  Incomplete         Radiology Studies: No results found.      Scheduled Meds: . dexamethasone  6 mg Oral Q24H  . enoxaparin (LOVENOX) injection  55 mg Subcutaneous Q24H  . levothyroxine  50 mcg Oral QAC breakfast  . multivitamin with minerals  1 tablet Oral Daily   Continuous Infusions: . azithromycin 250 mg (12/05/18 1834)  . cefTRIAXone (ROCEPHIN)  IV 2 g (12/05/18 1703)  . remdesivir 100 mg in NS 250 mL 100 mg (12/05/18 1549)     LOS: 2 days   Time spent:  Azucena Fallen, DO [facility]  If 7PM-7AM, please contact night-coverage [email or web address] Password [facility] 12/06/2018, 12:26 PM

## 2018-12-06 NOTE — TOC Initial Note (Signed)
Transition of Care [facility]) - Initial/Assessment Note    Patient Details  Name: Nancy [name] MRN: [id number] Date of Birth: [birth year]  Transition of Care [facility]) CM/SW Contact:    Ninfa Meeker, RN Phone Number: [phone] (working remotely) 12/06/2018, 3:46 PM  Clinical Narrative:   63 yr old female admitted for COVID 19 treatment . Patient's daughter has COVID as well. On Remdesivir, steriods, Oxygen at 2 LNC. Case manager will continue to monitor for needs as patient medically improves. May she be blessed to do so.                        Patient Goals and CMS Choice        Expected Discharge Plan and Services                                                Prior Living Arrangements/Services                       Activities of Daily Living Home Assistive Devices/Equipment: Wheelchair ADL Screening (condition at time of admission) Patient's cognitive ability adequate to safely complete daily activities?: Yes Is the patient deaf or have difficulty hearing?: No Does the patient have difficulty seeing, even when wearing glasses/contacts?: No Does the patient have difficulty concentrating, remembering, or making decisions?: No Patient able to express need for assistance with ADLs?: Yes Does the patient have difficulty dressing or bathing?: Yes Independently performs ADLs?: No Communication: Independent Dressing (OT): Needs assistance Is this a change from baseline?: Pre-admission baseline Grooming: Independent Feeding: Independent Bathing: Needs assistance Is this a change from baseline?: Pre-admission baseline Toileting: Needs assistance Is this a change from baseline?: Pre-admission baseline In/Out Bed: Needs assistance Is this a change from baseline?: Pre-admission baseline Walks in Home: Dependent Is this a change from baseline?: Pre-admission baseline Does the patient have difficulty walking or climbing stairs?: Yes Weakness of Legs:  Both Weakness of Arms/Hands: Both  Permission Sought/Granted                  Emotional Assessment              Admission diagnosis:  cough and congestion Patient Active Problem List   Diagnosis Date Noted  . Acute respiratory failure with hypoxia ([place]) 12/05/2018  . Pneumonia due to COVID-19 virus 12/04/2018  . AKI (acute kidney injury) ([place]) 12/04/2018  . Viral URI with cough 11/03/2017  . Wheelchair dependence 12/17/2016  . Polyarthralgia 12/02/2016  . Healthcare maintenance 12/18/2014  . GERD (gastroesophageal reflux disease) 04/13/2014  . Allergic rhinitis 01/24/2014  . Severe obesity (BMI >= 40) ([place]) 09/14/2013  . Abnormal CT of brain 11/19/2011  . MACULAR DEGENERATION, BILATERAL 05/22/2008  . Essential hypertension 03/17/2006  . SICCA SYNDROME 03/17/2006  . KNEE PAIN, CHRONIC 03/17/2006  . Acute exacerbation of chronic low back pain 03/17/2006  . Obstructive sleep apnea 09/07/2003  . Hypothyroidism 09/07/1998  . ANEMIA, IRON DEFICIENCY NOS 09/07/1998   PCP:  Aldine Contes, MD Pharmacy:   [place] ([place]), [place] - [address] [place] ([place]) [place] [address] Phone: [phone] Fax: [phone]     Social Determinants of Health (SDOH) Interventions    Readmission Risk Interventions No flowsheet data found.

## 2018-12-06 NOTE — Progress Notes (Signed)
Called and spoke with pts Spouse Mortimer Fries, informed him of pts condition and plan of care, will keep monitoring pt for changes

## 2018-12-06 NOTE — Plan of Care (Signed)
OOB to chair this shift. SOB/dizziness with exertion. Otherwise no changes and stable. Fall precautions maintained. Call light in reach.  Problem: Education: Goal: Knowledge of risk factors and measures for prevention of condition will improve Outcome: Progressing   Problem: Coping: Goal: Psychosocial and spiritual needs will be supported Outcome: Progressing   Problem: Respiratory: Goal: Will maintain a patent airway Outcome: Progressing Goal: Complications related to the disease process, condition or treatment will be avoided or minimized Outcome: Progressing

## 2018-12-07 DIAGNOSIS — J9601 Acute respiratory failure with hypoxia: Secondary | ICD-10-CM

## 2018-12-07 LAB — COMPREHENSIVE METABOLIC PANEL WITH GFR
ALT: 11 U/L (ref 0–44)
AST: 25 U/L (ref 15–41)
Albumin: 2.8 g/dL — ABNORMAL LOW (ref 3.5–5.0)
Alkaline Phosphatase: 56 U/L (ref 38–126)
Anion gap: 8 (ref 5–15)
BUN: 38 mg/dL — ABNORMAL HIGH (ref 8–23)
CO2: 27 mmol/L (ref 22–32)
Calcium: 7.9 mg/dL — ABNORMAL LOW (ref 8.9–10.3)
Chloride: 97 mmol/L — ABNORMAL LOW (ref 98–111)
Creatinine, Ser: 0.97 mg/dL (ref 0.44–1.00)
GFR calc Af Amer: >60 mL/min
GFR calc non Af Amer: >60 mL/min
Glucose, Bld: 108 mg/dL — ABNORMAL HIGH (ref 70–99)
Potassium: 4.3 mmol/L (ref 3.5–5.1)
Sodium: 132 mmol/L — ABNORMAL LOW (ref 135–145)
Total Bilirubin: 0.5 mg/dL (ref 0.3–1.2)
Total Protein: 7 g/dL (ref 6.5–8.1)

## 2018-12-07 LAB — CBC
HCT: 29.6 % — ABNORMAL LOW (ref 36.0–46.0)
Hemoglobin: 9.3 g/dL — ABNORMAL LOW (ref 12.0–15.0)
MCH: 24.1 pg — ABNORMAL LOW (ref 26.0–34.0)
MCHC: 31.4 g/dL (ref 30.0–36.0)
MCV: 76.7 fL — ABNORMAL LOW (ref 80.0–100.0)
Platelets: 334 10*3/uL (ref 150–400)
RBC: 3.86 MIL/uL — ABNORMAL LOW (ref 3.87–5.11)
RDW: 14.7 % (ref 11.5–15.5)
WBC: 13.8 10*3/uL — ABNORMAL HIGH (ref 4.0–10.5)
nRBC: 0 % (ref 0.0–0.2)

## 2018-12-07 LAB — C-REACTIVE PROTEIN: CRP: 12.2 mg/dL — ABNORMAL HIGH

## 2018-12-07 LAB — FERRITIN: Ferritin: 415 ng/mL — ABNORMAL HIGH (ref 11–307)

## 2018-12-07 LAB — D-DIMER, QUANTITATIVE: D-Dimer, Quant: 0.77 ug{FEU}/mL — ABNORMAL HIGH (ref 0.00–0.50)

## 2018-12-07 MED ORDER — INFLUENZA VAC A&B SA ADJ QUAD 0.5 ML IM PRSY
0.5000 mL | PREFILLED_SYRINGE | INTRAMUSCULAR | Status: AC
  Administered 2018-12-08: 11:00:00 0.5 mL via INTRAMUSCULAR
  Filled 2018-12-07: qty 0.5

## 2018-12-07 MED ORDER — PNEUMOCOCCAL VAC POLYVALENT 25 MCG/0.5ML IJ INJ
0.5000 mL | INJECTION | INTRAMUSCULAR | Status: AC
  Administered 2018-12-08: 0.5 mL via INTRAMUSCULAR
  Filled 2018-12-07: qty 0.5

## 2018-12-07 NOTE — Plan of Care (Signed)
Vital signs are stable and pt is tolerating O2 weaning. Will continue to wean O2 and monitor.

## 2018-12-07 NOTE — Progress Notes (Signed)
Called and updated pts Spouse Shirlee Whitmire) updated him on pts condition and plan of care, will keep monitoring pt for changes

## 2018-12-07 NOTE — Evaluation (Signed)
Physical Therapy Evaluation Patient Details Name: Nancy [name] MRN: [id number] DOB: Apr 04, 1955 Today's Date: 12/07/2018   History of Present Illness  63 y/o female admitted with COVID, dtr also COVID + Pt w/ hx of obesity, chronic low back pain, BLE edema, polyarthralgia, Sjogren's syndome, HTN, macular degeneration, presented with acute resp failure w/ hypoxia, HTN, Ostructive sleep apnea, AKI.  Clinical Impression   Pt admitted with above diagnosis. Pt currently with functional limitations due to the deficits listed below (see PT Problem List). Pt will benefit from skilled PT to increase their activity tolerance, strength, independence and safety with mobility to allow discharge to the venue listed below.       Follow Up Recommendations Home health PT    Equipment Recommendations  None recommended by PT    Recommendations for Other Services       Precautions / Restrictions Precautions Precautions: Fall Restrictions Weight Bearing Restrictions: No      Mobility  Bed Mobility Overal bed mobility: Needs Assistance Bed Mobility: Rolling;Sidelying to Sit;Supine to Sit Rolling: Supervision Sidelying to sit: Supervision Supine to sit: Supervision     General bed mobility comments: uses bed fixtures but at home just has king size bed no rails etc  Transfers Overall transfer level: Needs assistance Equipment used: Rolling walker (2 wheeled) Transfers: Sit to/from Stand Sit to Stand: Min guard         General transfer comment: Pt is able to complete sit<>stand from recliner and take few steps to bed with CGA and cues  Ambulation/Gait Ambulation/Gait assistance: Min guard Gait Distance (Feet): 5 Feet Assistive device: Rolling walker (2 wheeled)       General Gait Details: states is able to ambulate short distances, will continue to address this and progress as she tolerates  Stairs            Wheelchair Mobility    Modified Rankin (Stroke Patients Only)       Balance Overall balance assessment: Needs assistance Sitting-balance support: Feet supported;Bilateral upper extremity supported Sitting balance-Leahy Scale: Fair   Postural control: (fair does not lean) Standing balance support: Bilateral upper extremity supported Standing balance-Leahy Scale: Fair Standing balance comment: stands with RW and SBA-CGA                             Pertinent Vitals/Pain Pain Assessment: Faces Faces Pain Scale: Hurts little more Pain Location: in chest with deep breathing Pain Intervention(s): Limited activity within patient's tolerance    Home Living Family/patient expects to be discharged to:: Private residence Living Arrangements: Spouse/significant other Available Help at Discharge: Family Type of Home: House Home Access: Ramped entrance     Home Layout: One level Home Equipment: Environmental consultant - 2 wheels;Wheelchair - manual Additional Comments: Pt uses w/c at home but is not bed bound nor w/c bound, she is able to ambulate short distances a per own reporting    Prior Function Level of Independence: Independent with assistive device(s)         Comments: mostly does everything on her own at w/c level, spouse assists as needed     Hand Dominance        Extremity/Trunk Assessment        Lower Extremity Assessment Lower Extremity Assessment: Overall WFL for tasks assessed       Communication   Communication: No difficulties  Cognition Arousal/Alertness: Awake/alert Behavior During Therapy: WFL for tasks assessed/performed Overall Cognitive Status: Within Functional Limits for  tasks assessed                                        General Comments General comments (skin integrity, edema, etc.): Overall pt is doing well with mobility, she is able to get to and from bed/recliner with CGA-SBA and RW, she states she uses w/c at home but not exclusively. She was able to complete the assessment on 2L/min on  [place] monitored through pedi Nelcor finger probe and sats renage from 91%- 84% with cues she is able to regain oxygenation to high 80s.    Exercises     Assessment/Plan    PT Assessment Patient needs continued PT services  PT Problem List Decreased strength;Decreased activity tolerance;Decreased balance;Decreased mobility;Decreased coordination;Decreased safety awareness       PT Treatment Interventions Gait training;Functional mobility training;Therapeutic activities;Therapeutic exercise;Balance training;Neuromuscular re-education;Wheelchair mobility training;Patient/family education    PT Goals (Current goals can be found in the Care Plan section)  Acute Rehab PT Goals Patient Stated Goal: return home PT Goal Formulation: With patient Time For Goal Achievement: 12/21/18 Potential to Achieve Goals: Good    Frequency Min 3X/week   Barriers to discharge        Co-evaluation               AM-PAC PT "6 Clicks" Mobility  Outcome Measure Help needed turning from your back to your side while in a flat bed without using bedrails?: None Help needed moving from lying on your back to sitting on the side of a flat bed without using bedrails?: None Help needed moving to and from a bed to a chair (including a wheelchair)?: A Little Help needed standing up from a chair using your arms (e.g., wheelchair or bedside chair)?: A Little Help needed to walk in hospital room?: A Little Help needed climbing 3-5 steps with a railing? : A Lot 6 Click Score: 19    End of Session Equipment Utilized During Treatment: Oxygen(RW) Activity Tolerance: Treatment limited secondary to medical complications (Comment);Patient limited by lethargy(noted some coughing also) Patient left: in bed;with call bell/phone within reach   PT Visit Diagnosis: Other abnormalities of gait and mobility (R26.89);Muscle weakness (generalized) (M62.81);History of falling (Z91.81)    Time: 4481-8563 PT Time Calculation  (min) (ACUTE ONLY): 23 min   Charges:     PT Treatments $Therapeutic Activity: 8-22 mins        Horald Chestnut, PT   Delford Field 12/07/2018, 5:29 PM

## 2018-12-07 NOTE — Plan of Care (Signed)
Pt educated on activity an breathing techniques, will keep monitoring pt

## 2018-12-07 NOTE — Progress Notes (Signed)
PROGRESS NOTE  Nancy [name]  QVZ:[id number] DOB: [birth year] DOA: 12/04/2018 PCP: Earl Lagos, MD  Brief Narrative: Nancy [name] a 63 y.o.femalewith medical history significant ofobesity, hypertension, macular degeneration whose daughter has been covidpositive and is a source of exposure. She reports a 2-week history of persistent cough and has had progressively increasing shortness of breath.Because of her increasing shortness of breath and rapid respirations she comes tothe emergency department for further evaluation. Patient is uncomfortable but not in distress. Sheis pain-free.In ED patient is hemodynamically stable, respiratory rates in the high 20s, O2 saturations are in the low 90s. Rapid COVID screen was positive. White blood count is normal. Inflammatory markers are positive with an elevated LDH, elevated ferritin, elevated CRP at 24.4, positive d-dimer at 1.46, fibrinogen greater than 800. CXR revealed patchy groundglass infiltrates right greater than left. AKI w/ Cr 1.9 since improved with IVF.  Assessment & Plan: Principal Problem:   Pneumonia due to COVID-19 virus Active Problems:   Hypothyroidism   Essential hypertension   Obstructive sleep apnea   AKI (acute kidney injury) (HCC)   Acute respiratory failure with hypoxia (HCC)   Acute hypoxic respiratory failure due to covid-19 pneumonia:  - Complete 5 days remdesivir - Complete 10 days steroids - Continue vitamin C and zinc - Wean O2 as tolerated.  - OOB, IS, proning. - PCT also elevated with asymmetric opacities, cannot r/o CAP, so will complete 5 days abx as well.  AKI: Improving off IVF.  - Monitor in AM, avoid nephrotoxins  Hyponatremia: Hypovolemic.   HTN:  - Continue current management  OSA:  - CPAP at home, O2 here.   Hypothyroidism: TSH 2.327 at last check.  - Continue synthroid.   Obesity: BMI 44  DVT prophylaxis: Weight-based lovenox Code Status: Full Family  Communication: None at bedside Disposition Plan: 10/1 if stable following final abx/remdesivir  Consultants:   None  Procedures:   None  Antimicrobials:  Ceftriaxone, azithromycin, remdesivir 9/27 - 10/1.    Subjective: Breathing better, still moderately SOB with exertion, no chest pain. +Cough, no fever.   Objective: Vitals:   12/07/18 1212 12/07/18 1342 12/07/18 1344 12/07/18 1523  BP:    (!) 134/55  Pulse:    72  Resp:    20  Temp:    98 F (36.7 C)  TempSrc:    Oral  SpO2: 94% (!) 87% 94% 95%  Weight:      Height:        Intake/Output Summary (Last 24 hours) at 12/07/2018 1717 Last data filed at 12/07/2018 1620 Gross per 24 hour  Intake 2073.15 ml  Output 1260 ml  Net 813.15 ml   Filed Weights   12/04/18 1147  Weight: 113.4 kg    Gen: 63 y.o. female in no distress  Pulm: Non-labored breathing supplemental oxygen. Crackles at bases bilaterally.  CV: Regular rate and rhythm. No murmur, rub, or gallop. No JVD, no pedal edema. GI: Abdomen soft, non-tender, non-distended, with normoactive bowel sounds. No organomegaly or masses felt. Ext: Warm, no deformities Skin: No rashes, lesions no ulcers Neuro: Alert and oriented. No focal neurological deficits. Psych: Judgement and insight appear normal. Mood & affect appropriate.   Data Reviewed: I have personally reviewed following labs and imaging studies  CBC: Recent Labs  Lab 12/04/18 1221 12/05/18 0230 12/06/18 0240 12/07/18 0425  WBC 7.5 10.0 17.2* 13.8*  NEUTROABS 6.1  --   --   --   HGB 9.6* 9.0* 8.8* 9.3*  HCT 30.8*  29.4* 28.6* 29.6*  MCV 77.8* 78.0* 78.1* 76.7*  PLT 284 202 253 334   Basic Metabolic Panel: Recent Labs  Lab 12/04/18 1221 12/05/18 0230 12/06/18 0240 12/07/18 0425  NA 128* 130* 129* 132*  K 3.7 4.0 4.2 4.3  CL 89* 95* 93* 97*  CO2 24 22 25 27   GLUCOSE 102* 154* 120* 108*  BUN 21 29* 37* 38*  CREATININE 1.92* 1.50* 1.34* 0.97  CALCIUM 8.2* 7.5* 7.6* 7.9*   GFR: Estimated  Creatinine Clearance: 70.1 mL/min (by C-G formula based on SCr of 0.97 mg/dL). Liver Function Tests: Recent Labs  Lab 12/04/18 1221 12/05/18 0230 12/06/18 0240 12/07/18 0425  AST 37 31 27 25   ALT 13 12 12 11   ALKPHOS 53 54 54 56  BILITOT 1.5* 0.6 0.4 0.5  PROT 8.0 7.3 7.4 7.0  ALBUMIN 3.3* 3.0* 3.0* 2.8*   No results for input(s): LIPASE, AMYLASE in the last 168 hours. No results for input(s): AMMONIA in the last 168 hours. Coagulation Profile: No results for input(s): INR, PROTIME in the last 168 hours. Cardiac Enzymes: No results for input(s): CKTOTAL, CKMB, CKMBINDEX, TROPONINI in the last 168 hours. BNP (last 3 results) No results for input(s): PROBNP in the last 8760 hours. HbA1C: No results for input(s): HGBA1C in the last 72 hours. CBG: No results for input(s): GLUCAP in the last 168 hours. Lipid Profile: No results for input(s): CHOL, HDL, LDLCALC, TRIG, CHOLHDL, LDLDIRECT in the last 72 hours. Thyroid Function Tests: No results for input(s): TSH, T4TOTAL, FREET4, T3FREE, THYROIDAB in the last 72 hours. Anemia Panel: Recent Labs    12/06/18 0240 12/07/18 0425  FERRITIN 371* 415*   Urine analysis:    Component Value Date/Time   COLORURINE YELLOW 11/14/2014 2150   APPEARANCEUR Clear 11/22/2015 1054   LABSPEC 1.014 11/14/2014 2150   PHURINE 6.5 11/14/2014 2150   GLUCOSEU Negative 11/22/2015 1054   GLUCOSEU NEG mg/dL 16/10/960403/10/2009 [id number]   HGBUR NEGATIVE 11/14/2014 2150   HGBUR negative 07/29/2006 0856   BILIRUBINUR Negative 11/22/2015 1054   KETONESUR NEGATIVE 11/14/2014 2150   PROTEINUR Negative 11/22/2015 1054   PROTEINUR NEGATIVE 11/14/2014 2150   UROBILINOGEN 0.2 11/22/2015 1051   UROBILINOGEN 1.0 11/14/2014 2150   NITRITE Negative 11/22/2015 1054   NITRITE NEGATIVE 11/14/2014 2150   LEUKOCYTESUR 2+ (A) 11/22/2015 1054   Recent Results (from the past 240 hour(s))  SARS Coronavirus 2 [facility](Hospital order, Performed in [facility]) Nasopharyngeal  Nasopharyngeal Swab     Status: Abnormal   Collection Time: 12/04/18 10:50 AM   Specimen: Nasopharyngeal Swab  Result Value Ref Range Status   SARS Coronavirus 2 POSITIVE (A) NEGATIVE Final    Comment: CRITICAL RESULT CALLED TO, READ BACK BY AND VERIFIED WITH: RN DEASHA  1343 [id number] FCP (NOTE) If result is NEGATIVE SARS-CoV-2 target nucleic acids are NOT DETECTED. The SARS-CoV-2 RNA is generally detectable in upper and lower  respiratory specimens during the acute phase of infection. The lowest  concentration of SARS-CoV-2 viral copies this assay can detect is 250  copies / mL. A negative result does not preclude SARS-CoV-2 infection  and should not be used as the sole basis for treatment or other  patient management decisions.  A negative result may occur with  improper specimen collection / handling, submission of specimen other  than nasopharyngeal swab, presence of viral mutation(s) within the  areas targeted by this assay, and inadequate number of viral copies  (<250 copies / mL). A negative result must  be combined with clinical  observations, patient history, and epidemiological information. If result is POSITIVE SARS-CoV-2 target nucleic acids are DETECTED. T he SARS-CoV-2 RNA is generally detectable in upper and lower  respiratory specimens during the acute phase of infection.  Positive  results are indicative of active infection with SARS-CoV-2.  Clinical  correlation with patient history and other diagnostic information is  necessary to determine patient infection status.  Positive results do  not rule out bacterial infection or co-infection with other viruses. If result is PRESUMPTIVE POSTIVE SARS-CoV-2 nucleic acids MAY BE PRESENT.   A presumptive positive result was obtained on the submitted specimen  and confirmed on repeat testing.  While 2019 novel coronavirus  (SARS-CoV-2) nucleic acids may be present in the submitted sample  additional confirmatory testing may be  necessary for epidemiological  and / or clinical management purposes  to differentiate between  SARS-CoV-2 and other Sarbecovirus currently known to infect humans.  If clinically indicated additional testing with an alternate test  methodology [phone]) is  advised. The SARS-CoV-2 RNA is generally  detectable in upper and lower respiratory specimens during the acute  phase of infection. The expected result is Negative. Fact Sheet for Patients:  BoilerBrush.com.cy Fact Sheet for Healthcare Providers: https://pope.com/ This test is not yet approved or cleared by the [place] FDA and has been authorized for detection and/or diagnosis of SARS-CoV-2 by FDA under an Emergency Use Authorization (EUA).  This EUA will remain in effect (meaning this test can be used) for the duration of the COVID-19 declaration under Section 564(b)(1) of the Act, 21 U.S.C. section 360bbb-3(b)(1), unless the authorization is terminated or revoked sooner. Performed at [facility], [address], [place], [place] [address]   Blood Culture (routine x 2)     Status: None (Preliminary result)   Collection Time: 12/04/18 12:20 PM   Specimen: BLOOD RIGHT FOREARM  Result Value Ref Range Status   Specimen Description BLOOD RIGHT FOREARM  Final   Special Requests   Final    BOTTLES DRAWN AEROBIC AND ANAEROBIC Blood Culture adequate volume   Culture   Final    NO GROWTH 3 DAYS Performed at [facility], [address] [place], [place] [address]    Report Status PENDING  Incomplete  Blood Culture (routine x 2)     Status: None (Preliminary result)   Collection Time: 12/04/18  1:19 PM   Specimen: BLOOD  Result Value Ref Range Status   Specimen Description BLOOD LEFT ANTECUBITAL  Final   Special Requests   Final    BOTTLES DRAWN AEROBIC AND ANAEROBIC Blood Culture adequate volume   Culture   Final    NO GROWTH 3 DAYS Performed at [facility]  Lab, [address], [place], [place] [address]    Report Status PENDING  Incomplete      Radiology Studies: No results found.  Scheduled Meds: . dexamethasone  6 mg Oral Q24H  . enoxaparin (LOVENOX) injection  55 mg Subcutaneous Q24H  . [START ON 12/08/2018] influenza vaccine adjuvanted  0.5 mL Intramuscular Tomorrow-1000  . levothyroxine  50 mcg Oral QAC breakfast  . multivitamin with minerals  1 tablet Oral Daily  . [START ON 12/08/2018] pneumococcal 23 valent vaccine  0.5 mL Intramuscular Tomorrow-1000   Continuous Infusions: . azithromycin 250 mg (12/07/18 1700)  . cefTRIAXone (ROCEPHIN)  IV 2 g (12/06/18 1651)  . remdesivir 100 mg in NS 250 mL Stopped (12/07/18 1620)  LOS: 3 days   Time spent: 25 minutes.  Patrecia Pour, MD [facility] [email or web address] Password [email or web address] 12/07/2018, 5:17 PM

## 2018-12-07 NOTE — Progress Notes (Signed)
Called pt's husband Mardy Hoppe. Updated him on pt's condition, vital signs, and O2 requirements. Informed him that remdesivir and abx will be completing tomorrow. He declined any further questions.

## 2018-12-07 NOTE — Plan of Care (Signed)
Pt educated on use of O2

## 2018-12-08 ENCOUNTER — Inpatient Hospital Stay (HOSPITAL_COMMUNITY): Payer: Medicare Other

## 2018-12-08 LAB — COMPREHENSIVE METABOLIC PANEL WITH GFR
ALT: 11 U/L (ref 0–44)
AST: 24 U/L (ref 15–41)
Albumin: 2.4 g/dL — ABNORMAL LOW (ref 3.5–5.0)
Alkaline Phosphatase: 58 U/L (ref 38–126)
Anion gap: 5 (ref 5–15)
BUN: 30 mg/dL — ABNORMAL HIGH (ref 8–23)
CO2: 30 mmol/L (ref 22–32)
Calcium: 8.1 mg/dL — ABNORMAL LOW (ref 8.9–10.3)
Chloride: 97 mmol/L — ABNORMAL LOW (ref 98–111)
Creatinine, Ser: 0.94 mg/dL (ref 0.44–1.00)
GFR calc Af Amer: >60 mL/min
GFR calc non Af Amer: >60 mL/min
Glucose, Bld: 133 mg/dL — ABNORMAL HIGH (ref 70–99)
Potassium: 4.3 mmol/L (ref 3.5–5.1)
Sodium: 132 mmol/L — ABNORMAL LOW (ref 135–145)
Total Bilirubin: 0.2 mg/dL — ABNORMAL LOW (ref 0.3–1.2)
Total Protein: 6.7 g/dL (ref 6.5–8.1)

## 2018-12-08 LAB — CBC
HCT: 29.1 % — ABNORMAL LOW (ref 36.0–46.0)
Hemoglobin: 9.4 g/dL — ABNORMAL LOW (ref 12.0–15.0)
MCH: 24.2 pg — ABNORMAL LOW (ref 26.0–34.0)
MCHC: 32.3 g/dL (ref 30.0–36.0)
MCV: 75 fL — ABNORMAL LOW (ref 80.0–100.0)
Platelets: 375 10*3/uL (ref 150–400)
RBC: 3.88 MIL/uL (ref 3.87–5.11)
RDW: 14.3 % (ref 11.5–15.5)
WBC: 10.6 10*3/uL — ABNORMAL HIGH (ref 4.0–10.5)
nRBC: 0.3 % — ABNORMAL HIGH (ref 0.0–0.2)

## 2018-12-08 LAB — C-REACTIVE PROTEIN: CRP: 13.3 mg/dL — ABNORMAL HIGH

## 2018-12-08 LAB — FERRITIN: Ferritin: 376 ng/mL — ABNORMAL HIGH (ref 11–307)

## 2018-12-08 LAB — D-DIMER, QUANTITATIVE: D-Dimer, Quant: 0.77 ug{FEU}/mL — ABNORMAL HIGH (ref 0.00–0.50)

## 2018-12-08 MED ORDER — LACTATED RINGERS IV SOLN
INTRAVENOUS | Status: DC
  Administered 2018-12-08 – 2018-12-09 (×3): via INTRAVENOUS

## 2018-12-08 NOTE — Progress Notes (Signed)
Patient working with PT, reports increased fatigue and drowsiness today.  Dangling on side of bed, Spo2 82-88% on 5L o2 via [place].  MD made aware of increased o2 needs today.  Patient denies shortness of breath, but visibly breathing shallowly on edge of bed.  Encouraged to deep breath, cough, and instructed on use of incentive spirometry and flutter valve. Pulse ox probe site rotated oxygen probe with same result.  When ambulating from bed to chair, standing and pivoting, desaturation noted with Spo2 77%.  Applied high flow nasal cannula set up, titrated to 8L HFNC, spo2 92%.  Will continue to closely monitor.    12/08/18 1350  Vitals  Pulse Rate 77  Pulse Rate Source Monitor  ECG Heart Rate 77  Cardiac Rhythm NSR  Resp 17  Oxygen Therapy  SpO2 (!) 77 %  O2 Device Nasal Cannula  O2 Flow Rate (L/min) 5 L/min  Patient Activity (if Appropriate) Ambulating (transferring from bed to chair)  Pulse Oximetry Type Continuous  MEWS Score  MEWS RR 0  MEWS Pulse 0  MEWS Systolic 0  MEWS LOC 0  MEWS Temp 0  MEWS Score 0  MEWS Score Color Nyoka Cowden

## 2018-12-08 NOTE — Progress Notes (Signed)
Called and updated the spouse Mortimer Fries. I informed him that she will complete remdesivir and abx today. I explained that she may be discharged tomorrow with home O2. He verbalized an understanding and denied any questions or concerns.

## 2018-12-08 NOTE — Progress Notes (Signed)
PROGRESS NOTE  Nancy [name]  ZOX:[id number] DOB: 1955-12-06 DOA: 12/04/2018 PCP: Earl Lagos, MD  Brief Narrative: Nancy Jarvis [name] a 63 y.o.femalewith medical history significant ofobesity, hypertension, macular degeneration whose daughter has been covidpositive and is a source of exposure. She reports a 2-week history of persistent cough and has had progressively increasing shortness of breath.Because of her increasing shortness of breath and rapid respirations she comes tothe emergency department for further evaluation. Patient is uncomfortable but not in distress. Sheis pain-free.In ED patient is hemodynamically stable, respiratory rates in the high 20s, O2 saturations are in the low 90s. Rapid COVID screen was positive. White blood count is normal. Inflammatory markers are positive with an elevated LDH, elevated ferritin, elevated CRP at 24.4, positive d-dimer at 1.46, fibrinogen greater than 800. CXR revealed patchy groundglass infiltrates right greater than left. AKI w/ Cr 1.9 since improved with IVF.  Assessment & Plan: Principal Problem:   Pneumonia due to COVID-19 virus Active Problems:   Hypothyroidism   Essential hypertension   Obstructive sleep apnea   AKI (acute kidney injury) (HCC)   Acute respiratory failure with hypoxia (HCC)   Acute hypoxic respiratory failure due to covid-19 pneumonia: Worsening hypoxia today with CRP bumping 12 > 13.  - Complete 5 days remdesivir this PM - Consider CCP if CRP rising or infiltrates progressing. D/w patient who opts to defer until tomorrow regardless.  - Complete 10 days steroids - Continue vitamin C and zinc - Wean O2 as tolerated.  - OOB, IS, proning. - PCT also elevated with asymmetric opacities, cannot r/o CAP, so will complete 5 days abx as well. Repeat CXR with worsening hypoxia.   AKI: Improved, though BUN:Cr remains >30:1 and pt reports not eating/drinking enough.  - Start low rate LR.  - Monitor in AM,  avoid nephrotoxins  Hyponatremia: Hypovolemic.  - LR as above.  HTN:  - Continue current management  OSA:  - CPAP at home, O2 here.   Hypothyroidism: TSH 2.327 at last check.  - Continue synthroid.   Obesity: BMI 44  DVT prophylaxis: Weight-based lovenox Code Status: Full Family Communication: None at bedside Disposition Plan: Uncertain. Depends on improvement in hypoxia. Possibly 10/2.  Consultants:   None  Procedures:   None  Antimicrobials:  Ceftriaxone, azithromycin, remdesivir 9/27 - 10/1.    Subjective: Feels slightly better than yesterday, but was severely short of breath when getting to bedside chair with assistance. No chest pain or other complaints.   Objective: Vitals:   12/07/18 1945 12/07/18 2024 12/08/18 0400 12/08/18 0741  BP:  114/65 (!) 111/54 133/63  Pulse:  70 65 70  Resp: 20 18 19  (!) 21  Temp:  98.4 F (36.9 C)  98.2 F (36.8 C)  TempSrc:  Oral Oral Oral  SpO2:  92% 93% 93%  Weight:      Height:        Intake/Output Summary (Last 24 hours) at 12/08/2018 0955 Last data filed at 12/08/2018 0000 Gross per 24 hour  Intake 955 ml  Output 750 ml  Net 205 ml   Filed Weights   12/04/18 1147  Weight: 113.4 kg   Gen: 63 y.o. female in no distress Pulm: Nonlabored breathing, borderline tachypneic with supplemental oxygen now up to 4L from 2L. Crackles bilaterally, stable. No wheezes. CV: Regular rate and rhythm. No murmur, rub, or gallop. No JVD, no dependent edema. GI: Abdomen soft, non-tender, non-distended, with normoactive bowel sounds.  Ext: Warm, no deformities Skin: No rashes, lesions or  ulcers on visualized skin. Neuro: Alert and oriented. No focal neurological deficits. Psych: Judgement and insight appear fair. Mood euthymic & affect congruent. Behavior is appropriate.    Data Reviewed: I have personally reviewed following labs and imaging studies  CBC: Recent Labs  Lab 12/04/18 1221 12/05/18 0230 12/06/18 0240 12/07/18  0425 12/08/18 0308  WBC 7.5 10.0 17.2* 13.8* 10.6*  NEUTROABS 6.1  --   --   --   --   HGB 9.6* 9.0* 8.8* 9.3* 9.4*  HCT 30.8* 29.4* 28.6* 29.6* 29.1*  MCV 77.8* 78.0* 78.1* 76.7* 75.0*  PLT 284 202 253 334 375   Basic Metabolic Panel: Recent Labs  Lab 12/04/18 1221 12/05/18 0230 12/06/18 0240 12/07/18 0425 12/08/18 0308  NA 128* 130* 129* 132* 132*  K 3.7 4.0 4.2 4.3 4.3  CL 89* 95* 93* 97* 97*  CO2 24 22 25 27 30   GLUCOSE 102* 154* 120* 108* 133*  BUN 21 29* 37* 38* 30*  CREATININE 1.92* 1.50* 1.34* 0.97 0.94  CALCIUM 8.2* 7.5* 7.6* 7.9* 8.1*   GFR: Estimated Creatinine Clearance: 72.3 mL/min (by C-G formula based on SCr of 0.94 mg/dL). Liver Function Tests: Recent Labs  Lab 12/04/18 1221 12/05/18 0230 12/06/18 0240 12/07/18 0425 12/08/18 0308  AST 37 31 27 25 24   ALT 13 12 12 11 11   ALKPHOS 53 54 54 56 58  BILITOT 1.5* 0.6 0.4 0.5 0.2*  PROT 8.0 7.3 7.4 7.0 6.7  ALBUMIN 3.3* 3.0* 3.0* 2.8* 2.4*   No results for input(s): LIPASE, AMYLASE in the last 168 hours. No results for input(s): AMMONIA in the last 168 hours. Coagulation Profile: No results for input(s): INR, PROTIME in the last 168 hours. Cardiac Enzymes: No results for input(s): CKTOTAL, CKMB, CKMBINDEX, TROPONINI in the last 168 hours. BNP (last 3 results) No results for input(s): PROBNP in the last 8760 hours. HbA1C: No results for input(s): HGBA1C in the last 72 hours. CBG: No results for input(s): GLUCAP in the last 168 hours. Lipid Profile: No results for input(s): CHOL, HDL, LDLCALC, TRIG, CHOLHDL, LDLDIRECT in the last 72 hours. Thyroid Function Tests: No results for input(s): TSH, T4TOTAL, FREET4, T3FREE, THYROIDAB in the last 72 hours. Anemia Panel: Recent Labs    12/07/18 0425 12/08/18 0308  FERRITIN 415* 376*   Urine analysis:    Component Value Date/Time   COLORURINE YELLOW 11/14/2014 2150   APPEARANCEUR Clear 11/22/2015 1054   LABSPEC 1.014 11/14/2014 2150   PHURINE 6.5  11/14/2014 2150   GLUCOSEU Negative 11/22/2015 1054   GLUCOSEU NEG mg/dL 65/78/4696 2952   HGBUR NEGATIVE 11/14/2014 2150   HGBUR negative 07/29/2006 0856   BILIRUBINUR Negative 11/22/2015 1054   KETONESUR NEGATIVE 11/14/2014 2150   PROTEINUR Negative 11/22/2015 1054   PROTEINUR NEGATIVE 11/14/2014 2150   UROBILINOGEN 0.2 11/22/2015 1051   UROBILINOGEN 1.0 11/14/2014 2150   NITRITE Negative 11/22/2015 1054   NITRITE NEGATIVE 11/14/2014 2150   LEUKOCYTESUR 2+ (A) 11/22/2015 1054   Recent Results (from the past 240 hour(s))  SARS Coronavirus 2 [facility] order, Performed in [facility] hospital lab) Nasopharyngeal Nasopharyngeal Swab     Status: Abnormal   Collection Time: 12/04/18 10:50 AM   Specimen: Nasopharyngeal Swab  Result Value Ref Range Status   SARS Coronavirus 2 POSITIVE (A) NEGATIVE Final    Comment: CRITICAL RESULT CALLED TO, READ BACK BY AND VERIFIED WITH: RN DEASHA  1343 [id number] FCP (NOTE) If result is NEGATIVE SARS-CoV-2 target nucleic acids are NOT DETECTED. The SARS-CoV-2  RNA is generally detectable in upper and lower  respiratory specimens during the acute phase of infection. The lowest  concentration of SARS-CoV-2 viral copies this assay can detect is 250  copies / mL. A negative result does not preclude SARS-CoV-2 infection  and should not be used as the sole basis for treatment or other  patient management decisions.  A negative result may occur with  improper specimen collection / handling, submission of specimen other  than nasopharyngeal swab, presence of viral mutation(s) within the  areas targeted by this assay, and inadequate number of viral copies  (<250 copies / mL). A negative result must be combined with clinical  observations, patient history, and epidemiological information. If result is POSITIVE SARS-CoV-2 target nucleic acids are DETECTED. T he SARS-CoV-2 RNA is generally detectable in upper and lower  respiratory specimens during the acute  phase of infection.  Positive  results are indicative of active infection with SARS-CoV-2.  Clinical  correlation with patient history and other diagnostic information is  necessary to determine patient infection status.  Positive results do  not rule out bacterial infection or co-infection with other viruses. If result is PRESUMPTIVE POSTIVE SARS-CoV-2 nucleic acids MAY BE PRESENT.   A presumptive positive result was obtained on the submitted specimen  and confirmed on repeat testing.  While 2019 novel coronavirus  (SARS-CoV-2) nucleic acids may be present in the submitted sample  additional confirmatory testing may be necessary for epidemiological  and / or clinical management purposes  to differentiate between  SARS-CoV-2 and other Sarbecovirus currently known to infect humans.  If clinically indicated additional testing with an alternate test  methodology [phone]) is  advised. The SARS-CoV-2 RNA is generally  detectable in upper and lower respiratory specimens during the acute  phase of infection. The expected result is Negative. Fact Sheet for Patients:  BoilerBrush.com.cy Fact Sheet for Healthcare Providers: https://pope.com/ This test is not yet approved or cleared by the [place] FDA and has been authorized for detection and/or diagnosis of SARS-CoV-2 by FDA under an Emergency Use Authorization (EUA).  This EUA will remain in effect (meaning this test can be used) for the duration of the COVID-19 declaration under Section 564(b)(1) of the Act, 21 U.S.C. section 360bbb-3(b)(1), unless the authorization is terminated or revoked sooner. Performed at [facility], [address] [place], [place] [address]   Blood Culture (routine x 2)     Status: None (Preliminary result)   Collection Time: 12/04/18 12:20 PM   Specimen: BLOOD RIGHT FOREARM  Result Value Ref Range Status   Specimen Description BLOOD RIGHT FOREARM   Final   Special Requests   Final    BOTTLES DRAWN AEROBIC AND ANAEROBIC Blood Culture adequate volume   Culture   Final    NO GROWTH 4 DAYS Performed at [facility], [address] [place], [place] [address]    Report Status PENDING  Incomplete  Blood Culture (routine x 2)     Status: None (Preliminary result)   Collection Time: 12/04/18  1:19 PM   Specimen: BLOOD  Result Value Ref Range Status   Specimen Description BLOOD LEFT ANTECUBITAL  Final   Special Requests   Final    BOTTLES DRAWN AEROBIC AND ANAEROBIC Blood Culture adequate volume   Culture   Final    NO GROWTH 4 DAYS Performed at [facility], [address] [place], [place] [address]    Report Status PENDING  Incomplete  Radiology Studies: No results found.  Scheduled Meds: . dexamethasone  6 mg Oral Q24H  . enoxaparin (LOVENOX) injection  55 mg Subcutaneous Q24H  . influenza vaccine adjuvanted  0.5 mL Intramuscular Tomorrow-1000  . levothyroxine  50 mcg Oral QAC breakfast  . multivitamin with minerals  1 tablet Oral Daily  . pneumococcal 23 valent vaccine  0.5 mL Intramuscular Tomorrow-1000   Continuous Infusions: . azithromycin Stopped (12/07/18 1800)  . cefTRIAXone (ROCEPHIN)  IV Stopped (12/07/18 1842)  . lactated ringers    . remdesivir 100 mg in NS 250 mL Stopped (12/07/18 1620)     LOS: 4 days   Time spent: 35 minutes.  Tyrone Nine, MD [facility] [email or web address] Password [email or web address] 12/08/2018, 9:55 AM

## 2018-12-08 NOTE — Progress Notes (Signed)
Spoke with patients husband Mortimer Fries and provided updates on patient plan of care as well as all questions answered at this time.

## 2018-12-08 NOTE — Discharge Instructions (Signed)

## 2018-12-08 NOTE — Progress Notes (Signed)
Occupational Therapy Evaluation Patient Details Name: Nancy [name] MRN: [id number] DOB: [birth year] Today's Date: 12/08/2018    History of Present Illness 63 y.o. female with medical history significant of obesity, hypertension, macular degeneration whose daughter has been covid positive and is a source of exposure.    Clinical Impression   PTA, pt modified independent with ADLa dn mobility @ w/c or RW level. During session this pm, pt with desat to 77 on 3L with mobility with good pleth using finger probe R dominant hand. O2 ultimately increased to 8L HFNC due to  SpO2 remaining in low 80s. Pt complaining of chest discomfort with deep breathing. Nsg presetn during session and assisted with HFNC set up. Will follow acutely to facilitate safe DC  Home. Recommend HHOT at DC.     Follow Up Recommendations  Home health OT;Supervision - Intermittent    Equipment Recommendations  3 in 1 bedside commode    Recommendations for Other Services       Precautions / Restrictions Precautions Precautions: Fall Restrictions Weight Bearing Restrictions: No      Mobility Bed Mobility Overal bed mobility: Needs Assistance Bed Mobility: Rolling;Sidelying to Sit;Supine to Sit Rolling: Supervision Sidelying to sit: Supervision Supine to sit: Supervision     General bed mobility comments: uses bed fixtures but at home just has king size bed no rails etc  Transfers Overall transfer level: Needs assistance Equipment used: Rolling walker (2 wheeled) Transfers: Sit to/from Stand Sit to Stand: Min guard              Balance Overall balance assessment: Needs assistance Sitting-balance support: Feet supported;Bilateral upper extremity supported Sitting balance-Leahy Scale: Good   Postural control: (fair does not lean) Standing balance support: Bilateral upper extremity supported Standing balance-Leahy Scale: Fair Standing balance comment: stands with RW and SBA-CGA                            ADL either performed or assessed with clinical judgement   ADL Overall ADL's : Needs assistance/impaired     Grooming: Set up;Sitting   Upper Body Bathing: Set up;Sitting   Lower Body Bathing: Minimal assistance;Sit to/from stand   Upper Body Dressing : Set up;Sitting   Lower Body Dressing: Minimal assistance;Sit to/from stand   Toilet Transfer: Minimal assistance;RW   Toileting- Clothing Manipulation and Hygiene: Minimal assistance;Sit to/from stand       Functional mobility during ADLs: Minimal assistance;Rolling walker General ADL Comments: desat to 34 during ADL     Vision Baseline Vision/History: Macular Degeneration Additional Comments: will further assess; May benefit from referral to Services for the Blind     Perception     Praxis      Pertinent Vitals/Pain Pain Assessment: Faces Faces Pain Scale: Hurts little more Pain Location: in chest with deep breathing Pain Descriptors / Indicators: Aching;Discomfort Pain Intervention(s): Limited activity within patient's tolerance     Hand Dominance Right   Extremity/Trunk Assessment Upper Extremity Assessment Upper Extremity Assessment: Generalized weakness   Lower Extremity Assessment Lower Extremity Assessment: Defer to PT evaluation   Cervical / Trunk Assessment Cervical / Trunk Assessment: Normal   Communication Communication Communication: No difficulties   Cognition Arousal/Alertness: Awake/alert Behavior During Therapy: WFL for tasks assessed/performed Overall Cognitive Status: Within Functional Limits for tasks assessed  General Comments       Exercises Exercises: Other exercises Other Exercises Other Exercises: incentive spirometer x 10 - ablet o pull @ 750 ml Other Exercises: flutter valve x 10   Shoulder Instructions      Home Living Family/patient expects to be discharged to:: Private residence Living Arrangements:  Spouse/significant other Available Help at Discharge: Family Type of Home: House Home Access: Ramped entrance     Home Layout: One level     Bathroom Shower/Tub: Tub only(converting to walk in shower)   Bathroom Toilet: Standard Bathroom Accessibility: Yes How Accessible: Accessible via walker Home Equipment: [place] - 2 wheels;Wheelchair - manual   Additional Comments: Pt uses w/c at home but is not bed bound nor w/c bound, she is able to ambulate short distances a per own reporting      Prior Functioning/Environment Level of Independence: Independent with assistive device(s)        Comments: mostly does everything on her own at w/c level, spouse assists as needed        OT Problem List: Decreased strength;Decreased activity tolerance;Decreased safety awareness;Decreased knowledge of use of DME or AE;Cardiopulmonary status limiting activity;Obesity;Pain      OT Treatment/Interventions: Self-care/ADL training;Therapeutic exercise;Neuromuscular education;Energy conservation;DME and/or AE instruction;Therapeutic activities;Patient/family education;Other (comment)    OT Goals(Current goals can be found in the care plan section) Acute Rehab OT Goals Patient Stated Goal: return home OT Goal Formulation: With patient Time For Goal Achievement: 12/22/18 Potential to Achieve Goals: Good  OT Frequency: Min 3X/week   Barriers to D/C:            Co-evaluation              AM-PAC OT "6 Clicks" Daily Activity     Outcome Measure Help from another person eating meals?: None Help from another person taking care of personal grooming?: A Little Help from another person toileting, which includes using toliet, bedpan, or urinal?: A Little Help from another person bathing (including washing, rinsing, drying)?: A Little Help from another person to put on and taking off regular upper body clothing?: A Little Help from another person to put on and taking off regular lower body  clothing?: A Little 6 Click Score: 19   End of Session Equipment Utilized During Treatment: Oxygen(8L) Nurse Communication: Mobility status;Other (comment)(increased O2 demand)  Activity Tolerance: Patient tolerated treatment well Patient left: in chair;with call bell/phone within reach  OT Visit Diagnosis: Unsteadiness on feet (R26.81);Muscle weakness (generalized) (M62.81);Pain;Low vision, both eyes (H54.2) Pain - part of body: (chest)                Time: 1319-1400 OT Time Calculation (min): 41 min Charges:  OT General Charges $OT Visit: 1 Visit OT Evaluation $OT Eval Moderate Complexity: 1 Mod OT Treatments $Self Care/Home Management : 23-37 mins  Maurie Boettcher, OT/L   Acute OT Clinical Specialist [place] Pager [phone] Office [phone]   [facility] 12/08/2018, 5:11 PM

## 2018-12-09 LAB — COMPREHENSIVE METABOLIC PANEL WITH GFR
ALT: 13 U/L (ref 0–44)
AST: 24 U/L (ref 15–41)
Albumin: 2.5 g/dL — ABNORMAL LOW (ref 3.5–5.0)
Alkaline Phosphatase: 58 U/L (ref 38–126)
Anion gap: 10 (ref 5–15)
BUN: 24 mg/dL — ABNORMAL HIGH (ref 8–23)
CO2: 28 mmol/L (ref 22–32)
Calcium: 8.3 mg/dL — ABNORMAL LOW (ref 8.9–10.3)
Chloride: 97 mmol/L — ABNORMAL LOW (ref 98–111)
Creatinine, Ser: 0.89 mg/dL (ref 0.44–1.00)
GFR calc Af Amer: >60 mL/min
GFR calc non Af Amer: >60 mL/min
Glucose, Bld: 143 mg/dL — ABNORMAL HIGH (ref 70–99)
Potassium: 4.2 mmol/L (ref 3.5–5.1)
Sodium: 135 mmol/L (ref 135–145)
Total Bilirubin: 0.3 mg/dL (ref 0.3–1.2)
Total Protein: 6.9 g/dL (ref 6.5–8.1)

## 2018-12-09 LAB — CULTURE, BLOOD (ROUTINE X 2)
Culture: NO GROWTH
Culture: NO GROWTH
Special Requests: ADEQUATE
Special Requests: ADEQUATE

## 2018-12-09 LAB — CBC
HCT: 29.3 % — ABNORMAL LOW (ref 36.0–46.0)
Hemoglobin: 9.4 g/dL — ABNORMAL LOW (ref 12.0–15.0)
MCH: 24 pg — ABNORMAL LOW (ref 26.0–34.0)
MCHC: 32.1 g/dL (ref 30.0–36.0)
MCV: 74.9 fL — ABNORMAL LOW (ref 80.0–100.0)
Platelets: 448 10*3/uL — ABNORMAL HIGH (ref 150–400)
RBC: 3.91 MIL/uL (ref 3.87–5.11)
RDW: 14.5 % (ref 11.5–15.5)
WBC: 12 10*3/uL — ABNORMAL HIGH (ref 4.0–10.5)
nRBC: 0.2 % (ref 0.0–0.2)

## 2018-12-09 LAB — EXPECTORATED SPUTUM ASSESSMENT W GRAM STAIN, RFLX TO RESP C

## 2018-12-09 LAB — C-REACTIVE PROTEIN: CRP: 14 mg/dL — ABNORMAL HIGH

## 2018-12-09 LAB — D-DIMER, QUANTITATIVE: D-Dimer, Quant: 1.45 ug{FEU}/mL — ABNORMAL HIGH (ref 0.00–0.50)

## 2018-12-09 LAB — FERRITIN: Ferritin: 346 ng/mL — ABNORMAL HIGH (ref 11–307)

## 2018-12-09 NOTE — Progress Notes (Signed)
Physical Therapy Treatment Patient Details Name: Nancy [name] MRN: [id number] DOB: Aug 31, 1955 Today's Date: 12/09/2018    History of Present Illness 63 y.o. female with medical history significant of obesity, hypertension, macular degeneration whose daughter has been covid positive and is a source of exposure.     PT Comments    Worked on strengthening BLE and also increasing activity tolerance with standing ther ex. Pt tolerated tx fairly well did desat to high 70s on 4L/min via HFNC but was able to recover with rest and pursed lip breathing. Monitored sats on Nelcor forehead probe vs pedi finger probe and  Found that forehead probe picks up on changes in sats quicker than finger probe but both will get to same numbers within minutes of each other.    Follow Up Recommendations  Home health PT     Equipment Recommendations  None recommended by PT    Recommendations for Other Services       Precautions / Restrictions Precautions Precautions: Fall;Other (comment)(monitor 02 sats) Restrictions Weight Bearing Restrictions: No    Mobility  Bed Mobility               General bed mobility comments: pt received sitting in recliner  Transfers Overall transfer level: Needs assistance Equipment used: Rolling walker (2 wheeled) Transfers: Sit to/from Stand Sit to Stand: Supervision         General transfer comment: sit<>stand from recliner  Ambulation/Gait                 Stairs             Wheelchair Mobility    Modified Rankin (Stroke Patients Only)       Balance     Sitting balance-Leahy Scale: Good     Standing balance support: Bilateral upper extremity supported Standing balance-Leahy Scale: Fair                              Cognition Arousal/Alertness: Awake/alert Behavior During Therapy: WFL for tasks assessed/performed Overall Cognitive Status: Within Functional Limits for tasks assessed                                        Exercises Other Exercises Other Exercises: marching in place x 20 B x 2 w/ seated rest break    General Comments General comments (skin integrity, edema, etc.): worked on standing ther ex w/ RW, watched 02 sats on Nelcor monitor w/ forehead probe vs tele monitor with pedi proble on finger, noted Nelcor monitor picks up change in saturation faster but tele monitor eventually gets to same levels (just takes increased time to read). with standing and marching in place x 20 each side desat to 77% 02 at 4L/min on HFNC but was able to regain 02 sats to 90s in 48mins.      Pertinent Vitals/Pain Pain Assessment: No/denies pain    Home Living                      Prior Function            PT Goals (current goals can now be found in the care plan section) Acute Rehab PT Goals Patient Stated Goal: return home Time For Goal Achievement: 12/21/18 Potential to Achieve Goals: Good    Frequency    Min 3X/week  PT Plan      Co-evaluation              AM-PAC PT "6 Clicks" Mobility   Outcome Measure  Help needed turning from your back to your side while in a flat bed without using bedrails?: None Help needed moving from lying on your back to sitting on the side of a flat bed without using bedrails?: None Help needed moving to and from a bed to a chair (including a wheelchair)?: A Little Help needed standing up from a chair using your arms (e.g., wheelchair or bedside chair)?: A Little Help needed to walk in hospital room?: A Little Help needed climbing 3-5 steps with a railing? : A Lot 6 Click Score: 19    End of Session Equipment Utilized During Treatment: Oxygen(RW) Activity Tolerance: Treatment limited secondary to medical complications (Comment) Patient left: in chair;with call bell/phone within reach   PT Visit Diagnosis: Other abnormalities of gait and mobility (R26.89);Muscle weakness (generalized) (M62.81);History of falling  (Z91.81)     Time: 5852-7782 PT Time Calculation (min) (ACUTE ONLY): 23 min  Charges:  $Therapeutic Activity: 8-22 mins $Self Care/Home Management: 8-22                     Drema Pry, PT    Freddi Starr 12/09/2018, 3:52 PM

## 2018-12-09 NOTE — Progress Notes (Signed)
Called and spoke with patient's spouse, Mortimer Fries [phone].  Provided updates on current oxygen needs, plan of care, and today's lab results.  Nursing encouraged and answered questions.  Spouse verbalized understanding and voiced thankfulness and appreciation for phone call.

## 2018-12-09 NOTE — TOC Progression Note (Signed)
Transition of Care [facility]) - Progression Note    Patient Details  Name: Nancy [name] MRN: [id number] Date of Birth: December 18, 1955  Transition of Care [facility]) CM/SW Contact  Loletha Grayer Beverely Pace, RN Phone Number: [phone] (working remotely) 12/09/2018, 1:42 PM  Clinical Narrative:  63 yr old female admitted from home for COVID 19 treatment. Case manager attempted to speak with patient via telephone, she is very SOB, CM asked if it would be alright to contact her husband to conserve her enery. Patient agreed. CM called patient's husband, Nancy [name] [phone] and discussed need for home health therapies when patient is ready for discharge. Choice for Home Health agency was offered, Mr.Rayman said his wife has walker and wheelchair already at the home. Referral for Home Health therapy was called to [facility] Liaison. Case manager will continue to monitor for oxygen needs as patient medically improves. May she be blessed to do so.    Expected Discharge Plan: [place] Barriers to Discharge: Continued Medical Work up  Expected Discharge Plan and Services Expected Discharge Plan: [place]   Discharge Planning Services: CM Consult Post Acute Care Choice: [place] arrangements for the past 2 months: Single Family Home Expected Discharge Date: 12/08/18                             Date HH Agency Contacted: 12/09/18 Time [place]: 1341 Representative spoke with at [place]: [place] ([place]) Interventions    Readmission Risk Interventions No flowsheet data found.

## 2018-12-09 NOTE — Progress Notes (Signed)
PROGRESS NOTE  Nancy [name]  HYQ:[id number] DOB: [birth year] DOA: 12/04/2018 PCP: Earl Lagos, MD  Brief Narrative: Nancy [name] [name] a 63 y.o.femalewith medical history significant ofobesity, hypertension, macular degeneration whose daughter has been covidpositive and is a source of exposure. She reports a 2-week history of persistent cough and has had progressively increasing shortness of breath.Because of her increasing shortness of breath and rapid respirations she comes tothe emergency department for further evaluation. Patient is uncomfortable but not in distress. Sheis pain-free.In ED patient is hemodynamically stable, respiratory rates in the high 20s, O2 saturations are in the low 90s. Rapid COVID screen was positive. White blood count is normal. Inflammatory markers are positive with an elevated LDH, elevated ferritin, elevated CRP at 24.4, positive d-dimer at 1.46, fibrinogen greater than 800. CXR revealed patchy groundglass infiltrates right greater than left. AKI w/ Cr 1.9 since improved with IVF.  Assessment & Plan: Principal Problem:   Pneumonia due to COVID-19 virus Active Problems:   Hypothyroidism   Essential hypertension   Obstructive sleep apnea   AKI (acute kidney injury) (HCC)   Acute respiratory failure with hypoxia (HCC)   Acute hypoxic respiratory failure due to covid-19 pneumonia, possible superimposed bacterial PNA: Worsening hypoxia beginning 10/1 with CRP bumping 12 > 13 > 14. CXR on my personal review is essentially stable with bilateral infiltrates 10/1, agree with report that RUL opacity may be improving.  - Completed 5 days remdesivir  - Consider CCP if no improvement in hypoxia over next 24 hours. Will also repeat CXR and check PCT in addition to inflammatory markers in AM. - Completed 5 days antibiotics as well. - Check sputum Cx. - Complete 10 days steroids - Continue vitamin C and zinc - Wean O2 as tolerated. Up to 5L HFNC. - OOB,  IS, proning.  AKI: Improved, though BUN:Cr remains >20:1.  - Continue LR @75cc /hr.  - Monitor in AM, avoid nephrotoxins  Hyponatremia: Hypovolemic.  - Improved with LR as above.  HTN:  - Continue current management  OSA:  - CPAP at home, O2 here.   Hypothyroidism: TSH 2.327 at last check.  - Continue synthroid.   Obesity: BMI 44  DVT prophylaxis: Weight-based lovenox Code Status: Full Family Communication: None at bedside Disposition Plan: Uncertain, trends in labs and hypoxia indicate need for further work up and Tx prior to discharge.   Consultants:   None  Procedures:   None  Antimicrobials:  Ceftriaxone, azithromycin, remdesivir 9/27 - 10/1.    Subjective: Reports feeling no more short of breath over the past 24-48 hours but has needed much more oxygen especially with ambulation. Cough is starting to be more productive. No other complaints.   Objective: Vitals:   12/08/18 2000 12/09/18 0600 12/09/18 0605 12/09/18 0720  BP:   112/64 120/61  Pulse: 77  76 67  Resp:    20  Temp:  98.1 F (36.7 C)  99 F (37.2 C)  TempSrc:  Oral  Axillary  SpO2: 97%  93% 94%  Weight:      Height:        Intake/Output Summary (Last 24 hours) at 12/09/2018 1059 Last data filed at 12/09/2018 0900 Gross per 24 hour  Intake 1699.1 ml  Output 1725 ml  Net -25.9 ml   Filed Weights   12/04/18 1147  Weight: 113.4 kg   Gen: 63 y.o. female in no distress Pulm: Nonlabored breathing while sleeping in chair, 95% on 5L HFNC. Crackles diffusely  CV: Regular rate and rhythm.  No murmur, rub, or gallop. No JVD, no dependent edema. GI: Abdomen soft, non-tender, non-distended, with normoactive bowel sounds.  Ext: Warm, no deformities Skin: No rashes, lesions or ulcers on visualized skin. Neuro: Alert and oriented. No focal neurological deficits. Psych: Judgement and insight appear fair. Mood euthymic & affect congruent. Behavior is appropriate.    Data Reviewed: I have personally  reviewed following labs and imaging studies  CBC: Recent Labs  Lab 12/04/18 1221 12/05/18 0230 12/06/18 0240 12/07/18 0425 12/08/18 0308 12/09/18 0213  WBC 7.5 10.0 17.2* 13.8* 10.6* 12.0*  NEUTROABS 6.1  --   --   --   --   --   HGB 9.6* 9.0* 8.8* 9.3* 9.4* 9.4*  HCT 30.8* 29.4* 28.6* 29.6* 29.1* 29.3*  MCV 77.8* 78.0* 78.1* 76.7* 75.0* 74.9*  PLT 284 202 253 334 375 448*   Basic Metabolic Panel: Recent Labs  Lab 12/05/18 0230 12/06/18 0240 12/07/18 0425 12/08/18 0308 12/09/18 0213  NA 130* 129* 132* 132* 135  K 4.0 4.2 4.3 4.3 4.2  CL 95* 93* 97* 97* 97*  CO2 22 25 27 30 28   GLUCOSE 154* 120* 108* 133* 143*  BUN 29* 37* 38* 30* 24*  CREATININE 1.50* 1.34* 0.97 0.94 0.89  CALCIUM 7.5* 7.6* 7.9* 8.1* 8.3*   GFR: Estimated Creatinine Clearance: 76.4 mL/min (by C-G formula based on SCr of 0.89 mg/dL). Liver Function Tests: Recent Labs  Lab 12/05/18 0230 12/06/18 0240 12/07/18 0425 12/08/18 0308 12/09/18 0213  AST 31 27 25 24 24   ALT 12 12 11 11 13   ALKPHOS 54 54 56 58 58  BILITOT 0.6 0.4 0.5 0.2* 0.3  PROT 7.3 7.4 7.0 6.7 6.9  ALBUMIN 3.0* 3.0* 2.8* 2.4* 2.5*   No results for input(s): LIPASE, AMYLASE in the last 168 hours. No results for input(s): AMMONIA in the last 168 hours. Coagulation Profile: No results for input(s): INR, PROTIME in the last 168 hours. Cardiac Enzymes: No results for input(s): CKTOTAL, CKMB, CKMBINDEX, TROPONINI in the last 168 hours. BNP (last 3 results) No results for input(s): PROBNP in the last 8760 hours. HbA1C: No results for input(s): HGBA1C in the last 72 hours. CBG: No results for input(s): GLUCAP in the last 168 hours. Lipid Profile: No results for input(s): CHOL, HDL, LDLCALC, TRIG, CHOLHDL, LDLDIRECT in the last 72 hours. Thyroid Function Tests: No results for input(s): TSH, T4TOTAL, FREET4, T3FREE, THYROIDAB in the last 72 hours. Anemia Panel: Recent Labs    12/08/18 0308 12/09/18 0213  FERRITIN 376* 346*    Urine analysis:    Component Value Date/Time   COLORURINE YELLOW 11/14/2014 2150   APPEARANCEUR Clear 11/22/2015 1054   LABSPEC 1.014 11/14/2014 2150   PHURINE 6.5 11/14/2014 2150   GLUCOSEU Negative 11/22/2015 1054   GLUCOSEU NEG mg/dL 62/95/2841 3244   HGBUR NEGATIVE 11/14/2014 2150   HGBUR negative 07/29/2006 0856   BILIRUBINUR Negative 11/22/2015 1054   KETONESUR NEGATIVE 11/14/2014 2150   PROTEINUR Negative 11/22/2015 1054   PROTEINUR NEGATIVE 11/14/2014 2150   UROBILINOGEN 0.2 11/22/2015 1051   UROBILINOGEN 1.0 11/14/2014 2150   NITRITE Negative 11/22/2015 1054   NITRITE NEGATIVE 11/14/2014 2150   LEUKOCYTESUR 2+ (A) 11/22/2015 1054   Recent Results (from the past 240 hour(s))  SARS Coronavirus 2 [facility] order, Performed in [facility] hospital lab) Nasopharyngeal Nasopharyngeal Swab     Status: Abnormal   Collection Time: 12/04/18 10:50 AM   Specimen: Nasopharyngeal Swab  Result Value Ref Range Status   SARS Coronavirus  2 POSITIVE (A) NEGATIVE Final    Comment: CRITICAL RESULT CALLED TO, READ BACK BY AND VERIFIED WITH: RN DEASHA  [phone] FCP (NOTE) If result is NEGATIVE SARS-CoV-2 target nucleic acids are NOT DETECTED. The SARS-CoV-2 RNA is generally detectable in upper and lower  respiratory specimens during the acute phase of infection. The lowest  concentration of SARS-CoV-2 viral copies this assay can detect is 250  copies / mL. A negative result does not preclude SARS-CoV-2 infection  and should not be used as the sole basis for treatment or other  patient management decisions.  A negative result may occur with  improper specimen collection / handling, submission of specimen other  than nasopharyngeal swab, presence of viral mutation(s) within the  areas targeted by this assay, and inadequate number of viral copies  (<250 copies / mL). A negative result must be combined with clinical  observations, patient history, and epidemiological information. If  result is POSITIVE SARS-CoV-2 target nucleic acids are DETECTED. T he SARS-CoV-2 RNA is generally detectable in upper and lower  respiratory specimens during the acute phase of infection.  Positive  results are indicative of active infection with SARS-CoV-2.  Clinical  correlation with patient history and other diagnostic information is  necessary to determine patient infection status.  Positive results do  not rule out bacterial infection or co-infection with other viruses. If result is PRESUMPTIVE POSTIVE SARS-CoV-2 nucleic acids MAY BE PRESENT.   A presumptive positive result was obtained on the submitted specimen  and confirmed on repeat testing.  While 2019 novel coronavirus  (SARS-CoV-2) nucleic acids may be present in the submitted sample  additional confirmatory testing may be necessary for epidemiological  and / or clinical management purposes  to differentiate between  SARS-CoV-2 and other Sarbecovirus currently known to infect humans.  If clinically indicated additional testing with an alternate test  methodology [phone]) is  advised. The SARS-CoV-2 RNA is generally  detectable in upper and lower respiratory specimens during the acute  phase of infection. The expected result is Negative. Fact Sheet for Patients:  BoilerBrush.com.cy Fact Sheet for Healthcare Providers: https://pope.com/ This test is not yet approved or cleared by the [place] FDA and has been authorized for detection and/or diagnosis of SARS-CoV-2 by FDA under an Emergency Use Authorization (EUA).  This EUA will remain in effect (meaning this test can be used) for the duration of the COVID-19 declaration under Section 564(b)(1) of the Act, 21 U.S.C. section 360bbb-3(b)(1), unless the authorization is terminated or revoked sooner. Performed at [facility], [address], [place], [place] [address]   Blood Culture (routine x 2)     Status: None     Collection Time: 12/04/18 12:20 PM   Specimen: BLOOD RIGHT FOREARM  Result Value Ref Range Status   Specimen Description BLOOD RIGHT FOREARM  Final   Special Requests   Final    BOTTLES DRAWN AEROBIC AND ANAEROBIC Blood Culture adequate volume   Culture   Final    NO GROWTH 5 DAYS Performed at [facility], [address], [place], [place] [address]    Report Status 12/09/2018 FINAL  Final  Blood Culture (routine x 2)     Status: None   Collection Time: 12/04/18  1:19 PM   Specimen: BLOOD  Result Value Ref Range Status   Specimen Description BLOOD LEFT ANTECUBITAL  Final   Special Requests   Final    BOTTLES DRAWN AEROBIC AND ANAEROBIC Blood Culture adequate volume  Culture   Final    NO GROWTH 5 DAYS Performed at [facility], [address], [place], [place] [address]    Report Status 12/09/2018 FINAL  Final      Radiology Studies: Dg Chest Port 1 View  Result Date: 12/08/2018 CLINICAL DATA:  Shortness of breath EXAM: PORTABLE CHEST 1 VIEW COMPARISON:  12/04/2018 FINDINGS: Stable cardiomediastinal contours. Persistent patchy bilateral airspace opacities throughout both lungs, most confluent within the right upper lobe. There may be slight improved aeration within the right upper lobe, otherwise little change. No pleural effusion or pneumothorax. IMPRESSION: Slight improved aeration of the right upper lobe. Multifocal airspace opacities compatible with acute viral pneumonia. Electronically Signed   By: Duanne Guess M.D.   On: 12/08/2018 10:40    Scheduled Meds:  dexamethasone  6 mg Oral Q24H   enoxaparin (LOVENOX) injection  55 mg Subcutaneous Q24H   levothyroxine  50 mcg Oral QAC breakfast   multivitamin with minerals  1 tablet Oral Daily   Continuous Infusions:  lactated ringers 75 mL/hr at 12/08/18 1202     LOS: 5 days   Time spent: 35 minutes.  Tyrone Nine, MD [facility] [email or web address] Password [email or web address] 12/09/2018, 10:59 AM

## 2018-12-10 LAB — CBC WITH DIFFERENTIAL/PLATELET
Abs Immature Granulocytes: 0.69 10*3/uL — ABNORMAL HIGH (ref 0.00–0.07)
Basophils Absolute: 0.1 10*3/uL (ref 0.0–0.1)
Basophils Relative: 0 %
Eosinophils Absolute: 0 10*3/uL (ref 0.0–0.5)
Eosinophils Relative: 0 %
HCT: 28.3 % — ABNORMAL LOW (ref 36.0–46.0)
Hemoglobin: 9 g/dL — ABNORMAL LOW (ref 12.0–15.0)
Immature Granulocytes: 5 %
Lymphocytes Relative: 9 %
Lymphs Abs: 1.1 10*3/uL (ref 0.7–4.0)
MCH: 23.7 pg — ABNORMAL LOW (ref 26.0–34.0)
MCHC: 31.8 g/dL (ref 30.0–36.0)
MCV: 74.7 fL — ABNORMAL LOW (ref 80.0–100.0)
Monocytes Absolute: 0.6 10*3/uL (ref 0.1–1.0)
Monocytes Relative: 5 %
Neutro Abs: 10.3 10*3/uL — ABNORMAL HIGH (ref 1.7–7.7)
Neutrophils Relative %: 81 %
Platelets: 488 10*3/uL — ABNORMAL HIGH (ref 150–400)
RBC: 3.79 MIL/uL — ABNORMAL LOW (ref 3.87–5.11)
RDW: 14.2 % (ref 11.5–15.5)
WBC: 12.7 10*3/uL — ABNORMAL HIGH (ref 4.0–10.5)
nRBC: 0 % (ref 0.0–0.2)

## 2018-12-10 LAB — COMPREHENSIVE METABOLIC PANEL WITH GFR
ALT: 15 U/L (ref 0–44)
AST: 28 U/L (ref 15–41)
Albumin: 2.4 g/dL — ABNORMAL LOW (ref 3.5–5.0)
Alkaline Phosphatase: 57 U/L (ref 38–126)
Anion gap: 9 (ref 5–15)
BUN: 25 mg/dL — ABNORMAL HIGH (ref 8–23)
CO2: 27 mmol/L (ref 22–32)
Calcium: 8.2 mg/dL — ABNORMAL LOW (ref 8.9–10.3)
Chloride: 98 mmol/L (ref 98–111)
Creatinine, Ser: 0.85 mg/dL (ref 0.44–1.00)
GFR calc Af Amer: >60 mL/min
GFR calc non Af Amer: >60 mL/min
Glucose, Bld: 142 mg/dL — ABNORMAL HIGH (ref 70–99)
Potassium: 4.5 mmol/L (ref 3.5–5.1)
Sodium: 134 mmol/L — ABNORMAL LOW (ref 135–145)
Total Bilirubin: 0.2 mg/dL — ABNORMAL LOW (ref 0.3–1.2)
Total Protein: 6.3 g/dL — ABNORMAL LOW (ref 6.5–8.1)

## 2018-12-10 LAB — PROCALCITONIN: Procalcitonin: <0.1 ng/mL

## 2018-12-10 LAB — D-DIMER, QUANTITATIVE: D-Dimer, Quant: 1.26 ug{FEU}/mL — ABNORMAL HIGH (ref 0.00–0.50)

## 2018-12-10 LAB — C-REACTIVE PROTEIN: CRP: 10.8 mg/dL — ABNORMAL HIGH

## 2018-12-10 LAB — FERRITIN: Ferritin: 304 ng/mL (ref 11–307)

## 2018-12-10 NOTE — Plan of Care (Signed)
  Problem: Education: Goal: Knowledge of risk factors and measures for prevention of condition will improve Outcome: Progressing   Problem: Coping: Goal: Psychosocial and spiritual needs will be supported Outcome: Progressing   Problem: Education: Goal: Knowledge of General Education information will improve Description: Including pain rating scale, medication(s)/side effects and non-pharmacologic comfort measures Outcome: Progressing   Problem: Clinical Measurements: Goal: Ability to maintain clinical measurements within normal limits will improve Outcome: Progressing Goal: Diagnostic test results will improve Outcome: Progressing Goal: Respiratory complications will improve Outcome: Progressing

## 2018-12-10 NOTE — Progress Notes (Signed)
PROGRESS NOTE  Nancy [name]  DDU:[id number] DOB: 12-15-1955 DOA: 12/04/2018 PCP: Nancy Lagos, MD  Brief Narrative: Nancy [name] a 63 y.o.femalewith medical history significant ofobesity, hypertension, macular degeneration whose daughter has been covidpositive and is a source of exposure. She reports a 2-week history of persistent cough and has had progressively increasing shortness of breath.Because of her increasing shortness of breath and rapid respirations she comes tothe emergency department for further evaluation. Patient is uncomfortable but not in distress. Sheis pain-free.In ED patient is hemodynamically stable, respiratory rates in the high 20s, O2 saturations are in the low 90s. Rapid COVID screen was positive. White blood count is normal. Inflammatory markers are positive with an elevated LDH, elevated ferritin, elevated CRP at 24.4, positive d-dimer at 1.46, fibrinogen greater than 800. CXR revealed patchy groundglass infiltrates right greater than left. AKI w/ Cr 1.9 since improved with IVF.  Assessment & Plan: Principal Problem:   Pneumonia due to COVID-19 virus Active Problems:   Hypothyroidism   Essential hypertension   Obstructive sleep apnea   AKI (acute kidney injury) (HCC)   Acute respiratory failure with hypoxia (HCC)   Acute hypoxic respiratory failure due to covid-19 pneumonia, possible superimposed bacterial PNA: Worsening hypoxia beginning 10/1 with CRP bumping 12 > 13 > 14 and stabilized to 10.8 on 10/3. CXR on my personal review is essentially stable with bilateral infiltrates 10/1, agree with report that RUL opacity may be improving.  - Completed 5 days remdesivir  - PCT undetectable, having completed 5 days antibiotics. Monitor sputum Cx  - Complete 10 days steroids - Continue vitamin C and zinc - Wean O2 as tolerated. Daily ambulatory pulse oximetry and continue OOB encouraged.  - Continue incentive spirometry as tolerated.   AKI:  Improved, though BUN:Cr remains >20:1. Pt tolerating diet, so will stop IVF.  - Monitor in AM, avoid nephrotoxins  Hyponatremia: Hypovolemic.  - Improved with LR as above. Will monitor after discontinuing IVF.  HTN:  - Continue current management  OSA:  - CPAP at home, O2 here.   Hypothyroidism: TSH 2.327 at last check.  - Continue synthroid.   Obesity: BMI 44  DVT prophylaxis: Weight-based lovenox Code Status: Full Family Communication: None at bedside Disposition Plan: Uncertain, will DC home with home health PT once stable and requiring only 2LPM O2.  Consultants:   None  Procedures:   None  Antimicrobials:  Ceftriaxone, azithromycin, remdesivir 9/27 - 10/1.    Subjective: Shortness of breath remains severe with exertion, takes 10 min or so to improve after resting. Feels she sat up in bed too long yesterday and is tired. Cough is stable. No chest pain.   Objective: Vitals:   12/09/18 1634 12/09/18 2100 12/10/18 0500 12/10/18 0747  BP: 125/67 (!) 124/59 (!) 108/54 (!) 119/57  Pulse: 70 72  (!) 59  Resp: 19 18 16 16   Temp: 99.1 F (37.3 C) 98.8 F (37.1 C) 98.2 F (36.8 C) 97.7 F (36.5 C)  TempSrc: Oral Oral Oral Oral  SpO2: 97% 98% 96% 96%  Weight:      Height:        Intake/Output Summary (Last 24 hours) at 12/10/2018 1026 Last data filed at 12/10/2018 0953 Gross per 24 hour  Intake 2131.74 ml  Output 1875 ml  Net 256.74 ml   Filed Weights   12/04/18 1147  Weight: 113.4 kg   Gen: 63 y.o. female in no distress Pulm: Mildly labored after some exertion, tachypneic with diffuse crackles bilaterally, good aeration.  CV: Regular rate and rhythm. No murmur, rub, or gallop. No JVD, no dependent edema. GI: Abdomen soft, non-tender, non-distended, with normoactive bowel sounds.  Ext: Warm, no deformities Skin: No rashes, lesions or ulcers on visualized skin. Neuro: Alert and oriented. No focal neurological deficits. Diffusely weak. Psych: Judgement and  insight appear fair. Mood euthymic & affect congruent. Behavior is appropriate.    Data Reviewed: I have personally reviewed following labs and imaging studies  CBC: Recent Labs  Lab 12/04/18 1221  12/06/18 0240 12/07/18 0425 12/08/18 0308 12/09/18 0213 12/10/18 0145  WBC 7.5   < > 17.2* 13.8* 10.6* 12.0* 12.7*  NEUTROABS 6.1  --   --   --   --   --  10.3*  HGB 9.6*   < > 8.8* 9.3* 9.4* 9.4* 9.0*  HCT 30.8*   < > 28.6* 29.6* 29.1* 29.3* 28.3*  MCV 77.8*   < > 78.1* 76.7* 75.0* 74.9* 74.7*  PLT 284   < > 253 334 375 448* 488*   < > = values in this interval not displayed.   Basic Metabolic Panel: Recent Labs  Lab 12/06/18 0240 12/07/18 0425 12/08/18 0308 12/09/18 0213 12/10/18 0145  NA 129* 132* 132* 135 134*  K 4.2 4.3 4.3 4.2 4.5  CL 93* 97* 97* 97* 98  CO2 25 27 30 28 27   GLUCOSE 120* 108* 133* 143* 142*  BUN 37* 38* 30* 24* 25*  CREATININE 1.34* 0.97 0.94 0.89 0.85  CALCIUM 7.6* 7.9* 8.1* 8.3* 8.2*   GFR: Estimated Creatinine Clearance: 80 mL/min (by C-G formula based on SCr of 0.85 mg/dL). Liver Function Tests: Recent Labs  Lab 12/06/18 0240 12/07/18 0425 12/08/18 0308 12/09/18 0213 12/10/18 0145  AST 27 25 24 24 28   ALT 12 11 11 13 15   ALKPHOS 54 56 58 58 57  BILITOT 0.4 0.5 0.2* 0.3 0.2*  PROT 7.4 7.0 6.7 6.9 6.3*  ALBUMIN 3.0* 2.8* 2.4* 2.5* 2.4*   No results for input(s): LIPASE, AMYLASE in the last 168 hours. No results for input(s): AMMONIA in the last 168 hours. Coagulation Profile: No results for input(s): INR, PROTIME in the last 168 hours. Cardiac Enzymes: No results for input(s): CKTOTAL, CKMB, CKMBINDEX, TROPONINI in the last 168 hours. BNP (last 3 results) No results for input(s): PROBNP in the last 8760 hours. HbA1C: No results for input(s): HGBA1C in the last 72 hours. CBG: No results for input(s): GLUCAP in the last 168 hours. Lipid Profile: No results for input(s): CHOL, HDL, LDLCALC, TRIG, CHOLHDL, LDLDIRECT in the last 72  hours. Thyroid Function Tests: No results for input(s): TSH, T4TOTAL, FREET4, T3FREE, THYROIDAB in the last 72 hours. Anemia Panel: Recent Labs    12/09/18 0213 12/10/18 0145  FERRITIN 346* 304   Urine analysis:    Component Value Date/Time   COLORURINE YELLOW 11/14/2014 2150   APPEARANCEUR Clear 11/22/2015 1054   LABSPEC 1.014 11/14/2014 2150   PHURINE 6.5 11/14/2014 2150   GLUCOSEU Negative 11/22/2015 1054   GLUCOSEU NEG mg/dL 45/40/981103/10/2009 [id number]   HGBUR NEGATIVE 11/14/2014 2150   HGBUR negative 07/29/2006 0856   BILIRUBINUR Negative 11/22/2015 1054   KETONESUR NEGATIVE 11/14/2014 2150   PROTEINUR Negative 11/22/2015 1054   PROTEINUR NEGATIVE 11/14/2014 2150   UROBILINOGEN 0.2 11/22/2015 1051   UROBILINOGEN 1.0 11/14/2014 2150   NITRITE Negative 11/22/2015 1054   NITRITE NEGATIVE 11/14/2014 2150   LEUKOCYTESUR 2+ (A) 11/22/2015 1054   Recent Results (from the past 240 hour(s))  SARS Coronavirus  2 [facility] order, Performed in [facility] hospital lab) Nasopharyngeal Nasopharyngeal Swab     Status: Abnormal   Collection Time: 12/04/18 10:50 AM   Specimen: Nasopharyngeal Swab  Result Value Ref Range Status   SARS Coronavirus 2 POSITIVE (A) NEGATIVE Final    Comment: CRITICAL RESULT CALLED TO, READ BACK BY AND VERIFIED WITH: RN DEASHA  [id number] FCP (NOTE) If result is NEGATIVE SARS-CoV-2 target nucleic acids are NOT DETECTED. The SARS-CoV-2 RNA is generally detectable in upper and lower  respiratory specimens during the acute phase of infection. The lowest  concentration of SARS-CoV-2 viral copies this assay can detect is 250  copies / mL. A negative result does not preclude SARS-CoV-2 infection  and should not be used as the sole basis for treatment or other  patient management decisions.  A negative result may occur with  improper specimen collection / handling, submission of specimen other  than nasopharyngeal swab, presence of viral mutation(s) within the  areas  targeted by this assay, and inadequate number of viral copies  (<250 copies / mL). A negative result must be combined with clinical  observations, patient history, and epidemiological information. If result is POSITIVE SARS-CoV-2 target nucleic acids are DETECTED. T he SARS-CoV-2 RNA is generally detectable in upper and lower  respiratory specimens during the acute phase of infection.  Positive  results are indicative of active infection with SARS-CoV-2.  Clinical  correlation with patient history and other diagnostic information is  necessary to determine patient infection status.  Positive results do  not rule out bacterial infection or co-infection with other viruses. If result is PRESUMPTIVE POSTIVE SARS-CoV-2 nucleic acids MAY BE PRESENT.   A presumptive positive result was obtained on the submitted specimen  and confirmed on repeat testing.  While 2019 novel coronavirus  (SARS-CoV-2) nucleic acids may be present in the submitted sample  additional confirmatory testing may be necessary for epidemiological  and / or clinical management purposes  to differentiate between  SARS-CoV-2 and other Sarbecovirus currently known to infect humans.  If clinically indicated additional testing with an alternate test  methodology [phone]) is  advised. The SARS-CoV-2 RNA is generally  detectable in upper and lower respiratory specimens during the acute  phase of infection. The expected result is Negative. Fact Sheet for Patients:  BoilerBrush.com.cy Fact Sheet for Healthcare Providers: https://pope.com/ This test is not yet approved or cleared by the [place] FDA and has been authorized for detection and/or diagnosis of SARS-CoV-2 by FDA under an Emergency Use Authorization (EUA).  This EUA will remain in effect (meaning this test can be used) for the duration of the COVID-19 declaration under Section 564(b)(1) of the Act, 21 U.S.C. section  360bbb-3(b)(1), unless the authorization is terminated or revoked sooner. Performed at [facility], [address], [place], [place] [address]   Blood Culture (routine x 2)     Status: None   Collection Time: 12/04/18 12:20 PM   Specimen: BLOOD RIGHT FOREARM  Result Value Ref Range Status   Specimen Description BLOOD RIGHT FOREARM  Final   Special Requests   Final    BOTTLES DRAWN AEROBIC AND ANAEROBIC Blood Culture adequate volume   Culture   Final    NO GROWTH 5 DAYS Performed at [facility], [address] [place], [place] [address]    Report Status 12/09/2018 FINAL  Final  Blood Culture (routine x 2)     Status: None   Collection Time: 12/04/18  1:19  PM   Specimen: BLOOD  Result Value Ref Range Status   Specimen Description BLOOD LEFT ANTECUBITAL  Final   Special Requests   Final    BOTTLES DRAWN AEROBIC AND ANAEROBIC Blood Culture adequate volume   Culture   Final    NO GROWTH 5 DAYS Performed at [facility], [address], [place], [place] [address]    Report Status 12/09/2018 FINAL  Final  Expectorated sputum assessment w rflx to resp cult     Status: None   Collection Time: 12/09/18  2:10 PM   Specimen: Sputum  Result Value Ref Range Status   Specimen Description SPUTUM  Final   Special Requests NONE  Final   Sputum evaluation   Final    THIS SPECIMEN IS ACCEPTABLE FOR SPUTUM CULTURE Performed at [facility], [place] [address], [place], [place] [address]    Report Status 12/09/2018 FINAL  Final  Culture, respiratory     Status: None (Preliminary result)   Collection Time: 12/09/18  2:10 PM   Specimen: SPU  Result Value Ref Range Status   Specimen Description   Final    SPUTUM Performed at [facility] [address], [place], [place] [address]    Special Requests   Final    NONE Reflexed from [id number] Performed at [facility] [address] [place], [place] [address]    Gram  Stain   Final    RARE WBC PRESENT, PREDOMINANTLY PMN RARE GRAM POSITIVE COCCI RARE GRAM NEGATIVE RODS RARE GRAM POSITIVE RODS    Culture   Final    CULTURE REINCUBATED FOR BETTER GROWTH Performed at [facility], [place] [address], [place], [place] [address]    Report Status PENDING  Incomplete      Radiology Studies: Dg Chest Port 1 View  Result Date: 12/08/2018 CLINICAL DATA:  Shortness of breath EXAM: PORTABLE CHEST 1 VIEW COMPARISON:  12/04/2018 FINDINGS: Stable cardiomediastinal contours. Persistent patchy bilateral airspace opacities throughout both lungs, most confluent within the right upper lobe. There may be slight improved aeration within the right upper lobe, otherwise little change. No pleural effusion or pneumothorax. IMPRESSION: Slight improved aeration of the right upper lobe. Multifocal airspace opacities compatible with acute viral pneumonia. Electronically Signed   By: Davina Poke M.D.   On: 12/08/2018 10:40    Scheduled Meds: . dexamethasone  6 mg Oral Q24H  . enoxaparin (LOVENOX) injection  55 mg Subcutaneous Q24H  . levothyroxine  50 mcg Oral QAC breakfast  . multivitamin with minerals  1 tablet Oral Daily   Continuous Infusions: . lactated ringers 75 mL/hr at 12/09/18 2327     LOS: 6 days   Time spent: 35 minutes.  Patrecia Pour, MD [facility] [email or web address] Password [email or web address] 12/10/2018, 10:26 AM

## 2018-12-10 NOTE — Progress Notes (Signed)
At rest, 3L o2 [place], spo2 91%.  Ambulated 64ft on 3L o2 [place]. After walking 79ft, oxygen desat to 80%. Patient reported that wearing her surgical mask made it difficult for her to breath. Oxygen was increased to 5L o2 [place] until back to chair. Once settled, returned oxygen to 3L o2, spo2 92-93% at rest in chair.

## 2018-12-11 LAB — CBC WITH DIFFERENTIAL/PLATELET
Abs Immature Granulocytes: 0.64 10*3/uL — ABNORMAL HIGH (ref 0.00–0.07)
Basophils Absolute: 0.1 10*3/uL (ref 0.0–0.1)
Basophils Relative: 1 %
Eosinophils Absolute: 0 10*3/uL (ref 0.0–0.5)
Eosinophils Relative: 0 %
HCT: 29.2 % — ABNORMAL LOW (ref 36.0–46.0)
Hemoglobin: 9.3 g/dL — ABNORMAL LOW (ref 12.0–15.0)
Immature Granulocytes: 6 %
Lymphocytes Relative: 7 %
Lymphs Abs: 0.8 10*3/uL (ref 0.7–4.0)
MCH: 24 pg — ABNORMAL LOW (ref 26.0–34.0)
MCHC: 31.8 g/dL (ref 30.0–36.0)
MCV: 75.3 fL — ABNORMAL LOW (ref 80.0–100.0)
Monocytes Absolute: 0.5 10*3/uL (ref 0.1–1.0)
Monocytes Relative: 4 %
Neutro Abs: 9.3 10*3/uL — ABNORMAL HIGH (ref 1.7–7.7)
Neutrophils Relative %: 82 %
Platelets: 537 10*3/uL — ABNORMAL HIGH (ref 150–400)
RBC: 3.88 MIL/uL (ref 3.87–5.11)
RDW: 14.5 % (ref 11.5–15.5)
WBC: 11.3 10*3/uL — ABNORMAL HIGH (ref 4.0–10.5)
nRBC: 0 % (ref 0.0–0.2)

## 2018-12-11 LAB — BASIC METABOLIC PANEL WITH GFR
Anion gap: 8 (ref 5–15)
BUN: 26 mg/dL — ABNORMAL HIGH (ref 8–23)
CO2: 29 mmol/L (ref 22–32)
Calcium: 8.4 mg/dL — ABNORMAL LOW (ref 8.9–10.3)
Chloride: 98 mmol/L (ref 98–111)
Creatinine, Ser: 0.93 mg/dL (ref 0.44–1.00)
GFR calc Af Amer: >60 mL/min
GFR calc non Af Amer: >60 mL/min
Glucose, Bld: 155 mg/dL — ABNORMAL HIGH (ref 70–99)
Potassium: 4.8 mmol/L (ref 3.5–5.1)
Sodium: 135 mmol/L (ref 135–145)

## 2018-12-11 LAB — C-REACTIVE PROTEIN: CRP: 7.5 mg/dL — ABNORMAL HIGH

## 2018-12-11 LAB — D-DIMER, QUANTITATIVE: D-Dimer, Quant: 1.36 ug{FEU}/mL — ABNORMAL HIGH (ref 0.00–0.50)

## 2018-12-11 NOTE — Plan of Care (Signed)
Pt progressing well, updated her on the plan of care. She denies any questions or concerns.   Problem: Education: Goal: Knowledge of risk factors and measures for prevention of condition will improve Outcome: Progressing   Problem: Coping: Goal: Psychosocial and spiritual needs will be supported Outcome: Progressing   Problem: Respiratory: Goal: Will maintain a patent airway Outcome: Progressing Goal: Complications related to the disease process, condition or treatment will be avoided or minimized Outcome: Progressing   Problem: Education: Goal: Knowledge of General Education information will improve Description: Including pain rating scale, medication(s)/side effects and non-pharmacologic comfort measures Outcome: Progressing   Problem: Health Behavior/Discharge Planning: Goal: Ability to manage health-related needs will improve Outcome: Progressing   Problem: Clinical Measurements: Goal: Ability to maintain clinical measurements within normal limits will improve Outcome: Progressing Goal: Will remain free from infection Outcome: Progressing Goal: Diagnostic test results will improve Outcome: Progressing Goal: Respiratory complications will improve Outcome: Progressing Goal: Cardiovascular complication will be avoided Outcome: Progressing   Problem: Activity: Goal: Risk for activity intolerance will decrease Outcome: Progressing   Problem: Nutrition: Goal: Adequate nutrition will be maintained Outcome: Progressing   Problem: Coping: Goal: Level of anxiety will decrease Outcome: Progressing   Problem: Elimination: Goal: Will not experience complications related to bowel motility Outcome: Progressing Goal: Will not experience complications related to urinary retention Outcome: Progressing   Problem: Pain Managment: Goal: General experience of comfort will improve Outcome: Progressing   Problem: Safety: Goal: Ability to remain free from injury will  improve Outcome: Progressing   Problem: Skin Integrity: Goal: Risk for impaired skin integrity will decrease Outcome: Progressing

## 2018-12-11 NOTE — Progress Notes (Signed)
Called and spoke with pt's husband/point of contact [facility]. I updated him on the pt's condition and O2 requirement at this time. He explained that the pt uses an electric wheelchair at baseline and does not walk much. He feels as if pt's desaturation with walking is normal for her before admission. Mortimer Fries stated that he spoke with Dr. Bonner Puna this morning and made him aware of this information also. He denied any further questions.

## 2018-12-11 NOTE — Progress Notes (Signed)
PROGRESS NOTE  Nancy [name]  RFF:[id number] DOB: [birth year] DOA: 12/04/2018 PCP: Earl Lagos, MD  Brief Narrative: Nancy [name] a 63 y.o.femalewith medical history significant ofobesity, hypertension, macular degeneration whose daughter has been covidpositive and is a source of exposure. She reports a 2-week history of persistent cough and has had progressively increasing shortness of breath.Because of her increasing shortness of breath and rapid respirations she comes tothe emergency department for further evaluation. Patient is uncomfortable but not in distress. Sheis pain-free.In ED patient is hemodynamically stable, respiratory rates in the high 20s, O2 saturations are in the low 90s. Rapid COVID screen was positive. White blood count is normal. Inflammatory markers are positive with an elevated LDH, elevated ferritin, elevated CRP at 24.4, positive d-dimer at 1.46, fibrinogen greater than 800. CXR revealed patchy groundglass infiltrates right greater than left. AKI w/ Cr 1.9 since improved with IVF.  Assessment & Plan: Principal Problem:   Pneumonia due to COVID-19 virus Active Problems:   Hypothyroidism   Essential hypertension   Obstructive sleep apnea   AKI (acute kidney injury) (HCC)   Acute respiratory failure with hypoxia (HCC)   Acute hypoxic respiratory failure due to covid-19 pneumonia, possible superimposed bacterial PNA: Worsening hypoxia beginning 10/1 with CRP bumping 12 > 13 > 14. Fortunately hypoxia stabilized and CRP began trending downward 10/3. CXR 10/1 essentially stable with bilateral infiltrates. - Completed 5 days remdesivir  - PCT undetectable, having completed 5 days antibiotics. Monitor sputum Cx (no growth) - Complete 10 days steroids (day 8 today) - Continue vitamin C and zinc - Wean O2 as tolerated, required 5L O2 10/3. Daily ambulatory pulse oximetry and continue OOB encouraged.  - Continue incentive spirometry as tolerated.    AKI: Improved. Stable, so will not recheck.  - Monitor at follow up.  - Continue to avoid nephrotoxins  Hyponatremia: Hypovolemic. Resolved on 10/4 off IVF.   HTN:  - Continue current management  OSA:  - CPAP at home, O2 here.   Hypothyroidism: TSH 2.327 at last check.  - Continue synthroid.   Obesity: BMI 44  DVT prophylaxis: Weight-based lovenox Code Status: Full Family Communication: None at bedside. Will call husband later today. Disposition Plan: Uncertain, will DC home with home health PT once able to wean oxygen to 2-3L in setting of sustained improvement. Possibly 10/5.  Consultants:   None  Procedures:   None  Antimicrobials:  Ceftriaxone, azithromycin, remdesivir 9/27 - 10/1.    Subjective: Reports feeling well, no chest pain or dyspnea at rest. Was short of breath walking 64ft yesterday on 5L O2 with RN but also doesn't walk much at baseline, has electric wheelchair. No fevers, wants to go home.   Objective: Vitals:   12/11/18 0531 12/11/18 0700 12/11/18 0820 12/11/18 0825  BP: 123/62 129/64    Pulse: 72 63    Resp:      Temp: 98 F (36.7 C) 97.8 F (36.6 C)    TempSrc: Oral Oral    SpO2: 96% 96% (!) 81% 93%  Weight:      Height:        Intake/Output Summary (Last 24 hours) at 12/11/2018 0934 Last data filed at 12/11/2018 0600 Gross per 24 hour  Intake 1413.15 ml  Output 2350 ml  Net -936.85 ml   Filed Weights   12/04/18 1147  Weight: 113.4 kg   Gen: 63 y.o. female in no distress Pulm: Nonlabored with SpO2 92% on 3L O2 at rest. Still crackles bilaterally. CV: Regular rate and  rhythm. No murmur, rub, or gallop. No JVD, no dependent edema. GI: Abdomen soft, non-tender, non-distended, with normoactive bowel sounds.  Ext: Warm, no deformities Skin: No rashes, lesions or ulcers on visualized skin. Neuro: Alert and oriented. No focal neurological deficits. Psych: Judgement and insight appear fair. Mood euthymic & affect congruent. Behavior  is appropriate.    Data Reviewed: I have personally reviewed following labs and imaging studies  CBC: Recent Labs  Lab 12/04/18 1221  12/07/18 0425 12/08/18 0308 12/09/18 0213 12/10/18 0145 12/11/18 0207  WBC 7.5   < > 13.8* 10.6* 12.0* 12.7* 11.3*  NEUTROABS 6.1  --   --   --   --  10.3* 9.3*  HGB 9.6*   < > 9.3* 9.4* 9.4* 9.0* 9.3*  HCT 30.8*   < > 29.6* 29.1* 29.3* 28.3* 29.2*  MCV 77.8*   < > 76.7* 75.0* 74.9* 74.7* 75.3*  PLT 284   < > 334 375 448* 488* 537*   < > = values in this interval not displayed.   Basic Metabolic Panel: Recent Labs  Lab 12/07/18 0425 12/08/18 0308 12/09/18 0213 12/10/18 0145 12/11/18 0207  NA 132* 132* 135 134* 135  K 4.3 4.3 4.2 4.5 4.8  CL 97* 97* 97* 98 98  CO2 27 30 28 27 29   GLUCOSE 108* 133* 143* 142* 155*  BUN 38* 30* 24* 25* 26*  CREATININE 0.97 0.94 0.89 0.85 0.93  CALCIUM 7.9* 8.1* 8.3* 8.2* 8.4*   GFR: Estimated Creatinine Clearance: 73.1 mL/min (by C-G formula based on SCr of 0.93 mg/dL). Liver Function Tests: Recent Labs  Lab 12/06/18 0240 12/07/18 0425 12/08/18 0308 12/09/18 0213 12/10/18 0145  AST 27 25 24 24 28   ALT 12 11 11 13 15   ALKPHOS 54 56 58 58 57  BILITOT 0.4 0.5 0.2* 0.3 0.2*  PROT 7.4 7.0 6.7 6.9 6.3*  ALBUMIN 3.0* 2.8* 2.4* 2.5* 2.4*   Anemia Panel: Recent Labs    12/09/18 0213 12/10/18 0145  FERRITIN 346* 304   Urine analysis:    Component Value Date/Time   COLORURINE YELLOW 11/14/2014 2150   APPEARANCEUR Clear 11/22/2015 1054   LABSPEC 1.014 11/14/2014 2150   PHURINE 6.5 11/14/2014 2150   GLUCOSEU Negative 11/22/2015 1054   GLUCOSEU NEG mg/dL 16/10/960403/10/2009 [id number]   HGBUR NEGATIVE 11/14/2014 2150   HGBUR negative 07/29/2006 0856   BILIRUBINUR Negative 11/22/2015 1054   KETONESUR NEGATIVE 11/14/2014 2150   PROTEINUR Negative 11/22/2015 1054   PROTEINUR NEGATIVE 11/14/2014 2150   UROBILINOGEN 0.2 11/22/2015 1051   UROBILINOGEN 1.0 11/14/2014 2150   NITRITE Negative 11/22/2015 1054    NITRITE NEGATIVE 11/14/2014 2150   LEUKOCYTESUR 2+ (A) 11/22/2015 1054   Recent Results (from the past 240 hour(s))  SARS Coronavirus 2 [facility](Hospital order, Performed [facility]) Nasopharyngeal Nasopharyngeal Swab     Status: Abnormal   Collection Time: 12/04/18 10:50 AM   Specimen: Nasopharyngeal Swab  Result Value Ref Range Status   SARS Coronavirus 2 POSITIVE (A) NEGATIVE Final    Comment: CRITICAL RESULT CALLED TO, READ BACK BY AND VERIFIED WITH: RN DEASHA  1343 [id number] FCP (NOTE) If result is NEGATIVE SARS-CoV-2 target nucleic acids are NOT DETECTED. The SARS-CoV-2 RNA is generally detectable in upper and lower  respiratory specimens during the acute phase of infection. The lowest  concentration of SARS-CoV-2 viral copies this assay can detect is 250  copies / mL. A negative result does not preclude SARS-CoV-2 infection  and should not be  used as the sole basis for treatment or other  patient management decisions.  A negative result may occur with  improper specimen collection / handling, submission of specimen other  than nasopharyngeal swab, presence of viral mutation(s) within the  areas targeted by this assay, and inadequate number of viral copies  (<250 copies / mL). A negative result must be combined with clinical  observations, patient history, and epidemiological information. If result is POSITIVE SARS-CoV-2 target nucleic acids are DETECTED. T he SARS-CoV-2 RNA is generally detectable in upper and lower  respiratory specimens during the acute phase of infection.  Positive  results are indicative of active infection with SARS-CoV-2.  Clinical  correlation with patient history and other diagnostic information is  necessary to determine patient infection status.  Positive results do  not rule out bacterial infection or co-infection with other viruses. If result is PRESUMPTIVE POSTIVE SARS-CoV-2 nucleic acids MAY BE PRESENT.   A presumptive positive result  was obtained on the submitted specimen  and confirmed on repeat testing.  While 2019 novel coronavirus  (SARS-CoV-2) nucleic acids may be present in the submitted sample  additional confirmatory testing may be necessary for epidemiological  and / or clinical management purposes  to differentiate between  SARS-CoV-2 and other Sarbecovirus currently known to infect humans.  If clinically indicated additional testing with an alternate test  methodology [phone]) is  advised. The SARS-CoV-2 RNA is generally  detectable in upper and lower respiratory specimens during the acute  phase of infection. The expected result is Negative. Fact Sheet for Patients:  BoilerBrush.com.cy Fact Sheet for Healthcare Providers: https://pope.com/ This test is not yet approved or cleared by the [place] FDA and has been authorized for detection and/or diagnosis of SARS-CoV-2 by FDA under an Emergency Use Authorization (EUA).  This EUA will remain in effect (meaning this test can be used) for the duration of the COVID-19 declaration under Section 564(b)(1) of the Act, 21 U.S.C. section 360bbb-3(b)(1), unless the authorization is terminated or revoked sooner. Performed at [facility], [address], [place], [place] [address]   Blood Culture (routine x 2)     Status: None   Collection Time: 12/04/18 12:20 PM   Specimen: BLOOD RIGHT FOREARM  Result Value Ref Range Status   Specimen Description BLOOD RIGHT FOREARM  Final   Special Requests   Final    BOTTLES DRAWN AEROBIC AND ANAEROBIC Blood Culture adequate volume   Culture   Final    NO GROWTH 5 DAYS Performed at [facility], [address] [place], [place] [address]    Report Status 12/09/2018 FINAL  Final  Blood Culture (routine x 2)     Status: None   Collection Time: 12/04/18  1:19 PM   Specimen: BLOOD  Result Value Ref Range Status   Specimen Description BLOOD LEFT ANTECUBITAL   Final   Special Requests   Final    BOTTLES DRAWN AEROBIC AND ANAEROBIC Blood Culture adequate volume   Culture   Final    NO GROWTH 5 DAYS Performed at [facility], [address] [place], [place] [address]    Report Status 12/09/2018 FINAL  Final  Expectorated sputum assessment w rflx to resp cult     Status: None   Collection Time: 12/09/18  2:10 PM   Specimen: Sputum  Result Value Ref Range Status   Specimen Description SPUTUM  Final   Special Requests NONE  Final   Sputum evaluation  Final    THIS SPECIMEN IS ACCEPTABLE FOR SPUTUM CULTURE Performed at [facility] [address], [place], [place] [address]    Report Status 12/09/2018 FINAL  Final  Culture, respiratory     Status: None (Preliminary result)   Collection Time: 12/09/18  2:10 PM   Specimen: SPU  Result Value Ref Range Status   Specimen Description   Final    SPUTUM Performed at [facility] [address] [place], [place] [address]    Special Requests   Final    NONE Reflexed from [phone] Performed at [facility] [address], [place], [place] [address]    Gram Stain   Final    RARE WBC PRESENT, PREDOMINANTLY PMN RARE GRAM POSITIVE COCCI RARE GRAM NEGATIVE RODS RARE GRAM POSITIVE RODS    Culture   Final    CULTURE REINCUBATED FOR BETTER GROWTH Performed at [facility], [place] [address] [place], [place] [address]    Report Status PENDING  Incomplete      Radiology Studies: No results found.  Scheduled Meds:  dexamethasone  6 mg Oral Q24H   enoxaparin (LOVENOX) injection  55 mg Subcutaneous Q24H   levothyroxine  50 mcg Oral QAC breakfast   multivitamin with minerals  1 tablet Oral Daily   Continuous Infusions:    LOS: 7 days   Time spent: 35 minutes.  Patrecia Pour, MD [facility] [email or web address] Password [email or web address] 12/11/2018, 9:34 AM

## 2018-12-12 LAB — CULTURE, RESPIRATORY W GRAM STAIN: Culture: NORMAL

## 2018-12-12 LAB — C-REACTIVE PROTEIN: CRP: 5.1 mg/dL — ABNORMAL HIGH

## 2018-12-12 MED ORDER — FERROCITE 324 MG PO TABS: 1.0000 | ORAL_TABLET | Freq: Every day | ORAL | Status: DC

## 2018-12-12 MED ORDER — DEXAMETHASONE 6 MG PO TABS: 6.0000 mg | ORAL_TABLET | ORAL | 0 refills | Status: DC

## 2018-12-12 NOTE — Plan of Care (Signed)
Care plan resolved for discharge, pt verbalized an understanding of all information.    Problem: Education: Goal: Knowledge of risk factors and measures for prevention of condition will improve Outcome: Progressing   Problem: Coping: Goal: Psychosocial and spiritual needs will be supported Outcome: Progressing   Problem: Respiratory: Goal: Will maintain a patent airway Outcome: Progressing Goal: Complications related to the disease process, condition or treatment will be avoided or minimized Outcome: Progressing   Problem: Education: Goal: Knowledge of General Education information will improve Description: Including pain rating scale, medication(s)/side effects and non-pharmacologic comfort measures Outcome: Progressing   Problem: Health Behavior/Discharge Planning: Goal: Ability to manage health-related needs will improve Outcome: Progressing   Problem: Clinical Measurements: Goal: Ability to maintain clinical measurements within normal limits will improve Outcome: Progressing Goal: Will remain free from infection Outcome: Progressing Goal: Diagnostic test results will improve Outcome: Progressing Goal: Respiratory complications will improve Outcome: Progressing Goal: Cardiovascular complication will be avoided Outcome: Progressing   Problem: Activity: Goal: Risk for activity intolerance will decrease Outcome: Progressing   Problem: Nutrition: Goal: Adequate nutrition will be maintained Outcome: Progressing   Problem: Coping: Goal: Level of anxiety will decrease Outcome: Progressing   Problem: Elimination: Goal: Will not experience complications related to bowel motility Outcome: Progressing Goal: Will not experience complications related to urinary retention Outcome: Progressing   Problem: Pain Managment: Goal: General experience of comfort will improve Outcome: Progressing   Problem: Safety: Goal: Ability to remain free from injury will  improve Outcome: Progressing   Problem: Skin Integrity: Goal: Risk for impaired skin integrity will decrease Outcome: Progressing

## 2018-12-12 NOTE — Plan of Care (Signed)
Pt stable and no distress noted. She understands the plan of care with planned discharge today. She denies any further questions.    Problem: Education: Goal: Knowledge of risk factors and measures for prevention of condition will improve Outcome: Progressing   Problem: Coping: Goal: Psychosocial and spiritual needs will be supported Outcome: Progressing   Problem: Respiratory: Goal: Will maintain a patent airway Outcome: Progressing Goal: Complications related to the disease process, condition or treatment will be avoided or minimized Outcome: Progressing   Problem: Education: Goal: Knowledge of General Education information will improve Description: Including pain rating scale, medication(s)/side effects and non-pharmacologic comfort measures Outcome: Progressing   Problem: Health Behavior/Discharge Planning: Goal: Ability to manage health-related needs will improve Outcome: Progressing   Problem: Clinical Measurements: Goal: Ability to maintain clinical measurements within normal limits will improve Outcome: Progressing Goal: Will remain free from infection Outcome: Progressing Goal: Diagnostic test results will improve Outcome: Progressing Goal: Respiratory complications will improve Outcome: Progressing Goal: Cardiovascular complication will be avoided Outcome: Progressing   Problem: Activity: Goal: Risk for activity intolerance will decrease Outcome: Progressing   Problem: Nutrition: Goal: Adequate nutrition will be maintained Outcome: Progressing   Problem: Coping: Goal: Level of anxiety will decrease Outcome: Progressing   Problem: Elimination: Goal: Will not experience complications related to bowel motility Outcome: Progressing Goal: Will not experience complications related to urinary retention Outcome: Progressing   Problem: Pain Managment: Goal: General experience of comfort will improve Outcome: Progressing   Problem: Safety: Goal: Ability to  remain free from injury will improve Outcome: Progressing   Problem: Skin Integrity: Goal: Risk for impaired skin integrity will decrease Outcome: Progressing

## 2018-12-12 NOTE — Discharge Summary (Signed)
Physician Discharge Summary  Nancy [name] ZOX:[id number] DOB: 01/19/1956 DOA: 12/04/2018  PCP: Earl Lagos, MD  Admit date: 12/04/2018 Discharge date: 12/12/2018  Admitted From: Home Disposition: Home   Recommendations for Outpatient Follow-up:  1. Follow up with PCP in 1-2 weeks 2. Please obtain CMP/CBC in one week 3. PCT undetectable, having completed 5 days antibiotics. Monitor sputum Cx (reincubated at time of discharge)  Home Health: PT Equipment/Devices: 3L O2. Continue CPAP qHS. Has electric wheelchair at home. Discharge Condition: Stable, improved CODE STATUS: Full Diet recommendation: Heart healthy  Brief/Interim Summary: Nancy [name] a 63 y.o.femalewith medical history significant ofobesity, hypertension, macular degeneration whose daughter has been covidpositive and presented with shortness of breath.In ED patient was hemodynamically stable, respiratory rates in the high 20s, O2 saturations are in the low 90s. Rapid COVID screen was positive. White blood count is normal. Inflammatory markers were positive with an elevated LDH, elevated ferritin, elevated CRP at 24.4, positive d-dimer at 1.46, fibrinogen greater than 800. CXR revealed patchy groundglass infiltrates right greater than left. AKI w/ Cr 1.9 since improved with IVF and remains stable off of IV fluids for days. SARS-CoV-02 was confirmed to be positive. Remdesivir and steroids started, and patient admitted at [facility]. Hypoxia has improved and respiratory status has been stable for many days. Inflammatory markers showing significant improvement after 1 week of steroids. She is stable for discharge with home health arranged, and continued oxygen prescribed for short term. A 10-day course of steroids will be prescribed as well.   Discharge Diagnoses:  Principal Problem:   Pneumonia due to COVID-19 virus Active Problems:   Hypothyroidism   Essential hypertension   Obstructive sleep apnea   AKI (acute  kidney injury) (HCC)   Acute respiratory failure with hypoxia (HCC)  Acute hypoxic respiratory failure due to covid-19 pneumonia, possible superimposed bacterial PNA: Hypoxia stabilized and CRP began trending downward 10/3. CXR 10/1 essentially stable with bilateral infiltrates. - Completed 5 days remdesivir  - PCT undetectable, having completed 5 days antibiotics. Monitor sputum Cx (reincubated at time of discharge) - Complete 10 days steroids  - Continue vitamin C and zinc - Wean O2 as tolerated, discharged on 3L O2. Having significant exertional dyspnea which is not far from baseline. Suspect deconditioning. Pt has electric wheelchair at home. - Continue incentive spirometry as tolerated.   AKI: Improved. Stable, so will not recheck.  - Monitor at follow up.  - Continue to avoid nephrotoxins  Hyponatremia: Hypovolemic. Resolved on 10/4 off IVF.   HTN:  - Continue current management  OSA:  - CPAP at home, run O2 in line if possible  Hypothyroidism: TSH 2.327 at last check.  - Continue synthroid.   Morbid obesity: BMI 44  Discharge Instructions Discharge Instructions    Diet - low sodium heart healthy   Complete by: As directed    Discharge instructions   Complete by: As directed    You are being discharged from the hospital after treatment for covid-19 infection. You are felt to be stable enough to no longer require inpatient monitoring, testing, and treatment, though you will need to follow the recommendations below: - Continue taking steroids for 3 more days - Continue supplemental oxygen at home until you follow up with your primary doctor in the next week.  - Remain in self-isolation for 14 days following discharge to reduce risk of transmission of the virus.  - Do not take NSAID medications (including, but not limited to, ibuprofen, advil, motrin, naproxen, aleve, goody's powder,  etc.) - Follow up with your doctor in the next week via telehealth or seek medical  attention right away if your symptoms get WORSE.  - Consider donating plasma after you have recovered (either 14 days after a negative test or 28 days after symptoms have completely resolved) because your antibodies to this virus may be helpful to give to others with life-threatening infections. Please go to the website www.[email or web address] if you would like to consider volunteering for plasma donation.    Directions for you at home:  Wear a facemask You should wear a facemask that covers your nose and mouth when you are in the same room with other people and when you visit a healthcare provider. People who live with or visit you should also wear a facemask while they are in the same room with you.  Separate yourself from other people in your home As much as possible, you should stay in a different room from other people in your home. Also, you should use a separate bathroom, if available.  Avoid sharing household items You should not share dishes, drinking glasses, cups, eating utensils, towels, bedding, or other items with other people in your home. After using these items, you should wash them thoroughly with soap and water.  Cover your coughs and sneezes Cover your mouth and nose with a tissue when you cough or sneeze, or you can cough or sneeze into your sleeve. Throw used tissues in a lined trash can, and immediately wash your hands with soap and water for at least 20 seconds or use an alcohol-based hand rub.  Wash your [facility] your hands often and thoroughly with soap and water for at least 20 seconds. You can use an alcohol-based hand sanitizer if soap and water are not available and if your hands are not visibly dirty. Avoid touching your eyes, nose, and mouth with unwashed hands.  Directions for those who live with, or provide care at home for you:  Limit the number of people who have contact with the patient If possible, have only one caregiver for the patient. Other  household members should stay in another home or place of residence. If this is not possible, they should stay in another room, or be separated from the patient as much as possible. Use a separate bathroom, if available. Restrict visitors who do not have an essential need to be in the home.  Ensure good ventilation Make sure that shared spaces in the home have good air flow, such as from an air conditioner or an opened window, weather permitting.  Wash your hands often Wash your hands often and thoroughly with soap and water for at least 20 seconds. You can use an alcohol based hand sanitizer if soap and water are not available and if your hands are not visibly dirty. Avoid touching your eyes, nose, and mouth with unwashed hands. Use disposable paper towels to dry your hands. If not available, use dedicated cloth towels and replace them when they become wet.  Wear a facemask and gloves Wear a disposable facemask at all times in the room and gloves when you touch or have contact with the patient's blood, body fluids, and/or secretions or excretions, such as sweat, saliva, sputum, nasal mucus, vomit, urine, or feces.  Ensure the mask fits over your nose and mouth tightly, and do not touch it during use. Throw out disposable facemasks and gloves after using them. Do not reuse. Wash your hands immediately after removing your facemask and gloves.  If your personal clothing becomes contaminated, carefully remove clothing and launder. Wash your hands after handling contaminated clothing. Place all used disposable facemasks, gloves, and other waste in a lined container before disposing them with other household waste. Remove gloves and wash your hands immediately after handling these items.  Do not share dishes, glasses, or other household items with the patient Avoid sharing household items. You should not share dishes, drinking glasses, cups, eating utensils, towels, bedding, or other items with a  patient who is confirmed to have, or being evaluated for, COVID-19 infection. After the person uses these items, you should wash them thoroughly with soap and water.  Wash laundry thoroughly Immediately remove and wash clothes or bedding that have blood, body fluids, and/or secretions or excretions, such as sweat, saliva, sputum, nasal mucus, vomit, urine, or feces, on them. Wear gloves when handling laundry from the patient. Read and follow directions on labels of laundry or clothing items and detergent. In general, wash and dry with the warmest temperatures recommended on the label.  Clean all areas the individual has used often Clean all touchable surfaces, such as counters, tabletops, doorknobs, bathroom fixtures, toilets, phones, keyboards, tablets, and bedside tables, every day. Also, clean any surfaces that may have blood, body fluids, and/or secretions or excretions on them. Wear gloves when cleaning surfaces the patient has come in contact with. Use a diluted bleach solution (e.g., dilute bleach with 1 part bleach and 10 parts water) or a household disinfectant with a label that says EPA-registered for coronaviruses. To make a bleach solution at home, add 1 tablespoon of bleach to 1 quart (4 cups) of water. For a larger supply, add  cup of bleach to 1 gallon (16 cups) of water. Read labels of cleaning products and follow recommendations provided on product labels. Labels contain instructions for safe and effective use of the cleaning product including precautions you should take when applying the product, such as wearing gloves or eye protection and making sure you have good ventilation during use of the product. Remove gloves and wash hands immediately after cleaning.  Monitor yourself for signs and symptoms of illness Caregivers and household members are considered close contacts, should monitor their health, and will be asked to limit movement outside of the home to the extent possible.  Follow the monitoring steps for close contacts listed on the symptom monitoring form.  If you have additional questions, contact your local health department or call the epidemiologist on call at [phone] (available 24/7). This guidance is subject to change. For the most up-to-date guidance from [facility], please refer to their website: TripMetro.hu   Increase activity slowly   Complete by: As directed    MyChart COVID-19 home monitoring program   Complete by: Dec 12, 2018    Is the patient willing to use the MyChart Mobile App for home monitoring?: Yes   Temperature monitoring   Complete by: Dec 12, 2018    After how many days would you like to receive a notification of this patient's flowsheet entries?: 1     Allergies as of 12/12/2018      Reactions   Amlodipine    Lower extremity swelling.  This reaction only occurred when taking GENERIC amlodipine.  She previously tolerated brand name Norvasc well.   Coconut Fatty Acids    Break out   Naproxen Sodium Nausea And Vomiting   Says she can take ibuprofen      Medication List    STOP taking these medications  acetaminophen-codeine 300-30 MG tablet Commonly known as: TYLENOL #3   metoprolol tartrate 50 MG tablet Commonly known as: LOPRESSOR     TAKE these medications   albuterol 108 (90 Base) MCG/ACT inhaler Commonly known as: VENTOLIN HFA Inhale 1-2 puffs every 6 (six) hours as needed into the lungs for wheezing or shortness of breath.   chlorthalidone 50 MG tablet Commonly known as: HYGROTON Take 1 tablet (50 mg total) by mouth daily.   dexamethasone 6 MG tablet Commonly known as: DECADRON Take 1 tablet (6 mg total) by mouth daily.   diclofenac sodium 1 % Gel Commonly known as: VOLTAREN Apply 4 g topically 2 (two) times daily as needed (back pain).   Ferrocite 324 (106 Fe) MG Tabs tablet Generic drug: Ferrous Fumarate Take 1 tablet (106 mg of iron  total) by mouth daily. What changed: how much to take   fluticasone 50 MCG/ACT nasal spray Commonly known as: FLONASE SPRAY 2 SPRAYS INTO EACH NOSTRIL EVERY DAY What changed: See the new instructions.   guaiFENesin 600 MG 12 hr tablet Commonly known as: MUCINEX Take 600 mg by mouth 2 (two) times daily as needed for cough or to loosen phlegm.   levothyroxine 50 MCG tablet Commonly known as: SYNTHROID TAKE 1 TABLET BY MOUTH EVERY DAY What changed:   how much to take  how to take this  when to take this  additional instructions   lisinopril 40 MG tablet Commonly known as: ZESTRIL Take 1 tablet (40 mg total) by mouth daily.   loratadine 10 MG tablet Commonly known as: CLARITIN Take 1 tablet (10 mg total) by mouth daily as needed for allergies.   metoprolol succinate 50 MG 24 hr tablet Commonly known as: TOPROL-XL Take 50 mg by mouth daily.            Durable Medical Equipment  (From admission, onward)         Start     Ordered   12/12/18 0927  For home use only DME oxygen  Once    Question Answer Comment  Length of Need 6 Months   Mode or (Route) Nasal cannula   Liters per Minute 3   Frequency Continuous (stationary and portable oxygen unit needed)   Oxygen delivery system Gas      12/12/18 0926         Follow-up Information    Care, [facility] Follow up.   Specialty: Home Health Services Why: A representative from [facility] will contact you to arrange start date and time for your therapy Contact information: [address] [place] [address] [phone]        Earl Lagos, MD. Schedule an appointment as soon as possible for a visit in 1 week(s).   Specialty: Internal Medicine Contact information: [address] [place] [address] [phone]          Allergies  Allergen Reactions  . Amlodipine     Lower extremity swelling.  This reaction only occurred when taking GENERIC  amlodipine.  She previously tolerated brand name Norvasc well.  . Coconut Fatty Acids     Break out  . Naproxen Sodium Nausea And Vomiting    Says she can take ibuprofen     Consultations:  None  Procedures/Studies: Dg Chest 2 View  Result Date: 12/04/2018 CLINICAL DATA:  Coughing congestion. EXAM: CHEST - 2 VIEW COMPARISON:  03/22/2018 FINDINGS: Two-view exam shows patchy ground-glass opacities in both lungs, right greater  than left. No associated pleural effusion. Cardiopericardial silhouette is at upper limits of normal for size. The visualized bony structures of the thorax are intact. IMPRESSION: Patchy bilateral ground-glass opacities, right greater than left without pleural effusion. Imaging features raise concern for atypical infection, including viral pneumonia. I personally called these results to Dr. Juleen China in the emergency department at approximately 1012 hours on 12/04/2018. Electronically Signed   By: Kennith Center M.D.   On: 12/04/2018 10:13   Dg Chest Port 1 View  Result Date: 12/08/2018 CLINICAL DATA:  Shortness of breath EXAM: PORTABLE CHEST 1 VIEW COMPARISON:  12/04/2018 FINDINGS: Stable cardiomediastinal contours. Persistent patchy bilateral airspace opacities throughout both lungs, most confluent within the right upper lobe. There may be slight improved aeration within the right upper lobe, otherwise little change. No pleural effusion or pneumothorax. IMPRESSION: Slight improved aeration of the right upper lobe. Multifocal airspace opacities compatible with acute viral pneumonia. Electronically Signed   By: Duanne Guess M.D.   On: 12/08/2018 10:40      Subjective: Feels well, desperately wants to go home. Will wear O2 as indicated, will follow up with PCP. Has had no recent fevers, denies shortness of breath at rest, having moderate SOB with exertion which she states is not changed from her premorbid baseline. No chest pain or other concerns. Eating/drinking well.    Discharge Exam: Vitals:   12/12/18 0454 12/12/18 0741  BP: 134/66 131/75  Pulse: 64 (!) 59  Resp: (!) 22 20  Temp: 98.8 F (37.1 C) 98.3 F (36.8 C)  SpO2: 96% 97%   General: Pt is alert, awake, not in acute distress Cardiovascular: RRR, S1/S2 +, no rubs, no gallops Respiratory: CTA bilaterally, no wheezing, no rhonchi Abdominal: Soft, NT, ND, bowel sounds + Extremities: No edema, no cyanosis  Labs: BNP (last 3 results) No results for input(s): BNP in the last 8760 hours. Basic Metabolic Panel: Recent Labs  Lab 12/07/18 0425 12/08/18 0308 12/09/18 0213 12/10/18 0145 12/11/18 0207  NA 132* 132* 135 134* 135  K 4.3 4.3 4.2 4.5 4.8  CL 97* 97* 97* 98 98  CO2 27 30 28 27 29   GLUCOSE 108* 133* 143* 142* 155*  BUN 38* 30* 24* 25* 26*  CREATININE 0.97 0.94 0.89 0.85 0.93  CALCIUM 7.9* 8.1* 8.3* 8.2* 8.4*   Liver Function Tests: Recent Labs  Lab 12/06/18 0240 12/07/18 0425 12/08/18 0308 12/09/18 0213 12/10/18 0145  AST 27 25 24 24 28   ALT 12 11 11 13 15   ALKPHOS 54 56 58 58 57  BILITOT 0.4 0.5 0.2* 0.3 0.2*  PROT 7.4 7.0 6.7 6.9 6.3*  ALBUMIN 3.0* 2.8* 2.4* 2.5* 2.4*   No results for input(s): LIPASE, AMYLASE in the last 168 hours. No results for input(s): AMMONIA in the last 168 hours. CBC: Recent Labs  Lab 12/07/18 0425 12/08/18 0308 12/09/18 0213 12/10/18 0145 12/11/18 0207  WBC 13.8* 10.6* 12.0* 12.7* 11.3*  NEUTROABS  --   --   --  10.3* 9.3*  HGB 9.3* 9.4* 9.4* 9.0* 9.3*  HCT 29.6* 29.1* 29.3* 28.3* 29.2*  MCV 76.7* 75.0* 74.9* 74.7* 75.3*  PLT 334 375 448* 488* 537*   Cardiac Enzymes: No results for input(s): CKTOTAL, CKMB, CKMBINDEX, TROPONINI in the last 168 hours. BNP: Invalid input(s): POCBNP CBG: No results for input(s): GLUCAP in the last 168 hours. D-Dimer Recent Labs    12/10/18 0145 12/11/18 0207  DDIMER 1.26* 1.36*   Hgb A1c No results for input(s): HGBA1C in  the last 72 hours. Lipid Profile No results for input(s):  CHOL, HDL, LDLCALC, TRIG, CHOLHDL, LDLDIRECT in the last 72 hours. Thyroid function studies No results for input(s): TSH, T4TOTAL, T3FREE, THYROIDAB in the last 72 hours.  Invalid input(s): FREET3 Anemia work up Recent Labs    12/10/18 0145  FERRITIN 304   Urinalysis    Component Value Date/Time   COLORURINE YELLOW 11/14/2014 2150   APPEARANCEUR Clear 11/22/2015 1054   LABSPEC 1.014 11/14/2014 2150   PHURINE 6.5 11/14/2014 2150   GLUCOSEU Negative 11/22/2015 1054   GLUCOSEU NEG mg/dL 40/98/1191 4782   HGBUR NEGATIVE 11/14/2014 2150   HGBUR negative 07/29/2006 0856   BILIRUBINUR Negative 11/22/2015 1054   KETONESUR NEGATIVE 11/14/2014 2150   PROTEINUR Negative 11/22/2015 1054   PROTEINUR NEGATIVE 11/14/2014 2150   UROBILINOGEN 0.2 11/22/2015 1051   UROBILINOGEN 1.0 11/14/2014 2150   NITRITE Negative 11/22/2015 1054   NITRITE NEGATIVE 11/14/2014 2150   LEUKOCYTESUR 2+ (A) 11/22/2015 1054    Microbiology Recent Results (from the past 240 hour(s))  SARS Coronavirus 2 [facility] order, Performed in [facility] hospital lab) Nasopharyngeal Nasopharyngeal Swab     Status: Abnormal   Collection Time: 12/04/18 10:50 AM   Specimen: Nasopharyngeal Swab  Result Value Ref Range Status   SARS Coronavirus 2 POSITIVE (A) NEGATIVE Final    Comment: CRITICAL RESULT CALLED TO, READ BACK BY AND VERIFIED WITH: RN DEASHA  [id number] FCP (NOTE) If result is NEGATIVE SARS-CoV-2 target nucleic acids are NOT DETECTED. The SARS-CoV-2 RNA is generally detectable in upper and lower  respiratory specimens during the acute phase of infection. The lowest  concentration of SARS-CoV-2 viral copies this assay can detect is 250  copies / mL. A negative result does not preclude SARS-CoV-2 infection  and should not be used as the sole basis for treatment or other  patient management decisions.  A negative result may occur with  improper specimen collection / handling, submission of specimen other  than  nasopharyngeal swab, presence of viral mutation(s) within the  areas targeted by this assay, and inadequate number of viral copies  (<250 copies / mL). A negative result must be combined with clinical  observations, patient history, and epidemiological information. If result is POSITIVE SARS-CoV-2 target nucleic acids are DETECTED. T he SARS-CoV-2 RNA is generally detectable in upper and lower  respiratory specimens during the acute phase of infection.  Positive  results are indicative of active infection with SARS-CoV-2.  Clinical  correlation with patient history and other diagnostic information is  necessary to determine patient infection status.  Positive results do  not rule out bacterial infection or co-infection with other viruses. If result is PRESUMPTIVE POSTIVE SARS-CoV-2 nucleic acids MAY BE PRESENT.   A presumptive positive result was obtained on the submitted specimen  and confirmed on repeat testing.  While 2019 novel coronavirus  (SARS-CoV-2) nucleic acids may be present in the submitted sample  additional confirmatory testing may be necessary for epidemiological  and / or clinical management purposes  to differentiate between  SARS-CoV-2 and other Sarbecovirus currently known to infect humans.  If clinically indicated additional testing with an alternate test  methodology [phone]) is  advised. The SARS-CoV-2 RNA is generally  detectable in upper and lower respiratory specimens during the acute  phase of infection. The expected result is Negative. Fact Sheet for Patients:  BoilerBrush.com.cy Fact Sheet for Healthcare Providers: https://pope.com/ This test is not yet approved or cleared by the [place] FDA and has  been authorized for detection and/or diagnosis of SARS-CoV-2 by FDA under an Emergency Use Authorization (EUA).  This EUA will remain in effect (meaning this test can be used) for the duration of  the COVID-19 declaration under Section 564(b)(1) of the Act, 21 U.S.C. section 360bbb-3(b)(1), unless the authorization is terminated or revoked sooner. Performed at [facility], [address] [place], [place] [address]   Blood Culture (routine x 2)     Status: None   Collection Time: 12/04/18 12:20 PM   Specimen: BLOOD RIGHT FOREARM  Result Value Ref Range Status   Specimen Description BLOOD RIGHT FOREARM  Final   Special Requests   Final    BOTTLES DRAWN AEROBIC AND ANAEROBIC Blood Culture adequate volume   Culture   Final    NO GROWTH 5 DAYS Performed at [facility], [address] [place], [place] [address]    Report Status 12/09/2018 FINAL  Final  Blood Culture (routine x 2)     Status: None   Collection Time: 12/04/18  1:19 PM   Specimen: BLOOD  Result Value Ref Range Status   Specimen Description BLOOD LEFT ANTECUBITAL  Final   Special Requests   Final    BOTTLES DRAWN AEROBIC AND ANAEROBIC Blood Culture adequate volume   Culture   Final    NO GROWTH 5 DAYS Performed at [facility], [address] [place], [place] [address]    Report Status 12/09/2018 FINAL  Final  Expectorated sputum assessment w rflx to resp cult     Status: None   Collection Time: 12/09/18  2:10 PM   Specimen: Sputum  Result Value Ref Range Status   Specimen Description SPUTUM  Final   Special Requests NONE  Final   Sputum evaluation   Final    THIS SPECIMEN IS ACCEPTABLE FOR SPUTUM CULTURE Performed at [facility], [address] [place], [place] [address]    Report Status 12/09/2018 FINAL  Final  Culture, respiratory     Status: None (Preliminary result)   Collection Time: 12/09/18  2:10 PM   Specimen: SPU  Result Value Ref Range Status   Specimen Description   Final    SPUTUM Performed at [facility], [address] [place], [place] [address]    Special Requests   Final    NONE Reflexed from [id number] Performed at [facility], [address] [place], [place] [address]    Gram Stain   Final    RARE WBC PRESENT, PREDOMINANTLY PMN RARE GRAM POSITIVE COCCI RARE GRAM NEGATIVE RODS RARE GRAM POSITIVE RODS    Culture   Final    CULTURE REINCUBATED FOR BETTER GROWTH Performed at [facility], [address] [place], [place] [address]    Report Status PENDING  Incomplete    Time coordinating discharge: Approximately 40 minutes  Tyrone Nineyan B Grunz, MD  [facility] 12/12/2018, 9:40 AM

## 2018-12-12 NOTE — Progress Notes (Signed)
Reviewed all discharge instructions, quarantine information, medications, and education with the pt. She verbalized an understanding of all information. Her husband has arrived and she was assisted out with her oxygen tank to their personal vehicle. She left the facility in stable condition at this time.

## 2018-12-12 NOTE — TOC Transition Note (Signed)
Transition of Care [facility]) - CM/SW Discharge Note   Patient Details  Name: Nancy [name] MRN: [id number] Date of Birth: 1955-10-06  Transition of Care [facility]) CM/SW Contact:  Ninfa Meeker, RN Phone Number:  [phone] (working remotely) 12/12/2018, 11:00 AM   Clinical Narrative:   Patient has been setup with [facility] on last week. Case manager has called Learta Codding with [facility] to arrange for delivery of concentrator and Tanks to patients home. [facility] has been contacted for tank to be delivered to patients room for transport home. No further needs identified. Patients husband will transport her home.    Final next level of care: [place] Barriers to Discharge: No Barriers Identified   Patient Goals and CMS Choice Patient states their goals for this hospitalization and ongoing recovery are:: get better   Choice offered to / list presented to : Spouse  Discharge Placement                       Discharge Plan and Services   Discharge Planning Services: CM Consult Post Acute Care Choice: Home Health          DME Arranged: Oxygen   Date DME Agency Contacted: 12/12/18 Time DME Agency Contacted: [id number] Representative spoke with at DME Agency: Learta Codding HH Arranged: PT, OT [facility] Agency: [place] Date [place]: 12/09/18 Time [place]: 6283 Representative spoke with at [place]: [place] ([place]) Interventions     Readmission Risk Interventions No flowsheet data found.

## 2019-05-18 ENCOUNTER — Ambulatory Visit: Payer: Medicare HMO | Attending: Internal Medicine

## 2019-05-18 DIAGNOSIS — Z23 Encounter for immunization: Secondary | ICD-10-CM

## 2019-05-18 NOTE — Progress Notes (Signed)
   Covid-19 Vaccination Clinic  Name:  Nancy [name]    MRN: [id number] DOB: 04/01/1955  05/18/2019  Nancy [name] was observed post Covid-19 immunization for 15 minutes without incident. She was provided with Vaccine Information Sheet and instruction to access the V-Safe system.   Nancy [name] was instructed to call 911 with any severe reactions post vaccine: Marland Kitchen Difficulty breathing  . Swelling of face and throat  . A fast heartbeat  . A bad rash all over body  . Dizziness and weakness   Immunizations Administered    Name Date Dose VIS Date Route   Pfizer COVID-19 Vaccine 05/18/2019  8:34 AM 0.3 mL 02/17/2019 Intramuscular   Manufacturer: [employer], [employer]   Lot: [id number]   NDC: [address]-5

## 2019-06-12 ENCOUNTER — Ambulatory Visit: Payer: Medicare HMO | Attending: Internal Medicine

## 2019-06-12 DIAGNOSIS — Z23 Encounter for immunization: Secondary | ICD-10-CM

## 2019-06-12 NOTE — Progress Notes (Signed)
   Covid-19 Vaccination Clinic  Name:  Nancy [name]    MRN: [id number] DOB: 13-Sep-1955  06/12/2019  [name] was observed post Covid-19 immunization for 15 minutes without incident. She was provided with Vaccine Information Sheet and instruction to access the V-Safe system.   [name] was instructed to call 911 with any severe reactions post vaccine: Marland Kitchen Difficulty breathing  . Swelling of face and throat  . A fast heartbeat  . A bad rash all over body  . Dizziness and weakness   Immunizations Administered    Name Date Dose VIS Date Route   Pfizer COVID-19 Vaccine 06/12/2019  8:20 AM 0.3 mL 02/17/2019 Intramuscular   Manufacturer: [employer], [employer]   Lot: [id number]   NDC: [address]-5

## 2019-08-09 ENCOUNTER — Other Ambulatory Visit: Payer: Self-pay | Admitting: Otolaryngology

## 2019-08-09 DIAGNOSIS — H903 Sensorineural hearing loss, bilateral: Secondary | ICD-10-CM | POA: Insufficient documentation

## 2019-09-06 ENCOUNTER — Other Ambulatory Visit: Payer: Self-pay

## 2019-09-06 ENCOUNTER — Encounter (HOSPITAL_COMMUNITY): Payer: Self-pay | Admitting: *Deleted

## 2019-09-06 ENCOUNTER — Emergency Department (HOSPITAL_COMMUNITY)
Admission: EM | Admit: 2019-09-06 | Discharge: 2019-09-06 | Disposition: A | Discharge status: Home / Self Care | Payer: Medicare HMO | Attending: Emergency Medicine | Admitting: Emergency Medicine

## 2019-09-06 DIAGNOSIS — Z5321 Procedure and treatment not carried out due to patient leaving prior to being seen by health care provider: Secondary | ICD-10-CM | POA: Diagnosis not present

## 2019-09-06 DIAGNOSIS — R109 Unspecified abdominal pain: Secondary | ICD-10-CM | POA: Insufficient documentation

## 2019-09-06 LAB — CBC
HCT: 32.7 % — ABNORMAL LOW (ref 36.0–46.0)
Hemoglobin: 9.9 g/dL — ABNORMAL LOW (ref 12.0–15.0)
MCH: 23.2 pg — ABNORMAL LOW (ref 26.0–34.0)
MCHC: 30.3 g/dL (ref 30.0–36.0)
MCV: 76.8 fL — ABNORMAL LOW (ref 80.0–100.0)
Platelets: 286 10*3/uL (ref 150–400)
RBC: 4.26 MIL/uL (ref 3.87–5.11)
RDW: 14.6 % (ref 11.5–15.5)
WBC: 7.5 10*3/uL (ref 4.0–10.5)
nRBC: 0 % (ref 0.0–0.2)

## 2019-09-06 LAB — COMPREHENSIVE METABOLIC PANEL WITH GFR
ALT: 8 U/L (ref 0–44)
AST: 14 U/L — ABNORMAL LOW (ref 15–41)
Albumin: 3.3 g/dL — ABNORMAL LOW (ref 3.5–5.0)
Alkaline Phosphatase: 52 U/L (ref 38–126)
Anion gap: 9 (ref 5–15)
BUN: 7 mg/dL — ABNORMAL LOW (ref 8–23)
CO2: 26 mmol/L (ref 22–32)
Calcium: 8.7 mg/dL — ABNORMAL LOW (ref 8.9–10.3)
Chloride: 97 mmol/L — ABNORMAL LOW (ref 98–111)
Creatinine, Ser: 1.06 mg/dL — ABNORMAL HIGH (ref 0.44–1.00)
GFR calc Af Amer: >60 mL/min
GFR calc non Af Amer: 55 mL/min — ABNORMAL LOW
Glucose, Bld: 106 mg/dL — ABNORMAL HIGH (ref 70–99)
Potassium: 3.8 mmol/L (ref 3.5–5.1)
Sodium: 132 mmol/L — ABNORMAL LOW (ref 135–145)
Total Bilirubin: 1.1 mg/dL (ref 0.3–1.2)
Total Protein: 6.9 g/dL (ref 6.5–8.1)

## 2019-09-06 LAB — LIPASE, BLOOD: Lipase: 29 U/L (ref 11–51)

## 2019-09-06 MED ORDER — SODIUM CHLORIDE 0.9% FLUSH: 3.0000 mL | Freq: Once | INTRAVENOUS | Status: DC

## 2019-09-06 NOTE — ED Notes (Signed)
Pt lwbs 

## 2019-09-06 NOTE — ED Triage Notes (Signed)
Pt reports severe left side abd pain since Monday. Initially had diarrhea but that has resolved. Denies n/v, fever or urinary symptoms.

## 2019-09-09 ENCOUNTER — Other Ambulatory Visit: Payer: Self-pay

## 2019-09-09 ENCOUNTER — Ambulatory Visit
Admission: RE | Admit: 2019-09-09 | Discharge: 2019-09-09 | Disposition: A | Discharge status: Home / Self Care | Payer: Medicare HMO | Source: Ambulatory Visit | Attending: Otolaryngology | Admitting: Otolaryngology

## 2019-09-09 DIAGNOSIS — H903 Sensorineural hearing loss, bilateral: Secondary | ICD-10-CM

## 2020-08-29 ENCOUNTER — Other Ambulatory Visit: Payer: Self-pay

## 2020-08-29 ENCOUNTER — Ambulatory Visit: Payer: Medicare HMO | Attending: Physician Assistant | Admitting: Audiology

## 2020-08-29 DIAGNOSIS — H903 Sensorineural hearing loss, bilateral: Secondary | ICD-10-CM | POA: Diagnosis present

## 2020-08-29 DIAGNOSIS — H9313 Tinnitus, bilateral: Secondary | ICD-10-CM | POA: Diagnosis present

## 2020-08-29 NOTE — Procedures (Signed)
  Outpatient Audiology and [facility] [address] [place], [place]  [address] [phone]  AUDIOLOGICAL  EVALUATION  NAME: [name]     DOB:   1956-02-26      MRN: [id number]                                                                                     DATE: 08/29/2020     REFERENT: Earl Lagos, MD STATUS: Outpatient DIAGNOSIS: Sensorineural hearing loss, bilateral, tinnitus   History: Nancy [name] was seen for an audiological evaluation. She notes decreased hearing occurring in both ears for over a year. Krissie was diagnosed last year with a moderate to moderately-severe sensorineural hearing loss in the right ear and a moderately severe flat sensorineural loss in the left ear. She is followed by Dr. Jenne Pane, an Otolaryngologist, for her known asymmetric sensorineural hearing loss. She reports bothersome tinnitus. She denies otalgia, aural fullness, and vertigo. Delinda reports continued difficulty communicating with family and friends.   Evaluation:  Otoscopy showed a clear view of the tympanic membranes, bilaterally Tympanometry results were consistent in the right ear with negative middle ear pressure and normal tympanic membrane mobility and in the left ear with normal middle ear pressure and normal tympanic membrane mobility.  Audiometric testing was completed using Conventional Audiometry techniques with insert earphones and TDH headphones. Test results are consistent with a moderately-severe to profound sensorineural hearing loss int he right ear and a severe to profound sensorineural hearing loss in the left ear. Sensorineural asymmetry noted at [phone] Hz, worse in the right ear. Speech Recognition Thresholds were obtained at 80 dB HL in the right ear and at 95 dB HL in the left ear. Word Recognition Testing was completed at 100 dB HL in the right ear and Corrina scored 96%. Testing was completed at 110 dB HL in the left ear and Zaharah scored 50%. Asymmetric word  recognition noted, worse in the left ear.   Results:  Today's results are consistent with a moderately-severe to profound sensorineural hearing loss int he right ear and a severe to profound sensorineural hearing loss in the left ear. Sensorineural asymmetry noted at [phone] Hz and asymmetric word recognition noted, worse in the right ear.  The results and recommendations were reviewed with Bonita Quin. Shermaine will have communication difficulty in all listening environments and will benefit from the use of good communication strategies and the use of amplification.   Recommendations: Follow up with ENT for asymmetric hearing loss, worse in the left ear.  Communication Needs Assessment with an Audiologist to discuss amplification.      Marton Redwood Audiologist, Au.D., CCC-A 08/29/2020  11:30 AM  Cc: Earl Lagos, MD

## 2020-12-16 ENCOUNTER — Other Ambulatory Visit: Payer: Self-pay

## 2020-12-16 ENCOUNTER — Encounter: Payer: Self-pay | Admitting: Internal Medicine

## 2020-12-16 ENCOUNTER — Ambulatory Visit (INDEPENDENT_AMBULATORY_CARE_PROVIDER_SITE_OTHER): Payer: Medicare HMO | Admitting: Internal Medicine

## 2020-12-16 ENCOUNTER — Telehealth: Payer: Self-pay

## 2020-12-16 VITALS — BP 165/81 | HR 73 | Temp 98.5°F | Ht 63.0 in | Wt 249.8 lb

## 2020-12-16 DIAGNOSIS — Z23 Encounter for immunization: Secondary | ICD-10-CM

## 2020-12-16 DIAGNOSIS — M722 Plantar fascial fibromatosis: Secondary | ICD-10-CM

## 2020-12-16 DIAGNOSIS — N3946 Mixed incontinence: Secondary | ICD-10-CM | POA: Insufficient documentation

## 2020-12-16 DIAGNOSIS — I10 Essential (primary) hypertension: Secondary | ICD-10-CM

## 2020-12-16 DIAGNOSIS — G4733 Obstructive sleep apnea (adult) (pediatric): Secondary | ICD-10-CM | POA: Diagnosis not present

## 2020-12-16 DIAGNOSIS — D509 Iron deficiency anemia, unspecified: Secondary | ICD-10-CM

## 2020-12-16 DIAGNOSIS — M545 Low back pain, unspecified: Secondary | ICD-10-CM | POA: Diagnosis not present

## 2020-12-16 DIAGNOSIS — Z6841 Body Mass Index (BMI) 40.0 and over, adult: Secondary | ICD-10-CM

## 2020-12-16 DIAGNOSIS — Z1231 Encounter for screening mammogram for malignant neoplasm of breast: Secondary | ICD-10-CM | POA: Insufficient documentation

## 2020-12-16 DIAGNOSIS — Z862 Personal history of diseases of the blood and blood-forming organs and certain disorders involving the immune mechanism: Secondary | ICD-10-CM

## 2020-12-16 DIAGNOSIS — Z0000 Encounter for general adult medical examination without abnormal findings: Secondary | ICD-10-CM | POA: Diagnosis not present

## 2020-12-16 DIAGNOSIS — E039 Hypothyroidism, unspecified: Secondary | ICD-10-CM

## 2020-12-16 DIAGNOSIS — G8929 Other chronic pain: Secondary | ICD-10-CM

## 2020-12-16 DIAGNOSIS — R351 Nocturia: Secondary | ICD-10-CM | POA: Diagnosis not present

## 2020-12-16 HISTORY — DX: Plantar fascial fibromatosis: M72.2

## 2020-12-16 LAB — POCT GLYCOSYLATED HEMOGLOBIN (HGB A1C): Hemoglobin A1C: 5.3 % (ref 4.0–5.6)

## 2020-12-16 LAB — GLUCOSE, CAPILLARY: Glucose-Capillary: 96 mg/dL (ref 70–99)

## 2020-12-16 MED ORDER — DICLOFENAC SODIUM 1 % EX GEL: 4.0000 g | Freq: Four times a day (QID) | CUTANEOUS | 0 refills | Status: DC | PRN

## 2020-12-16 MED ORDER — ACETAMINOPHEN 500 MG PO TABS: 1000.0000 mg | ORAL_TABLET | Freq: Three times a day (TID) | ORAL | 0 refills | Status: DC | PRN

## 2020-12-16 NOTE — Patient Instructions (Addendum)
For foot pain, you may use acetaminophen 1000 mg up to three times per day as needed. Do NOT take if you also take hydrocodone-acetaminophen. Please also complete stretches as we discussed.   Please monitor your blood pressure at home, keep a record, and bring to your next appointment.

## 2020-12-16 NOTE — Telephone Encounter (Signed)
DECISION :   DICLOFENAC SODIUM Gel  Type of coverage approved: Prior Authorization  This approval authorizes your coverage from 03/09/2020 - 03/08/2021, unless we notify you  otherwise,

## 2020-12-16 NOTE — Assessment & Plan Note (Signed)
Overdue for mammogram, will order today. Family history of breast cancer and ovarian cancer discussed today. Patient denies history of genetic screening in herself or family members, may continue to discuss.

## 2020-12-16 NOTE — Addendum Note (Signed)
Addended by: Maura Crandall on: 12/16/2020 01:41 PM   Modules accepted: Orders

## 2020-12-16 NOTE — Assessment & Plan Note (Addendum)
Chronic low back pain which is stable per patient report today. She does take hydrocodone-acetaminophen as needed, usually once/week. Previously on medication contract with our clinic, no refills needed today. Will need to address contract if patient requires future refills.

## 2020-12-16 NOTE — Addendum Note (Signed)
Addended by: Dickie La on: 12/16/2020 02:22 PM   Modules accepted: Orders

## 2020-12-16 NOTE — Assessment & Plan Note (Signed)
Flu and Prevnar given today. Discussed obtaining COVID booster and Shingles at pharmacy. Last  Pap in 2013, will need to discuss continued screening. Discuss DEXA and Tdap at future visit. UTD on CSY.

## 2020-12-16 NOTE — Assessment & Plan Note (Signed)
Presenting with foot pain off/on for the past two months. Worse on the plantar surface near the heel, worst when first gets up on the foot. No trauma or injury to the area. Exam with significant tenderness over medial calcaneal plantar surface. Suspect plantar fasciitis. Discussed conservative management with stretches and rolling out of the fascia. Would like to avoid NSAIDs given other comorbidities, advised Tylenol okay to use although avoid when using other acetaminophen- containing products. Also reporting some mild left ankle discomfort which I suspect is due to abnormal gait from plantar fasciitis. Advised to wear more supportive shoes when ambulating around the house. Follow-up in four weeks to reassess.

## 2020-12-16 NOTE — Assessment & Plan Note (Signed)
Above goal in clinic today on initial and recheck. Patient reports previous provider took her off blood pressure medications other than metoprolol, unknown reason. She is reporting pain in her foot after getting up on it this morning in clinic. Patient reports home pressures are "normal". Discussed regular home monitoring with record and return for review. Repeat BMP today.

## 2020-12-16 NOTE — Assessment & Plan Note (Signed)
Continues on levothyroxine 50 mcg, no recent TSH/fT4 levels available for review.  -Continue current dosage, check TSH/fT4 today

## 2020-12-16 NOTE — Telephone Encounter (Signed)
Copy sent to pharmacy  

## 2020-12-16 NOTE — Telephone Encounter (Signed)
PA  for pt diclofenac SOD 1 % gel came through on cover my meds was done and sent back through to [facility] awaiting approval or denial    NOTE AS FOLLOWS :    Your information has been submitted to [employer]. [employer] will review the request and will issue a decision, typically within 1-3 days from your submission. You can check the updated outcome later by reopening this request.

## 2020-12-16 NOTE — Assessment & Plan Note (Addendum)
Reporting nocturia for the past month. Noting she is drinking a lot of tea prior to bedtime. Denies polyuria or dysuria. Given comorbidities will rule out diabetes as possible cause and check UA. Inadequately treated OSA also a possibility, addressed adherence to CPAP. Continue to monitor.

## 2020-12-16 NOTE — Assessment & Plan Note (Signed)
History of microcytic anemia, most likely 2/2 iron deficiency. Patient not currently on supplementation. Last Hgb 9.9 in 2021. Will recheck today.

## 2020-12-16 NOTE — Progress Notes (Signed)
Subjective:   Patient ID: Nancy [name] female   DOB: [birth year] 65 y.o.   MRN: [id number]  HPI: [name] is a 65 y.o. with history of HTN, OSA, hypothyroidism, chronic low back and knee pain, ?Sjogren's who presents to re-establish care and discuss left foot pain. She is accompanied by her daughter.  [name] was last seen in our clinic 03/2018. She reports being seen by [name] at [facility] since that time, she wishes to return to [facility] for financial reasons.  Please see Encounters for problem-based charting.  Past Medical History:  Diagnosis Date   Acute respiratory failure with hypoxia (HCC) 12/05/2018   AKI (acute kidney injury) (HCC) 12/04/2018   Back pain    chronic low back pain L4-5 discectomy:11/99. degenerative thoracic spondylotic changes 4/07   Chest pain    neg adenosine myoview.   Hypertension    no LVH EKG-7/05, nl M/C ratio 4/07   Hypothyroidism    09/1998   Lower extremity edema    echo EF 55-65% w/o evidence of Dias dysfx.,    Menorrhagia    Microcytic anemia    09/1998. Hb-10.5/MCV 76. needs ferritin to determine ACD vs IDA   Morbid obesity (HCC)    Obstructive sleep apnea    severe 7/05 RD 161 per hr. /CPAP 18cwp   Pneumonia due to COVID-19 virus 12/04/2018   Polyarthralgia    (knee/back/ankle) w/dx of fibromyalgia? given by [facility] rheum, knee pain chronic 2/2 obesity, fibromyalgia and Sjogren's.   Primary Sjogren's syndrome (HCC)    anti Ro+;ANA>1:1280 in homogen pattern, negative ds DNA/RF/anti Smith/RNP/C3-4 comp/la/jo1/Scleroderma/centromere, neg HIV/ACE/Hep B/C, nl CXR 2/05, schirmer salivary glandtest not done, symptom rx:eye drops/prednisone/plaquenil referral to [facility] rheum, [name] 3/07. saw [name] but stopped 2/2 cost.   Uterine bleeding    Viral URI with cough 11/03/2017   Current Outpatient Medications  Medication Sig Dispense Refill   acetaminophen (TYLENOL) 500 MG tablet Take 2 tablets (1,000 mg total) by  mouth every 8 (eight) hours as needed for moderate pain. 180 tablet 0   diclofenac Sodium (VOLTAREN) 1 % GEL Apply 4 g topically every 6 (six) hours as needed (arthritis pain). 150 g 0   HYDROcodone-acetaminophen (NORCO) 7.5-325 MG tablet Take 1 tablet by mouth every 8 (eight) hours as needed.     levothyroxine (EUTHYROX) 50 MCG tablet Take 50 mcg by mouth daily.     metoprolol succinate (TOPROL-XL) 50 MG 24 hr tablet Take 50 mg by mouth daily. Take with or immediately following a meal.     albuterol (PROVENTIL HFA;VENTOLIN HFA) 108 (90 Base) MCG/ACT inhaler Inhale 1-2 puffs every 6 (six) hours as needed into the lungs for wheezing or shortness of breath. 1 Inhaler 0   No current facility-administered medications for this visit.   Family History  Problem Relation Age of Onset   Hypertension Mother    Cancer Mother    Cancer Father    Cancer Sister    Ovarian cancer Other        family history   Breast cancer Other        family history in 1st degree relatives.   Thyroid disease Sister    Colon cancer Neg Hx    Stomach cancer Neg Hx    Social History   Socioeconomic History   Marital status: Married    Spouse name: Not on file   Number of children: Not on file   Years of education: Not on file   Highest  education level: Not on file  Occupational History   Occupation: house wife    Employer: UNEMPLOYED  Tobacco Use   Smoking status: Never   Smokeless tobacco: Never  Substance and Sexual Activity   Alcohol use: No    Alcohol/week: 0.0 standard drinks   Drug use: No   Sexual activity: Not on file  Other Topics Concern   Not on file  Social History Narrative   Her daughter Nancy [name]) often comes with her to visits. She also has a grand daughter and great grand daughter(whose name is Nancy [name])   Social Determinants of Corporate investment banker Strain: Not on file  Food Insecurity: Not on file  Transportation Needs: Not on file  Physical Activity: Not on file  Stress: Not  on file  Social Connections: Not on file   Review of Systems: Pertinent items are noted in HPI. Objective:  Physical Exam: Vitals:   12/16/20 0849 12/16/20 0947  BP: (!) 172/82 (!) 165/81  Pulse: 84 73  Temp: 98.5 F (36.9 C)   TempSrc: Oral   SpO2: 97%   Weight: 249 lb 12.8 oz (113.3 kg)   Height: 5\' 3"  (1.6 m)    General appearance: alert, cooperative, and no distress Lungs: clear to auscultation bilaterally Heart: regular rate and rhythm, S1, S2 normal, no murmur, click, rub or gallop Extremities: bilateral ankles with 1+ edema; left foot with significant TTP over plantar fascia insertion to medial calcaneus; mild TTP over left lateral ankle; no deformity notable Neurologic: Mental status: Alert, oriented, thought content appropriate; no focal deficits  Assessment & Plan:   See Encounters tab for problem-based charting.   Return in about 4 weeks (around 01/13/2021) for f/u BP.  13/09/2020, MD

## 2020-12-16 NOTE — Assessment & Plan Note (Signed)
Patient states she uses her CPAP intermittently for the past month, needs new supplies such as tubing, mask, etc. Will attempt to help her obtain new supplies. Encouraged continued use as untreated OSA may be contributing to HTN, as well as nocturia.

## 2020-12-17 LAB — URINALYSIS, ROUTINE W REFLEX MICROSCOPIC
Bilirubin, UA: NEGATIVE
Glucose, UA: NEGATIVE
Ketones, UA: NEGATIVE
Leukocytes,UA: NEGATIVE
Nitrite, UA: NEGATIVE
Protein,UA: NEGATIVE
RBC, UA: NEGATIVE
Specific Gravity, UA: 1.021 (ref 1.005–1.030)
Urobilinogen, Ur: 1 mg/dL (ref 0.2–1.0)
pH, UA: 6 (ref 5.0–7.5)

## 2020-12-17 NOTE — Addendum Note (Signed)
Addended by: Dickie La on: 12/17/2020 11:39 AM   Modules accepted: Orders

## 2020-12-20 LAB — CMP14 + ANION GAP
ALT: <5 [IU]/L (ref 0–32)
AST: 12 [IU]/L (ref 0–40)
Albumin/Globulin Ratio: 1.3 (ref 1.2–2.2)
Albumin: 4 g/dL (ref 3.8–4.8)
Alkaline Phosphatase: 66 [IU]/L (ref 44–121)
Anion Gap: 14 mmol/L (ref 10.0–18.0)
BUN/Creatinine Ratio: 12 (ref 12–28)
BUN: 11 mg/dL (ref 8–27)
Bilirubin Total: 0.6 mg/dL (ref 0.0–1.2)
CO2: 23 mmol/L (ref 20–29)
Calcium: 8.8 mg/dL (ref 8.7–10.3)
Chloride: 103 mmol/L (ref 96–106)
Creatinine, Ser: 0.92 mg/dL (ref 0.57–1.00)
Globulin, Total: 3.2 g/dL (ref 1.5–4.5)
Glucose: 98 mg/dL (ref 70–99)
Potassium: 5 mmol/L (ref 3.5–5.2)
Sodium: 140 mmol/L (ref 134–144)
Total Protein: 7.2 g/dL (ref 6.0–8.5)
eGFR: 68 mL/min/{1.73_m2}

## 2020-12-20 LAB — CBC
Hematocrit: 33.8 % — ABNORMAL LOW (ref 34.0–46.6)
Hemoglobin: 10.4 g/dL — ABNORMAL LOW (ref 11.1–15.9)
MCH: 23.5 pg — ABNORMAL LOW (ref 26.6–33.0)
MCHC: 30.8 g/dL — ABNORMAL LOW (ref 31.5–35.7)
MCV: 77 fL — ABNORMAL LOW (ref 79–97)
Platelets: 268 10*3/uL (ref 150–450)
RBC: 4.42 x10E6/uL (ref 3.77–5.28)
RDW: 15.8 % — ABNORMAL HIGH (ref 11.7–15.4)
WBC: 5.3 10*3/uL (ref 3.4–10.8)

## 2020-12-20 LAB — IRON,TIBC AND FERRITIN PANEL
Ferritin: 104 ng/mL (ref 15–150)
Iron Saturation: 30 % (ref 15–55)
Iron: 87 ug/dL (ref 27–139)
Total Iron Binding Capacity: 288 ug/dL (ref 250–450)
UIBC: 201 ug/dL (ref 118–369)

## 2020-12-20 LAB — TSH+FREE T4
Free T4: 0.89 ng/dL (ref 0.82–1.77)
TSH: 3.13 u[IU]/mL (ref 0.450–4.500)

## 2020-12-20 LAB — SPECIMEN STATUS REPORT

## 2020-12-30 ENCOUNTER — Telehealth: Payer: Self-pay | Admitting: Internal Medicine

## 2020-12-30 NOTE — Telephone Encounter (Signed)
Called to review blood work from last visit. No questions. Nancy [name] reports monitoring home BP, thinks SBP 120-130. Encouraged continued monitoring and brining her log to next appointment. She will call to make an appointment in a couple of weeks for f/u.  Dickie La, MD

## 2021-01-20 ENCOUNTER — Encounter (HOSPITAL_COMMUNITY): Payer: Self-pay

## 2021-01-20 ENCOUNTER — Emergency Department (HOSPITAL_COMMUNITY)
Admission: EM | Admit: 2021-01-20 | Discharge: 2021-01-20 | Disposition: A | Discharge status: Home / Self Care | Payer: Medicare HMO | Attending: Emergency Medicine | Admitting: Emergency Medicine

## 2021-01-20 ENCOUNTER — Other Ambulatory Visit: Payer: Self-pay

## 2021-01-20 DIAGNOSIS — Z5321 Procedure and treatment not carried out due to patient leaving prior to being seen by health care provider: Secondary | ICD-10-CM | POA: Diagnosis not present

## 2021-01-20 DIAGNOSIS — R109 Unspecified abdominal pain: Secondary | ICD-10-CM | POA: Insufficient documentation

## 2021-01-20 LAB — COMPREHENSIVE METABOLIC PANEL WITH GFR
ALT: 13 U/L (ref 0–44)
AST: 18 U/L (ref 15–41)
Albumin: 3 g/dL — ABNORMAL LOW (ref 3.5–5.0)
Alkaline Phosphatase: 70 U/L (ref 38–126)
Anion gap: 9 (ref 5–15)
BUN: 9 mg/dL (ref 8–23)
CO2: 27 mmol/L (ref 22–32)
Calcium: 8.7 mg/dL — ABNORMAL LOW (ref 8.9–10.3)
Chloride: 99 mmol/L (ref 98–111)
Creatinine, Ser: 0.94 mg/dL (ref 0.44–1.00)
GFR, Estimated: >60 mL/min
Glucose, Bld: 104 mg/dL — ABNORMAL HIGH (ref 70–99)
Potassium: 3.9 mmol/L (ref 3.5–5.1)
Sodium: 135 mmol/L (ref 135–145)
Total Bilirubin: 1 mg/dL (ref 0.3–1.2)
Total Protein: 7.6 g/dL (ref 6.5–8.1)

## 2021-01-20 LAB — URINALYSIS, ROUTINE W REFLEX MICROSCOPIC
Glucose, UA: NEGATIVE mg/dL
Hgb urine dipstick: NEGATIVE
Ketones, ur: NEGATIVE mg/dL
Leukocytes,Ua: NEGATIVE
Nitrite: NEGATIVE
Protein, ur: NEGATIVE mg/dL
Specific Gravity, Urine: 1.025 (ref 1.005–1.030)
pH: 6 (ref 5.0–8.0)

## 2021-01-20 LAB — CBC
HCT: 33.6 % — ABNORMAL LOW (ref 36.0–46.0)
Hemoglobin: 10.2 g/dL — ABNORMAL LOW (ref 12.0–15.0)
MCH: 23.4 pg — ABNORMAL LOW (ref 26.0–34.0)
MCHC: 30.4 g/dL (ref 30.0–36.0)
MCV: 77.1 fL — ABNORMAL LOW (ref 80.0–100.0)
Platelets: 304 10*3/uL (ref 150–400)
RBC: 4.36 MIL/uL (ref 3.87–5.11)
RDW: 14.6 % (ref 11.5–15.5)
WBC: 8 10*3/uL (ref 4.0–10.5)
nRBC: 0 % (ref 0.0–0.2)

## 2021-01-20 LAB — LIPASE, BLOOD: Lipase: 39 U/L (ref 11–51)

## 2021-01-20 NOTE — ED Triage Notes (Signed)
Patient complains of 2 days of abdominal pain with intake of liquid or solids. No nausea and no vomiting. Reports stool is dark.

## 2021-01-21 ENCOUNTER — Encounter: Payer: Self-pay | Admitting: Student

## 2021-01-21 ENCOUNTER — Telehealth: Payer: Self-pay | Admitting: *Deleted

## 2021-01-21 ENCOUNTER — Telehealth: Payer: Self-pay

## 2021-01-21 ENCOUNTER — Ambulatory Visit (INDEPENDENT_AMBULATORY_CARE_PROVIDER_SITE_OTHER): Payer: Medicare HMO | Admitting: Student

## 2021-01-21 ENCOUNTER — Other Ambulatory Visit: Payer: Self-pay | Admitting: Student

## 2021-01-21 ENCOUNTER — Inpatient Hospital Stay (HOSPITAL_COMMUNITY)
Admission: RE | Admit: 2021-01-21 | Discharge: 2021-01-21 | Disposition: A | Discharge status: Home / Self Care | Payer: Medicare HMO | Source: Ambulatory Visit | Attending: Internal Medicine | Admitting: Internal Medicine

## 2021-01-21 VITALS — BP 148/62 | HR 82 | Temp 98.2°F | Ht 63.0 in | Wt 243.3 lb

## 2021-01-21 DIAGNOSIS — K810 Acute cholecystitis: Secondary | ICD-10-CM | POA: Diagnosis not present

## 2021-01-21 DIAGNOSIS — R1011 Right upper quadrant pain: Secondary | ICD-10-CM

## 2021-01-21 HISTORY — DX: Acute cholecystitis: K81.0

## 2021-01-21 LAB — CBC WITH DIFFERENTIAL/PLATELET
Abs Immature Granulocytes: 0.01 10*3/uL (ref 0.00–0.07)
Basophils Absolute: 0.1 10*3/uL (ref 0.0–0.1)
Basophils Relative: 1 %
Eosinophils Absolute: 0.3 10*3/uL (ref 0.0–0.5)
Eosinophils Relative: 4 %
HCT: 33.1 % — ABNORMAL LOW (ref 36.0–46.0)
Hemoglobin: 10 g/dL — ABNORMAL LOW (ref 12.0–15.0)
Immature Granulocytes: 0 %
Lymphocytes Relative: 30 %
Lymphs Abs: 2.1 10*3/uL (ref 0.7–4.0)
MCH: 23.3 pg — ABNORMAL LOW (ref 26.0–34.0)
MCHC: 30.2 g/dL (ref 30.0–36.0)
MCV: 77 fL — ABNORMAL LOW (ref 80.0–100.0)
Monocytes Absolute: 0.8 10*3/uL (ref 0.1–1.0)
Monocytes Relative: 11 %
Neutro Abs: 3.7 10*3/uL (ref 1.7–7.7)
Neutrophils Relative %: 54 %
Platelets: 327 10*3/uL (ref 150–400)
RBC: 4.3 MIL/uL (ref 3.87–5.11)
RDW: 14.6 % (ref 11.5–15.5)
WBC: 7 10*3/uL (ref 4.0–10.5)
nRBC: 0 % (ref 0.0–0.2)

## 2021-01-21 LAB — COMPREHENSIVE METABOLIC PANEL WITH GFR
ALT: 12 U/L (ref 0–44)
AST: 18 U/L (ref 15–41)
Albumin: 3 g/dL — ABNORMAL LOW (ref 3.5–5.0)
Alkaline Phosphatase: 69 U/L (ref 38–126)
Anion gap: 7 (ref 5–15)
BUN: 11 mg/dL (ref 8–23)
CO2: 28 mmol/L (ref 22–32)
Calcium: 8.8 mg/dL — ABNORMAL LOW (ref 8.9–10.3)
Chloride: 98 mmol/L (ref 98–111)
Creatinine, Ser: 0.93 mg/dL (ref 0.44–1.00)
GFR, Estimated: >60 mL/min
Glucose, Bld: 106 mg/dL — ABNORMAL HIGH (ref 70–99)
Potassium: 4.4 mmol/L (ref 3.5–5.1)
Sodium: 133 mmol/L — ABNORMAL LOW (ref 135–145)
Total Bilirubin: 0.5 mg/dL (ref 0.3–1.2)
Total Protein: 7.4 g/dL (ref 6.5–8.1)

## 2021-01-21 NOTE — Assessment & Plan Note (Addendum)
Assessment: Patient presented with abdominal pain for the past 6 days.  States that the pain is mostly in her upper right and middle portions.  She is never had anything like this before.  She describes the pain as a sharp pain does not radiate.  She states that it worsens with food.  It is relieved some by bowel movements.  She states she has had dark diarrhea.  Of note she states she has been taking Pepto-Bismol for the past few days.  On exam patient is exquisitely tender right upper quadrant.  I suspect a hepatobiliary pathology.  She was seen in the emergency department yesterday and her lab results were unremarkable, normal AST/ALT, alk phos, T bili.  She is without systemic symptoms of fever chills, she denied vomiting,  chest pain.  She did have some shortness of breath with the pain.  Do not suspect ascendingg cholangitis as she is hemodynamically stable and without fever and jaundice. Less likely to be choledocholithiasis with unremarkable CMP .  Patient does have history of gallstones on prior imaging, overall picture is suspicious for acute cholecystitis.  Patient was sent for stat right upper quadrant ultrasound.  It was positive for multiple gallstones, there was no gallbladder wall thickening.  She had no ductal dilation.  She did have a positive Murphy sign on the exam.  Per radiology report suspicious for acute cholecystitis.  Lab work done today unremarkable CBC, CMP.  UA done in the ED yesterday with small amounts of bilirubin.  Lipase negative  Given her overall clinical picture I suspect acute cholecystitis. We will admit the patient and consult general surgery.  She is not septic or severely ill, believe she is a good surgical candidate if surgery agrees. Will plan to send patient home and call once bed available. On differential but less likely are gastric ulcers, patient with history of GERD.  Lipase negative yesterday do not suspect pancreatitis.  She may need HIDA scan for further  evaluation as she did not have gallbladder wall thickening.  Plan: -Admit to inpatient services for possible cholecystectomy  Addendum: Overnight patient placement attempted to call patient x3 however did not get an answer. Patient's daughter called by night team who says she will go to patient's home.

## 2021-01-21 NOTE — Telephone Encounter (Signed)
Patient left message with front desk asking if she was allowed to eat today (patient is a direct admit with admission pending).  RN asked Dr katsaddouros/Dr. Mikey Bussing, was informed patient can eat until midnight when she will become NPO.  TC to patient, VM obtained and message left informing her she is able to eat, however she may find that eating will cause her some pain.  Instructions given to eat bland foods as tolerated, nothing greasy.  Informed she will be NPO at midnight. SChaplin, RN,BSN

## 2021-01-21 NOTE — Progress Notes (Signed)
CC: Abdominal pain, diarrhea, nausea  HPI:  [name] is a 65 y.o. female with a past medical history stated below and presents today for abdominal pain, diarrhea, nausea. Please see problem based assessment and plan for additional details.  Past Medical History:  Diagnosis Date   AKI (acute kidney injury) (HCC) 12/04/2018   Back pain    chronic low back pain L4-5 discectomy:11/99. degenerative thoracic spondylotic changes 4/07   Chest pain    neg adenosine myoview.   Hypertension    no LVH EKG-7/05, nl M/C ratio 4/07   Hypothyroidism    09/1998   Lower extremity edema    echo EF 55-65% w/o evidence of Dias dysfx.,    Menorrhagia    Microcytic anemia    09/1998. Hb-10.5/MCV 76. needs ferritin to determine ACD vs IDA   Morbid obesity (HCC)    Obstructive sleep apnea    severe 7/05 RD 161 per hr. /CPAP 18cwp   Pneumonia due to COVID-19 virus 12/04/2018   Polyarthralgia    (knee/back/ankle) w/dx of fibromyalgia? given by [facility] rheum, knee pain chronic 2/2 obesity, fibromyalgia and Sjogren's.   Primary Sjogren's syndrome (HCC)    anti Ro+;ANA>1:1280 in homogen pattern, negative ds DNA/RF/anti Smith/RNP/C3-4 comp/la/jo1/Scleroderma/centromere, neg HIV/ACE/Hep B/C, nl CXR 2/05, schirmer salivary glandtest not done, symptom rx:eye drops/prednisone/plaquenil referral to [facility] rheum, [name] 3/07. saw [name] but stopped 2/2 cost.   Uterine bleeding    Viral URI with cough 11/03/2017    Current Outpatient Medications on File Prior to Visit  Medication Sig Dispense Refill   acetaminophen (TYLENOL) 500 MG tablet Take 2 tablets (1,000 mg total) by mouth every 8 (eight) hours as needed for moderate pain. 180 tablet 0   albuterol (PROVENTIL HFA;VENTOLIN HFA) 108 (90 Base) MCG/ACT inhaler Inhale 1-2 puffs every 6 (six) hours as needed into the lungs for wheezing or shortness of breath. 1 Inhaler 0   diclofenac Sodium (VOLTAREN) 1 % GEL Apply 4 g topically every 6  (six) hours as needed (arthritis pain). 150 g 0   HYDROcodone-acetaminophen (NORCO) 7.5-325 MG tablet Take 1 tablet by mouth every 8 (eight) hours as needed.     levothyroxine (EUTHYROX) 50 MCG tablet Take 50 mcg by mouth daily.     metoprolol succinate (TOPROL-XL) 50 MG 24 hr tablet Take 50 mg by mouth daily. Take with or immediately following a meal.     No current facility-administered medications on file prior to visit.    Family History  Problem Relation Age of Onset   Hypertension Mother    Cancer Mother    Cancer Father    Cancer Sister    Ovarian cancer Other        family history   Breast cancer Other        family history in 1st degree relatives.   Thyroid disease Sister    Colon cancer Neg Hx    Stomach cancer Neg Hx     Social History   Socioeconomic History   Marital status: Married    Spouse name: Not on file   Number of children: Not on file   Years of education: Not on file   Highest education level: Not on file  Occupational History   Occupation: house wife    Employer: UNEMPLOYED  Tobacco Use   Smoking status: Never   Smokeless tobacco: Never  Substance and Sexual Activity   Alcohol use: No    Alcohol/week: 0.0 standard drinks   Drug use: No  Sexual activity: Not on file  Other Topics Concern   Not on file  Social History Narrative   Her daughter Nancy [name]) often comes with her to visits. She also has a grand daughter and great grand daughter(whose name is Nancy [name])   Social Determinants of Corporate investment banker Strain: Not on file  Food Insecurity: Not on file  Transportation Needs: Not on file  Physical Activity: Not on file  Stress: Not on file  Social Connections: Not on file  Intimate Partner Violence: Not on file    Review of Systems: ROS negative except for what is noted on the assessment and plan.  Vitals:   01/21/21 0956  BP: (!) 148/62  Pulse: 82  Temp: 98.2 F (36.8 C)  SpO2: 98%  Weight: 243 lb 4.8 oz (110.4 kg)   Height: 5\' 3"  (1.6 m)     Physical Exam: Constitutional: mild distress HENT: normocephalic atraumatic Eyes: conjunctiva non-erythematous Neck: supple Cardiovascular: regular rate and rhythm, no m/r/g Pulmonary/Chest: normal work of breathing on room air, lungs clear to auscultation bilaterally Abdominal: Soft, nondistended.  Exquisitely tender to palpate in right upper quadrant and epigastric region.  Unable to assess for [facility] sign.  Normal bowel sounds. MSK: normal bulk and tone Neurological: alert & oriented x 3 Skin: warm and dry Psych: Normal mood and thought process   Assessment & Plan:   See Encounters Tab for problem based charting.  Patient discussed with Dr.  [facility], D.O. [facility], PGY-2 Pager: [phone], Phone: [phone] Date 01/21/2021 Time 5:13 PM

## 2021-01-21 NOTE — Patient Instructions (Signed)
Thank you, Nancy [name] for allowing [place] to provide your care today. Today we discussed .  Right sided abdominal pain We are getting a stat ultrasound of your stomach.  I will call you with these results.  If they are positive we may have to admit you to the hospital and I will let you know moving forward.  If they are negative we will continue to treat at home and reassess you next week in the clinic.  I have ordered the following labs for you:  Lab Orders         CMP w Anion Gap (STAT/Sunquest-performed on-site)         CBC with Diff      Referrals ordered today:   Referral Orders  No referral(s) requested today     I have ordered the following medication/changed the following medications:   Stop the following medications: There are no discontinued medications.   Start the following medications: No orders of the defined types were placed in this encounter.    Follow up:  1 week if not admitted     Should you have any questions or concerns please call the internal medicine clinic at [phone].    Thalia Bloodgood, D.O. [facility]

## 2021-01-21 NOTE — Telephone Encounter (Signed)
Diane with [facility] called to report the following:  IMPRESSION: Numerous gallstones are present with a positive sonographic Murphy sign noted by sonographer, although there is no evidence of gallbladder wall thickening. Findings are suggestive of acute cholecystitis. HIDA scan could be performed for further evaluation if clinically indicated.

## 2021-01-22 ENCOUNTER — Telehealth: Payer: Self-pay | Admitting: *Deleted

## 2021-01-22 ENCOUNTER — Telehealth: Payer: Self-pay | Admitting: Internal Medicine

## 2021-01-22 ENCOUNTER — Inpatient Hospital Stay (HOSPITAL_COMMUNITY)
Admission: AD | Admit: 2021-01-22 | Discharge: 2021-01-27 | DRG: 417 | Disposition: A | Discharge status: Home Health | Payer: Medicare HMO | Source: Ambulatory Visit | Attending: Internal Medicine | Admitting: Internal Medicine

## 2021-01-22 DIAGNOSIS — Z808 Family history of malignant neoplasm of other organs or systems: Secondary | ICD-10-CM

## 2021-01-22 DIAGNOSIS — K219 Gastro-esophageal reflux disease without esophagitis: Secondary | ICD-10-CM | POA: Diagnosis present

## 2021-01-22 DIAGNOSIS — Z803 Family history of malignant neoplasm of breast: Secondary | ICD-10-CM

## 2021-01-22 DIAGNOSIS — E039 Hypothyroidism, unspecified: Secondary | ICD-10-CM | POA: Diagnosis present

## 2021-01-22 DIAGNOSIS — Z8349 Family history of other endocrine, nutritional and metabolic diseases: Secondary | ICD-10-CM

## 2021-01-22 DIAGNOSIS — Z91018 Allergy to other foods: Secondary | ICD-10-CM

## 2021-01-22 DIAGNOSIS — M797 Fibromyalgia: Secondary | ICD-10-CM | POA: Diagnosis present

## 2021-01-22 DIAGNOSIS — Z79890 Hormone replacement therapy (postmenopausal): Secondary | ICD-10-CM

## 2021-01-22 DIAGNOSIS — N179 Acute kidney failure, unspecified: Secondary | ICD-10-CM | POA: Diagnosis present

## 2021-01-22 DIAGNOSIS — Z6841 Body Mass Index (BMI) 40.0 and over, adult: Secondary | ICD-10-CM

## 2021-01-22 DIAGNOSIS — Z20822 Contact with and (suspected) exposure to covid-19: Secondary | ICD-10-CM | POA: Diagnosis present

## 2021-01-22 DIAGNOSIS — Z8052 Family history of malignant neoplasm of bladder: Secondary | ICD-10-CM

## 2021-01-22 DIAGNOSIS — K8000 Calculus of gallbladder with acute cholecystitis without obstruction: Principal | ICD-10-CM | POA: Diagnosis present

## 2021-01-22 DIAGNOSIS — Z888 Allergy status to other drugs, medicaments and biological substances status: Secondary | ICD-10-CM

## 2021-01-22 DIAGNOSIS — Z8041 Family history of malignant neoplasm of ovary: Secondary | ICD-10-CM

## 2021-01-22 DIAGNOSIS — G4733 Obstructive sleep apnea (adult) (pediatric): Secondary | ICD-10-CM | POA: Diagnosis present

## 2021-01-22 DIAGNOSIS — Z981 Arthrodesis status: Secondary | ICD-10-CM

## 2021-01-22 DIAGNOSIS — Z79899 Other long term (current) drug therapy: Secondary | ICD-10-CM

## 2021-01-22 DIAGNOSIS — R1011 Right upper quadrant pain: Secondary | ICD-10-CM

## 2021-01-22 DIAGNOSIS — K82A2 Perforation of gallbladder in cholecystitis: Secondary | ICD-10-CM | POA: Diagnosis present

## 2021-01-22 DIAGNOSIS — D62 Acute posthemorrhagic anemia: Secondary | ICD-10-CM | POA: Diagnosis not present

## 2021-01-22 DIAGNOSIS — Z8249 Family history of ischemic heart disease and other diseases of the circulatory system: Secondary | ICD-10-CM

## 2021-01-22 DIAGNOSIS — I10 Essential (primary) hypertension: Secondary | ICD-10-CM | POA: Diagnosis present

## 2021-01-22 DIAGNOSIS — K651 Peritoneal abscess: Secondary | ICD-10-CM | POA: Diagnosis present

## 2021-01-22 LAB — CBC WITH DIFFERENTIAL/PLATELET
Abs Immature Granulocytes: 0.02 10*3/uL (ref 0.00–0.07)
Basophils Absolute: 0 10*3/uL (ref 0.0–0.1)
Basophils Relative: 1 %
Eosinophils Absolute: 0.3 10*3/uL (ref 0.0–0.5)
Eosinophils Relative: 4 %
HCT: 32.6 % — ABNORMAL LOW (ref 36.0–46.0)
Hemoglobin: 10.1 g/dL — ABNORMAL LOW (ref 12.0–15.0)
Immature Granulocytes: 0 %
Lymphocytes Relative: 39 %
Lymphs Abs: 2.3 10*3/uL (ref 0.7–4.0)
MCH: 23.3 pg — ABNORMAL LOW (ref 26.0–34.0)
MCHC: 31 g/dL (ref 30.0–36.0)
MCV: 75.3 fL — ABNORMAL LOW (ref 80.0–100.0)
Monocytes Absolute: 0.6 10*3/uL (ref 0.1–1.0)
Monocytes Relative: 10 %
Neutro Abs: 2.8 10*3/uL (ref 1.7–7.7)
Neutrophils Relative %: 46 %
Platelets: 266 10*3/uL (ref 150–400)
RBC: 4.33 MIL/uL (ref 3.87–5.11)
RDW: 14.5 % (ref 11.5–15.5)
WBC: 6 10*3/uL (ref 4.0–10.5)
nRBC: 0 % (ref 0.0–0.2)

## 2021-01-22 LAB — COMPREHENSIVE METABOLIC PANEL WITH GFR
ALT: 15 U/L (ref 0–44)
AST: 18 U/L (ref 15–41)
Albumin: 3 g/dL — ABNORMAL LOW (ref 3.5–5.0)
Alkaline Phosphatase: 60 U/L (ref 38–126)
Anion gap: 9 (ref 5–15)
BUN: 11 mg/dL (ref 8–23)
CO2: 24 mmol/L (ref 22–32)
Calcium: 8.8 mg/dL — ABNORMAL LOW (ref 8.9–10.3)
Chloride: 102 mmol/L (ref 98–111)
Creatinine, Ser: 0.98 mg/dL (ref 0.44–1.00)
GFR, Estimated: >60 mL/min
Glucose, Bld: 97 mg/dL (ref 70–99)
Potassium: 4 mmol/L (ref 3.5–5.1)
Sodium: 135 mmol/L (ref 135–145)
Total Bilirubin: 0.5 mg/dL (ref 0.3–1.2)
Total Protein: 7.4 g/dL (ref 6.5–8.1)

## 2021-01-22 MED ORDER — ONDANSETRON HCL 4 MG/2ML IJ SOLN: 4.0000 mg | Freq: Four times a day (QID) | INTRAMUSCULAR | Status: DC | PRN

## 2021-01-22 MED ORDER — IOHEXOL 300 MG/ML  SOLN
25.0000 mL | Freq: Two times a day (BID) | INTRAMUSCULAR | Status: AC
  Administered 2021-01-22: 25 mL via ORAL

## 2021-01-22 MED ORDER — METOPROLOL SUCCINATE ER 25 MG PO TB24
50.0000 mg | ORAL_TABLET | Freq: Every day | ORAL | Status: DC
  Administered 2021-01-22 – 2021-01-24 (×3): 50 mg via ORAL
  Filled 2021-01-22 (×3): qty 2

## 2021-01-22 MED ORDER — HYDROCODONE-ACETAMINOPHEN 7.5-325 MG PO TABS
1.0000 | ORAL_TABLET | Freq: Three times a day (TID) | ORAL | Status: DC | PRN
  Administered 2021-01-24: 1 via ORAL
  Filled 2021-01-22: qty 1

## 2021-01-22 MED ORDER — ACETAMINOPHEN 325 MG PO TABS: 650.0000 mg | ORAL_TABLET | Freq: Four times a day (QID) | ORAL | Status: DC | PRN

## 2021-01-22 MED ORDER — LEVOTHYROXINE SODIUM 50 MCG PO TABS
50.0000 ug | ORAL_TABLET | Freq: Every day | ORAL | Status: DC
  Administered 2021-01-24 – 2021-01-27 (×4): 50 ug via ORAL
  Filled 2021-01-22 (×4): qty 1

## 2021-01-22 MED ORDER — ACETAMINOPHEN 650 MG RE SUPP
650.0000 mg | Freq: Four times a day (QID) | RECTAL | Status: DC | PRN
  Filled 2021-01-22: qty 1

## 2021-01-22 MED ORDER — HYDROMORPHONE HCL 1 MG/ML IJ SOLN
0.5000 mg | Freq: Four times a day (QID) | INTRAMUSCULAR | Status: DC | PRN
  Administered 2021-01-22: 0.5 mg via INTRAVENOUS
  Filled 2021-01-22: qty 0.5

## 2021-01-22 MED ORDER — PIPERACILLIN-TAZOBACTAM 3.375 G IVPB 30 MIN
3.3750 g | Freq: Once | INTRAVENOUS | Status: AC
  Administered 2021-01-22: 3.375 g via INTRAVENOUS
  Filled 2021-01-22 (×2): qty 50

## 2021-01-22 MED ORDER — PIPERACILLIN-TAZOBACTAM 3.375 G IVPB
3.3750 g | Freq: Three times a day (TID) | INTRAVENOUS | Status: DC
  Administered 2021-01-22 – 2021-01-27 (×15): 3.375 g via INTRAVENOUS
  Filled 2021-01-22 (×16): qty 50

## 2021-01-22 MED ORDER — ONDANSETRON HCL 4 MG PO TABS: 4.0000 mg | ORAL_TABLET | Freq: Four times a day (QID) | ORAL | Status: DC | PRN

## 2021-01-22 MED ORDER — LACTATED RINGERS IV SOLN: INTRAVENOUS | Status: AC

## 2021-01-22 MED ORDER — ALBUTEROL SULFATE (2.5 MG/3ML) 0.083% IN NEBU: 3.0000 mL | INHALATION_SOLUTION | Freq: Four times a day (QID) | RESPIRATORY_TRACT | Status: DC | PRN

## 2021-01-22 NOTE — Telephone Encounter (Signed)
Called and discussed status of bed with patient placement. Patient called multiple times but unable to reach her yesterday around 5PM and later in the evening. They also attempted to call daughter and did not reach her. I called daughter this morning and spoke to her. Daughter is going to go to her mothers house . Daughter Karsen Fellows) ask we call her number, [phone]. I spoke to bed placement. No beds available at this moment, but they will call when one is available.

## 2021-01-22 NOTE — Consult Note (Signed)
Reason for Consult: Abdominal pain Referring Physician: Dr. Marcial Pacas is an 65 y.o. [name].  HPI: Patient is a 65 year old [name], with a history of OSA, morbid obesity, GERD, hypothyroidism who comes in secondary to acute abdominal pain. And discussed with the patient she states that she has had epigastric/right upper quadrant abdominal pain over the last 6 months.  She states that her pain episodes have waxed and waned.  She states that her most recent episode started last Thursday.  She states that she has had epigastric abdominal pain.  She states she has had minimal p.o. secondary to pain.  She states her meal prior to her onset of the most recent episode was fried chicken.  Patient denies any emesis but does states she has some nausea.  Patient underwent an ultrasound which revealed multiple gallstones, largest being 1.6 cm, no gallbladder wall thickening.  Eulah Pont sign was noted.  I did review the ultrasound personally.  Patient did have laboratory studies.  Patient had no leukocytosis, normal T bili 0.5, normal AST, ALT.  I did review the laboratory studies personally.  General surgery was consulted for further evaluation and management  Past Medical History:  Diagnosis Date   AKI (acute kidney injury) (HCC) 12/04/2018   Back pain    chronic low back pain L4-5 discectomy:11/99. degenerative thoracic spondylotic changes 4/07   Chest pain    neg adenosine myoview.   Hypertension    no LVH EKG-7/05, nl M/C ratio 4/07   Hypothyroidism    09/1998   Lower extremity edema    echo EF 55-65% w/o evidence of Dias dysfx.,    Menorrhagia    Microcytic anemia    09/1998. Hb-10.5/MCV Nancy. needs ferritin to determine ACD vs IDA   Morbid obesity (HCC)    Obstructive sleep apnea    severe 7/05 RD 161 per hr. /CPAP 18cwp   Pneumonia due to COVID-19 virus 12/04/2018   Polyarthralgia    (knee/back/ankle) w/dx of fibromyalgia? given by [facility] rheum, knee pain chronic 2/2 obesity,  fibromyalgia and Sjogren's.   Primary Sjogren's syndrome (HCC)    anti Ro+;ANA>1:1280 in homogen pattern, negative ds DNA/RF/anti Smith/RNP/C3-4 comp/la/jo1/Scleroderma/centromere, neg HIV/ACE/Hep B/C, nl CXR 2/05, schirmer salivary glandtest not done, symptom rx:eye drops/prednisone/plaquenil referral to [facility] rheum, [name] 3/07. saw [name] but stopped 2/2 cost.   Uterine bleeding    Viral URI with cough 11/03/2017    Past Surgical History:  Procedure Laterality Date   EXAMINATION UNDER ANESTHESIA  11/23/2011   Procedure: EXAM UNDER ANESTHESIA;  Surgeon: Tereso Newcomer, MD;  Location: [facility];  Service: Gynecology;  Laterality: N/A;   HYSTEROSCOPY WITH D & C  11/23/2011   Procedure: DILATATION AND CURETTAGE /HYSTEROSCOPY;  Surgeon: Tereso Newcomer, MD;  Location: [facility];  Service: Gynecology;  Laterality: N/A;   LUMBAR FUSION  1999   SHOULDER SURGERY  unk   TONSILLECTOMY     TUBAL LIGATION  1977   WISDOM TOOTH EXTRACTION      Family History  Problem Relation Age of Onset   Hypertension Mother    Cancer Mother    Cancer Father    Cancer Sister    Ovarian cancer Other        family history   Breast cancer Other        family history in 1st degree relatives.   Thyroid disease Sister    Colon cancer Neg Hx    Stomach cancer Neg Hx     Social  History:  reports that she has never smoked. She has never used smokeless tobacco. She reports that she does not drink alcohol and does not use drugs.  Allergies:  Allergies  Allergen Reactions   Amlodipine     Lower extremity swelling.  This reaction only occurred when taking GENERIC amlodipine.  She previously tolerated brand name Norvasc well.   Coconut Fatty Acids     Break out   Naproxen Sodium Nausea And Vomiting    Says she can take ibuprofen     Medications: I have reviewed the patient's current medications.  Results for orders placed or performed in visit on 01/21/21 (from the past 48 hour(s))  CMP w  Anion Gap (STAT/Sunquest-performed on-site)     Status: Abnormal   Collection Time: 01/21/21 11:00 AM  Result Value Ref Range   Sodium 133 (L) 135 - 145 mmol/L   Potassium 4.4 3.5 - 5.1 mmol/L   Chloride 98 98 - 111 mmol/L   CO2 28 22 - 32 mmol/L   Glucose, Bld 106 (H) 70 - 99 mg/dL    Comment: Glucose reference range applies only to samples taken after fasting for at least 8 hours.   BUN 11 8 - 23 mg/dL   Creatinine, Ser 2.59 0.44 - 1.00 mg/dL   Calcium 8.8 (L) 8.9 - 10.3 mg/dL   Total Protein 7.4 6.5 - 8.1 g/dL   Albumin 3.0 (L) 3.5 - 5.0 g/dL   AST 18 15 - 41 U/L   ALT 12 0 - 44 U/L   Alkaline Phosphatase 69 38 - 126 U/L   Total Bilirubin 0.5 0.3 - 1.2 mg/dL   GFR, Estimated >56 >38 mL/min    Comment: (NOTE) Calculated using the CKD-EPI Creatinine Equation (2021)    Anion gap 7 5 - 15    Comment: Performed at [facility], [address] [place], [place] [address]  CBC with Diff     Status: Abnormal   Collection Time: 01/21/21 11:00 AM  Result Value Ref Range   WBC 7.0 4.0 - 10.5 K/uL   RBC 4.30 3.87 - 5.11 MIL/uL   Hemoglobin 10.0 (L) 12.0 - 15.0 g/dL   HCT 32.9 (L) 51.8 - 84.1 %   MCV 77.0 (L) 80.0 - 100.0 fL   MCH 23.3 (L) 26.0 - 34.0 pg   MCHC 30.2 30.0 - 36.0 g/dL   RDW 66.0 63.0 - 16.0 %   Platelets 327 150 - 400 K/uL   nRBC 0.0 0.0 - 0.2 %   Neutrophils Relative % 54 %   Neutro Abs 3.7 1.7 - 7.7 K/uL   Lymphocytes Relative 30 %   Lymphs Abs 2.1 0.7 - 4.0 K/uL   Monocytes Relative 11 %   Monocytes Absolute 0.8 0.1 - 1.0 K/uL   Eosinophils Relative 4 %   Eosinophils Absolute 0.3 0.0 - 0.5 K/uL   Basophils Relative 1 %   Basophils Absolute 0.1 0.0 - 0.1 K/uL   Immature Granulocytes 0 %   Abs Immature Granulocytes 0.01 0.00 - 0.07 K/uL    Comment: Performed at [facility], [address], [place], [place] [address]    US Abdomen Limited RUQ (LIVER/GB)  Result Date: 01/21/2021 CLINICAL DATA:  Right upper quadrant pain EXAM: ULTRASOUND ABDOMEN  LIMITED RIGHT UPPER QUADRANT COMPARISON:  None. FINDINGS: Gallbladder: Gallstones are present, largest measures up to 1.6 cm. No gallbladder wall thickening. Positive sonographic Murphy sign noted by sonographer. Common bile duct: Diameter: 2 mm Liver:  No focal lesion identified. Normal parenchymal echogenicity. Portal vein is patent on color Doppler imaging with normal direction of blood flow towards the liver. Other: None. IMPRESSION: Numerous gallstones are present with a positive sonographic Murphy sign noted by sonographer, although there is no evidence of gallbladder wall thickening. Findings are suggestive of acute cholecystitis. HIDA scan could be performed for further evaluation if clinically indicated. These results will be called to the ordering clinician or representative by the Radiologist Assistant, and communication documented in the PACS or [employer]. Electronically Signed   By: Allegra Lai M.D.   On: 01/21/2021 13:13    Review of Systems  Constitutional:  Negative for chills and fever.  HENT:  Negative for ear discharge, hearing loss and sore throat.   Eyes:  Negative for discharge.  Respiratory:  Negative for cough and shortness of breath.   Cardiovascular:  Negative for chest pain and leg swelling.  Gastrointestinal:  Positive for abdominal pain and nausea. Negative for constipation, diarrhea and vomiting.  Musculoskeletal:  Negative for myalgias and neck pain.  Skin:  Negative for rash.  Allergic/Immunologic: Negative for environmental allergies.  Neurological:  Negative for dizziness and seizures.  Hematological:  Does not bruise/bleed easily.  Psychiatric/Behavioral:  Negative for suicidal ideas.   All other systems reviewed and are negative. Blood pressure (!) 151/78, pulse 87, temperature 98.4 F (36.9 C), temperature source Oral, resp. rate 18, SpO2 98 %. Physical Exam Constitutional:      Appearance: She is well-developed.     Comments: Conversant No acute  distress  HENT:     Head: Normocephalic and atraumatic.  Eyes:     General: Lids are normal. No scleral icterus.    Pupils: Pupils are equal, round, and reactive to light.     Comments: Pupils are equal round and reactive No lid lag Moist conjunctiva  Neck:     Thyroid: No thyromegaly.     Trachea: No tracheal tenderness.     Comments: No cervical lymphadenopathy Cardiovascular:     Rate and Rhythm: Normal rate and regular rhythm.     Heart sounds: No murmur heard. Pulmonary:     Effort: Pulmonary effort is normal.     Breath sounds: Normal breath sounds. No wheezing or rales.  Abdominal:     Tenderness: There is abdominal tenderness in the right upper quadrant and epigastric area. There is no guarding or rebound.     Hernia: No hernia is present.  Musculoskeletal:     Cervical back: Normal range of motion and neck supple.  Skin:    General: Skin is warm.     Findings: No rash.     Nails: There is no clubbing.     Comments: Normal skin turgor  Neurological:     Mental Status: She is alert and oriented to person, place, and time.     Comments: Normal gait and station  Psychiatric:        Mood and Affect: Mood normal.        Thought Content: Thought content normal.        Judgment: Judgment normal.     Comments: Appropriate affect    Assessment/Plan: 65 year old [name] with likely acute cholecystitis based on her symptoms. Patient Active Problem List   Diagnosis Date Noted   Cholecystitis, acute with cholelithiasis 01/22/2021   Acute cholecystitis 01/21/2021   Nocturia 12/16/2020   Encounter for screening mammogram for malignant neoplasm of breast 12/16/2020   Plantar fasciitis of left foot 12/16/2020  Asymmetrical sensorineural hearing loss 08/09/2019   Wheelchair dependence 12/17/2016   Polyarthralgia 12/02/2016   Healthcare maintenance 12/18/2014   GERD (gastroesophageal reflux disease) 04/13/2014   Allergic rhinitis 01/24/2014   Morbid obesity with body mass  index (BMI) of 40.0 to 44.9 in adult (HCC) 09/14/2013   Abnormal CT of brain 11/19/2011   MACULAR DEGENERATION, BILATERAL 05/22/2008   Essential hypertension 03/17/2006   SICCA SYNDROME 03/17/2006   KNEE PAIN, CHRONIC 03/17/2006   Chronic low back pain 03/17/2006   Obstructive sleep apnea 09/07/2003   Hypothyroidism 09/07/1998   ANEMIA, IRON DEFICIENCY NOS 09/07/1998    1.  Would recommend CT scan to rule out any other possible pathology.  If no other pathology is found patient likely would benefit from lap chole. 2.  I will discuss patient's CT scan and ultrasound with Dr. Corliss Skains in the morning. 3.  Agree with antibiotics.  Axel Filler 01/22/2021, 6:01 PM

## 2021-01-22 NOTE — Hospital Course (Addendum)
#  Acute calculous perforated cholecystitis #Cholelithiasis Patient was sent in as a direct admission from the clinic for RUQ abdominal pain and found to have numerous gallstones on ultrasound, with no gallbladder wall thickening or ductal dilation. CT abd/pelvis noted acute, perforated cholecystitis with progressive pericholescystic inflammatory changes since prior ultrasound on 11/15. CMP with normal AST/ALT, alk phos, and T bili. Patient had laparoscopic subtotal cholecystectomy with RUQ drain placement on 11/17, and was found to have severe acute cholecystitis with empyema of the gallbladder and perforation contained by omentum. Also had severe inflammation of the transverse colon and duodenum adherent to the gallbladder. Started on IV zosyn prior to surgery, and was continued on IV zosyn for 4 days post-op. Pain improved throughout admission and drain had minimal serosanguinous fluid output. RUQ drain to stay in place until follow up with general surgery. Will be discharged with 6 days of Augmentin, to complete 10 day post-op course of antibiotics. PT recommending home health PT, which was set up prior to discharge.  #AKI Baseline Cr around 1. Cr improved from 1.6 on 11/20 to 1 on the day of discharge after receiving IV fluids. Renal ultrasound performed and showed no evidence of mass or hydronephrosis, although limited exam as bladder could not be visualized.   #Hypertension Blood pressure remained elevated throughout hospitalization. Patient was continued on her home metoprolol 50 mg daily, and likely will need further titration of this for better blood pressure control vs a new medication.  #Hypothyroidism Continued on home synthroid 50 mcg daily.   #OSA CPAP qhs.

## 2021-01-22 NOTE — Telephone Encounter (Signed)
Call to patient and her daughter this morning.  Was able to leave message on daughter's phone to call the Clinics regarding admission for patient.  Another call was made and patient was informed that attempts had been made x 4 from Admitting to notify her that a bed was available.  Calls had been made to her daughter as well.  After calling and no response the bed was given to another patient.  Patient stated that she had turned her phone off and forgot to turn it back on again.  Patient was asked if she still wanted to Admitted?  Patient stated that she would still like to be Admitted.  Patient was Advised to keep her phone on today and await the call from Admitting.  Admitting was called and asked to put patient in line for Admission.  Admission will probably take awhile.  This was conveyed to patient who will await call from Admitting.  Angelina Ok, RN 01/22/2021.11:23 AM..

## 2021-01-22 NOTE — H&P (Signed)
Date: 01/22/2021               Patient Name:  Nancy [name] MRN: [id number]  DOB: [birth year] Age / Sex: 65 y.o., female   PCP: Charise Killian, MD         Medical Service: Internal Medicine Teaching Service         Attending Physician: Dr. Jimmye Norman, Elaina Pattee, MD    First Contact: Dr. Buddy Duty Pager: [phone]  Second Contact: Dr. Gaylan Gerold Pager: [phone]       After Hours (After 5p/  First Contact Pager: [phone]  weekends / holidays): Second Contact Pager: [phone]   Chief Complaint: RUQ abd pain  History of Present Illness: Nancy [name] is a 65 yo female with HTN, hypothyroidism, OSA, and obesity who presents to [facility] as a direct admission from the clinic for right upper and middle quadrant abdominal pain. The patient states that her pain ultimately started about 6 months ago, but worsened over the past couple of months to the point that she could not bear it. She describes it as a colicky, sharp pain that does not radiate. Her pain is worse with eating, and is only slightly relieved when she does not eat any food. She has had limited po intake because of her pain. She was seen in the clinic on 11/15 for this, and had a stat right upper quadrant ultrasound which showed multiple gallstones but no gallbladder wall thickening and no ductal dilation. The patient was also seen in the ED the day prior (11/14), and she had normal AST/ALT, alk phos, and T bili. Today, the patient states that she ate some eggs and toast for breakfast, but has not been able to tolerate anything else because of her pain. Denies any recent weight loss. The patient also states she feels nauseous, however, she has not had any episodes of emesis. She denies any recent illness, fevers, chills, chest pain, emesis, diarrhea, hematochezia, or dysuria. She does have some shortness of breath that is associated with her pain and also notes that she has OSA and wears a CPAP most nights. The patient also endorses  orthopnea and some lower extremity edema, but denies any history of heart failure and also denies any recent worsening of this. She also notes that she has had some melena, but has been told in the past this is due to her frequent use of Pepcid, which she takes for her heartburn.   Meds:  Albuterol  Hydrocodone-acetaminophen 7.5-325 mg q8h prn Levothyroxine 53mg Metoprolol succinate 50 mg daily  No outpatient medications have been marked as taking for [facility]21 [facility]     Allergies: Allergies as of 01/21/2021 - Review Complete 01/21/2021  Allergen Reaction Noted   Amlodipine  09/26/2010   Coconut fatty acids  07/21/2011   Naproxen sodium Nausea And Vomiting    Past Medical History:  Diagnosis Date   AKI (acute kidney injury) ([place] 12/04/2018   Back pain    chronic low back pain L4-5 discectomy:11/99. degenerative thoracic spondylotic changes 4/07   Chest pain    neg adenosine myoview.   Hypertension    no LVH EKG-7/05, nl M/C ratio 4/07   Hypothyroidism    09/1998   Lower extremity edema    echo EF 55-65% w/o evidence of Dias dysfx.,    Menorrhagia    Microcytic anemia    09/1998. Hb-10.5/MCV 76. needs ferritin to determine ACD vs IDA   Morbid obesity (  [place])    Obstructive sleep apnea    severe 7/05 RD 161 per hr. /CPAP 18cwp   Pneumonia due to COVID-19 virus 12/04/2018   Polyarthralgia    (knee/back/ankle) w/dx of fibromyalgia? given by [facility] rheum, knee pain chronic 2/2 obesity, fibromyalgia and Sjogren's.   Primary Sjogren's syndrome ([place])    anti Ro+;ANA>1:1280 in homogen pattern, negative ds DNA/RF/anti Smith/RNP/C3-4 comp/la/jo1/Scleroderma/centromere, neg HIV/ACE/Hep B/C, nl CXR 2/05, schirmer salivary glandtest not done, symptom rx:eye drops/prednisone/plaquenil referral to [facility] rheum, Dr. Wynelle Cleveland 3/07. saw [name] but stopped 2/2 cost.   Uterine bleeding    Viral URI with cough 11/03/2017    Family History: Sister with  breast cancer, father had brain cancer, and mother had bladder cancer   Social History: Lives at home in [place] with her husband. Denies any tobacco or alcohol use.   Review of Systems: A complete ROS was negative except as per HPI.   Physical Exam: Blood pressure (!) 151/78, pulse 87, temperature 98.4 F (36.9 C), temperature source Oral, resp. rate 18, SpO2 98 %.  General: Patient is in mild distress. Head: Normocephalic. Atraumatic. CV: RRR. No murmurs, rubs, or gallops. 1+ bilateral lower extremity edema Pulmonary: Lungs CTAB. Normal effort on room air. No wheezing or rales. Abdominal: Soft and non-distended. Normal bowel sounds. Exquisitely tender to palpation in right upper quadrant and epigastric region. Positive Murphy sign.  Extremities: Palpable radial and DP pulses. Normal ROM. Skin: Warm and dry. No obvious rash or lesions. Neuro: A&Ox3. No focal deficit. Psych: Normal mood and affect   Assessment & Plan by Problem: Active Problems:   Cholecystitis, acute with cholelithiasis  #Probable acute calculous cholecystitis #Cholelithiasis Patient was sent in as a direct admission from the clinic for RUQ abdominal pain and found to have numerous gallstones on ultrasound, with no gallbladder wall thickening or ductal dilation. Her pain has been ongoing for about 6 months, but recently worsened over the past couple of months to the point that she had to be seen in the ED and the [facility] CMP on 11/15 with normal AST/ALT, alk phos, and T bili. Lipase also within normal limits. CBC with no leukocytosis and Hb is stable at patient's baseline of 10. Patient is afebrile, and does not appear to have any active signs of infection.  - Surgery consulted, appreciate recommendations - CT abd/pelvis w contrast pending - CMP and CBC pending - Consider HIDA scan - Zosyn per pharmacy  - NPO (pending possible surgery?) - Resumed home norco 7.5-324 mg q8h PRN - Dilaudid 0.5 mg q6h prn for severe,  breakthrough pain - LR 100 cc/hr for 10 hrs - Zofran 4 mg q6h prn (will check EKG)  #Hypertension Blood pressure elevated at 151/78 likely secondary to the severe pain the patient is experiencing. Resumed home metoprolol 50 mg daily.  - Continue to monitor BP  #Hypothyroidism   TSH and free T4 last checked 1 month ago and were normal. Resumed home synthroid 50 mcg daily.  #OSA Patient wears CPAP intermittently at home.  - CPAP qhs   Best practices: Code: Full Diet: NPO Fluids: LR 100 cc/hr x 10 hrs VTE: Held (possible surgery?)   Dispo: Admit patient to Observation with expected length of stay less than 2 midnights.  SignedDorethea Clan, DO 01/22/2021, 5:45 PM  Pager: [phone] After 5pm on weekdays and 1pm on weekends: On Call pager: [phone]

## 2021-01-22 NOTE — Progress Notes (Signed)
Pharmacy Antibiotic Note  Nancy [name] is a 65 y.o. female admitted on 01/22/2021 with  acute cholecystitis .  Pharmacy has been consulted for Zosyn dosing. SCr 0.93 stable.  Plan: Zosyn 3.375g IV ( infusion) x1; then 3.375g IV q8h (4h infusion) Monitor clinical progress, c/s, renal function F/u de-escalation plan/LOT Pharmacy will s/o consult and follow peripherally     Temp (24hrs), Avg:98.4 F (36.9 C), Min:98.4 F (36.9 C), Max:98.4 F (36.9 C)  Recent Labs  Lab 01/20/21 1034 01/21/21 1100  WBC 8.0 7.0  CREATININE 0.94 0.93    Estimated Creatinine Clearance: 70.1 mL/min (by C-G formula based on SCr of 0.93 mg/dL).    Allergies  Allergen Reactions   Amlodipine     Lower extremity swelling.  This reaction only occurred when taking GENERIC amlodipine.  She previously tolerated brand name Norvasc well.   Coconut Fatty Acids     Break out   Naproxen Sodium Nausea And Vomiting    Says she can take ibuprofen      Leia Alf, PharmD, BCPS Please check AMION for all [facility] contact numbers Clinical Pharmacist 01/22/2021 5:37 PM

## 2021-01-23 ENCOUNTER — Observation Stay (HOSPITAL_COMMUNITY): Payer: Medicare HMO | Admitting: Certified Registered Nurse Anesthetist

## 2021-01-23 ENCOUNTER — Other Ambulatory Visit: Payer: Self-pay

## 2021-01-23 ENCOUNTER — Observation Stay (HOSPITAL_COMMUNITY): Payer: Medicare HMO

## 2021-01-23 ENCOUNTER — Encounter (HOSPITAL_COMMUNITY)
Admission: AD | Disposition: A | Discharge status: Home / Self Care | Payer: Self-pay | Source: Ambulatory Visit | Attending: Internal Medicine

## 2021-01-23 ENCOUNTER — Encounter (HOSPITAL_COMMUNITY): Payer: Self-pay | Admitting: Internal Medicine

## 2021-01-23 DIAGNOSIS — N179 Acute kidney failure, unspecified: Secondary | ICD-10-CM

## 2021-01-23 DIAGNOSIS — K8000 Calculus of gallbladder with acute cholecystitis without obstruction: Secondary | ICD-10-CM | POA: Diagnosis not present

## 2021-01-23 HISTORY — PX: CHOLECYSTECTOMY: SHX55

## 2021-01-23 LAB — CBC
HCT: 29.2 % — ABNORMAL LOW (ref 36.0–46.0)
Hemoglobin: 8.9 g/dL — ABNORMAL LOW (ref 12.0–15.0)
MCH: 23.1 pg — ABNORMAL LOW (ref 26.0–34.0)
MCHC: 30.5 g/dL (ref 30.0–36.0)
MCV: 75.8 fL — ABNORMAL LOW (ref 80.0–100.0)
Platelets: 310 10*3/uL (ref 150–400)
RBC: 3.85 MIL/uL — ABNORMAL LOW (ref 3.87–5.11)
RDW: 14.6 % (ref 11.5–15.5)
WBC: 5.2 10*3/uL (ref 4.0–10.5)
nRBC: 0 % (ref 0.0–0.2)

## 2021-01-23 LAB — COMPREHENSIVE METABOLIC PANEL WITH GFR
ALT: 11 U/L (ref 0–44)
AST: 13 U/L — ABNORMAL LOW (ref 15–41)
Albumin: 2.7 g/dL — ABNORMAL LOW (ref 3.5–5.0)
Alkaline Phosphatase: 57 U/L (ref 38–126)
Anion gap: 8 (ref 5–15)
BUN: 10 mg/dL (ref 8–23)
CO2: 26 mmol/L (ref 22–32)
Calcium: 8.3 mg/dL — ABNORMAL LOW (ref 8.9–10.3)
Chloride: 96 mmol/L — ABNORMAL LOW (ref 98–111)
Creatinine, Ser: 0.98 mg/dL (ref 0.44–1.00)
GFR, Estimated: >60 mL/min
Glucose, Bld: 102 mg/dL — ABNORMAL HIGH (ref 70–99)
Potassium: 3.6 mmol/L (ref 3.5–5.1)
Sodium: 130 mmol/L — ABNORMAL LOW (ref 135–145)
Total Bilirubin: 0.6 mg/dL (ref 0.3–1.2)
Total Protein: 6.7 g/dL (ref 6.5–8.1)

## 2021-01-23 LAB — RESP PANEL BY RT-PCR (FLU A&B, COVID) ARPGX2
Influenza A by PCR: NEGATIVE
Influenza B by PCR: NEGATIVE
SARS Coronavirus 2 by RT PCR: NEGATIVE

## 2021-01-23 SURGERY — LAPAROSCOPIC CHOLECYSTECTOMY WITH INTRAOPERATIVE CHOLANGIOGRAM
Anesthesia: General | Site: Abdomen

## 2021-01-23 MED ORDER — PHENYLEPHRINE HCL-NACL 20-0.9 MG/250ML-% IV SOLN
INTRAVENOUS | Status: DC | PRN
  Administered 2021-01-23: 25 ug/min via INTRAVENOUS

## 2021-01-23 MED ORDER — DEXAMETHASONE SODIUM PHOSPHATE 10 MG/ML IJ SOLN
INTRAMUSCULAR | Status: DC | PRN
  Administered 2021-01-23: 4 mg via INTRAVENOUS

## 2021-01-23 MED ORDER — OXYCODONE HCL 5 MG/5ML PO SOLN: 5.0000 mg | Freq: Once | ORAL | Status: AC | PRN

## 2021-01-23 MED ORDER — LACTATED RINGERS IV SOLN: INTRAVENOUS | Status: AC

## 2021-01-23 MED ORDER — HYDROMORPHONE HCL 1 MG/ML IJ SOLN
INTRAMUSCULAR | Status: AC
  Filled 2021-01-23: qty 1

## 2021-01-23 MED ORDER — ROCURONIUM BROMIDE 10 MG/ML (PF) SYRINGE
PREFILLED_SYRINGE | INTRAVENOUS | Status: AC
  Filled 2021-01-23: qty 10

## 2021-01-23 MED ORDER — LACTATED RINGERS IV SOLN: INTRAVENOUS | Status: DC

## 2021-01-23 MED ORDER — ONDANSETRON HCL 4 MG/2ML IJ SOLN: 4.0000 mg | Freq: Once | INTRAMUSCULAR | Status: DC | PRN

## 2021-01-23 MED ORDER — KETOROLAC TROMETHAMINE 30 MG/ML IJ SOLN
15.0000 mg | Freq: Once | INTRAMUSCULAR | Status: AC | PRN
  Administered 2021-01-23: 19:00:00 15 mg via INTRAVENOUS

## 2021-01-23 MED ORDER — OXYCODONE HCL 5 MG PO TABS
5.0000 mg | ORAL_TABLET | Freq: Once | ORAL | Status: AC | PRN
  Administered 2021-01-23: 20:00:00 5 mg via ORAL

## 2021-01-23 MED ORDER — SUGAMMADEX SODIUM 500 MG/5ML IV SOLN: INTRAVENOUS | Status: DC | PRN

## 2021-01-23 MED ORDER — IOHEXOL 350 MG/ML SOLN
100.0000 mL | Freq: Once | INTRAVENOUS | Status: AC | PRN
  Administered 2021-01-23: 100 mL via INTRAVENOUS

## 2021-01-23 MED ORDER — ROCURONIUM BROMIDE 10 MG/ML (PF) SYRINGE
PREFILLED_SYRINGE | INTRAVENOUS | Status: DC | PRN
  Administered 2021-01-23: 10 mg via INTRAVENOUS
  Administered 2021-01-23: 90 mg via INTRAVENOUS

## 2021-01-23 MED ORDER — SODIUM CHLORIDE 0.9 % IR SOLN
Status: DC | PRN
  Administered 2021-01-23: 1000 mL

## 2021-01-23 MED ORDER — FENTANYL CITRATE (PF) 250 MCG/5ML IJ SOLN
INTRAMUSCULAR | Status: DC | PRN
  Administered 2021-01-23: 50 ug via INTRAVENOUS
  Administered 2021-01-23: 25 ug via INTRAVENOUS
  Administered 2021-01-23: 75 ug via INTRAVENOUS
  Administered 2021-01-23 (×2): 50 ug via INTRAVENOUS
  Administered 2021-01-23: 100 ug via INTRAVENOUS

## 2021-01-23 MED ORDER — LIDOCAINE 2% (20 MG/ML) 5 ML SYRINGE
INTRAMUSCULAR | Status: DC | PRN
  Administered 2021-01-23: 100 mg via INTRAVENOUS

## 2021-01-23 MED ORDER — FENTANYL CITRATE (PF) 250 MCG/5ML IJ SOLN
INTRAMUSCULAR | Status: AC
  Filled 2021-01-23: qty 5

## 2021-01-23 MED ORDER — LACTATED RINGERS IV SOLN: INTRAVENOUS | Status: DC | PRN

## 2021-01-23 MED ORDER — LIDOCAINE 2% (20 MG/ML) 5 ML SYRINGE
INTRAMUSCULAR | Status: AC
  Filled 2021-01-23: qty 5

## 2021-01-23 MED ORDER — KETOROLAC TROMETHAMINE 30 MG/ML IJ SOLN
INTRAMUSCULAR | Status: AC
  Filled 2021-01-23: qty 1

## 2021-01-23 MED ORDER — CHLORHEXIDINE GLUCONATE 0.12 % MT SOLN
15.0000 mL | OROMUCOSAL | Status: AC
  Filled 2021-01-23: qty 15

## 2021-01-23 MED ORDER — ONDANSETRON HCL 4 MG/2ML IJ SOLN
INTRAMUSCULAR | Status: AC
  Filled 2021-01-23: qty 2

## 2021-01-23 MED ORDER — HYDROMORPHONE HCL 1 MG/ML IJ SOLN
0.2500 mg | INTRAMUSCULAR | Status: DC | PRN
  Administered 2021-01-23 (×3): 0.5 mg via INTRAVENOUS

## 2021-01-23 MED ORDER — 0.9 % SODIUM CHLORIDE (POUR BTL) OPTIME
TOPICAL | Status: DC | PRN
  Administered 2021-01-23 (×2): 1000 mL

## 2021-01-23 MED ORDER — ONDANSETRON HCL 4 MG/2ML IJ SOLN
INTRAMUSCULAR | Status: DC | PRN
  Administered 2021-01-23: 4 mg via INTRAVENOUS

## 2021-01-23 MED ORDER — SUGAMMADEX SODIUM 500 MG/5ML IV SOLN
INTRAVENOUS | Status: DC | PRN
  Administered 2021-01-23: 100 mg via INTRAVENOUS
  Administered 2021-01-23: 200 mg via INTRAVENOUS

## 2021-01-23 MED ORDER — DEXAMETHASONE SODIUM PHOSPHATE 10 MG/ML IJ SOLN
INTRAMUSCULAR | Status: AC
  Filled 2021-01-23: qty 1

## 2021-01-23 MED ORDER — CHLORHEXIDINE GLUCONATE 0.12 % MT SOLN
OROMUCOSAL | Status: AC
  Administered 2021-01-23: 15:00:00 15 mL via OROMUCOSAL
  Filled 2021-01-23: qty 15

## 2021-01-23 MED ORDER — PHENYLEPHRINE 40 MCG/ML (10ML) SYRINGE FOR IV PUSH (FOR BLOOD PRESSURE SUPPORT)
PREFILLED_SYRINGE | INTRAVENOUS | Status: DC | PRN
  Administered 2021-01-23: 80 ug via INTRAVENOUS
  Administered 2021-01-23 (×2): 120 ug via INTRAVENOUS
  Administered 2021-01-23 (×4): 80 ug via INTRAVENOUS

## 2021-01-23 MED ORDER — BUPIVACAINE-EPINEPHRINE 0.25% -1:200000 IJ SOLN
INTRAMUSCULAR | Status: DC | PRN
  Administered 2021-01-23: 18 mL

## 2021-01-23 MED ORDER — MORPHINE SULFATE (PF) 4 MG/ML IV SOLN: 4.0000 mg | INTRAVENOUS | Status: DC | PRN

## 2021-01-23 MED ORDER — OXYCODONE HCL 5 MG PO TABS
ORAL_TABLET | ORAL | Status: AC
  Filled 2021-01-23: qty 1

## 2021-01-23 MED ORDER — PROPOFOL 10 MG/ML IV BOLUS
INTRAVENOUS | Status: DC | PRN
  Administered 2021-01-23: 150 mg via INTRAVENOUS
  Administered 2021-01-23: 20 mg via INTRAVENOUS

## 2021-01-23 MED ORDER — SUGAMMADEX SODIUM 500 MG/5ML IV SOLN
INTRAVENOUS | Status: AC
  Filled 2021-01-23: qty 5

## 2021-01-23 SURGICAL SUPPLY — 51 items
APPLIER CLIP 5 13 M/L LIGAMAX5 (MISCELLANEOUS) ×2
APPLIER CLIP ROT 10 11.4 M/L (STAPLE)
BAG COUNTER SPONGE SURGICOUNT (BAG) ×2
BENZOIN TINCTURE PRP APPL 2/3 (GAUZE/BANDAGES/DRESSINGS)
BLADE CLIPPER SURG (BLADE)
CANISTER SUCT 3000ML PPV (MISCELLANEOUS) ×2
CHLORAPREP W/TINT 26 (MISCELLANEOUS) ×2
COVER MAYO STAND STRL (DRAPES)
COVER SURGICAL LIGHT HANDLE (MISCELLANEOUS) ×2
DERMABOND ADVANCED (GAUZE/BANDAGES/DRESSINGS) ×1
DERMABOND ADVANCED .7 DNX12 (GAUZE/BANDAGES/DRESSINGS) ×1
DRAIN CHANNEL 19F RND (DRAIN) ×2
DRAPE C-ARM 42X120 X-RAY (DRAPES)
DRSG TEGADERM 2-3/8X2-3/4 SM (GAUZE/BANDAGES/DRESSINGS)
DRSG TEGADERM 4X4.75 (GAUZE/BANDAGES/DRESSINGS)
ELECT BLADE 4.0 EZ CLEAN MEGAD (MISCELLANEOUS) ×2
ELECT REM PT RETURN 9FT ADLT (ELECTROSURGICAL) ×2
EVACUATOR SILICONE 100CC (DRAIN) ×2
GAUZE SPONGE 2X2 8PLY STRL LF (GAUZE/BANDAGES/DRESSINGS)
GLOVE SURG ENC MOIS LTX SZ7 (GLOVE)
GLOVE SURG UNDER POLY LF SZ7.5 (GLOVE)
GOWN STRL REUS W/ TWL LRG LVL3 (GOWN DISPOSABLE) ×2
GOWN STRL REUS W/ TWL XL LVL3 (GOWN DISPOSABLE) ×1
GOWN STRL REUS W/TWL LRG LVL3 (GOWN DISPOSABLE) ×4
GOWN STRL REUS W/TWL XL LVL3 (GOWN DISPOSABLE) ×2
KIT BASIN OR (CUSTOM PROCEDURE TRAY) ×2
KIT TURNOVER KIT B (KITS) ×2
NEEDLE 22X1 1/2 (OR ONLY) (NEEDLE) ×2
NS IRRIG 1000ML POUR BTL (IV SOLUTION) ×2
PAD ARMBOARD 7.5X6 YLW CONV (MISCELLANEOUS) ×2
POUCH RETRIEVAL ECOSAC 10 (ENDOMECHANICALS) ×1
POUCH RETRIEVAL ECOSAC 10MM (ENDOMECHANICALS) ×2
POUCH SPECIMEN RETRIEVAL 10MM (ENDOMECHANICALS)
SCISSORS LAP 5X35 DISP (ENDOMECHANICALS) ×2
SET CHOLANGIOGRAPH 5 50 .035 (SET/KITS/TRAYS/PACK)
SET IRRIG TUBING LAPAROSCOPIC (IRRIGATION / IRRIGATOR) ×2
SET TUBE SMOKE EVAC HIGH FLOW (TUBING) ×2
SLEEVE ENDOPATH XCEL 5M (ENDOMECHANICALS) ×4
SPECIMEN JAR SMALL (MISCELLANEOUS) ×2
SPONGE GAUZE 2X2 STER 10/PKG (GAUZE/BANDAGES/DRESSINGS)
STRIP CLOSURE SKIN 1/2X4 (GAUZE/BANDAGES/DRESSINGS)
SUT ETHILON 2 0 FS 18 (SUTURE) ×2
SUT MNCRL AB 4-0 PS2 18 (SUTURE)
SUT VIC AB 4-0 PS2 27 (SUTURE) ×4
TOWEL GREEN STERILE (TOWEL DISPOSABLE) ×2
TOWEL GREEN STERILE FF (TOWEL DISPOSABLE) ×2
TRAY LAPAROSCOPIC MC (CUSTOM PROCEDURE TRAY) ×2
TROCAR XCEL BLUNT TIP 100MML (ENDOMECHANICALS) ×2
TROCAR XCEL NON-BLD 11X100MML (ENDOMECHANICALS) ×2
TROCAR XCEL NON-BLD 5MMX100MML (ENDOMECHANICALS) ×2
WATER STERILE IRR 1000ML POUR (IV SOLUTION)

## 2021-01-23 NOTE — Progress Notes (Signed)
Patient ID: Nancy [name], female   DOB: 21-Jun-1955, 65 y.o.   MRN: [id number] Day of Surgery   Subjective: In pre-op holding. C/O RUQ pain ROS negative except as listed above. Objective: Vital signs in last 24 hours: Temp:  [98 F (36.7 C)-98.7 F (37.1 C)] 98 F (36.7 C) (11/17 0727) Pulse Rate:  [65-90] 75 (11/17 0727) Resp:  [18] 18 (11/17 0727) BP: (151-194)/(73-87) 194/87 (11/17 0727) SpO2:  [96 %-99 %] 98 % (11/17 0727) Weight:  [110.4 kg] 110.4 kg (11/17 1518) Last BM Date: 01/22/21  Intake/Output from previous day: 11/16 0701 - 11/17 0700 In: 1233.8 [I.V.:1183.8; IV Piggyback:50] Out: -  Intake/Output this shift: Total I/O In: 624.8 [I.V.:516.7; IV Piggyback:108.1] Out: -   General appearance: alert and cooperative Resp: clear to auscultation bilaterally Cardio: regular rate and rhythm GI: soft, tender RUQ Extremities: calves soft  Lab Results: CBC  Recent Labs    01/22/21 1845 01/23/21 0121  WBC 6.0 5.2  HGB 10.1* 8.9*  HCT 32.6* 29.2*  PLT 266 310   BMET Recent Labs    01/22/21 1845 01/23/21 0121  NA 135 130*  K 4.0 3.6  CL 102 96*  CO2 24 26  GLUCOSE 97 102*  BUN 11 10  CREATININE 0.98 0.98  CALCIUM 8.8* 8.3*   PT/INR No results for input(s): LABPROT, INR in the last 72 hours. ABG No results for input(s): PHART, HCO3 in the last 72 hours.  Invalid input(s): PCO2, PO2  Studies/Results: CT ABDOMEN PELVIS W CONTRAST  Result Date: 01/23/2021 CLINICAL DATA:  Cholecystitis EXAM: CT ABDOMEN AND PELVIS WITH CONTRAST TECHNIQUE: Multidetector CT imaging of the abdomen and pelvis was performed using the standard protocol following bolus administration of intravenous contrast. CONTRAST:  OMNIPAQUE IOHEXOL 350 MG/ML SOLN COMPARISON:  04/11/2014 FINDINGS: Lower chest: Mild bibasilar atelectasis. Cardiac size is within normal limits. No pericardial effusion. Hepatobiliary: Since the prior examination, the gallbladder has become increasingly thick  walled and demonstrates progressive pericolonic inflammatory stranding. There is increasing pericholecystic fluid within the gallbladder fossa. Additionally, there is a tiny excrescence of fluid emanating from a defect within the gallbladder wall extending into the gallbladder fossa, best seen on image # 35/7 and axial image # 31/3, in keeping with perforated cholecystitis. The liver is unremarkable. No intra or extrahepatic biliary ductal dilation. Pancreas: Unremarkable Spleen: Unremarkable Adrenals/Urinary Tract: Adrenal glands are unremarkable. Kidneys are normal, without renal calculi, focal lesion, or hydronephrosis. Bladder is unremarkable. Stomach/Bowel: Mild sigmoid diverticulosis. The stomach, small bowel, and large bowel are otherwise unremarkable. Appendix normal. No free intraperitoneal gas or fluid. Vascular/Lymphatic: Aortic atherosclerosis. No enlarged abdominal or pelvic lymph nodes. Reproductive: Uterus and bilateral adnexa are unremarkable. Other: No abdominal wall hernia. Musculoskeletal: No acute bone abnormality. L4-5 anterior lumbar fusion and right hemilaminectomy has been performed. No lytic or blastic bone lesions IMPRESSION: Acute, perforated cholecystitis with progressive pericholecystic inflammatory change since prior examination of 01/21/2021. Surgical consultation is advised. Mild sigmoid diverticulosis. Aortic Atherosclerosis (ICD10-I70.0). Electronically Signed   By: Helyn Numbers M.D.   On: 01/23/2021 00:23    Anti-infectives: Anti-infectives (From admission, onward)    Start     Dose/Rate Route Frequency Ordered Stop   01/23/21 0000  [MAR Hold]  piperacillin-tazobactam (ZOSYN) IVPB 3.375 g        (MAR Hold since Thu 01/23/2021 at 1507.Hold Reason: Transfer to a Procedural area)   3.375 g 12.5 mL/hr over 240 Minutes Intravenous Every 8 hours 01/22/21 1737     01/22/21 1830  piperacillin-tazobactam (ZOSYN) IVPB 3.375 g        3.375 g 100 mL/hr over 30 Minutes Intravenous   Once 01/22/21 1737 01/22/21 1912       Assessment/Plan: Acute cholecystitis - for laparoscopic cholecystectomy, possible IOC. Procedure, risks, and benefits discussed and she agrees. Zosyn IV.   LOS: 0 days    Violeta Gelinas, MD, MPH, FACS Trauma & General Surgery Use [email or web address] to contact on call provider  01/23/2021

## 2021-01-23 NOTE — Op Note (Signed)
01/23/2021  6:28 PM  PATIENT:  Nancy [name]  65 y.o. female  PRE-OPERATIVE DIAGNOSIS:  Acute Cholecystitis  POST-OPERATIVE DIAGNOSIS:  Acute Cholecystitis with empyema of the gallbladder and perforation  PROCEDURE:  Procedure(s): Laparoscopic subtotal cholecystectomy Laparoscopic drainage of intra-abdominal abscess  SURGEON:  Violeta Gelinas, MD  ASSISTANTS: Carman Ching, MD   ANESTHESIA:   local and general  EBL:  Total I/O In: 674.8 [I.V.:516.7; IV Piggyback:158.1] Out: -   BLOOD ADMINISTERED:none  DRAINS: (1RUQ) Jackson-Pratt drain(s) with closed bulb suction in the RUQ    SPECIMEN:  Excision  DISPOSITION OF SPECIMEN:  PATHOLOGY  COUNTS:  YES  DICTATION: .Dragon Dictation Findings: Severe acute cholecystitis with empyema of the gallbladder and perforation contained by omentum.  Severe inflammation of the transverse colon and duodenum adherent to the gallbladder.  Procedure in detail: Informed consent was obtained.  She was identified in the preop holding area.  She is currently on IV antibiotics.  She was brought the operating room and general endotracheal anesthesia was administered by the anesthesia staff.  Her abdomen was prepped and draped in sterile fashion.  Timeout procedure was performed.The infraumbilical region was infiltrated with local. Infraumbilical incision was made. Subcutaneous tissues were dissected down revealing the anterior fascia. This was divided sharply along the midline. Peritoneal cavity was entered under direct vision without complication. A 0 Vicryl pursestring was placed around the fascial opening. Hassan trocar was inserted into the abdomen. The abdomen was insufflated with carbon dioxide in standard fashion. Under direct vision a 5 mm epigastric and 5 mm right abdominal port x2 were placed.  Local was used at each port site.  Laparoscopic exploration revealed that the gallbladder was not visible at all.  The transverse colon and omentum laid  over the top of the gallbladder completely obscuring it.  There was dense acute inflammation.  I began a very slow and meticulous dissection to gradually expose the dome of the gallbladder and safely sweep away the colon.  The duodenum was also adherent medially with severe dense inflammation.  After extensive dissection I was able to expose the dome and body of the gallbladder.  The gallbladder had perforated with frank pus into an abscess collection covered up by the omentum.  This was fully washed out and drained.  The severe inflammation precluded any further safe dissection whatsoever.  The colon and duodenum area were closely inspected and there were no injuries there.  Decision was made to do subtotal cholecystectomy.  I used cautery to open the dome of the gallbladder.  I extended this incision in the gallbladder where I could see it safely.  I then gently opened the gallbladder to look inside.  There were multiple large gallstones.  I upsized the epigastric port to 11 mm.  I then placed a large stone scoop or to gradually remove all of the stones from the gallbladder.  Once I removed all of them that we could see, I continued my incision of the gallbladder where I could remove it safely dividing the wall with cautery.  I used the cautery to get excellent hemostasis.  This portion of the anterior wall and dome was placed in a bag and sent to pathology.  There was not very much gallbladder remaining, essentially the infundibulum.  We were able to used suction and washout several more stones which were removed using the stone grasper.  We then thoroughly irrigated the area.  Meticulous hemostasis was obtained.  No further stones were seen and we inspected  the gallbladder remnant very closely.  A 19 [place] Blake drain was placed in the gallbladder fossa and brought out through the right lateral port site.  It was secured to the skin with nylon.  There was excellent hemostasis.  Ports were removed under direct  vision.  Pneumoperitoneum was released.  The infraumbilical fascia was closed by tying the pursestring.  All 3 remaining wounds were closed with 4-0 Vicryl followed by Dermabond.  All counts were correct.  She tolerated the procedure well without apparent complication and was taken recovery in stable condition.  PATIENT DISPOSITION:  PACU - hemodynamically stable.   Delay start of Pharmacological VTE agent (>24hrs) due to surgical blood loss or risk of bleeding:  no  Violeta Gelinas, MD, MPH, FACS Pager: [phone]  11/17/20226:28 PM

## 2021-01-23 NOTE — Anesthesia Preprocedure Evaluation (Addendum)
Anesthesia Evaluation  Patient identified by MRN, date of birth, ID band Patient awake    Reviewed: Allergy & Precautions, NPO status , Patient's Chart, lab work & pertinent test results  Airway Mallampati: II  TM Distance: >3 FB Neck ROM: Full    Dental no notable dental hx. (+) Dental Advisory Given, Edentulous Upper   Pulmonary sleep apnea ,    Pulmonary exam normal breath sounds clear to auscultation       Cardiovascular hypertension, Pt. on medications Normal cardiovascular exam Rhythm:Regular Rate:Normal     Neuro/Psych negative neurological ROS  negative psych ROS   GI/Hepatic GERD  Medicated,  Endo/Other  Hypothyroidism Morbid obesity (BMI 43.1)  Renal/GU Lab Results      Component                Value               Date                      CREATININE               0.98                01/23/2021                BUN                      10                  01/23/2021                NA                       130 (L)             01/23/2021                K                        3.6                 01/23/2021                CL                       96 (L)              01/23/2021                CO2                      26                  01/23/2021                Musculoskeletal negative musculoskeletal ROS (+)   Abdominal   Peds  Hematology  (+) anemia , Lab Results      Component                Value               Date                      WBC  5.2                 01/23/2021                HGB                      8.9 (L)             01/23/2021                HCT                      29.2 (L)            01/23/2021                MCV                      75.8 (L)            01/23/2021                PLT                      310                 01/23/2021              Anesthesia Other Findings   Reproductive/Obstetrics                            Anesthesia  Physical Anesthesia Plan  ASA: 3  Anesthesia Plan: General   Post-op Pain Management:    Induction: Intravenous  PONV Risk Score and Plan: 4 or greater and Treatment may vary due to age or medical condition, Ondansetron, Midazolam and Dexamethasone  Airway Management Planned: Oral ETT  Additional Equipment: None  Intra-op Plan:   Post-operative Plan: Extubation in OR  Informed Consent: I have reviewed the patients History and Physical, chart, labs and discussed the procedure including the risks, benefits and alternatives for the proposed anesthesia with the patient or authorized representative who has indicated his/her understanding and acceptance.     Dental advisory given  Plan Discussed with: CRNA and Anesthesiologist  Anesthesia Plan Comments:        Anesthesia Quick Evaluation

## 2021-01-23 NOTE — Progress Notes (Signed)
CT Abdomen and Pelvis with contrast completed. Impression: Acute, perforated cholecystitis with progressive pericholecystic inflammatory change since prior examination of 01/21/2021. Spoke to [employer], Dr.Ramirez, and he will review CT.   Thurmon Fair, MD PGY3 Internal Medicine

## 2021-01-23 NOTE — Transfer of Care (Signed)
Immediate Anesthesia Transfer of Care Note  Patient: Nancy [name]  Procedure(s) Performed: LAPAROSCOPIC CHOLECYSTECTOMY (Abdomen)  Patient Location: PACU  Anesthesia Type:General  Level of Consciousness: sedated  Airway & Oxygen Therapy: Patient Spontanous Breathing and Patient connected to face mask oxygen  Post-op Assessment: Report given to RN and Post -op Vital signs reviewed and stable  Post vital signs: Reviewed and stable  Last Vitals:  Vitals Value Taken Time  BP 167/95 01/23/21 1901  Temp    Pulse 83 01/23/21 1904  Resp 27 01/23/21 1904  SpO2 100 % 01/23/21 1904  Vitals shown include unvalidated device data.  Last Pain:  Vitals:   01/23/21 1035  TempSrc:   PainSc: 0-No pain      Patients Stated Pain Goal: 2 (01/22/21 1711)  Complications: No notable events documented.

## 2021-01-23 NOTE — Progress Notes (Signed)
Subjective/Chief Complaint: Patient still with RUQ tenderness and bloating  CT scan shows a very inflamed gallbladder, possibly gangrenous.  This is unexpected in light of a relatively unremarkable ultrasound, normal WBC, and normal LFT's.  However, this explains her symptoms.   Objective: Vital signs in last 24 hours: Temp:  [98 F (36.7 C)-98.7 F (37.1 C)] 98 F (36.7 C) (11/17 0727) Pulse Rate:  [65-90] 75 (11/17 0727) Resp:  [18] 18 (11/17 0727) BP: (151-194)/(73-87) 194/87 (11/17 0727) SpO2:  [96 %-99 %] 98 % (11/17 0727) Last BM Date: 01/22/21  Intake/Output from previous day: 11/16 0701 - 11/17 0700 In: 1233.8 [I.V.:1183.8; IV Piggyback:50] Out: -  Intake/Output this shift: No intake/output data recorded.  General appearance: alert, cooperative, and no distress GI: tender in RUQ radiating around to her back  Lab Results:  Recent Labs    01/22/21 1845 01/23/21 0121  WBC 6.0 5.2  HGB 10.1* 8.9*  HCT 32.6* 29.2*  PLT 266 310   BMET Recent Labs    01/22/21 1845 01/23/21 0121  NA 135 130*  K 4.0 3.6  CL 102 96*  CO2 24 26  GLUCOSE 97 102*  BUN 11 10  CREATININE 0.98 0.98  CALCIUM 8.8* 8.3*   Hepatic Function Latest Ref Rng & Units 01/23/2021 01/22/2021 01/21/2021  Total Protein 6.5 - 8.1 g/dL 6.7 7.4 7.4  Albumin 3.5 - 5.0 g/dL 2.7(L) 3.0(L) 3.0(L)  AST 15 - 41 U/L 13(L) 18 18  ALT 0 - 44 U/L _0 Alk Phosphatase 38 - 126 U/L 57 60 69  Total Bilirubin 0.3 - 1.2 mg/dL 0.6 0.5 0.5  Bilirubin, Direct 0.00 - 0.40 mg/dL - - -    PT/INR No results for input(s): LABPROT, INR in the last 72 hours. ABG No results for input(s): PHART, HCO3 in the last 72 hours.  Invalid input(s): PCO2, PO2  Studies/Results: CT ABDOMEN PELVIS W CONTRAST  Result Date: 01/23/2021 CLINICAL DATA:  Cholecystitis EXAM: CT ABDOMEN AND PELVIS WITH CONTRAST TECHNIQUE: Multidetector CT imaging of the abdomen and pelvis was performed using the standard protocol  following bolus administration of intravenous contrast. CONTRAST:  126m OMNIPAQUE IOHEXOL 350 MG/ML SOLN COMPARISON:  04/11/2014 FINDINGS: Lower chest: Mild bibasilar atelectasis. Cardiac size is within normal limits. No pericardial effusion. Hepatobiliary: Since the prior examination, the gallbladder has become increasingly thick walled and demonstrates progressive pericolonic inflammatory stranding. There is increasing pericholecystic fluid within the gallbladder fossa. Additionally, there is a tiny excrescence of fluid emanating from a defect within the gallbladder wall extending into the gallbladder fossa, best seen on image # 35/7 and axial image # 31/3, in keeping with perforated cholecystitis. The liver is unremarkable. No intra or extrahepatic biliary ductal dilation. Pancreas: Unremarkable Spleen: Unremarkable Adrenals/Urinary Tract: Adrenal glands are unremarkable. Kidneys are normal, without renal calculi, focal lesion, or hydronephrosis. Bladder is unremarkable. Stomach/Bowel: Mild sigmoid diverticulosis. The stomach, small bowel, and large bowel are otherwise unremarkable. Appendix normal. No free intraperitoneal gas or fluid. Vascular/Lymphatic: Aortic atherosclerosis. No enlarged abdominal or pelvic lymph nodes. Reproductive: Uterus and bilateral adnexa are unremarkable. Other: No abdominal wall hernia. Musculoskeletal: No acute bone abnormality. L4-5 anterior lumbar fusion and right hemilaminectomy has been performed. No lytic or blastic bone lesions IMPRESSION: Acute, perforated cholecystitis with progressive pericholecystic inflammatory change since prior examination of 01/21/2021. Surgical consultation is advised. Mild sigmoid diverticulosis. Aortic Atherosclerosis (ICD10-I70.0). Electronically Signed   By: AFidela SalisburyM.D.   On: 01/23/2021 00:23   UKoreaAbdomen Limited RUQ (  LIVER/GB)  Result Date: 01/21/2021 CLINICAL DATA:  Right upper quadrant pain EXAM: ULTRASOUND ABDOMEN LIMITED RIGHT  UPPER QUADRANT COMPARISON:  None. FINDINGS: Gallbladder: Gallstones are present, largest measures up to 1.6 cm. No gallbladder wall thickening. Positive sonographic Murphy sign noted by sonographer. Common bile duct: Diameter: 2 mm Liver: No focal lesion identified. Normal parenchymal echogenicity. Portal vein is patent on color Doppler imaging with normal direction of blood flow towards the liver. Other: None. IMPRESSION: Numerous gallstones are present with a positive sonographic Murphy sign noted by sonographer, although there is no evidence of gallbladder wall thickening. Findings are suggestive of acute cholecystitis. HIDA scan could be performed for further evaluation if clinically indicated. These results will be called to the ordering clinician or representative by the Radiologist Assistant, and communication documented in the PACS or [employer]. Electronically Signed   By: Yetta Glassman M.D.   On: 01/21/2021 13:13    Anti-infectives: Anti-infectives (From admission, onward)    Start     Dose/Rate Route Frequency Ordered Stop   01/23/21 0000  piperacillin-tazobactam (ZOSYN) IVPB 3.375 g        3.375 g 12.5 mL/hr over 240 Minutes Intravenous Every 8 hours 01/22/21 1737     01/22/21 1830  piperacillin-tazobactam (ZOSYN) IVPB 3.375 g        3.375 g 100 mL/hr over 30 Minutes Intravenous  Once 01/22/21 1737 01/22/21 1912       Assessment/Plan: Acute calculus cholecystitis  Recommend laparoscopic cholecystectomy today.  The surgical procedure has been discussed with the patient.  Potential risks, benefits, alternative treatments, and expected outcomes have been explained.  All of the patient's questions at this time have been answered.  The likelihood of reaching the patient's treatment goal is good.  The patient understand the proposed surgical procedure and wishes to proceed.  This surgery may be performed by one of my partners depending on the availability in the operating room.   The patient has been informed of this.  She is on Zosyn. Strict NPO    LOS: 0 days    [name] 01/23/2021

## 2021-01-23 NOTE — Progress Notes (Signed)
Pacu RN Report to floor given  Gave report to [employer]. 6N17. Discussed surgery, meds given in OR and Pacu, VS, IV fluids given, EBL, urine output, pain and other pertinent information. Also discussed if pt had any family or friends here or belongings with them.   One JP drain w/ 160 ml output of dilute bright red blood, 4 lapsites w/ dermabond, CDI, no bleeding or hematoma noted.   Pt exits my care.

## 2021-01-23 NOTE — Progress Notes (Signed)
   HD#1 SUBJECTIVE:  Patient Summary: Nancy [name] is a 65 y.o. with a pertinent PMH of HTN, hypothyroidism, OSA, and obesity who presented with RUQ abd pain and admitted for acute calculous, perforated cholecystitis.   Overnight Events: No acute events overnight  Interim History: This is hospital day 1 for Nancy [name] who was seen and evaluated at the bedside. Patient reports doing "okay". Reports a recent BM and passing flatus. Her pain is more tolerable than yesterday, but she is still in a significant amount of pain. Denies radiation of pain anywhere.   OBJECTIVE:  Vital Signs: Vitals:   01/22/21 1711 01/22/21 2136 01/23/21 0020 01/23/21 0537  BP: (!) 151/78 (!) 162/78 (!) 152/78 (!) 160/73  Pulse: 87 90 65 71  Resp: $Remo'18 18 18 18  'YsCJu$ Temp: 98.4 F (36.9 C) 98.6 F (37 C) 98.5 F (36.9 C) 98.7 F (37.1 C)  TempSrc: Oral Oral Axillary Oral  SpO2: 98% 99% 98% 96%   Supplemental O2: Room Air SpO2: 96 %  There were no vitals filed for this visit.  No intake or output data in the 24 hours ending 01/23/21 0649 Net IO Since Admission: No IO data has been entered for this period [01/23/21 0649]  Physical Exam: General:  No acute distress. Head: Normocephalic. Atraumatic. CV: RRR. No murmurs, rubs, or gallops. 1+ bilateral LE edema Pulmonary: Lungs CTAB. Normal effort on room air.  Abdominal: Soft, nondistended. Unable to appreciate bowel sounds. +murphy sign. Exquisite tenderness to palpation RUQ Extremities: Palpable radial and DP pulses. Normal ROM. Skin: Warm and dry. No obvious rash or lesions. Neuro: A&Ox3. No focal deficit. Psych: Normal mood and affect   ASSESSMENT/PLAN:  Assessment: Active Problems:   Cholecystitis, acute with cholelithiasis   Plan: #Acute calculous perforated cholecystitis #Cholelithiasis Patient was sent in as a direct admission from the clinic for RUQ abdominal pain and found to have numerous gallstones on ultrasound, with no gallbladder  wall thickening or ductal dilation. CT abd/pelvis noted acute, perforated cholecystitis with progressive pericholescystic inflammatory changes since prior ultrasound on 11/15 and surgery was made aware of these changes. CMP with normal AST/ALT, alk phos, and T bili. Plan for laparoscopic cholecystectomy this afternoon.  - Surgery consulted, appreciate recommendations - Continue Zosyn - Resumed home norco 7.5-324 mg q8h PRN - Dilaudid 0.5 mg q6h prn for severe, breakthrough pain, can increase if needed - LR 75 cc/hr for 10 hrs - Zofran 4 mg q6h prn (will check EKG)   #Hypertension Blood pressure elevated at 160/73 likely secondary to the severe pain the patient is experiencing, as well as need for further titration of her BP meds. Resumed home metoprolol 50 mg daily.  - Continue to monitor BP  Best Practice: Diet: NPO IVF: Fluids: LR, Rate:  75 cc/hr x 10 hrs VTE: Held Code: Full AB: Zosyn Therapy Recs: Pending Family Contact: Husband, to be notified DISPO: Anticipated discharge to Home pending Medical stability.  Signature: Buddy Duty, D.O.  Internal Medicine Resident, PGY-1 [facility]  Pager: [phone] 6:49 AM, 01/23/2021   Please contact the on call pager after 5 pm and on weekends at [phone].

## 2021-01-23 NOTE — Progress Notes (Signed)
Mobility Specialist Progress Note:   01/23/21 1430  Mobility  Activity Ambulated to bathroom  Level of Assistance Standby assist, set-up cues, supervision of patient - no hands on  Assistive Device Front wheel walker  Distance Ambulated (ft) 20 ft  Mobility Out of bed for toileting  Mobility Response Tolerated well  Mobility performed by Mobility specialist  $Mobility charge 1 Mobility   Pt asx during ambulation. C/o back pain which is baseline. Further mobility deferred d/t transport on the way for scheduled procedure. Will f/u tomorrow for further ambulation.   Addison Lank Mobility Specialist  Phone [phone]

## 2021-01-23 NOTE — Anesthesia Procedure Notes (Signed)
Procedure Name: Intubation Date/Time: 01/23/2021 4:21 PM Performed by: Janene Harvey, CRNA Pre-anesthesia Checklist: Patient identified, Emergency Drugs available, Suction available and Patient being monitored Patient Re-evaluated:Patient Re-evaluated prior to induction Oxygen Delivery Method: Circle system utilized Preoxygenation: Pre-oxygenation with 100% oxygen Induction Type: IV induction Ventilation: Mask ventilation without difficulty and Oral airway inserted - appropriate to patient size Laryngoscope Size: Mac and 4 Grade View: Grade II Tube type: Oral Tube size: 7.0 mm Number of attempts: 1 Airway Equipment and Method: Stylet and Oral airway Placement Confirmation: ETT inserted through vocal cords under direct vision, positive ETCO2 and breath sounds checked- equal and bilateral Secured at: 22 cm Tube secured with: Tape Dental Injury: Teeth and Oropharynx as per pre-operative assessment

## 2021-01-24 ENCOUNTER — Encounter (HOSPITAL_COMMUNITY): Payer: Self-pay | Admitting: General Surgery

## 2021-01-24 DIAGNOSIS — N179 Acute kidney failure, unspecified: Secondary | ICD-10-CM | POA: Diagnosis not present

## 2021-01-24 DIAGNOSIS — Z79890 Hormone replacement therapy (postmenopausal): Secondary | ICD-10-CM | POA: Diagnosis not present

## 2021-01-24 DIAGNOSIS — Z48815 Encounter for surgical aftercare following surgery on the digestive system: Secondary | ICD-10-CM | POA: Diagnosis not present

## 2021-01-24 DIAGNOSIS — K8000 Calculus of gallbladder with acute cholecystitis without obstruction: Secondary | ICD-10-CM | POA: Diagnosis not present

## 2021-01-24 DIAGNOSIS — Z981 Arthrodesis status: Secondary | ICD-10-CM | POA: Diagnosis not present

## 2021-01-24 DIAGNOSIS — K651 Peritoneal abscess: Secondary | ICD-10-CM | POA: Diagnosis not present

## 2021-01-24 DIAGNOSIS — Z4803 Encounter for change or removal of drains: Secondary | ICD-10-CM

## 2021-01-24 DIAGNOSIS — Z8041 Family history of malignant neoplasm of ovary: Secondary | ICD-10-CM | POA: Diagnosis not present

## 2021-01-24 DIAGNOSIS — I10 Essential (primary) hypertension: Secondary | ICD-10-CM | POA: Diagnosis not present

## 2021-01-24 DIAGNOSIS — E039 Hypothyroidism, unspecified: Secondary | ICD-10-CM | POA: Diagnosis not present

## 2021-01-24 DIAGNOSIS — Z20822 Contact with and (suspected) exposure to covid-19: Secondary | ICD-10-CM | POA: Diagnosis not present

## 2021-01-24 DIAGNOSIS — R1011 Right upper quadrant pain: Secondary | ICD-10-CM | POA: Diagnosis not present

## 2021-01-24 DIAGNOSIS — K219 Gastro-esophageal reflux disease without esophagitis: Secondary | ICD-10-CM | POA: Diagnosis present

## 2021-01-24 DIAGNOSIS — G4733 Obstructive sleep apnea (adult) (pediatric): Secondary | ICD-10-CM | POA: Diagnosis present

## 2021-01-24 DIAGNOSIS — Z888 Allergy status to other drugs, medicaments and biological substances status: Secondary | ICD-10-CM | POA: Diagnosis not present

## 2021-01-24 DIAGNOSIS — M797 Fibromyalgia: Secondary | ICD-10-CM | POA: Diagnosis present

## 2021-01-24 DIAGNOSIS — Z803 Family history of malignant neoplasm of breast: Secondary | ICD-10-CM | POA: Diagnosis not present

## 2021-01-24 DIAGNOSIS — Z79899 Other long term (current) drug therapy: Secondary | ICD-10-CM | POA: Diagnosis not present

## 2021-01-24 DIAGNOSIS — Z808 Family history of malignant neoplasm of other organs or systems: Secondary | ICD-10-CM | POA: Diagnosis not present

## 2021-01-24 DIAGNOSIS — Z8052 Family history of malignant neoplasm of bladder: Secondary | ICD-10-CM | POA: Diagnosis not present

## 2021-01-24 DIAGNOSIS — Z8349 Family history of other endocrine, nutritional and metabolic diseases: Secondary | ICD-10-CM | POA: Diagnosis not present

## 2021-01-24 DIAGNOSIS — Z6841 Body Mass Index (BMI) 40.0 and over, adult: Secondary | ICD-10-CM | POA: Diagnosis not present

## 2021-01-24 DIAGNOSIS — Z8249 Family history of ischemic heart disease and other diseases of the circulatory system: Secondary | ICD-10-CM | POA: Diagnosis not present

## 2021-01-24 DIAGNOSIS — Z91018 Allergy to other foods: Secondary | ICD-10-CM | POA: Diagnosis not present

## 2021-01-24 DIAGNOSIS — K82A2 Perforation of gallbladder in cholecystitis: Secondary | ICD-10-CM | POA: Diagnosis not present

## 2021-01-24 DIAGNOSIS — D62 Acute posthemorrhagic anemia: Secondary | ICD-10-CM | POA: Diagnosis not present

## 2021-01-24 LAB — COMPREHENSIVE METABOLIC PANEL WITH GFR
ALT: 16 U/L (ref 0–44)
AST: 30 U/L (ref 15–41)
Albumin: 2.8 g/dL — ABNORMAL LOW (ref 3.5–5.0)
Alkaline Phosphatase: 53 U/L (ref 38–126)
Anion gap: 5 (ref 5–15)
BUN: 12 mg/dL (ref 8–23)
CO2: 28 mmol/L (ref 22–32)
Calcium: 8.7 mg/dL — ABNORMAL LOW (ref 8.9–10.3)
Chloride: 103 mmol/L (ref 98–111)
Creatinine, Ser: 1.22 mg/dL — ABNORMAL HIGH (ref 0.44–1.00)
GFR, Estimated: 49 mL/min — ABNORMAL LOW
Glucose, Bld: 139 mg/dL — ABNORMAL HIGH (ref 70–99)
Potassium: 5 mmol/L (ref 3.5–5.1)
Sodium: 136 mmol/L (ref 135–145)
Total Bilirubin: 0.6 mg/dL (ref 0.3–1.2)
Total Protein: 7.3 g/dL (ref 6.5–8.1)

## 2021-01-24 LAB — CBC
HCT: 32.1 % — ABNORMAL LOW (ref 36.0–46.0)
Hemoglobin: 9.8 g/dL — ABNORMAL LOW (ref 12.0–15.0)
MCH: 23.6 pg — ABNORMAL LOW (ref 26.0–34.0)
MCHC: 30.5 g/dL (ref 30.0–36.0)
MCV: 77.2 fL — ABNORMAL LOW (ref 80.0–100.0)
Platelets: 318 10*3/uL (ref 150–400)
RBC: 4.16 MIL/uL (ref 3.87–5.11)
RDW: 14.7 % (ref 11.5–15.5)
WBC: 10.1 10*3/uL (ref 4.0–10.5)
nRBC: 0 % (ref 0.0–0.2)

## 2021-01-24 MED ORDER — OXYCODONE HCL 5 MG PO TABS
5.0000 mg | ORAL_TABLET | ORAL | Status: DC | PRN
  Administered 2021-01-24 – 2021-01-25 (×4): 10 mg via ORAL
  Filled 2021-01-24 (×4): qty 2

## 2021-01-24 MED ORDER — PHENOL 1.4 % MT LIQD: 1.0000 | OROMUCOSAL | Status: DC | PRN

## 2021-01-24 MED ORDER — HYDROMORPHONE HCL 1 MG/ML IJ SOLN
0.5000 mg | INTRAMUSCULAR | Status: DC | PRN
  Administered 2021-01-25: 0.5 mg via INTRAVENOUS
  Filled 2021-01-24: qty 0.5

## 2021-01-24 MED ORDER — ACETAMINOPHEN 500 MG PO TABS
1000.0000 mg | ORAL_TABLET | Freq: Four times a day (QID) | ORAL | Status: DC
  Administered 2021-01-24 – 2021-01-27 (×12): 1000 mg via ORAL
  Filled 2021-01-24 (×14): qty 2

## 2021-01-24 MED ORDER — ENOXAPARIN SODIUM 60 MG/0.6ML IJ SOSY
50.0000 mg | PREFILLED_SYRINGE | INTRAMUSCULAR | Status: DC
  Administered 2021-01-24 – 2021-01-26 (×3): 50 mg via SUBCUTANEOUS
  Filled 2021-01-24 (×3): qty 0.6

## 2021-01-24 MED ORDER — LIDOCAINE 5 % EX PTCH
1.0000 | MEDICATED_PATCH | CUTANEOUS | Status: DC
  Administered 2021-01-24 – 2021-01-27 (×4): 1 via TRANSDERMAL
  Filled 2021-01-24 (×4): qty 1

## 2021-01-24 MED ORDER — LACTATED RINGERS IV SOLN: INTRAVENOUS | Status: AC

## 2021-01-24 MED ORDER — SODIUM CHLORIDE 0.9 % IV SOLN
INTRAVENOUS | Status: DC | PRN
  Administered 2021-01-24: 10 mL/h via INTRAVENOUS

## 2021-01-24 NOTE — Progress Notes (Signed)
Mobility Specialist Progress Note:   01/24/21 1210  Mobility  Activity Sat and stood x 3 (x1)  Level of Assistance Contact guard assist, steadying assist  Assistive Device Front wheel walker  Mobility Response Tolerated poorly  Mobility performed by Mobility specialist  $Mobility charge 1 Mobility   Pt on 3LO2 upon arrival, states its for comfort. Attempted mobility on RA, pt desat to 89, recovered quickly with pursed lip breathing. Pt required contactG to stand d/t generalized weakness. Attempted to take a step, unsuccessful d/t significant abdominal pain. Further mobility deferred. Pt back in bed, on 2LO2 for comfort.   Addison Lank Mobility Specialist  Phone [phone]

## 2021-01-24 NOTE — Progress Notes (Signed)
Internal Medicine Clinic Attending  Case discussed with Dr. Katsadouros at the time of the visit.  We reviewed the resident's history and exam and pertinent patient test results.  I agree with the assessment, diagnosis, and plan of care documented in the resident's note.  

## 2021-01-24 NOTE — Anesthesia Postprocedure Evaluation (Signed)
Anesthesia Post Note  Patient: Nancy [name]  Procedure(s) Performed: LAPAROSCOPIC CHOLECYSTECTOMY (Abdomen)     Patient location during evaluation: PACU Anesthesia Type: General Level of consciousness: awake and alert Pain management: pain level controlled Vital Signs Assessment: post-procedure vital signs reviewed and stable Respiratory status: spontaneous breathing, nonlabored ventilation, respiratory function stable and patient connected to nasal cannula oxygen Cardiovascular status: blood pressure returned to baseline and stable Postop Assessment: no apparent nausea or vomiting Anesthetic complications: no   No notable events documented.  Last Vitals:  Vitals:   01/24/21 0512 01/24/21 0936  BP: 120/68 (!) 145/67  Pulse: 60 (!) 57  Resp: 18 16  Temp: 36.6 C 36.4 C  SpO2: 100% 99%    Last Pain:  Vitals:   01/24/21 1037  TempSrc:   PainSc: 3                  Yennifer Segovia S

## 2021-01-24 NOTE — Progress Notes (Addendum)
HD#2 SUBJECTIVE:  Patient Summary: Nancy [name] is a 65 y.o. with a pertinent PMH of HTN, hypothyroidism, OSA, and obesity who presented with RUQ abd pain and admitted for acute calculous, perforated cholecystitis.   Overnight Events: No acute events overnight  Interim History: This is hospital day 2 for Nancy [name] who was seen and evaluated at the bedside this morning. She reports that she feels her surgery went well. She reports RUQ abdominal pain and slight shortness of breath. The pain medicine is helping her, however, she feels like she needs more. Does not have any acute complaints at this time.   OBJECTIVE:  Vital Signs: Vitals:   01/23/21 1955 01/23/21 2000 01/23/21 2024 01/24/21 0512  BP:  136/71 (!) 142/71 120/68  Pulse: 69 66 70 60  Resp: 11 13 18 18   Temp: 97.8 F (36.6 C) 97.8 F (36.6 C) 97.8 F (36.6 C) 97.9 F (36.6 C)  TempSrc:   Oral Oral  SpO2: 99% 95% 98% 100%  Weight:      Height:       Supplemental O2: Nasal Cannula SpO2: 100 % O2 Flow Rate (L/min): 2 L/min  Filed Weights   01/23/21 1518  Weight: 110.4 kg     Intake/Output Summary (Last 24 hours) at 01/24/2021 0550 Last data filed at 01/24/2021 0515 Gross per 24 hour  Intake 2613.71 ml  Output 440 ml  Net 2173.71 ml   Net IO Since Admission: 2,738.57 mL [01/24/21 0550]  Physical Exam: General: No acute distress. CV: RRR. No murmurs, rubs, or gallops. 1+ LE edema Pulmonary: Lungs CTAB. Normal effort on 2 L [place].  Abdominal: Soft, nondistended. Normal bowel sounds. Tender to palpation over RUQ and midline abd. Surgical sites/incisions are clean, dry, and intact. Dressing over RUQ drain has yellow fluid soaking it. RUQ drain with dark, serosanginous output.  Extremities: Palpable radial and DP pulses. Normal ROM. Skin: Warm and dry. No obvious rash or lesions. Neuro: A&Ox3. No focal deficit. Psych: Normal mood and affect     ASSESSMENT/PLAN:  Assessment: Active Problems:    Cholecystitis, acute with cholelithiasis  Plan: #Acute calculous, perforated cholecystitis #S/p laparoscopic subtotal cholecystectomy with RUQ drain Patient is post-op day 1 following laparoscopic subtotal cholecystectomy with RUQ drain placement. During surgery, noted to have severe acute cholecystitis with empyema of the gallbladder and perforation contained by omentum. Also noted to have severe inflammation of the transverse colon and duodenum, adherent to the gallbladder wall, thus making total cholecystectomy difficult.  - Surgery consulted, appreciate recommendations - Continue Zosyn for 48 hrs post-op, will transition to po abx after - Resumed home norco 7.5-324 mg q8h PRN - Dilaudid 0.5 mg q3h prn for severe pain - LR 75 cc/hr for 10 hrs - Zofran 4 mg q6h prn (will check EKG)   #Hypertension Systolic blood pressure between 120-140s today, improved since her surgery and now that her pain is better controlled. Continue home metoprolol 50 mg daily.  - Continue to monitor BP  #AKI Cr 1.22 today, up from 0.9 yesterday. Patient is receiving LR 100 cc/hr and will continue to monitor. - Avoid nephrotoxic agents - Monitor BMP  Best Practice: Diet: Clear liquid diet IVF: Fluids: LR, Rate: 100 cc/hr x 8 hrs VTE: Lovenox Code: Full AB: Zosyn Therapy Recs: Pending DISPO: Anticipated discharge to Home pending Medical stability.  Signature: 09-21-1972, D.O.  Internal Medicine Resident, PGY-1 [facility]  Pager: [phone] 5:50 AM, 01/24/2021   Please contact the on  call pager after 5 pm and on weekends at [phone].

## 2021-01-24 NOTE — Progress Notes (Signed)
Progress Note  1 Day Post-Op  Subjective: Having some post op pain this morning but she has not been utilizing pain medications. We discussed pain medications and management. No nausea or emesis with water - has not had clears beyond this. No flatus or BM yet. Has not ambulated.  Objective: Vital signs in last 24 hours: Temp:  [97.4 F (36.3 C)-97.9 F (36.6 C)] 97.9 F (36.6 C) (11/18 0512) Pulse Rate:  [60-82] 60 (11/18 0512) Resp:  [9-18] 18 (11/18 0512) BP: (120-167)/(68-95) 120/68 (11/18 0512) SpO2:  [87 %-100 %] 100 % (11/18 0512) Weight:  [110.4 kg] 110.4 kg (11/17 1518) Last BM Date: 01/22/21  Intake/Output from previous day: 11/17 0701 - 11/18 0700 In: 1944.8 [P.O.:270; I.V.:1516.7; IV Piggyback:158.1] Out: 440 [Drains:420; Blood:20] Intake/Output this shift: No intake/output data recorded.  PE: General: pleasant, WD, female who is laying in bed in NAD HEENT: head is normocephalic, atraumatic. Mouth is pink and moist Heart: regular, rate, and rhythm. Palpable radial pulses bilaterally Lungs: CTAB.  Respiratory effort nonlabored on supplemental O2 via [place] Abd: soft, ND, +BS, Moderate TTP greatest over midline without rebound or guarding. Lap incisions c/d/I. Drain with yellow fluid on dressing and with dark sanguinous fluid in bulb with possible bile tinge MSK: all 4 extremities are symmetrical with no cyanosis, clubbing, or edema. Skin: warm and dry with no masses, lesions, or rashes Psych: A&Ox3 with an appropriate affect.    Lab Results:  Recent Labs    01/23/21 0121 01/24/21 0134  WBC 5.2 10.1  HGB 8.9* 9.8*  HCT 29.2* 32.1*  PLT 310 318   BMET Recent Labs    01/23/21 0121 01/24/21 0134  NA 130* 136  K 3.6 5.0  CL 96* 103  CO2 26 28  GLUCOSE 102* 139*  BUN 10 12  CREATININE 0.98 1.22*  CALCIUM 8.3* 8.7*   PT/INR No results for input(s): LABPROT, INR in the last 72 hours. CMP     Component Value Date/Time   NA 136 01/24/2021 0134   NA  140 12/16/2020 1002   K 5.0 01/24/2021 0134   CL 103 01/24/2021 0134   CO2 28 01/24/2021 0134   GLUCOSE 139 (H) 01/24/2021 0134   BUN 12 01/24/2021 0134   BUN 11 12/16/2020 1002   CREATININE 1.22 (H) 01/24/2021 0134   CREATININE 0.89 02/19/2014 1000   CALCIUM 8.7 (L) 01/24/2021 0134   PROT 7.3 01/24/2021 0134   PROT 7.2 12/16/2020 1002   ALBUMIN 2.8 (L) 01/24/2021 0134   ALBUMIN 4.0 12/16/2020 1002   AST 30 01/24/2021 0134   ALT 16 01/24/2021 0134   ALKPHOS 53 01/24/2021 0134   BILITOT 0.6 01/24/2021 0134   BILITOT 0.6 12/16/2020 1002   GFRNONAA 49 (L) 01/24/2021 0134   GFRNONAA 71 02/19/2014 1000   GFRAA >60 09/06/2019 0940   GFRAA 81 02/19/2014 1000   Lipase     Component Value Date/Time   LIPASE 39 01/20/2021 1034       Studies/Results: CT ABDOMEN PELVIS W CONTRAST  Result Date: 01/23/2021 CLINICAL DATA:  Cholecystitis EXAM: CT ABDOMEN AND PELVIS WITH CONTRAST TECHNIQUE: Multidetector CT imaging of the abdomen and pelvis was performed using the standard protocol following bolus administration of intravenous contrast. CONTRAST:  OMNIPAQUE IOHEXOL 350 MG/ML SOLN COMPARISON:  04/11/2014 FINDINGS: Lower chest: Mild bibasilar atelectasis. Cardiac size is within normal limits. No pericardial effusion. Hepatobiliary: Since the prior examination, the gallbladder has become increasingly thick walled and demonstrates progressive pericolonic inflammatory stranding. There  is increasing pericholecystic fluid within the gallbladder fossa. Additionally, there is a tiny excrescence of fluid emanating from a defect within the gallbladder wall extending into the gallbladder fossa, best seen on image # 35/7 and axial image # 31/3, in keeping with perforated cholecystitis. The liver is unremarkable. No intra or extrahepatic biliary ductal dilation. Pancreas: Unremarkable Spleen: Unremarkable Adrenals/Urinary Tract: Adrenal glands are unremarkable. Kidneys are normal, without renal calculi,  focal lesion, or hydronephrosis. Bladder is unremarkable. Stomach/Bowel: Mild sigmoid diverticulosis. The stomach, small bowel, and large bowel are otherwise unremarkable. Appendix normal. No free intraperitoneal gas or fluid. Vascular/Lymphatic: Aortic atherosclerosis. No enlarged abdominal or pelvic lymph nodes. Reproductive: Uterus and bilateral adnexa are unremarkable. Other: No abdominal wall hernia. Musculoskeletal: No acute bone abnormality. L4-5 anterior lumbar fusion and right hemilaminectomy has been performed. No lytic or blastic bone lesions IMPRESSION: Acute, perforated cholecystitis with progressive pericholecystic inflammatory change since prior examination of 01/21/2021. Surgical consultation is advised. Mild sigmoid diverticulosis. Aortic Atherosclerosis (ICD10-I70.0). Electronically Signed   By: Helyn Numbers M.D.   On: 01/23/2021 00:23    Anti-infectives: Anti-infectives (From admission, onward)    Start     Dose/Rate Route Frequency Ordered Stop   01/23/21 0000  piperacillin-tazobactam (ZOSYN) IVPB 3.375 g        3.375 g 12.5 mL/hr over 240 Minutes Intravenous Every 8 hours 01/22/21 1737     01/22/21 1830  piperacillin-tazobactam (ZOSYN) IVPB 3.375 g        3.375 g 100 mL/hr over 30 Minutes Intravenous  Once 01/22/21 1737 01/22/21 1912        Assessment/Plan Acute calculus cholecystitis POD#1 Laparoscopic subtotal cholecystectomy, drainage of intra-abdominal abscess, drain placement Dr. Janee Morn 11/17 - afebrile, WBC nml. Given findings of abscess continue IV abx for now - tolerating clears so far but limited intake. Continue CLD for breakfast and then ADAT soft - continue drain and monitor output. Possible bile tinge this am - encouraged ambulation and incentive spirometry use - multimodal pain control  FEN: CLD ADAT soft. IVF LR @ 75 ml/hr ID: zozyn VTE: lovenox   LOS: 0 days    Eric Form, [facility] Surgery 01/24/2021, 8:45 AM Please see  Amion for pager number during day hours 7:00am-4:30pm

## 2021-01-25 DIAGNOSIS — R1011 Right upper quadrant pain: Secondary | ICD-10-CM

## 2021-01-25 DIAGNOSIS — K8000 Calculus of gallbladder with acute cholecystitis without obstruction: Secondary | ICD-10-CM | POA: Diagnosis not present

## 2021-01-25 LAB — URINALYSIS, ROUTINE W REFLEX MICROSCOPIC
Bacteria, UA: NONE SEEN
Bilirubin Urine: NEGATIVE
Glucose, UA: NEGATIVE mg/dL
Ketones, ur: 5 mg/dL — AB
Leukocytes,Ua: NEGATIVE
Nitrite: NEGATIVE
Protein, ur: NEGATIVE mg/dL
Specific Gravity, Urine: 1.032 — ABNORMAL HIGH (ref 1.005–1.030)
pH: 5 (ref 5.0–8.0)

## 2021-01-25 LAB — CBC
HCT: 27.5 % — ABNORMAL LOW (ref 36.0–46.0)
HCT: 31 % — ABNORMAL LOW (ref 36.0–46.0)
Hemoglobin: 8.4 g/dL — ABNORMAL LOW (ref 12.0–15.0)
Hemoglobin: 9.1 g/dL — ABNORMAL LOW (ref 12.0–15.0)
MCH: 23.6 pg — ABNORMAL LOW (ref 26.0–34.0)
MCH: 23 pg — ABNORMAL LOW (ref 26.0–34.0)
MCHC: 30.5 g/dL (ref 30.0–36.0)
MCHC: 29.4 g/dL — ABNORMAL LOW (ref 30.0–36.0)
MCV: 77.2 fL — ABNORMAL LOW (ref 80.0–100.0)
MCV: 78.5 fL — ABNORMAL LOW (ref 80.0–100.0)
Platelets: 322 10*3/uL (ref 150–400)
Platelets: 375 10*3/uL (ref 150–400)
RBC: 3.56 MIL/uL — ABNORMAL LOW (ref 3.87–5.11)
RBC: 3.95 MIL/uL (ref 3.87–5.11)
RDW: 14.9 % (ref 11.5–15.5)
RDW: 15.1 % (ref 11.5–15.5)
WBC: 9.8 10*3/uL (ref 4.0–10.5)
WBC: 9 10*3/uL (ref 4.0–10.5)
nRBC: 0 % (ref 0.0–0.2)
nRBC: 0 % (ref 0.0–0.2)

## 2021-01-25 LAB — BASIC METABOLIC PANEL WITH GFR
Anion gap: 7 (ref 5–15)
BUN: 17 mg/dL (ref 8–23)
CO2: 26 mmol/L (ref 22–32)
Calcium: 8.3 mg/dL — ABNORMAL LOW (ref 8.9–10.3)
Chloride: 101 mmol/L (ref 98–111)
Creatinine, Ser: 1.64 mg/dL — ABNORMAL HIGH (ref 0.44–1.00)
GFR, Estimated: 34 mL/min — ABNORMAL LOW
Glucose, Bld: 120 mg/dL — ABNORMAL HIGH (ref 70–99)
Potassium: 4 mmol/L (ref 3.5–5.1)
Sodium: 134 mmol/L — ABNORMAL LOW (ref 135–145)

## 2021-01-25 MED ORDER — METHOCARBAMOL 1000 MG/10ML IJ SOLN: 1000.0000 mg | Freq: Four times a day (QID) | INTRAVENOUS | Status: DC | PRN

## 2021-01-25 MED ORDER — SODIUM CHLORIDE 0.9 % IV BOLUS: 1000.0000 mL | Freq: Once | INTRAVENOUS | Status: DC

## 2021-01-25 MED ORDER — MAGIC MOUTHWASH
15.0000 mL | Freq: Four times a day (QID) | ORAL | Status: DC | PRN
  Filled 2021-01-25: qty 15

## 2021-01-25 MED ORDER — LIP MEDEX EX OINT
1.0000 "application " | TOPICAL_OINTMENT | Freq: Two times a day (BID) | CUTANEOUS | Status: DC
  Filled 2021-01-25 (×2): qty 7

## 2021-01-25 MED ORDER — BISACODYL 10 MG RE SUPP
10.0000 mg | Freq: Every day | RECTAL | Status: DC
  Administered 2021-01-25 – 2021-01-26 (×2): 10 mg via RECTAL
  Filled 2021-01-25 (×3): qty 1

## 2021-01-25 MED ORDER — GLUCERNA SHAKE PO LIQD
237.0000 mL | Freq: Two times a day (BID) | ORAL | Status: DC
  Administered 2021-01-25 – 2021-01-27 (×6): 237 mL via ORAL

## 2021-01-25 MED ORDER — PROCHLORPERAZINE EDISYLATE 10 MG/2ML IJ SOLN: 5.0000 mg | INTRAMUSCULAR | Status: DC | PRN

## 2021-01-25 MED ORDER — SIMETHICONE 40 MG/0.6ML PO SUSP
80.0000 mg | Freq: Four times a day (QID) | ORAL | Status: DC | PRN
  Filled 2021-01-25: qty 1.2

## 2021-01-25 MED ORDER — METHOCARBAMOL 1000 MG/10ML IJ SOLN
1000.0000 mg | Freq: Four times a day (QID) | INTRAVENOUS | Status: DC | PRN
  Filled 2021-01-25: qty 10

## 2021-01-25 MED ORDER — LACTATED RINGERS IV BOLUS: 1000.0000 mL | Freq: Once | INTRAVENOUS | Status: DC

## 2021-01-25 MED ORDER — CALCIUM POLYCARBOPHIL 625 MG PO TABS
625.0000 mg | ORAL_TABLET | Freq: Two times a day (BID) | ORAL | Status: DC
  Administered 2021-01-25 – 2021-01-27 (×5): 625 mg via ORAL
  Filled 2021-01-25 (×7): qty 1

## 2021-01-25 MED ORDER — METHOCARBAMOL 500 MG PO TABS: 1000.0000 mg | ORAL_TABLET | Freq: Four times a day (QID) | ORAL | Status: DC | PRN

## 2021-01-25 MED ORDER — ALUM & MAG HYDROXIDE-SIMETH 200-200-20 MG/5ML PO SUSP: 30.0000 mL | Freq: Four times a day (QID) | ORAL | Status: DC | PRN

## 2021-01-25 MED ORDER — SODIUM CHLORIDE 0.9 % IV SOLN: INTRAVENOUS | Status: AC

## 2021-01-25 NOTE — Progress Notes (Signed)
PT Cancellation Note  Patient Details Name: Nancy [name] MRN: [id number] DOB: Aug 07, 1955   Cancelled Treatment:    Reason Eval/Treat Not Completed: Patient declined, no reason specified Patient refused mobility this date due to recently returning to bed and feeling exhausted. PT will re-attempt at later date.   Nancy [name] A. Dan Humphreys PT, DPT Acute Rehabilitation Services Pager [phone] Office [phone]    Nancy [name] 01/25/2021, 4:58 PM

## 2021-01-25 NOTE — Progress Notes (Signed)
HD#3 Subjective:  Patient Summary: [name] is a 65 year old female with PMHx of hypertension, hypothyroidism, OSA on CPAP and obesity who presented with RUQ abdominal pain admitted for acute calculus perforated cholecystitis s/p laparoscopic subtotal cholecystectomy with drainage of intraabdominal abscess and drain placement on 11/17.   Overnight Events: No acute overnight events reported.  Interim History: Ms Collymore was evaluated at bedside this morning. She reports feeling well overall. Does have some abdominal pain but reports that it is well tolerated. She was able to tolerate clear liquid diet yesterday. Discussed advancing diet and continuing on IV antibiotics for which she is agreeable. She wants to shower.   Objective:  Vital signs in last 24 hours: Vitals:   01/24/21 0936 01/24/21 1617 01/24/21 2003 01/25/21 0542  BP: (!) 145/67 128/68 118/67 (!) 102/57  Pulse: (!) 57 (!) 55 (!) 57 (!) 58  Resp: 16 17 18 18   Temp: 97.6 F (36.4 C) 98.1 F (36.7 C) 97.6 F (36.4 C) 97.8 F (36.6 C)  TempSrc: Oral Oral Oral Oral  SpO2: 99% 98% 99% 93%  Weight:      Height:       Supplemental O2: Nasal Cannula SpO2: 93 % O2 Flow Rate (L/min): 2 L/min   Physical Exam:  Constitutional: well-appearing elderly female sitting in bed, in no acute distress HENT: normocephalic atraumatic, mucous membranes moist, anicteric sclerae Neck: supple Cardiovascular: regular rate and rhythm, no m/r/g Pulmonary/Chest: normal work of breathing on room air, lungs clear to auscultation bilaterally Abdominal: soft, mild distension; mild TTP at RUQ drain site without guarding; +BS MSK: normal bulk and tone Neurological: alert & oriented x 3, no apparent focal deficits Skin: warm and dry; abdominal incisions c/d/I without signs of infection Psych: behavior and mood wnl   Filed Weights   01/23/21 1518  Weight: 110.4 kg     Intake/Output Summary (Last 24 hours) at 01/25/2021 0701 Last  data filed at 01/25/2021 0029 Gross per 24 hour  Intake 1472.33 ml  Output 450 ml  Net 1022.33 ml   Net IO Since Admission: 3,760.9 mL [01/25/21 0701]  Pertinent Labs: CBC Latest Ref Rng & Units 01/25/2021 01/24/2021 01/23/2021  WBC 4.0 - 10.5 K/uL 9.8 10.1 5.2  Hemoglobin 12.0 - 15.0 g/dL 01/25/2021) 2.3(R) 8.9(L)  Hematocrit 36.0 - 46.0 % 27.5(L) 32.1(L) 29.2(L)  Platelets 150 - 400 K/uL 322 318 310    CMP Latest Ref Rng & Units 01/25/2021 01/24/2021 01/23/2021  Glucose 70 - 99 mg/dL 01/25/2021) 622(Q) 333(L)  BUN 8 - 23 mg/dL 17 12 10   Creatinine 0.44 - 1.00 mg/dL 456(Y) ) 5.63(S  Sodium 135 - 145 mmol/L 134(L) 136 130(L)  Potassium 3.5 - 5.1 mmol/L 4.0 5.0 3.6  Chloride 98 - 111 mmol/L 101 103 96(L)  CO2 22 - 32 mmol/L 26 28 26   Calcium 8.9 - 10.3 mg/dL 8.3(L) 8.7(L) 8.3(L)  Total Protein 6.5 - 8.1 g/dL - 7.3 6.7  Total Bilirubin 0.3 - 1.2 mg/dL - 0.6 0.6  Alkaline Phos 38 - 126 U/L - 53 57  AST 15 - 41 U/L - 30 13(L)  ALT 0 - 44 U/L - 16 11    Imaging: RUQ 9.37(D 01/21/2021 IMPRESSION: Numerous gallstones are present with a positive sonographic Murphy sign noted by sonographer, although there is no evidence of gallbladder wall thickening. Findings are suggestive of acute cholecystitis. HIDA scan could be performed for further evaluation if clinically indicated.  CT ABDOMEN PELVIS W CONTRAST 01/23/2021 IMPRESSION:  Acute, perforated cholecystitis with progressive pericholecystic inflammatory change since prior examination of 01/21/2021. Surgical consultation is advised. Mild sigmoid diverticulosis.  Assessment/Plan:   Principal Problem:   Cholecystitis, acute with cholelithiasis   Patient Summary: [name] is a 65 year old female with PMHx of hypertension, hypothyroidism, OSA on CPAP and obesity who presented with RUQ abdominal pain admitted for acute calculus perforated cholecystitis s/p laparoscopic subtotal cholecystectomy with drainage of intraabdominal  abscess and drain placement on 11/17.   Acute calculus perforated cholecystitis S/p laparoscopic subtotal cholecystectomy with drainage of intraabdominal abscess and drain placement POD 2 Patient is post op day 2 from her laparoscopic subtotal cholecystectomy with RUQ drain placement. Afberile and without leukocytosis. Continues to have serosanguinous drain output although improved from prior. She does endorse improvement of abdominal pain and improved appetite. Continued on IV antibiotics.  - General surgery consulted, appreciate recommendations - Continue IV Zosyn for total 5 day duration given intraabdominal abscess - Continue IV dilaudid and oxy prn for pain control - Advance diet as tolerated - Monitor drain output  - Trend CBC and fever curve  Acute kidney injury Baseline sCr ~1; trending up with sCr 1.64 this AM. Only has 350cc recorded urine output over past 24 hrs and no urine output recorded prior to this. She may have prerenal AKI in setting of hypovolemia although she is net 3.4L positive fluid balance vs postsurgical ATN. Bladder scan with 16ml urine noted by RN.  - IV NS 100cc/hr x10hr - Urinalysis - Trend renal function - Strict I&O - Avoid nephrotoxic agents   Hypothyroidism Continue home synthroid 67mcg daily  Acute on chronic microcytic anemia Patient on chronic normocytic anemia with baseline hemoglobin ~10, MCV 77.  Iron studies without iron deficiency anemia.  May have component of thalassemia. Noted to have hemoglobin dropped to 8.4 this morning.  Likely in setting of blood loss anemia from recent surgery. -Repeat CBC in p.m. -If hemoglobin continues to trend down, transfuse 1 unit PRBC  Diet:  Soft IVF: NS,100cc/hr VTE: Enoxaparin Code: Full PT/OT recs: Pending. TOC recs: Pending PT/OT recs  Prior to Admission Living Arrangement: Home Anticipated Discharge Location: Pending PT/OT recs Barriers to Discharge: Medical management  Dispo: Anticipated discharge  pending clinical improvement.   Harvie Heck, MD Internal Medicine Resident PGY-3 Pager# [phone]  Please contact the on call pager after 5 pm and on weekends at [phone].

## 2021-01-25 NOTE — Progress Notes (Signed)
Nancy [name] [id number] 12-Feb-1956  CARE TEAM:  PCP: Charise Killian, MD  Outpatient Care Team: Patient Care Team: Charise Killian, MD as PCP - General (Internal Medicine)  Inpatient Treatment Team: Treatment Team: Attending Provider: Velna Ochs, MD; Rounding Team: (Rounding), Imts - Orene Desanctis, MD; Consulting Physician: Edison Pace, Md, MD; Registered Nurse: Quincy Sheehan, RN; Social Worker: Emeterio Reeve, LCSW; Registered Nurse: Hezzie Bump, RN; Utilization Review: Alease Medina, RN; Case Manager: Bartholomew Crews, RN; Mobility Specialist: Therisa Doyne   Problem List:   Principal Problem:   Cholecystitis, acute with cholelithiasis   2 Days Post-Op  01/23/2021  POST-OPERATIVE DIAGNOSIS:  Acute Cholecystitis with empyema of the gallbladder and perforation   PROCEDURE:   Laparoscopic subtotal cholecystectomy Laparoscopic drainage of intra-abdominal abscess   SURGEON:  Georganna Skeans, MD   ASSISTANTS: Nedra Hai, MD     Assessment  Slowly stabilizing  [facility] Stay = 1 days)  Assessment/Plan Acute calculus cholecystitis POD#2 Laparoscopic subtotal cholecystectomy, drainage of intra-abdominal abscess, drain placement Dr. Grandville Silos 11/17 - afebrile, WBC remains WNL.  - Given findings of abscess continue IV abx x5d postop minimum - adv to soft diet - bowel regimen w fiber & PRNs - continue drain and monitor output.  - encouraged ambulation and incentive spirometry use - ask PT/OT to see ?SNF - multimodal pain control   FEN: ADAT. Inc Cr - I ordered LR 1 L bolus x1.  Defer more w/u to primary service  ID: zosyn x 5d postop minimum VTE: lovenox      20 minutes spent in review, evaluation, examination, counseling, and coordination of care.   I have reviewed this patient's available data, including medical history, events of note, physical examination and test results as part of my evaluation.  A significant portion of that time was spent in counseling.   Care during the described time interval was provided by me.  01/25/2021    Subjective: (Chief complaint)  Less pain Not much appetite   Objective:  Vital signs:  Vitals:   01/24/21 0936 01/24/21 1617 01/24/21 2003 01/25/21 0542  BP: (!) 145/67 128/68 118/67 (!) 102/57  Pulse: (!) 57 (!) 55 (!) 57 (!) 58  Resp: 16 17 18 18   Temp: 97.6 F (36.4 C) 98.1 F (36.7 C) 97.6 F (36.4 C) 97.8 F (36.6 C)  TempSrc: Oral Oral Oral Oral  SpO2: 99% 98% 99% 93%  Weight:      Height:        Last BM Date: 01/22/21  Intake/Output   Yesterday:  11/18 0701 - 11/19 0700 In: 1472.3 [P.O.:960; I.V.:462.3; IV Piggyback:50] Out: 450 [Urine:350; Drains:100] This shift:  No intake/output data recorded.  Bowel function:  Flatus: No  BM:  No  Drain: Serosanguinous   Physical Exam:  General: Pt awake/alert in no acute distress.  Tired but not toxic Eyes: PERRL, normal EOM.  Sclera clear.  No icterus Neuro: CN II-XII intact w/o focal sensory/motor deficits. Lymph: No head/neck/groin lymphadenopathy Psych:  No delerium/psychosis/paranoia.  Oriented x 4 HENT: Normocephalic, Mucus membranes moist.  No thrush Neck: Supple, No tracheal deviation.  No obvious thyromegaly Chest: No pain to chest wall compression.  Good respiratory excursion.  No audible wheezing CV:  Pulses intact.  Regular rhythm.  No major extremity edema MS: Normal AROM mjr joints.  No obvious deformity  Abdomen: Soft.  Mildy distended.  Tenderness at RUQ drain site mild .  No evidence of peritonitis.  No incarcerated hernias.  Ext:   No  deformity.  No mjr edema.  No cyanosis Skin: No petechiae / purpurea.  No major sores.  Warm and dry    Results:   Cultures: Recent Results (from the past 720 hour(s))  Resp Panel by RT-PCR (Flu A&B, Covid) Nasopharyngeal Swab     Status: None   Collection Time: 01/23/21 12:57 PM   Specimen: Nasopharyngeal Swab; Nasopharyngeal(NP) swabs in vial transport medium  Result  Value Ref Range Status   SARS Coronavirus 2 by RT PCR NEGATIVE NEGATIVE Final    Comment: (NOTE) SARS-CoV-2 target nucleic acids are NOT DETECTED.  The SARS-CoV-2 RNA is generally detectable in upper respiratory specimens during the acute phase of infection. The lowest concentration of SARS-CoV-2 viral copies this assay can detect is 138 copies/mL. A negative result does not preclude SARS-Cov-2 infection and should not be used as the sole basis for treatment or other patient management decisions. A negative result may occur with  improper specimen collection/handling, submission of specimen other than nasopharyngeal swab, presence of viral mutation(s) within the areas targeted by this assay, and inadequate number of viral copies(<138 copies/mL). A negative result must be combined with clinical observations, patient history, and epidemiological information. The expected result is Negative.  Fact Sheet for Patients:  EntrepreneurPulse.com.au  Fact Sheet for Healthcare Providers:  IncredibleEmployment.be  This test is no t yet approved or cleared by the [place] FDA and  has been authorized for detection and/or diagnosis of SARS-CoV-2 by FDA under an Emergency Use Authorization (EUA). This EUA will remain  in effect (meaning this test can be used) for the duration of the COVID-19 declaration under Section 564(b)(1) of the Act, 21 U.S.C.section 360bbb-3(b)(1), unless the authorization is terminated  or revoked sooner.       Influenza A by PCR NEGATIVE NEGATIVE Final   Influenza B by PCR NEGATIVE NEGATIVE Final    Comment: (NOTE) The Xpert Xpress SARS-CoV-2/FLU/RSV plus assay is intended as an aid in the diagnosis of influenza from Nasopharyngeal swab specimens and should not be used as a sole basis for treatment. Nasal washings and aspirates are unacceptable for Xpert Xpress SARS-CoV-2/FLU/RSV testing.  Fact Sheet for  Patients: EntrepreneurPulse.com.au  Fact Sheet for Healthcare Providers: IncredibleEmployment.be  This test is not yet approved or cleared by the [place] FDA and has been authorized for detection and/or diagnosis of SARS-CoV-2 by FDA under an Emergency Use Authorization (EUA). This EUA will remain in effect (meaning this test can be used) for the duration of the COVID-19 declaration under Section 564(b)(1) of the Act, 21 U.S.C. section 360bbb-3(b)(1), unless the authorization is terminated or revoked.  Performed at [facility], [place] [address], [place], [place] [address]     Labs: Results for orders placed or performed during the hospital encounter of 01/22/21 (from the past 48 hour(s))  Resp Panel by RT-PCR (Flu A&B, Covid) Nasopharyngeal Swab     Status: None   Collection Time: 01/23/21 12:57 PM   Specimen: Nasopharyngeal Swab; Nasopharyngeal(NP) swabs in vial transport medium  Result Value Ref Range   SARS Coronavirus 2 by RT PCR NEGATIVE NEGATIVE    Comment: (NOTE) SARS-CoV-2 target nucleic acids are NOT DETECTED.  The SARS-CoV-2 RNA is generally detectable in upper respiratory specimens during the acute phase of infection. The lowest concentration of SARS-CoV-2 viral copies this assay can detect is 138 copies/mL. A negative result does not preclude SARS-Cov-2 infection and should not be used as the sole basis for treatment or other patient management decisions. A negative  result may occur with  improper specimen collection/handling, submission of specimen other than nasopharyngeal swab, presence of viral mutation(s) within the areas targeted by this assay, and inadequate number of viral copies(<138 copies/mL). A negative result must be combined with clinical observations, patient history, and epidemiological information. The expected result is Negative.  Fact Sheet for Patients:   EntrepreneurPulse.com.au  Fact Sheet for Healthcare Providers:  IncredibleEmployment.be  This test is no t yet approved or cleared by the [place] FDA and  has been authorized for detection and/or diagnosis of SARS-CoV-2 by FDA under an Emergency Use Authorization (EUA). This EUA will remain  in effect (meaning this test can be used) for the duration of the COVID-19 declaration under Section 564(b)(1) of the Act, 21 U.S.C.section 360bbb-3(b)(1), unless the authorization is terminated  or revoked sooner.       Influenza A by PCR NEGATIVE NEGATIVE   Influenza B by PCR NEGATIVE NEGATIVE    Comment: (NOTE) The Xpert Xpress SARS-CoV-2/FLU/RSV plus assay is intended as an aid in the diagnosis of influenza from Nasopharyngeal swab specimens and should not be used as a sole basis for treatment. Nasal washings and aspirates are unacceptable for Xpert Xpress SARS-CoV-2/FLU/RSV testing.  Fact Sheet for Patients: EntrepreneurPulse.com.au  Fact Sheet for Healthcare Providers: IncredibleEmployment.be  This test is not yet approved or cleared by the [place] FDA and has been authorized for detection and/or diagnosis of SARS-CoV-2 by FDA under an Emergency Use Authorization (EUA). This EUA will remain in effect (meaning this test can be used) for the duration of the COVID-19 declaration under Section 564(b)(1) of the Act, 21 U.S.C. section 360bbb-3(b)(1), unless the authorization is terminated or revoked.  Performed at [facility], [place] [address] [place], [place] [address]   Comprehensive metabolic panel     Status: Abnormal   Collection Time: 01/24/21  1:34 AM  Result Value Ref Range   Sodium 136 135 - 145 mmol/L   Potassium 5.0 3.5 - 5.1 mmol/L    Comment: SLIGHT HEMOLYSIS   Chloride 103 98 - 111 mmol/L   CO2 28 22 - 32 mmol/L   Glucose, Bld 139 (H) 70 - 99 mg/dL    Comment: Glucose reference  range applies only to samples taken after fasting for at least 8 hours.   BUN 12 8 - 23 mg/dL   Creatinine, Ser 1.22 (H) 0.44 - 1.00 mg/dL   Calcium 8.7 (L) 8.9 - 10.3 mg/dL   Total Protein 7.3 6.5 - 8.1 g/dL   Albumin 2.8 (L) 3.5 - 5.0 g/dL   AST 30 15 - 41 U/L   ALT 16 0 - 44 U/L   Alkaline Phosphatase 53 38 - 126 U/L   Total Bilirubin 0.6 0.3 - 1.2 mg/dL   GFR, Estimated 49 (L) >60 mL/min    Comment: (NOTE) Calculated using the CKD-EPI Creatinine Equation (2021)    Anion gap 5 5 - 15    Comment: Performed at [facility], [place] [address], [place], [place] [address]  CBC     Status: Abnormal   Collection Time: 01/24/21  1:34 AM  Result Value Ref Range   WBC 10.1 4.0 - 10.5 K/uL   RBC 4.16 3.87 - 5.11 MIL/uL   Hemoglobin 9.8 (L) 12.0 - 15.0 g/dL   HCT 32.1 (L) 36.0 - 46.0 %   MCV 77.2 (L) 80.0 - 100.0 fL   MCH 23.6 (L) 26.0 - 34.0 pg   MCHC 30.5 30.0 - 36.0 g/dL  RDW 14.7 11.5 - 15.5 %   Platelets 318 150 - 400 K/uL   nRBC 0.0 0.0 - 0.2 %    Comment: Performed at [facility], [address] [place], [place] [address]  Basic metabolic panel     Status: Abnormal   Collection Time: 01/25/21  1:11 AM  Result Value Ref Range   Sodium 134 (L) 135 - 145 mmol/L   Potassium 4.0 3.5 - 5.1 mmol/L   Chloride 101 98 - 111 mmol/L   CO2 26 22 - 32 mmol/L   Glucose, Bld 120 (H) 70 - 99 mg/dL    Comment: Glucose reference range applies only to samples taken after fasting for at least 8 hours.   BUN 17 8 - 23 mg/dL   Creatinine, Ser 4.03 (H) 0.44 - 1.00 mg/dL   Calcium 8.3 (L) 8.9 - 10.3 mg/dL   GFR, Estimated 34 (L) >60 mL/min    Comment: (NOTE) Calculated using the CKD-EPI Creatinine Equation (2021)    Anion gap 7 5 - 15    Comment: Performed at [facility], [address] [place], [place] [address]  CBC     Status: Abnormal   Collection Time: 01/25/21  1:11 AM  Result Value Ref Range   WBC 9.8 4.0 - 10.5 K/uL   RBC 3.56 (L) 3.87 - 5.11 MIL/uL   Hemoglobin  8.4 (L) 12.0 - 15.0 g/dL    Comment: Reticulocyte Hemoglobin testing may be clinically indicated, consider ordering this additional test [id number]    HCT 27.5 (L) 36.0 - 46.0 %   MCV 77.2 (L) 80.0 - 100.0 fL   MCH 23.6 (L) 26.0 - 34.0 pg   MCHC 30.5 30.0 - 36.0 g/dL   RDW 43.3 29.5 - 18.8 %   Platelets 322 150 - 400 K/uL   nRBC 0.0 0.0 - 0.2 %    Comment: Performed at [facility], [address], [place], [place] [address]    Imaging / Studies: No results found.  Medications / Allergies: per chart  Antibiotics: Anti-infectives (From admission, onward)    Start     Dose/Rate Route Frequency Ordered Stop   01/23/21 0000  piperacillin-tazobactam (ZOSYN) IVPB 3.375 g        3.375 g 12.5 mL/hr over 240 Minutes Intravenous Every 8 hours 01/22/21 1737     01/22/21 1830  piperacillin-tazobactam (ZOSYN) IVPB 3.375 g        3.375 g 100 mL/hr over 30 Minutes Intravenous  Once 01/22/21 1737 01/22/21 1912         Note: Portions of this report may have been transcribed using voice recognition software. Every effort was made to ensure accuracy; however, inadvertent computerized transcription errors may be present.   Any transcriptional errors that result from this process are unintentional.    Ardeth Sportsman, MD, FACS, MASCRS Esophageal, Gastrointestinal & Colorectal Surgery Robotic and Minimally Invasive Surgery  [facility], [facility]  [facility]  [address], [address] [place], [place] [address] [phone] Fax [phone] Main  CONTACT INFORMATION:  Weekday (9AM-5PM): Call [facility] main office at [phone]  Weeknight (5PM-9AM) or Weekend/Holiday: Check [email or web address] (password " [email or web address]") for General Surgery [facility] coverage  (Please, do not use SecureChat as it is not reliable communication to operating surgeons for immediate patient care)      01/25/2021  8:11 AM

## 2021-01-25 NOTE — Progress Notes (Signed)
Mobility Specialist Progress Note:   01/25/21 0945  Mobility  Activity Ambulated in room;Transferred:  Bed to chair  Level of Assistance Contact guard assist, steadying assist  Assistive Device Front wheel walker  Distance Ambulated (ft) 15 ft  Mobility Out of bed to chair with meals;Ambulated with assistance in room  Mobility Response Tolerated well  Mobility performed by Mobility specialist  Bed Position Chair  $Mobility charge 1 Mobility   Pt c/o significantly less pain today. Required supervision for bed mobility and to stand. Contact G required for ambulation d/t generalized weakness. Pt displayed SOB during amb, SpO2 at 91% on RA. Pt left in chair, expecting PT later today.   Addison Lank Mobility Specialist  Phone [phone]

## 2021-01-25 NOTE — Progress Notes (Signed)
Pt stated she could place self on cpap when ready for bed. 

## 2021-01-26 ENCOUNTER — Inpatient Hospital Stay (HOSPITAL_COMMUNITY): Payer: Medicare HMO

## 2021-01-26 LAB — CBC
HCT: 27.7 % — ABNORMAL LOW (ref 36.0–46.0)
Hemoglobin: 8.5 g/dL — ABNORMAL LOW (ref 12.0–15.0)
MCH: 23.6 pg — ABNORMAL LOW (ref 26.0–34.0)
MCHC: 30.7 g/dL (ref 30.0–36.0)
MCV: 76.9 fL — ABNORMAL LOW (ref 80.0–100.0)
Platelets: 326 10*3/uL (ref 150–400)
RBC: 3.6 MIL/uL — ABNORMAL LOW (ref 3.87–5.11)
RDW: 15.1 % (ref 11.5–15.5)
WBC: 8.7 10*3/uL (ref 4.0–10.5)
nRBC: 0 % (ref 0.0–0.2)

## 2021-01-26 LAB — BASIC METABOLIC PANEL WITH GFR
Anion gap: 5 (ref 5–15)
BUN: 20 mg/dL (ref 8–23)
CO2: 31 mmol/L (ref 22–32)
Calcium: 8.3 mg/dL — ABNORMAL LOW (ref 8.9–10.3)
Chloride: 100 mmol/L (ref 98–111)
Creatinine, Ser: 1.66 mg/dL — ABNORMAL HIGH (ref 0.44–1.00)
GFR, Estimated: 34 mL/min — ABNORMAL LOW
Glucose, Bld: 106 mg/dL — ABNORMAL HIGH (ref 70–99)
Potassium: 4.2 mmol/L (ref 3.5–5.1)
Sodium: 136 mmol/L (ref 135–145)

## 2021-01-26 MED ORDER — POLYETHYLENE GLYCOL 3350 17 G PO PACK: 17.0000 g | PACK | Freq: Every day | ORAL | Status: DC

## 2021-01-26 MED ORDER — POLYETHYLENE GLYCOL 3350 17 G PO PACK
17.0000 g | PACK | Freq: Two times a day (BID) | ORAL | Status: DC
  Administered 2021-01-26: 17 g via ORAL
  Filled 2021-01-26 (×3): qty 1

## 2021-01-26 MED ORDER — SENNOSIDES-DOCUSATE SODIUM 8.6-50 MG PO TABS: 1.0000 | ORAL_TABLET | Freq: Every evening | ORAL | Status: DC | PRN

## 2021-01-26 MED ORDER — SODIUM CHLORIDE 0.9 % IV SOLN: INTRAVENOUS | Status: AC

## 2021-01-26 MED ORDER — DOCUSATE SODIUM 100 MG PO CAPS
100.0000 mg | ORAL_CAPSULE | Freq: Two times a day (BID) | ORAL | Status: DC
  Administered 2021-01-26 – 2021-01-27 (×3): 100 mg via ORAL
  Filled 2021-01-26 (×3): qty 1

## 2021-01-26 NOTE — Progress Notes (Signed)
Mobility Specialist Progress Note:   01/26/21 0950  Mobility  Activity Transferred to/from [facility];Ambulated in room  Level of Assistance Minimal assist, patient does 75% or more  Assistive Device Front wheel walker  Distance Ambulated (ft) 8 ft  Mobility Out of bed for toileting;Ambulated with assistance in room;Out of bed to chair with meals  Mobility Response Tolerated well  Mobility performed by Mobility specialist  Bed Position Chair  $Mobility charge 1 Mobility   Pt found on BSC upon arrival. Assisted with pericare. Pt required minA to stand and contactG with ambulation. Pt up in chair with chair alarm on.   Addison Lank Mobility Specialist  Phone [phone]

## 2021-01-26 NOTE — Progress Notes (Signed)
HD#2 Subjective:  Overnight Events: no event  Patient is seen at bedside. She is comfortable and in no acute distress. Patient reports pain is well controlled. She is passing gas but no BM yet. Report urinating normal without dysuria. She reports using bedside commode during daytime and purewick at night time.   Objective:  Vital signs in last 24 hours: Vitals:   01/25/21 1624 01/25/21 2016 01/26/21 0415 01/26/21 0729  BP: (!) 133/48 127/67 123/69 140/60  Pulse: 72 72 67 66  Resp: 17 20 18 16   Temp: 97.9 F (36.6 C) 98.5 F (36.9 C) 98.4 F (36.9 C) 98.3 F (36.8 C)  TempSrc:  Oral Oral Oral  SpO2: 94% 93% 95% 92%  Weight:      Height:       Supplemental O2: Room Air SpO2: 92 % O2 Flow Rate (L/min): 2 L/min   Physical Exam:  Physical Exam Constitutional:      General: She is not in acute distress.    Appearance: She is not toxic-appearing.  Eyes:     General:        Right eye: No discharge.        Left eye: No discharge.     Conjunctiva/sclera: Conjunctivae normal.  Cardiovascular:     Rate and Rhythm: Normal rate and regular rhythm.     Comments: Trace LE edema Pulmonary:     Effort: Pulmonary effort is normal. No respiratory distress.  Abdominal:     General: Bowel sounds are normal.     Tenderness: There is no abdominal tenderness.     Comments: RUQ drain in place. Site is clean, non-infected. Minimal red discharge in the bulb.   Musculoskeletal:     Comments: Right IV infiltrated.   Skin:    General: Skin is warm.     Coloration: Skin is not jaundiced.  Neurological:     General: No focal deficit present.     Mental Status: She is alert and oriented to person, place, and time.  Psychiatric:        Mood and Affect: Mood normal.        Behavior: Behavior normal.    Filed Weights   01/23/21 1518  Weight: 110.4 kg     Intake/Output Summary (Last 24 hours) at 01/26/2021 0825 Last data filed at 01/26/2021 0530 Gross per 24 hour  Intake 760 ml   Output 680 ml  Net 80 ml   Net IO Since Admission: 3,840.9 mL [01/26/21 0825]  Pertinent Labs: CBC Latest Ref Rng & Units 01/26/2021 01/25/2021 01/25/2021  WBC 4.0 - 10.5 K/uL 8.7 9.0 9.8  Hemoglobin 12.0 - 15.0 g/dL 8.5(L) 9.1(L) 8.4(L)  Hematocrit 36.0 - 46.0 % 27.7(L) 31.0(L) 27.5(L)  Platelets 150 - 400 K/uL 326 375 322    CMP Latest Ref Rng & Units 01/26/2021 01/25/2021 01/24/2021  Glucose 70 - 99 mg/dL 106(H) 120(H) 139(H)  BUN 8 - 23 mg/dL 20 17 12   Creatinine 0.44 - 1.00 mg/dL 1.66(H) 1.64(H) 1.22(H)  Sodium 135 - 145 mmol/L 136 134(L) 136  Potassium 3.5 - 5.1 mmol/L 4.2 4.0 5.0  Chloride 98 - 111 mmol/L 100 101 103  CO2 22 - 32 mmol/L 31 26 28   Calcium 8.9 - 10.3 mg/dL 8.3(L) 8.3(L) 8.7(L)  Total Protein 6.5 - 8.1 g/dL - - 7.3  Total Bilirubin 0.3 - 1.2 mg/dL - - 0.6  Alkaline Phos 38 - 126 U/L - - 53  AST 15 - 41 U/L - - 30  ALT 0 - 44 U/L - - 16    Imaging: No results found.  Assessment/Plan:   Principal Problem:   Cholecystitis, acute with cholelithiasis Active Problems:   RUQ abdominal pain  Patient Summary: [name] is a 65 year old female with PMHx of hypertension, hypothyroidism, OSA on CPAP and obesity who presented with RUQ abdominal pain admitted for acute calculus perforated cholecystitis s/p laparoscopic subtotal cholecystectomy with drainage of intraabdominal abscess and drain placement on 11/17.   Acute calculus perforated cholecystitis S/p laparoscopic subtotal cholecystectomy with drainage of intraabdominal abscess and drain placement POD 3 Afebrile and without leukocytosis. Minimal drain output in the last 24h.  Pain is well controlled. Only requires 1 dose of Oxy 10 mg and Dilaudid 0.5 mg ober night. She is passing gas but no BM yet. - Appreciate general surgery recommendations - Continue IV Zosyn for total 5 day post op given intraabdominal abscess - Continue IV dilaudid and oxy prn for pain control - Advance diet as tolerated -  Start bowel regimen - Monitor drain output  - Trend CBC and fever curve - Encourage patient to work with PT/OT   Acute kidney injury Baseline sCr ~1. Cr remains elevated at 1.6 after IV hydration yesterday. Only 600 cc recorded output. UA not consistent with UTI. Likely ATN vs pre-renal vs obstruction.  - Obtain renal US and urine sodium  - Trend renal function - Strict I&O - Avoid nephrotoxic agents    Hypothyroidism Continue home synthroid daily   Acute on chronic microcytic anemia Patient on chronic normocytic anemia with baseline hemoglobin ~10, MCV 77.  Iron studies without iron deficiency anemia.  May have component of thalassemia.  Hgb stable.  - CBC in AM   Diet:  Soft IVF: NA VTE: Enoxaparin Code: Full PT/OT recs: Pending. TOC recs: Pending PT/OT recs   Prior to Admission Living Arrangement: Home Anticipated Discharge Location: Pending PT/OT recs Barriers to Discharge: Medical management  Dispo: Anticipated discharge pending clinical improvement.   Doran Stabler, DO 01/26/2021, 8:25 AM Pager: [phone]  Please contact the on call pager after 5 pm and on weekends at [phone].

## 2021-01-26 NOTE — Progress Notes (Signed)
General Surgery Follow Up Note  Subjective:    Overnight Issues:   Objective:  Vital signs for last 24 hours: Temp:  [97.9 F (36.6 C)-98.5 F (36.9 C)] 98.3 F (36.8 C) (11/20 0729) Pulse Rate:  [66-72] 66 (11/20 0729) Resp:  [16-20] 16 (11/20 0729) BP: (123-140)/(48-69) 140/60 (11/20 0729) SpO2:  [92 %-95 %] 92 % (11/20 0729)  Hemodynamic parameters for last 24 hours:    Intake/Output from previous day: 11/19 0701 - 11/20 0700 In: 760 [P.O.:760] Out: 680 [Urine:600; Drains:80]  Intake/Output this shift: Total I/O In: -  Out: 400 [Urine:400]  Vent settings for last 24 hours:    Physical Exam:  Gen: comfortable, no distress Neuro: non-focal exam HEENT: PERRL Neck: supple CV: RRR Pulm: unlabored breathing Abd: soft, NT, incisions c/d/I, JP with serous o/p GU: clear yellow urine Extr: wwp, no edema   Results for orders placed or performed during the hospital encounter of 01/22/21 (from the past 24 hour(s))  CBC     Status: Abnormal   Collection Time: 01/25/21  2:33 PM  Result Value Ref Range   WBC 9.0 4.0 - 10.5 K/uL   RBC 3.95 3.87 - 5.11 MIL/uL   Hemoglobin 9.1 (L) 12.0 - 15.0 g/dL   HCT 32.9 (L) 19.1 - 66.0 %   MCV 78.5 (L) 80.0 - 100.0 fL   MCH 23.0 (L) 26.0 - 34.0 pg   MCHC 29.4 (L) 30.0 - 36.0 g/dL   RDW 60.0 45.9 - 97.7 %   Platelets 375 150 - 400 K/uL   nRBC 0.0 0.0 - 0.2 %  Urinalysis, Routine w reflex microscopic     Status: Abnormal   Collection Time: 01/25/21  4:30 PM  Result Value Ref Range   Color, Urine YELLOW YELLOW   APPearance HAZY (A) CLEAR   Specific Gravity, Urine 1.032 (H) 1.005 - 1.030   pH 5.0 5.0 - 8.0   Glucose, UA NEGATIVE NEGATIVE mg/dL   Hgb urine dipstick SMALL (A) NEGATIVE   Bilirubin Urine NEGATIVE NEGATIVE   Ketones, ur 5 (A) NEGATIVE mg/dL   Protein, ur NEGATIVE NEGATIVE mg/dL   Nitrite NEGATIVE NEGATIVE   Leukocytes,Ua NEGATIVE NEGATIVE   RBC / HPF 0-5 0 - 5 RBC/hpf   WBC, UA 0-5 0 - 5 WBC/hpf   Bacteria,  UA NONE SEEN NONE SEEN   Squamous Epithelial / LPF 0-5 0 - 5   Mucus PRESENT   Basic metabolic panel     Status: Abnormal   Collection Time: 01/26/21  1:52 AM  Result Value Ref Range   Sodium 136 135 - 145 mmol/L   Potassium 4.2 3.5 - 5.1 mmol/L   Chloride 100 98 - 111 mmol/L   CO2 31 22 - 32 mmol/L   Glucose, Bld 106 (H) 70 - 99 mg/dL   BUN 20 8 - 23 mg/dL   Creatinine, Ser 4.14 (H) 0.44 - 1.00 mg/dL   Calcium 8.3 (L) 8.9 - 10.3 mg/dL   GFR, Estimated 34 (L) >60 mL/min   Anion gap 5 5 - 15  CBC     Status: Abnormal   Collection Time: 01/26/21  1:52 AM  Result Value Ref Range   WBC 8.7 4.0 - 10.5 K/uL   RBC 3.60 (L) 3.87 - 5.11 MIL/uL   Hemoglobin 8.5 (L) 12.0 - 15.0 g/dL   HCT 23.9 (L) 53.2 - 02.3 %   MCV 76.9 (L) 80.0 - 100.0 fL   MCH 23.6 (L) 26.0 - 34.0 pg  MCHC 30.7 30.0 - 36.0 g/dL   RDW 63.1 49.7 - 02.6 %   Platelets 326 150 - 400 K/uL   nRBC 0.0 0.0 - 0.2 %    Assessment & Plan:  Present on Admission:  Cholecystitis, acute with cholelithiasis    LOS: 2 days   Additional comments:I reviewed the patient's new clinical lab test results.   and I reviewed the patients new imaging test results.    Acute calculus cholecystitis POD#3 Laparoscopic subtotal cholecystectomy, drainage of intra-abdominal abscess, drain placement Dr. Janee Morn 11/17 - afebrile, WBC remains WNL.  - Given findings of abscess continue IV abx x5d postop minimum - adv to soft diet - bowel regimen w fiber & PRNs, escalated further today - continue drain and monitor output.  - encouraged ambulation and incentive spirometry use - ask PT/OT to see ?SNF - multimodal pain control   FEN: ADAT. creat stable elevated, defer AKI management to primary team   ID: [email or web address] x 5d postop minimum VTE: lovenox  Diamantina Monks, MD Trauma & General Surgery Please use [email or web address] to contact on call provider  01/26/2021  *Care during the described time interval was provided by me. I have reviewed this  patient's available data, including medical history, events of note, physical examination and test results as part of my evaluation.

## 2021-01-26 NOTE — Evaluation (Signed)
Physical Therapy Evaluation Patient Details Name: Nancy [name] MRN: [id number] DOB: 11-Dec-1955 Today's Date: 01/26/2021  History of Present Illness  65 y/o female admitted with abdominal pain. Found to have cholecystitis with cholelithiasis. S/p cholecystectomy 01/23/21. Pt w/ hx of obesity, chronic low back pain, BLE edema, polyarthralgia, Sjogren's syndome, HTN, macular degeneration, presented with acute resp failure w/ hypoxia, HTN, Ostructive sleep apnea, AKI.  Clinical Impression   Patient is s/p above surgery resulting in functional limitations due to the deficits listed below (see PT Problem List). Lives at home with husband and daughter; modified independent at baseline, uses RW for in-home amb; Presents to PT with generalized weakness; I anticipate good progress with mobility;  Patient will benefit from skilled PT to increase their independence and safety with mobility to allow discharge to the venue listed below.          Recommendations for follow up therapy are one component of a multi-disciplinary discharge planning process, led by the attending physician.  Recommendations may be updated based on patient status, additional functional criteria and insurance authorization.  Follow Up Recommendations Home health PT    Assistance Recommended at Discharge Intermittent Supervision/Assistance  Functional Status Assessment Patient has had a recent decline in their functional status and demonstrates the ability to make significant improvements in function in a reasonable and predictable amount of time.  Equipment Recommendations  None recommended by PT (pretty well-equipped)    Recommendations for Other Services       Precautions / Restrictions Precautions Precautions: Fall Precaution Comments: RUQ drain Restrictions Weight Bearing Restrictions: No      Mobility  Bed Mobility Overal bed mobility: Needs Assistance Bed Mobility: Supine to Sit     Supine to sit: Min  guard;HOB elevated Sit to supine: Min assist   General bed mobility comments: Good roll to semi-sidelying on the R, and push self up to sitting    Transfers Overall transfer level: Needs assistance Equipment used: Rolling walker (2 wheels) Transfers: Sit to/from Stand Sit to Stand: Min guard           General transfer comment: No overt difficulty    Ambulation/Gait Ambulation/Gait assistance: Min guard (without physical contact) Gait Distance (Feet): 30 Feet (in and around room) Assistive device: Rolling walker (2 wheels) Gait Pattern/deviations: Step-through pattern;Decreased step length - right;Decreased step length - left;Decreased stride length       General Gait Details: Overall moving well  Stairs            Wheelchair Mobility    Modified Rankin (Stroke Patients Only)       Balance Overall balance assessment: Needs assistance Sitting-balance support: No upper extremity supported;Feet supported Sitting balance-Leahy Scale: Good     Standing balance support: During functional activity;Bilateral upper extremity supported Standing balance-Leahy Scale: Fair                               Pertinent Vitals/Pain Pain Assessment: Faces Pain Score: 9  Faces Pain Scale: Hurts little more Pain Location: abdomen Pain Descriptors / Indicators: Aching;Discomfort;Grimacing;Guarding Pain Intervention(s): Monitored during session    Home Living Family/patient expects to be discharged to:: Private residence Living Arrangements: Spouse/significant other Available Help at Discharge: Family;Available 24 hours/day Type of Home: House Home Access: Stairs to enter   [employer] of Steps: 1   Home Layout: One level Home Equipment: Agricultural consultant (2 wheels);Shower seat;Wheelchair - power (Reports that her husband is Engineer, maintenance (IT)  bars on shower and by toilet.)      Prior Function Prior Level of Function : Independent/Modified  Independent             Mobility Comments: Reports using RW at home and electric w/c in community       Hand Dominance   Dominant Hand: Right    Extremity/Trunk Assessment   Upper Extremity Assessment Upper Extremity Assessment: Defer to OT evaluation    Lower Extremity Assessment Lower Extremity Assessment: Generalized weakness       Communication   Communication: No difficulties;HOH  Cognition Arousal/Alertness: Awake/alert Behavior During Therapy: WFL for tasks assessed/performed Overall Cognitive Status: Within Functional Limits for tasks assessed                                          General Comments      Exercises     Assessment/Plan    PT Assessment Patient needs continued PT services  PT Problem List Decreased strength;Decreased activity tolerance;Decreased balance;Decreased mobility;Decreased knowledge of use of DME;Decreased knowledge of precautions;Pain       PT Treatment Interventions DME instruction;Gait training;Stair training;Functional mobility training;Therapeutic activities;Therapeutic exercise;Balance training;Patient/family education    PT Goals (Current goals can be found in the Care Plan section)  Acute Rehab PT Goals Patient Stated Goal: Hopes to get home soon PT Goal Formulation: With patient Time For Goal Achievement: 02/09/21 Potential to Achieve Goals: Good    Frequency Min 3X/week   Barriers to discharge   Will be home alone at times    Co-evaluation               AM-PAC PT "6 Clicks" Mobility  Outcome Measure Help needed turning from your back to your side while in a flat bed without using bedrails?: None Help needed moving from lying on your back to sitting on the side of a flat bed without using bedrails?: None Help needed moving to and from a bed to a chair (including a wheelchair)?: A Little Help needed standing up from a chair using your arms (e.g., wheelchair or bedside chair)?: A  Little Help needed to walk in hospital room?: A Little Help needed climbing 3-5 steps with a railing? : A Lot 6 Click Score: 19    End of Session   Activity Tolerance: Patient tolerated treatment well Patient left: with call bell/phone within reach;Other (comment) (attempting to move her bowels in teh bathroom) Nurse Communication: Mobility status;Other (comment) (and pt in bathroom) PT Visit Diagnosis: Unsteadiness on feet (R26.81)    Time: 7096-2836 PT Time Calculation (min) (ACUTE ONLY): 17 min   Charges:   PT Evaluation $PT Eval Low Complexity: 1 Low          Van Clines, PT  Acute Rehabilitation Services Pager [phone] Office [phone]   Levi Aland 01/26/2021, 2:57 PM

## 2021-01-26 NOTE — Evaluation (Signed)
Occupational Therapy Evaluation Patient Details Name: Nancy [name] MRN: [id number] DOB: Jul 13, 1955 Today's Date: 01/26/2021   History of Present Illness 65 y/o female admitted with abdominal pain. Found to have cholecystitis with cholelithiasis. S/p cholecystectomy 01/23/21. Pt w/ hx of obesity, chronic low back pain, BLE edema, polyarthralgia, Sjogren's syndome, HTN, macular degeneration, presented with acute resp failure w/ hypoxia, HTN, Ostructive sleep apnea, AKI.   Clinical Impression   Pt presents with decreased balance and activity tolerance and abdominal pain. Pt currently requiring Min-Mod A for LB ADLs, supervision-setup for other ADLs, and Min guard for functional transfers/mobility. Suspect that pt's performance with LB ADLs will improve once soreness decreases. Pt is primarily modified independent at baseline, lives with husband, and will have assistance from husband and her daughter as needed at home. Pt should be safe to return home without any further skilled OT services once medically cleared. Will follow acutely to maximize safety/independence with ADLs and functional transfers/mobility prior to return home.      Recommendations for follow up therapy are one component of a multi-disciplinary discharge planning process, led by the attending physician.  Recommendations may be updated based on patient status, additional functional criteria and insurance authorization.   Follow Up Recommendations  No OT follow up    Assistance Recommended at Discharge Intermittent Supervision/Assistance  Functional Status Assessment  Patient has had a recent decline in their functional status and demonstrates the ability to make significant improvements in function in a reasonable and predictable amount of time.  Equipment Recommendations  None recommended by OT    Recommendations for Other Services PT consult     Precautions / Restrictions Precautions Precautions: Fall Precaution  Comments: drain Restrictions Weight Bearing Restrictions: No      Mobility Bed Mobility Overal bed mobility: Needs Assistance Bed Mobility: Supine to Sit;Sit to Supine     Supine to sit: Min guard;HOB elevated Sit to supine: Min assist   General bed mobility comments: Assist to lift LEs into bed    Transfers Overall transfer level: Needs assistance Equipment used: Rolling walker (2 wheels) Transfers: Sit to/from Stand Sit to Stand: Min guard                  Balance Overall balance assessment: Needs assistance Sitting-balance support: No upper extremity supported;Feet supported Sitting balance-Leahy Scale: Good     Standing balance support: During functional activity;Bilateral upper extremity supported Standing balance-Leahy Scale: Fair                             ADL either performed or assessed with clinical judgement   ADL Overall ADL's : Needs assistance/impaired Eating/Feeding: Independent   Grooming: Supervision/safety;Standing   Upper Body Bathing: Supervision/ safety;Sitting   Lower Body Bathing: Minimal assistance;Sit to/from stand   Upper Body Dressing : Supervision/safety;Sitting   Lower Body Dressing: Moderate assistance;Sit to/from stand   Toilet Transfer: Min guard;Ambulation;Rolling walker (2 wheels);Grab bars   Toileting- Clothing Manipulation and Hygiene: Independent;Sitting/lateral lean       Functional mobility during ADLs: Min guard;Rolling walker (2 wheels)       Vision   Vision Assessment?: No apparent visual deficits     Perception     Praxis      Pertinent Vitals/Pain Pain Assessment: 0-10 Pain Score: 9  Pain Location: abdomen Pain Descriptors / Indicators: Aching;Discomfort;Grimacing;Guarding Pain Intervention(s): Monitored during session;Repositioned     Hand Dominance Right   Extremity/Trunk Assessment Upper Extremity Assessment Upper  Extremity Assessment: Overall WFL for tasks assessed    Lower Extremity Assessment Lower Extremity Assessment: Defer to PT evaluation       Communication Communication Communication: No difficulties;HOH   Cognition Arousal/Alertness: Awake/alert Behavior During Therapy: WFL for tasks assessed/performed Overall Cognitive Status: Within Functional Limits for tasks assessed                                       General Comments       Exercises     Shoulder Instructions      Home Living Family/patient expects to be discharged to:: Private residence Living Arrangements: Spouse/significant other Available Help at Discharge: Family;Available 24 hours/day Type of Home: House Home Access: Stairs to enter [employer] of Steps: 1   Home Layout: One level     Bathroom Shower/Tub: Chief Strategy Officer: Handicapped height     Home Equipment: Agricultural consultant (2 wheels);Shower seat;Wheelchair - power (Reports that her husband is installing grab bars on shower and by toilet.)          Prior Functioning/Environment Prior Level of Function : Independent/Modified Independent             Mobility Comments: Reports using RW at home and electric w/c in community          OT Problem List: Impaired balance (sitting and/or standing);Decreased knowledge of use of DME or AE;Decreased knowledge of precautions;Pain;Decreased safety awareness      OT Treatment/Interventions: Self-care/ADL training;Therapeutic exercise;Neuromuscular education;Energy conservation;DME and/or AE instruction;Therapeutic activities;Patient/family education;Balance training    OT Goals(Current goals can be found in the care plan section) Acute Rehab OT Goals Patient Stated Goal: return home OT Goal Formulation: With patient/family Time For Goal Achievement: 02/09/21 Potential to Achieve Goals: Good  OT Frequency: Min 2X/week   Barriers to D/C:            Co-evaluation              AM-PAC OT "6 Clicks"  Daily Activity     Outcome Measure Help from another person eating meals?: None Help from another person taking care of personal grooming?: A Little Help from another person toileting, which includes using toliet, bedpan, or urinal?: A Little Help from another person bathing (including washing, rinsing, drying)?: A Little Help from another person to put on and taking off regular upper body clothing?: A Little Help from another person to put on and taking off regular lower body clothing?: A Lot 6 Click Score: 18   End of Session Equipment Utilized During Treatment: Rolling walker (2 wheels) Nurse Communication: Mobility status  Activity Tolerance: Patient tolerated treatment well;Patient limited by pain Patient left: in bed;with call bell/phone within reach;with bed alarm set;with family/visitor present  OT Visit Diagnosis: Unsteadiness on feet (R26.81);Pain                Time: 2440-1027 OT Time Calculation (min): 21 min Charges:  OT General Charges $OT Visit: 1 Visit OT Evaluation $OT Eval Low Complexity: 1 Low  Stephone Gum C, OT/L  Acute Rehab [phone]  Lenice Llamas 01/26/2021, 1:45 PM

## 2021-01-27 ENCOUNTER — Other Ambulatory Visit (HOSPITAL_COMMUNITY): Payer: Self-pay

## 2021-01-27 DIAGNOSIS — Z4803 Encounter for change or removal of drains: Secondary | ICD-10-CM | POA: Diagnosis not present

## 2021-01-27 DIAGNOSIS — N179 Acute kidney failure, unspecified: Secondary | ICD-10-CM | POA: Diagnosis not present

## 2021-01-27 DIAGNOSIS — I10 Essential (primary) hypertension: Secondary | ICD-10-CM | POA: Diagnosis not present

## 2021-01-27 DIAGNOSIS — Z48815 Encounter for surgical aftercare following surgery on the digestive system: Secondary | ICD-10-CM | POA: Diagnosis not present

## 2021-01-27 LAB — CBC
HCT: 27.7 % — ABNORMAL LOW (ref 36.0–46.0)
Hemoglobin: 8.5 g/dL — ABNORMAL LOW (ref 12.0–15.0)
MCH: 23.5 pg — ABNORMAL LOW (ref 26.0–34.0)
MCHC: 30.7 g/dL (ref 30.0–36.0)
MCV: 76.5 fL — ABNORMAL LOW (ref 80.0–100.0)
Platelets: 340 10*3/uL (ref 150–400)
RBC: 3.62 MIL/uL — ABNORMAL LOW (ref 3.87–5.11)
RDW: 14.9 % (ref 11.5–15.5)
WBC: 7.6 10*3/uL (ref 4.0–10.5)
nRBC: 0 % (ref 0.0–0.2)

## 2021-01-27 LAB — BASIC METABOLIC PANEL WITH GFR
Anion gap: 6 (ref 5–15)
BUN: 15 mg/dL (ref 8–23)
CO2: 26 mmol/L (ref 22–32)
Calcium: 8.1 mg/dL — ABNORMAL LOW (ref 8.9–10.3)
Chloride: 105 mmol/L (ref 98–111)
Creatinine, Ser: 1.04 mg/dL — ABNORMAL HIGH (ref 0.44–1.00)
GFR, Estimated: 59 mL/min — ABNORMAL LOW
Glucose, Bld: 103 mg/dL — ABNORMAL HIGH (ref 70–99)
Potassium: 3.7 mmol/L (ref 3.5–5.1)
Sodium: 137 mmol/L (ref 135–145)

## 2021-01-27 LAB — SURGICAL PATHOLOGY

## 2021-01-27 LAB — SODIUM, URINE, RANDOM: Sodium, Ur: 55 mmol/L

## 2021-01-27 MED ORDER — ENOXAPARIN SODIUM 60 MG/0.6ML IJ SOSY
55.0000 mg | PREFILLED_SYRINGE | INTRAMUSCULAR | Status: DC
  Administered 2021-01-27: 55 mg via SUBCUTANEOUS
  Filled 2021-01-27: qty 0.6

## 2021-01-27 MED ORDER — AMOXICILLIN-POT CLAVULANATE 875-125 MG PO TABS
1.0000 | ORAL_TABLET | Freq: Two times a day (BID) | ORAL | 0 refills | Status: AC
  Filled 2021-01-27: qty 12, 6d supply, fill #0

## 2021-01-27 MED ORDER — METOPROLOL SUCCINATE ER 25 MG PO TB24
50.0000 mg | ORAL_TABLET | Freq: Every day | ORAL | Status: DC
  Administered 2021-01-27: 50 mg via ORAL
  Filled 2021-01-27: qty 2

## 2021-01-27 NOTE — Care Management Important Message (Signed)
Important Message  Patient Details  Name: Nancy [name] MRN: [id number] Date of Birth: October 26, 1955   Medicare Important Message Given:  Yes     Wadie Lessen 01/27/2021, 1:47 PM

## 2021-01-27 NOTE — Progress Notes (Signed)
General Surgery Follow Up Note  Subjective:    Overnight Issues:  NAEO. Tolerating PO. Denies urinary sxs. Having flatus and BMs. Daughter at bedside.  Objective:  Vital signs for last 24 hours: Temp:  [97.8 F (36.6 C)-98.2 F (36.8 C)] 98.1 F (36.7 C) (11/21 0757) Pulse Rate:  [73-85] 74 (11/21 0757) Resp:  [14-16] 14 (11/21 0757) BP: (149-198)/(74-100) 175/100 (11/21 0757) SpO2:  [95 %-100 %] 100 % (11/21 0757)  Hemodynamic parameters for last 24 hours:    Intake/Output from previous day: 11/20 0701 - 11/21 0700 In: 1758.4 [P.O.:480; I.V.:1037.2; IV Piggyback:241.2] Out: 900 [Urine:850; Drains:50]  Intake/Output this shift: No intake/output data recorded.  Vent settings for last 24 hours:    Physical Exam:  Gen: comfortable, no distress Neuro: non-focal exam HEENT: PERRL Neck: supple CV: RRR Pulm: unlabored breathing Abd: soft, NT, incisions c/d/I, JP with serous o/p GU: clear yellow urine Extr: wwp, no edema   Results for orders placed or performed during the hospital encounter of 01/22/21 (from the past 24 hour(s))  CBC     Status: Abnormal   Collection Time: 01/27/21  1:17 AM  Result Value Ref Range   WBC 7.6 4.0 - 10.5 K/uL   RBC 3.62 (L) 3.87 - 5.11 MIL/uL   Hemoglobin 8.5 (L) 12.0 - 15.0 g/dL   HCT 62.1 (L) 30.8 - 65.7 %   MCV 76.5 (L) 80.0 - 100.0 fL   MCH 23.5 (L) 26.0 - 34.0 pg   MCHC 30.7 30.0 - 36.0 g/dL   RDW 84.6 96.2 - 95.2 %   Platelets 340 150 - 400 K/uL   nRBC 0.0 0.0 - 0.2 %  Basic metabolic panel     Status: Abnormal   Collection Time: 01/27/21  1:17 AM  Result Value Ref Range   Sodium 137 135 - 145 mmol/L   Potassium 3.7 3.5 - 5.1 mmol/L   Chloride 105 98 - 111 mmol/L   CO2 26 22 - 32 mmol/L   Glucose, Bld 103 (H) 70 - 99 mg/dL   BUN 15 8 - 23 mg/dL   Creatinine, Ser 8.41 (H) 0.44 - 1.00 mg/dL   Calcium 8.1 (L) 8.9 - 10.3 mg/dL   GFR, Estimated 59 (L) >60 mL/min   Anion gap 6 5 - 15  Sodium, urine, random     Status:  None   Collection Time: 01/27/21  3:32 AM  Result Value Ref Range   Sodium, Ur 55 mmol/L    Assessment & Plan:  Present on Admission:  Cholecystitis, acute with cholelithiasis    LOS: 3 days   Additional comments:I reviewed the patient's new clinical lab test results.   and I reviewed the patients new imaging test results.    Acute calculus cholecystitis POD#4 Laparoscopic subtotal cholecystectomy, drainage of intra-abdominal abscess, drain placement Dr. Janee Morn 11/17 - afebrile, WBC remains WNL.  - Given findings of abscess continue antibiotics for a total of 5 days. Ok to transition to oral antibiotics (Augmentin).  - tolerating soft diet and having bowel function.  - continue drain and monitor output - will have drain removed in office. - encouraged ambulation and incentive spirometry use -  PT recommending HHPT - multimodal pain control - stable for discharge from a general surgery perspective. Outpatient follow up for drain removal and post-op follow up arranged. If going home today, would write for 2 days of Augmentin. If going to stay in the hospital through tomorrow (POD#5) then could stay until IV abx completed  tomorrow.   FEN: SOFT ID: [email or web address]: [email or web address]  Hosie Spangle, PA-C  Trauma & General Surgery Please use [email or web address] to contact on call provider  01/27/2021  *Care during the described time interval was provided by me. I have reviewed this patient's available data, including medical history, events of note, physical examination and test results as part of my evaluation.

## 2021-01-27 NOTE — Plan of Care (Signed)
  Problem: Nutrition: Goal: Adequate nutrition will be maintained Outcome: Progressing   Problem: Pain Managment: Goal: General experience of comfort will improve Outcome: Progressing   

## 2021-01-27 NOTE — Plan of Care (Signed)
  Problem: Education: Goal: Knowledge of General Education information will improve Description: Including pain rating scale, medication(s)/side effects and non-pharmacologic comfort measures Outcome: Adequate for Discharge   

## 2021-01-27 NOTE — Progress Notes (Signed)
Physical Therapy Treatment Patient Details Name: Nancy [name] MRN: [id number] DOB: 1955/05/04 Today's Date: 01/27/2021   History of Present Illness 65 y/o female admitted with abdominal pain. Found to have cholecystitis with cholelithiasis. S/p cholecystectomy 01/23/21. Pt w/ hx of obesity, chronic low back pain, BLE edema, polyarthralgia, Sjogren's syndome, HTN, macular degeneration, presented with acute resp failure w/ hypoxia, HTN, Ostructive sleep apnea, AKI.    PT Comments    Pt admitted with above diagnosis. Pt was able to ambulate with min guard assist with rW with good stability overall.  Pt making good progress.  Having incontinence and needed assist for cleaning.  Pt currently with functional limitations due to balance and endurance deficits. Pt will benefit from skilled PT to increase their independence and safety with mobility to allow discharge to the venue listed below.      Recommendations for follow up therapy are one component of a multi-disciplinary discharge planning process, led by the attending physician.  Recommendations may be updated based on patient status, additional functional criteria and insurance authorization.  Follow Up Recommendations  Home health PT     Assistance Recommended at Discharge Intermittent Supervision/Assistance  Equipment Recommendations  None recommended by PT (pretty well-equipped)    Recommendations for Other Services       Precautions / Restrictions Precautions Precautions: Fall Precaution Comments: RUQ drain Restrictions Weight Bearing Restrictions: No     Mobility  Bed Mobility Overal bed mobility: Needs Assistance         Sit to supine: Min guard   General bed mobility comments: On EOB on arrival.  Min guard to get back into bed.    Transfers Overall transfer level: Needs assistance Equipment used: Rolling walker (2 wheels) Transfers: Sit to/from Stand Sit to Stand: Min guard           General transfer  comment: No overt difficulty    Ambulation/Gait Ambulation/Gait assistance: Min guard (without physical contact) Gait Distance (Feet): 65 Feet Assistive device: Rolling walker (2 wheels) Gait Pattern/deviations: Step-through pattern;Decreased step length - right;Decreased step length - left;Decreased stride length       General Gait Details: Overall moving well.  Used 3N1 as pt was wet on arrival. Cleaned pt and then she walked with PT   Stairs             Wheelchair Mobility    Modified Rankin (Stroke Patients Only)       Balance Overall balance assessment: Needs assistance Sitting-balance support: No upper extremity supported;Feet supported Sitting balance-Leahy Scale: Good     Standing balance support: During functional activity;Bilateral upper extremity supported Standing balance-Leahy Scale: Fair                              Cognition Arousal/Alertness: Awake/alert Behavior During Therapy: WFL for tasks assessed/performed Overall Cognitive Status: Within Functional Limits for tasks assessed                                          Exercises      General Comments        Pertinent Vitals/Pain Pain Assessment: Faces Faces Pain Scale: Hurts little more Pain Location: abdomen Pain Descriptors / Indicators: Aching;Discomfort;Grimacing;Guarding Pain Intervention(s): Limited activity within patient's tolerance;Monitored during session;Repositioned    Home Living  Prior Function            PT Goals (current goals can now be found in the care plan section) Acute Rehab PT Goals Patient Stated Goal: Hopes to get home soon Progress towards PT goals: Progressing toward goals    Frequency    Min 3X/week      PT Plan Current plan remains appropriate    Co-evaluation              AM-PAC PT "6 Clicks" Mobility   Outcome Measure  Help needed turning from your back to your side  while in a flat bed without using bedrails?: None Help needed moving from lying on your back to sitting on the side of a flat bed without using bedrails?: None Help needed moving to and from a bed to a chair (including a wheelchair)?: A Little Help needed standing up from a chair using your arms (e.g., wheelchair or bedside chair)?: A Little Help needed to walk in hospital room?: A Little Help needed climbing 3-5 steps with a railing? : A Lot 6 Click Score: 19    End of Session Equipment Utilized During Treatment: Gait belt Activity Tolerance: Patient tolerated treatment well Patient left: with call bell/phone within reach;in chair;with chair alarm set Nurse Communication: Mobility status PT Visit Diagnosis: Unsteadiness on feet (R26.81)     Time: 7829-5621 PT Time Calculation (min) (ACUTE ONLY): 23 min  Charges:  $Gait Training: 8-22 mins $Therapeutic Activity: 8-22 mins                     Adan Baehr M,PT Acute Rehab Services [phone] (pager)    Nancy [name] 01/27/2021, 2:23 PM

## 2021-01-27 NOTE — Progress Notes (Signed)
   HD#4 SUBJECTIVE:  Patient Summary: Nancy [name] is a 65 y.o. with a pertinent PMH of HTN, hypothyroidism, OSA, and obesity who presented with RUQ abd pain and admitted for acute calculous, perforated cholecystitis and is now s/p subtotal cholecystectomy.   Overnight Events: No acute events overnight  Interim History: This is hospital day 4 for Nancy [name] who was seen and evaluated at the bedside this morning. She reports a little abdominal pain, but overall feels improved. She appears brighter and more energetic than previous days. She reports several BM overnight.   OBJECTIVE:  Vital Signs: Vitals:   01/26/21 2028 01/26/21 2340 01/27/21 0027 01/27/21 0508  BP: (!) 198/88  (!) 149/83 (!) 157/79  Pulse: 79 76 73 76  Resp:  16    Temp: 98 F (36.7 C)  97.9 F (36.6 C) 97.8 F (36.6 C)  TempSrc: Oral  Oral Oral  SpO2: 96% 95% 98% 98%  Weight:      Height:       Supplemental O2: Room Air SpO2: 98 %  Filed Weights   01/23/21 1518  Weight: 110.4 kg     Intake/Output Summary (Last 24 hours) at 01/27/2021 0626 Last data filed at 01/27/2021 0557 Gross per 24 hour  Intake 1758.44 ml  Output 880 ml  Net 878.44 ml   Net IO Since Admission: 4,719.34 mL [01/27/21 0626]  Physical Exam: General: No acute distress. Head: Normocephalic. Atraumatic. CV: RRR. No murmurs, rubs, or gallops. Trace LE edema Pulmonary: Lungs CTAB. Normal effort. No wheezing or rales. Abdominal: Soft, nontender, nondistended. Normal bowel sounds. RUQ drain in place with minimal serosanguinous output, dressing is c/d/i Extremities: Palpable radial and DP pulses. Normal ROM. Skin: Warm and dry. No obvious rash or lesions. Neuro: A&Ox3. No focal deficit. Psych: Normal mood and affect     ASSESSMENT/PLAN:  Assessment: Principal Problem:   Cholecystitis, acute with cholelithiasis Active Problems:   RUQ abdominal pain   Plan: #Acute calculous perforated cholecystitis s/p subtotal  cholecystectomy Patient is post op day 4 from laparoscopic subtotal cholecystectomy with drainage of intra-abdominal abscess and RUQ drain placement. Patient remains afebrile and WBC remains within normal limits. Surgical pathology still pending. - IV zoysn x 5 days (day 4/5)  - Soft diet, advance as tolerated - Continue senna and miralax - Zofran and compazine for nausea prn  - Home health PT on discharge - Likely discharge tomorrow   #AKI Baseline Cr around 1. Cr improved from 1.6 yesterday, to 1 today. Renal ultrasound performed and showed no evidence of mass or hydronephrosis, although limited exam as bladder could not be visualized.  - Continue to monitor BMP - Avoid nephrotoxic agents  #Hypothyroidism Continue home synthroid 50 mcg daily.   #OSA CPAP qhs.  Best Practice: Diet: Soft diet IVF: Fluids: none VTE: Lovenox Code: Full AB: Zosyn Therapy Recs: Pending DISPO: Anticipated discharge tomorrow to Home pending Medical stability.  Signature: Elza Rafter, D.O.  Internal Medicine Resident, PGY-1 [facility]  Pager: [phone] 6:26 AM, 01/27/2021   Please contact the on call pager after 5 pm and on weekends at [phone].

## 2021-01-27 NOTE — TOC Initial Note (Addendum)
Transition of Care [facility]) - Initial/Assessment Note    Patient Details  Name: Nancy [name] MRN: [id number] Date of Birth: 1955-11-28  [facility] CM/SW Contact:    Kingsley Plan, RN Phone Number: 01/27/2021, 10:33 AM  Clinical Narrative:                 Spoke to patient and daughter Danford Bad at bedside with patient's spouse on speaker phone.    Patient from home with daughter and spouse. Discussed PT recommendation for HHPT. Patient in agreement. Patient already has DME at home.   Stacie with [facility] checking to see if she can accept [facility] referral. Stacie with [facility] unable to accept.  NCM left Marylene Land with [facility] a message . Marylene Land with [facility] accepted referral.   Bedside nurse to do teaching with patient and family on drain care.   Expected Discharge Plan: Home w Home Health Services Barriers to Discharge: Continued Medical Work up   Patient Goals and CMS Choice Patient states their goals for this hospitalization and ongoing recovery are:: to return to home CMS Medicare.gov Compare Post Acute Care list provided to:: Patient Choice offered to / list presented to : Patient  Expected Discharge Plan and Services Expected Discharge Plan: Home w Home Health Services   Discharge Planning Services: CM Consult Post Acute Care Choice: Home Health Living arrangements for the past 2 months: Single Family Home                 DME Arranged: N/A DME Agency: NA       HH Arranged: PT HH Agency: [facility] Date HH Agency Contacted: 01/27/21 Time HH Agency Contacted: 1033 Representative spoke with at [facility] Agency: Stacie  Prior Living Arrangements/Services Living arrangements for the past 2 months: Single Family Home Lives with:: Adult Children, Spouse Patient language and need for interpreter reviewed:: Yes Do you feel safe going back to the place where you live?: Yes      Need for Family Participation in Patient Care: Yes (Comment) Care  giver support system in place?: Yes (comment) Current home services: DME Criminal Activity/Legal Involvement Pertinent to Current Situation/Hospitalization: No - Comment as needed  Activities of Daily Living      Permission Sought/Granted   Permission granted to share information with : Yes, Verbal Permission Granted  Share Information with NAME: daughter Danford Bad and husband Reita Cliche           Emotional Assessment Appearance:: Appears stated age Attitude/Demeanor/Rapport: Engaged Affect (typically observed): Accepting Orientation: : Oriented to Situation, Oriented to  Time, Oriented to Place, Oriented to Self Alcohol / Substance Use: Not Applicable Psych Involvement: No (comment)  Admission diagnosis:  Cholecystitis, acute with cholelithiasis [K80.00] Patient Active Problem List   Diagnosis Date Noted   RUQ abdominal pain    Cholecystitis, acute with cholelithiasis 01/22/2021   Acute cholecystitis 01/21/2021   Nocturia 12/16/2020   Encounter for screening mammogram for malignant neoplasm of breast 12/16/2020   Plantar fasciitis of left foot 12/16/2020   Asymmetrical sensorineural hearing loss 08/09/2019   Wheelchair dependence 12/17/2016   Polyarthralgia 12/02/2016   Healthcare maintenance 12/18/2014   GERD (gastroesophageal reflux disease) 04/13/2014   Allergic rhinitis 01/24/2014   Morbid obesity with body mass index (BMI) of 40.0 to 44.9 in adult (HCC) 09/14/2013   Abnormal CT of brain 11/19/2011   MACULAR DEGENERATION, BILATERAL 05/22/2008   Essential hypertension 03/17/2006   SICCA SYNDROME 03/17/2006   KNEE PAIN, CHRONIC 03/17/2006   Chronic low back  pain 03/17/2006   Obstructive sleep apnea 09/07/2003   Hypothyroidism 09/07/1998   ANEMIA, IRON DEFICIENCY NOS 09/07/1998   PCP:  Dickie La, MD Pharmacy:   [facility] [address] ([place]), [place] - [address] [place] ([place]) [place] [address] Phone: [phone] Fax:  [phone]     Social Determinants of Health (SDOH) Interventions    Readmission Risk Interventions No flowsheet data found.

## 2021-01-27 NOTE — Discharge Summary (Addendum)
Name: Nancy [name] MRN: [id number] DOB: 16-Feb-1956 65 y.o. PCP: Charise Killian, MD  Date of Admission: 01/22/2021  4:37 PM Date of Discharge:  01/27/2021 Attending Physician: Dr. Philipp Ovens  DISCHARGE DIAGNOSIS:  Primary Problem: Cholecystitis, acute with cholelithiasis   Hospital Problems: Principal Problem:   Cholecystitis, acute with cholelithiasis Active Problems:   RUQ abdominal pain    DISCHARGE MEDICATIONS:   Allergies as of 01/27/2021       Reactions   Amlodipine    Lower extremity swelling.  This reaction only occurred when taking GENERIC amlodipine.  She previously tolerated brand name Norvasc well.   Coconut Fatty Acids    Break out   Naproxen Sodium Nausea And Vomiting   Says she can take ibuprofen        Medication List     TAKE these medications    acetaminophen 500 MG tablet Commonly known as: TYLENOL Take 2 tablets (1,000 mg total) by mouth every 8 (eight) hours as needed for moderate pain.   albuterol 108 (90 Base) MCG/ACT inhaler Commonly known as: VENTOLIN HFA Inhale 1-2 puffs every 6 (six) hours as needed into the lungs for wheezing or shortness of breath.   amoxicillin-clavulanate 875-125 MG tablet Commonly known as: Augmentin Take 1 tablet by mouth 2 (two) times daily for 6 days. Start taking on: January 28, 2021   APPLE CIDER VINEGAR PO Take 1 tablet by mouth daily. Gummy Goli   cholecalciferol 25 MCG (1000 UNIT) tablet Commonly known as: VITAMIN D3 Take 1,000 Units by mouth daily.   diclofenac Sodium 1 % Gel Commonly known as: VOLTAREN Apply 4 g topically every 6 (six) hours as needed (arthritis pain).   HYDROcodone-acetaminophen 7.5-325 MG tablet Commonly known as: NORCO Take 1 tablet by mouth every 8 (eight) hours as needed for moderate pain.   levothyroxine 50 MCG tablet Commonly known as: SYNTHROID Take 50 mcg by mouth daily.   metoprolol succinate 50 MG 24 hr tablet Commonly known as: TOPROL-XL Take 50 mg by mouth  daily. Take with or immediately following a meal.        DISPOSITION AND FOLLOW-UP:  Nancy [name] was discharged from [facility] in Stable condition. At the hospital follow up visit please address:  Follow-up Recommendations: Consults: General surgery Labs: Basic Metabolic Profile (follow up Cr- had AKI during hospitalization); please follow up surgical pathology from 11/17 Studies: None Medications: Augmentin x 6 days (complete 10 day course of abx)  Follow-up Appointments:  Follow-up Information     [facility]. Go on 01/29/2021.   Why: at 3:00 PM for drain check and possible removal. please arrive 30 minutes early. Contact information: [address] [place] [address]        Carlena Hurl, PA-C. Go on 02/11/2021.   Specialty: General Surgery Why: at 11:00 AM for post-operative follow up. please arrive 15 minutes early. Contact information: [address] [place] [address] [phone]         Charise Killian, MD. Go to.   Specialty: Internal Medicine Why: The clinic will call you with a hospital follow up appointment Contact information: [place] [address] [place], [place] Follow up.   Specialty: Home Health Services Contact information: [place] [address] [phone]  HOSPITAL COURSE:  Patient Summary: #Acute calculous perforated cholecystitis #Cholelithiasis Patient was sent in as a direct admission from the clinic for RUQ abdominal pain and found to have numerous gallstones on ultrasound, with no gallbladder wall thickening or ductal dilation. CT abd/pelvis noted acute, perforated cholecystitis with progressive pericholescystic inflammatory changes since prior ultrasound on 11/15. CMP with normal AST/ALT, alk phos, and T bili. Patient had laparoscopic  subtotal cholecystectomy with RUQ drain placement on 11/17, and was found to have severe acute cholecystitis with empyema of the gallbladder and perforation contained by omentum. Also had severe inflammation of the transverse colon and duodenum adherent to the gallbladder. Started on IV zosyn prior to surgery, and was continued on IV zosyn for 4 days post-op. Pain improved throughout admission and drain had minimal serosanguinous fluid output. RUQ drain to stay in place until follow up with general surgery. Will be discharged with 6 days of Augmentin, to complete 10 day post-op course of antibiotics. PT recommending home health PT, which was set up prior to discharge.  #AKI Baseline Cr around 1. Cr improved from 1.6 on 11/20 to 1 on the day of discharge after receiving IV fluids. Renal ultrasound performed and showed no evidence of mass or hydronephrosis, although limited exam as bladder could not be visualized.   #Hypertension Blood pressure remained elevated throughout hospitalization. Patient was continued on her home metoprolol 50 mg daily, and likely will need further titration of this for better blood pressure control vs a new medication.  #Hypothyroidism Continued on home synthroid 50 mcg daily.   #OSA CPAP qhs.   DISCHARGE INSTRUCTIONS:  FOLLOW-UP INSTRUCTIONS:  Thank you for allowing [place] to be part of your care. You were hospitalized for Cholecystitis, acute with cholelithiasis   Please follow up with the following providers: Raford Pitcher, MD, [address] / [place] [address], [phone] - the internal medicine clinic will call you with a hospital follow up appointment  B. [facility]: appt on 11/23 at 3pm for drain check and possible removal  [address]   [facility] appt on 12/6 at 11am for post-operative follow up  Please note these changes made to your medications:   A. Medications to continue: Current Meds   Medication Sig   acetaminophen (TYLENOL) 500 MG tablet Take 2 tablets (1,000 mg total) by mouth every 8 (eight) hours as needed for moderate pain.   albuterol (PROVENTIL HFA;VENTOLIN HFA) 108 (90 Base) MCG/ACT inhaler Inhale 1-2 puffs every 6 (six) hours as needed into the lungs for wheezing or shortness of breath.       APPLE CIDER VINEGAR PO Take 1 tablet by mouth daily. Gummy Goli   cholecalciferol (VITAMIN D3) 25 MCG (1000 UNIT) tablet Take 1,000 Units by mouth daily.   diclofenac Sodium (VOLTAREN) 1 % GEL Apply 4 g topically every 6 (six) hours as needed (arthritis pain).   HYDROcodone-acetaminophen (NORCO) 7.5-325 MG tablet Take 1 tablet by mouth every 8 (eight) hours as needed for moderate pain.   metoprolol succinate (TOPROL-XL) 50 MG 24 hr tablet Take 50 mg by mouth daily. Take with or immediately following a meal.      B. Medications to start:    amoxicillin-clavulanate (AUGMENTIN) 875-125 MG tablet twice a day for 6 days  C. Medications to discontinue:  None   Please make sure to follow up with the internal medicine clinic for a hospital follow up, as well as for follow up of your  blood pressure. Please also follow up with the general surgeons to have your drain removed this week.   Please call our clinic if you have any questions or concerns, we may be able to help and keep you from a long and expensive emergency room wait. Our clinic and after hours phone number is [phone], the best time to call is Monday through Friday 9 am to 4 pm but there is always someone available 24/7 if you have an emergency. If you need medication refills please notify your pharmacy one week in advance and they will send [place] a request.  If you have any questions or concerns after hours call [phone] and ask for the internal medicine resident on call.     SUBJECTIVE:  Nancy [name] was seen and evaluated on the day of discharge. She states that her abdominal pain is improved and she has had  several bowel movements. Patient worked with PT today and she appears more energetic than she has the previous days. Patient is ready to be discharged to home.  Discharge Vitals:   BP (!) 175/100 (BP Location: Right Arm)   Pulse 74   Temp 98.1 F (36.7 C) (Oral)   Resp 14   Ht _0  (1.6 m)   Wt 110.4 kg   SpO2 100%   BMI 43.10 kg/m   OBJECTIVE:  General: No acute distress. Head: Normocephalic. Atraumatic. CV: RRR. No murmurs, rubs, or gallops. Trace LE edema Pulmonary: Lungs CTAB. Normal effort. No wheezing or rales. Abdominal: Soft, nontender, nondistended. Normal bowel sounds. RUQ drain in place with minimal serosanguinous output, dressing is c/d/i Extremities: Palpable radial and DP pulses. Normal ROM. Skin: Warm and dry. No obvious rash or lesions. Neuro: A&Ox3. No focal deficit. Psych: Normal mood and affect  Pertinent Labs, Studies, and Procedures:  CBC Latest Ref Rng & Units 01/27/2021 01/26/2021 01/25/2021  WBC 4.0 - 10.5 K/uL 7.6 8.7 9.0  Hemoglobin 12.0 - 15.0 g/dL 8.5(L) 8.5(L) 9.1(L)  Hematocrit 36.0 - 46.0 % 27.7(L) 27.7(L) 31.0(L)  Platelets 150 - 400 K/uL 340 326 375    CMP Latest Ref Rng & Units 01/27/2021 01/26/2021 01/25/2021  Glucose 70 - 99 mg/dL 103(H) 106(H) 120(H)  BUN 8 - 23 mg/dL _1 Creatinine 0.44 - 1.00 mg/dL 1.04(H) 1.66(H) 1.64(H)  Sodium 135 - 145 mmol/L 137 136 134(L)  Potassium 3.5 - 5.1 mmol/L 3.7 4.2 4.0  Chloride 98 - 111 mmol/L 105 100 101  CO2 22 - 32 mmol/L _2 Calcium 8.9 - 10.3 mg/dL 8.1(L) 8.3(L) 8.3(L)  Total Protein 6.5 - 8.1 g/dL - - -  Total Bilirubin 0.3 - 1.2 mg/dL - - -  Alkaline Phos 38 - 126 U/L - - -  AST 15 - 41 U/L - - -  ALT 0 - 44 U/L - - -    CT ABDOMEN PELVIS W CONTRAST  Result Date: 01/23/2021 CLINICAL DATA:  Cholecystitis EXAM: CT ABDOMEN AND PELVIS WITH CONTRAST TECHNIQUE: Multidetector CT imaging of the abdomen and pelvis was performed using the standard protocol following bolus  administration of intravenous contrast. CONTRAST:  153m OMNIPAQUE IOHEXOL 350 MG/ML SOLN COMPARISON:  04/11/2014 FINDINGS: Lower chest: Mild bibasilar atelectasis. Cardiac size is within normal limits. No pericardial effusion. Hepatobiliary: Since the prior examination, the gallbladder has become increasingly thick walled and demonstrates progressive pericolonic inflammatory stranding. There is increasing pericholecystic fluid within the gallbladder fossa. Additionally, there is a tiny excrescence of fluid emanating from a defect within  the gallbladder wall extending into the gallbladder fossa, best seen on image # 35/7 and axial image # 31/3, in keeping with perforated cholecystitis. The liver is unremarkable. No intra or extrahepatic biliary ductal dilation. Pancreas: Unremarkable Spleen: Unremarkable Adrenals/Urinary Tract: Adrenal glands are unremarkable. Kidneys are normal, without renal calculi, focal lesion, or hydronephrosis. Bladder is unremarkable. Stomach/Bowel: Mild sigmoid diverticulosis. The stomach, small bowel, and large bowel are otherwise unremarkable. Appendix normal. No free intraperitoneal gas or fluid. Vascular/Lymphatic: Aortic atherosclerosis. No enlarged abdominal or pelvic lymph nodes. Reproductive: Uterus and bilateral adnexa are unremarkable. Other: No abdominal wall hernia. Musculoskeletal: No acute bone abnormality. L4-5 anterior lumbar fusion and right hemilaminectomy has been performed. No lytic or blastic bone lesions IMPRESSION: Acute, perforated cholecystitis with progressive pericholecystic inflammatory change since prior examination of 01/21/2021. Surgical consultation is advised. Mild sigmoid diverticulosis. Aortic Atherosclerosis (ICD10-I70.0). Electronically Signed   By: Fidela Salisbury M.D.   On: 01/23/2021 00:23     Signed: Buddy Duty, D.O.  Internal Medicine Resident, PGY-1 [facility]  Pager: [phone] 1:43 PM, 01/27/2021

## 2021-01-27 NOTE — Progress Notes (Signed)
Discharge instructions (including medications) discussed with and copy provided to patient/caregiver 

## 2021-01-27 NOTE — Discharge Instructions (Signed)
FOLLOW-UP INSTRUCTIONS:  Thank you for allowing [place] to be part of your care. You were hospitalized for Cholecystitis, acute with cholelithiasis   Please follow up with the following providers: Josefine Class, MD, [address] / [place] [address], [phone] - the internal medicine clinic will call you with a hospital follow up appointment  B. [facility]: appt on 11/23 at 3pm for drain check and possible removal  [address]   C[facility] appt on 12/6 at 11am for post-operative follow up  Please note these changes made to your medications:   A. Medications to continue: Current Meds  Medication Sig   acetaminophen (TYLENOL) 500 MG tablet Take 2 tablets (1,000 mg total) by mouth every 8 (eight) hours as needed for moderate pain.   albuterol (PROVENTIL HFA;VENTOLIN HFA) 108 (90 Base) MCG/ACT inhaler Inhale 1-2 puffs every 6 (six) hours as needed into the lungs for wheezing or shortness of breath.       APPLE CIDER VINEGAR PO Take 1 tablet by mouth daily. Gummy Goli   cholecalciferol (VITAMIN D3) 25 MCG (1000 UNIT) tablet Take 1,000 Units by mouth daily.   diclofenac Sodium (VOLTAREN) 1 % GEL Apply 4 g topically every 6 (six) hours as needed (arthritis pain).   HYDROcodone-acetaminophen (NORCO) 7.5-325 MG tablet Take 1 tablet by mouth every 8 (eight) hours as needed for moderate pain.   metoprolol succinate (TOPROL-XL) 50 MG 24 hr tablet Take 50 mg by mouth daily. Take with or immediately following a meal.      B. Medications to start:    amoxicillin-clavulanate (AUGMENTIN) 875-125 MG tablet twice a day for 6 days  C. Medications to discontinue:  None    Please make sure to follow up with the internal medicine clinic for a hospital follow up, as well as for follow up of your blood pressure. Please also follow up with the general surgeons to have your drain removed this week.   Please call our clinic if you have any questions or  concerns, we may be able to help and keep you from a long and expensive emergency room wait. Our clinic and after hours phone number is [phone], the best time to call is Monday through Friday 9 am to 4 pm but there is always someone available 24/7 if you have an emergency. If you need medication refills please notify your pharmacy one week in advance and they will send [place] a request.  If you have any questions or concerns after hours call [phone] and ask for the internal medicine resident on call.

## 2021-01-29 ENCOUNTER — Telehealth: Payer: Self-pay

## 2021-01-29 NOTE — Telephone Encounter (Signed)
MRS johnson from [facility] Is calling to inform Dr that pt is refusing care until Monday 11/28   [phone]

## 2021-02-03 ENCOUNTER — Encounter: Payer: Medicare HMO | Admitting: Student

## 2021-02-05 ENCOUNTER — Other Ambulatory Visit: Payer: Self-pay

## 2021-02-05 ENCOUNTER — Ambulatory Visit (INDEPENDENT_AMBULATORY_CARE_PROVIDER_SITE_OTHER): Payer: Medicare HMO | Admitting: Student

## 2021-02-05 ENCOUNTER — Encounter: Payer: Self-pay | Admitting: Student

## 2021-02-05 ENCOUNTER — Other Ambulatory Visit (HOSPITAL_COMMUNITY): Payer: Self-pay

## 2021-02-05 VITALS — BP 164/83 | HR 70 | Temp 98.1°F | Ht 63.0 in | Wt 246.6 lb

## 2021-02-05 DIAGNOSIS — N179 Acute kidney failure, unspecified: Secondary | ICD-10-CM | POA: Diagnosis not present

## 2021-02-05 DIAGNOSIS — I10 Essential (primary) hypertension: Secondary | ICD-10-CM

## 2021-02-05 DIAGNOSIS — K8000 Calculus of gallbladder with acute cholecystitis without obstruction: Secondary | ICD-10-CM | POA: Diagnosis not present

## 2021-02-05 DIAGNOSIS — S3011XA Contusion of abdominal wall, initial encounter: Secondary | ICD-10-CM

## 2021-02-05 DIAGNOSIS — S301XXA Contusion of abdominal wall, initial encounter: Secondary | ICD-10-CM

## 2021-02-05 DIAGNOSIS — E039 Hypothyroidism, unspecified: Secondary | ICD-10-CM | POA: Diagnosis not present

## 2021-02-05 MED ORDER — LEVOTHYROXINE SODIUM 50 MCG PO TABS: 50.0000 ug | ORAL_TABLET | Freq: Every day | ORAL | 3 refills | Status: DC

## 2021-02-05 MED ORDER — METOPROLOL SUCCINATE ER 50 MG PO TB24: 50.0000 mg | ORAL_TABLET | Freq: Every day | ORAL | 3 refills | Status: DC

## 2021-02-05 NOTE — Patient Instructions (Signed)
Thank you, Ms.Bellatrix S Baumgart for allowing [place] to provide your care today. Today we discussed .  Gall Bladder Removal And glad that you are doing well since her surgery.  Please continue to follow-up with the surgeons.  If you notice that you have any drainage or leakage from any of your abdominal surgical wounds please call surgeon's office.  If you find that you are having to alter your diet due to diarrhea nausea or vomiting, in any way please call our clinic schedule an appointment.  High Blood Pressure We will be checking your kidney function as well as your electrolytes today.  I will do some chart taking to figure out why you were discontinued on 2 blood pressure medicines that I think you need to be on.  Please continue to check your blood pressure at home and record these findings.  If your kidney function looks good and your blood pressure continues to be elevated we will be adding a blood pressure medicine.  Chronic Cough It appears as though your chronic cough is secondary to your history of bronchitis as well as postnasal drip.  Please continue to use Hall's lozenges as they have helped in the past with your chronic cough.  If there is no improvement in the next few weeks please call our clinic for further evaluation.  Altered Kidney Function We will be rechecking kidney function today.  I suspect this is from your decreased amount of fluid you took and prior to your procedure.  I have ordered the following labs for you:   Lab Orders         BMP8+Anion Gap       Referrals ordered today:   Referral Orders  No referral(s) requested today     I have ordered the following medication/changed the following medications:   Stop the following medications: Medications Discontinued During This Encounter  Medication Reason   levothyroxine (SYNTHROID) 50 MCG tablet Reorder   metoprolol succinate (TOPROL-XL) 50 MG 24 hr tablet Reorder     Start the following medications: Meds  ordered this encounter  Medications   levothyroxine (SYNTHROID) 50 MCG tablet    Sig: Take 1 tablet (50 mcg total) by mouth daily.    Dispense:  90 tablet    Refill:  3   metoprolol succinate (TOPROL-XL) 50 MG 24 hr tablet    Sig: Take 1 tablet (50 mg total) by mouth daily. Take with or immediately following a meal.    Dispense:  90 tablet    Refill:  3     Follow up:  1 month follow up with PCP     Should you have any questions or concerns please call the internal medicine clinic at [phone].    Thalia Bloodgood, D.O. [facility]

## 2021-02-06 DIAGNOSIS — S301XXA Contusion of abdominal wall, initial encounter: Secondary | ICD-10-CM | POA: Insufficient documentation

## 2021-02-06 DIAGNOSIS — S3011XA Contusion of abdominal wall, initial encounter: Secondary | ICD-10-CM | POA: Insufficient documentation

## 2021-02-06 LAB — BMP8+ANION GAP
Anion Gap: 11 mmol/L (ref 10.0–18.0)
BUN/Creatinine Ratio: 11 — ABNORMAL LOW (ref 12–28)
BUN: 10 mg/dL (ref 8–27)
CO2: 27 mmol/L (ref 20–29)
Calcium: 9.2 mg/dL (ref 8.7–10.3)
Chloride: 99 mmol/L (ref 96–106)
Creatinine, Ser: 0.92 mg/dL (ref 0.57–1.00)
Glucose: 98 mg/dL (ref 70–99)
Potassium: 4.8 mmol/L (ref 3.5–5.2)
Sodium: 137 mmol/L (ref 134–144)
eGFR: 68 mL/min/{1.73_m2}

## 2021-02-06 NOTE — Assessment & Plan Note (Signed)
Acute kidney injury found during hospitalization.  This was thought to be prerenal in nature.  She has had resolution of her AKI her creatinine is under 1 and at her baseline.

## 2021-02-06 NOTE — Assessment & Plan Note (Signed)
.  Assessment: Continues to be hypertensive current blood pressure regimen of metoprolol succinate 50 mg daily.  In the past it appears that she was on chlorthalidone as well as lisinopril.  Denies any documentation to why these were discontinued.  Patient was admitted to [facility] and appears as though during this time her chlorthalidone was discontinued.  There is no documentation as to why but this was possibly secondary to an AKI she had during hospitalization.  Unknown reason as to why lisinopril stopped.  BMP unremarkable consider restarting lisinopril or even an ARB.  Will defer to primary care provider and sent message to her endorsing multiple visits with elevated blood pressure.  Plan: -Consider addition of ARB and/or restarting lisinopril versus patient's chlorthalidone

## 2021-02-06 NOTE — Assessment & Plan Note (Signed)
Assessment: Patient presents with ecchymosis as well as palpable round mass of left lower quadrant.  Patient states that this is where they were injecting her with Lovenox during her hospitalization.  I suspect that this palpable mass is a hematoma formed.  Patient endorses that they injected Lovenox in the same location throughout her hospitalization.  Discussed with patient that we will continue to monitor this and that should improve over the coming days to weeks.  If no improvement or worsening of the mass she is to call the clinic to be evaluated.  She starts having symptoms of colonic involvement, she is to call the clinic.  Plan: -Continue to monitor closely

## 2021-02-06 NOTE — Progress Notes (Signed)
CC: Follow-up hospitalization-perforated gallbladder status post subtotal cholecystectomy  HPI:  Nancy [name] is a 65 y.o. female with a past medical history stated below and presents today for follow-up after recent hospitalization for perforated gallbladder and status post subtotalcholecystectomy. Please see problem based assessment and plan for additional details.  Past Medical History:  Diagnosis Date   AKI (acute kidney injury) (HCC) 12/04/2018   Back pain    chronic low back pain L4-5 discectomy:11/99. degenerative thoracic spondylotic changes 4/07   Chest pain    neg adenosine myoview.   Hypertension    no LVH EKG-7/05, nl M/C ratio 4/07   Hypothyroidism    09/1998   Lower extremity edema    echo EF 55-65% w/o evidence of Dias dysfx.,    Menorrhagia    Microcytic anemia    09/1998. Hb-10.5/MCV 76. needs ferritin to determine ACD vs IDA   Morbid obesity (HCC)    Obstructive sleep apnea    severe 7/05 RD 161 per hr. /CPAP 18cwp   Pneumonia due to COVID-19 virus 12/04/2018   Polyarthralgia    (knee/back/ankle) w/dx of fibromyalgia? given by [facility] rheum, knee pain chronic 2/2 obesity, fibromyalgia and Sjogren's.   Primary Sjogren's syndrome (HCC)    anti Ro+;ANA>1:1280 in homogen pattern, negative ds DNA/RF/anti Smith/RNP/C3-4 comp/la/jo1/Scleroderma/centromere, neg HIV/ACE/Hep B/C, nl CXR 2/05, schirmer salivary glandtest not done, symptom rx:eye drops/prednisone/plaquenil referral to [facility] rheum, [name] 3/07. saw [name] but stopped 2/2 cost.   Uterine bleeding    Viral URI with cough 11/03/2017    Current Outpatient Medications on File Prior to Visit  Medication Sig Dispense Refill   acetaminophen (TYLENOL) 500 MG tablet Take 2 tablets (1,000 mg total) by mouth every 8 (eight) hours as needed for moderate pain. 180 tablet 0   albuterol (PROVENTIL HFA;VENTOLIN HFA) 108 (90 Base) MCG/ACT inhaler Inhale 1-2 puffs every 6 (six) hours as needed into  the lungs for wheezing or shortness of breath. 1 Inhaler 0   APPLE CIDER VINEGAR PO Take 1 tablet by mouth daily. Gummy Goli     cholecalciferol (VITAMIN D3) 25 MCG (1000 UNIT) tablet Take 1,000 Units by mouth daily.     diclofenac Sodium (VOLTAREN) 1 % GEL Apply 4 g topically every 6 (six) hours as needed (arthritis pain). 150 g 0   HYDROcodone-acetaminophen (NORCO) 7.5-325 MG tablet Take 1 tablet by mouth every 8 (eight) hours as needed for moderate pain.     No current facility-administered medications on file prior to visit.    Family History  Problem Relation Age of Onset   Hypertension Mother    Cancer Mother    Cancer Father    Cancer Sister    Ovarian cancer Other        family history   Breast cancer Other        family history in 1st degree relatives.   Thyroid disease Sister    Colon cancer Neg Hx    Stomach cancer Neg Hx     Social History   Socioeconomic History   Marital status: Married    Spouse name: Not on file   Number of children: Not on file   Years of education: Not on file   Highest education level: Not on file  Occupational History   Occupation: house wife    Employer: UNEMPLOYED  Tobacco Use   Smoking status: Never   Smokeless tobacco: Never  Substance and Sexual Activity   Alcohol use: No    Alcohol/week: 0.0  standard drinks   Drug use: No   Sexual activity: Not on file  Other Topics Concern   Not on file  Social History Narrative   Her daughter [name]) often comes with her to visits. She also has a grand daughter and great grand daughter(whose name is Land)   Social Determinants of Corporate investment banker Strain: Not on file  Food Insecurity: Not on file  Transportation Needs: Not on file  Physical Activity: Not on file  Stress: Not on file  Social Connections: Not on file  Intimate Partner Violence: Not on file    Review of Systems: ROS negative except for what is noted on the assessment and plan.  Vitals:   02/05/21 1353  02/05/21 1501  BP: (!) 181/79 (!) 164/83  Pulse: 79 70  Temp: 98.1 F (36.7 C)   TempSrc: Oral   SpO2: 99%   Weight: 246 lb 9.6 oz (111.9 kg)   Height: 5\' 3"  (1.6 m)      Physical Exam: Constitutional: Well-appearing, no acute distress HENT: normocephalic atraumatic Eyes: conjunctiva non-erythematous Neck: supple Cardiovascular: regular rate and rhythm, no m/r/g Pulmonary/Chest: normal work of breathing on room air, lungs clear to auscultation bilaterally Abdominal: soft, non-tender, non-distended.  Surgical sites clean dry and intact.  Bandage placed over right upper quadrant surgical site.  Ecchymosis and round palpable mass located in left lower quadrant. MSK: normal bulk and tone Neurological: alert & oriented x 3, ambulates with walker Skin: warm and dry Psych: Normal mood and thought process   Assessment & Plan:   See Encounters Tab for problem based charting.  Patient discussed with Dr. , D.O. [facility] Internal Medicine, PGY-2 Pager: [phone], Phone: [phone] Date 02/06/2021 Time 1:15 PM

## 2021-02-06 NOTE — Assessment & Plan Note (Signed)
Assessment: Patient with recent admission after having right upper quadrant pain and imaging found to have perforated gallbladder.  She had a subtotal cholecystectomy which she tolerated without complications.  She had a drain placed that was removed by a surgeon on outpatient basis.  She continues to follow with them closely.  Since her admission she is doing well overall she denies any active drainage from her wound sites and any difficulties with abdominal pain or diet.  She denies any episodes of changes in her bowel.  Overall pain is well controlled.  Wounds are healing well, she will continue to follow-up with general surgery on the outpatient basis.  Plan: -Continue to monitor

## 2021-02-07 ENCOUNTER — Telehealth: Payer: Self-pay | Admitting: Internal Medicine

## 2021-02-07 MED ORDER — LOSARTAN POTASSIUM 25 MG PO TABS: 25.0000 mg | ORAL_TABLET | Freq: Every day | ORAL | 3 refills | Status: DC

## 2021-02-07 NOTE — Telephone Encounter (Signed)
Called to f/u on blood pressure from resident visit 11/30. Elevated pressures noted during recent hospitalization and recent clinic visits. Discussed starting losartan today with home monitoring and f/u in one month for repeat check/BMP. Labs reviewed. Patient was agreeable.

## 2021-03-04 NOTE — Progress Notes (Signed)
Internal Medicine Clinic Attending  Case discussed with Dr. Katsadouros  At the time of the visit.  We reviewed the resident's history and exam and pertinent patient test results.  I agree with the assessment, diagnosis, and plan of care documented in the resident's note.  

## 2021-03-14 ENCOUNTER — Telehealth: Payer: Self-pay | Admitting: Internal Medicine

## 2021-03-14 NOTE — Telephone Encounter (Signed)
Entered in error. Please disregard

## 2021-03-17 ENCOUNTER — Encounter: Payer: Self-pay | Admitting: Internal Medicine

## 2021-03-17 ENCOUNTER — Other Ambulatory Visit: Payer: Self-pay

## 2021-03-17 ENCOUNTER — Ambulatory Visit (INDEPENDENT_AMBULATORY_CARE_PROVIDER_SITE_OTHER): Payer: Medicare PPO | Admitting: Internal Medicine

## 2021-03-17 VITALS — BP 171/79 | HR 74 | Temp 98.7°F | Ht 63.0 in | Wt 248.8 lb

## 2021-03-17 DIAGNOSIS — I10 Essential (primary) hypertension: Secondary | ICD-10-CM

## 2021-03-17 DIAGNOSIS — M3500 Sicca syndrome, unspecified: Secondary | ICD-10-CM | POA: Diagnosis not present

## 2021-03-17 DIAGNOSIS — M2550 Pain in unspecified joint: Secondary | ICD-10-CM | POA: Diagnosis not present

## 2021-03-17 DIAGNOSIS — D509 Iron deficiency anemia, unspecified: Secondary | ICD-10-CM

## 2021-03-17 DIAGNOSIS — Z6841 Body Mass Index (BMI) 40.0 and over, adult: Secondary | ICD-10-CM | POA: Diagnosis not present

## 2021-03-17 DIAGNOSIS — Z13820 Encounter for screening for osteoporosis: Secondary | ICD-10-CM

## 2021-03-17 DIAGNOSIS — S301XXD Contusion of abdominal wall, subsequent encounter: Secondary | ICD-10-CM

## 2021-03-17 DIAGNOSIS — S3011XD Contusion of abdominal wall, subsequent encounter: Secondary | ICD-10-CM

## 2021-03-17 DIAGNOSIS — Z0000 Encounter for general adult medical examination without abnormal findings: Secondary | ICD-10-CM

## 2021-03-17 LAB — CBC
HCT: 34.2 % — ABNORMAL LOW (ref 36.0–46.0)
Hemoglobin: 10.5 g/dL — ABNORMAL LOW (ref 12.0–15.0)
MCH: 23.3 pg — ABNORMAL LOW (ref 26.0–34.0)
MCHC: 30.7 g/dL (ref 30.0–36.0)
MCV: 75.8 fL — ABNORMAL LOW (ref 80.0–100.0)
Platelets: 296 10*3/uL (ref 150–400)
RBC: 4.51 MIL/uL (ref 3.87–5.11)
RDW: 15.9 % — ABNORMAL HIGH (ref 11.5–15.5)
WBC: 5.7 10*3/uL (ref 4.0–10.5)
nRBC: 0 % (ref 0.0–0.2)

## 2021-03-17 MED ORDER — LOSARTAN POTASSIUM 50 MG PO TABS: 50.0000 mg | ORAL_TABLET | Freq: Every day | ORAL | 11 refills | Status: DC

## 2021-03-17 MED ORDER — MELOXICAM 7.5 MG PO TABS: 7.5000 mg | ORAL_TABLET | Freq: Every day | ORAL | 0 refills | Status: DC | PRN

## 2021-03-17 MED ORDER — MELOXICAM 7.5 MG PO TABS: 7.5000 mg | ORAL_TABLET | Freq: Every day | ORAL | 0 refills | Status: DC

## 2021-03-17 MED ORDER — TRAMADOL HCL 50 MG PO TABS: ORAL_TABLET | ORAL | 0 refills | Status: DC

## 2021-03-17 NOTE — Assessment & Plan Note (Signed)
>>  ASSESSMENT AND PLAN FOR POLYARTHRALGIA WRITTEN ON 03/17/2021  1:45 PM BY LAU, GRACE, MD  History of polyarthralgia per chart review. Possible diagnosis of fibromyalgia given by prior rheumatology evaluation although not available per chart. Previously on acetaminophen/codeine with our clinic, however was switched to hydrocodone/acetaminophen per outside provider prior to returning to [facility]. Bilateral knee OA and lumbar degenerative disease s/p laminectomy and fusion confirmed on prior imaging.  Today, Nancy [name] is expressing diffuse joint and soft tissue pain, that has acutely worsened in the last several days. When asked about specific joints, she notes pain "from her toes to her head". On exam, she is tender over diffuse joints as well as soft tissue. No evidence of swelling or synovitis in small joints. No swelling of larger joints noted. Difficulty with ambulation due to pain. She has had flares such as this in the past, often brought on by weather, for which she previously took as needed hydrocodone-acetaminophen. Per PMP, last prescription was 11/16/20 and patient reports running out two weeks ago. Although exam is not consistent with inflammatory arthritis, given unclear autoimmune history and possible Sjogren's we discussed autoimmune arthritis evaluation and possible rheumatology referral. Also possible that this is fibromyalgia, however will need to evaluate other criteria further. Discussed starting Cymbalta today for chronic lower back and OA pain, however patient declined. She was also not interested in referral to pain clinic for injections. Previously trialed Tylenol without effect. Given intermittent and infrequent use of pain medication, we discussed trial of meloxicam or tramadol for acute on chronic pain episodes. Prescriptions of both given today and will schedule follow-up in one month to evaluate further need. Could consider TCA vs Flexeril if concern for FM higher. Plan -Meloxicam 7.5 mg  daily as needed for severe pain. Instructed to take with food and to not use more frequently than once/week given HTN.  -Tramadol 50 mg q6-8 hours prn for severe pain, 20 tabs. PDMP reviewed and appropriate. Discussed appropriate use and potential side effects. Will assess need for chronic use and medication contract at follow-up appointment.

## 2021-03-17 NOTE — Assessment & Plan Note (Addendum)
Chart history of Sjogren's syndrome and sicca syndrome. When discussed, patient does not remember being diagnosed with these conditions. She reports last seeing a rheumatologist at [facility] "30 years ago". Per chart review, "anti Ro+; ANA>1:1280 in homogen pattern, negative ds DNA/RF/anti Smith/RNP/C3-4 comp/la/jo1/Scleroderma/centromere, neg HIV/ACE/Hep B/C, nl CXR 2/05, schirmer salivary gland test not done, symptom rx:eye drops/prednisone/plaquenil referral to [facility] rheum, Dr. Wynelle Cleveland 3/07. saw Dr. Marveen Reeks but stopped 2/2 cost."  Patient does endorse dry eyes and mouth, however does not appear that formal salivary study or eye exam done. Notes she was previously told she had arthritis, confirmed in bilateral knees and lumbar spine on imaging. Now expressing diffuse joint and soft tissue pain, that has acutely worsened in the last several days (see polyarthralgia).  Plan -Repeat SSA/SSB Ab, ESR, RF, CCP, ANA -Discussed referral back to rheumatology pending lab results, patient was agreeable. May also need referral to ophthalmology for Schirmer vs other evaluation of dry eye.

## 2021-03-17 NOTE — Assessment & Plan Note (Addendum)
COVID booster UTD. Offered Tdap today, patient declined given current pain. Will plan for next visit. Would recommend continued Pap screening given last completed in 2013. Patient reports no history of abnormal. She would like to wait until future visit. DEXA discussed and ordered today. Message sent to scheduling staff for mammogram.

## 2021-03-17 NOTE — Progress Notes (Signed)
Subjective:   Patient ID: [name] female   DOB: Nov 30, 1955 66 y.o.   MRN: [id number]  HPI: [name] is a 66 y.o. with history of hypertension, OSA on CPAP, hypothyroidism, chronic low back and knee pain, polyarthralgia, ?Sjogren's/sicca syndrome, chronic microcytic anemia, and obesity who presents for follow-up of blood pressure and to discuss pain.  Please see problem-chased charting for further details.  Past Medical History:  Diagnosis Date   Acute cholecystitis 01/21/2021   AKI (acute kidney injury) (HCC) 12/04/2018   Back pain    chronic low back pain L4-5 discectomy:11/99. degenerative thoracic spondylotic changes 4/07   Chest pain    neg adenosine myoview.   Hypertension    no LVH EKG-7/05, nl M/C ratio 4/07   Hypothyroidism    09/1998   Lower extremity edema    echo EF 55-65% w/o evidence of Dias dysfx.,    Menorrhagia    Microcytic anemia    09/1998. Hb-10.5/MCV 76. needs ferritin to determine ACD vs IDA   Morbid obesity (HCC)    Obstructive sleep apnea    severe 7/05 RD 161 per hr. /CPAP 18cwp   Pneumonia due to COVID-19 virus 12/04/2018   Polyarthralgia    (knee/back/ankle) w/dx of fibromyalgia? given by [facility] rheum, knee pain chronic 2/2 obesity, fibromyalgia and Sjogren's.   Primary Sjogren's syndrome (HCC)    anti Ro+;ANA>1:1280 in homogen pattern, negative ds DNA/RF/anti Smith/RNP/C3-4 comp/la/jo1/Scleroderma/centromere, neg HIV/ACE/Hep B/C, nl CXR 2/05, schirmer salivary glandtest not done, symptom rx:eye drops/prednisone/plaquenil referral to [facility] rheum, [name] 3/07. saw [name] but stopped 2/2 cost.   Uterine bleeding    Viral URI with cough 11/03/2017   Current Outpatient Medications  Medication Sig Dispense Refill   meloxicam (MOBIC) 7.5 MG tablet Take 1 tablet (7.5 mg total) by mouth daily. 10 tablet 0   traMADol (ULTRAM) 50 MG tablet Take one tablet every 6-8 hours as needed for pain. 20 tablet 0   albuterol (PROVENTIL  HFA;VENTOLIN HFA) 108 (90 Base) MCG/ACT inhaler Inhale 1-2 puffs every 6 (six) hours as needed into the lungs for wheezing or shortness of breath. 1 Inhaler 0   APPLE CIDER VINEGAR PO Take 1 tablet by mouth daily. Gummy Goli     cholecalciferol (VITAMIN D3) 25 MCG (1000 UNIT) tablet Take 1,000 Units by mouth daily.     diclofenac Sodium (VOLTAREN) 1 % GEL Apply 4 g topically every 6 (six) hours as needed (arthritis pain). 150 g 0   levothyroxine (SYNTHROID) 50 MCG tablet Take 1 tablet (50 mcg total) by mouth daily. 90 tablet 3   losartan (COZAAR) 50 MG tablet Take 1 tablet (50 mg total) by mouth daily. For blood pressure. 30 tablet 11   metoprolol succinate (TOPROL-XL) 50 MG 24 hr tablet Take 1 tablet (50 mg total) by mouth daily. Take with or immediately following a meal. 90 tablet 3   No current facility-administered medications for this visit.   Family History  Problem Relation Age of Onset   Hypertension Mother    Cancer Mother    Cancer Father    Cancer Sister    Ovarian cancer Other        family history   Breast cancer Other        family history in 1st degree relatives.   Thyroid disease Sister    Colon cancer Neg Hx    Stomach cancer Neg Hx    Social History   Socioeconomic History   Marital status: Married  Spouse name: Not on file   Number of children: Not on file   Years of education: Not on file   Highest education level: Not on file  Occupational History   Occupation: house wife    Employer: UNEMPLOYED  Tobacco Use   Smoking status: Never   Smokeless tobacco: Never  Substance and Sexual Activity   Alcohol use: No    Alcohol/week: 0.0 standard drinks   Drug use: No   Sexual activity: Not on file  Other Topics Concern   Not on file  Social History Narrative   Her daughter Nancy [name]) often comes with her to visits. She also has a grand daughter and great grand daughter(whose name is Nancy [name])   Social Determinants of Corporate investment banker Strain: Not on  file  Food Insecurity: Not on file  Transportation Needs: Not on file  Physical Activity: Not on file  Stress: Not on file  Social Connections: Not on file   Review of Systems: Pertinent items are noted in HPI.  Objective:  Physical Exam: Vitals:   03/17/21 0953 03/17/21 1056  BP: (!) 182/91 (!) 171/79  Pulse: 86 74  Temp: 98.7 F (37.1 C)   TempSrc: Oral   SpO2: 99%   Weight: 248 lb 12.8 oz (112.9 kg)   Height: 5\' 3"  (1.6 m)    General appearance: alert, cooperative, moderate distress, and morbidly obese Back: multiple tender areas over bilateral paraspinal muscles as well as lumbar spinous processes  Abdomen: soft, non-tender, well-healed cholecystectomy scars, no evidence of mass Extremities: diffuse TTP over joints, including bilateral ankles, knees, wrists, elbows, and shoulders, as well as soft tissue; no significant swelling noted throughout, no joint deformities noted Skin: Skin color, texture, turgor normal. No rashes or lesions Neurologic: Grossly normal  Assessment & Plan:   See Encounters tab for problem-based charting.  Return in about 4 weeks (around 04/14/2021) for f/u BP and pain.  06/12/2021, MD

## 2021-03-17 NOTE — Assessment & Plan Note (Addendum)
Blood pressure remains uncontrolled at today's visit. Started on losartan 25 mg since last visit. Patient notes she is in significant pain today, however she did take her medications last night and has been consistent with them and noting home SBP readings 130s-140s. Patient and her husband noted that she eats quite a bit of salt in her diet, which we discussed trying to decrease/cut out. We discussed goal <130/80 and with home pressures remaining elevated, we will plan to increase losartan to 50 mg daily.    Plan: -Increase losartan to 50 mg daily -BMP today -Return in one month for f/u and labs

## 2021-03-17 NOTE — Patient Instructions (Addendum)
It was good to see you today!  For blood pressure, we have increased your losartan to 50 mg daily.   Please take your thyroid medicine in the morning without other medications and at least 30-60 minutes before eating.  For pain, you may try meloxicam 7.5 mg once daily as needed. We would not like you to take this more than once per week. You may also try the tramadol 50 mg every 6-8 hours as needed. Please do not drive after taking this medication.

## 2021-03-17 NOTE — Addendum Note (Signed)
Addended by: Charise Killian on: 03/17/2021 01:48 PM   Modules accepted: Orders

## 2021-03-17 NOTE — Assessment & Plan Note (Addendum)
History of polyarthralgia per chart review. Possible diagnosis of fibromyalgia given by prior rheumatology evaluation although not available per chart. Previously on acetaminophen/codeine with our clinic, however was switched to hydrocodone/acetaminophen per outside provider prior to returning to [facility]. Bilateral knee OA and lumbar degenerative disease s/p laminectomy and fusion confirmed on prior imaging.  Today, Nancy [name] is expressing diffuse joint and soft tissue pain, that has acutely worsened in the last several days. When asked about specific joints, she notes pain "from her toes to her head". On exam, she is tender over diffuse joints as well as soft tissue. No evidence of swelling or synovitis in small joints. No swelling of larger joints noted. Difficulty with ambulation due to pain. She has had flares such as this in the past, often brought on by weather, for which she previously took as needed hydrocodone-acetaminophen. Per PMP, last prescription was 11/16/20 and patient reports running out two weeks ago. Although exam is not consistent with inflammatory arthritis, given unclear autoimmune history and possible Sjogren's we discussed autoimmune arthritis evaluation and possible rheumatology referral. Also possible that this is fibromyalgia, however will need to evaluate other criteria further. Discussed starting Cymbalta today for chronic lower back and OA pain, however patient declined. She was also not interested in referral to pain clinic for injections. Previously trialed Tylenol without effect. Given intermittent and infrequent use of pain medication, we discussed trial of meloxicam or tramadol for acute on chronic pain episodes. Prescriptions of both given today and will schedule follow-up in one month to evaluate further need. Could consider TCA vs Flexeril if concern for FM higher. Plan -Meloxicam 7.5 mg daily as needed for severe pain. Instructed to take with food and to not use more frequently  than once/week given HTN.  -Tramadol 50 mg q6-8 hours prn for severe pain, 20 tabs. PDMP reviewed and appropriate. Discussed appropriate use and potential side effects. Will assess need for chronic use and medication contract at follow-up appointment.

## 2021-03-17 NOTE — Assessment & Plan Note (Signed)
History of chronic microcytic anemia, previous chart history of iron deficiency. Patient denies bloody or black stools, as well as vaginal bleeding. CSY normal 2015. Iron studies 12/2020 not consistent with IDA. Ferritin normal, making ACD less likely.  Plan -Repeat CBC, peripheral smear

## 2021-03-17 NOTE — Assessment & Plan Note (Signed)
Weight is stable since October. Patient brought up concerns regarding weight near the end of today's visit. We briefly discussed lifestyle management and possibility of medical/surgical management. Discussed the likely benefit of weight loss on pain as well.  Plan -Follow-up at next visit, continue to encourage lifestyle management and further discuss medical/surgical options

## 2021-03-17 NOTE — Assessment & Plan Note (Signed)
No evidence of ecchymosis or palpable mass on abdominal exam. Likely resolved hematoma prior clinic evaluation. Plan -Resolved.

## 2021-03-18 LAB — PATHOLOGIST SMEAR REVIEW

## 2021-03-19 LAB — BMP8+ANION GAP
Anion Gap: 13 mmol/L (ref 10.0–18.0)
BUN/Creatinine Ratio: 16 (ref 12–28)
BUN: 13 mg/dL (ref 8–27)
CO2: 24 mmol/L (ref 20–29)
Calcium: 8.9 mg/dL (ref 8.7–10.3)
Chloride: 101 mmol/L (ref 96–106)
Creatinine, Ser: 0.82 mg/dL (ref 0.57–1.00)
Glucose: 95 mg/dL (ref 70–99)
Potassium: 4.5 mmol/L (ref 3.5–5.2)
Sodium: 138 mmol/L (ref 134–144)
eGFR: 78 mL/min/{1.73_m2}

## 2021-03-19 LAB — LIPID PANEL
Chol/HDL Ratio: 3.9 ratio (ref 0.0–4.4)
Cholesterol, Total: 233 mg/dL — ABNORMAL HIGH (ref 100–199)
HDL: 60 mg/dL
LDL Chol Calc (NIH): 152 mg/dL — ABNORMAL HIGH (ref 0–99)
Triglycerides: 117 mg/dL (ref 0–149)
VLDL Cholesterol Cal: 21 mg/dL (ref 5–40)

## 2021-03-19 LAB — RHEUMATOID FACTOR: Rheumatoid fact SerPl-aCnc: 75.1 [IU]/mL — ABNORMAL HIGH

## 2021-03-19 LAB — CYCLIC CITRUL PEPTIDE ANTIBODY, IGG/IGA: Cyclic Citrullin Peptide Ab: >250 U — ABNORMAL HIGH (ref 0–19)

## 2021-03-19 LAB — SJOGREN'S SYNDROME ANTIBODS(SSA + SSB)
ENA SSA (RO) Ab: >8 AI — ABNORMAL HIGH (ref 0.0–0.9)
ENA SSB (LA) Ab: <0.2 AI (ref 0.0–0.9)

## 2021-03-19 LAB — ANTINUCLEAR ANTIBODIES, IFA: ANA Titer 1: NEGATIVE

## 2021-03-19 LAB — SEDIMENTATION RATE: Sed Rate: 38 mm/h (ref 0–40)

## 2021-03-21 ENCOUNTER — Telehealth: Payer: Self-pay | Admitting: Internal Medicine

## 2021-03-21 DIAGNOSIS — R7689 Other specified abnormal immunological findings in serum: Secondary | ICD-10-CM

## 2021-03-21 DIAGNOSIS — R768 Other specified abnormal immunological findings in serum: Secondary | ICD-10-CM

## 2021-03-21 DIAGNOSIS — M2550 Pain in unspecified joint: Secondary | ICD-10-CM

## 2021-03-21 NOTE — Telephone Encounter (Signed)
Attempted to reach Nancy [name] to discuss recent lab results. No identifying voicemail. Will try again later.

## 2021-03-21 NOTE — Telephone Encounter (Signed)
Discussed lab results with Nancy [name]. Concern for autoimmune disease causing polyarthralgias. RA + Sjogrens vs other given lab pattern, although unusual that ANA is negative and ESR not significantly elevated. Will refer to rheumatology to which Nancy [name] is agreeable. Pain is somewhat improved with medications provided at last visit. Cholesterol elevated, ASCVD risk 21.6%, high intensity statin recommended. Nancy [name] is interested in medication to lower cholesterol but would like to talk about it further at next visit in February. Discussed microcytic anemia and possible thalassemia, will plan for Hgb electrophoresis at next visit.

## 2021-03-27 ENCOUNTER — Other Ambulatory Visit: Payer: Self-pay | Admitting: Internal Medicine

## 2021-03-27 DIAGNOSIS — M25561 Pain in right knee: Secondary | ICD-10-CM

## 2021-03-27 DIAGNOSIS — M545 Low back pain, unspecified: Secondary | ICD-10-CM

## 2021-03-27 DIAGNOSIS — M25562 Pain in left knee: Secondary | ICD-10-CM

## 2021-04-15 ENCOUNTER — Ambulatory Visit: Payer: Medicare PPO

## 2021-04-15 ENCOUNTER — Telehealth: Payer: Self-pay

## 2021-04-15 NOTE — Telephone Encounter (Signed)
Patient had called in about her wheelchair, she is going to have an evaluation done by PT and she had lost the number.I provided the number for her.Patient will call them back as they had left her a message [place], [place] C2/7/20239:54 AM

## 2021-04-21 ENCOUNTER — Other Ambulatory Visit (HOSPITAL_COMMUNITY): Payer: Self-pay

## 2021-04-28 ENCOUNTER — Other Ambulatory Visit: Payer: Self-pay

## 2021-04-28 ENCOUNTER — Encounter: Payer: Self-pay | Admitting: Internal Medicine

## 2021-04-28 ENCOUNTER — Ambulatory Visit (HOSPITAL_COMMUNITY)
Admission: RE | Admit: 2021-04-28 | Discharge: 2021-04-28 | Disposition: A | Discharge status: Home / Self Care | Payer: Medicare PPO | Source: Ambulatory Visit | Attending: Internal Medicine | Admitting: Internal Medicine

## 2021-04-28 ENCOUNTER — Ambulatory Visit (INDEPENDENT_AMBULATORY_CARE_PROVIDER_SITE_OTHER): Payer: Medicare PPO | Admitting: Internal Medicine

## 2021-04-28 VITALS — BP 194/83 | HR 71 | Temp 98.0°F | Wt 249.4 lb

## 2021-04-28 DIAGNOSIS — M2550 Pain in unspecified joint: Secondary | ICD-10-CM

## 2021-04-28 DIAGNOSIS — M25561 Pain in right knee: Secondary | ICD-10-CM

## 2021-04-28 DIAGNOSIS — G8929 Other chronic pain: Secondary | ICD-10-CM | POA: Diagnosis not present

## 2021-04-28 DIAGNOSIS — M3500 Sicca syndrome, unspecified: Secondary | ICD-10-CM | POA: Diagnosis not present

## 2021-04-28 DIAGNOSIS — M1712 Unilateral primary osteoarthritis, left knee: Secondary | ICD-10-CM | POA: Insufficient documentation

## 2021-04-28 DIAGNOSIS — E785 Hyperlipidemia, unspecified: Secondary | ICD-10-CM

## 2021-04-28 DIAGNOSIS — Z23 Encounter for immunization: Secondary | ICD-10-CM | POA: Insufficient documentation

## 2021-04-28 DIAGNOSIS — D509 Iron deficiency anemia, unspecified: Secondary | ICD-10-CM

## 2021-04-28 DIAGNOSIS — M25562 Pain in left knee: Secondary | ICD-10-CM | POA: Diagnosis not present

## 2021-04-28 DIAGNOSIS — I10 Essential (primary) hypertension: Secondary | ICD-10-CM | POA: Diagnosis not present

## 2021-04-28 DIAGNOSIS — Z6841 Body Mass Index (BMI) 40.0 and over, adult: Secondary | ICD-10-CM | POA: Diagnosis not present

## 2021-04-28 DIAGNOSIS — Z0000 Encounter for general adult medical examination without abnormal findings: Secondary | ICD-10-CM

## 2021-04-28 MED ORDER — ATORVASTATIN CALCIUM 40 MG PO TABS: 40.0000 mg | ORAL_TABLET | Freq: Every day | ORAL | 3 refills | Status: DC

## 2021-04-28 MED ORDER — TOPIRAMATE 25 MG PO TABS: 25.0000 mg | ORAL_TABLET | Freq: Every day | ORAL | 11 refills | Status: DC

## 2021-04-28 MED ORDER — TOPIRAMATE 50 MG PO TABS: 50.0000 mg | ORAL_TABLET | Freq: Every day | ORAL | 11 refills | Status: DC

## 2021-04-28 NOTE — Assessment & Plan Note (Addendum)
Nancy [name] has chronic history of bilateral knee and low back pain. For the past couple of weeks, she has had worsened pain in the left knee. No interceding trauma or injury that she is aware of. Prior Xray showing tricompartment degenerative changes of both knees. She was able to get her power wheelchair repaired which has helped with mobility. Using Voltaren gel as needed. She also took meloxicam 15 mg which did help ease the pain some. She only took this about once/week. She tried tramadol 50 mg which did not help. No further joints bothering her today, improvement in polyarthralgia seen at last visit. Left knee exam without significant effusion, no erythema or warmth. TTP over medial and lateral joint line, no posterior tenderness. Suspect worsening of OA, lower suspicion for gout or infectious etiology. We discussed repeat XR of left knee to assess worsening pain, as well as possible orthopedics referral for injection (given possible need for US guidance and difficult anatomy). She declined orthopedics and injection today, as well as adamantly declines discussion of surgical intervention. Discussed resuming hydrocodone-acetaminophen that she had been on for chronic knee/back pain, however Nancy [name] requested to try increased dose of tramadol. Okay to increase to 100 mg as needed, she is taking very sparingly at this time. Will follow-up in one month for effect and discuss continuation of this medication/agreement at that time if indicated. -Left knee XR -Referral for PT -Tramadol 100 mg q6-8h prn  Addendum: XR showing worsening of severe left knee arthritis and mild patellofemoral arthritis. Nancy [name] notes increased dose of tramadol does help but she hasn't been taking often. Again discussed referral to orthopedics but she would like to wait. Encouraged her to call if not improving or finding relief with medication.

## 2021-04-28 NOTE — Assessment & Plan Note (Signed)
>>  ASSESSMENT AND PLAN FOR POLYARTHRALGIA WRITTEN ON 04/28/2021  2:36 PM BY LAU, GRACE, MD  Diffuse joint pain present at prior visit has mostly resolved today. Left knee pain worsened today although this has been a chronic issue. She notes her finger joints often get stiff and swollen although not significant today. Autoimmune w/u obtained at last visit with negative ANA, positive RF, positive anti-CCP, and positive SSA. Referral made to rheumatology for likely RA and possible Sjogren's. No indication for steroids or NSAIDs seen on today's exam, although could be considered as bridge if needed until established with rheumatology. She has used meloxicam prn, no more than once/week, with some success.

## 2021-04-28 NOTE — Assessment & Plan Note (Addendum)
Blood pressure elevated on initial recheck, minimal improvement on repeat. Nancy [name] reports not taking her BP medications this morning before the visit and is having severe left knee pain. She reports she has been adherent to medication otherwise. Unable to adjust regimen with untreated readings today. We have scheduled early f/u for other concerns, will also f/u BP at that time. Last increased losartan to 50 mg 03/17/21. Encouraged to take BP medications before next visit so we can make changes as needed. Plan -Continue losartan 50 mg daily, take all medications before next appointment -BMP today -Return in one month for f/u  Addendum: BMP wnl

## 2021-04-28 NOTE — Assessment & Plan Note (Addendum)
Patient is concerned about weight and its effects on her overall health and pain. I agree that current weight is likely worsening chronic pain, however pain is limiting current physical activity as well. Discussed lifestyle/dietary changes at previous visit. Patient adamantly declines referral for bariatric surgery. We discussed pharmacologic options. Semaglutide [facility]) not covered by patient insurance. Phentermine not an option in the setting of her uncontrolled BP. We discussed off-label use of topiramate, unsure what the cost implications of this will be. She wanted to try this medication and I encouraged her to call her pharmacy to ask about cost prior to filling. Discussed possible SE and reasons to call me prior to next visit.  Addendum: Patient reports topiramate is available at low cost. She was told by pharmacy that a PA may be available for [facility] and they will send me the paperwork.

## 2021-04-28 NOTE — Assessment & Plan Note (Signed)
Diffuse joint pain present at prior visit has mostly resolved today. Left knee pain worsened today although this has been a chronic issue. She notes her finger joints often get stiff and swollen although not significant today. Autoimmune w/u obtained at last visit with negative ANA, positive RF, positive anti-CCP, and positive SSA. Referral made to rheumatology for likely RA and possible Sjogren's. No indication for steroids or NSAIDs seen on today's exam, although could be considered as bridge if needed until established with rheumatology. She has used meloxicam prn, no more than once/week, with some success.

## 2021-04-28 NOTE — Assessment & Plan Note (Addendum)
Positive SSA Ab at last visit 03/17/21, as well as positive RA studies. ANA negative. Advised that I would like Ms. Keagle to have formal Schirmer study done for dry eye and support of Sjogren's syndrome, however Ms. Odwyer reports she already had this completed previously with Dr. Dione Booze. Previous charting notes no Schirmer completed. Encouraged to have this repeated at next eye appointment. We have also referred to rheumatology and patient is scheduled to see Dr. Dimple Casey in 05/2021. -Continue current eye drops -F/u with Dr. Dione Booze for repeat Schirmer -Establish with Dr. Dimple Casey, rheumatology

## 2021-04-28 NOTE — Assessment & Plan Note (Signed)
Elevated ASCVD risk score of 27.5%. Discussed initiation of statin therapy, patient was agreeable. Reviewed potential side effects. -Atorvastatin 40 mg daily -Repeat lipid panel in 4-6 weeks

## 2021-04-28 NOTE — Progress Notes (Signed)
Subjective:   Patient ID: Nancy [name] female   DOB: 1955/12/15 66 y.o.   MRN: [id number]  HPI: [name] is a 66 y.o. with history of hypertension, OSA on CPAP, hypothyroidism, chronic low back and knee pain, polyarthralgia w/ positive RF and anti-CCP, Sjogren's/sicca syndrome, chronic microcytic anemia, and obesity who presents for follow-up of blood pressure and to discuss weight and left knee pain.  The 10-year ASCVD risk score (Arnett DK, et al., 2019) is: 27.5%   Values used to calculate the score:     Age: 66 years     Sex: Female     Is Non-Hispanic African American: Yes     Diabetic: No     Tobacco smoker: No     Systolic Blood Pressure: [id number] mmHg     Is BP treated: Yes     HDL Cholesterol: 60 mg/dL     Total Cholesterol: 233 mg/dL  Past Medical History:  Diagnosis Date   Acute cholecystitis 01/21/2021   AKI (acute kidney injury) ([place]) 12/04/2018   Back pain    chronic low back pain L4-5 discectomy:11/99. degenerative thoracic spondylotic changes 4/07   Chest pain    neg adenosine myoview.   Hypertension    no LVH EKG-7/05, nl M/C ratio 4/07   Hypothyroidism    09/1998   Lower extremity edema    echo EF 55-65% w/o evidence of Dias dysfx.,    Menorrhagia    Microcytic anemia    09/1998. Hb-10.5/MCV 76. needs ferritin to determine ACD vs IDA   Morbid obesity ([place])    Obstructive sleep apnea    severe 7/05 RD 161 per hr. /CPAP 18cwp   Pneumonia due to COVID-19 virus 12/04/2018   Polyarthralgia    (knee/back/ankle) w/dx of fibromyalgia? given by [facility] rheum, knee pain chronic 2/2 obesity, fibromyalgia and Sjogren's.   Primary Sjogren's syndrome ([place])    anti Ro+;ANA>1:1280 in homogen pattern, negative ds DNA/RF/anti Smith/RNP/C3-4 comp/la/jo1/Scleroderma/centromere, neg HIV/ACE/Hep B/C, nl CXR 2/05, schirmer salivary glandtest not done, symptom rx:eye drops/prednisone/plaquenil referral to [facility] rheum, Dr. Wynelle Cleveland 3/07. saw [name] but stopped 2/2  cost.   Uterine bleeding    Viral URI with cough 11/03/2017   Current Outpatient Medications  Medication Sig Dispense Refill   atorvastatin (LIPITOR) 40 MG tablet Take 1 tablet (40 mg total) by mouth daily. 90 tablet 3   albuterol (PROVENTIL HFA;VENTOLIN HFA) 108 (90 Base) MCG/ACT inhaler Inhale 1-2 puffs every 6 (six) hours as needed into the lungs for wheezing or shortness of breath. 1 Inhaler 0   APPLE CIDER VINEGAR PO Take 1 tablet by mouth daily. Gummy Goli     cholecalciferol (VITAMIN D3) 25 MCG (1000 UNIT) tablet Take 1,000 Units by mouth daily.     diclofenac Sodium (VOLTAREN) 1 % GEL Apply 4 g topically every 6 (six) hours as needed (arthritis pain). 150 g 0   levothyroxine (SYNTHROID) 50 MCG tablet Take 1 tablet (50 mcg total) by mouth daily. 90 tablet 3   losartan (COZAAR) 50 MG tablet Take 1 tablet (50 mg total) by mouth daily. For blood pressure. 30 tablet 11   meloxicam (MOBIC) 7.5 MG tablet Take 1 tablet (7.5 mg total) by mouth daily as needed for pain (severe pain). 10 tablet 0   metoprolol succinate (TOPROL-XL) 50 MG 24 hr tablet Take 1 tablet (50 mg total) by mouth daily. Take with or immediately following a meal. 90 tablet 3   topiramate (TOPAMAX) 25 MG tablet Take 1 tablet (25  mg total) by mouth at bedtime. 30 tablet 11   traMADol (ULTRAM) 50 MG tablet Take one tablet every 6-8 hours as needed for pain. 20 tablet 0   No current facility-administered medications for this visit.   Family History  Problem Relation Age of Onset   Hypertension Mother    Cancer Mother    Cancer Father    Cancer Sister    Ovarian cancer Other        family history   Breast cancer Other        family history in 1st degree relatives.   Thyroid disease Sister    Colon cancer Neg Hx    Stomach cancer Neg Hx    Social History   Socioeconomic History   Marital status: Married    Spouse name: Not on file   Number of children: Not on file   Years of education: Not on file   Highest  education level: Not on file  Occupational History   Occupation: house wife    Employer: UNEMPLOYED  Tobacco Use   Smoking status: Never   Smokeless tobacco: Never  Substance and Sexual Activity   Alcohol use: No    Alcohol/week: 0.0 standard drinks   Drug use: No   Sexual activity: Not on file  Other Topics Concern   Not on file  Social History Narrative   Her daughter Nancy [name]) often comes with her to visits. She also has a grand daughter and great grand daughter(whose name is Investment banker, operational)   Social Determinants of Health   Financial Resource Strain: Not on file  Food Insecurity: Not on file  Transportation Needs: Not on file  Physical Activity: Not on file  Stress: Not on file  Social Connections: Not on file   Objective:  Physical Exam: Vitals:   04/28/21 0944 04/28/21 1043  BP: (!) 195/86 (!) 194/83  Pulse: 73 71  Temp: 98 F (36.7 C)   TempSrc: Oral   SpO2: 97%   Weight: 249 lb 6.4 oz (113.1 kg)    General appearance: alert, cooperative, appears stated age, no distress, and seated in power wheelchair Extremities: left knee with TTP along medial and lateral joint line, no significant effusion, erythema, or warmth noted. No posterior tenderness or mass. Difficult to appreciate anatomic landmarks given body habitus. Mild swelling of small joints of the hands without TTP, warmth, or erythema. Assessment & Plan:   See Encounters tab for problem-based charting.  Return in about 4 weeks (around 05/26/2021) for f/u BP.  Charise Killian, MD

## 2021-04-28 NOTE — Assessment & Plan Note (Addendum)
Chronic microcytic anemia. Recent iron studies not consistent with IDA. Ferritin normal. Will check Hgb electrophoresis for possible thalassemia today. No family history that patient knows of.  Addendum: Hgb electrophoresis negative for beta-thalassemia.

## 2021-04-28 NOTE — Assessment & Plan Note (Signed)
Overdue for tetanus booster, no documentation of Tdap as an adult. Recommend she reach out to her pharmacy to obtain for insurance coverage.

## 2021-04-28 NOTE — Patient Instructions (Addendum)
Thank you, Nancy [name] for allowing [place] to provide your care today. Today we discussed knee pain, weight, and blood pressure.  I have ordered the following labs for you:  Lab Orders         Hgb Fractionation [place]         BMP8+Anion Gap      I will call if any are abnormal. All of your labs can be accessed through "My Chart".  I have ordered the following tests: left knee xray   I have ordered the following medication/changed the following medications:  Added topiramate 25 mg nightly (for weight). Please call your pharmacy to check on the cost before filling. Added atorvastatin 40 mg daily (for cholesterol). You may try taking tramadol 100 mg (2 tablets) for pain as needed. Please call if not helping.  You may ask your pharmacy about the cost of semaglutide [facility]) for weight loss.   Please continue to monitor your blood pressure and take your medications before next visit.  Please contact your pharmacy about obtaining the shingles (Shingrix) and Tdap vaccine.  Please follow-up in in 4 weeks.  Should you have any questions or concerns please call the internal medicine clinic at [phone].    Dickie La, MD [facility] Internal Medicine   My Chart Access: [email or web address]   If you have not already done so, please get your COVID 19 vaccine  To schedule an appointment for a COVID vaccine choice any of the following: Go to [email or web address]   Go to [email or web address]                  Call [phone]                                     Call [phone] and select Option 2

## 2021-04-30 LAB — BMP8+ANION GAP
Anion Gap: 12 mmol/L (ref 10.0–18.0)
BUN/Creatinine Ratio: 14 (ref 12–28)
BUN: 12 mg/dL (ref 8–27)
CO2: 26 mmol/L (ref 20–29)
Calcium: 9 mg/dL (ref 8.7–10.3)
Chloride: 100 mmol/L (ref 96–106)
Creatinine, Ser: 0.84 mg/dL (ref 0.57–1.00)
Glucose: 88 mg/dL (ref 70–99)
Potassium: 4.6 mmol/L (ref 3.5–5.2)
Sodium: 138 mmol/L (ref 134–144)
eGFR: 76 mL/min/{1.73_m2}

## 2021-04-30 LAB — HGB FRACTIONATION CASCADE
Hgb A2: 2.4 % (ref 1.8–3.2)
Hgb A: 97.6 % (ref 96.4–98.8)
Hgb F: 0 % (ref 0.0–2.0)
Hgb S: 0 %

## 2021-04-30 NOTE — Assessment & Plan Note (Signed)
Addendum: Mammogram has yet to be scheduled. Referral coordinator notes that multiple attempts have been made to reach Ms. Breece and she was provided with the phone number to schedule when ready. Ms. Bebee says she will do so once her knee is feeling a little better.

## 2021-05-02 ENCOUNTER — Telehealth: Payer: Self-pay | Admitting: Internal Medicine

## 2021-05-02 NOTE — Telephone Encounter (Signed)
Called patient this afternoon to check in on pain. Left knee pain is getting better. However, she is noticing increasing stiffness and pain in her hands/finger joints. Discussed that this may be from RA and would like her to be seen for further medication management. She is currently taking meloxicam once daily which is helping, may consider short course of steroid until establishment with rheumatology in March. Will send a message to front desk for scheduling, encouraged to call with any further concerns.

## 2021-05-08 ENCOUNTER — Ambulatory Visit (INDEPENDENT_AMBULATORY_CARE_PROVIDER_SITE_OTHER): Payer: Medicare PPO | Admitting: Student

## 2021-05-08 VITALS — BP 179/82 | HR 67 | Temp 98.4°F | Wt 255.3 lb

## 2021-05-08 DIAGNOSIS — M79642 Pain in left hand: Secondary | ICD-10-CM | POA: Diagnosis not present

## 2021-05-08 DIAGNOSIS — M25562 Pain in left knee: Secondary | ICD-10-CM

## 2021-05-08 DIAGNOSIS — I10 Essential (primary) hypertension: Secondary | ICD-10-CM

## 2021-05-08 DIAGNOSIS — M25561 Pain in right knee: Secondary | ICD-10-CM

## 2021-05-08 DIAGNOSIS — M2550 Pain in unspecified joint: Secondary | ICD-10-CM

## 2021-05-08 DIAGNOSIS — M79641 Pain in right hand: Secondary | ICD-10-CM

## 2021-05-08 DIAGNOSIS — G8929 Other chronic pain: Secondary | ICD-10-CM

## 2021-05-08 MED ORDER — LOSARTAN POTASSIUM 100 MG PO TABS: 100.0000 mg | ORAL_TABLET | Freq: Every day | ORAL | 0 refills | Status: DC

## 2021-05-08 MED ORDER — HYDROCODONE-ACETAMINOPHEN 5-325 MG PO TABS: 1.0000 | ORAL_TABLET | Freq: Four times a day (QID) | ORAL | 0 refills | Status: AC | PRN

## 2021-05-08 NOTE — Progress Notes (Signed)
? ?  CC: bilateral hand pain ? ?HPI: ? ?Nancy [name] is a 66 y.o. person with medical history as below presenting to [facility] for bilateral hand pain. ? ?Please see problem-based list for further details, assessments, and plans. ? ?Past Medical History:  ?Diagnosis Date  ? Acute cholecystitis 01/21/2021  ? AKI (acute kidney injury) ([facility]) 12/04/2018  ? Back pain   ? chronic low back pain L4-5 discectomy:11/99. degenerative thoracic spondylotic changes 4/07  ? Chest pain   ? neg adenosine myoview.  ? Hypertension   ? no LVH EKG-7/05, nl M/C ratio 4/07  ? Hypothyroidism   ? 09/1998  ? Lower extremity edema   ? echo EF 55-65% w/o evidence of Dias dysfx.,   ? Menorrhagia   ? Microcytic anemia   ? 09/1998. Hb-10.5/MCV 76. needs ferritin to determine ACD vs IDA  ? Morbid obesity ([facility])   ? Obstructive sleep apnea   ? severe 7/05 RD 161 per hr. /CPAP 18cwp  ? Pneumonia due to COVID-19 virus 12/04/2018  ? Polyarthralgia   ? (knee/back/ankle) w/dx of fibromyalgia? given by [facility] rheum, knee pain chronic 2/2 obesity, fibromyalgia and Sjogren's.  ? Primary Sjogren's syndrome ([facility])   ? anti Ro+;ANA>1:1280 in homogen pattern, negative ds DNA/RF/anti Smith/RNP/C3-4 comp/la/jo1/Scleroderma/centromere, neg HIV/ACE/Hep B/C, nl CXR 2/05, schirmer salivary glandtest not done, symptom rx:eye drops/prednisone/plaquenil referral to [facility] rheum, [name] 3/07. saw [name] but stopped 2/2 cost.  ? Uterine bleeding   ? Viral URI with cough 11/03/2017  ? ?Review of Systems:  As per HPI ? ?Physical Exam: ? ?Vitals:  ? 05/08/21 0957 05/08/21 1032  ?BP: (!) 202/93 (!) 179/82  ?Pulse: 83 67  ?Temp: 98.4 ?F (36.9 ?C)   ?TempSrc: Oral   ?SpO2: 97%   ?Weight: 255 lb 4.8 oz (115.8 kg)   ? ?General: Resting in wheelchair in no acute distress ?CV: Regular rate, rhythm. No murmurs appreciated. ?Pulm: Normal respiratory effort on room air. Clear to ausculation bilaterally. ?MSK: Bilateral hands mildly swollen without obvious abnormalities  or overlying skin changes. Tender to touch in all hand joints. Active and passive range of motion extremely limited 2/2 pain. Left knee without effusion, active ROM mildly limited due to pain.  ?Skin: Warm, dry. No rashes or lesions appreciated. ?Neuro: Awake, alert, conversing appropriately. ? ?Assessment & Plan:  ? ?See Encounters Tab for problem based charting. ? ?Patient discussed with Dr. Heide Spark ? ?

## 2021-05-08 NOTE — Assessment & Plan Note (Signed)
Blood pressure initially elevated to 202/93, improved to 179/82 on re-check. Patient reports compliance with blood pressure medications this morning. Mentions she typically checks her blood pressure in the mornings, usually with SBP 150-160's. Currently believes her blood pressure is more elevated due to her severe pain. Mentions she is compliant with her CPAP. Denies chest pain, dyspnea, lightheadedness. ? ?Today we will increase losartan to 100mg . Most likely BP significantly elevated 2/2 pain, although given home readings elevated to 150-160 she continues to be hypertensive. We will plan to increase losartan and have her return to clinic in one week.  ? ?- Increase losartan 100mg  once daily ?- Instructions given to take medication prior to next appt ?- Consider r/p BMP at next visit given dosage adjustment ?- Return to clinic in one week  ?

## 2021-05-08 NOTE — Patient Instructions (Signed)
Nancy [name], it was a pleasure seeing you today! ? ?Today we discussed: ?- Pain: I have prescribed 5 days of hydrocodone. It is important that you go to your rheumatologist appointment next week. Please make sure to come back in one week. ? ?- Blood pressure: I have increased your blood pressure medication. Please take losartan 100mg  daily. ? ? ?I have ordered the following medication/changed the following medications:  ? ?Stop the following medications: ?Medications Discontinued During This Encounter  ?Medication Reason  ? losartan (COZAAR) 50 MG tablet   ? traMADol (ULTRAM) 50 MG tablet   ?  ? ?Start the following medications: ?Meds ordered this encounter  ?Medications  ? HYDROcodone-acetaminophen (NORCO/VICODIN) 5-325 MG tablet  ?  Sig: Take 1 tablet by mouth every 6 (six) hours as needed for up to 5 days for moderate pain.  ?  Dispense:  20 tablet  ?  Refill:  0  ? losartan (COZAAR) 100 MG tablet  ?  Sig: Take 1 tablet (100 mg total) by mouth daily.  ?  Dispense:  30 tablet  ?  Refill:  0  ?  ? ?Follow-up:  1 week   ? ?Please make sure to arrive 15 minutes prior to your next appointment. If you arrive late, you may be asked to reschedule.  ? ?We look forward to seeing you next time. Please call our clinic at [phone] if you have any questions or concerns. The best time to call is Monday-Friday from 9am-4pm, but there is someone available 24/7. If after hours or the weekend, call the main hospital number and ask for the Internal Medicine Resident On-Call. If you need medication refills, please notify your pharmacy one week in advance and they will send [place] a request. ? ?Thank you for letting us take part in your care. Wishing you the best! ? ?Thank you, ?Sanjuan Dame, MD ?

## 2021-05-09 NOTE — Assessment & Plan Note (Signed)
>>  ASSESSMENT AND PLAN FOR POLYARTHRALGIA WRITTEN ON 05/09/2021 12:47 PM BY Evlyn Kanner, MD  Nancy [name] is presenting today with significant pain in bilateral hands. She states the pain has worsened over the last week or so and currently cannot make a fist or pick anything up. States the pain interferes with her daily activities, which puts more burden on her partner. Mentions her left knee pain has improved since her last visit w/ PCP on 2/20. Per previous notes, patient was on hydrocodone-acetaminophen as needed. More recently, she has been using tramadol, although states this has not been working well for her. During last visit, tramdol was increased to 100mg , which has not improved her pain. Patient has plans to see rheumatology later this month.  On examination, bilateral hand and finger joints extremely tender to palpation, unable to fully assess range of motion due to pain. Given severity of pain, we will prescribe short course of opioid medication. Patient does have assistance at home and mentions that she has only taken this medication in the past as needed. We will prescribe five days of this and have her return to clinic in one week.  - Stop tramadol - Hydrocodone-acetaminophen 5-325mg  every 6 hours as needed #20 tablets - Return to clinic in one week

## 2021-05-09 NOTE — Assessment & Plan Note (Signed)
Nancy [name] is presenting today with significant pain in bilateral hands. She states the pain has worsened over the last week or so and currently cannot make a fist or pick anything up. States the pain interferes with her daily activities, which puts more burden on her partner. Mentions her left knee pain has improved since her last visit w/ PCP on 2/20. Per previous notes, patient was on hydrocodone-acetaminophen as needed. More recently, she has been using tramadol, although states this has not been working well for her. During last visit, tramdol was increased to 100mg , which has not improved her pain. Patient has plans to see rheumatology later this month. ? ?On examination, bilateral hand and finger joints extremely tender to palpation, unable to fully assess range of motion due to pain. Given severity of pain, we will prescribe short course of opioid medication. Patient does have assistance at home and mentions that she has only taken this medication in the past as needed. We will prescribe five days of this and have her return to clinic in one week. ? ?- Stop tramadol ?- Hydrocodone-acetaminophen 5-325mg  every 6 hours as needed #20 tablets ?- Return to clinic in one week ?

## 2021-05-12 NOTE — Progress Notes (Signed)
Internal Medicine Clinic Attending ? ?Case discussed with Dr. Braswell  At the time of the visit.  We reviewed the resident?s history and exam and pertinent patient test results.  I agree with the assessment, diagnosis, and plan of care documented in the resident?s note.  ?

## 2021-05-30 ENCOUNTER — Ambulatory Visit: Payer: Medicare PPO | Admitting: Internal Medicine

## 2021-06-02 ENCOUNTER — Encounter: Payer: Medicare PPO | Admitting: Internal Medicine

## 2021-06-02 NOTE — Patient Instructions (Incomplete)
Thank you, Ms.Marizol S Hurless for allowing [place] to provide your care today. Today we discussed  ? ?Please call the breast center to schedule your mammogram [phone]. ? ?Please follow-up in  ? ?Should you have any questions or concerns please call the internal medicine clinic at [phone].   ? ?Charise Killian, MD ?[facility] Internal Medicine ? ? ?My Chart Access: ?[email or web address] ? ? ?

## 2021-06-05 ENCOUNTER — Telehealth: Payer: Self-pay

## 2021-06-05 NOTE — Telephone Encounter (Signed)
Patient contacted to schedule mammogram.  ? ?RE: Mobile Mammo event located at: ? ?[place]  [facility]  ?      ?[address]    ?[place] [address]    ? ?Date: April 7th at 10:30am ? ? ?

## 2021-06-11 NOTE — Telephone Encounter (Signed)
LVM for pt to call back as soon as possible.  ?RE: reminder call for mobile mammo appt.  ?

## 2021-06-13 ENCOUNTER — Ambulatory Visit
Admission: RE | Admit: 2021-06-13 | Discharge: 2021-06-13 | Disposition: A | Discharge status: Home / Self Care | Payer: Medicare PPO | Source: Ambulatory Visit | Attending: Internal Medicine | Admitting: Internal Medicine

## 2021-06-13 DIAGNOSIS — Z1231 Encounter for screening mammogram for malignant neoplasm of breast: Secondary | ICD-10-CM

## 2021-06-16 ENCOUNTER — Encounter: Payer: Self-pay | Admitting: Internal Medicine

## 2021-06-23 ENCOUNTER — Ambulatory Visit (INDEPENDENT_AMBULATORY_CARE_PROVIDER_SITE_OTHER): Payer: Medicare PPO | Admitting: Internal Medicine

## 2021-06-23 ENCOUNTER — Encounter: Payer: Self-pay | Admitting: Internal Medicine

## 2021-06-23 ENCOUNTER — Other Ambulatory Visit: Payer: Self-pay

## 2021-06-23 VITALS — BP 196/86 | HR 63 | Temp 97.8°F | Ht 63.0 in | Wt 251.6 lb

## 2021-06-23 DIAGNOSIS — Z0000 Encounter for general adult medical examination without abnormal findings: Secondary | ICD-10-CM

## 2021-06-23 DIAGNOSIS — E785 Hyperlipidemia, unspecified: Secondary | ICD-10-CM | POA: Diagnosis not present

## 2021-06-23 DIAGNOSIS — I10 Essential (primary) hypertension: Secondary | ICD-10-CM | POA: Diagnosis not present

## 2021-06-23 DIAGNOSIS — M25562 Pain in left knee: Secondary | ICD-10-CM | POA: Diagnosis not present

## 2021-06-23 DIAGNOSIS — Z6841 Body Mass Index (BMI) 40.0 and over, adult: Secondary | ICD-10-CM

## 2021-06-23 MED ORDER — SEMAGLUTIDE-WEIGHT MANAGEMENT 0.25 MG/0.5ML ~~LOC~~ SOAJ: 0.2500 mg | SUBCUTANEOUS | 0 refills | Status: DC

## 2021-06-23 MED ORDER — HYDROCODONE-ACETAMINOPHEN 5-325 MG PO TABS: 1.0000 | ORAL_TABLET | Freq: Four times a day (QID) | ORAL | 0 refills | Status: DC | PRN

## 2021-06-23 MED ORDER — CHLORTHALIDONE 25 MG PO TABS: 25.0000 mg | ORAL_TABLET | Freq: Every day | ORAL | 11 refills | Status: DC

## 2021-06-23 MED ORDER — LOSARTAN POTASSIUM 100 MG PO TABS: 100.0000 mg | ORAL_TABLET | Freq: Every day | ORAL | 3 refills | Status: DC

## 2021-06-23 NOTE — Assessment & Plan Note (Signed)
Continuing to work on lifestyle modification for weight loss and treatment of obesity. Ms. Mcpeters has also been taking topiramate daily since last visit and has lost four pounds, although she notes she actually feels like she has more appetite than prior. 24-hr diet recall significant for fried chicken, mac and cheese, baked beans, and potato salad. Ms. Kozlowski reports she eats fried foods every other day. She targeted reduction of fried food as her primary intervention prior to next visit. We discussed cutting back on fried food to only 1-2x/week and replacing these foods with baked or other options. In addition, Ms. Deleon would like to get back to the [facility] pool for exercise although limited by pain at this time. Previously declined bariatric surgery referral. Will attempt prior authorization for semaglutide [facility]) as patient is not a candidate for phentermine, buproprion-naltrexone due to uncontrolled blood pressure. Significant weight loss would likely greatly impact Ms. Krager's quality of life, as well as risk factor modification for several chronic conditions including hypertension, hyperlipidemia, and chronic pain. ?

## 2021-06-23 NOTE — Patient Instructions (Signed)
Thank you, [name] for allowing [place] to provide your care today. Today we discussed chronic knee pain, blood pressure, and weight loss. ? ?I have ordered the following labs for you: ? ? ?Lab Orders    ?     Lipid Profile    ?     BMP8+Anion Gap    ?     ToxAssure Select,+Antidepr,UR     ? ?I will call if any are abnormal. All of your labs can be accessed through "My Chart". ? ?I have place a referrals to orthopedics for discussion of your knee pain. ? ?I have ordered the following medication/changed the following medications:  ?Start taking chlorthalidone 25 mg daily for blood pressure. Please keep a log of your pressures and bring to your next appointment. ?I have sent semaglutide [facility]) for weight loss. It may take a while to get the authorization for this medication completed. ? ?Please follow-up in one month. ? ?Should you have any questions or concerns please call the internal medicine clinic at [phone].   ? ?Charise Killian, MD ?[facility] Internal Medicine ? ? ?My Chart Access: ?[email or web address] ? ? ?If you have not already done so, please get your COVID 19 vaccine  ?To schedule an appointment for a COVID vaccine choice any of the following: ?Go to [email or web address]   ?Go to [email or web address]                  ?Call [phone]                                     ?Call [phone] and select Option 2  ?

## 2021-06-23 NOTE — Assessment & Plan Note (Signed)
Started on atorvastatin at last office visit 2/20 for elevated ASCVD risk score. R ?-Lipid panel ?

## 2021-06-23 NOTE — Assessment & Plan Note (Addendum)
Ongoing chronic left knee pain, worsened over the last several months. Recent XR showing worsening of severe left knee arthritis. Previously on acetaminophen-codeine (2020) and hydrocodone-acetaminophen (2022) for chronic pain. Upon transfer to [facility], patient was not requiring significant opioid therapy and Nancy [name] was trialed on tramadol and meloxicam without significant relief of pain. Short trial of hydrocodone-acetaminophen prescribed recently with improvement in pain when taken twice daily. Pain significantly impacts Nancy [name]'s ability to ambulate and decreases quality of life. We are working on weight loss, although physical activity significantly limited by pain. Previously Nancy [name] had been hesitant to discuss her pain with orthopedics but now willing to schedule a visit to discuss possible injection and/or surgical intervention if recommended. Given severe, chronic pain with functional limitation, we reviewed [facility] pain agreement today and patient is agreeable to follow. PDMP reviewed and appropriate. We have prescribed hydrocodone-acetaminophen 5-325 mg, 60 tablets, for 30 day supply. I do expect that Nancy [name] will need less frequent dosing and less tablets/months if other interventions are pursued such as injection/surgery and we will update contract at that time as appropriate. UDS today. Will f/u in one month when returning for BP. ?

## 2021-06-23 NOTE — Progress Notes (Signed)
Copy of Pain Contract given to pt. 

## 2021-06-23 NOTE — Progress Notes (Signed)
?Subjective:  ? ?Patient ID: Nancy [name] female   DOB: [birth year] 66 y.o.   MRN: [id number] ? ?HPI: ?Nancy [name] is a 66 y.o. person who presents for follow-up of hypertension, arthralgias, and obesity. ? ?For details of today's visit, please see problem-based charting. ? ?Past Medical History:  ?Diagnosis Date  ? Acute cholecystitis 01/21/2021  ? AKI (acute kidney injury) ([place]) 12/04/2018  ? Back pain   ? chronic low back pain L4-5 discectomy:11/99. degenerative thoracic spondylotic changes 4/07  ? Chest pain   ? neg adenosine myoview.  ? Hypertension   ? no LVH EKG-7/05, nl M/C ratio 4/07  ? Hypothyroidism   ? 09/1998  ? Lower extremity edema   ? echo EF 55-65% w/o evidence of Dias dysfx.,   ? Menorrhagia   ? Microcytic anemia   ? 09/1998. Hb-10.5/MCV 76. needs ferritin to determine ACD vs IDA  ? Morbid obesity ([place])   ? Obstructive sleep apnea   ? severe 7/05 RD 161 per hr. /CPAP 18cwp  ? Pneumonia due to COVID-19 virus 12/04/2018  ? Polyarthralgia   ? (knee/back/ankle) w/dx of fibromyalgia? given by [facility] rheum, knee pain chronic 2/2 obesity, fibromyalgia and Sjogren's.  ? Primary Sjogren's syndrome ([place])   ? anti Ro+;ANA>1:1280 in homogen pattern, negative ds DNA/RF/anti Smith/RNP/C3-4 comp/la/jo1/Scleroderma/centromere, neg HIV/ACE/Hep B/C, nl CXR 2/05, schirmer salivary glandtest not done, symptom rx:eye drops/prednisone/plaquenil referral to [facility] rheum, Dr. Wynelle Cleveland 3/07. saw [name] but stopped 2/2 cost.  ? Uterine bleeding   ? Viral URI with cough 11/03/2017  ? ?Current Outpatient Medications  ?Medication Sig Dispense Refill  ? chlorthalidone (HYGROTON) 25 MG tablet Take 1 tablet (25 mg total) by mouth daily. 30 tablet 11  ? HYDROcodone-acetaminophen (NORCO/VICODIN) 5-325 MG tablet Take 1 tablet by mouth every 6 (six) hours as needed for severe pain. 60 tablet 0  ? Semaglutide-Weight Management 0.25 MG/0.5ML SOAJ Inject 0.25 mg into the skin once a week. 2 mL 0  ? albuterol (PROVENTIL  HFA;VENTOLIN HFA) 108 (90 Base) MCG/ACT inhaler Inhale 1-2 puffs every 6 (six) hours as needed into the lungs for wheezing or shortness of breath. 1 Inhaler 0  ? APPLE CIDER VINEGAR PO Take 1 tablet by mouth daily. Gummy Goli    ? atorvastatin (LIPITOR) 40 MG tablet Take 1 tablet (40 mg total) by mouth daily. 90 tablet 3  ? cholecalciferol (VITAMIN D3) 25 MCG (1000 UNIT) tablet Take 1,000 Units by mouth daily.    ? diclofenac Sodium (VOLTAREN) 1 % GEL Apply 4 g topically every 6 (six) hours as needed (arthritis pain). 150 g 0  ? levothyroxine (SYNTHROID) 50 MCG tablet Take 1 tablet (50 mcg total) by mouth daily. 90 tablet 3  ? losartan (COZAAR) 100 MG tablet Take 1 tablet (100 mg total) by mouth daily. 90 tablet 3  ? meloxicam (MOBIC) 7.5 MG tablet Take 1 tablet (7.5 mg total) by mouth daily as needed for pain (severe pain). 10 tablet 0  ? metoprolol succinate (TOPROL-XL) 50 MG 24 hr tablet Take 1 tablet (50 mg total) by mouth daily. Take with or immediately following a meal. 90 tablet 3  ? topiramate (TOPAMAX) 25 MG tablet Take 1 tablet (25 mg total) by mouth at bedtime. 30 tablet 11  ? ?No current facility-administered medications for this visit.  ? ?Family History  ?Problem Relation Age of Onset  ? Hypertension Mother   ? Cancer Mother   ? Cancer Father   ? Cancer Sister   ? Ovarian cancer  Other   ?     family history  ? Breast cancer Other   ?     family history in 1st degree relatives.  ? Thyroid disease Sister   ? Colon cancer Neg Hx   ? Stomach cancer Neg Hx   ? ?Social History  ? ?Socioeconomic History  ? Marital status: Married  ?  Spouse name: Not on file  ? Number of children: Not on file  ? Years of education: Not on file  ? Highest education level: Not on file  ?Occupational History  ? Occupation: house wife  ?  Employer: UNEMPLOYED  ?Tobacco Use  ? Smoking status: Never  ? Smokeless tobacco: Never  ?Substance and Sexual Activity  ? Alcohol use: No  ?  Alcohol/week: 0.0 standard drinks  ? Drug use: No   ? Sexual activity: Not on file  ?Other Topics Concern  ? Not on file  ?Social History Narrative  ? Her daughter [name]) often comes with her to visits. She also has a grand daughter and great grand daughter(whose name is Nancy [name])  ? ?Social Determinants of Health  ? ?Financial Resource Strain: Not on file  ?Food Insecurity: Not on file  ?Transportation Needs: Not on file  ?Physical Activity: Not on file  ?Stress: Not on file  ?Social Connections: Not on file  ? ?Objective:  ?Physical Exam: ?Vitals:  ? 06/23/21 1003 06/23/21 1047  ?BP: (!) 187/89 (!) 196/86  ?Pulse: 70 63  ?Temp: 97.8 ?F (36.6 ?C)   ?TempSrc: Oral   ?SpO2: 96%   ?Weight: 251 lb 9.6 oz (114.1 kg)   ?Height: 5\' 3"  (1.6 m)   ? ?General appearance: alert, cooperative, appears stated age, and no distress ?Extremities: pain to palpation over anterior left knee and bilateral joint line, no significant effusion noted; landmarks difficult to identify due to body habitus ?Assessment & Plan:  ? ?See Encounters tab for problem-based charting. ? ?[name] was seen today for follow-up and medication refill. ? ?Diagnoses and all orders for this visit: ? ?Hyperlipidemia, unspecified hyperlipidemia type ?-     Lipid Profile ? ?Essential hypertension ?-     Discontinue: losartan (COZAAR) 100 MG tablet; Take 1 tablet (100 mg total) by mouth daily. ?-     BMP8+Anion Gap ?-     chlorthalidone (HYGROTON) 25 MG tablet; Take 1 tablet (25 mg total) by mouth daily. ?-     losartan (COZAAR) 100 MG tablet; Take 1 tablet (100 mg total) by mouth daily. ? ?Arthralgia of left lower leg ?-     HYDROcodone-acetaminophen (NORCO/VICODIN) 5-325 MG tablet; Take 1 tablet by mouth every 6 (six) hours as needed for severe pain. ?-     AMB referral to orthopedics ?-     ToxAssure Select,+Antidepr,UR ? ?Morbid obesity with body mass index (BMI) of 40.0 to 44.9 in adult [facility]) ?-     Semaglutide-Weight Management 0.25 MG/0.5ML SOAJ; Inject 0.25 mg into the skin once a week. ? ?  ?Orders Placed  This Encounter  ?Procedures  ? Lipid Profile  ? BMP8+Anion Gap  ? ToxAssure Select,+Antidepr,UR  ? AMB referral to orthopedics  ?  Referral Priority:   Routine  ?  Referral Type:   Consultation  ?  Number of Visits Requested:   1  ? ?Meds ordered this encounter  ?Medications  ? DISCONTD: losartan (COZAAR) 100 MG tablet  ?  Sig: Take 1 tablet (100 mg total) by mouth daily.  ?  Dispense:  90 tablet  ?  Refill:  3  ? HYDROcodone-acetaminophen (NORCO/VICODIN) 5-325 MG tablet  ?  Sig: Take 1 tablet by mouth every 6 (six) hours as needed for severe pain.  ?  Dispense:  60 tablet  ?  Refill:  0  ? chlorthalidone (HYGROTON) 25 MG tablet  ?  Sig: Take 1 tablet (25 mg total) by mouth daily.  ?  Dispense:  30 tablet  ?  Refill:  11  ? Semaglutide-Weight Management 0.25 MG/0.5ML SOAJ  ?  Sig: Inject 0.25 mg into the skin once a week.  ?  Dispense:  2 mL  ?  Refill:  0  ?  Please send prior authorization if not covered by patient's insurance.  ? losartan (COZAAR) 100 MG tablet  ?  Sig: Take 1 tablet (100 mg total) by mouth daily.  ?  Dispense:  90 tablet  ?  Refill:  3  ? ?Return in about 4 weeks (around 07/21/2021) for f/u BP. ? ?Charise Killian, MD ? ?

## 2021-06-23 NOTE — Assessment & Plan Note (Signed)
UTD on mammogram, normal. Discussed obtaining Shingrix and Tdap at patient's pharmacy. ?

## 2021-06-23 NOTE — Assessment & Plan Note (Signed)
Blood pressure elevated, no improvement on recheck. Nancy [name] reports her first pressure was likely due to knee pain, however she felt better during the second reading which actually increased. She reports she has been taking her medications daily, including this morning before today's appointment. Her home readings continue to be 130-140s per patient report, no copy for review. Given significant elevation in clinic today, as well as home pressures remaining above SBP goal <130, we discussed addition of chlorthalidone to patient's regimen. We will recheck Bmp today given uptitration in losartan since last visit, plan for f/u in one month for repeat check. ?-Start chlorthalidone 25 mg daily ?-Continue losartan 100 mg daily, metoprolol XL 50 mg ?-Continue home monitoring ?

## 2021-06-24 ENCOUNTER — Ambulatory Visit: Payer: Medicare PPO | Admitting: Rheumatology

## 2021-06-24 LAB — LIPID PANEL
Chol/HDL Ratio: 3.2 ratio (ref 0.0–4.4)
Cholesterol, Total: 200 mg/dL — ABNORMAL HIGH (ref 100–199)
HDL: 63 mg/dL
LDL Chol Calc (NIH): 119 mg/dL — ABNORMAL HIGH (ref 0–99)
Triglycerides: 101 mg/dL (ref 0–149)
VLDL Cholesterol Cal: 18 mg/dL (ref 5–40)

## 2021-06-24 LAB — BMP8+ANION GAP
Anion Gap: 13 mmol/L (ref 10.0–18.0)
BUN/Creatinine Ratio: 13 (ref 12–28)
BUN: 12 mg/dL (ref 8–27)
CO2: 24 mmol/L (ref 20–29)
Calcium: 9.1 mg/dL (ref 8.7–10.3)
Chloride: 100 mmol/L (ref 96–106)
Creatinine, Ser: 0.92 mg/dL (ref 0.57–1.00)
Glucose: 134 mg/dL — ABNORMAL HIGH (ref 70–99)
Potassium: 4.4 mmol/L (ref 3.5–5.2)
Sodium: 137 mmol/L (ref 134–144)
eGFR: 68 mL/min/{1.73_m2}

## 2021-06-26 ENCOUNTER — Other Ambulatory Visit: Payer: Self-pay | Admitting: Internal Medicine

## 2021-06-26 DIAGNOSIS — E785 Hyperlipidemia, unspecified: Secondary | ICD-10-CM

## 2021-06-26 MED ORDER — ATORVASTATIN CALCIUM 80 MG PO TABS: 80.0000 mg | ORAL_TABLET | Freq: Every day | ORAL | 3 refills | Status: DC

## 2021-06-26 NOTE — Progress Notes (Signed)
Stable electrolytes, kidney function. Discussed with Ms. Bergeron via telephone 06/26/21.

## 2021-06-26 NOTE — Progress Notes (Signed)
Cholesterol remains elevated, <50% reduction in LDL with atorvastatin 40 mg daily. Discussed increase to 80 mg with Ms. Miron via telephone 06/26/21 and she is agreeable. Recheck lipid panel in 4-12 weeks.

## 2021-06-28 LAB — TOXASSURE SELECT,+ANTIDEPR,UR

## 2021-07-02 ENCOUNTER — Ambulatory Visit (INDEPENDENT_AMBULATORY_CARE_PROVIDER_SITE_OTHER): Payer: Medicare PPO | Admitting: Orthopaedic Surgery

## 2021-07-02 VITALS — Ht 63.0 in | Wt 251.6 lb

## 2021-07-02 DIAGNOSIS — M25562 Pain in left knee: Secondary | ICD-10-CM | POA: Diagnosis not present

## 2021-07-02 DIAGNOSIS — G8929 Other chronic pain: Secondary | ICD-10-CM | POA: Diagnosis not present

## 2021-07-02 DIAGNOSIS — M1712 Unilateral primary osteoarthritis, left knee: Secondary | ICD-10-CM | POA: Diagnosis not present

## 2021-07-02 MED ORDER — LIDOCAINE HCL 1 % IJ SOLN
3.0000 mL | INTRAMUSCULAR | Status: AC | PRN
  Administered 2021-07-02: 3 mL

## 2021-07-02 MED ORDER — METHYLPREDNISOLONE ACETATE 40 MG/ML IJ SUSP
40.0000 mg | INTRAMUSCULAR | Status: AC | PRN
  Administered 2021-07-02: 40 mg via INTRA_ARTICULAR

## 2021-07-02 NOTE — Progress Notes (Signed)
? ?Office Visit Note ?  ?Patient: Nancy [name]           ?Date of Birth: 06/22/1955           ?MRN: [id number] ?Visit Date: 07/02/2021 ?             ?Requested by: Dickie LaLau, Grace, MD ?[address] ?[place],  [place] [id number] ?PCP: Dickie LaLau, Grace, MD ? ? ?Assessment & Plan: ?Visit Diagnoses:  ?1. Chronic pain of left knee   ?2. Unilateral primary osteoarthritis, left knee   ? ? ?Plan: I did recommend a steroid injection at least in her left knee today to see if this would help temporize her pain.  She may be a candidate later for hyaluronic acid.  I would like to try outpatient physical therapy to see if this could work on strengthening her knee and any conditioning for her getting around better and mobilizing better with hopefully decreasing her pain.  Given her weight and high BMI, she is not a candidate for knee replacement.  She did did agree to the steroid injection today in her left knee.  We will see her back in 6 weeks to see how the injection and therapy is done.  I did give her handout on hyaluronic acid. ? ?Follow-Up Instructions: Return in about 6 weeks (around 08/13/2021).  ? ?Orders:  ?Orders Placed This Encounter  ?Procedures  ? Large Joint Inj  ? ?No orders of the defined types were placed in this encounter. ? ? ? ? Procedures: ?Large Joint Inj: L knee on 07/02/2021 5:01 PM ?Indications: diagnostic evaluation and pain ?Details: 22 G 1.5 in needle, superolateral approach ? ?Arthrogram: No ? ?Medications: 3 mL lidocaine 1 %; 40 mg methylPREDNISolone acetate 40 MG/ML ?Outcome: tolerated well, no immediate complications ?Procedure, treatment alternatives, risks and benefits explained, specific risks discussed. Consent was given by the patient. Immediately prior to procedure a time out was called to verify the correct patient, procedure, equipment, support staff and site/side marked as required. Patient was prepped and draped in the usual sterile fashion.  ? ? ? ? ?Clinical Data: ?No additional  findings. ? ? ?Subjective: ?Chief Complaint  ?Patient presents with  ? Left Knee - Pain  ?The patient is a 66 year old female I am seeing for the first time.  She was referred to us to evaluate and treat left knee pain has been getting worse for several weeks now with no known injury.  She says is very painful to walk.  X-rays of her left knee in February were reviewed and does show tricompartment arthritis of that knee.  She is also had pain in all the joints of both hands and fingers.  She has a hard time really getting around much at all.  Her BMI is almost 45.  She is trying to work on weight loss.  She has never had surgery on her left knee and has not had any type of injection in that knee. ? ?HPI ? ?Review of Systems ?Today there is no listed headache, chest pain, shortness of breath, fever, chills, nausea, vomiting ? ?Objective: ?Vital Signs: Ht 5\' 3"  (1.6 m)   Wt 251 lb 9.6 oz (114.1 kg)   BMI 44.57 kg/m?  ? ?Physical Exam ?She is alert and orient x3 and in no acute distress ?Ortho Exam ?Examination of her left knee shows a lot of guarding with attempts of motion motion a lot of pain overall.  There is a large soft tissue envelope around the  knee. ?Specialty Comments:  ?No specialty comments available. ? ?Imaging: ?No results found. ?X-rays were reviewed of the left knee and it does show tricompartment arthritis. ? ?PMFS History: ?Patient Active Problem List  ? Diagnosis Date Noted  ? Hyperlipidemia 04/28/2021  ? Nocturia 12/16/2020  ? Plantar fasciitis of left foot 12/16/2020  ? Asymmetrical sensorineural hearing loss 08/09/2019  ? Wheelchair dependence 12/17/2016  ? Polyarthralgia 12/02/2016  ? Healthcare maintenance 12/18/2014  ? GERD (gastroesophageal reflux disease) 04/13/2014  ? Allergic rhinitis 01/24/2014  ? Morbid obesity with body mass index (BMI) of 40.0 to 44.9 in adult [facility]) 09/14/2013  ? Abnormal CT of brain 11/19/2011  ? MACULAR DEGENERATION, BILATERAL 05/22/2008  ? Essential hypertension  03/17/2006  ? SICCA SYNDROME 03/17/2006  ? KNEE PAIN, CHRONIC 03/17/2006  ? Chronic low back pain 03/17/2006  ? Obstructive sleep apnea 09/07/2003  ? Hypothyroidism 09/07/1998  ? Microcytic anemia 09/07/1998  ? ?Past Medical History:  ?Diagnosis Date  ? Acute cholecystitis 01/21/2021  ? AKI (acute kidney injury) ([facility]) 12/04/2018  ? Back pain   ? chronic low back pain L4-5 discectomy:11/99. degenerative thoracic spondylotic changes 4/07  ? Chest pain   ? neg adenosine myoview.  ? Hypertension   ? no LVH EKG-7/05, nl M/C ratio 4/07  ? Hypothyroidism   ? 09/1998  ? Lower extremity edema   ? echo EF 55-65% w/o evidence of Dias dysfx.,   ? Menorrhagia   ? Microcytic anemia   ? 09/1998. Hb-10.5/MCV 76. needs ferritin to determine ACD vs IDA  ? Morbid obesity ([facility])   ? Obstructive sleep apnea   ? severe 7/05 RD 161 per hr. /CPAP 18cwp  ? Pneumonia due to COVID-19 virus 12/04/2018  ? Polyarthralgia   ? (knee/back/ankle) w/dx of fibromyalgia? given by [facility] rheum, knee pain chronic 2/2 obesity, fibromyalgia and Sjogren's.  ? Primary Sjogren's syndrome ([facility])   ? anti Ro+;ANA>1:1280 in homogen pattern, negative ds DNA/RF/anti Smith/RNP/C3-4 comp/la/jo1/Scleroderma/centromere, neg HIV/ACE/Hep B/C, nl CXR 2/05, schirmer salivary glandtest not done, symptom rx:eye drops/prednisone/plaquenil referral to [facility] rheum, [name] 3/07. saw [name] but stopped 2/2 cost.  ? Uterine bleeding   ? Viral URI with cough 11/03/2017  ?  ?Family History  ?Problem Relation Age of Onset  ? Hypertension Mother   ? Cancer Mother   ? Cancer Father   ? Cancer Sister   ? Ovarian cancer Other   ?     family history  ? Breast cancer Other   ?     family history in 1st degree relatives.  ? Thyroid disease Sister   ? Colon cancer Neg Hx   ? Stomach cancer Neg Hx   ?  ?Past Surgical History:  ?Procedure Laterality Date  ? CHOLECYSTECTOMY N/A 01/23/2021  ? Procedure: LAPAROSCOPIC CHOLECYSTECTOMY;  Surgeon: Violeta Gelinas, MD;  Location: [facility]  OR;  Service: General;  Laterality: N/A;  ? EXAMINATION UNDER ANESTHESIA  11/23/2011  ? Procedure: EXAM UNDER ANESTHESIA;  Surgeon: Tereso Newcomer, MD;  Location: [facility] ORS;  Service: Gynecology;  Laterality: N/A;  ? HYSTEROSCOPY WITH D & C  11/23/2011  ? Procedure: DILATATION AND CURETTAGE /HYSTEROSCOPY;  Surgeon: Tereso Newcomer, MD;  Location: [facility] ORS;  Service: Gynecology;  Laterality: N/A;  ? LUMBAR FUSION  1999  ? SHOULDER SURGERY  unk  ? TONSILLECTOMY    ? TUBAL LIGATION  1977  ? WISDOM TOOTH EXTRACTION    ? ?Social History  ? ?Occupational History  ? Occupation: house wife  ?  Employer:  UNEMPLOYED  ?Tobacco Use  ? Smoking status: Never  ? Smokeless tobacco: Never  ?Substance and Sexual Activity  ? Alcohol use: No  ?  Alcohol/week: 0.0 standard drinks  ? Drug use: No  ? Sexual activity: Not on file  ? ? ? ? ? ? ?

## 2021-07-03 ENCOUNTER — Other Ambulatory Visit: Payer: Self-pay

## 2021-07-03 DIAGNOSIS — G8929 Other chronic pain: Secondary | ICD-10-CM

## 2021-07-09 NOTE — Progress Notes (Signed)
 "  Office Visit Note  Patient: Nancy [name]             Date of Birth: 25-Jul-1955           MRN: [id number]             PCP: Karna Fellows, MD Referring: Karna Fellows, MD Visit Date: 07/10/2021   Subjective:   History of Present Illness: Nancy [name] is a 66 y.o. female here for evaluation of joint pain in multiple areas and abnormal labs concerning for inflammatory arthritis. She apparently had previous rheumatology assessment with [facility] but not on long term DMARD treatment. Bilateral hand pain affecting her knuckles she sees swelling of MCP joints or PIP joints intermittently. On other days not much morning stiffness and pain. She has ongoing knee pain worst on the left side. She saw Dr. Vernetta with intraarticular steroid injection a week ago and she had improvement but still a lot of active joint pain. Mobility is limited using a walker or more often wheelchair due to pain in her knees while weightbearing. She takes tylenol  or occasionally hydrocodone  for pain.   Labs reviewed ANA neg SSA >8.0 SSB neg RF 75.1 CCP >250 ESR 38  Activities of Daily Living:  Patient reports morning stiffness for several hours.   Patient Reports nocturnal pain.  Difficulty dressing/grooming: Reports Difficulty climbing stairs: Reports Difficulty getting out of chair: Reports Difficulty using hands for taps, buttons, cutlery, and/or writing: Reports  Review of Systems  Constitutional:  Positive for fatigue.  HENT:  Negative for mouth sores, mouth dryness and nose dryness.   Eyes:  Negative for pain, itching and dryness.  Respiratory:  Positive for cough and shortness of breath. Negative for difficulty breathing.   Cardiovascular:  Negative for chest pain and palpitations.  Gastrointestinal:  Negative for blood in stool, constipation and diarrhea.  Endocrine: Negative for increased urination.  Genitourinary:  Negative for difficulty urinating.  Musculoskeletal:  Positive for joint pain, joint  pain, joint swelling and morning stiffness. Negative for myalgias, muscle tenderness and myalgias.  Skin:  Negative for color change, rash and redness.  Allergic/Immunologic: Negative for susceptible to infections.  Neurological:  Positive for weakness. Negative for dizziness, numbness, headaches and memory loss.  Hematological:  Negative for bruising/bleeding tendency.  Psychiatric/Behavioral:  Negative for confusion.    PMFS History:  Patient Active Problem List   Diagnosis Date Noted   Bilateral hand pain 07/10/2021   Rheumatoid factor positive 07/10/2021   High risk medication use 07/10/2021   Hyperlipidemia 04/28/2021   Nocturia 12/16/2020   Plantar fasciitis of left foot 12/16/2020   Asymmetrical sensorineural hearing loss 08/09/2019   Wheelchair dependence 12/17/2016   Polyarthralgia 12/02/2016   Healthcare maintenance 12/18/2014   GERD (gastroesophageal reflux disease) 04/13/2014   Allergic rhinitis 01/24/2014   Morbid obesity with body mass index (BMI) of 40.0 to 44.9 in adult [facility]) 09/14/2013   Abnormal CT of brain 11/19/2011   MACULAR DEGENERATION, BILATERAL 05/22/2008   Essential hypertension 03/17/2006   SICCA SYNDROME 03/17/2006   KNEE PAIN, CHRONIC 03/17/2006   Chronic low back pain 03/17/2006   Obstructive sleep apnea 09/07/2003   Hypothyroidism 09/07/1998   Microcytic anemia 09/07/1998    Past Medical History:  Diagnosis Date   Acute cholecystitis 01/21/2021   AKI (acute kidney injury) (HCC) 12/04/2018   Back pain    chronic low back pain L4-5 discectomy:11/99. degenerative thoracic spondylotic changes 4/07   Chest pain    neg adenosine myoview.  Hypertension    no LVH EKG-7/05, nl M/C ratio 4/07   Hypothyroidism    09/1998   Lower extremity edema    echo EF 55-65% w/o evidence of Dias dysfx.,    Menorrhagia    Microcytic anemia    09/1998. Hb-10.5/MCV 76. needs ferritin to determine ACD vs IDA   Morbid obesity (HCC)    Obstructive sleep apnea     severe 7/05 RD 161 per hr. /CPAP 18cwp   Pneumonia due to COVID-19 virus 12/04/2018   Polyarthralgia    (knee/back/ankle) w/dx of fibromyalgia? given by [facility] rheum, knee pain chronic 2/2 obesity, fibromyalgia and Sjogren's.   Primary Sjogren's syndrome (HCC)    anti Ro+;ANA>1:1280 in homogen pattern, negative ds DNA/RF/anti Smith/RNP/C3-4 comp/la/jo1/Scleroderma/centromere, neg HIV/ACE/Hep B/C, nl CXR 2/05, schirmer salivary glandtest not done, symptom rx:eye drops/prednisone /plaquenil  referral to [facility] rheum, Dr. Chauncy 3/07. saw [name] but stopped 2/2 cost.   Uterine bleeding    Viral URI with cough 11/03/2017    Family History  Problem Relation Age of Onset   Hypertension Mother    Cancer Mother    Cancer Father    Cancer Sister    Ovarian cancer Other        family history   Breast cancer Other        family history in 1st degree relatives.   Thyroid  disease Sister    Colon cancer Neg Hx    Stomach cancer Neg Hx    Past Surgical History:  Procedure Laterality Date   CHOLECYSTECTOMY N/A 01/23/2021   Procedure: LAPAROSCOPIC CHOLECYSTECTOMY;  Surgeon: Sebastian Moles, MD;  Location: [facility] OR;  Service: General;  Laterality: N/A;   EXAMINATION UNDER ANESTHESIA  11/23/2011   Procedure: EXAM UNDER ANESTHESIA;  Surgeon: Gloris DELENA Hugger, MD;  Location: [facility] ORS;  Service: Gynecology;  Laterality: N/A;   HYSTEROSCOPY WITH D & C  11/23/2011   Procedure: DILATATION AND CURETTAGE /HYSTEROSCOPY;  Surgeon: Gloris DELENA Hugger, MD;  Location: [facility] ORS;  Service: Gynecology;  Laterality: N/A;   LUMBAR FUSION  1999   SHOULDER SURGERY  unk   TONSILLECTOMY     TUBAL LIGATION  1977   WISDOM TOOTH EXTRACTION     Social History   Social History Narrative   Her daughter judi) often comes with her to visits. She also has a grand daughter and great grand daughter(whose name is Land)   Immunization History  Administered Date(s) Administered   Fluad Quad(high Dose 65+) 12/08/2018,  12/16/2020   Moderna Covid-19 Vaccine Bivalent Booster 87yrs & up 12/28/2020   PFIZER(Purple Top)SARS-COV-2 Vaccination 05/18/2019, 06/12/2019, 12/16/2019   PNEUMOCOCCAL CONJUGATE-20 12/16/2020   Pneumococcal Polysaccharide-23 12/08/2018   Td 04/29/2009, 09/13/2009     Objective: Vital Signs: BP (!) 180/102 (BP Location: Right Arm, Patient Position: Sitting, Cuff Size: Large)   Pulse 72   Ht 5' 3 (1.6 m)   Wt 247 lb 6.4 oz (112.2 kg)   BMI 43.82 kg/m    Physical Exam Constitutional:      Appearance: She is obese.  Eyes:     Conjunctiva/sclera: Conjunctivae normal.  Cardiovascular:     Rate and Rhythm: Normal rate and regular rhythm.  Pulmonary:     Effort: Pulmonary effort is normal.     Breath sounds: Normal breath sounds.  Musculoskeletal:     Right lower leg: No edema.     Left lower leg: No edema.  Skin:    General: Skin is warm and dry.  Neurological:     Mental  Status: She is alert.  Psychiatric:        Mood and Affect: Mood normal.     Musculoskeletal Exam:  Shoulders full ROM bilateral tenderness no swelling Elbows full ROM no tenderness or swelling Wrists full ROM no tenderness or swelling Fingers full ROM no tenderness or swelling, chronic appearing MCP joint changes with soft tissue thickening on dorsal side Knees full ROM left knee severely tender to pressure on medial side, minimally tender on others and no obvious effusion Ankles full ROM no tenderness or swelling   CDAI Exam: CDAI Score: 9  Patient Global: 40 mm; Provider Global: 20 mm Swollen: 0 ; Tender: 3  Joint Exam 07/10/2021      Right  Left  Glenohumeral   Tender   Tender  Knee      Tender     Investigation: No additional findings.  Imaging: MM 3D SCREEN BREAST BILATERAL  Result Date: 06/16/2021 CLINICAL DATA:  Screening. EXAM: DIGITAL SCREENING BILATERAL MAMMOGRAM WITH TOMOSYNTHESIS AND CAD TECHNIQUE: Bilateral screening digital craniocaudal and mediolateral oblique mammograms  were obtained. Bilateral screening digital breast tomosynthesis was performed. The images were evaluated with computer-aided detection. COMPARISON:  Previous exam(s). ACR Breast Density Category b: There are scattered areas of fibroglandular density. FINDINGS: There are no findings suspicious for malignancy. IMPRESSION: No mammographic evidence of malignancy. A result letter of this screening mammogram will be mailed directly to the patient. RECOMMENDATION: Screening mammogram in one year. (Code:SM-B-01Y) BI-RADS CATEGORY  1: Negative. Electronically Signed   By: Craig Farr M.D.   On: 06/16/2021 11:23   XR Hand 2 View Left  Result Date: 07/10/2021 X-ray left hand 2 views Radiocarpal joint space appears normal.  Mild carpal bone degenerative changes with few subchondral cyst.  Thumb osteoarthritis changes more advanced distally.  MCP joint spaces look normal could have some juxta articular osteopenia.  Mild DIP joint degenerative changes.  Bone mineralization otherwise appears normal and no erosions seen. Impression Mild appearing osteoarthritis changes, possible proximal finger joint juxta-articular osteopenia could be consistent with chronic inflammatory arthritis but no erosive disease  XR Hand 2 View Right  Result Date: 07/10/2021 X-ray right hand 2 views Radiocarpal joint space appears normal.  Carpal bones mostly normal few cystic changes are present.  Moderate degenerative appearing changes in first MCP and IP joints.  Small lateral osteophyte or enthesophyte third MCP joint subchondral cyst in metacarpal heads.  Mild asymmetric joint space narrowing of DIPs.  Possible juxta articular osteopenia in proximal finger joints bone mineralization otherwise appears normal and no erosions are present. Impression Mostly mild appearing osteoarthritis somewhat worse in the thumb, possible juxta-articular osteopenia could be consistent with longstanding inflammatory arthritis but no erosive disease   Recent  Labs: Lab Results  Component Value Date   WBC 5.3 07/10/2021   HGB 11.0 (L) 07/10/2021   PLT 268 07/10/2021   NA 137 06/23/2021   K 4.4 06/23/2021   CL 100 06/23/2021   CO2 24 06/23/2021   GLUCOSE 134 (H) 06/23/2021   BUN 12 06/23/2021   CREATININE 0.92 06/23/2021   BILITOT 0.6 01/24/2021   ALKPHOS 53 01/24/2021   AST 30 01/24/2021   ALT 16 01/24/2021   PROT 7.3 01/24/2021   ALBUMIN 2.8 (L) 01/24/2021   CALCIUM  9.1 06/23/2021   GFRAA >60 09/06/2019    Speciality Comments: No specialty comments available.  Procedures:  No procedures performed Allergies: Amlodipine , Coconut fatty acids, and Naproxen sodium   Assessment / Plan:     Visit  Diagnoses: Rheumatoid factor positive - Plan: Sedimentation rate Bilateral hand pain - Plan: XR Hand 2 View Right, XR Hand 2 View Left  Positive RA serology which she has had for several years there is no overt joint synovitis appreciated on physical exam today.  We will also check sedimentation rate for inflammatory monitoring.  Bilateral hand x-rays checked are somewhat nonspecific she does have mild osteoarthritis changes but could be consistent with a nonerosive RA.  Alternatively rheumatoid factor can be positive in association with Sjogren syndrome where nondamaging arthralgia would be typical.  I have a low threshold to recommend trial of specific DMARD treatment.  Consider hydroxychloroquine  versus methotrexate.  History of macular degeneration so would need ophthalmology input if planning Plaquenil .  Getting baseline lab screening today as well.  SICCA SYNDROME  Chronic dry eyes with multiple positive serology considering starting systemic treatment as above.  Currently topical management just keeping on it with drops and artificial tears as needed.  Polyarthralgia Wheelchair dependence  Worst limitation right now is from the left knee pain limiting her mobility.  I do not see obvious synovitis on exam but she also just had a recent  injection a week ago.  Already established with Dr. Vernetta and has scheduled follow-up in a few weeks.  Review of her previous x-ray certainly shows osteoarthritis worst in the medial joint compartment though her pain on exam seems pretty acute relative to these findings.  Suggestive for additional inflammatory component or may be chronic pain syndrome/sensitization.  High risk medication use - Plan: CBC with Differential/Platelet, COMPLETE METABOLIC PANEL WITH GFR, Hepatitis B core antibody, IgM, Hepatitis B surface antigen  Discussed potential for trial of methotrexate checking baseline labs CBC, CMP, and hepatitis B serology.  Previous hepatitis C checked within the past few years negative.  Orders: Orders Placed This Encounter  Procedures   XR Hand 2 View Right   XR Hand 2 View Left   CBC with Differential/Platelet   COMPLETE METABOLIC PANEL WITH GFR   Hepatitis B core antibody, IgM   Hepatitis B surface antigen   Sedimentation rate   No orders of the defined types were placed in this encounter.    Follow-Up Instructions: Return in about 2 weeks (around 07/24/2021) for New pt ?pSS/?RA f/u 2wks.   Nancy LELON Ester, MD  Note - This record has been created using [employer].  Chart creation errors have been sought, but may not always  have been located. Such creation errors do not reflect on  the standard of medical care.  "

## 2021-07-10 ENCOUNTER — Ambulatory Visit (INDEPENDENT_AMBULATORY_CARE_PROVIDER_SITE_OTHER): Payer: Medicare PPO | Admitting: Internal Medicine

## 2021-07-10 ENCOUNTER — Ambulatory Visit (INDEPENDENT_AMBULATORY_CARE_PROVIDER_SITE_OTHER): Payer: Medicare PPO

## 2021-07-10 ENCOUNTER — Encounter: Payer: Self-pay | Admitting: Internal Medicine

## 2021-07-10 VITALS — BP 180/102 | HR 72 | Ht 63.0 in | Wt 247.4 lb

## 2021-07-10 DIAGNOSIS — M2550 Pain in unspecified joint: Secondary | ICD-10-CM | POA: Diagnosis not present

## 2021-07-10 DIAGNOSIS — Z79899 Other long term (current) drug therapy: Secondary | ICD-10-CM | POA: Insufficient documentation

## 2021-07-10 DIAGNOSIS — M3500 Sicca syndrome, unspecified: Secondary | ICD-10-CM | POA: Diagnosis not present

## 2021-07-10 DIAGNOSIS — Z993 Dependence on wheelchair: Secondary | ICD-10-CM

## 2021-07-10 DIAGNOSIS — M79641 Pain in right hand: Secondary | ICD-10-CM | POA: Diagnosis not present

## 2021-07-10 DIAGNOSIS — R768 Other specified abnormal immunological findings in serum: Secondary | ICD-10-CM

## 2021-07-10 DIAGNOSIS — R7689 Other specified abnormal immunological findings in serum: Secondary | ICD-10-CM | POA: Insufficient documentation

## 2021-07-10 DIAGNOSIS — M79642 Pain in left hand: Secondary | ICD-10-CM

## 2021-07-11 LAB — CBC WITH DIFFERENTIAL/PLATELET
Absolute Monocytes: 562 {cells}/uL (ref 200–950)
Basophils Absolute: 42 {cells}/uL (ref 0–200)
Basophils Relative: 0.8 %
Eosinophils Absolute: 122 {cells}/uL (ref 15–500)
Eosinophils Relative: 2.3 %
HCT: 37.1 % (ref 35.0–45.0)
Hemoglobin: 11 g/dL — ABNORMAL LOW (ref 11.7–15.5)
Lymphs Abs: 2168 {cells}/uL (ref 850–3900)
MCH: 22.4 pg — ABNORMAL LOW (ref 27.0–33.0)
MCHC: 29.6 g/dL — ABNORMAL LOW (ref 32.0–36.0)
MCV: 75.7 fL — ABNORMAL LOW (ref 80.0–100.0)
MPV: 11.9 fL (ref 7.5–12.5)
Monocytes Relative: 10.6 %
Neutro Abs: 2406 {cells}/uL (ref 1500–7800)
Neutrophils Relative %: 45.4 %
Platelets: 268 10*3/uL (ref 140–400)
RBC: 4.9 Million/uL (ref 3.80–5.10)
RDW: 14.6 % (ref 11.0–15.0)
Total Lymphocyte: 40.9 %
WBC: 5.3 10*3/uL (ref 3.8–10.8)

## 2021-07-11 LAB — COMPLETE METABOLIC PANEL WITHOUT GFR
AG Ratio: 1.1 (calc) (ref 1.0–2.5)
ALT: 8 U/L (ref 6–29)
AST: 14 U/L (ref 10–35)
Albumin: 3.8 g/dL (ref 3.6–5.1)
Alkaline phosphatase (APISO): 62 U/L (ref 37–153)
BUN: 15 mg/dL (ref 7–25)
CO2: 32 mmol/L (ref 20–32)
Calcium: 8.8 mg/dL (ref 8.6–10.4)
Chloride: 100 mmol/L (ref 98–110)
Creat: 0.85 mg/dL (ref 0.50–1.05)
Globulin: 3.5 g/dL (ref 1.9–3.7)
Glucose, Bld: 81 mg/dL (ref 65–99)
Potassium: 4.3 mmol/L (ref 3.5–5.3)
Sodium: 136 mmol/L (ref 135–146)
Total Bilirubin: 0.7 mg/dL (ref 0.2–1.2)
Total Protein: 7.3 g/dL (ref 6.1–8.1)
eGFR: 75 mL/min/{1.73_m2}

## 2021-07-11 LAB — HEPATITIS B SURFACE ANTIGEN: Hepatitis B Surface Ag: NONREACTIVE

## 2021-07-11 LAB — HEPATITIS B CORE ANTIBODY, IGM: Hep B C IgM: NONREACTIVE

## 2021-07-11 LAB — SEDIMENTATION RATE: Sed Rate: 36 mm/h — ABNORMAL HIGH (ref 0–30)

## 2021-07-15 ENCOUNTER — Ambulatory Visit: Payer: Medicare PPO | Admitting: Rheumatology

## 2021-07-16 NOTE — Progress Notes (Deleted)
 "  Office Visit Note  Patient: Nancy [name]             Date of Birth: Jan 23, 1956           MRN: [id number]             PCP: Karna Fellows, MD Referring: Karna Fellows, MD Visit Date: 07/30/2021   Subjective:  No chief complaint on file.   History of Present Illness: Nancy [name] is a 66 y.o. female here for follow up for joint pain in multiple areas and abnormal labs concerning for inflammatory arthritis.  Previous HPI 07/10/2021 Nancy [name] is a 66 y.o. female here for evaluation of joint pain in multiple areas and abnormal labs concerning for inflammatory arthritis. She apparently had previous rheumatology assessment with [facility] but not on long term DMARD treatment. Bilateral hand pain affecting her knuckles she sees swelling of MCP joints or PIP joints intermittently. On other days not much morning stiffness and pain. She has ongoing knee pain worst on the left side. She saw Dr. Vernetta with intraarticular steroid injection a week ago and she had improvement but still a lot of active joint pain. Mobility is limited using a walker or more often wheelchair due to pain in her knees while weightbearing. She takes tylenol  or occasionally hydrocodone  for pain.    Labs reviewed ANA neg SSA >8.0 SSB neg RF 75.1 CCP >250 ESR 38   No Rheumatology ROS completed.   PMFS History:  Patient Active Problem List   Diagnosis Date Noted   Bilateral hand pain 07/10/2021   Rheumatoid factor positive 07/10/2021   High risk medication use 07/10/2021   Hyperlipidemia 04/28/2021   Nocturia 12/16/2020   Plantar fasciitis of left foot 12/16/2020   Asymmetrical sensorineural hearing loss 08/09/2019   Wheelchair dependence 12/17/2016   Polyarthralgia 12/02/2016   Healthcare maintenance 12/18/2014   GERD (gastroesophageal reflux disease) 04/13/2014   Allergic rhinitis 01/24/2014   Morbid obesity with body mass index (BMI) of 40.0 to 44.9 in adult [facility]) 09/14/2013   Abnormal CT of brain  11/19/2011   MACULAR DEGENERATION, BILATERAL 05/22/2008   Essential hypertension 03/17/2006   SICCA SYNDROME 03/17/2006   KNEE PAIN, CHRONIC 03/17/2006   Chronic low back pain 03/17/2006   Obstructive sleep apnea 09/07/2003   Hypothyroidism 09/07/1998   Microcytic anemia 09/07/1998    Past Medical History:  Diagnosis Date   Acute cholecystitis 01/21/2021   AKI (acute kidney injury) (HCC) 12/04/2018   Back pain    chronic low back pain L4-5 discectomy:11/99. degenerative thoracic spondylotic changes 4/07   Chest pain    neg adenosine myoview.   Hypertension    no LVH EKG-7/05, nl M/C ratio 4/07   Hypothyroidism    09/1998   Lower extremity edema    echo EF 55-65% w/o evidence of Dias dysfx.,    Menorrhagia    Microcytic anemia    09/1998. Hb-10.5/MCV 76. needs ferritin to determine ACD vs IDA   Morbid obesity (HCC)    Obstructive sleep apnea    severe 7/05 RD 161 per hr. /CPAP 18cwp   Pneumonia due to COVID-19 virus 12/04/2018   Polyarthralgia    (knee/back/ankle) w/dx of fibromyalgia? given by [facility] rheum, knee pain chronic 2/2 obesity, fibromyalgia and Sjogren's.   Primary Sjogren's syndrome (HCC)    anti Ro+;ANA>1:1280 in homogen pattern, negative ds DNA/RF/anti Smith/RNP/C3-4 comp/la/jo1/Scleroderma/centromere, neg HIV/ACE/Hep B/C, nl CXR 2/05, schirmer salivary glandtest not done, symptom rx:eye drops/prednisone /plaquenil  referral to [facility] rheum,  Dr. Chauncy 3/07. saw [name] but stopped 2/2 cost.   Uterine bleeding    Viral URI with cough 11/03/2017    Family History  Problem Relation Age of Onset   Hypertension Mother    Cancer Mother    Cancer Father    Cancer Sister    Ovarian cancer Other        family history   Breast cancer Other        family history in 1st degree relatives.   Thyroid  disease Sister    Colon cancer Neg Hx    Stomach cancer Neg Hx    Past Surgical History:  Procedure Laterality Date   CHOLECYSTECTOMY N/A 01/23/2021    Procedure: LAPAROSCOPIC CHOLECYSTECTOMY;  Surgeon: Sebastian Moles, MD;  Location: [facility] OR;  Service: General;  Laterality: N/A;   EXAMINATION UNDER ANESTHESIA  11/23/2011   Procedure: EXAM UNDER ANESTHESIA;  Surgeon: Gloris DELENA Hugger, MD;  Location: [facility] ORS;  Service: Gynecology;  Laterality: N/A;   HYSTEROSCOPY WITH D & C  11/23/2011   Procedure: DILATATION AND CURETTAGE /HYSTEROSCOPY;  Surgeon: Gloris DELENA Hugger, MD;  Location: [facility] ORS;  Service: Gynecology;  Laterality: N/A;   LUMBAR FUSION  1999   SHOULDER SURGERY  unk   TONSILLECTOMY     TUBAL LIGATION  1977   WISDOM TOOTH EXTRACTION     Social History   Social History Narrative   Her daughter judi) often comes with her to visits. She also has a grand daughter and great grand daughter(whose name is Lynda)   Immunization History  Administered Date(s) Administered   Fluad Quad(high Dose 65+) 12/08/2018, 12/16/2020   Moderna Covid-19 Vaccine Bivalent Booster 15yrs & up 12/28/2020   PFIZER(Purple Top)SARS-COV-2 Vaccination 05/18/2019, 06/12/2019, 12/16/2019   PNEUMOCOCCAL CONJUGATE-20 12/16/2020   Pneumococcal Polysaccharide-23 12/08/2018   Td 04/29/2009, 09/13/2009     Objective: Vital Signs: There were no vitals taken for this visit.   Physical Exam   Musculoskeletal Exam: ***  CDAI Exam: CDAI Score: -- Patient Global: --; Provider Global: -- Swollen: --; Tender: -- Joint Exam 07/30/2021   No joint exam has been documented for this visit   There is currently no information documented on the homunculus. Go to the Rheumatology activity and complete the homunculus joint exam.  Investigation: No additional findings.  Imaging: XR Hand 2 View Left  Result Date: 07/10/2021 X-ray left hand 2 views Radiocarpal joint space appears normal.  Mild carpal bone degenerative changes with few subchondral cyst.  Thumb osteoarthritis changes more advanced distally.  MCP joint spaces look normal could have some juxta articular osteopenia.   Mild DIP joint degenerative changes.  Bone mineralization otherwise appears normal and no erosions seen. Impression Mild appearing osteoarthritis changes, possible proximal finger joint juxta-articular osteopenia could be consistent with chronic inflammatory arthritis but no erosive disease  XR Hand 2 View Right  Result Date: 07/10/2021 X-ray right hand 2 views Radiocarpal joint space appears normal.  Carpal bones mostly normal few cystic changes are present.  Moderate degenerative appearing changes in first MCP and IP joints.  Small lateral osteophyte or enthesophyte third MCP joint subchondral cyst in metacarpal heads.  Mild asymmetric joint space narrowing of DIPs.  Possible juxta articular osteopenia in proximal finger joints bone mineralization otherwise appears normal and no erosions are present. Impression Mostly mild appearing osteoarthritis somewhat worse in the thumb, possible juxta-articular osteopenia could be consistent with longstanding inflammatory arthritis but no erosive disease   Recent Labs: Lab Results  Component Value Date  WBC 5.3 07/10/2021   HGB 11.0 (L) 07/10/2021   PLT 268 07/10/2021   NA 136 07/10/2021   K 4.3 07/10/2021   CL 100 07/10/2021   CO2 32 07/10/2021   GLUCOSE 81 07/10/2021   BUN 15 07/10/2021   CREATININE 0.85 07/10/2021   BILITOT 0.7 07/10/2021   ALKPHOS 53 01/24/2021   AST 14 07/10/2021   ALT 8 07/10/2021   PROT 7.3 07/10/2021   ALBUMIN 2.8 (L) 01/24/2021   CALCIUM  8.8 07/10/2021   GFRAA >60 09/06/2019    Speciality Comments: No specialty comments available.  Procedures:  No procedures performed Allergies: Amlodipine , Coconut fatty acids, and Naproxen sodium   Assessment / Plan:     Visit Diagnoses: No diagnosis found.  ***  Orders: No orders of the defined types were placed in this encounter.  No orders of the defined types were placed in this encounter.    Follow-Up Instructions: No follow-ups on file.   Daved JAYSON Gavel,  CMA  Note - This record has been created using [employer].  Chart creation errors have been sought, but may not always  have been located. Such creation errors do not reflect on  the standard of medical care.  "

## 2021-07-21 ENCOUNTER — Other Ambulatory Visit (HOSPITAL_COMMUNITY): Payer: Self-pay

## 2021-07-21 ENCOUNTER — Other Ambulatory Visit: Payer: Self-pay

## 2021-07-21 ENCOUNTER — Encounter: Payer: Self-pay | Admitting: Internal Medicine

## 2021-07-21 ENCOUNTER — Ambulatory Visit (INDEPENDENT_AMBULATORY_CARE_PROVIDER_SITE_OTHER): Payer: Medicare PPO | Admitting: Internal Medicine

## 2021-07-21 VITALS — BP 168/82 | HR 71 | Temp 98.2°F | Ht 63.0 in | Wt 249.2 lb

## 2021-07-21 DIAGNOSIS — M2550 Pain in unspecified joint: Secondary | ICD-10-CM

## 2021-07-21 DIAGNOSIS — E785 Hyperlipidemia, unspecified: Secondary | ICD-10-CM

## 2021-07-21 DIAGNOSIS — Z6841 Body Mass Index (BMI) 40.0 and over, adult: Secondary | ICD-10-CM | POA: Diagnosis not present

## 2021-07-21 DIAGNOSIS — I10 Essential (primary) hypertension: Secondary | ICD-10-CM

## 2021-07-21 DIAGNOSIS — M25562 Pain in left knee: Secondary | ICD-10-CM | POA: Diagnosis not present

## 2021-07-21 MED ORDER — AMLODIPINE BESYLATE 5 MG PO TABS: 5.0000 mg | ORAL_TABLET | Freq: Every day | ORAL | 3 refills | Status: DC

## 2021-07-21 MED ORDER — AMLODIPINE BESYLATE 5 MG PO TABS
5.0000 mg | ORAL_TABLET | Freq: Every day | ORAL | 3 refills | Status: DC
  Filled 2021-07-21: qty 90, 90d supply, fill #0

## 2021-07-21 NOTE — Assessment & Plan Note (Signed)
Continuing to work on lifestyle modification. Nancy [name] was able to cut back on fried food to once/week, although notes she ate more of this over this past weekend. We reviewed health-promoting food options and she was provided with the ACLM "whole food, plant-based plate" handout for further guidance. I encouraged referral to nutrition for further counseling, however she declines at this time. At the end of our discussion, she seemed more open to this referral in the future. I will continue to offer this. She has not seen much benefit from topiramate, did not pick up [employer] and I did not receive PA. We discussed increasing topiramate dosing or trying semaglutide prescription again but she elects to try diet alone. Significant weight loss would likely greatly impact Nancy [name]'s quality of life, as well as risk factor modification for several chronic conditions including hypertension, hyperlipidemia, and chronic pain. ?

## 2021-07-21 NOTE — Assessment & Plan Note (Addendum)
Chronic left knee pain 2/2 OA +/- inflammatory arthritis. Nancy [name] reports significant improvement in left knee pain following steroid injection by Dr. Magnus Ivan 4/26. Not a candidate for knee replacement given current BMI. She is scheduled for first PT session tomorrow and is excited to learn about exercises to do, as well as possibly get back to the [facility] to do pool activity. Since injection, she has not needed to take hydrocodone. Will continue to update pain contract as indicated by course of pain. ?

## 2021-07-21 NOTE — Patient Instructions (Addendum)
Thank you, [name] for allowing [place] to provide your care today. Today we discussed blood pressure and weight.  ? ?Please ask your pharmacy about getting the tetanus (Tdap) and shingles (Shingrix) vaccine! ? ?I will call if your labs are abnormal. ? ?Please follow-up in 8 weeks. ? ?Should you have any questions or concerns please call the internal medicine clinic at [phone].   ? ?Dickie La, MD ?[facility] Internal Medicine ? ? ?My Chart Access: ?[email or web address] ? ? ?

## 2021-07-21 NOTE — Assessment & Plan Note (Signed)
>>  ASSESSMENT AND PLAN FOR POLYARTHRALGIA WRITTEN ON 07/21/2021 11:41 AM BY LAU, GRACE, MD  Established with rheumatology, Dr. Benjamine Mola, who is considering methotrexate therapy for inflammatory arthritis, RA vs Sjogren-related arthralgia. Nancy [name] was hesitant today as she feels she is taking too many medications. I encouraged her to strongly consider this medication if prescribed given likely benefit on pain as well as prevention of further joint damage. F/u with Dr. Benjamine Mola 5/24.

## 2021-07-21 NOTE — Assessment & Plan Note (Signed)
Blood pressure above goal <130/80. Nancy [name] is not having significant pain today and did take her medications before today's visit. She has not been monitoring her home pressures. Denies chest pain, SOB, lightheadedness/dizziness, syncope. She has noted her morning headaches are resolved with addition of chlorthalidone. Although BP improved (SBP 180 > 160) with addition of chlorthalidone at last visit, continued elevation above goal prompting addition of medication. Previous leg swelling noted with generic amlodipine, not Norvasc. We discussed trialing amlodipine 5 mg and monitoring for swelling.  ?-BMP today given addition of thiazide at last visit ?-Start amlodipine 5 mg, monitor for leg swelling ?-Continue losartan 100 mg daily, metoprolol XL 50 daily ?-Discussed lifestyle/dietary modifications ?-Return in 8 weeks ?

## 2021-07-21 NOTE — Assessment & Plan Note (Signed)
Atorvastatin increased to 80 mg given LDL response <50% at last check. Due for repeat lipid panel in 4-12 weeks, will check at f/u visit. ?

## 2021-07-21 NOTE — Progress Notes (Signed)
?Subjective:  ? ?Patient ID: Nancy [name] female   DOB: 1955/05/12 66 y.o.   MRN: [id number] ? ?HPI: ?Nancy [name] is a 66 y.o. person here for follow-up of blood pressure and chronic left knee pain. ? ?For details of today's visit, please see problem-based charting. ? ?Past Medical History:  ?Diagnosis Date  ? Acute cholecystitis 01/21/2021  ? AKI (acute kidney injury) (HCC) 12/04/2018  ? Back pain   ? chronic low back pain L4-5 discectomy:11/99. degenerative thoracic spondylotic changes 4/07  ? Chest pain   ? neg adenosine myoview.  ? Hypertension   ? no LVH EKG-7/05, nl M/C ratio 4/07  ? Hypothyroidism   ? 09/1998  ? Lower extremity edema   ? echo EF 55-65% w/o evidence of Dias dysfx.,   ? Menorrhagia   ? Microcytic anemia   ? 09/1998. Hb-10.5/MCV 76. needs ferritin to determine ACD vs IDA  ? Morbid obesity (HCC)   ? Obstructive sleep apnea   ? severe 7/05 RD 161 per hr. /CPAP 18cwp  ? Plantar fasciitis of left foot 12/16/2020  ? Pneumonia due to COVID-19 virus 12/04/2018  ? Polyarthralgia   ? (knee/back/ankle) w/dx of fibromyalgia? given by [facility] rheum, knee pain chronic 2/2 obesity, fibromyalgia and Sjogren's.  ? Primary Sjogren's syndrome (HCC)   ? anti Ro+;ANA>1:1280 in homogen pattern, negative ds DNA/RF/anti Smith/RNP/C3-4 comp/la/jo1/Scleroderma/centromere, neg HIV/ACE/Hep B/C, nl CXR 2/05, schirmer salivary glandtest not done, symptom rx:eye drops/prednisone/plaquenil referral to [facility] rheum, [name] 3/07. saw [name] but stopped 2/2 cost.  ? Uterine bleeding   ? Viral URI with cough 11/03/2017  ? ?Current Outpatient Medications  ?Medication Sig Dispense Refill  ? albuterol (PROVENTIL HFA;VENTOLIN HFA) 108 (90 Base) MCG/ACT inhaler Inhale 1-2 puffs every 6 (six) hours as needed into the lungs for wheezing or shortness of breath. 1 Inhaler 0  ? amLODipine (NORVASC) 5 MG tablet Take 1 tablet (5 mg total) by mouth daily. 90 tablet 3  ? APPLE CIDER VINEGAR PO Take 1 tablet by mouth  daily. Gummy Goli    ? atorvastatin (LIPITOR) 80 MG tablet Take 1 tablet (80 mg total) by mouth daily. 90 tablet 3  ? chlorthalidone (HYGROTON) 25 MG tablet Take 1 tablet (25 mg total) by mouth daily. 30 tablet 11  ? cholecalciferol (VITAMIN D3) 25 MCG (1000 UNIT) tablet Take 1,000 Units by mouth daily.    ? diclofenac Sodium (VOLTAREN) 1 % GEL Apply 4 g topically every 6 (six) hours as needed (arthritis pain). 150 g 0  ? HYDROcodone-acetaminophen (NORCO/VICODIN) 5-325 MG tablet Take 1 tablet by mouth every 6 (six) hours as needed for severe pain. 60 tablet 0  ? levothyroxine (SYNTHROID) 50 MCG tablet Take 1 tablet (50 mcg total) by mouth daily. 90 tablet 3  ? losartan (COZAAR) 100 MG tablet Take 1 tablet (100 mg total) by mouth daily. 90 tablet 3  ? metoprolol succinate (TOPROL-XL) 50 MG 24 hr tablet Take 1 tablet (50 mg total) by mouth daily. Take with or immediately following a meal. 90 tablet 3  ? ?No current facility-administered medications for this visit.  ? ?Family History  ?Problem Relation Age of Onset  ? Hypertension Mother   ? Cancer Mother   ? Cancer Father   ? Cancer Sister   ? Ovarian cancer Other   ?     family history  ? Breast cancer Other   ?     family history in 1st degree relatives.  ? Thyroid disease Sister   ?  Colon cancer Neg Hx   ? Stomach cancer Neg Hx   ? ?Social History  ? ?Socioeconomic History  ? Marital status: Married  ?  Spouse name: Not on file  ? Number of children: Not on file  ? Years of education: Not on file  ? Highest education level: Not on file  ?Occupational History  ? Occupation: house wife  ?  Employer: UNEMPLOYED  ?Tobacco Use  ? Smoking status: Never  ?  Passive exposure: Current  ? Smokeless tobacco: Never  ?Vaping Use  ? Vaping Use: Never used  ?Substance and Sexual Activity  ? Alcohol use: No  ?  Alcohol/week: 0.0 standard drinks  ? Drug use: No  ? Sexual activity: Not on file  ?Other Topics Concern  ? Not on file  ?Social History Narrative  ? Her daughter Nancy [name])  often comes with her to visits. She also has a grand daughter and great grand daughter(whose name is Nancy [name])  ? ?Social Determinants of Health  ? ?Financial Resource Strain: Not on file  ?Food Insecurity: Not on file  ?Transportation Needs: Not on file  ?Physical Activity: Not on file  ?Stress: Not on file  ?Social Connections: Not on file  ? ?Objective:  ?Physical Exam: ?Vitals:  ? 07/21/21 1013 07/21/21 1047  ?BP: (!) 172/92 (!) 168/82  ?Pulse: 72 71  ?Temp: 98.2 ?F (36.8 ?C)   ?TempSrc: Oral   ?SpO2: 98%   ?Weight: 249 lb 3.2 oz (113 kg)   ?Height: 5\' 3"  (1.6 m)   ? ?General appearance: alert, cooperative, and no distress ?Extremities: extremities normal, atraumatic, no cyanosis or edema ?Assessment & Plan:  ? ?See Encounters tab for problem-based charting. ? ?[name] was seen today for follow-up. ? ?Diagnoses and all orders for this visit: ? ?Essential hypertension ?-     BMP8+Anion Gap ?-     Discontinue: amLODipine (NORVASC) 5 MG tablet; Take 1 tablet (5 mg total) by mouth daily. ?-     amLODipine (NORVASC) 5 MG tablet; Take 1 tablet (5 mg total) by mouth daily. ? ?Morbid obesity with body mass index (BMI) of 40.0 to 44.9 in adult [facility]) ? ?Polyarthralgia ? ?Hyperlipidemia, unspecified hyperlipidemia type ? ?Arthralgia of left lower leg ? ?  ?Orders Placed This Encounter  ?Procedures  ? BMP8+Anion Gap  ? ?Meds ordered this encounter  ?Medications  ? DISCONTD: amLODipine (NORVASC) 5 MG tablet  ?  Sig: Take 1 tablet (5 mg total) by mouth daily.  ?  Dispense:  90 tablet  ?  Refill:  3  ? amLODipine (NORVASC) 5 MG tablet  ?  Sig: Take 1 tablet (5 mg total) by mouth daily.  ?  Dispense:  90 tablet  ?  Refill:  3  ? ?Return in about 8 weeks (around 09/15/2021) for f/u BP. ? ?11/16/2021, MD ? ?

## 2021-07-21 NOTE — Assessment & Plan Note (Signed)
Established with rheumatology, Dr. Dimple Casey, who is considering methotrexate therapy for inflammatory arthritis, RA vs Sjogren-related arthralgia. Ms. Whitmyer was hesitant today as she feels she is taking too many medications. I encouraged her to strongly consider this medication if prescribed given likely benefit on pain as well as prevention of further joint damage. F/u with Dr. Dimple Casey 5/24. ?

## 2021-07-22 ENCOUNTER — Ambulatory Visit: Payer: Medicare PPO | Admitting: Rehabilitative and Restorative Service Providers"

## 2021-07-22 ENCOUNTER — Encounter: Payer: Self-pay | Admitting: Rehabilitative and Restorative Service Providers"

## 2021-07-22 DIAGNOSIS — M6281 Muscle weakness (generalized): Secondary | ICD-10-CM

## 2021-07-22 DIAGNOSIS — G8929 Other chronic pain: Secondary | ICD-10-CM | POA: Diagnosis not present

## 2021-07-22 DIAGNOSIS — R262 Difficulty in walking, not elsewhere classified: Secondary | ICD-10-CM | POA: Diagnosis not present

## 2021-07-22 DIAGNOSIS — M25562 Pain in left knee: Secondary | ICD-10-CM | POA: Diagnosis not present

## 2021-07-22 DIAGNOSIS — M25662 Stiffness of left knee, not elsewhere classified: Secondary | ICD-10-CM

## 2021-07-22 LAB — BMP8+ANION GAP
Anion Gap: 14 mmol/L (ref 10.0–18.0)
BUN/Creatinine Ratio: 22 (ref 12–28)
BUN: 18 mg/dL (ref 8–27)
CO2: 27 mmol/L (ref 20–29)
Calcium: 9 mg/dL (ref 8.7–10.3)
Chloride: 100 mmol/L (ref 96–106)
Creatinine, Ser: 0.83 mg/dL (ref 0.57–1.00)
Glucose: 94 mg/dL (ref 70–99)
Potassium: 4.5 mmol/L (ref 3.5–5.2)
Sodium: 141 mmol/L (ref 134–144)
eGFR: 77 mL/min/{1.73_m2}

## 2021-07-22 NOTE — Therapy (Signed)
?OUTPATIENT PHYSICAL THERAPY LOWER EXTREMITY EVALUATION ? ?Referring diagnosis? M25.562,G89.29 (ICD-10-CM) - Chronic pain of left knee  ?Treatment diagnosis? (if different than referring diagnosis) R26.2  M62.81  M25.662  M25.562 ?What was this (referring dx) caused by? ?[]  Surgery ?[]  Fall ?[]  Ongoing issue ?[x]  Arthritis ?[]  Other: ____________ ? ?Laterality: ?[]  Rt ?[x]  Lt ?[]  Both ? ?Check all possible CPT codes:  *CHOOSE 10 OR LESS*    ?[x]  97110 (Therapeutic Exercise)  []  1610992507 (SLP Treatment)  ?[x]  [id number] (Neuro Re-ed)   []  92526 (Swallowing Treatment)  ? [x]  [id number] (Gait Training)   []  [id number] (Cognitive Training, 1st 15 minutes) ?[x]  97140 (Manual Therapy)   []  97130 (Cognitive Training, each add'l 15 minutes)  ?[x]  [id number] (Re-evaluation)                              []  Other, List CPT Code ____________  ?[x]  97530 (Therapeutic Activities)     ?[x]  97535 (Self Care)  ? []  All codes above (97110 - 97535) ? []  97012 (Mechanical Traction) ? [x]  97014 (E-stim Unattended) ? []  97032 (E-stim manual) ? []  [id number] (Ionto) ? []  97035 (Ultrasound) ? []  [phone] Furniture conservator/restorer(Orthotic Fit) ?[x]  [id number] (Physical Performance Training) ?[]  [id number] (Aquatic Therapy) ?[]  1308697034 (Contrast Bath) ?[]  [id number] (Paraffin) ?[]  97597 (Wound Care 1st 20 sq cm) ?[]  97598 (Wound Care each add'l 20 sq cm) ?[x]  97016 (Vasopneumatic Device) ?[]  [phone] Public affairs consultant(Orthotic Training) ?[]  [id number] (Prosthetic Training)  ?Patient Name: Nancy [name] ?MRN: [id number] ?DOB:05/20/1955, 66 y.o., female ?Today's Date: 07/22/2021 ? ? PT End of Session - 07/22/21 1647   ? ? Visit Number 1   ? Number of Visits 10   ? Authorization Type [employer]   ? Authorization - Number of Visits 10   ? Progress Note Due on Visit 10   ? PT Start Time 0930   ? PT Stop Time 1015   ? PT Time Calculation (min) 45 min   ? Activity Tolerance Patient limited by lethargy;Patient limited by pain   ? Behavior [facility] for tasks assessed/performed   ? ?  ?  ? ?  ? ? ?Past Medical History:  ?Diagnosis Date  ?  Acute cholecystitis 01/21/2021  ? AKI (acute kidney injury) (HCC) 12/04/2018  ? Back pain   ? chronic low back pain L4-5 discectomy:11/99. degenerative thoracic spondylotic changes 4/07  ? Chest pain   ? neg adenosine myoview.  ? Hypertension   ? no LVH EKG-7/05, nl M/C ratio 4/07  ? Hypothyroidism   ? 09/1998  ? Lower extremity edema   ? echo EF 55-65% w/o evidence of Dias dysfx.,   ? Menorrhagia   ? Microcytic anemia   ? 09/1998. Hb-10.5/MCV 76. needs ferritin to determine ACD vs IDA  ? Morbid obesity (HCC)   ? Obstructive sleep apnea   ? severe 7/05 RD 161 per hr. /CPAP 18cwp  ? Plantar fasciitis of left foot 12/16/2020  ? Pneumonia due to COVID-19 virus 12/04/2018  ? Polyarthralgia   ? (knee/back/ankle) w/dx of fibromyalgia? [facility], knee pain chronic 2/2 obesity, fibromyalgia and Sjogren's.  ? Primary Sjogren's syndrome (HCC)   ? anti Ro+;ANA>1:1280 in homogen pattern, negative ds DNA/RF/anti Smith/RNP/C3-4 comp/[name]/jo1/Scleroderma/centromere, neg HIV/ACE/Hep B/C, nl CXR 2/05, schirmer salivary glandtest not done, symptom rx:eye drops/prednisone/plaquenil [facility], Dr. Rushie NyhanBourne/o'Nancy [name] 3/07. saw Dr>Nancy [name] but stopped 2/2 cost.  ? Uterine bleeding   ? Viral URI with cough 11/03/2017  ? ?  Past Surgical History:  ?Procedure Laterality Date  ? CHOLECYSTECTOMY N/A 01/23/2021  ? Procedure: LAPAROSCOPIC CHOLECYSTECTOMY;  Surgeon: Nancy Gelinas, MD;  Location: [facility] OR;  Service: General;  Laterality: N/A;  ? EXAMINATION UNDER ANESTHESIA  11/23/2011  ? Procedure: EXAM UNDER ANESTHESIA;  Surgeon: Nancy Newcomer, MD;  Location: [facility] ORS;  Service: Gynecology;  Laterality: N/A;  ? HYSTEROSCOPY WITH D & C  11/23/2011  ? Procedure: DILATATION AND CURETTAGE /HYSTEROSCOPY;  Surgeon: Nancy Newcomer, MD;  Location: [facility] ORS;  Service: Gynecology;  Laterality: N/A;  ? LUMBAR FUSION  1999  ? SHOULDER SURGERY  unk  ? TONSILLECTOMY    ? TUBAL LIGATION  1977  ? WISDOM TOOTH EXTRACTION    ? ?Patient Active  Problem List  ? Diagnosis Date Noted  ? Bilateral hand pain 07/10/2021  ? Rheumatoid factor positive 07/10/2021  ? High risk medication use 07/10/2021  ? Hyperlipidemia 04/28/2021  ? Nocturia 12/16/2020  ? Asymmetrical sensorineural hearing loss 08/09/2019  ? Wheelchair dependence 12/17/2016  ? Polyarthralgia 12/02/2016  ? Healthcare maintenance 12/18/2014  ? GERD (gastroesophageal reflux disease) 04/13/2014  ? Allergic rhinitis 01/24/2014  ? Morbid obesity with body mass index (BMI) of 40.0 to 44.9 in adult [facility]) 09/14/2013  ? Abnormal CT of brain 11/19/2011  ? MACULAR DEGENERATION, BILATERAL 05/22/2008  ? Essential hypertension 03/17/2006  ? SICCA SYNDROME 03/17/2006  ? KNEE PAIN, CHRONIC 03/17/2006  ? Chronic low back pain 03/17/2006  ? Obstructive sleep apnea 09/07/2003  ? Hypothyroidism 09/07/1998  ? Microcytic anemia 09/07/1998  ? ? ?PCP: Nancy La, MD ? ?REFERRING PROVIDER: Kathryne Hitch, MD ? ?REFERRING DIAG: M25.562,G89.29 (ICD-10-CM) - Chronic pain of left knee  ? ?THERAPY DIAG:  ?Difficulty in walking, not elsewhere classified ? ?Muscle weakness (generalized) ? ?Stiffness of left knee, not elsewhere classified ? ?Chronic pain of left knee ? ?ONSET DATE: 3 months ? ?SUBJECTIVE:  ? ?SUBJECTIVE STATEMENT: ?Nancy [name] has had increasing L knee pain over the past 3 months.  She has been using a scooter for "a long time" due to her knees, back and general RA, OA and weakness. ? ?PERTINENT HISTORY: ?Morbid obesity, HTN, hypothyroid, RA, chronic LBP (previous lumbar fusion) and previous shoulder surgery (unknown) ? ?PAIN:  ?Are you having pain? Yes: NPRS scale: 7/10 ?Pain location: L knee ?Pain description: Sore, achy ?Aggravating factors: Prolonged postures, sleeping, WB ?Relieving factors: None ? ?PRECAUTIONS: Back ? ?WEIGHT BEARING RESTRICTIONS No ? ?FALLS:  ?Has patient fallen in last 6 months? No ? ?LIVING ENVIRONMENT: ?Lives with: lives with their family ?Lives in: House/apartment ?Stairs:  Santresa is  unable to do stairs ?Has following equipment at home:  Scooter ? ? ?PLOF: Needs assistance with ADLs ? ?PATIENT GOALS Less L knee pain so she can stand and walk in the house ? ? ?OBJECTIVE:  ? ?DIAGNOSTIC FINDINGS: IMPRESSION: ?1. Mildly worsened moderate to severe medial compartment ?osteoarthritis. ?2. Mild patellofemoral osteoarthritis. ? ?PATIENT SURVEYS:  ?FOTO 47 (Goal 65) ? ?COGNITION: ? Overall cognitive status: Within functional limits for tasks assessed   ?  ?SENSATION: ?No complaints of numbness or tingling ? ? ?LE ROM: ? ?Active ROM Right ?07/22/2021 Left ?07/22/2021  ?Hip flexion    ?Hip extension    ?Hip abduction    ?Hip adduction    ?Hip internal rotation    ?Hip external rotation    ?Knee flexion 107 110  ?Knee extension -5 -15  ?Ankle dorsiflexion    ?Ankle plantarflexion    ?Ankle inversion    ?Ankle  eversion    ? (Blank rows = not tested) ? ?LE MMT: ? ?MMT Right ?07/22/2021 Left ?07/22/2021  ?Hip flexion    ?Hip extension    ?Hip abduction    ?Hip adduction    ?Hip internal rotation    ?Hip external rotation    ?Knee flexion    ?Knee extension 45.7 pounds 13.6 pounds  ?Ankle dorsiflexion    ?Ankle plantarflexion    ?Ankle inversion    ?Ankle eversion    ? (Blank rows = not tested) ? ?GAIT: ? ?Comments: Seylah uses a scooter out in the community.  She stands and takes a few steps at home. ? ? ? ?TODAY'S TREATMENT: ?Seated knee extension AROM with slow eccentrics 2 sets of 10 ? ? ?PATIENT EDUCATION:  ?Education details: Starter HEP ?Person educated: Patient ?Education method: Explanation, Demonstration, Tactile cues, Verbal cues, and Handouts ?Education comprehension: verbalized understanding, returned demonstration, verbal cues required, tactile cues required, and needs further education ? ? ?HOME EXERCISE PROGRAM: ?Access Code: [email or web address] ?URL: [email or web address] ?Date: 07/22/2021 ?Prepared by: Pauletta Browns ? ?Exercises ?- Seated Knee Extension AROM  - 5 x daily - 7 x weekly - 2 sets -  10 reps ? ?ASSESSMENT: ? ?CLINICAL IMPRESSION: ?Patient is a 66 y.o. female who was seen today for physical therapy evaluation and treatment for chronic L knee pain.  Because she has significant arthritis an

## 2021-07-23 ENCOUNTER — Encounter: Payer: Self-pay | Admitting: Internal Medicine

## 2021-07-28 ENCOUNTER — Ambulatory Visit (INDEPENDENT_AMBULATORY_CARE_PROVIDER_SITE_OTHER): Payer: Medicare PPO | Admitting: Rehabilitative and Restorative Service Providers"

## 2021-07-28 ENCOUNTER — Encounter: Payer: Self-pay | Admitting: Rehabilitative and Restorative Service Providers"

## 2021-07-28 DIAGNOSIS — M25562 Pain in left knee: Secondary | ICD-10-CM | POA: Diagnosis not present

## 2021-07-28 DIAGNOSIS — R262 Difficulty in walking, not elsewhere classified: Secondary | ICD-10-CM | POA: Diagnosis not present

## 2021-07-28 DIAGNOSIS — G8929 Other chronic pain: Secondary | ICD-10-CM

## 2021-07-28 DIAGNOSIS — M6281 Muscle weakness (generalized): Secondary | ICD-10-CM

## 2021-07-28 DIAGNOSIS — M25662 Stiffness of left knee, not elsewhere classified: Secondary | ICD-10-CM

## 2021-07-28 NOTE — Therapy (Addendum)
OUTPATIENT PHYSICAL THERAPY TREATMENT NOTE [facility]   Patient Name: Nancy [name] MRN: [id number] DOB:Mar 20, 1955, 66 y.o., female Today's Date: 08/26/2021  END OF SESSION:     PT End of Session - 07/28/21 1517     Visit Number 2    Number of Visits 10    Authorization Type [employer]    Authorization Time Period 07/22/2021- 10/13/2021    Authorization - Visit Number 2   Authorization - Number of Visits 12    Progress Note Due on Visit 10    PT Start Time 1030    PT Stop Time 1557    PT Time Calculation (min) 40 min    Activity Tolerance Patient limited by pain    Behavior During Therapy [facility] for tasks assessed/performed             Past Medical History:  Diagnosis Date   Acute cholecystitis 01/21/2021   AKI (acute kidney injury) ([place]) 12/04/2018   Back pain    chronic low back pain L4-5 discectomy:11/99. degenerative thoracic spondylotic changes 4/07   Chest pain    neg adenosine myoview.   Hypertension    no LVH EKG-7/05, nl M/C ratio 4/07   Hypothyroidism    09/1998   Lower extremity edema    echo EF 55-65% w/o evidence of Dias dysfx.,    Menorrhagia    Microcytic anemia    09/1998. Hb-10.5/MCV 76. needs ferritin to determine ACD vs IDA   Morbid obesity ([place])    Obstructive sleep apnea    severe 7/05 RD 161 per hr. /CPAP 18cwp   Plantar fasciitis of left foot 12/16/2020   Pneumonia due to COVID-19 virus 12/04/2018   Polyarthralgia    (knee/back/ankle) w/dx of fibromyalgia? given by [facility] rheum, knee pain chronic 2/2 obesity, fibromyalgia and Sjogren's.   Primary Sjogren's syndrome ([place])    anti Ro+;ANA>1:1280 in homogen pattern, negative ds DNA/RF/anti Smith/RNP/C3-4 comp/la/jo1/Scleroderma/centromere, neg HIV/ACE/Hep B/C, nl CXR 2/05, schirmer salivary glandtest not done, symptom rx:eye drops/prednisone/plaquenil referral to [facility] rheum, Dr. Wynelle Cleveland 3/07. saw [name] but stopped 2/2 cost.   Uterine bleeding    Viral URI with cough 11/03/2017    Past Surgical History:  Procedure Laterality Date   CHOLECYSTECTOMY N/A 01/23/2021   Procedure: LAPAROSCOPIC CHOLECYSTECTOMY;  Surgeon: Georganna Skeans, MD;  Location: [place];  Service: General;  Laterality: N/A;   EXAMINATION UNDER ANESTHESIA  11/23/2011   Procedure: EXAM UNDER ANESTHESIA;  Surgeon: Osborne Oman, MD;  Location: [facility];  Service: Gynecology;  Laterality: N/A;   HYSTEROSCOPY WITH D & C  11/23/2011   Procedure: DILATATION AND CURETTAGE /HYSTEROSCOPY;  Surgeon: Osborne Oman, MD;  Location: [facility];  Service: Gynecology;  Laterality: N/A;   LUMBAR FUSION  1999   SHOULDER SURGERY  unk   TONSILLECTOMY     TUBAL LIGATION  1977   WISDOM TOOTH EXTRACTION     Patient Active Problem List   Diagnosis Date Noted   Bilateral hand pain 07/10/2021   Rheumatoid factor positive 07/10/2021   High risk medication use 07/10/2021   Hyperlipidemia 04/28/2021   Nocturia 12/16/2020   Asymmetrical sensorineural hearing loss 08/09/2019   Wheelchair dependence 12/17/2016   Polyarthralgia 12/02/2016   Healthcare maintenance 12/18/2014   GERD (gastroesophageal reflux disease) 04/13/2014   Allergic rhinitis 01/24/2014   Morbid obesity with body mass index (BMI) of 40.0 to 44.9 in adult [facility]) 09/14/2013   Abnormal CT of brain 11/19/2011   MACULAR DEGENERATION, BILATERAL 05/22/2008   Essential hypertension 03/17/2006  SICCA SYNDROME 03/17/2006   KNEE PAIN, CHRONIC 03/17/2006   Chronic low back pain 03/17/2006   Obstructive sleep apnea 09/07/2003   Hypothyroidism 09/07/1998   Microcytic anemia 09/07/1998    PCP: Charise Killian, MD   REFERRING PROVIDER: Mcarthur Rossetti, MD   REFERRING DIAG: [phone] (ICD-10-CM) - Chronic pain of left knee   ONSET DATE: 3 months  THERAPY DIAG:  Difficulty in walking, not elsewhere classified  Muscle weakness (generalized)  Stiffness of left knee, not elsewhere classified  Chronic pain of left knee  Rationale for Evaluation and  Treatment Rehabilitation  PERTINENT HISTORY: Morbid obesity, HTN, hypothyroid, RA, chronic LBP (previous lumbar fusion) and previous shoulder surgery (unknown)  PRECAUTIONS: no surgical protocols  SUBJECTIVE: Pt indicated short walking within house with walker.  Pt indicated sore in knee, pain about 8/10.   Pt indicated trying to do 20 every few hours.   PAIN:  NPRS scale: 8/10 at worst.   Pain location: Lt knee Pain description: Sore, achy Aggravating factors: Prolonged postures, sleeping, WB Relieving factors: None   OBJECTIVE:    DIAGNOSTIC FINDINGS: 07/22/2021 :  review of imagine in chart: IMPRESSION: 1. Mildly worsened moderate to severe medial compartment osteoarthritis. 2. Mild patellofemoral osteoarthritis.   PATIENT SURVEYS:  07/22/2021 FOTO 47 (Goal 65)   COGNITION: 07/22/2021 Overall cognitive status: Within functional limits for tasks assessed                          SENSATION: 07/22/2021 No complaints of numbness or tingling     LE ROM:   Active ROM Right 07/22/2021 Left 07/22/2021  Hip flexion      Hip extension      Hip abduction      Hip adduction      Hip internal rotation      Hip external rotation      Knee flexion 107 110  Knee extension -5 -15  Ankle dorsiflexion      Ankle plantarflexion      Ankle inversion      Ankle eversion       (Blank rows = not tested)   LE MMT:   MMT Right 07/22/2021 Left 07/22/2021  Hip flexion      Hip extension      Hip abduction      Hip adduction      Hip internal rotation      Hip external rotation      Knee flexion      Knee extension 45.7 pounds 13.6 pounds  Ankle dorsiflexion      Ankle plantarflexion      Ankle inversion      Ankle eversion       (Blank rows = not tested)   GAIT: 07/28/2021:  55 ft within clinic c SBA, use of rollator.  Noted reduced step length bilateral c minimal hip extension bilateral, decreased stance on Lt leg, increased forward trunk lean.    07/22/2021 Comments:  Nancy [name] uses a scooter out in the community.  She stands and takes a few steps at home.       TODAY'S TREATMENT: 07/28/2021:  Therex:   Seated isometric hold to tolerance 5 sec alt extension/flexion Lt knee x 6 each , performed x2 sets   Seated Lt LAQ x 10   Nustep lvl 5 (small arc movement to tolerance) x3 mins c rest breaks    Instruction and review of HEP c addition of supine heel slide c  cues for activity        TherActivity   Seated 20 inch table transfer s UE assist x 5 (slow movement pattern requiring more time)   Ambulation c SBA c FWW 55 ft (limited by knee pain and fatigue     Manual:   Seated Lt knee flexion mobilization c movement c IR/distraction to tolerance.     07/22/2021 Seated knee extension AROM with slow eccentrics 2 sets of 10     PATIENT EDUCATION:  07/22/2021 Education details: HEP addition.  Person educated: Patient Education method: Explanation, Demonstration, Tactile cues, Verbal cues, and Handouts Education comprehension: verbalized understanding, returned demonstration, verbal cues required, tactile cues required, and needs further education     HOME EXERCISE PROGRAM: Access Code: [email or web address] URL: [email or web address] Date: 07/28/2021 Prepared by: Scot Jun  Exercises - Seated Knee Extension AROM  - 5 x daily - 7 x weekly - 2 sets - 10 reps - Supine Heel Slide (Mirrored)  - 3-5 x daily - 7 x weekly - 1-2 sets - 10 reps - 2 hold   ASSESSMENT:   CLINICAL IMPRESSION: Noted impairments in Lt knee pain c mobility, strength that impacts functional movement and progressive mobility.  Poor tolerance to ambulation due to fatigue and knee pain.  Pt to benefit from specific low impact skilled PT services to start to improve functional capacity.      OBJECTIVE IMPAIRMENTS Abnormal gait, cardiopulmonary status limiting activity, decreased activity tolerance, decreased balance, decreased endurance, decreased knowledge of condition,  decreased mobility, difficulty walking, decreased ROM, decreased strength, increased edema, obesity, and pain.    ACTIVITY LIMITATIONS community activity and ADLs .    PERSONAL FACTORS Morbid obesity, HTN, hypothyroid, RA, chronic LBP (previous lumbar fusion) and previous shoulder surgery (unknown) are also affecting patient's functional outcome.      REHAB POTENTIAL: Good   CLINICAL DECISION MAKING: Stable/uncomplicated   EVALUATION COMPLEXITY: Low     GOALS: Goals reviewed with patient? Yes   SHORT TERM GOALS: Target date: 08/19/2021     Nancy [name] will improve Lt quadriceps strength to at least 30 pounds. Baseline: 13.6 pounds Goal status: on going - assessed 07/28/2021   2.  Improve Lt knee extension AROM to -5 degrees. Baseline: -15 degrees Goal status: on going - assessed 07/28/2021     LONG TERM GOALS: Target date: 09/16/2021     Improve FOTO to 65. Baseline: 47 Goal status: INITIAL   2.  Improve L knee pain to consistently 0-4/10 on the Numeric Pain Rating Scale. Baseline: 7/10 Goal status: INITIAL   3.  Improve B quadriceps strength to 70 pounds or greater. Baseline: 13-45 pounds Goal status: INITIAL   4.  Nancy [name] will be able to stand for up to 5 minutes and walk short distances in the house at DC. Baseline: Very limited Goal status: INITIAL   5.  Nancy [name] will be independent with her long-term HEP at DC. Baseline: Started 07/22/2021 Goal status: INITIAL     PLAN: PT FREQUENCY: 1-2x/week   PT DURATION: 8 weeks   PLANNED INTERVENTIONS: Therapeutic exercises, Therapeutic activity, Neuromuscular re-education, Balance training, Gait training, Patient/Family education, Stair training, Electrical stimulation, Cryotherapy, Vasopneumatic device, Manual therapy, and Re-evaluation   PLAN FOR NEXT SESSION: Attempt quad activation in NWB for pain tolerance, general mobility improvements.   Scot Jun, PT, DPT, OCS, ATC 08/26/21  10:49 AM   PHYSICAL THERAPY DISCHARGE  SUMMARY  Visits from Start of Care: 2  Current functional level related to goals / functional outcomes:  See note   Remaining deficits: See note   Education / Equipment: HEP   Patient agrees to discharge. Patient goals were partially met. Patient is being discharged due to not returning since the last visit.  Scot Jun, PT, DPT, OCS, ATC 09/18/21  9:00 AM

## 2021-07-30 ENCOUNTER — Ambulatory Visit: Payer: Medicare PPO | Admitting: Internal Medicine

## 2021-07-30 DIAGNOSIS — M2550 Pain in unspecified joint: Secondary | ICD-10-CM

## 2021-07-30 DIAGNOSIS — R768 Other specified abnormal immunological findings in serum: Secondary | ICD-10-CM

## 2021-07-30 DIAGNOSIS — M3500 Sicca syndrome, unspecified: Secondary | ICD-10-CM

## 2021-07-30 DIAGNOSIS — Z79899 Other long term (current) drug therapy: Secondary | ICD-10-CM

## 2021-07-30 DIAGNOSIS — Z993 Dependence on wheelchair: Secondary | ICD-10-CM

## 2021-08-05 ENCOUNTER — Inpatient Hospital Stay: Admission: RE | Admit: 2021-08-05 | Payer: Medicare PPO | Source: Ambulatory Visit

## 2021-08-07 ENCOUNTER — Encounter: Payer: Medicare PPO | Admitting: Rehabilitative and Restorative Service Providers"

## 2021-08-07 NOTE — Progress Notes (Deleted)
Office Visit Note  Patient: Nancy [name]             Date of Birth: 09-Apr-1955           MRN: [id number]             PCP: Charise Killian, MD Referring: Charise Killian, MD Visit Date: 08/21/2021   Subjective:  No chief complaint on file.   History of Present Illness: Nancy [name] is a 66 y.o. female here for follow up for joint pain in multiple areas and abnormal labs concerning for inflammatory arthritis.  Previous HPI 07/10/2021 Nancy [name] is a 66 y.o. female here for evaluation of joint pain in multiple areas and abnormal labs concerning for inflammatory arthritis. She apparently had previous rheumatology assessment with [facility] but not on long term DMARD treatment. Bilateral hand pain affecting her knuckles she sees swelling of MCP joints or PIP joints intermittently. On other days not much morning stiffness and pain. She has ongoing knee pain worst on the left side. She saw Dr. Ninfa Linden with intraarticular steroid injection a week ago and she had improvement but still a lot of active joint pain. Mobility is limited using a walker or more often wheelchair due to pain in her knees while weightbearing. She takes tylenol or occasionally hydrocodone for pain.    Labs reviewed ANA neg SSA >8.0 SSB neg RF 75.1 CCP >250 ESR 38     No Rheumatology ROS completed.   PMFS History:  Patient Active Problem List   Diagnosis Date Noted   Bilateral hand pain 07/10/2021   Rheumatoid factor positive 07/10/2021   High risk medication use 07/10/2021   Hyperlipidemia 04/28/2021   Nocturia 12/16/2020   Asymmetrical sensorineural hearing loss 08/09/2019   Wheelchair dependence 12/17/2016   Polyarthralgia 12/02/2016   Healthcare maintenance 12/18/2014   GERD (gastroesophageal reflux disease) 04/13/2014   Allergic rhinitis 01/24/2014   Morbid obesity with body mass index (BMI) of 40.0 to 44.9 in adult [facility]) 09/14/2013   Abnormal CT of brain 11/19/2011   MACULAR DEGENERATION, BILATERAL  05/22/2008   Essential hypertension 03/17/2006   SICCA SYNDROME 03/17/2006   KNEE PAIN, CHRONIC 03/17/2006   Chronic low back pain 03/17/2006   Obstructive sleep apnea 09/07/2003   Hypothyroidism 09/07/1998   Microcytic anemia 09/07/1998    Past Medical History:  Diagnosis Date   Acute cholecystitis 01/21/2021   AKI (acute kidney injury) ([place]) 12/04/2018   Back pain    chronic low back pain L4-5 discectomy:11/99. degenerative thoracic spondylotic changes 4/07   Chest pain    neg adenosine myoview.   Hypertension    no LVH EKG-7/05, nl M/C ratio 4/07   Hypothyroidism    09/1998   Lower extremity edema    echo EF 55-65% w/o evidence of Dias dysfx.,    Menorrhagia    Microcytic anemia    09/1998. Hb-10.5/MCV 76. needs ferritin to determine ACD vs IDA   Morbid obesity ([place])    Obstructive sleep apnea    severe 7/05 RD 161 per hr. /CPAP 18cwp   Plantar fasciitis of left foot 12/16/2020   Pneumonia due to COVID-19 virus 12/04/2018   Polyarthralgia    (knee/back/ankle) w/dx of fibromyalgia? given by [facility] rheum, knee pain chronic 2/2 obesity, fibromyalgia and Sjogren's.   Primary Sjogren's syndrome ([place])    anti Ro+;ANA>1:1280 in homogen pattern, negative ds DNA/RF/anti Smith/RNP/C3-4 comp/la/jo1/Scleroderma/centromere, neg HIV/ACE/Hep B/C, nl CXR 2/05, schirmer salivary glandtest not done, symptom rx:eye drops/prednisone/plaquenil referral to [facility]  rheum, Dr. Wynelle Cleveland 3/07. saw [name] but stopped 2/2 cost.   Uterine bleeding    Viral URI with cough 11/03/2017    Family History  Problem Relation Age of Onset   Hypertension Mother    Cancer Mother    Cancer Father    Cancer Sister    Ovarian cancer Other        family history   Breast cancer Other        family history in 1st degree relatives.   Thyroid disease Sister    Colon cancer Neg Hx    Stomach cancer Neg Hx    Past Surgical History:  Procedure Laterality Date   CHOLECYSTECTOMY N/A 01/23/2021    Procedure: LAPAROSCOPIC CHOLECYSTECTOMY;  Surgeon: Georganna Skeans, MD;  Location: [place];  Service: General;  Laterality: N/A;   EXAMINATION UNDER ANESTHESIA  11/23/2011   Procedure: EXAM UNDER ANESTHESIA;  Surgeon: Osborne Oman, MD;  Location: [facility];  Service: Gynecology;  Laterality: N/A;   HYSTEROSCOPY WITH D & C  11/23/2011   Procedure: DILATATION AND CURETTAGE /HYSTEROSCOPY;  Surgeon: Osborne Oman, MD;  Location: [facility];  Service: Gynecology;  Laterality: N/A;   LUMBAR FUSION  1999   SHOULDER SURGERY  unk   TONSILLECTOMY     TUBAL LIGATION  1977   WISDOM TOOTH EXTRACTION     Social History   Social History Narrative   Her daughter Varney Biles) often comes with her to visits. She also has a grand daughter and great grand daughter(whose name is [place])   Immunization History  Administered Date(s) Administered   Fluad Quad(high Dose 65+) 12/08/2018, 12/16/2020   Moderna Covid-19 Vaccine Bivalent Booster 2yr & up 12/28/2020   PFIZER(Purple Top)SARS-COV-2 Vaccination 05/18/2019, 06/12/2019, 12/16/2019   PNEUMOCOCCAL CONJUGATE-20 12/16/2020   Pneumococcal Polysaccharide-23 12/08/2018   Td 04/29/2009, 09/13/2009     Objective: Vital Signs: There were no vitals taken for this visit.   Physical Exam   Musculoskeletal Exam: ***  CDAI Exam: CDAI Score: -- Patient Global: --; Provider Global: -- Swollen: --; Tender: -- Joint Exam 08/21/2021   No joint exam has been documented for this visit   There is currently no information documented on the homunculus. Go to the Rheumatology activity and complete the homunculus joint exam.  Investigation: No additional findings.  Imaging: XR Hand 2 View Left  Result Date: 07/10/2021 X-ray left hand 2 views Radiocarpal joint space appears normal.  Mild carpal bone degenerative changes with few subchondral cyst.  Thumb osteoarthritis changes more advanced distally.  MCP joint spaces look normal could have some juxta articular osteopenia.   Mild DIP joint degenerative changes.  Bone mineralization otherwise appears normal and no erosions seen. Impression Mild appearing osteoarthritis changes, possible proximal finger joint juxta-articular osteopenia could be consistent with chronic inflammatory arthritis but no erosive disease  XR Hand 2 View Right  Result Date: 07/10/2021 X-ray right hand 2 views Radiocarpal joint space appears normal.  Carpal bones mostly normal few cystic changes are present.  Moderate degenerative appearing changes in first MCP and IP joints.  Small lateral osteophyte or enthesophyte third MCP joint subchondral cyst in metacarpal heads.  Mild asymmetric joint space narrowing of DIPs.  Possible juxta articular osteopenia in proximal finger joints bone mineralization otherwise appears normal and no erosions are present. Impression Mostly mild appearing osteoarthritis somewhat worse in the thumb, possible juxta-articular osteopenia could be consistent with longstanding inflammatory arthritis but no erosive disease   Recent Labs: Lab Results  Component Value Date  WBC 5.3 07/10/2021   HGB 11.0 (L) 07/10/2021   PLT 268 07/10/2021   NA 141 07/21/2021   K 4.5 07/21/2021   CL 100 07/21/2021   CO2 27 07/21/2021   GLUCOSE 94 07/21/2021   BUN 18 07/21/2021   CREATININE 0.83 07/21/2021   BILITOT 0.7 07/10/2021   ALKPHOS 53 01/24/2021   AST 14 07/10/2021   ALT 8 07/10/2021   PROT 7.3 07/10/2021   ALBUMIN 2.8 (L) 01/24/2021   CALCIUM 9.0 07/21/2021   GFRAA >60 09/06/2019    Speciality Comments: No specialty comments available.  Procedures:  No procedures performed Allergies: Amlodipine, Coconut fatty acids, and Naproxen sodium   Assessment / Plan:     Visit Diagnoses: No diagnosis found.  ***  Orders: No orders of the defined types were placed in this encounter.  No orders of the defined types were placed in this encounter.    Follow-Up Instructions: No follow-ups on file.   Earnestine Mealing,  CMA  Note - This record has been created using Editor, commissioning.  Chart creation errors have been sought, but may not always  have been located. Such creation errors do not reflect on  the standard of medical care.

## 2021-08-13 ENCOUNTER — Ambulatory Visit: Payer: Medicare PPO | Admitting: Orthopaedic Surgery

## 2021-08-21 ENCOUNTER — Ambulatory Visit: Payer: Medicare PPO | Admitting: Internal Medicine

## 2021-08-21 DIAGNOSIS — M2550 Pain in unspecified joint: Secondary | ICD-10-CM

## 2021-08-21 DIAGNOSIS — Z993 Dependence on wheelchair: Secondary | ICD-10-CM

## 2021-08-21 DIAGNOSIS — M79642 Pain in left hand: Secondary | ICD-10-CM

## 2021-08-21 DIAGNOSIS — M3500 Sicca syndrome, unspecified: Secondary | ICD-10-CM

## 2021-08-21 DIAGNOSIS — Z79899 Other long term (current) drug therapy: Secondary | ICD-10-CM

## 2021-08-21 DIAGNOSIS — R768 Other specified abnormal immunological findings in serum: Secondary | ICD-10-CM

## 2021-08-26 ENCOUNTER — Telehealth: Payer: Self-pay | Admitting: Rehabilitative and Restorative Service Providers"

## 2021-08-26 ENCOUNTER — Encounter: Payer: Medicare PPO | Admitting: Rehabilitative and Restorative Service Providers"

## 2021-08-26 NOTE — Telephone Encounter (Signed)
Left VM to check on after NS today and to schedule further appointments.

## 2021-09-17 DIAGNOSIS — H2513 Age-related nuclear cataract, bilateral: Secondary | ICD-10-CM | POA: Diagnosis not present

## 2021-09-17 DIAGNOSIS — H04123 Dry eye syndrome of bilateral lacrimal glands: Secondary | ICD-10-CM | POA: Diagnosis not present

## 2021-09-17 DIAGNOSIS — H353131 Nonexudative age-related macular degeneration, bilateral, early dry stage: Secondary | ICD-10-CM | POA: Diagnosis not present

## 2021-10-09 NOTE — Progress Notes (Deleted)
Office Visit Note  Patient: Nancy [name]             Date of Birth: Mar 07, 1956           MRN: [id number]             PCP: Dickie La, MD Referring: Dickie La, MD Visit Date: 10/21/2021   Subjective:  No chief complaint on file.   History of Present Illness: Nancy [name] is a 66 y.o. female here for follow up evaluation of joint pain in multiple areas and abnormal labs concerning for inflammatory arthritis.   Previous HPI 07/10/2021 Nancy [name] is a 66 y.o. female here for evaluation of joint pain in multiple areas and abnormal labs concerning for inflammatory arthritis. She apparently had previous rheumatology assessment with [facility] but not on long term DMARD treatment. Bilateral hand pain affecting her knuckles she sees swelling of MCP joints or PIP joints intermittently. On other days not much morning stiffness and pain. She has ongoing knee pain worst on the left side. She saw Dr. Magnus Ivan with intraarticular steroid injection a week ago and she had improvement but still a lot of active joint pain. Mobility is limited using a walker or more often wheelchair due to pain in her knees while weightbearing. She takes tylenol or occasionally hydrocodone for pain.    Labs reviewed ANA neg SSA >8.0 SSB neg RF 75.1 CCP >250 ESR 38    No Rheumatology ROS completed.   PMFS History:  Patient Active Problem List   Diagnosis Date Noted   Bilateral hand pain 07/10/2021   Rheumatoid factor positive 07/10/2021   High risk medication use 07/10/2021   Hyperlipidemia 04/28/2021   Nocturia 12/16/2020   Asymmetrical sensorineural hearing loss 08/09/2019   Wheelchair dependence 12/17/2016   Polyarthralgia 12/02/2016   Healthcare maintenance 12/18/2014   GERD (gastroesophageal reflux disease) 04/13/2014   Allergic rhinitis 01/24/2014   Morbid obesity with body mass index (BMI) of 40.0 to 44.9 in adult [facility]) 09/14/2013   Abnormal CT of brain 11/19/2011   MACULAR DEGENERATION,  BILATERAL 05/22/2008   Essential hypertension 03/17/2006   SICCA SYNDROME 03/17/2006   KNEE PAIN, CHRONIC 03/17/2006   Chronic low back pain 03/17/2006   Obstructive sleep apnea 09/07/2003   Hypothyroidism 09/07/1998   Microcytic anemia 09/07/1998    Past Medical History:  Diagnosis Date   Acute cholecystitis 01/21/2021   AKI (acute kidney injury) (HCC) 12/04/2018   Back pain    chronic low back pain L4-5 discectomy:11/99. degenerative thoracic spondylotic changes 4/07   Chest pain    neg adenosine myoview.   Hypertension    no LVH EKG-7/05, nl M/C ratio 4/07   Hypothyroidism    09/1998   Lower extremity edema    echo EF 55-65% w/o evidence of Dias dysfx.,    Menorrhagia    Microcytic anemia    09/1998. Hb-10.5/MCV 76. needs ferritin to determine ACD vs IDA   Morbid obesity (HCC)    Obstructive sleep apnea    severe 7/05 RD 161 per hr. /CPAP 18cwp   Plantar fasciitis of left foot 12/16/2020   Pneumonia due to COVID-19 virus 12/04/2018   Polyarthralgia    (knee/back/ankle) w/dx of fibromyalgia? given by [facility], knee pain chronic 2/2 obesity, fibromyalgia and Sjogren's.   Primary Sjogren's syndrome (HCC)    anti Ro+;ANA>1:1280 in homogen pattern, negative ds DNA/RF/anti Smith/RNP/C3-4 comp/la/jo1/Scleroderma/centromere, neg HIV/ACE/Hep B/C, nl CXR 2/05, schirmer salivary glandtest not done, symptom rx:eye drops/prednisone/plaquenil referral to  [facility], Dr. Wynelle Cleveland 3/07. saw [name] but stopped 2/2 cost.   Uterine bleeding    Viral URI with cough 11/03/2017    Family History  Problem Relation Age of Onset   Hypertension Mother    Cancer Mother    Cancer Father    Cancer Sister    Ovarian cancer Other        family history   Breast cancer Other        family history in 1st degree relatives.   Thyroid disease Sister    Colon cancer Neg Hx    Stomach cancer Neg Hx    Past Surgical History:  Procedure Laterality Date   CHOLECYSTECTOMY N/A  01/23/2021   Procedure: LAPAROSCOPIC CHOLECYSTECTOMY;  Surgeon: Georganna Skeans, MD;  Location: [place];  Service: General;  Laterality: N/A;   EXAMINATION UNDER ANESTHESIA  11/23/2011   Procedure: EXAM UNDER ANESTHESIA;  Surgeon: Osborne Oman, MD;  Location: [facility];  Service: Gynecology;  Laterality: N/A;   HYSTEROSCOPY WITH D & C  11/23/2011   Procedure: DILATATION AND CURETTAGE /HYSTEROSCOPY;  Surgeon: Osborne Oman, MD;  Location: [facility];  Service: Gynecology;  Laterality: N/A;   LUMBAR FUSION  1999   SHOULDER SURGERY  unk   TONSILLECTOMY     TUBAL LIGATION  1977   WISDOM TOOTH EXTRACTION     Social History   Social History Narrative   Her daughter Varney Biles) often comes with her to visits. She also has a grand daughter and great grand daughter(whose name is [place])   Immunization History  Administered Date(s) Administered   Fluad Quad(high Dose 65+) 12/08/2018, 12/16/2020   Moderna Covid-19 Vaccine Bivalent Booster 25yrs & up 12/28/2020   PFIZER(Purple Top)SARS-COV-2 Vaccination 05/18/2019, 06/12/2019, 12/16/2019   PNEUMOCOCCAL CONJUGATE-20 12/16/2020   Pneumococcal Polysaccharide-23 12/08/2018   Td 04/29/2009, 09/13/2009     Objective: Vital Signs: There were no vitals taken for this visit.   Physical Exam   Musculoskeletal Exam: ***  CDAI Exam: CDAI Score: -- Patient Global: --; Provider Global: -- Swollen: --; Tender: -- Joint Exam 10/21/2021   No joint exam has been documented for this visit   There is currently no information documented on the homunculus. Go to the Rheumatology activity and complete the homunculus joint exam.  Investigation: No additional findings.  Imaging: No results found.  Recent Labs: Lab Results  Component Value Date   WBC 5.3 07/10/2021   HGB 11.0 (L) 07/10/2021   PLT 268 07/10/2021   NA 141 07/21/2021   K 4.5 07/21/2021   CL 100 07/21/2021   CO2 27 07/21/2021   GLUCOSE 94 07/21/2021   BUN 18 07/21/2021   CREATININE 0.83  07/21/2021   BILITOT 0.7 07/10/2021   ALKPHOS 53 01/24/2021   AST 14 07/10/2021   ALT 8 07/10/2021   PROT 7.3 07/10/2021   ALBUMIN 2.8 (L) 01/24/2021   CALCIUM 9.0 07/21/2021   GFRAA >60 09/06/2019    Speciality Comments: No specialty comments available.  Procedures:  No procedures performed Allergies: Amlodipine, Coconut fatty acids, and Naproxen sodium   Assessment / Plan:     Visit Diagnoses: No diagnosis found.  ***  Orders: No orders of the defined types were placed in this encounter.  No orders of the defined types were placed in this encounter.    Follow-Up Instructions: No follow-ups on file.   Bertram Savin, RT  Note - This record has been created using Editor, commissioning.  Chart creation errors have been sought,  but may not always  have been located. Such creation errors do not reflect on  the standard of medical care.

## 2021-10-21 ENCOUNTER — Ambulatory Visit: Payer: Medicare PPO | Attending: Internal Medicine | Admitting: Internal Medicine

## 2021-10-21 DIAGNOSIS — Z79899 Other long term (current) drug therapy: Secondary | ICD-10-CM

## 2021-10-21 DIAGNOSIS — H04123 Dry eye syndrome of bilateral lacrimal glands: Secondary | ICD-10-CM

## 2021-10-21 DIAGNOSIS — R768 Other specified abnormal immunological findings in serum: Secondary | ICD-10-CM

## 2021-10-21 DIAGNOSIS — M3500 Sicca syndrome, unspecified: Secondary | ICD-10-CM

## 2021-10-21 DIAGNOSIS — Z993 Dependence on wheelchair: Secondary | ICD-10-CM

## 2021-10-21 DIAGNOSIS — M2550 Pain in unspecified joint: Secondary | ICD-10-CM

## 2021-10-21 DIAGNOSIS — M79641 Pain in right hand: Secondary | ICD-10-CM

## 2021-10-21 DIAGNOSIS — H3530 Unspecified macular degeneration: Secondary | ICD-10-CM

## 2021-11-21 ENCOUNTER — Ambulatory Visit (INDEPENDENT_AMBULATORY_CARE_PROVIDER_SITE_OTHER): Payer: Medicare PPO

## 2021-11-21 DIAGNOSIS — Z0000 Encounter for general adult medical examination without abnormal findings: Secondary | ICD-10-CM

## 2021-11-21 NOTE — Progress Notes (Signed)
Unable to reach pt for AWV

## 2021-11-21 NOTE — Patient Instructions (Signed)

## 2021-12-04 ENCOUNTER — Other Ambulatory Visit (HOSPITAL_COMMUNITY): Payer: Self-pay

## 2021-12-04 ENCOUNTER — Other Ambulatory Visit: Payer: Self-pay

## 2021-12-04 DIAGNOSIS — M25562 Pain in left knee: Secondary | ICD-10-CM

## 2021-12-04 MED ORDER — HYDROCODONE-ACETAMINOPHEN 5-325 MG PO TABS
1.0000 | ORAL_TABLET | Freq: Two times a day (BID) | ORAL | 0 refills | Status: DC | PRN
  Filled 2021-12-04: qty 30, 15d supply, fill #0

## 2021-12-04 NOTE — Telephone Encounter (Signed)
Called Nancy [name] to review refill request for hydrocodone as she has not filled since 06/2021. She notes that the knee injection she received with Nancy [name] in April was helpful but eventually wore off and she is not interested in another. She is unable to describe why she is hesitant other than she doesn't want to keep getting injections. We discussed safety and opioid-sparing nature of injections however she declines at this time. She takes hydrocodone only when pain in knees is very severe, usually more during the fall and winter months when the weather gets bad. We discussed decreasing her number of pills to 30/month, one tablet bid prn, and she was agreeable. She has not followed up with Nancy [name], rheumatology, whom evaluated her in 07/2021 and felt there may be an inflammatory component to her arthritis and recommended DMARD therapy. Nancy [name] is hesitant to f/u given her wish to avoid more medications, however I provided education on inflammatory arthritis and encouraged f/u to continue discussion. F/u scheduled with me 10/16.

## 2021-12-05 ENCOUNTER — Other Ambulatory Visit (HOSPITAL_COMMUNITY): Payer: Self-pay

## 2021-12-22 ENCOUNTER — Other Ambulatory Visit: Payer: Self-pay

## 2021-12-22 ENCOUNTER — Other Ambulatory Visit (HOSPITAL_COMMUNITY): Payer: Self-pay

## 2021-12-22 ENCOUNTER — Telehealth: Payer: Self-pay | Admitting: *Deleted

## 2021-12-22 ENCOUNTER — Encounter: Payer: Self-pay | Admitting: Internal Medicine

## 2021-12-22 ENCOUNTER — Ambulatory Visit (INDEPENDENT_AMBULATORY_CARE_PROVIDER_SITE_OTHER): Payer: Medicare PPO | Admitting: Internal Medicine

## 2021-12-22 VITALS — BP 204/92 | HR 81 | Temp 97.9°F | Ht 63.0 in | Wt 257.8 lb

## 2021-12-22 DIAGNOSIS — E785 Hyperlipidemia, unspecified: Secondary | ICD-10-CM | POA: Diagnosis not present

## 2021-12-22 DIAGNOSIS — E039 Hypothyroidism, unspecified: Secondary | ICD-10-CM | POA: Diagnosis not present

## 2021-12-22 DIAGNOSIS — M25562 Pain in left knee: Secondary | ICD-10-CM

## 2021-12-22 DIAGNOSIS — D509 Iron deficiency anemia, unspecified: Secondary | ICD-10-CM | POA: Diagnosis not present

## 2021-12-22 DIAGNOSIS — K047 Periapical abscess without sinus: Secondary | ICD-10-CM

## 2021-12-22 DIAGNOSIS — K08409 Partial loss of teeth, unspecified cause, unspecified class: Secondary | ICD-10-CM | POA: Insufficient documentation

## 2021-12-22 DIAGNOSIS — I10 Essential (primary) hypertension: Secondary | ICD-10-CM

## 2021-12-22 HISTORY — DX: Partial loss of teeth, unspecified cause, unspecified class: K08.409

## 2021-12-22 MED ORDER — METOPROLOL SUCCINATE ER 50 MG PO TB24: 50.0000 mg | ORAL_TABLET | Freq: Every day | ORAL | 3 refills | Status: DC

## 2021-12-22 MED ORDER — CHLORTHALIDONE 25 MG PO TABS
25.0000 mg | ORAL_TABLET | Freq: Every day | ORAL | 0 refills | Status: DC
  Filled 2021-12-22: qty 30, 30d supply, fill #0

## 2021-12-22 MED ORDER — AMOXICILLIN-POT CLAVULANATE 875-125 MG PO TABS
1.0000 | ORAL_TABLET | Freq: Two times a day (BID) | ORAL | 0 refills | Status: DC
  Filled 2021-12-22: qty 14, 7d supply, fill #0

## 2021-12-22 MED ORDER — AMLODIPINE BESYLATE 5 MG PO TABS: 5.0000 mg | ORAL_TABLET | Freq: Every day | ORAL | 3 refills | Status: DC

## 2021-12-22 MED ORDER — AMOXICILLIN-POT CLAVULANATE 875-125 MG PO TABS: 1.0000 | ORAL_TABLET | Freq: Two times a day (BID) | ORAL | 0 refills | Status: DC

## 2021-12-22 MED ORDER — AMLODIPINE BESYLATE 5 MG PO TABS
5.0000 mg | ORAL_TABLET | Freq: Every day | ORAL | 0 refills | Status: DC
  Filled 2021-12-22 (×2): qty 30, 30d supply, fill #0

## 2021-12-22 MED ORDER — CHLORTHALIDONE 25 MG PO TABS: 25.0000 mg | ORAL_TABLET | Freq: Every day | ORAL | 3 refills | Status: DC

## 2021-12-22 MED ORDER — LEVOTHYROXINE SODIUM 50 MCG PO TABS: 50.0000 ug | ORAL_TABLET | Freq: Every day | ORAL | 3 refills | Status: DC

## 2021-12-22 NOTE — Progress Notes (Unsigned)
Established Patient Office Visit  Subjective   Patient ID: Nancy [name], female    DOB: 11-12-1955  Age: 66 y.o. MRN: [id number]  Chief Complaint  Patient presents with   Follow-up    Check BP.   Left knee pain   Cavities    Nancy [name] is a 66 yo person living with hypertension, obesity, chronic pain, positive RF who returns to clinic today for follow-up of chronic conditions and to discuss dental pain. Her last visit with me was 07/2021.  For details of today's visit, please see assessment/plan in problem-based charting.   Patient Active Problem List   Diagnosis Date Noted   Dental infection 12/22/2021   Bilateral hand pain 07/10/2021   Rheumatoid factor positive 07/10/2021   Hyperlipidemia 04/28/2021   Nocturia 12/16/2020   Asymmetrical sensorineural hearing loss 08/09/2019   Wheelchair dependence 12/17/2016   Polyarthralgia 12/02/2016   GERD (gastroesophageal reflux disease) 04/13/2014   Allergic rhinitis 01/24/2014   Morbid obesity with body mass index (BMI) of 40.0 to 44.9 in adult [facility]) 09/14/2013   Abnormal CT of brain 11/19/2011   MACULAR DEGENERATION, BILATERAL 05/22/2008   Essential hypertension 03/17/2006   SICCA SYNDROME 03/17/2006   KNEE PAIN, CHRONIC 03/17/2006   Chronic low back pain 03/17/2006   Obstructive sleep apnea 09/07/2003   Hypothyroidism 09/07/1998   Microcytic anemia 09/07/1998    Review of Systems  Constitutional:  Negative for fever.  HENT:         Mouth/jaw pain  Respiratory:  Negative for shortness of breath.   Cardiovascular:  Negative for chest pain.  Musculoskeletal:  Positive for joint pain.      Objective:     BP (!) 204/92 (BP Location: Left Arm, Patient Position: Sitting, Cuff Size: Large)   Pulse 81   Temp 97.9 F (36.6 C) (Oral)   Ht _0  (1.6 m)   Wt 257 lb 12.8 oz (116.9 kg)   SpO2 98% Comment: RA  BMI 45.67 kg/m  BP Readings from Last 3 Encounters:  12/22/21 (!) 204/92  07/21/21 (!) 168/82  07/10/21 (!)  180/102   Wt Readings from Last 3 Encounters:  12/22/21 257 lb 12.8 oz (116.9 kg)  07/21/21 249 lb 3.2 oz (113 kg)  07/10/21 247 lb 6.4 oz (112.2 kg)     Physical Exam Constitutional:      Appearance: Normal appearance. She is obese. She is ill-appearing (appears tired). She is not toxic-appearing.  HENT:     Mouth/Throat:     Mouth: Mucous membranes are moist.     Comments: Broken left molar. TTP with retraction of left buccal mucosa, TTP over adjacent left mandible. No clear evidence of purulence or abscess Cardiovascular:     Rate and Rhythm: Normal rate and regular rhythm.     Heart sounds: Normal heart sounds.  Pulmonary:     Effort: Pulmonary effort is normal.     Breath sounds: Normal breath sounds.  Musculoskeletal:     Comments: Sitting in wheelchair  Neurological:     General: No focal deficit present.     Mental Status: She is alert and oriented to person, place, and time.  Psychiatric:        Behavior: Behavior normal.        Thought Content: Thought content normal.     Comments: tearful    Results for orders placed or performed in visit on 12/22/21  Lipid Profile  Result Value Ref Range   Cholesterol, Total 195 100 -  199 mg/dL   Triglycerides 125 0 - 149 mg/dL   HDL 59 >39 mg/dL   VLDL Cholesterol Cal 22 5 - 40 mg/dL   LDL Chol Calc (NIH) 114 (H) 0 - 99 mg/dL   Chol/HDL Ratio 3.3 0.0 - 4.4 ratio  BMP8+Anion Gap  Result Value Ref Range   Glucose 118 (H) 70 - 99 mg/dL   BUN 11 8 - 27 mg/dL   Creatinine, Ser 0.80 0.57 - 1.00 mg/dL   eGFR 80 >59 mL/min/1.73   BUN/Creatinine Ratio 14 12 - 28   Sodium 139 134 - 144 mmol/L   Potassium 5.0 3.5 - 5.2 mmol/L   Chloride 101 96 - 106 mmol/L   CO2 25 20 - 29 mmol/L   Anion Gap 13.0 10.0 - 18.0 mmol/L   Calcium 8.9 8.7 - 10.3 mg/dL  CBC no Diff  Result Value Ref Range   WBC 6.0 3.4 - 10.8 x10E3/uL   RBC 4.92 3.77 - 5.28 x10E6/uL   Hemoglobin 11.3 11.1 - 15.9 g/dL   Hematocrit 36.7 34.0 - 46.6 %   MCV 75 (L)  79 - 97 fL   MCH 23.0 (L) 26.6 - 33.0 pg   MCHC 30.8 (L) 31.5 - 35.7 g/dL   RDW 14.8 11.7 - 15.4 %   Platelets 290 150 - 450 x10E3/uL  Iron, TIBC and Ferritin Panel  Result Value Ref Range   Total Iron Binding Capacity 255 250 - 450 ug/dL   UIBC 219 118 - 369 ug/dL   Iron 36 27 - 139 ug/dL   Iron Saturation 14 (L) 15 - 55 %   Ferritin 85 15 - 150 ng/mL  TSH  Result Value Ref Range   TSH 3.180 0.450 - 4.500 uIU/mL      Assessment & Plan:   Problem List Items Addressed This Visit       Cardiovascular and Mediastinum   Essential hypertension - Primary    Blood pressure significantly elevated today at 187/92 , worsened on recheck (204/92) after physical exam likely secondary to increased pain from dental infection. She did take her medications prior to this visit, however we reviewed her medications and she has only been taking losartan and metoprolol. Her fill history showed past due refills for chlorthalidone and amlodipine and she does not think she has been taking these for some time. We have discussed resuming these medications, short-term prescription sent to [facility]  in order to start therapy today with longer-term prescription sent to preferred pharmacy at [facility]. Will schedule short-term f/u to monitor BP. Plan -Continue losartan 100 mg and metoprolol XL 50 mg daily -Resume amlodipine 5 mg and chlorthalidone 25 mg daily -BMP today -Return in two weeks for BP check      Relevant Medications   metoprolol succinate (TOPROL-XL) 50 MG 24 hr tablet   chlorthalidone (HYGROTON) 25 MG tablet   chlorthalidone (HYGROTON) 25 MG tablet (Start on 01/22/2022)   amLODipine (NORVASC) 5 MG tablet (Start on 01/22/2022)   amLODipine (NORVASC) 5 MG tablet   Other Relevant Orders   BMP8+Anion Gap (Completed)     Digestive   Dental infection    Nancy [name] has been experiencing left lower jaw pain for the last two weeks secondary to poor dentition and a cracked left molar. She has not yet  seen a dentist. Exam with significant TTP over the left mandible adjacent to the affected left molar. No active purulence or visible abscess but given significant pain and likely source, I have  prescribed 7 day course of Augmentin for suspected dental infection. Strongly encouraged establishment with a dentist. I have asked Nancy [name] to call her insurance to find in-network providers and to call them today. I have made a referral to dentistry in case she is not successful. Suspect she will need tooth extraction. Plan -Augmentin 875-125 bid for 7 days -Return precautions for fevers or worsening symptoms -Referral to dentistry      Relevant Medications   amoxicillin-clavulanate (AUGMENTIN) 875-125 MG tablet   Other Relevant Orders   Ambulatory referral to Dentistry     Endocrine   Hypothyroidism    Continues on levothyroxine 50 mcg daily. Due for annual TSH. Plan -TSH -Continue levo 50 mcg      Relevant Medications   metoprolol succinate (TOPROL-XL) 50 MG 24 hr tablet   levothyroxine (SYNTHROID) 50 MCG tablet   Other Relevant Orders   TSH (Completed)     Other   Microcytic anemia    History of chronic microcytic anemia, thought likely secondary to anemia of chronic disease. Ms. Art notes she has been taking iron supplementation as her husband thought this might help her fatigue. Previous iron studies not consistent with IDA, will recheck today. Hgb electrophoresis negative for beta-thalassemia, will check for alpha today. Plan -Iron studies -CBC -Alpha-thal testing      Relevant Orders   CBC no Diff (Completed)   Iron, TIBC and Ferritin Panel (Completed)   Alpha-Thalassemia GenotypR   KNEE PAIN, CHRONIC    Chronic left knee pain 2/2 OA +/- inflammatory arthritis. Had some relief from steroid injection in April 2023, however she does not wish to have further injections as she does not think it lasted long enough. She remains on controlled medication agreement with  hydrocodone-acetaminophen 5-325 mg, however she continues to use sparingly. We have recently agreed to reduce her pill count to #30 with dosing bid prn. She is mostly wheelchair bound at this point due to significant pain, unable to have knee replacement given BMI. She had been using a power wheelchair but notes that it isn't working as well. I have encouraged her to contact her medical supply company. I have also recommended she reschedule f/u with Dr. Benjamine Mola in rheumatology to assess need for DMARD therapy for possible inflammatory arthritis contributing to her chronic pain. Plan -Continue hydrocodone-acetaminophen 5-235 mg bid prn for severe pain -Return if interested in further knee injections      Hyperlipidemia    Lipid panel to assess response to increased dose of atorvastatin to 80 mg.      Relevant Medications   metoprolol succinate (TOPROL-XL) 50 MG 24 hr tablet   chlorthalidone (HYGROTON) 25 MG tablet   chlorthalidone (HYGROTON) 25 MG tablet (Start on 01/22/2022)   amLODipine (NORVASC) 5 MG tablet (Start on 01/22/2022)   amLODipine (NORVASC) 5 MG tablet   Other Relevant Orders   Lipid Profile (Completed)    Return in about 2 weeks (around 01/05/2022) for f/u BP.    Charise Killian, MD

## 2021-12-22 NOTE — Telephone Encounter (Signed)
Call from patient who stated that she  got call from Pharmacy that she is Allergic to Amlodipine.  Patient does have it as a listed allergy.  Spoke with Pharmacy and Dr. Saverio Danker.  Change to Norvasc.  Unable to change to Norvasc as it is no longer available.  Informed Dr. Saverio Danker of. Amlodipine is out of stock.   Expect delivery of by tomorrow.  Patient was informed of and will pick up Amlodipine and will contact office if she has problems after taking the medication.

## 2021-12-22 NOTE — Progress Notes (Signed)
Dr Zenia Resides office called for last OV's notes. Pt is calling [facility] for Flu and COVID records.

## 2021-12-22 NOTE — Patient Instructions (Addendum)
It was a pleasure to see you today!  Please call the number on the back of your insurance/Medicare card to find dentists that are "in-network". Please call them today to see if you can get an appointment for your teeth soon. I have prescribed an antibiotic, Augmentin, to take twice daily for 7 days for dental infection.  For your blood pressure, you should be taking the following medications: losartan 100 mg, amlodipine 5 mg, chlorthalidone 25 mg, and metoprolol 50 mg every day. I have refilled your prescriptions and will see you back in ONE month in clinic to check on blood pressure.  Please call the rheumatologist (arthritis doctor) at [phone]  to schedule a follow-up appointment to talk about medications for your arthritis. This may help with your pain! If you change your mind about another knee injection, please let me know.

## 2021-12-22 NOTE — Telephone Encounter (Signed)
Amlodipine listed under allergies with effect of "lower extremity swelling". This is a known side effect of amlodipine and not a true allergy or contraindication. Nancy [name] has taken amlodipine earlier this year. Will resume amlodipine 5 mg and monitor for side effects.

## 2021-12-23 ENCOUNTER — Other Ambulatory Visit (HOSPITAL_COMMUNITY): Payer: Self-pay

## 2021-12-23 ENCOUNTER — Other Ambulatory Visit: Payer: Self-pay

## 2021-12-23 LAB — CBC
Hematocrit: 36.7 % (ref 34.0–46.6)
Hemoglobin: 11.3 g/dL (ref 11.1–15.9)
MCH: 23 pg — ABNORMAL LOW (ref 26.6–33.0)
MCHC: 30.8 g/dL — ABNORMAL LOW (ref 31.5–35.7)
MCV: 75 fL — ABNORMAL LOW (ref 79–97)
Platelets: 290 10*3/uL (ref 150–450)
RBC: 4.92 x10E6/uL (ref 3.77–5.28)
RDW: 14.8 % (ref 11.7–15.4)
WBC: 6 10*3/uL (ref 3.4–10.8)

## 2021-12-23 LAB — TSH: TSH: 3.18 u[IU]/mL (ref 0.450–4.500)

## 2021-12-23 LAB — BMP8+ANION GAP
Anion Gap: 13 mmol/L (ref 10.0–18.0)
BUN/Creatinine Ratio: 14 (ref 12–28)
BUN: 11 mg/dL (ref 8–27)
CO2: 25 mmol/L (ref 20–29)
Calcium: 8.9 mg/dL (ref 8.7–10.3)
Chloride: 101 mmol/L (ref 96–106)
Creatinine, Ser: 0.8 mg/dL (ref 0.57–1.00)
Glucose: 118 mg/dL — ABNORMAL HIGH (ref 70–99)
Potassium: 5 mmol/L (ref 3.5–5.2)
Sodium: 139 mmol/L (ref 134–144)
eGFR: 80 mL/min/{1.73_m2}

## 2021-12-23 LAB — LIPID PANEL
Chol/HDL Ratio: 3.3 ratio (ref 0.0–4.4)
Cholesterol, Total: 195 mg/dL (ref 100–199)
HDL: 59 mg/dL
LDL Chol Calc (NIH): 114 mg/dL — ABNORMAL HIGH (ref 0–99)
Triglycerides: 125 mg/dL (ref 0–149)
VLDL Cholesterol Cal: 22 mg/dL (ref 5–40)

## 2021-12-23 LAB — IRON,TIBC AND FERRITIN PANEL
Ferritin: 85 ng/mL (ref 15–150)
Iron Saturation: 14 % — ABNORMAL LOW (ref 15–55)
Iron: 36 ug/dL (ref 27–139)
Total Iron Binding Capacity: 255 ug/dL (ref 250–450)
UIBC: 219 ug/dL (ref 118–369)

## 2021-12-23 NOTE — Assessment & Plan Note (Signed)
History of chronic microcytic anemia, thought likely secondary to anemia of chronic disease. Nancy [name] notes she has been taking iron supplementation as her husband thought this might help her fatigue. Previous iron studies not consistent with IDA, will recheck today. Hgb electrophoresis negative for beta-thalassemia, will check for alpha today. Plan -Iron studies -CBC -Alpha-thal testing

## 2021-12-23 NOTE — Assessment & Plan Note (Signed)
Lipid panel to assess response to increased dose of atorvastatin to 80 mg.

## 2021-12-23 NOTE — Assessment & Plan Note (Signed)
Blood pressure significantly elevated today at 187/92 , worsened on recheck (204/92) after physical exam likely secondary to increased pain from dental infection. She did take her medications prior to this visit, however we reviewed her medications and she has only been taking losartan and metoprolol. Her fill history showed past due refills for chlorthalidone and amlodipine and she does not think she has been taking these for some time. We have discussed resuming these medications, short-term prescription sent to [facility]  in order to start therapy today with longer-term prescription sent to preferred pharmacy at [facility]. Will schedule short-term f/u to monitor BP. Plan -Continue losartan 100 mg and metoprolol XL 50 mg daily -Resume amlodipine 5 mg and chlorthalidone 25 mg daily -BMP today -Return in two weeks for BP check

## 2021-12-23 NOTE — Assessment & Plan Note (Signed)
Continues on levothyroxine 50 mcg daily. Due for annual TSH. Plan -TSH -Continue levo 50 mcg

## 2021-12-23 NOTE — Assessment & Plan Note (Signed)
Nancy [name] has been experiencing left lower jaw pain for the last two weeks secondary to poor dentition and a cracked left molar. She has not yet seen a dentist. Exam with significant TTP over the left mandible adjacent to the affected left molar. No active purulence or visible abscess but given significant pain and likely source, I have prescribed 7 day course of Augmentin for suspected dental infection. Strongly encouraged establishment with a dentist. I have asked [name] to call her insurance to find in-network providers and to call them today. I have made a referral to dentistry in case she is not successful. Suspect she will need tooth extraction. Plan -Augmentin 875-125 bid for 7 days -Return precautions for fevers or worsening symptoms -Referral to dentistry

## 2021-12-23 NOTE — Assessment & Plan Note (Addendum)
Chronic left knee pain 2/2 OA +/- inflammatory arthritis. Had some relief from steroid injection in April 2023, however she does not wish to have further injections as she does not think it lasted long enough. She remains on controlled medication agreement with hydrocodone-acetaminophen 5-325 mg, however she continues to use sparingly. We have recently agreed to reduce her pill count to #30 with dosing bid prn. She is mostly wheelchair bound at this point due to significant pain, unable to have knee replacement given BMI. She had been using a power wheelchair but notes that it isn't working as well. I have encouraged her to contact her medical supply company. I have also recommended she reschedule f/u with Dr. Benjamine Mola in rheumatology to assess need for DMARD therapy for possible inflammatory arthritis contributing to her chronic pain. Plan -Continue hydrocodone-acetaminophen 5-235 mg bid prn for severe pain -Return if interested in further knee injections

## 2021-12-24 ENCOUNTER — Telehealth: Payer: Self-pay | Admitting: Internal Medicine

## 2021-12-24 NOTE — Telephone Encounter (Signed)
Please refer to message below.  Just spoke with patient and she was agreeable to coming in next Monday 12/29/21 at 11:15 am to see Dr. Angelyn Punt.

## 2021-12-24 NOTE — Telephone Encounter (Signed)
-----   Message from Charise Killian, MD sent at 12/23/2021 10:13 AM EDT ----- Regarding: F/u BP Good morning. Can we try to schedule Ms. Feliz next Monday, 10/23, for f/u with me for BP? It is okay to overbook her at 11:15 if she can make it.   Thank you! Charise Killian

## 2021-12-29 ENCOUNTER — Ambulatory Visit (INDEPENDENT_AMBULATORY_CARE_PROVIDER_SITE_OTHER): Payer: Medicare PPO | Admitting: Internal Medicine

## 2021-12-29 ENCOUNTER — Other Ambulatory Visit (HOSPITAL_COMMUNITY): Payer: Self-pay

## 2021-12-29 VITALS — BP 161/91 | HR 68 | Temp 97.9°F | Wt 251.6 lb

## 2021-12-29 DIAGNOSIS — E785 Hyperlipidemia, unspecified: Secondary | ICD-10-CM | POA: Diagnosis not present

## 2021-12-29 DIAGNOSIS — M25562 Pain in left knee: Secondary | ICD-10-CM

## 2021-12-29 DIAGNOSIS — I10 Essential (primary) hypertension: Secondary | ICD-10-CM | POA: Diagnosis not present

## 2021-12-29 MED ORDER — AMLODIPINE BESYLATE 10 MG PO TABS: 10.0000 mg | ORAL_TABLET | Freq: Every day | ORAL | 3 refills | Status: DC

## 2021-12-29 MED ORDER — AMLODIPINE BESYLATE 10 MG PO TABS
10.0000 mg | ORAL_TABLET | Freq: Every day | ORAL | 11 refills | Status: DC
  Filled 2021-12-29: qty 30, 30d supply, fill #0

## 2021-12-29 MED ORDER — HYDROCODONE-ACETAMINOPHEN 5-325 MG PO TABS
1.0000 | ORAL_TABLET | Freq: Two times a day (BID) | ORAL | 0 refills | Status: DC | PRN
  Filled 2022-01-02: qty 30, 15d supply, fill #0

## 2021-12-29 NOTE — Assessment & Plan Note (Signed)
PDMP reviewed and appropriate. Refill of hydrocodone-acetaminophen to start 10/27 prescribed today. Rheumatology phone number provided again today for f/u.

## 2021-12-29 NOTE — Progress Notes (Signed)
Established Patient Office Visit  Subjective   Patient ID: Nancy [name], female    DOB: 10-Jun-1955  Age: 66 y.o. MRN: [id number]  Chief Complaint  Patient presents with   Hypertension    BP Check     [name] returns for one week follow-up of blood pressure. She was last seen in clinic by me 10/16 during which her BP was severely elevated while off multiple BP medications. Please see assessment/plan in problem-based charting for further details of today's visit.    Patient Active Problem List   Diagnosis Date Noted   Dental infection 12/22/2021   Bilateral hand pain 07/10/2021   Rheumatoid factor positive 07/10/2021   Hyperlipidemia 04/28/2021   Nocturia 12/16/2020   Asymmetrical sensorineural hearing loss 08/09/2019   Wheelchair dependence 12/17/2016   Polyarthralgia 12/02/2016   GERD (gastroesophageal reflux disease) 04/13/2014   Allergic rhinitis 01/24/2014   Morbid obesity with body mass index (BMI) of 40.0 to 44.9 in adult [place]) 09/14/2013   Abnormal CT of brain 11/19/2011   MACULAR DEGENERATION, BILATERAL 05/22/2008   Essential hypertension 03/17/2006   SICCA SYNDROME 03/17/2006   KNEE PAIN, CHRONIC 03/17/2006   Chronic low back pain 03/17/2006   Obstructive sleep apnea 09/07/2003   Hypothyroidism 09/07/1998   Microcytic anemia 09/07/1998   Review of Systems  Respiratory:  Negative for shortness of breath.   Cardiovascular:  Negative for chest pain and leg swelling.  Neurological:  Negative for dizziness.      Objective:     BP (!) 161/91 (BP Location: Left Arm, Patient Position: Sitting, Cuff Size: Large)   Pulse 68   Temp 97.9 F (36.6 C) (Oral)   Wt 251 lb 9.6 oz (114.1 kg)   SpO2 99%   BMI 44.57 kg/m  BP Readings from Last 3 Encounters:  12/29/21 (!) 161/91  12/22/21 (!) 204/92  07/21/21 (!) 168/82   Wt Readings from Last 3 Encounters:  12/29/21 251 lb 9.6 oz (114.1 kg)  12/22/21 257 lb 12.8 oz (116.9 kg)  07/21/21 249 lb 3.2 oz (113 kg)       Physical Exam Constitutional:      General: She is not in acute distress.    Appearance: Normal appearance. She is not ill-appearing.  Cardiovascular:     Rate and Rhythm: Normal rate and regular rhythm.     Heart sounds: Normal heart sounds.  Pulmonary:     Effort: Pulmonary effort is normal.     Breath sounds: Normal breath sounds.  Musculoskeletal:        General: No swelling.  Neurological:     Mental Status: She is alert.      Assessment & Plan:   Problem List Items Addressed This Visit       Cardiovascular and Mediastinum   Essential hypertension    Blood pressure improved today at 161/91 after resuming chlorthalidone and amlodipine, although remains above goal <130/80. Ms. Viles is now taking losartan 100 mg, metoprolol 50 mg, chlorthalidone 25 mg, and amlodipine 5 mg daily. She has taken medications this morning. We discussed increasing amlodipine to 10 mg daily. She denies any side effects from this medication including lower extremity edema. She requests medications be sent to [facility] which I have done, canceled previous prescriptions at [facility].  Plan -Continue losartan 100 mg, metoprolol XL 50 mg, chlorthalidone 25 mg -Increase amlodipine to 10 mg daily -Return in one month for BP f/u and BMP      Relevant Medications   amLODipine (NORVASC)  10 MG tablet     Other   KNEE PAIN, CHRONIC    PDMP reviewed and appropriate. Refill of hydrocodone-acetaminophen to start 10/27 prescribed today. Rheumatology phone number provided again today for f/u.      Relevant Medications   HYDROcodone-acetaminophen (NORCO/VICODIN) 5-325 MG tablet (Start on 01/02/2022)   Hyperlipidemia - Primary    Minimal improvement in LDL with increase to atorvastatin 80 mg daily. Ms. Strieter reports adherence. We discussed addition of ezetimibe for further LDL lowering and ASCVD risk reduction, however Ms. Mcsorley is resistant to more medications today. We discussed dietary changes including  reducing fried foods, processed foods, sugar intake, and red meat which she seemed motivated to do to avoid medications. Will plan to repeat at f/u visit in 2-3 months and consider further medication requirement at that time.  Plan -Dietary counseling provided -Repeat lipid panel in 2-3 months      Relevant Medications   amLODipine (NORVASC) 10 MG tablet    Return in about 4 weeks (around 01/26/2022) for f/u BP.    Charise Killian, MD

## 2021-12-29 NOTE — Assessment & Plan Note (Signed)
Blood pressure improved today at 161/91 after resuming chlorthalidone and amlodipine, although remains above goal <130/80. Nancy [name] is now taking losartan 100 mg, metoprolol 50 mg, chlorthalidone 25 mg, and amlodipine 5 mg daily. She has taken medications this morning. We discussed increasing amlodipine to 10 mg daily. She denies any side effects from this medication including lower extremity edema. She requests medications be sent to [facility] which I have done, canceled previous prescriptions at [facility].  Plan -Continue losartan 100 mg, metoprolol XL 50 mg, chlorthalidone 25 mg -Increase amlodipine to 10 mg daily -Return in one month for BP f/u and BMP

## 2021-12-29 NOTE — Patient Instructions (Addendum)
For blood pressure, please increase amlodipine to 10 mg daily. Continue all other blood pressure medications as prescribed.  Please bring all of your medications to the next visit for review!  Please call your pharmacy to schedule shingles (Shingrix) and tetanus (Tdap) vaccines.

## 2021-12-29 NOTE — Assessment & Plan Note (Signed)
Minimal improvement in LDL with increase to atorvastatin 80 mg daily. Nancy [name] reports adherence. We discussed addition of ezetimibe for further LDL lowering and ASCVD risk reduction, however Nancy [name] is resistant to more medications today. We discussed dietary changes including reducing fried foods, processed foods, sugar intake, and red meat which she seemed motivated to do to avoid medications. Will plan to repeat at f/u visit in 2-3 months and consider further medication requirement at that time.  Plan -Dietary counseling provided -Repeat lipid panel in 2-3 months

## 2021-12-31 ENCOUNTER — Other Ambulatory Visit (HOSPITAL_COMMUNITY): Payer: Self-pay

## 2022-01-01 NOTE — Progress Notes (Signed)
Office Visit Note  Patient: Nancy [name]             Date of Birth: 17-Nov-1955           MRN: [id number]             PCP: Charise Killian, MD Referring: Charise Killian, MD Visit Date: 01/12/2022   Subjective:  Follow-up (Severe left knee pain. )   History of Present Illness: Nancy [name] is a 66 y.o. female here for follow up for evaluation of joint pain in multiple areas and abnormal labs concerning for inflammatory arthritis currently with ongoing bilateral hand and knee pain issues are the worst. Lab results at initial visit did show persistent sedimentation rate elevation and suspicious for inflammatory pain component. Left knee was injected with Dr. Ninfa Linden after last visit with temporary improvement. She is currently in wheelchair while out and about, also using walker for support at home and feels knee is giving out or locking up occasionally.  Previous HPI 07/10/2021  Nancy [name] is a 66 y.o. female here for evaluation of joint pain in multiple areas and abnormal labs concerning for inflammatory arthritis. She apparently had previous rheumatology assessment with [facility] but not on long term DMARD treatment. Bilateral hand pain affecting her knuckles she sees swelling of MCP joints or PIP joints intermittently. On other days not much morning stiffness and pain. She has ongoing knee pain worst on the left side. She saw Dr. Ninfa Linden with intraarticular steroid injection a week ago and she had improvement but still a lot of active joint pain. Mobility is limited using a walker or more often wheelchair due to pain in her knees while weightbearing. She takes tylenol or occasionally hydrocodone for pain.    Labs reviewed ANA neg SSA >8.0 SSB neg RF 75.1 CCP >250 ESR 38   Activities of Daily Living:  Patient reports morning stiffness for several hours.   Patient Reports nocturnal pain.  Difficulty dressing/grooming: Reports Difficulty climbing stairs: Reports Difficulty getting out  of chair: Reports Difficulty using hands for taps, buttons, cutlery, and/or writing: Reports   Review of Systems  Constitutional:  Negative for fatigue.  HENT:  Positive for mouth dryness. Negative for mouth sores.   Eyes:  Positive for dryness.  Respiratory:  Positive for shortness of breath.   Cardiovascular:  Negative for chest pain and palpitations.  Gastrointestinal:  Negative for blood in stool, constipation and diarrhea.  Endocrine: Negative for increased urination.  Genitourinary:  Positive for involuntary urination.  Musculoskeletal:  Positive for joint pain, gait problem, joint pain, joint swelling, myalgias, muscle weakness, morning stiffness, muscle tenderness and myalgias.  Skin:  Negative for color change, rash, hair loss and sensitivity to sunlight.  Allergic/Immunologic: Negative for susceptible to infections.  Neurological:  Positive for headaches. Negative for dizziness.  Hematological:  Negative for swollen glands.  Psychiatric/Behavioral:  Positive for depressed mood. Negative for sleep disturbance. The patient is not nervous/anxious.     PMFS History:  Patient Active Problem List   Diagnosis Date Noted   Dental infection 12/22/2021   Bilateral hand pain 07/10/2021   Rheumatoid factor positive 07/10/2021   Hyperlipidemia 04/28/2021   Nocturia 12/16/2020   Asymmetrical sensorineural hearing loss 08/09/2019   Wheelchair dependence 12/17/2016   Polyarthralgia 12/02/2016   GERD (gastroesophageal reflux disease) 04/13/2014   Allergic rhinitis 01/24/2014   Morbid obesity with body mass index (BMI) of 40.0 to 44.9 in adult [facility]) 09/14/2013   Abnormal CT of brain  11/19/2011   MACULAR DEGENERATION, BILATERAL 05/22/2008   Essential hypertension 03/17/2006   SICCA SYNDROME 03/17/2006   KNEE PAIN, CHRONIC 03/17/2006   Chronic low back pain 03/17/2006   Obstructive sleep apnea 09/07/2003   Hypothyroidism 09/07/1998   Microcytic anemia 09/07/1998    Past Medical  History:  Diagnosis Date   Acute cholecystitis 01/21/2021   AKI (acute kidney injury) ([place]) 12/04/2018   Back pain    chronic low back pain L4-5 discectomy:11/99. degenerative thoracic spondylotic changes 4/07   Chest pain    neg adenosine myoview.   Hypertension    no LVH EKG-7/05, nl M/C ratio 4/07   Hypothyroidism    09/1998   Lower extremity edema    echo EF 55-65% w/o evidence of Dias dysfx.,    Menorrhagia    Microcytic anemia    09/1998. Hb-10.5/MCV 76. needs ferritin to determine ACD vs IDA   Morbid obesity ([place])    Obstructive sleep apnea    severe 7/05 RD 161 per hr. /CPAP 18cwp   Plantar fasciitis of left foot 12/16/2020   Pneumonia due to COVID-19 virus 12/04/2018   Polyarthralgia    (knee/back/ankle) w/dx of fibromyalgia? given by [facility], knee pain chronic 2/2 obesity, fibromyalgia and Sjogren's.   Primary Sjogren's syndrome ([place])    anti Ro+;ANA>1:1280 in homogen pattern, negative ds DNA/RF/anti Smith/RNP/C3-4 comp/la/jo1/Scleroderma/centromere, neg HIV/ACE/Hep B/C, nl CXR 2/05, schirmer salivary glandtest not done, symptom rx:eye drops/prednisone/plaquenil referral to [facility] rheum, Dr. Wynelle Cleveland 3/07. saw [name] but stopped 2/2 cost.   Uterine bleeding    Viral URI with cough 11/03/2017    Family History  Problem Relation Age of Onset   Hypertension Mother    Cancer Mother    Cancer Father    Cancer Sister    Ovarian cancer Other        family history   Breast cancer Other        family history in 1st degree relatives.   Thyroid disease Sister    Colon cancer Neg Hx    Stomach cancer Neg Hx    Past Surgical History:  Procedure Laterality Date   CHOLECYSTECTOMY N/A 01/23/2021   Procedure: LAPAROSCOPIC CHOLECYSTECTOMY;  Surgeon: Georganna Skeans, MD;  Location: [place];  Service: General;  Laterality: N/A;   EXAMINATION UNDER ANESTHESIA  11/23/2011   Procedure: EXAM UNDER ANESTHESIA;  Surgeon: Osborne Oman, MD;  Location: [place] [facility];  Service:  Gynecology;  Laterality: N/A;   HYSTEROSCOPY WITH D & C  11/23/2011   Procedure: DILATATION AND CURETTAGE /HYSTEROSCOPY;  Surgeon: Osborne Oman, MD;  Location: [place] [facility];  Service: Gynecology;  Laterality: N/A;   LUMBAR FUSION  1999   SHOULDER SURGERY  unk   TONSILLECTOMY     TUBAL LIGATION  1977   WISDOM TOOTH EXTRACTION     Social History   Social History Narrative   Her daughter Varney Biles) often comes with her to visits. She also has a grand daughter and great grand daughter(whose name is Investment banker, operational)   Immunization History  Administered Date(s) Administered   Fluad Quad(high Dose 65+) 12/08/2018, 12/16/2020   Moderna Covid-19 Vaccine Bivalent Booster 13yr & up 12/28/2020   PFIZER(Purple Top)SARS-COV-2 Vaccination 05/18/2019, 06/12/2019, 12/16/2019   PNEUMOCOCCAL CONJUGATE-20 12/16/2020   Pneumococcal Polysaccharide-23 12/08/2018   Td 04/29/2009, 09/13/2009     Objective: Vital Signs: BP (!) 161/85 (BP Location: Left Arm, Patient Position: Sitting, Cuff Size: Normal)   Pulse 78   Resp 15   Ht 5' 3"  (1.6 m)  BMI 44.57 kg/m    Physical Exam Constitutional:      Appearance: She is obese.     Comments: In wheelchair  Cardiovascular:     Rate and Rhythm: Normal rate and regular rhythm.  Pulmonary:     Effort: Pulmonary effort is normal.     Breath sounds: Normal breath sounds.  Skin:    General: Skin is warm and dry.  Neurological:     Mental Status: She is alert.  Psychiatric:        Mood and Affect: Mood normal.      Musculoskeletal Exam:  Shoulders full ROM no tenderness or swelling Elbows full ROM no tenderness or swelling Wrists full ROM no tenderness or swelling Fingers full ROM MCP joint thickening or swelling on dorsal side, pain at MCP and PIPs with flexion Left knee very tender on medial side of joint, no palpable effusion, less tenderness on lateral or anterior side Ankles full ROM no tenderness or swelling    Investigation: No additional  findings.  Imaging: No results found.  Recent Labs: Lab Results  Component Value Date   WBC 6.0 12/22/2021   HGB 11.3 12/22/2021   PLT 290 12/22/2021   NA 139 12/22/2021   K 5.0 12/22/2021   CL 101 12/22/2021   CO2 25 12/22/2021   GLUCOSE 118 (H) 12/22/2021   BUN 11 12/22/2021   CREATININE 0.80 12/22/2021   BILITOT 0.7 07/10/2021   ALKPHOS 53 01/24/2021   AST 14 07/10/2021   ALT 8 07/10/2021   PROT 7.3 07/10/2021   ALBUMIN 2.8 (L) 01/24/2021   CALCIUM 8.9 12/22/2021   GFRAA >60 09/06/2019    Speciality Comments: No specialty comments available.  Procedures:  No procedures performed Allergies: Amlodipine, Coconut fatty acids, and Naproxen sodium   Assessment / Plan:     Visit Diagnoses: Rheumatoid factor positive SICCA SYNDROME Polyarthralgia  There is only small joint effusions present and does have underlying structural abnormalities that could be contributing with osteoarthritis in the hands and knee.  However persistent elevated sedimentation rate highly positive serology and joint swelling is concerning for some degree of active rheumatoid arthritis.  No evidence of erosive disease.  Recommend starting treatment with hydroxychloroquine 400 mg daily otherwise continuing other conservative treatments at this time.  Reached out to [facility] ophthalmology with her history of macular degeneration no specific contraindication or concern with hydroxychloroquine treatment was expressed.  Wheelchair dependence  Currently requiring wheelchair for long distance travel around the house due to knee pain and instability with prolonged walking and standing.  Is able to stand with walker assistance for short distances and at home.  Orders: No orders of the defined types were placed in this encounter.  No orders of the defined types were placed in this encounter.    Follow-Up Instructions: Return in about 2 months (around 03/14/2022) for pSS/?RA/OA HCQ trial f/u  26mo.   CCollier Salina MD  Note - This record has been created using [employer]  Chart creation errors have been sought, but may not always  have been located. Such creation errors do not reflect on  the standard of medical care.

## 2022-01-02 ENCOUNTER — Other Ambulatory Visit (HOSPITAL_COMMUNITY): Payer: Self-pay

## 2022-01-05 LAB — ALPHA-THALASSEMIA GENOTYPR

## 2022-01-06 ENCOUNTER — Encounter: Payer: Self-pay | Admitting: *Deleted

## 2022-01-09 NOTE — Progress Notes (Signed)
Informed of results--alpha-thalassemia minor. No further intervention required.

## 2022-01-12 ENCOUNTER — Ambulatory Visit: Payer: Medicare PPO | Attending: Internal Medicine | Admitting: Internal Medicine

## 2022-01-12 ENCOUNTER — Encounter: Payer: Self-pay | Admitting: Internal Medicine

## 2022-01-12 VITALS — BP 161/85 | HR 78 | Resp 15 | Ht 63.0 in

## 2022-01-12 DIAGNOSIS — M3500 Sicca syndrome, unspecified: Secondary | ICD-10-CM

## 2022-01-12 DIAGNOSIS — M2550 Pain in unspecified joint: Secondary | ICD-10-CM | POA: Diagnosis not present

## 2022-01-12 DIAGNOSIS — R768 Other specified abnormal immunological findings in serum: Secondary | ICD-10-CM | POA: Diagnosis not present

## 2022-01-12 DIAGNOSIS — Z993 Dependence on wheelchair: Secondary | ICD-10-CM

## 2022-01-12 DIAGNOSIS — R7689 Other specified abnormal immunological findings in serum: Secondary | ICD-10-CM

## 2022-01-12 MED ORDER — HYDROXYCHLOROQUINE SULFATE 200 MG PO TABS
200.0000 mg | ORAL_TABLET | Freq: Every day | ORAL | 2 refills | Status: DC
  Filled 2022-01-12: qty 60, 60d supply, fill #0

## 2022-01-13 ENCOUNTER — Other Ambulatory Visit (HOSPITAL_COMMUNITY): Payer: Self-pay

## 2022-03-16 NOTE — Progress Notes (Deleted)
Office Visit Note  Patient: Nancy [name]             Date of Birth: 1956-01-06           MRN: [id number]             PCP: Dickie La, MD Referring: Dickie La, MD Visit Date: 03/17/2022   Subjective:  No chief complaint on file.   History of Present Illness: Nancy [name] is a 67 y.o. female here for follow up for polyarthritis with probable seropositive RA as well as some osteoarthritis after starting HCQ 400 mg daily.***   Previous HPI 01/12/22 Nancy [name] is a 67 y.o. female here for follow up for evaluation of joint pain in multiple areas and abnormal labs concerning for inflammatory arthritis currently with ongoing bilateral hand and knee pain issues are the worst. Lab results at initial visit did show persistent sedimentation rate elevation and suspicious for inflammatory pain component. Left knee was injected with Dr. Magnus Ivan after last visit with temporary improvement. She is currently in wheelchair while out and about, also using walker for support at home and feels knee is giving out or locking up occasionally.   Previous HPI 07/10/2021 Nancy [name] is a 67 y.o. female here for evaluation of joint pain in multiple areas and abnormal labs concerning for inflammatory arthritis. She apparently had previous rheumatology assessment with [facility] but not on long term DMARD treatment. Bilateral hand pain affecting her knuckles she sees swelling of MCP joints or PIP joints intermittently. On other days not much morning stiffness and pain. She has ongoing knee pain worst on the left side. She saw Dr. Magnus Ivan with intraarticular steroid injection a week ago and she had improvement but still a lot of active joint pain. Mobility is limited using a walker or more often wheelchair due to pain in her knees while weightbearing. She takes tylenol or occasionally hydrocodone for pain.    Labs reviewed ANA neg SSA >8.0 SSB neg RF 75.1 CCP >250 ESR 38   No Rheumatology ROS completed.    PMFS History:  Patient Active Problem List   Diagnosis Date Noted   Dental infection 12/22/2021   Bilateral hand pain 07/10/2021   Rheumatoid factor positive 07/10/2021   Hyperlipidemia 04/28/2021   Nocturia 12/16/2020   Asymmetrical sensorineural hearing loss 08/09/2019   Wheelchair dependence 12/17/2016   Polyarthralgia 12/02/2016   GERD (gastroesophageal reflux disease) 04/13/2014   Allergic rhinitis 01/24/2014   Morbid obesity with body mass index (BMI) of 40.0 to 44.9 in adult [facility]) 09/14/2013   Abnormal CT of brain 11/19/2011   MACULAR DEGENERATION, BILATERAL 05/22/2008   Essential hypertension 03/17/2006   SICCA SYNDROME 03/17/2006   KNEE PAIN, CHRONIC 03/17/2006   Chronic low back pain 03/17/2006   Obstructive sleep apnea 09/07/2003   Hypothyroidism 09/07/1998   Microcytic anemia 09/07/1998    Past Medical History:  Diagnosis Date   Acute cholecystitis 01/21/2021   AKI (acute kidney injury) ([facility]) 12/04/2018   Back pain    chronic low back pain L4-5 discectomy:11/99. degenerative thoracic spondylotic changes 4/07   Chest pain    neg adenosine myoview.   Hypertension    no LVH EKG-7/05, nl M/C ratio 4/07   Hypothyroidism    09/1998   Lower extremity edema    echo EF 55-65% w/o evidence of Dias dysfx.,    Menorrhagia    Microcytic anemia    09/1998. Hb-10.5/MCV 76. needs ferritin to determine ACD vs IDA  Morbid obesity ([place])    Obstructive sleep apnea    severe 7/05 RD 161 per hr. /CPAP 18cwp   Plantar fasciitis of left foot 12/16/2020   Pneumonia due to COVID-19 virus 12/04/2018   Polyarthralgia    (knee/back/ankle) w/dx of fibromyalgia? given by [facility] rheum, knee pain chronic 2/2 obesity, fibromyalgia and Sjogren's.   Primary Sjogren's syndrome ([place])    anti Ro+;ANA>1:1280 in homogen pattern, negative ds DNA/RF/anti Smith/RNP/C3-4 comp/la/jo1/Scleroderma/centromere, neg HIV/ACE/Hep B/C, nl CXR 2/05, schirmer salivary glandtest not done, symptom rx:eye  drops/prednisone/plaquenil referral to [facility] rheum, Dr. Wynelle Cleveland 3/07. saw [name] but stopped 2/2 cost.   Uterine bleeding    Viral URI with cough 11/03/2017    Family History  Problem Relation Age of Onset   Hypertension Mother    Cancer Mother    Cancer Father    Cancer Sister    Ovarian cancer Other        family history   Breast cancer Other        family history in 1st degree relatives.   Thyroid disease Sister    Colon cancer Neg Hx    Stomach cancer Neg Hx    Past Surgical History:  Procedure Laterality Date   CHOLECYSTECTOMY N/A 01/23/2021   Procedure: LAPAROSCOPIC CHOLECYSTECTOMY;  Surgeon: Georganna Skeans, MD;  Location: [place];  Service: General;  Laterality: N/A;   EXAMINATION UNDER ANESTHESIA  11/23/2011   Procedure: EXAM UNDER ANESTHESIA;  Surgeon: Osborne Oman, MD;  Location: [place] [facility];  Service: Gynecology;  Laterality: N/A;   HYSTEROSCOPY WITH D & C  11/23/2011   Procedure: DILATATION AND CURETTAGE /HYSTEROSCOPY;  Surgeon: Osborne Oman, MD;  Location: [place] [facility];  Service: Gynecology;  Laterality: N/A;   LUMBAR FUSION  1999   SHOULDER SURGERY  unk   TONSILLECTOMY     TUBAL LIGATION  1977   WISDOM TOOTH EXTRACTION     Social History   Social History Narrative   Her daughter Varney Biles) often comes with her to visits. She also has a grand daughter and great grand daughter(whose name is [place])   Immunization History  Administered Date(s) Administered   Fluad Quad(high Dose 65+) 12/08/2018, 12/16/2020   Moderna Covid-19 Vaccine Bivalent Booster 52yrs & up 12/28/2020   PFIZER(Purple Top)SARS-COV-2 Vaccination 05/18/2019, 06/12/2019, 12/16/2019   PNEUMOCOCCAL CONJUGATE-20 12/16/2020   Pneumococcal Polysaccharide-23 12/08/2018   Td 04/29/2009, 09/13/2009     Objective: Vital Signs: There were no vitals taken for this visit.   Physical Exam   Musculoskeletal Exam: ***  CDAI Exam: CDAI Score: -- Patient Global: --; Provider Global: -- Swollen:  --; Tender: -- Joint Exam 03/17/2022   No joint exam has been documented for this visit   There is currently no information documented on the homunculus. Go to the Rheumatology activity and complete the homunculus joint exam.  Investigation: No additional findings.  Imaging: No results found.  Recent Labs: Lab Results  Component Value Date   WBC 6.0 12/22/2021   HGB 11.3 12/22/2021   PLT 290 12/22/2021   NA 139 12/22/2021   K 5.0 12/22/2021   CL 101 12/22/2021   CO2 25 12/22/2021   GLUCOSE 118 (H) 12/22/2021   BUN 11 12/22/2021   CREATININE 0.80 12/22/2021   BILITOT 0.7 07/10/2021   ALKPHOS 53 01/24/2021   AST 14 07/10/2021   ALT 8 07/10/2021   PROT 7.3 07/10/2021   ALBUMIN 2.8 (L) 01/24/2021   CALCIUM 8.9 12/22/2021   GFRAA >60 09/06/2019    Speciality  Comments: No specialty comments available.  Procedures:  No procedures performed Allergies: Amlodipine, Coconut fatty acids, and Naproxen sodium   Assessment / Plan:     Visit Diagnoses: No diagnosis found.  ***  Orders: No orders of the defined types were placed in this encounter.  No orders of the defined types were placed in this encounter.    Follow-Up Instructions: No follow-ups on file.   Fuller Plan, MD  Note - This record has been created using [employer].  Chart creation errors have been sought, but may not always  have been located. Such creation errors do not reflect on  the standard of medical care.

## 2022-03-17 ENCOUNTER — Ambulatory Visit: Payer: Medicare PPO | Admitting: Internal Medicine

## 2022-03-18 ENCOUNTER — Encounter: Payer: Medicare PPO | Admitting: Internal Medicine

## 2022-03-18 NOTE — Progress Notes (Deleted)
Office Visit Note  Patient: Nancy [name]             Date of Birth: 09/16/1955           MRN: [id number]             PCP: Charise Killian, MD Referring: Charise Killian, MD Visit Date: 03/19/2022   Subjective:  No chief complaint on file.   History of Present Illness: Nancy [name] is a 67 y.o. female here for follow up for seropositive RA having start HCQ 400 mg daily after last visit. ***   Previous HPI 01/12/22 Nancy [name] is a 67 y.o. female here for follow up for evaluation of joint pain in multiple areas and abnormal labs concerning for inflammatory arthritis currently with ongoing bilateral hand and knee pain issues are the worst. Lab results at initial visit did show persistent sedimentation rate elevation and suspicious for inflammatory pain component. Left knee was injected with Dr. Ninfa Linden after last visit with temporary improvement. She is currently in wheelchair while out and about, also using walker for support at home and feels knee is giving out or locking up occasionally.   Previous HPI 07/10/2021  Nancy [name] is a 67 y.o. female here for evaluation of joint pain in multiple areas and abnormal labs concerning for inflammatory arthritis. She apparently had previous rheumatology assessment with [facility] but not on long term DMARD treatment. Bilateral hand pain affecting her knuckles she sees swelling of MCP joints or PIP joints intermittently. On other days not much morning stiffness and pain. She has ongoing knee pain worst on the left side. She saw Dr. Ninfa Linden with intraarticular steroid injection a week ago and she had improvement but still a lot of active joint pain. Mobility is limited using a walker or more often wheelchair due to pain in her knees while weightbearing. She takes tylenol or occasionally hydrocodone for pain.    Labs reviewed ANA neg SSA >8.0 SSB neg RF 75.1 CCP >250 ESR 38   No Rheumatology ROS completed.   PMFS History:  Patient Active Problem  List   Diagnosis Date Noted   Dental infection 12/22/2021   Bilateral hand pain 07/10/2021   Rheumatoid factor positive 07/10/2021   Hyperlipidemia 04/28/2021   Nocturia 12/16/2020   Asymmetrical sensorineural hearing loss 08/09/2019   Wheelchair dependence 12/17/2016   Polyarthralgia 12/02/2016   GERD (gastroesophageal reflux disease) 04/13/2014   Allergic rhinitis 01/24/2014   Morbid obesity with body mass index (BMI) of 40.0 to 44.9 in adult [facility]) 09/14/2013   Abnormal CT of brain 11/19/2011   MACULAR DEGENERATION, BILATERAL 05/22/2008   Essential hypertension 03/17/2006   SICCA SYNDROME 03/17/2006   KNEE PAIN, CHRONIC 03/17/2006   Chronic low back pain 03/17/2006   Obstructive sleep apnea 09/07/2003   Hypothyroidism 09/07/1998   Microcytic anemia 09/07/1998    Past Medical History:  Diagnosis Date   Acute cholecystitis 01/21/2021   AKI (acute kidney injury) ([place]) 12/04/2018   Back pain    chronic low back pain L4-5 discectomy:11/99. degenerative thoracic spondylotic changes 4/07   Chest pain    neg adenosine myoview.   Hypertension    no LVH EKG-7/05, nl M/C ratio 4/07   Hypothyroidism    09/1998   Lower extremity edema    echo EF 55-65% w/o evidence of Dias dysfx.,    Menorrhagia    Microcytic anemia    09/1998. Hb-10.5/MCV 76. needs ferritin to determine ACD vs IDA   Morbid  obesity ([place])    Obstructive sleep apnea    severe 7/05 RD 161 per hr. /CPAP 18cwp   Plantar fasciitis of left foot 12/16/2020   Pneumonia due to COVID-19 virus 12/04/2018   Polyarthralgia    (knee/back/ankle) w/dx of fibromyalgia? given by [facility] rheum, knee pain chronic 2/2 obesity, fibromyalgia and Sjogren's.   Primary Sjogren's syndrome ([place])    anti Ro+;ANA>1:1280 in homogen pattern, negative ds DNA/RF/anti Smith/RNP/C3-4 comp/la/jo1/Scleroderma/centromere, neg HIV/ACE/Hep B/C, nl CXR 2/05, schirmer salivary glandtest not done, symptom rx:eye drops/prednisone/plaquenil referral to [facility]  rheum, Dr. Wynelle Cleveland 3/07. saw [name] but stopped 2/2 cost.   Uterine bleeding    Viral URI with cough 11/03/2017    Family History  Problem Relation Age of Onset   Hypertension Mother    Cancer Mother    Cancer Father    Cancer Sister    Ovarian cancer Other        family history   Breast cancer Other        family history in 1st degree relatives.   Thyroid disease Sister    Colon cancer Neg Hx    Stomach cancer Neg Hx    Past Surgical History:  Procedure Laterality Date   CHOLECYSTECTOMY N/A 01/23/2021   Procedure: LAPAROSCOPIC CHOLECYSTECTOMY;  Surgeon: Georganna Skeans, MD;  Location: [place];  Service: General;  Laterality: N/A;   EXAMINATION UNDER ANESTHESIA  11/23/2011   Procedure: EXAM UNDER ANESTHESIA;  Surgeon: Osborne Oman, MD;  Location: [facility];  Service: Gynecology;  Laterality: N/A;   HYSTEROSCOPY WITH D & C  11/23/2011   Procedure: DILATATION AND CURETTAGE /HYSTEROSCOPY;  Surgeon: Osborne Oman, MD;  Location: [facility];  Service: Gynecology;  Laterality: N/A;   LUMBAR FUSION  1999   SHOULDER SURGERY  unk   TONSILLECTOMY     TUBAL LIGATION  1977   WISDOM TOOTH EXTRACTION     Social History   Social History Narrative   Her daughter Varney Biles) often comes with her to visits. She also has a grand daughter and great grand daughter(whose name is [place])   Immunization History  Administered Date(s) Administered   Fluad Quad(high Dose 65+) 12/08/2018, 12/16/2020   Moderna Covid-19 Vaccine Bivalent Booster 16yr & up 12/28/2020   PFIZER(Purple Top)SARS-COV-2 Vaccination 05/18/2019, 06/12/2019, 12/16/2019   PNEUMOCOCCAL CONJUGATE-20 12/16/2020   Pneumococcal Polysaccharide-23 12/08/2018   Td 04/29/2009, 09/13/2009     Objective: Vital Signs: There were no vitals taken for this visit.   Physical Exam   Musculoskeletal Exam: ***  CDAI Exam: CDAI Score: -- Patient Global: --; Provider Global: -- Swollen: --; Tender: -- Joint Exam 03/19/2022   No  joint exam has been documented for this visit   There is currently no information documented on the homunculus. Go to the Rheumatology activity and complete the homunculus joint exam.  Investigation: No additional findings.  Imaging: No results found.  Recent Labs: Lab Results  Component Value Date   WBC 6.0 12/22/2021   HGB 11.3 12/22/2021   PLT 290 12/22/2021   NA 139 12/22/2021   K 5.0 12/22/2021   CL 101 12/22/2021   CO2 25 12/22/2021   GLUCOSE 118 (H) 12/22/2021   BUN 11 12/22/2021   CREATININE 0.80 12/22/2021   BILITOT 0.7 07/10/2021   ALKPHOS 53 01/24/2021   AST 14 07/10/2021   ALT 8 07/10/2021   PROT 7.3 07/10/2021   ALBUMIN 2.8 (L) 01/24/2021   CALCIUM 8.9 12/22/2021   GFRAA >60 09/06/2019    Speciality Comments:  No specialty comments available.  Procedures:  No procedures performed Allergies: Amlodipine, Coconut fatty acids, and Naproxen sodium   Assessment / Plan:     Visit Diagnoses: No diagnosis found.  ***  Orders: No orders of the defined types were placed in this encounter.  No orders of the defined types were placed in this encounter.    Follow-Up Instructions: No follow-ups on file.   Collier Salina, MD  Note - This record has been created using [employer].  Chart creation errors have been sought, but may not always  have been located. Such creation errors do not reflect on  the standard of medical care.

## 2022-03-19 ENCOUNTER — Ambulatory Visit: Payer: Medicare PPO | Attending: Internal Medicine | Admitting: Internal Medicine

## 2022-04-06 ENCOUNTER — Ambulatory Visit (INDEPENDENT_AMBULATORY_CARE_PROVIDER_SITE_OTHER): Payer: Medicare PPO | Admitting: Internal Medicine

## 2022-04-06 ENCOUNTER — Other Ambulatory Visit: Payer: Self-pay

## 2022-04-06 ENCOUNTER — Other Ambulatory Visit (HOSPITAL_COMMUNITY): Payer: Self-pay

## 2022-04-06 ENCOUNTER — Encounter: Payer: Self-pay | Admitting: Internal Medicine

## 2022-04-06 ENCOUNTER — Ambulatory Visit (HOSPITAL_COMMUNITY)
Admission: RE | Admit: 2022-04-06 | Discharge: 2022-04-06 | Disposition: A | Discharge status: Home / Self Care | Payer: Medicare PPO | Source: Ambulatory Visit | Attending: Internal Medicine | Admitting: Internal Medicine

## 2022-04-06 VITALS — BP 164/81 | HR 71 | Temp 98.4°F | Ht 63.0 in | Wt 248.9 lb

## 2022-04-06 DIAGNOSIS — R351 Nocturia: Secondary | ICD-10-CM | POA: Diagnosis not present

## 2022-04-06 DIAGNOSIS — Z993 Dependence on wheelchair: Secondary | ICD-10-CM

## 2022-04-06 DIAGNOSIS — R052 Subacute cough: Secondary | ICD-10-CM | POA: Diagnosis not present

## 2022-04-06 DIAGNOSIS — M25562 Pain in left knee: Secondary | ICD-10-CM | POA: Diagnosis not present

## 2022-04-06 DIAGNOSIS — I10 Essential (primary) hypertension: Secondary | ICD-10-CM

## 2022-04-06 DIAGNOSIS — R059 Cough, unspecified: Secondary | ICD-10-CM | POA: Diagnosis not present

## 2022-04-06 DIAGNOSIS — R051 Acute cough: Secondary | ICD-10-CM | POA: Insufficient documentation

## 2022-04-06 DIAGNOSIS — G4733 Obstructive sleep apnea (adult) (pediatric): Secondary | ICD-10-CM

## 2022-04-06 DIAGNOSIS — M3500 Sicca syndrome, unspecified: Secondary | ICD-10-CM

## 2022-04-06 DIAGNOSIS — D563 Thalassemia minor: Secondary | ICD-10-CM | POA: Diagnosis not present

## 2022-04-06 DIAGNOSIS — E785 Hyperlipidemia, unspecified: Secondary | ICD-10-CM | POA: Diagnosis not present

## 2022-04-06 DIAGNOSIS — M2550 Pain in unspecified joint: Secondary | ICD-10-CM

## 2022-04-06 MED ORDER — BIOTENE DRY MOUTH GENTLE MT LIQD
15.0000 mL | Freq: Three times a day (TID) | OROMUCOSAL | 0 refills | Status: DC | PRN
  Filled 2022-04-06: qty 473, fill #0

## 2022-04-06 MED ORDER — AMOXICILLIN-POT CLAVULANATE 875-125 MG PO TABS: 1.0000 | ORAL_TABLET | Freq: Two times a day (BID) | ORAL | 0 refills | Status: DC

## 2022-04-06 MED ORDER — AMOXICILLIN-POT CLAVULANATE 875-125 MG PO TABS
1.0000 | ORAL_TABLET | Freq: Two times a day (BID) | ORAL | 0 refills | Status: DC
  Filled 2022-04-06: qty 10, 5d supply, fill #0

## 2022-04-06 MED ORDER — HYDROCODONE-ACETAMINOPHEN 5-325 MG PO TABS
1.0000 | ORAL_TABLET | Freq: Two times a day (BID) | ORAL | 0 refills | Status: DC | PRN
  Filled 2022-04-06: qty 30, 15d supply, fill #0

## 2022-04-06 NOTE — Assessment & Plan Note (Signed)
Patient presents with productive cough and congestion for about three weeks. She has also had a headache with these symptoms and reports chest and back soreness with the cough. Patient reports that she initially had fever with the cough and she endorses having chills today. She has also been short of breath for about a week. She has not used her albuterol rescue inhaler. She reports she has not had to use more pillows at night than normal since these symptoms began.   On exam, patient is congested with frontal sinus tender to palpation. She is short of breath, there are no wheezes or crackles heard on auscultation.  Assessment: Patient's symptoms of 3-week history of cough with congestion and sinus pain is suggestive of a sinus infection. Chest x-ray ordered as well to evaluate for pneumonia - patient is afebrile and lungs are clear to auscultation but she is short of breath on exam. Exam findings less likely suggestive of heart failure.  Will plan to provide patient with Augmentin for sinus infection and follow-up in 1-week to re-evaluate.  Plan -Start Augmentin 875-125 mg two times a day for 5 days -Follow-up chest x-ray -Follow-up CBC

## 2022-04-06 NOTE — Progress Notes (Signed)
Attestation for Student Documentation:  I personally was present and performed or re-performed the history, physical exam and medical decision-making activities of this service and have verified that the service and findings are accurately documented in the student's note.  Three weeks of productive cough, fever/chills. Today, purulent sinus drainage with frontal headache/pressure and chills c/f bacterial sinusitis. 5-day course of Augmentin provided. She reports dyspnea is due to nasal congestion. She does not appear to have increased respiratory effort and oxygenating well. Lung exam unremarkable, no significant edema or evidence of volume overload on history or examination. Will f/u closely in one week for resolution of symptoms, ED/return precautions provided.  Left lower back pain with light palpation from shoulder blade to hip. Suspect muscle strain in the setting of frequent coughing. Recommended topical therapies and acetaminophen as needed. Cautioned about frequent BC use due to SE. Refill of chronic hydrocodone-acetaminophen prn.  BP remains uncontrolled. Ms. Schloss reports not taking her medications this morning, however fill history also concerning for non-adherence. We will continue to address after acute symptom improvement. Will continue to address nocturia, incontinence, obesity, HLD, and OSA at follow-up visits.  Charise Killian, MD 04/06/2022, 1:32 PM

## 2022-04-06 NOTE — Assessment & Plan Note (Addendum)
Patient reports about a 3-week history of left back pain exacerbation that began around the same time as her cough. She reports this pain as 10/10 and aching in quality. She did not experience physical trauma to the region. She reports that the pain is exacerbated by cough and increased movement such as standing up. She has been using heat pad, roller, pain relieving patch, and Tylenol with mild relief of symptoms. She has also required her hydrocodone-acetaminophen PRN more than usual during the past 3 weeks.  On exam, left back with tightness and diffuse tenderness to palpation, particularly towards lower back. Right back unremarkable for pain on palpation. No new rashes observed on skin.  Assessment: Patient has an extensive history of lower back pain and her 3-week history of left lower back pain exacerbation may be a result of new-onset muscle strain. Suspect that her 3-week history of cough has also been contributing to this strain. Will plan to continue with symptomatic care and follow-up in 1 week to evaluate if her left back pain improves with improvement of her cough symptoms.  Plan -Continue Tylenol, heating pad, roller, and pain relieving patch -BC powder/NSAIDS ocassionally for days with worse pain -Hydrocodone-acetaminophen BID PRN refilled -Follow-up in 1 week

## 2022-04-06 NOTE — Assessment & Plan Note (Addendum)
Patient is on atorvastatin 80 mg daily. Her ASCVD is 27%. At her prior visit, discussed addition of ezetimibe for further LDL lowering and ASCVD risk reduction, however she would prefer not starting new medications if possible. Lipid panel repeated today.  Plan -Follow-up lipid panel and will evaluate for possible changes in medication depending on results

## 2022-04-06 NOTE — Assessment & Plan Note (Addendum)
Patient continues to experience nocturia and also reports symptoms of mixed urge and stress incontinence. Patient reports drinking a lot of water due to dry mouth given her history of Sjogren. Patient reports using CPAP for OSA on some nights. Will plan to start patient on saliva substitute to help with dry mouth symptoms.  Plan -Encouraged patient to avoid too much fluids in evenings before bedtime -Start Biotene saliva substitute 3 times as needed for dry mouth -Encouraged patient adhere to CPAP consistently -Continue to monitor

## 2022-04-06 NOTE — Assessment & Plan Note (Addendum)
BP elevated at 164/81 today. Patient reports she has not had her BP medications today. She has been on losartan 100 mg, metoprolol XL 50 mg, chlorthalidone 25 mg, and her amlodipine was increased to 10 mg at her last visit. She reports no issues with taking her medications. She denies radiating chest pain. There is no leg swelling on exam today.   Plan -Continue losartan 100 mg, metoprolol XL 50 mg, chlorthalidone 25 mg, and amlodipine 10 mg daily -Follow-up BMP

## 2022-04-06 NOTE — Progress Notes (Signed)
Subjective:   Patient ID: TKAI LARGE female   DOB: 03-28-1955 67 y.o.   MRN: [id number]  HPI: Nancy [name] is a 67 y.o. woman with medical history listed below who presents to [facility] for follow-up of her hypertension and arthritis. She also presents with 3-week history of cough and congestion as well as left back pain. Please see problem-based assessment and plan for full details.  Review of Systems: Pertinent items are noted in HPI of problem-based assessment and plan.  Past Medical History:  Diagnosis Date   Acute cholecystitis 01/21/2021   AKI (acute kidney injury) ([place]) 12/04/2018   Back pain    chronic low back pain L4-5 discectomy:11/99. degenerative thoracic spondylotic changes 4/07   Chest pain    neg adenosine myoview.   Hypertension    no LVH EKG-7/05, nl M/C ratio 4/07   Hypothyroidism    09/1998   Lower extremity edema    echo EF 55-65% w/o evidence of Dias dysfx.,    Menorrhagia    Microcytic anemia    09/1998. Hb-10.5/MCV 76. needs ferritin to determine ACD vs IDA   Morbid obesity ([place])    Obstructive sleep apnea    severe 7/05 RD 161 per hr. /CPAP 18cwp   Plantar fasciitis of left foot 12/16/2020   Pneumonia due to COVID-19 virus 12/04/2018   Polyarthralgia    (knee/back/ankle) w/dx of fibromyalgia? given by [facility] rheum, knee pain chronic 2/2 obesity, fibromyalgia and Sjogren's.   Primary Sjogren's syndrome ([place])    anti Ro+;ANA>1:1280 in homogen pattern, negative ds DNA/RF/anti Smith/RNP/C3-4 comp/la/jo1/Scleroderma/centromere, neg HIV/ACE/Hep B/C, nl CXR 2/05, schirmer salivary glandtest not done, symptom rx:eye drops/prednisone/plaquenil referral to [facility] rheum, Dr. Wynelle Cleveland 3/07. saw [name] but stopped 2/2 cost.   Uterine bleeding    Viral URI with cough 11/03/2017    Current Outpatient Medications  Medication Sig Dispense Refill   Mouthwashes (BIOTENE DRY MOUTH GENTLE) LIQD Use as directed 15 mLs in the mouth or throat 3 (three)  times daily as needed (Swish and spit up to three times daily as needed.). 473 mL 0   albuterol (PROVENTIL HFA;VENTOLIN HFA) 108 (90 Base) MCG/ACT inhaler Inhale 1-2 puffs every 6 (six) hours as needed into the lungs for wheezing or shortness of breath. 1 Inhaler 0   amLODipine (NORVASC) 10 MG tablet Take 1 tablet (10 mg total) by mouth daily. 30 tablet 11   amoxicillin-clavulanate (AUGMENTIN) 875-125 MG tablet Take 1 tablet by mouth 2 (two) times daily. 10 tablet 0   APPLE CIDER VINEGAR PO Take 1 tablet by mouth daily. Gummy [place]     atorvastatin (LIPITOR) 80 MG tablet Take 1 tablet (80 mg total) by mouth daily. 90 tablet 3   chlorthalidone (HYGROTON) 25 MG tablet Take 1 tablet (25 mg total) by mouth daily. 90 tablet 3   diclofenac Sodium (VOLTAREN) 1 % GEL Apply 4 g topically every 6 (six) hours as needed (arthritis pain). 150 g 0   HYDROcodone-acetaminophen (NORCO/VICODIN) 5-325 MG tablet Take 1 tablet by mouth 2 (two) times daily as needed for severe pain. 30 tablet 0   hydroxychloroquine (PLAQUENIL) 200 MG tablet Take 1 tablet (200 mg total) by mouth daily. 60 tablet 2   levothyroxine (SYNTHROID) 50 MCG tablet Take 1 tablet (50 mcg total) by mouth daily. 90 tablet 3   losartan (COZAAR) 100 MG tablet Take 1 tablet (100 mg total) by mouth daily. 90 tablet 3   metoprolol succinate (TOPROL-XL) 50 MG 24 hr  tablet Take 1 tablet (50 mg total) by mouth daily. Take with or immediately following a meal. 90 tablet 3   No current facility-administered medications for this visit.     Objective:   Physical Exam: Vitals:   04/06/22 1007 04/06/22 1041  BP: (!) 169/81 (!) 164/81  Pulse: 73 71  Temp: 98.4 F (36.9 C)   TempSrc: Oral   SpO2: 97%   Weight: 248 lb 14.4 oz (112.9 kg)   Height: 5\' 3"  (1.6 m)     Constitutional: pleasant, ill-appearing, in no acute distress, in wheel chair HENT: nasal congestion. Frontal sinus tender to palpation Eyes: conjunctiva non-erythematous Cardiovascular:  regular rate with normal rhythm, no murmurs Pulmonary/Chest: mild tachypnea on room air, lungs clear to auscultation bilaterally with no crackles or wheezes Abdominal: soft, non-tender, non-distended, bowel sounds present MSK: no lower extremity edema. Left back with tightness and diffuse tenderness to palpation particularly towards lower back Skin: warm and dry. No rashes Neurological: alert and answering questions appropriately. Psych: appropriate mood and affect   Assessment & Plan:   Subacute cough Patient presents with productive cough and congestion for about three weeks. She has also had a headache with these symptoms and reports chest and back soreness with the cough. Patient reports that she initially had fever with the cough and she endorses having chills today. She has also been short of breath for about a week. She has not used her albuterol rescue inhaler. She reports she has not had to use more pillows at night than normal since these symptoms began.   On exam, patient is congested with frontal sinus tender to palpation. She is short of breath, there are no wheezes or crackles heard on auscultation.  Assessment: Patient's symptoms of 3-week history of cough with congestion and sinus pain is suggestive of a sinus infection. Chest x-ray ordered as well to evaluate for pneumonia - patient is afebrile and lungs are clear to auscultation but she is short of breath on exam. Exam findings less likely suggestive of heart failure.  Will plan to provide patient with Augmentin for sinus infection and follow-up in 1-week to re-evaluate.  Plan -Start Augmentin 875-125 mg two times a day for 5 days -Follow-up chest x-ray -Follow-up CBC  Chronic low back pain Patient reports about a 3-week history of left back pain exacerbation that began around the same time as her cough. She reports this pain as 10/10 and aching in quality. She did not experience physical trauma to the region. She reports  that the pain is exacerbated by cough and increased movement such as standing up. She has been using heat pad, roller, pain relieving patch, and Tylenol with mild relief of symptoms. She has also required her hydrocodone-acetaminophen PRN more than usual during the past 3 weeks.  On exam, left back with tightness and diffuse tenderness to palpation, particularly towards lower back. Right back unremarkable for pain on palpation. No new rashes observed on skin.  Assessment: Patient has an extensive history of lower back pain and her 3-week history of left lower back pain exacerbation may be a result of new-onset muscle strain. Suspect that her 3-week history of cough has also been contributing to this strain. Will plan to continue with symptomatic care and follow-up in 1 week to evaluate if her left back pain improves with improvement of her cough symptoms.  Plan -Continue Tylenol, heating pad, roller, and pain relieving patch -BC powder/NSAIDS ocassionally for days with worse pain -Hydrocodone-acetaminophen BID PRN refilled -Follow-up  in 1 week  Nocturia Patient continues to experience nocturia and also reports symptoms of mixed urge and stress incontinence. Patient reports drinking a lot of water due to dry mouth given her history of Sjogren. Patient reports using CPAP for OSA on some nights. Will plan to start patient on saliva substitute to help with dry mouth symptoms.  Plan -Encouraged patient to avoid too much fluids in evenings before bedtime -Start Biotene saliva substitute 3 times as needed for dry mouth -Encouraged patient adhere to CPAP consistently -Continue to monitor  Essential hypertension BP elevated at 164/81 today. Patient reports she has not had her BP medications today. She has been on losartan 100 mg, metoprolol XL 50 mg, chlorthalidone 25 mg, and her amlodipine was increased to 10 mg at her last visit. She reports no issues with taking her medications. She denies radiating  chest pain. There is no leg swelling on exam today.   Plan -Continue losartan 100 mg, metoprolol XL 50 mg, chlorthalidone 25 mg, and amlodipine 10 mg daily -Follow-up BMP  Hyperlipidemia Patient is on atorvastatin 80 mg daily. Her ASCVD is 27%. At her prior visit, discussed addition of ezetimibe for further LDL lowering and ASCVD risk reduction, however she would prefer not starting new medications if possible. Lipid panel repeated today.  Plan -Follow-up lipid panel and will evaluate for possible changes in medication depending on results   Nancy [name], MS3 04/06/2022

## 2022-04-06 NOTE — Patient Instructions (Addendum)
Ms. Mallari,  It was great to see you in clinic today.  Below is what we discussed today:  For your cough, congestion, and chills - We are ordering an x-ray to evaluate your lungs for pneumonia. We have prescribed antibiotics (Augmentin) to help with your ongoing sinus pain and congestion. We will follow-up at an appointment in 1 week to see if your symptoms are improving For your left lower back pain - We think this is potentially from muscle strain. Continue using heating pad, roller, patch, and Tylenol. The cough may be contributing to your pain. We will check-in with you at an appointment in 1 week to see if your pain is improving For your urinary incontinence - We recommend trying to minimize fluid intake in the evenings before bedtime. We also recommend using your CPAP consistently to help with symptoms. We are prescribing a saliva replacement to help with dry mouth Lab work for electrolytes, blood counts, and cholesterol levels today  Please note the following changes to your medications:  - Start amoxicillin-clavulanate (AUGMENTIN) 875-125 MG tablet two times a day for 5 days - Start BIOTENE DRY MOUTH GENTLE LIQD Mouthwash 15 mL in mouth three times a day as needed - swish and spit up   It is a pleasure to be a part of your team, thank you for allowing [place] to be a part of your care,  Max and Dr. Saverio Danker  If you need medication refills please notify your pharmacy one week in advance and they will send [place] a request.     Please contact your pharmacy about scheduling updated COVID-19 vaccines.

## 2022-04-07 ENCOUNTER — Other Ambulatory Visit (HOSPITAL_COMMUNITY): Payer: Self-pay

## 2022-04-07 LAB — LIPID PANEL
Chol/HDL Ratio: 4.1 ratio (ref 0.0–4.4)
Cholesterol, Total: 188 mg/dL (ref 100–199)
HDL: 46 mg/dL
LDL Chol Calc (NIH): 116 mg/dL — ABNORMAL HIGH (ref 0–99)
Triglycerides: 149 mg/dL (ref 0–149)
VLDL Cholesterol Cal: 26 mg/dL (ref 5–40)

## 2022-04-07 LAB — CBC WITH DIFFERENTIAL/PLATELET
Basophils Absolute: 0.1 10*3/uL (ref 0.0–0.2)
Basos: 1 %
EOS (ABSOLUTE): 0.2 10*3/uL (ref 0.0–0.4)
Eos: 4 %
Hematocrit: 37.3 % (ref 34.0–46.6)
Hemoglobin: 11.2 g/dL (ref 11.1–15.9)
Immature Grans (Abs): 0 10*3/uL (ref 0.0–0.1)
Immature Granulocytes: 0 %
Lymphocytes Absolute: 2.3 10*3/uL (ref 0.7–3.1)
Lymphs: 39 %
MCH: 22.6 pg — ABNORMAL LOW (ref 26.6–33.0)
MCHC: 30 g/dL — ABNORMAL LOW (ref 31.5–35.7)
MCV: 75 fL — ABNORMAL LOW (ref 79–97)
Monocytes Absolute: 0.4 10*3/uL (ref 0.1–0.9)
Monocytes: 8 %
Neutrophils Absolute: 2.9 10*3/uL (ref 1.4–7.0)
Neutrophils: 48 %
Platelets: 453 10*3/uL — ABNORMAL HIGH (ref 150–450)
RBC: 4.96 x10E6/uL (ref 3.77–5.28)
RDW: 14.9 % (ref 11.7–15.4)
WBC: 5.9 10*3/uL (ref 3.4–10.8)

## 2022-04-07 LAB — BMP8+ANION GAP
Anion Gap: 15 mmol/L (ref 10.0–18.0)
BUN/Creatinine Ratio: 11 — ABNORMAL LOW (ref 12–28)
BUN: 11 mg/dL (ref 8–27)
CO2: 25 mmol/L (ref 20–29)
Calcium: 9.1 mg/dL (ref 8.7–10.3)
Chloride: 99 mmol/L (ref 96–106)
Creatinine, Ser: 0.96 mg/dL (ref 0.57–1.00)
Glucose: 102 mg/dL — ABNORMAL HIGH (ref 70–99)
Potassium: 4.6 mmol/L (ref 3.5–5.2)
Sodium: 139 mmol/L (ref 134–144)
eGFR: 64 mL/min/{1.73_m2}

## 2022-04-07 MED ORDER — LIDOCAINE 5 % EX PTCH
1.0000 | MEDICATED_PATCH | Freq: Two times a day (BID) | CUTANEOUS | 0 refills | Status: AC
  Filled 2022-04-07: qty 10, 10d supply, fill #0

## 2022-04-07 NOTE — Progress Notes (Signed)
Kidney function, electrolytes wnl.

## 2022-04-07 NOTE — Progress Notes (Signed)
LDL remains >100. Reviewed with Nancy [name] who reports taking her atorvastatin every other day due to not liking to take medications. I stressed the importance of reducing her ASCVD risk and encouraged her to take this medication daily.

## 2022-04-07 NOTE — Progress Notes (Signed)
No leukocytosis. Stable microcytosis. Mild elevation in platelets.

## 2022-04-07 NOTE — Addendum Note (Signed)
Addended by: Charise Killian on: 04/07/2022 01:42 PM   Modules accepted: Orders

## 2022-04-09 ENCOUNTER — Other Ambulatory Visit (HOSPITAL_COMMUNITY): Payer: Self-pay

## 2022-04-13 ENCOUNTER — Ambulatory Visit: Payer: Medicare PPO

## 2022-04-13 ENCOUNTER — Ambulatory Visit (INDEPENDENT_AMBULATORY_CARE_PROVIDER_SITE_OTHER): Payer: Medicare PPO | Admitting: Internal Medicine

## 2022-04-13 ENCOUNTER — Other Ambulatory Visit (HOSPITAL_COMMUNITY): Payer: Self-pay

## 2022-04-13 ENCOUNTER — Encounter: Payer: Medicare PPO | Admitting: Internal Medicine

## 2022-04-13 DIAGNOSIS — N3946 Mixed incontinence: Secondary | ICD-10-CM

## 2022-04-13 DIAGNOSIS — G8929 Other chronic pain: Secondary | ICD-10-CM

## 2022-04-13 DIAGNOSIS — M545 Low back pain, unspecified: Secondary | ICD-10-CM | POA: Diagnosis not present

## 2022-04-13 MED ORDER — TIZANIDINE HCL 4 MG PO TABS
4.0000 mg | ORAL_TABLET | Freq: Three times a day (TID) | ORAL | 0 refills | Status: DC | PRN
  Filled 2022-04-13: qty 30, 10d supply, fill #0

## 2022-04-13 MED ORDER — GABAPENTIN 300 MG PO CAPS
300.0000 mg | ORAL_CAPSULE | Freq: Every day | ORAL | 0 refills | Status: DC
  Filled 2022-04-13: qty 30, 30d supply, fill #0

## 2022-04-13 NOTE — Progress Notes (Signed)
  [facility] Telephone Encounter Continuity Care Appointment  HPI:  This telephone encounter was created for [name] on 04/13/2022 for the following purpose/cc: f/u left back pain. Please see assessment/plan in problem-based charting for further details of today's visit.    Past Medical History:  Past Medical History:  Diagnosis Date   Acute cholecystitis 01/21/2021   AKI (acute kidney injury) ([place]) 12/04/2018   Back pain    chronic low back pain L4-5 discectomy:11/99. degenerative thoracic spondylotic changes 4/07   Chest pain    neg adenosine myoview.   Hypertension    no LVH EKG-7/05, nl M/C ratio 4/07   Hypothyroidism    09/1998   Lower extremity edema    echo EF 55-65% w/o evidence of Dias dysfx.,    Menorrhagia    Microcytic anemia    09/1998. Hb-10.5/MCV 76. needs ferritin to determine ACD vs IDA   Morbid obesity ([place])    Obstructive sleep apnea    severe 7/05 RD 161 per hr. /CPAP 18cwp   Plantar fasciitis of left foot 12/16/2020   Pneumonia due to COVID-19 virus 12/04/2018   Polyarthralgia    (knee/back/ankle) w/dx of fibromyalgia? given by [facility] rheum, knee pain chronic 2/2 obesity, fibromyalgia and Sjogren's.   Primary Sjogren's syndrome ([place])    anti Ro+;ANA>1:1280 in homogen pattern, negative ds DNA/RF/anti Smith/RNP/C3-4 comp/la/jo1/Scleroderma/centromere, neg HIV/ACE/Hep B/C, nl CXR 2/05, schirmer salivary glandtest not done, symptom rx:eye drops/prednisone/plaquenil referral to [facility] rheum, Dr. Wynelle Cleveland 3/07. saw [name] but stopped 2/2 cost.   Uterine bleeding    Viral URI with cough 11/03/2017      Assessment / Plan / Recommendations:  Please see A&P under problem oriented charting for assessment of the patient's acute and chronic medical conditions.  As always, pt is advised that if symptoms worsen or new symptoms arise, they should go to an urgent care facility or to to ER for further evaluation.   Consent  and Medical Decision Making:  This is a telephone encounter between Nancy [name] and Charise Killian on 04/13/2022 for follow-up of left sided back pain. The visit was conducted with the patient located at home and Charise Killian at [facility]. The patient's identity was confirmed using their DOB and current address. The patient has consented to being evaluated through a telephone encounter and understands the associated risks (an examination cannot be done and the patient may need to come in for an appointment) / benefits (allows the patient to remain at home, decreasing exposure to coronavirus). I personally spent 20 minutes on medical discussion.

## 2022-04-13 NOTE — Assessment & Plan Note (Signed)
>>  ASSESSMENT AND PLAN FOR ACUTE EXACERBATION OF CHRONIC LOW BACK PAIN WRITTEN ON 04/13/2022  3:55 PM BY Bradi Arbuthnot, MD  Longstanding history of chronic low back pain 2/2 DDD. S/p L4-L5 lumbar fusion in 1999. Lumbar XR w/ mild DDD and facet arthropathy at L5-S1 and thoracic XR  w/ degenerative spondylotic changes in 2009. I do not see any dedicated spine imaging since that time.   Ms. Buddenhagen presented 1/29 w/ acute onset of worsened left-sided back pain that had persisted for three weeks. She reported the symptoms started soon after she received the Shingrix vaccine and around the time she developed URI symptoms of cough and nasal congestion. No trauma. She was tender to palpation from left shoulder blade to hip and our suspicion was exacerbation of MSK pain.   Since last week, Ms. Hornbrook reports her pain has not significantly improved. The pain is intermittent and now described as sharp and poking. She also describes itching. There was no rash noted at last visit and Ms. Mangel does not see a rash now. She does not believe there was ever a rash. She describes the pain from under her left arm down to the hip. No pain or similar sensations in the left leg or down the left arm. No weakness or numbness. No saddle anesthesia. No new urinary incontinence, no fecal incontinence. Pain is worst when she is lying on that side at night. No pain with light palpation of the skin. She has been taking acetaminophen 500 mg 1-2x daily and hydrocodone-acetaminophen 0-1x daily (no more than twice daily maximum). She has not yet picked up the lidocaine patches. She uses BC powder occasionally when pain is very severe but she does not want to take hydrocodone. No stretching.  Assessment Distribution and description of left back pain is very unusual. Ms. Bondoc description today sounds concerning for neuropathic pain in the left thoracic dermatomes. Considerations include thoracic polyradiculopathy or myelopathy, although no  additional weakness or other neurologic findings make this less likely.  Does not wrap to the front. Possible that this is an unusual adverse reaction to the shingles vaccine, such as acute neuritis, although again it seems strange that it would affect multiple dermatomes. I have considered the atypical variation of shingles, "zoster sine herpete", which presents as the pain of zoster infection without rash, however does not fit with multiple dermatomes. Description sounds less consistent with muscular spasm or strain at this time.  Plan -Start gabapentin 300 mg qhs for neuropathic pain -We also discussed lidocaine patches, Tylenol 1000 mg tid prn, ibuprofen 400-600 mg prn (stop BC).  -Chronic hydrocodone-acetaminophen up to two times daily maximum -Plan for in-person f/u later this week for further neurologic examination

## 2022-04-13 NOTE — Assessment & Plan Note (Addendum)
Longstanding history of chronic low back pain 2/2 DDD. S/p L4-L5 lumbar fusion in 1999. Lumbar XR w/ mild DDD and facet arthropathy at L5-S1 and thoracic XR  w/ degenerative spondylotic changes in 2009. I do not see any dedicated spine imaging since that time.   [name] presented 1/29 w/ acute onset of worsened left-sided back pain that had persisted for three weeks. She reported the symptoms started soon after she received the Shingrix vaccine and around the time she developed URI symptoms of cough and nasal congestion. No trauma. She was tender to palpation from left shoulder blade to hip and our suspicion was exacerbation of MSK pain.   Since last week, [name] reports her pain has not significantly improved. The pain is intermittent and now described as sharp and poking. She also describes itching. There was no rash noted at last visit and [name] does not see a rash now. She does not believe there was ever a rash. She describes the pain from under her left arm down to the hip. No pain or similar sensations in the left leg or down the left arm. No weakness or numbness. No saddle anesthesia. No new urinary incontinence, no fecal incontinence. Pain is worst when she is lying on that side at night. No pain with light palpation of the skin. She has been taking acetaminophen 500 mg 1-2x daily and hydrocodone-acetaminophen 0-1x daily (no more than twice daily maximum). She has not yet picked up the lidocaine patches. She uses BC powder occasionally when pain is very severe but she does not want to take hydrocodone. No stretching.  Assessment Distribution and description of left back pain is very unusual. [name] description today sounds concerning for neuropathic pain in the left thoracic dermatomes. Considerations include thoracic polyradiculopathy or myelopathy, although no additional weakness or other neurologic findings make this less likely.  Does not wrap to the front. Possible that this is  an unusual adverse reaction to the shingles vaccine, such as acute neuritis, although again it seems strange that it would affect multiple dermatomes. I have considered the atypical variation of shingles, "zoster sine herpete", which presents as the pain of zoster infection without rash, however does not fit with multiple dermatomes. Description sounds less consistent with muscular spasm or strain at this time.  Plan -Start gabapentin 300 mg qhs for neuropathic pain -We also discussed lidocaine patches, Tylenol 1000 mg tid prn, ibuprofen 400-600 mg prn (stop BC).  -Chronic hydrocodone-acetaminophen up to two times daily maximum -Plan for in-person f/u later this week for further neurologic examination

## 2022-04-13 NOTE — Patient Instructions (Addendum)
For back pain -Start gabapentin 300 mg nightly -Tylenol 1000 mg up to three times daily as needed -Lidocaine patches -Ibuprofen 400-600 mg every 6 hours as needed for severe pain -You may take hydrocodone-acetaminophen up to two times daily as needed -STOP BC powder  For urinary symptoms -Start Kegel exercises to help with leakage with coughing/laughing/movement/etc: *Try to do a set of the exercises 3 times a day. *For each set, do the following about 10 times: ?Squeeze your pelvic muscles (like you are trying to stop peeing or passing gas) ?Hold the muscles tight for about 10 seconds. ?Relax the muscles completely. -Start bladder training with "timed voiding" to help regain bladder control: Start by going to the toilet and trying to urinate every 1-2 hours. Try to urinate whether you feel the need or not. Keep this schedule until you can go 1 day without urinary leakage. Then, increase the time by 15 minutes each day until you can go 3-4 hours between trips to the toilet.

## 2022-04-13 NOTE — Assessment & Plan Note (Signed)
>>  ASSESSMENT AND PLAN FOR MIXED STRESS AND URGE URINARY INCONTINENCE WRITTEN ON 04/13/2022  2:07 PM BY LAU, GRACE, MD  Reporting episodes of urinary incontinence (that preceded recent onset of acute back pain) consistent with mixed stress and urge incontinence. We have discussed bladder training techniques including Kegel exercises and timed voiding. I have encouraged her to start these exercises and we will f/u at her next visit.

## 2022-04-13 NOTE — Assessment & Plan Note (Signed)
Reporting episodes of urinary incontinence (that preceded recent onset of acute back pain) consistent with mixed stress and urge incontinence. We have discussed bladder training techniques including Kegel exercises and timed voiding. I have encouraged her to start these exercises and we will f/u at her next visit.

## 2022-04-15 ENCOUNTER — Ambulatory Visit (INDEPENDENT_AMBULATORY_CARE_PROVIDER_SITE_OTHER): Payer: Medicare PPO | Admitting: Student

## 2022-04-15 ENCOUNTER — Encounter: Payer: Self-pay | Admitting: Student

## 2022-04-15 ENCOUNTER — Other Ambulatory Visit (HOSPITAL_COMMUNITY): Payer: Self-pay

## 2022-04-15 VITALS — BP 128/58 | HR 72 | Temp 97.9°F | Ht 63.0 in | Wt 255.2 lb

## 2022-04-15 DIAGNOSIS — G4733 Obstructive sleep apnea (adult) (pediatric): Secondary | ICD-10-CM | POA: Diagnosis not present

## 2022-04-15 DIAGNOSIS — F40240 Claustrophobia: Secondary | ICD-10-CM

## 2022-04-15 DIAGNOSIS — M2550 Pain in unspecified joint: Secondary | ICD-10-CM

## 2022-04-15 DIAGNOSIS — R9089 Other abnormal findings on diagnostic imaging of central nervous system: Secondary | ICD-10-CM | POA: Diagnosis not present

## 2022-04-15 DIAGNOSIS — N3946 Mixed incontinence: Secondary | ICD-10-CM

## 2022-04-15 DIAGNOSIS — M545 Low back pain, unspecified: Secondary | ICD-10-CM | POA: Diagnosis not present

## 2022-04-15 DIAGNOSIS — G8929 Other chronic pain: Secondary | ICD-10-CM

## 2022-04-15 DIAGNOSIS — Z7409 Other reduced mobility: Secondary | ICD-10-CM

## 2022-04-15 DIAGNOSIS — M5412 Radiculopathy, cervical region: Secondary | ICD-10-CM

## 2022-04-15 MED ORDER — LORAZEPAM 1 MG PO TABS
ORAL_TABLET | ORAL | 0 refills | Status: DC
  Filled 2022-04-15 – 2022-05-22 (×2): qty 1, 1d supply, fill #0

## 2022-04-15 NOTE — Progress Notes (Signed)
Subjective:  CC: back pain  HPI:  [name] is a 67 y.o. female with a past medical history stated below and presents today for in-person follow up evaluation of back pain and urinary incontinence. Please see problem based assessment and plan for additional details.  Past Medical History:  Diagnosis Date   Acute cholecystitis 01/21/2021   AKI (acute kidney injury) ([place]) 12/04/2018   Back pain    chronic low back pain L4-5 discectomy:11/99. degenerative thoracic spondylotic changes 4/07   Chest pain    neg adenosine myoview.   Hypertension    no LVH EKG-7/05, nl M/C ratio 4/07   Hypothyroidism    09/1998   Lower extremity edema    echo EF 55-65% w/o evidence of Dias dysfx.,    Menorrhagia    Microcytic anemia    09/1998. Hb-10.5/MCV 76. needs ferritin to determine ACD vs IDA   Morbid obesity ([place])    Obstructive sleep apnea    severe 7/05 RD 161 per hr. /CPAP 18cwp   Plantar fasciitis of left foot 12/16/2020   Pneumonia due to COVID-19 virus 12/04/2018   Polyarthralgia    (knee/back/ankle) w/dx of fibromyalgia? given by [facility] rheum, knee pain chronic 2/2 obesity, fibromyalgia and Sjogren's.   Primary Sjogren's syndrome ([place])    anti Ro+;ANA>1:1280 in homogen pattern, negative ds DNA/RF/anti Smith/RNP/C3-4 comp/la/jo1/Scleroderma/centromere, neg HIV/ACE/Hep B/C, nl CXR 2/05, schirmer salivary glandtest not done, symptom rx:eye drops/prednisone/plaquenil referral to [facility] rheum, Dr. Wynelle Cleveland 3/07. saw [name] but stopped 2/2 cost.   Uterine bleeding    Viral URI with cough 11/03/2017    Current Outpatient Medications on File Prior to Visit  Medication Sig Dispense Refill   albuterol (PROVENTIL HFA;VENTOLIN HFA) 108 (90 Base) MCG/ACT inhaler Inhale 1-2 puffs every 6 (six) hours as needed into the lungs for wheezing or shortness of breath. 1 Inhaler 0   amLODipine (NORVASC) 10 MG tablet Take 1 tablet (10 mg total) by mouth daily. 30 tablet 11    amoxicillin-clavulanate (AUGMENTIN) 875-125 MG tablet Take 1 tablet by mouth 2 (two) times daily. 10 tablet 0   APPLE CIDER VINEGAR PO Take 1 tablet by mouth daily. Gummy [place]     atorvastatin (LIPITOR) 80 MG tablet Take 1 tablet (80 mg total) by mouth daily. 90 tablet 3   chlorthalidone (HYGROTON) 25 MG tablet Take 1 tablet (25 mg total) by mouth daily. 90 tablet 3   diclofenac Sodium (VOLTAREN) 1 % GEL Apply 4 g topically every 6 (six) hours as needed (arthritis pain). 150 g 0   gabapentin (NEURONTIN) 300 MG capsule Take 1 capsule (300 mg total) by mouth at bedtime. 30 capsule 0   HYDROcodone-acetaminophen (NORCO/VICODIN) 5-325 MG tablet Take 1 tablet by mouth 2 (two) times daily as needed for severe pain. 30 tablet 0   hydroxychloroquine (PLAQUENIL) 200 MG tablet Take 1 tablet (200 mg total) by mouth daily. 60 tablet 2   levothyroxine (SYNTHROID) 50 MCG tablet Take 1 tablet (50 mcg total) by mouth daily. 90 tablet 3   lidocaine (LIDODERM) 5 % Place 1 patch onto the skin every 12 (twelve) hours. Remove & Discard patch within 12 hours or as directed by MD 10 patch 0   losartan (COZAAR) 100 MG tablet Take 1 tablet (100 mg total) by mouth daily. 90 tablet 3   metoprolol succinate (TOPROL-XL) 50 MG 24 hr tablet Take 1 tablet (50 mg total) by mouth daily. Take with or immediately following a meal. 90 tablet 3  Mouthwashes (BIOTENE DRY MOUTH GENTLE) LIQD Use as directed 15 mLs in the mouth or throat 3 (three) times daily as needed (Swish and spit up to three times daily as needed.). 473 mL 0   No current facility-administered medications on file prior to visit.    Family History  Problem Relation Age of Onset   Hypertension Mother    Cancer Mother    Cancer Father    Cancer Sister    Ovarian cancer Other        family history   Breast cancer Other        family history in 1st degree relatives.   Thyroid disease Sister    Colon cancer Neg Hx    Stomach cancer Neg Hx     Social History    Socioeconomic History   Marital status: Married    Spouse name: Not on file   Number of children: Not on file   Years of education: Not on file   Highest education level: Not on file  Occupational History   Occupation: house wife    Employer: UNEMPLOYED  Tobacco Use   Smoking status: Never    Passive exposure: Current   Smokeless tobacco: Never  Vaping Use   Vaping Use: Never used  Substance and Sexual Activity   Alcohol use: No    Alcohol/week: 0.0 standard drinks of alcohol   Drug use: No   Sexual activity: Not on file  Other Topics Concern   Not on file  Social History Narrative   Her daughter Nancy [name]) often comes with her to visits. She also has a grand daughter and great grand daughter(whose name is Investment banker, operational)   Social Determinants of Radio broadcast assistant Strain: Not on file  Food Insecurity: Not on file  Transportation Needs: Not on file  Physical Activity: Not on file  Stress: Not on file  Social Connections: Not on file  Intimate Partner Violence: Not on file    Review of Systems: ROS negative except for what is noted on the assessment and plan.  Objective:   Vitals:   04/15/22 1015 04/15/22 1016  BP: (!) 187/96 (!) 128/58  Pulse: 79 72  Temp: 97.9 F (36.6 C)   TempSrc: Oral   SpO2: 97%   Weight: 255 lb 3.2 oz (115.8 kg)   Height: 5' 3"$  (1.6 m)     Physical Exam: Constitutional: well-appearing woman sitting/laying on exam bed, in mild distress HENT: normocephalic atraumatic, mucous membranes moist Eyes: conjunctiva non-erythematous Neck: supple Cardiovascular: regular rate and rhythm, no m/r/g, radial pulses 2+ and symmetrical Pulmonary/Chest: normal work of breathing on room air, lungs clear to auscultation bilaterally. No discomfort with deep inspiration on L side of body. Abdominal: soft, non-tender, non-distended MSK: + Spurling test on the L with reproduction of sharp, electrical pain and numbness of the L arm, negative on the  R.  Back: No tenderness to mild palpation. Discomfort to deep palpation from  top L shoulder to L hip muscles. No overt bony tenderness over spinous processes or shoulder joint. No paraspinal muscle tenderness. No CVA tenderness.   LE: Negative straight leg test on R and L  Neurological: alert & oriented x 3, 5/5 strength in bilateral upper extremities 4/5 in lower extremities, with increased discomfort over L knee. Sensation was present and symmetrical in upper and lower extremities. Triceps and brachioradialis reflex 2+ and symmetrical. Antalgic gait with ambulatory support.  Skin: warm and dry --Back: No rashes, erythema, or ecchymoses Psych: Pleasant  mood and affect       04/15/2022   10:18 AM  Depression screen PHQ 2/9  Decreased Interest 0  Down, Depressed, Hopeless 0  PHQ - 2 Score 0        No data to display           Assessment & Plan:   Acute exacerbation of chronic low back pain Nancy [name] presents today for in-person evaluation of L sided, shoulder to hip back pain. She described this pain as a deep ache that does not radiate to the front side or cross the midline of her back. She experiences it at rest and it is worsened when she lies on her L side.   This pain has been present for about 4 weeks now. No trauma. Only other association to time of onset was a Shingrix vaccine.   Nancy [name] describes she is mostly in her wheelchair during the day, and favors her L side. She is unable to lay flat on her back 2/2 surgical changes, and usually lays down on her L side to sleep. This is when the pain is worse. She also describes that when she lays on her side with her arm under her pillow, she expreriences pins and needles and a sharp sensation down her L arm that is not the same as the back pain she has experienced.  She was seen on Telehealth 2 days prior to this encounter, and was prescribed Gabapentin. She has only tried one dose and it brought her back, achey pain from 10  to ~6. In addition to this, she is also doing Tylenol 500 mg q8hours. She stopped using BC powder. She has been taking only one dose of her chronic opiod medications for polyarthralgia.  Other than her ongoing urge/stress incontinence, patient denies saddle anesthesia or fecal incontinence, long term steroid or AC use, IV drugs, worsening of the pain at night, fevers, or paralysis.    MSK: + Spurling test on the L with reproduction of sharp, electrical pain and numbness of the L arm, negative on the R. Back: No tenderness to mild palpation. Discomfort to deep palpation from  top L shoulder to L hip muscles. No overt bony tenderness over spinous processes or shoulder joint. No paraspinal muscle tenderness. No CVA tenderness.  LE: Negative straight leg test on R and L  Neurological: alert & oriented x 3, 5/5 strength in bilateral upper extremities 4/5 in lower extremities, with increased discomfort over L knee. Sensation was present and symmetrical in upper and lower extremities. Triceps and brachioradialis reflex 2+ and symmetrical. Antalgic gait with ambulatory support. Skin: warm and dry --Back: No rashes, erythema, or ecchymoses  Given the constellation of her symptoms, suspect this is musculoskeletal pain vs neuropathic pain given lack of dermatomal distribution, pain characteristics, and risk factors: lack of mobility and obesity. The lack of motor symptoms decrease my clinical suspicion for syringomyelia (patient with abnormal CT concerning for chiari I malformation) or incomplete spinal cord syndromes.   Cervical disc prolapse/herniation is high on my differential given radicular symptoms and signs on exam, but this would not explain Nancy [name]'s unilateral back pain.  Will continue to manage as musculoskeletal strain and follow up for improvement  Plan: - Start Tylenol 1000 mg q8HR - Continue ibuprofen 400-600 mg prn  - Chronic hydrocodone-acetaminophen up to two times daily maximum -  Patient will follow with Dr. Saverio Danker   Abnormal CT of brain Patient will need to be administered with anxiolytic for  MRI cervical spine - will further evaluate with MRI brain given abnormal CT head  -MRI brain w wo contrast  Mixed stress and urge incontinence Patient continues to express doscomfort with incontinence. Upon further questioning, she is presenting with urge>stress incontinence symptoms. She reports she pees "a lot" but unable to quantify it. Denies polydipsia, changes in vision, or catabolic symptoms.   She has tried doing some Kegel exercises but "it has not worked yet". Patient has not tried voiding trials since last suggested by Dr. Saverio Danker.  Discussed the need to attempt voiding trials and continue doing Kegel exercises. Patient is amenable to trying prior to trying pharmacotherapy for urge incontinence with Mirabegron vs Vibegron -Instruction for exercises and voiding trial provided  Obstructive sleep apnea Patient expressed increased daytime sleepiness that she attributes to differences in pressure settings to her CPAP machine. She is requesting a new titration study to ensure she has right settings -Referral to sleep study placed    Return in about 4 weeks (around 05/13/2022), or if symptoms worsen or fail to improve, for Back pain and urinary incontinence .  Patient seen with Dr. Gladstone Pih, MD [facility] Internal Medicine Program - PGY-1 04/17/2022, 7:44 PM

## 2022-04-15 NOTE — Patient Instructions (Addendum)
[name], today we discussed  Your back pain Continue the Gabapentin 300 mg at night Please do the Tylenol/Acetaminophen 1000 mg every 8 hours Only use the Hydrocodone up to 2 days max  For your arm pain We would like to get a picture of your neck called an MRI, we will call a medication to help with the nerves  For urinary symptoms -Start Kegel exercises to help with leakage with coughing/laughing/movement/etc: *Try to do a set of the exercises 3 times a day. *For each set, do the following about 10 times: ?Squeeze your pelvic muscles (like you are trying to stop peeing or passing gas) ?Hold the muscles tight for about 10 seconds. ?Relax the muscles completely.  -Start bladder training with "timed voiding" to help regain bladder control: Start by going to the toilet and trying to urinate every 1-2 hours. Try to urinate whether you feel the need or not.  Keep this schedule until you can go 1 day without urinary leakage.  For your CPAP - we placed an order for a titration study to help with the pressures -Please use the flonase and the nasal irrigation top help with the congestion

## 2022-04-17 ENCOUNTER — Encounter: Payer: Self-pay | Admitting: Student

## 2022-04-17 DIAGNOSIS — M5412 Radiculopathy, cervical region: Secondary | ICD-10-CM | POA: Insufficient documentation

## 2022-04-17 DIAGNOSIS — Z7409 Other reduced mobility: Secondary | ICD-10-CM | POA: Insufficient documentation

## 2022-04-17 NOTE — Assessment & Plan Note (Signed)
Patient will need to be administered with anxiolytic for MRI cervical spine - will further evaluate with MRI brain given abnormal CT head  -MRI brain w wo contrast

## 2022-04-17 NOTE — Assessment & Plan Note (Signed)
>>  ASSESSMENT AND PLAN FOR ACUTE EXACERBATION OF CHRONIC LOW BACK PAIN WRITTEN ON 04/17/2022  7:43 PM BY Romana Juniper, MD  Ms. Micciche presents today for in-person evaluation of L sided, shoulder to hip back pain. She described this pain as a deep ache that does not radiate to the front side or cross the midline of her back. She experiences it at rest and it is worsened when she lies on her L side.   This pain has been present for about 4 weeks now. No trauma. Only other association to time of onset was a Shingrix vaccine.   Ms. Tillery describes she is mostly in her wheelchair during the day, and favors her L side. She is unable to lay flat on her back 2/2 surgical changes, and usually lays down on her L side to sleep. This is when the pain is worse. She also describes that when she lays on her side with her arm under her pillow, she expreriences pins and needles and a sharp sensation down her L arm that is not the same as the back pain she has experienced.  She was seen on Telehealth 2 days prior to this encounter, and was prescribed Gabapentin. She has only tried one dose and it brought her back, achey pain from 10 to ~6. In addition to this, she is also doing Tylenol 500 mg q8hours. She stopped using BC powder. She has been taking only one dose of her chronic opiod medications for polyarthralgia.  Other than her ongoing urge/stress incontinence, patient denies saddle anesthesia or fecal incontinence, long term steroid or AC use, IV drugs, worsening of the pain at night, fevers, or paralysis.    MSK: + Spurling test on the L with reproduction of sharp, electrical pain and numbness of the L arm, negative on the R. Back: No tenderness to mild palpation. Discomfort to deep palpation from  top L shoulder to L hip muscles. No overt bony tenderness over spinous processes or shoulder joint. No paraspinal muscle tenderness. No CVA tenderness.  LE: Negative straight leg test on R and  L  Neurological: alert & oriented x 3, 5/5 strength in bilateral upper extremities 4/5 in lower extremities, with increased discomfort over L knee. Sensation was present and symmetrical in upper and lower extremities. Triceps and brachioradialis reflex 2+ and symmetrical. Antalgic gait with ambulatory support. Skin: warm and dry --Back: No rashes, erythema, or ecchymoses  Given the constellation of her symptoms, suspect this is musculoskeletal pain vs neuropathic pain given lack of dermatomal distribution, pain characteristics, and risk factors: lack of mobility and obesity. The lack of motor symptoms decrease my clinical suspicion for syringomyelia (patient with abnormal CT concerning for chiari I malformation) or incomplete spinal cord syndromes.   Cervical disc prolapse/herniation is high on my differential given radicular symptoms and signs on exam, but this would not explain Ms. Centola's unilateral back pain.  Will continue to manage as musculoskeletal strain and follow up for improvement  Plan: - Start Tylenol 1000 mg q8HR - Continue ibuprofen 400-600 mg prn  - Chronic hydrocodone-acetaminophen up to two times daily maximum - Patient will follow with Dr. Saverio Danker

## 2022-04-17 NOTE — Assessment & Plan Note (Addendum)
Nancy [name] presents today for in-person evaluation of L sided, shoulder to hip back pain. She described this pain as a deep ache that does not radiate to the front side or cross the midline of her back. She experiences it at rest and it is worsened when she lies on her L side.   This pain has been present for about 4 weeks now. No trauma. Only other association to time of onset was a Shingrix vaccine.   Nancy [name] describes she is mostly in her wheelchair during the day, and favors her L side. She is unable to lay flat on her back 2/2 surgical changes, and usually lays down on her L side to sleep. This is when the pain is worse. She also describes that when she lays on her side with her arm under her pillow, she expreriences pins and needles and a sharp sensation down her L arm that is not the same as the back pain she has experienced.  She was seen on Telehealth 2 days prior to this encounter, and was prescribed Gabapentin. She has only tried one dose and it brought her back, achey pain from 10 to ~6. In addition to this, she is also doing Tylenol 500 mg q8hours. She stopped using BC powder. She has been taking only one dose of her chronic opiod medications for polyarthralgia.  Other than her ongoing urge/stress incontinence, patient denies saddle anesthesia or fecal incontinence, long term steroid or AC use, IV drugs, worsening of the pain at night, fevers, or paralysis.    MSK: + Spurling test on the L with reproduction of sharp, electrical pain and numbness of the L arm, negative on the R. Back: No tenderness to mild palpation. Discomfort to deep palpation from  top L shoulder to L hip muscles. No overt bony tenderness over spinous processes or shoulder joint. No paraspinal muscle tenderness. No CVA tenderness.  LE: Negative straight leg test on R and L  Neurological: alert & oriented x 3, 5/5 strength in bilateral upper extremities 4/5 in lower extremities, with increased discomfort over L knee.  Sensation was present and symmetrical in upper and lower extremities. Triceps and brachioradialis reflex 2+ and symmetrical. Antalgic gait with ambulatory support. Skin: warm and dry --Back: No rashes, erythema, or ecchymoses  Given the constellation of her symptoms, suspect this is musculoskeletal pain vs neuropathic pain given lack of dermatomal distribution, pain characteristics, and risk factors: lack of mobility and obesity. The lack of motor symptoms decrease my clinical suspicion for syringomyelia (patient with abnormal CT concerning for chiari I malformation) or incomplete spinal cord syndromes.   Cervical disc prolapse/herniation is high on my differential given radicular symptoms and signs on exam, but this would not explain Nancy [name]'s unilateral back pain.  Will continue to manage as musculoskeletal strain and follow up for improvement  Plan: - Start Tylenol 1000 mg q8HR - Continue ibuprofen 400-600 mg prn  - Chronic hydrocodone-acetaminophen up to two times daily maximum - Patient will follow with Dr. Saverio Danker

## 2022-04-17 NOTE — Assessment & Plan Note (Addendum)
Patient expressed increased daytime sleepiness that she attributes to differences in pressure settings to her CPAP machine.   Last titration study was in 2018. Will place order for titration study to ensure patient has right settings -Referral to sleep study placed

## 2022-04-17 NOTE — Assessment & Plan Note (Signed)
Patient continues to express doscomfort with incontinence. Upon further questioning, she is presenting with urge>stress incontinence symptoms. She reports she pees "a lot" but unable to quantify it. Denies polydipsia, changes in vision, or catabolic symptoms.   She has tried doing some Kegel exercises but "it has not worked yet". Patient has not tried voiding trials since last suggested by Dr. Saverio Danker.  Discussed the need to attempt voiding trials and continue doing Kegel exercises. Patient is amenable to trying prior to trying pharmacotherapy for urge incontinence with Mirabegron vs Vibegron -Instruction for exercises and voiding trial provided

## 2022-04-20 NOTE — Addendum Note (Signed)
Addended by: Charise Killian on: 04/20/2022 09:00 AM   Modules accepted: Level of Service

## 2022-04-20 NOTE — Progress Notes (Signed)
[facility] Attending  Case discussed with Dr. Simeon Craft  At the time of the visit.  We reviewed the resident's history and exam and pertinent patient test results.  I agree with the assessment, diagnosis, and plan of care documented in the resident's note. Mixed picture of muscular left-sided back pain and c/f left cervical radiculopathy. MRI C-spine ordered by Dr. Simeon Craft. We have also discussed obtaining MRI brain to further characterize previous abnormal CT findings concerning for Chiari 1 malformation given inability to obtain this earlier. Ativan prescribed for claustrophobia related to MRI scanner.

## 2022-04-27 ENCOUNTER — Other Ambulatory Visit (HOSPITAL_COMMUNITY): Payer: Self-pay

## 2022-04-29 ENCOUNTER — Ambulatory Visit: Payer: Medicare PPO | Attending: Internal Medicine

## 2022-04-29 DIAGNOSIS — G8929 Other chronic pain: Secondary | ICD-10-CM | POA: Diagnosis present

## 2022-04-29 DIAGNOSIS — M25562 Pain in left knee: Secondary | ICD-10-CM | POA: Diagnosis not present

## 2022-04-29 DIAGNOSIS — M25662 Stiffness of left knee, not elsewhere classified: Secondary | ICD-10-CM | POA: Diagnosis present

## 2022-04-29 DIAGNOSIS — R262 Difficulty in walking, not elsewhere classified: Secondary | ICD-10-CM | POA: Insufficient documentation

## 2022-04-29 DIAGNOSIS — M6281 Muscle weakness (generalized): Secondary | ICD-10-CM | POA: Insufficient documentation

## 2022-04-29 NOTE — Therapy (Signed)
OUTPATIENT PHYSICAL THERAPY WHEELCHAIR EVALUATION   Patient Name: Nancy [name] MRN: [id number] DOB:1955-04-07, 67 y.o., female Today's Date: 04/29/2022  END OF SESSION:  PT End of Session - 04/29/22 1004     Visit Number 1    Number of Visits 1    PT Start Time 1010    PT Stop Time 1055    PT Time Calculation (min) 45 min    Equipment Utilized During Treatment Gait belt    Activity Tolerance Patient tolerated treatment well    Behavior During Therapy WFL for tasks assessed/performed             Past Medical History:  Diagnosis Date   Acute cholecystitis 01/21/2021   AKI (acute kidney injury) ([place]) 12/04/2018   Back pain    chronic low back pain L4-5 discectomy:11/99. degenerative thoracic spondylotic changes 4/07   Chest pain    neg adenosine myoview.   Hypertension    no LVH EKG-7/05, nl M/C ratio 4/07   Hypothyroidism    09/1998   Lower extremity edema    echo EF 55-65% w/o evidence of Dias dysfx.,    Menorrhagia    Microcytic anemia    09/1998. Hb-10.5/MCV 76. needs ferritin to determine ACD vs IDA   Morbid obesity ([place])    Obstructive sleep apnea    severe 7/05 RD 161 per hr. /CPAP 18cwp   Plantar fasciitis of left foot 12/16/2020   Pneumonia due to COVID-19 virus 12/04/2018   Polyarthralgia    (knee/back/ankle) w/dx of fibromyalgia? given by [facility] rheum, knee pain chronic 2/2 obesity, fibromyalgia and Sjogren's.   Primary Sjogren's syndrome ([place])    anti Ro+;ANA>1:1280 in homogen pattern, negative ds DNA/RF/anti Smith/RNP/C3-4 comp/la/jo1/Scleroderma/centromere, neg HIV/ACE/Hep B/C, nl CXR 2/05, schirmer salivary glandtest not done, symptom rx:eye drops/prednisone/plaquenil referral to [facility] rheum, Dr. Wynelle Cleveland 3/07. saw [name] but stopped 2/2 cost.   Uterine bleeding    Viral URI with cough 11/03/2017   Past Surgical History:  Procedure Laterality Date   CHOLECYSTECTOMY N/A 01/23/2021   Procedure: LAPAROSCOPIC CHOLECYSTECTOMY;  Surgeon:  Georganna Skeans, MD;  Location: [place];  Service: General;  Laterality: N/A;   EXAMINATION UNDER ANESTHESIA  11/23/2011   Procedure: EXAM UNDER ANESTHESIA;  Surgeon: Osborne Oman, MD;  Location: [facility];  Service: Gynecology;  Laterality: N/A;   HYSTEROSCOPY WITH D & C  11/23/2011   Procedure: DILATATION AND CURETTAGE /HYSTEROSCOPY;  Surgeon: Osborne Oman, MD;  Location: [facility];  Service: Gynecology;  Laterality: N/A;   LUMBAR FUSION  1999   SHOULDER SURGERY  unk   TONSILLECTOMY     TUBAL LIGATION  1977   WISDOM TOOTH EXTRACTION     Patient Active Problem List   Diagnosis Date Noted   Impaired mobility 04/17/2022   Cervical radiculopathy 04/17/2022   Acute exacerbation of chronic low back pain 04/13/2022   Alpha thalassaemia minor 04/06/2022   Dental infection 12/22/2021   Bilateral hand pain 07/10/2021   Rheumatoid factor positive 07/10/2021   Hyperlipidemia 04/28/2021   Mixed stress and urge incontinence 12/16/2020   Asymmetrical sensorineural hearing loss 08/09/2019   Polyarthralgia 12/02/2016   GERD (gastroesophageal reflux disease) 04/13/2014   Allergic rhinitis 01/24/2014   Morbid obesity with body mass index (BMI) of 40.0 to 44.9 in adult ([place]) 09/14/2013   Abnormal CT of brain 11/19/2011   Subacute cough 08/15/2009   MACULAR DEGENERATION, BILATERAL 05/22/2008   Essential hypertension 03/17/2006   SICCA SYNDROME 03/17/2006   KNEE PAIN, CHRONIC 03/17/2006  Chronic low back pain 03/17/2006   Obstructive sleep apnea 09/07/2003   Hypothyroidism 09/07/1998   Microcytic anemia 09/07/1998    PCP: Charise Killian, MD  REFERRING PROVIDER: Charise Killian, MD  THERAPY DIAG:  Difficulty in walking, not elsewhere classified  Muscle weakness (generalized)  Stiffness of left knee, not elsewhere classified  Chronic pain of left knee  Rationale for Evaluation and Treatment Rehabilitation  SUBJECTIVE:                                                                                                                                                                                            SUBJECTIVE STATEMENT: Pt present or wheelchair evaluation. Patient has hx of bil DJD in knees, hips and lower back. Pt had lumbar fusion in 1999 (unsure of what level). She is mostly using her power wheelchair at home that she got about 5 years ago but it is worn and she needs a new one to continue to improve her mobility. Pt lives with husband at home. Pt has ramp in the house.  PRECAUTIONS: Fall  WEIGHT BEARING RESTRICTIONS No    OCCUPATION: Retired  PLOF:  Independent with homemaking with wheelchair and Requires assistive device for independence  PATIENT GOALS: Obtain a new wheelchair as the old one is worn out         MEDICAL HISTORY:  Primary diagnosis onset:  Diagnosis  Code:  Diagnosis:    Diagnosis code:       Diagnosis:  M25.50 (ICD-10-CM) - Polyarthralgia Diagnosis  Code:  Diagnosis:   Progressive disease  Yes Relevant future surgeries:     Height:  5' 3"$  Weight:  250 lbs Explain recent changes or trends in weight:      History:  Past Medical History:  Diagnosis Date   Acute cholecystitis 01/21/2021   AKI (acute kidney injury) ([place]) 12/04/2018   Back pain    chronic low back pain L4-5 discectomy:11/99. degenerative thoracic spondylotic changes 4/07   Chest pain    neg adenosine myoview.   Hypertension    no LVH EKG-7/05, nl M/C ratio 4/07   Hypothyroidism    09/1998   Lower extremity edema    echo EF 55-65% w/o evidence of Dias dysfx.,    Menorrhagia    Microcytic anemia    09/1998. Hb-10.5/MCV 76. needs ferritin to determine ACD vs IDA   Morbid obesity ([place])    Obstructive sleep apnea    severe 7/05 RD 161 per hr. /CPAP 18cwp   Plantar fasciitis of left foot 12/16/2020   Pneumonia due to COVID-19 virus 12/04/2018   Polyarthralgia    (knee/back/ankle) w/dx of  fibromyalgia? given by [facility] rheum, knee pain chronic 2/2 obesity, fibromyalgia and  Sjogren's.   Primary Sjogren's syndrome ([place])    anti Ro+;ANA>1:1280 in homogen pattern, negative ds DNA/RF/anti Smith/RNP/C3-4 comp/la/jo1/Scleroderma/centromere, neg HIV/ACE/Hep B/C, nl CXR 2/05, schirmer salivary glandtest not done, symptom rx:eye drops/prednisone/plaquenil referral to [facility] rheum, Dr. Wynelle Cleveland 3/07. saw [name] but stopped 2/2 cost.   Uterine bleeding    Viral URI with cough 11/03/2017            Cardio Status:  Functional Limitations:   [x]$ Intact  []$  Impaired      Respiratory Status:  Functional Limitations:   [x]$ Intact  []$ Impaired   []$ SOB []$ COPD []$ O2 Dependent ______LPM  []$ Ventilator Dependent  Resp equip:                                                     Objective Measure(s):   Orthotics:   []$ Amputee:                                                             []$ Prosthesis:        HOME ENVIRONMENT:  [x]$ House []$ Condo/town home []$ Apartment []$ Asst living []$ LTCF         []$ Own  []$ Rent   []$ Lives alone [x]$ Lives with others -        husband                     Hours without assistance: 0  [x]$ Home is accessible to patient                                 Storage of wheelchair:  [x]$ In home   []$ Other Comments:        COMMUNITY :  TRANSPORTATION:  []$ Car []$ Van []$ Public Transportation []$ Adapted w/c Lift []$  Ambulance [x]$ Other:     Truck with ramp                []$ Sits in wheelchair during transport   Where is w/c stored during transport?  []$ [place]  []$  [place]  r   []$ Self-Driver       Drive while in  [address] []$ yes [x]$ no   Employment and/or school:  Specific requirements pertaining to mobility        Other:  COMMUNICATION:  Verbal Communication  [x]$ WFL []$ receptive []$ WFL []$ expressive []$ Understandable  []$ Difficult to understand  []$ non-communicative  Primary Language:___English___________ 2nd:_____________  Communication provided by:[x]$ Patient []$ Family []$ Caregiver []$ Translator   []$ Uses an augmentative communication device     Manufacturer/Model :                                                                 MOBILITY/BALANCE:  Sitting Balance  Standing Balance  Transfers  Ambulation   [x]$ WFL      []$ WFL  []$ Independent  []$  Independent   []$ Uses UE for balance in sitting Comments:  [  x]Uses UE/device for stability Comments:  [x]$  Min assist  []$  Ambulates independently with       device:__R_________________      []$  Mod assist  [x]$  Able to ambulate ____20__ feet        safely/functionally/independently   []$  Min assist  []$  Min assist  []$  Max assist  []$  Non-functional ambulator         History/High risk of falls   []$  Mod assist  []$  Mod assist  []$  Dependent  []$  Unable to ambulate   []$  Max  assist  []$  Max assist  Transfer method:[]$ 1 person []$ 2 person []$ sliding board []$ squat pivot []$ stand pivot []$ mechanical patient lift  []$ other:   []$  Unable  []$  Unable    Fall History: # of falls in the past 6 months? 0 # of "near" falls in the past 6 months? Once a month due to knees giving away    CURRENT SEATING / MOBILITY:  Current Mobility Device: []$ None []$ Cane/Walker []$ Manual []$ Dependent []$ Dependent w/ Tilt rScooter  [x]$ Power (type of control):   Manufacturer: Theatre manager: [employer] Serial #:   Size:  Color:  Age:   Purchased by whom:   Current condition of mobility base:    Current seating system:                                                                       Age of seating system:    Describe posture in present seating system:    Is the current mobility meeting medical necessity?:  []$ Yes []$ No Describe:                                     Ability to complete Mobility-Related Activities of Daily Living (MRADL's) with Current Mobility Device:  Power wheelchair Move room to room  [x]$ Independent  []$ Min []$ Mod []$ Max assist  []$ Unable  Comments:   Meal prep  [x]$ Independent  []$ Min []$ Mod []$ Max assist  []$ Unable    Feeding  [x]$ Independent  []$ Min []$ Mod []$ Max assist  []$ Unable    Bathing  [x]$ Independent  []$ Min []$ Mod []$ Max assist  []$ Unable    Grooming   [x]$ Independent  []$ Min []$ Mod []$ Max assist  []$ Unable    UE dressing  [x]$ Independent  []$ Min []$ Mod []$ Max assist  []$ Unable    LE dressing  [x]$ Independent   []$ Min []$ Mod []$ Max assist  []$ Unable    Toileting  [x]$ Independent  []$ Min []$ Mod []$ Max assist  []$ Unable    Bowel Mgt: [x]$  Continent []$  Incontinent []$  Accidents []$  Diapers []$  Colostomy []$  Bowel Program:  Bladder Mgt: [x]$  Continent []$  Incontinent []$  Accidents []$  Diapers []$  Urinal []$  Intermittent Cath []$  Indwelling Cath []$  Supra-pubic Cath     Current Mobility Equipment Trialed/ Ruled Out:    Does not meet mobility needs due to:    [place] all boxes that indicate inability to use the specific equipment listed     Meets needs for safe  independent functional  ambulation  / mobility    Risk of  Falling or History of Falls    Enviromental limitations      Cognition    Safety concerns with  physical ability    Decreased / limitations endurance  &  strength     Decreased / limitations  motor skills  & coordination    Pain    Pace /  Speed    Cardiac and/or  respiratory condition    Contra - indicated by diagnosis                Cane/Crutches  []$   [x]$   []$   []$   []$   []$   []$   []$   []$   []$   [x]$    Walker / Rollator  []$  NA   []$   [x]$   []$   []$   []$   []$   []$   []$   []$   []$   [x]$     Manual Wheelchair [id number]-K0007:  []$  NA  []$   []$   []$   []$   [x]$   [x]$   []$   []$   [x]$   []$   [x]$    Manual W/C (K0005) with power assist  []$  NA  []$   []$   []$   []$   []$   []$   []$   []$   []$   []$   []$    Scooter  []$  NA  []$   []$   []$   []$   []$   []$   []$   []$   []$   []$   []$    Power Wheelchair: standard joystick  []$  NA  [x]$   []$   []$   []$   []$   []$   []$   []$   []$   []$   []$    Power Wheelchair: alternative controls  []$  NA  []$   []$   []$   []$   []$   []$   []$   []$   []$   []$   []$    Summary:  The least costly alternative for independent functional mobility was found to be:    []$  Crutch/Cane  []$  Walker []$  Manual w/c  []$  Manual w/c with power assist   []$  Scooter   [x]$  Power w/c std joystick   []$  Power w/c alternative control        []$  Requires  dependent care mobility device   Media planner for [employer] skills are adequate for safe mobility equipment operation  [x]$   Yes []$   No  Patient is willing and motivated to use recommended mobility equipment  [x]$   Yes []$   No       []$  Patient is unable to safely operate mobility equipment independently and requires dependent care equipment Comments:           SENSATION and SKIN ISSUES:  Sensation [x]$  Intact  []$  Impaired []$  Absent []$  Hyposensate []$  Hypersensate  []$  Defensiveness  Location(s) of impairment:    Pressure Relief Method(s):  [x]$  Lean side to side to offload (without risk of falling)  [x]$   W/C push up (4+ times/hour for 15+ seconds) []$  Stand up (without risk of falling)    []$  Other: (Describe): Effective pressure relief method(s) above can be performed consistently throughout the day: rYes  r No If not, Why?:  Skin Integrity Risk:       [x]$  Low risk           []$  Moderate risk            []$  High risk  If high risk, explain:   Skin Issues/Skin Integrity  Current skin Issues  []$  Yes [x]$  No [x]$  Intact  []$   Red area   []$   Open area  []$  Scar tissue  []$  At risk from prolonged sitting  Where: History of Skin Issues  []$  Yes [x]$  No Where : When: Stage: Hx of skin flap surgeries  []$  Yes [x]$  No Where:  When:  Pain: [x]$  Yes []$  No   Pain Location(s): knees, hips back Intensity scale: (0-10) : 10/10 How does pain interfere with mobility and/or MRADLs? - difficulty with prolonged standing, walking, transfers which interfers with maintaining independence without proper assistive device/mobility device.        MAT EVALUATION:  Neuro-Muscular Status: (Tone, Reflexive, Responses, etc.)     [x]$   Intact   []$  Spasticity:  []$  Hypotonicity  []$  Fluctuating  []$  Muscle Spasms  []$  Poor Righting Reactions/Poor Equilibrium Reactions  []$  Primal Reflex(s):    Comments:            COMMENTS:    POSTURE:     Comments:  Pelvis  Anterior/Posterior:  [x]$  Neutral   []$  Posterior  []$  Anterior  []$  Fixed - No movement []$  Tendency away from neutral []$  Flexible []$  Self-correction []$  External correction Obliquity (viewed from front)  [x]$  WFL []$  R Obliquity []$  L Obliquity  []$  Fixed - No movement []$  Tendency away from neutral []$  Flexible []$  Self-correction []$  External correction Rotation  [x]$  WFL []$  R anterior []$  L anterior  []$  Fixed - No movement []$  Tendency away from neutral []$  Flexible []$  Self-correction []$  External correction Tonal Influence Pelvis:  [x]$  Normal []$  Flaccid []$  Low tone []$  Spasticity []$  Dystonia []$  Pelvis thrust []$  Other:    Trunk Anterior/Posterior:  [x]$  WFL []$  Thoracic kyphosis []$  Lumbar lordosis  []$  Fixed - No movement []$  Tendency away from neutral []$  Flexible []$  Self-correction []$  External correction  [x]$  WFL []$  Convex to left  []$  Convex to right []$  S-curve   []$  C-curve []$  Multiple curves []$  Tendency away from neutral []$  Flexible []$  Self-correction []$  External correction Rotation of shoulders and upper trunk:  [x]$  Neutral []$  Left-anterior []$  Right- anterior []$  Fixed- no movement []$  Tendency away from neutral []$  Flexible []$  Self correction []$  External correction Tonal influence Trunk:  [x]$  Normal []$  Flaccid []$  Low tone []$  Spasticity []$  Dystonia []$  Other:   Head & Neck  [x]$  Functional []$  Flexed    []$  Extended []$  Rotated right  []$  Rotated left []$  Laterally flexed right []$  Laterally flexed left []$  Cervical hyperextension   [x]$  Good head control []$  Adequate head control []$  Limited head control []$  Absent head control Describe tone/movement of head and neck:      Lower Extremity Measurements: LE ROM:  Active ROM Right 04/29/2022 Left 04/29/2022  Hip flexion    Hip extension    Hip abduction    Hip adduction    Knee flexion    Knee extension    Ankle dorsiflexion    Ankle plantarflexion     (Blank rows = not tested)  LE MMT:  MMT  Right 04/29/2022 Left 04/29/2022  Hip flexion 5/5 3-/5 pain  Hip extension    Hip abduction    Hip adduction    Knee flexion 4/5 3+/5 pain  Knee extension 4/5 3+/5 pain  Ankle dorsiflexion 5/5 3+/5  Ankle plantarflexion     (Blank rows = not tested)  Hip positions:  [x]$  Neutral   []$  Abducted   []$  Adducted  []$  Subluxed   []$  Dislocated   []$  Fixed   []$  Tendency away from neutral []$  Flexible []$  Self-correction []$  External correction   Hip Windswept:[x]$  Neutral  []$  Right    []$  Left  []$  Subluxed   []$  Dislocated   []$  Fixed   []$  Tendency away from neutral []$  Flexible []$  Self-correction []$  External correction  LE Tone: [x]$  Normal []$  Low tone []$  Spasticity []$  Flaccid []$  Dystonia []$  Rocks/Extends at hip []$  Thrust into knee extension []$  Pushes legs downward into footrest  Foot positioning: ROM Concerns: Dorsiflexed: []$  Right   []$  Left Plantar flexed: []$  Right    []$  Left Inversion: []$  Right    []$  Left Eversion: []$  Right    []$  Left  LE Edema: [x]$  1+ (Barely detectable impression when finger is pressed into skin) []$  2+ (slight indentation. 15 seconds to rebound) []$  3+ (deeper indentation. 30 seconds to rebound) []$  4+ (>30 seconds to rebound)  UE Measurements:  UPPER EXTREMITY ROM:   Active ROM Right 04/29/2022 Left 04/29/2022  Shoulder flexion    Shoulder abduction    Shoulder adduction    Elbow flexion    Elbow extension    Wrist flexion    Wrist extension    (Blank rows = not tested)  UPPER EXTREMITY MMT:  MMT Right 04/29/2022 Left 04/29/2022  Shoulder flexion 3+ 3+  Shoulder abduction 3+ 3+  Shoulder adduction    Elbow flexion 4 4  Elbow extension 4 4  Wrist flexion    Wrist extension    Pinch strength    Grip strength 41 lbs 17.6 lbs  (Blank rows = not tested)  Shoulder Posture:  Right Tendency towards Left  [x]$   Functional [x]$    []$   Elevation []$    []$   Depression []$    []$   Protraction []$    []$   Retraction []$    []$   Internal rotation []$    []$    External rotation []$    []$   Subluxed []$     UE Tone: [x]$  Normal []$  Flaccid []$  Low tone []$  Spasticity  []$  Dystonia []$  Other:   UE Edema: []$  1+ (Barely detectable impression when finger is pressed into skin) []$  2+ (slight indentation. 15 seconds to rebound) []$  3+ (deeper indentation. 30 seconds to rebound) []$  4+ (>30 seconds to rebound)  Wrist/Hand: Handedness: []$  Right   []$  Left   []$  NA: Comments:  Right  Left  []$   WNL []$    []$   Limitations []$    []$   Contractures []$    []$   Fisting []$    []$   Tremors []$    []$   Weak grasp []$    []$   Poor dexterity []$    []$   Hand movement non functional []$    []$   Paralysis []$         MOBILITY BASE RECOMMENDATIONS and JUSTIFICATION:  MOBILITY BASE  JUSTIFICATION   Manufacturer:   Theatre manager:               Select               Color:  Seat Width:  20" Seat Depth 18"   []$  Manual mobility base (continue below)   []$  Scooter/POV  []$  Power mobility base   Number of hours per day spent in above selected mobility base: 18+  Typical daily mobility base use Schedule: during the day, awake hours   [x]$  is not a safe, functional ambulator  [x]$  limitation prevents from completing a MRADL(s) within a reasonable time frame    [x]$  limitation places at high risk of morbidity or mortality secondary to  the attempts to perform a    MRADL(s)  []$  limitation prevents accomplishing a MRADL(s) entirely  [x]$  provide independent mobility  [x]$  equipment is a lifetime medical need  [x]$  walker or cane inadequate  [x]$  any type manual wheelchair  inadequate  []$  scooter/POV inadequate      []$  requires dependent mobility          MANUAL MOBILITY      []$  Standard manual wheelchair  K0001      Arm:    []$  both []$  right  []$  left      Foot:   []$  both []$  right   []$  left  []$  self-propels wheelchair  []$  will use on regular basis  []$  chair fits throughout home  []$  willing and motivated to use  []$  propels with assistance     []$  dependent use   []$  Standard  hemi-manual wheelchair  K0002      Arm:    []$  both []$  right  []$  left      Foot:   []$  both []$  right   []$  left  []$  lower seat height required to foot propel  []$  short stature  []$  self-propels wheelchair  []$  will use on regular basis  []$  chair fits throughout home  []$  willing and motivated to use   []$  propels with assistance  []$  dependent use   []$  Lightweight manual wheelchair  K0003      Arm:    []$  both []$  right  []$  left      Foot:   []$  both  []$  right  []$  left                   []$  hemi height required  []$  medical condition and weight of  wheelchair affect ability to self      propel standard manual wheelchair in the residence  []$  can and does self-propel (marginal propulsion skills)  []$  daily use _________hours  []$  chair fits throughout home  []$  willing and motivated to use  []$  lower seat height required to foot propel  []$  short stature   []$  High strength lightweight manual  wheelchair (Breezy Ultra 4)  K0004     Arm:    []$  both []$  right  []$  left     Foot:   []$  both []$  right   []$  left                                                                  []$  hemi height required []$  medical condition and weight of wheelchair affect ability to self propel while engaging in frequent MRADL(s) that cannot be performed in a standard or lightweight manual wheelchair  []$  daily use _________hours  []$  chair fits throughout home  []$  willing and motivated to use  []$  prevent repetitive use injuries   []$  lower seat height required to foot propel  []$  short stature    []$  Ultra-lightweight manual wheelchair  K0005     Arm:    []$  both []$  right  []$  left     Foot:   []$  both []$  right  []$  left       []$  hemi height required  []$  heavy duty    Front seat to floor _____ inches      Rear seat to floor _____ inches      Back height _____ inches     Back angle ______ degrees      Front angle _____ degrees  []$   full-time  manual wheelchair user  []$  Requires individualized fitting and optimal adjustments  for multiple features that include adjustable axle configuration, fully adjustable center of gravity, wheel camber, seat and back angle, angle of seat slope, which cannot be accommodated by a K0001 through K0004 manual wheelchair  []$  prevent repetitive use injuries  []$  daily use_________hours   []$  user has high activity patterns that frequently require  them  to go out into the community for the purpose of independently accomplishing high level MRADL activities. Examples of these might include a combination of; shopping, work, school, Science writer, childcare, independently loading and unloading from a vehicle etc.  []$  lower seat height required to foot propel  []$  short stature  []$  heavy duty -  weight over 250lbs   []$  Current chair is a K0005   manufacture:___________________  model:_________________  serial#____________________  age:_________    []$  First time [id number] user (complete trial)  K0004 time and # of strokes to propel 30 feet: ________seconds _________strokes  [id number] time and # of strokes to propel 30 feet: ________seconds _________strokes  What was the result of the trial between the K0004 and K0005 manual wheelchair? ___    What features of the K0005 w/c are needed as compared to the K0004 base? Why?___    []$  adjustable seat and back angle changes the angle of seat slope of the frame to attain a gravity assisted position for efficient propulsion and proper weight distribution along the frame     []$  the front of the wheelchair will be configured higher than the back of the chair to allow gravity to assist the user with postural stability  []$  the center of the wheel will be positioned for stability, safety and efficient propulsion  []$  adjustable axle allows for vertical, horizontal, camber and overall width changes  throughout the wheels for adjustment of the client's exact needs and abilities.   []$  adjustable axle increases the stability and function of the chair allowing for  adjustment of the center of gravity.   []$  accommodates the client's anatomical position in the chair maximizing independence in mobility and maneuverability in all environments.   []$  create a minimal fixed tilt-in space to assist in positioning.   []$  Describe users full-time manual wheelchair activity patterns:___    []$  Power assist Comments:  []$  prevent repetitive use injuries  []$  repetitive strain injury present in    shoulder girdle    []$  shoulder pain is (> or =) to 7/10     during manual propulsion       Current Pain _____/10  []$  requires conservation of energy to participate in MRADL(s) runable to propel up ramps or curbs using manual wheelchair  []$  been K0005 user greater than one year  []$  user unwilling to use power      wheelchair (reason): []$  less expensive option to power   wheelchair   []$  rim activated power assist -      decreased strength   []$  Heavy duty manual wheelchair       K0006     Arm:    []$  both []$  right  []$  left     Foot:   []$  both []$  right  []$  left     []$  hemi height required    []$  Dependent base  []$  user exceeds 250lbs  []$  non-functional ambulator    []$  extreme spasticity  []$  over active movement   []$  broken frame/hx of repeated     repairs  []$  able to  self-propel in residence       []$  lower seat to floor height required  []$  unable to self-propel in residence   []$  Extra heavy duty manual wheelchair  K0007     Arm:    []$  both []$  right  []$  left     Foot:   []$  both []$  right  []$  left     []$  hemi height required  []$  Dependent base  []$  user exceeds 300lbs  []$  non-functional ambulator    []$  able to self-propel in residence   []$  lower seat to floor height required  []$  unable to self-propel in residence     []$  Manual wheelchair with tilt [phone]      (Manual "Tilt-n-Space")  []$  patient is dependent for transfers  []$  patient requires frequent       positioning for pressure relief   []$  patient requires frequent      positioning for poor/absent trunk control         []$  Stroller Base  []$  infant/child   []$  unable to propel manual      wheelchair  []$  allows for growth  []$  non-functional ambulator  []$  non-functional UE  []$  independent mobility is not a goal at this time    [place] handles  []$  extended  rangle adjustable   []$  standard  []$  caregiver access  []$  caregiver assist    []$  allows "hooking" to enable      increased ability to perform ADLs or maintain balance   []$  Angle Adjustable Back  []$  postural control  []$  control of tone/spasticity  []$  accommodation of range of motion  []$  UE functional control  []$  accommodation for seating system    Rear wheel placement  []$  std/fixed rfully adjustableramputee   []$  camber ________degree  []$  removable rear wheel  []$  non-removable rear wheel  Wheel size _______  Wheel style_______________________  []$  improved UE access to wheels  []$  increase propulsion ability  []$  improved stability  []$  changing angle in space for      improvement of postural stability  []$  remove for transport    []$  allow for seating system to fit on      base  []$  amputee placement  []$  1-arm drive access   r R  r L  []$  enable propulsion of manual       wheelchair with one arm    []$  amputee placement   Wheel rims/ Hand rims  []$  Standard    []$  Specialized-____ []$  provide ability to propel manual   []$  increase self-propulsion with hand wheelchair weakness/decreased grasp     []$  Spoke protector/guard   []$  prevent hands from getting caught in spokes   Tires:  []$  pneumatic  []$  flat free inserts  []$  solid  Style:  []$  decrease roll resistance              []$  prevent frequent flats  []$  increase shock absorbency  []$  decrease maintenance   []$  decrease pain from road shock    []$  decrease spasms from road shock    Wheel Locks:    []$  push []$  pull []$  scissor  []$  lock wheels for transfers  []$  lock wheels from rolling   Brake/wheel lock extension:  []$  R  []$  L  []$  allow user to operate wheel locks due to decreased reach  or strength   Caster housing:  Caster size:  Style:                                          []$  suspension fork  []$  maneuverability   []$  stability of wheelchair   []$  durability  []$  maintenance  []$  angle adjustment for posture  []$  allow for feet to come under        wheelchair base  []$  allows change in seat to floor      height   []$  increase shock absorbency  []$  decrease pain from road shock  []$  decrease spasms from road    shock   []$  Side guards  []$  prevent clothing getting caught in wheel or becoming soiled  rprovide hip and pelvic stability  []$  eliminates contact between body and wheels  []$  limit hand contact with wheels   []$  Anti-tippers      []$  prevent wheelchair from tipping    backward  []$  assist caregiver with curbs     POWER MOBILITY      []$  Scooter/POV    []$  can safely operate   []$  can safely transfer   []$  has adequate trunk stability   []$  cannot functionally propel  manual wheelchair    []$  Power mobility base    []$  non-ambulatory   []$  cannot functionally propel manual wheelchair   []$  cannot functionally and safely      operate scooter/POV  []$  can safely operate power       wheelchair  []$  home is accessible  []$  willing to use power wheelchair     Tilt  []$  Powered tilt on powered chair  []$  Powered tilt on manual chair  []$  Manual tilt on manual chair Comments:  []$  change position for pressure      []$  elief/cannot weight shift   []$  change position against      gravitational force on head and      shoulders   []$  decrease pain  []$  blood pressure management   []$  control autonomic dysreflexia  []$  decrease respiratory distress  []$  management of spasticity  []$  management of low tone  []$  facilitate postural control   []$  rest periods   []$  control edema  []$  increase sitting tolerance   []$  aid with transfers     Recline   []$  Power recline on power chair  []$  Manual recline on manual chair  Comments:    []$  intermittent catheterization  []$   manage spasticity  []$  accommodate femur to back angle  []$  change position for pressure relief/cannot weight shift rhigh risk of pressure sore development  []$  tilt alone does not accomplish     effective pressure relief, maximum pressure relief achieved at -      _______ degrees tilt   _______ degrees recline   []$  difficult to transfer to and from bed []$  rest periods and sleeping in chair  []$  repositioning for transfers  []$  bring to full recline for ADL care  []$  clothing/diaper changes in chair  []$  gravity PEG tube feeding  []$  head positioning  []$  decrease pain  []$  blood pressure management   []$  control autonomic dysreflexia  []$  decrease respiratory distress  []$  user on ventilator     Elevator on mobility base  []$  Power wheelchair  []$  Scooter  []$  increase Indep in transfers   []$  increase Indep in ADLs    []$  bathroom function and  safety  []$  kitchen/cooking function and safety  []$  shopping  []$  raise height for communication at standing level  []$  raise height for eye contact which reduces cervical neck strain and pain  []$  drive at raised height for safety and navigating crowds  []$  Other:   []$  Vertical position system  (anterior tilt)     (Drive locks-out)    []$  Stand       (Drive enabled)  []$  independent weight bearing  []$  decrease joint contractures  []$  decrease/manage spasticity  []$  decrease/manage spasms  []$  pressure distribution away from   scapula, sacrum, coccyx, and ischial tuberosity  []$  increase digestion and elimination   []$  access to counters and cabinets  []$  increase reach  []$  increase interaction with others at eye level, reduces neck strain  []$  increase performance of       MRADL(s)      Power elevating legrest    []$  Center mount (Single) 85-170 degrees       []$  Standard (Pair) 100-170 degrees  []$  position legs at 90 degrees, not available with std power ELR  []$  center mount tucks into chair to decrease turning radius in home, not available with std power  ELR  []$  provide change in position for LE  []$  elevate legs during recline    []$  maintain placement of feet on      footplate  []$  decrease edema  []$  improve circulation  []$  actuator needed to elevate legrest  []$  actuator needed to articulate legrest preventing knees from flexing  []$  Increase ground clearance over      curbs  []$   STD (pair) independently                     elevate legrest   POWER WHEELCHAIR CONTROLS      Controls/input device  []$  Expandable  [x]$  Non-expandable  []$  Proportional  []$  Right Hand []$  Left Hand  []$  Non-proportional/switches/head-array  []$  Electrical/proximity         []$   Mechanical      Manufacturer:___________________   Type:________________________ [x]$  provides access for controlling wheelchair  [x]$  programming for accurate control  []$  progressive disease/changing condition  []$  required for alternative drive      controls       []$  lacks motor control to operate  proportional drive control  []$  unable to understand proportional controls  []$  limited movement/strength  []$  extraneous movement / tremors / ataxic / spastic       []$  Upgraded electronics controller/harness    []$  Single power (tilt or recline)   []$  Expandable    []$  Non-expandable plus   []$  Multi-power (tilt, recline, power legrest, power seat lift, vertical positioning system, stand)  []$  allows input device to communicate with drive motors  []$  harness provides necessary connections between the controller, input device, and seat functions     []$  needed in order to operate power seat functions through joystick/ input device  []$  required for alternative drive controls     []$  Enhanced display  []$  required to connect all alternative drive controls   []$  required for upgraded joystick      (lite-throw, heavy duty, micro)  []$  Allows user to see in which mode and drive the wheelchair is set; necessary for alternate controls       []$  Upgraded tracking electronics  []$  correct tracking when on uneven  surfaces makes switch driving more efficient and less fatiguing  []$  increase safety when  driving  []$  increase ability to traverse thresholds    []$  Safety / reset / mode switches     Type:    []$  Used to change modes and stop the wheelchair when driving     [x]$  [facility] for joystick / input device/switches  [x]$  swing away for access or transfers   []$  attaches joystick / input device / switches to wheelchair   [x]$  provides for consistent access  []$  midline for optimal placement    []$  Attendant controlled joystick plus     mount  []$  safety  []$  long distance driving  []$  operation of seat functions  []$  compliance with transportation regulations    [x]$  Battery U1 x 2 [x]$  required to power (power assist / scooter/ power wc / other):   []$  Power inverter (24V to 12V)  []$  required for ventilator / respiratory equipment / other:     CHAIR OPTIONS MANUAL & POWER      Armrests   [x]$  adjustable height []$  removable  []$  swing away []$  fixed  [x]$  flip back  []$  reclining  [x]$  full length pads []$  desk []$  tube arms []$  gel pads  [x]$  provide support with elbow at 90    [x]$  remove/flip back/swing away for  transfers  [x]$  provide support and positioning of upper body    [x]$  allow to come closer to table top  []$  remove for access to tables  []$  provide support for w/c tray  []$  change of height/angles for       variable activities   []$  Elbow support / Elbow stop  []$  keep elbow positioned on arm pad  []$  keep arms from falling off arm pad  during tilt and/or recline   Upper Extremity Support  []$  Arm trough  []$   R  []$   L  Style:  []$  swivel mount []$  fixed mount   []$  posterior hand support  []$   tray  []$  full tray  []$  joystick cut out  []$   R  []$   L  Style:  []$  decrease gravitational pull on      shoulders  []$  provide support to increase UE  function  []$  provide hand support in natural    position  []$  position flaccid UE  []$  decrease subluxation    []$  decrease edema       []$  manage spasticity   []$  provide  midline positioning  []$  provide work surface  []$  placement for AAC/ Computer/ EADL       Hangers/ Legrests   []$  ______ degree  []$  Elevating []$  articulating  []$  swing away []$  fixed []$  lift off  []$  heavy duty []$  adjustable knee angle  []$  adjustable calf panel   []$  longer extension tube              []$  provide LE support  []$  maintain placement of feet on      footplate   []$  accommodate lower leg length  []$  accommodate to hamstring       tightness  []$  enable transfers  []$  provide change in position for LE's  []$  elevate legs during recline    []$  decrease edema  []$  durability      Foot support   [x]$  footplate []$  R []$  L []$  flip up           [x]$  Depth adjustable   [x]$  angle adjustable  []$  foot board/one piece    [x]$  provide foot support  [x]$  accommodate to  ankle ROM  [x]$  allow foot to go under wheelchair base  [x]$  enable transfers     []$  Shoe holders  []$  position foot    []$  decrease / manage spasticity  []$  control position of LE  []$  stability    []$  safety     []$  Ankle strap/heel      loops  []$  support foot on foot support  []$  decrease extraneous movement  []$  provide input to heel   []$  protect foot     []$  Amputee adapter []$  R  []$  L     Style:                  Size:  []$  Provide support for stump/residual extremity    []$  Transportation tie-down  []$  to provide crash tested tie-down brackets    []$  Crutch/cane holder    []$  O2 holder    []$  IV hanger   []$  Ventilator tray/mount    []$  stabilize accessory on wheelchair       Component  Justification     []$  Seat cushion      []$  accommodate impaired sensation  []$  decubitus ulcers present or history  []$  unable to shift weight  []$  increase pressure distribution  []$  prevent pelvic extension  []$  custom required "off-the-shelf"    seat cushion will not accommodate deformity  []$  stabilize/promote pelvis alignment  []$  stabilize/promote femur alignment  []$  accommodate obliquity  []$  accommodate multiple deformity  []$  incontinent/accidents   []$  low maintenance     []$  seat mounts                 []$  fixed []$  removable  []$  attach seat platform/cushion to wheelchair frame    []$  Seat wedge    []$  provide increased aggressiveness of seat shape to decrease sliding  down in the seat  []$  accommodate ROM        []$  Cover replacement   []$  protect back or seat cushion  []$  incontinent/accidents    []$  Solid seat / insert    []$  support cushion to prevent      hammocking  []$  allows attachment of cushion to mobility base    []$  Lateral pelvic/thigh/hip     support (Guides)     []$  decrease abduction  []$  accommodate pelvis  []$  position upper legs  []$  accommodate spasticity  []$  removable for transfers     []$  Lateral pelvic/thigh      supports mounts  []$  fixed   []$  swing-away   []$  removable  []$  mounts lateral pelvic/thigh supports     []$  mounts lateral pelvic/thigh supports swing-away or removable for transfers    []$  Medial thigh support ([place])  []$ decrease adduction  []$ accommodate ROM  []$  remove for transfers   []$  alignment      []$  Medial thigh   []$  fixed      support mounts      []$  swing-away   []$  removable  []$  mounts medial thigh supports   []$  Mounts medial supports swing- away or removable for transfers       Component  Justification   []$  Back       []$  provide posterior trunk support []$  facilitate tone  []$  provide lumbar/sacral support []$  accommodate deformity  []$  support trunk in midline   []$  custom required "off-the-shelf" back support will not accommodate deformity   []$  provide lateral trunk support []$  accommodate or decrease tone            []$   Back mounts  []$  fixed  []$  removable  []$  attach back rest/cushion to wheelchair frame   []$  Lateral trunk      supports  []$  R []$  L  []$  decrease lateral trunk leaning  []$  accommodate asymmetry    []$  contour for increased contact  []$  safety    []$  control of tone    []$  Lateral trunk      supports mounts  []$  fixed  []$  swing-away   []$  removable  []$  mounts lateral trunk supports     []$   Mounts lateral trunk supports swing-away or removable for transfers   []$  Anterior chest      strap, vest     []$  decrease forward movement of shoulder  []$  decrease forward movement of trunk  []$  safety/stability  []$  added abdominal support  []$  trunk alignment  []$  assistance with shoulder control   []$  decrease shoulder elevation    []$  Headrest      []$  provide posterior head support  []$  provide posterior neck support  []$  provide lateral head support  []$  provide anterior head support  []$  support during tilt and recline  []$  improve feeding     []$  improve respiration  []$  placement of switches  []$  safety    []$  accommodate ROM   []$  accommodate tone  []$  improve visual orientation   []$  Headrest           []$  fixed []$  removable []$  flip down      Mounting hardware   []$  swing-away laterals/switches  []$  mount headrest   []$  mounts headrest flip down or  removable for transfers  []$  mount headrest swing-away laterals   []$  mount switches     []$  Neck Support    []$  decrease neck rotation  []$  decrease forward neck flexion   Pelvic Positioner    []$  std hip belt          []$  padded hip belt  []$  dual pull hip belt  []$  four point hip belt  []$  stabilize tone  []$  decrease falling out of chair  []$  prevent excessive extension  []$  special pull angle to control      rotation  []$  pad for protection over boney   prominence  []$  promote comfort    []$  Essential needs        bag/pouch   []$  medicines []$  special food rorthotics []$  clothing changes  []$  diapers  []$  catheter/hygiene []$  ostomy supplies   The above equipment has a life- long use expectancy.  Growth and changes in medical and/or functional conditions would be the exceptions.   SUMMARY:  Why mobility device was selected; include why a lower level device is not appropriate:   ASSESSMENT:  Timed up and go test with RW: 132 seconds with min A  CLINICAL IMPRESSION: Patient is a 67 y.o. female who was seen today for physical therapy evaluation and  treatment for wheelchair evaluation. 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 Patient demonstrates significant weakness in L>R LE with MMT and weakness in bil UE grossly with most significant weakness in L grip strength. Patient reports of severe levels of pain in her knees with standing and walking. She is at very high risk for fall based on her Timed up and go test which was performed with rolling walker and min A. She is able to prevent falls with continued use of her old power wheelchair at home and maintain her  independence. Patient's old power wheelchair is worn out and has potential safety concerns. Patient will benefit from replacement group 2 power wheelchair to continue to maintain her functional mobility and independence at home and reduce risk for fall.   OBJECTIVE IMPAIRMENTS Abnormal gait, decreased activity tolerance, decreased balance, decreased endurance, difficulty walking, decreased ROM, decreased strength, impaired flexibility, impaired UE functional use, postural dysfunction, and pain.   ACTIVITY LIMITATIONS carrying, lifting, bending, standing, squatting, stairs, transfers, bed mobility, bathing, toileting, and locomotion level  PARTICIPATION LIMITATIONS: meal prep, cleaning, shopping, and community activity  PERSONAL FACTORS Age, Past/current experiences, Time since onset of injury/illness/exacerbation, and Transportation are also affecting patient's functional outcome.   REHAB POTENTIAL: Good  CLINICAL DECISION MAKING: Stable/uncomplicated  EVALUATION COMPLEXITY: Low                                   GOALS: One time visit. No goals established.    PLAN: PT FREQUENCY: one time visit    Kerrie Pleasure, PT 04/29/2022, 10:04 AM    I concur with the above findings and recommendations of the therapist:  Physician name printed:         Physician's signature:      Date:

## 2022-05-22 ENCOUNTER — Telehealth: Payer: Self-pay | Admitting: Internal Medicine

## 2022-05-22 ENCOUNTER — Other Ambulatory Visit (HOSPITAL_COMMUNITY): Payer: Self-pay

## 2022-05-22 NOTE — Telephone Encounter (Signed)
Pt is sch for a MRI of the Brain and Cervical Spine on 05/25/2022. Pt states she was to have a Medication called in.  Pt states she will have to cancel her Imaging Appts on Monday @ 05/25/2022 if she does not have any medication called in.  Pt is requesting a call back.

## 2022-05-22 NOTE — Telephone Encounter (Signed)
Pt called / informed Lorazepam rx was sent to [facility].

## 2022-05-25 ENCOUNTER — Ambulatory Visit (HOSPITAL_COMMUNITY): Payer: Medicare PPO

## 2022-05-25 ENCOUNTER — Ambulatory Visit: Payer: Medicare PPO

## 2022-05-25 ENCOUNTER — Encounter: Payer: Medicare PPO | Admitting: Internal Medicine

## 2022-05-27 ENCOUNTER — Other Ambulatory Visit (HOSPITAL_COMMUNITY): Payer: Self-pay

## 2022-05-27 MED ORDER — AMOXICILLIN 500 MG PO CAPS
500.0000 mg | ORAL_CAPSULE | Freq: Three times a day (TID) | ORAL | 0 refills | Status: DC
  Filled 2022-05-27: qty 21, 7d supply, fill #0

## 2022-05-27 MED ORDER — CHLORHEXIDINE GLUCONATE 0.12 % MT SOLN
15.0000 mL | OROMUCOSAL | 1 refills | Status: DC
  Filled 2022-05-27: qty 473, 16d supply, fill #0

## 2022-05-27 MED ORDER — IBUPROFEN 600 MG PO TABS
600.0000 mg | ORAL_TABLET | Freq: Four times a day (QID) | ORAL | 1 refills | Status: DC
  Filled 2022-05-27: qty 20, 5d supply, fill #0

## 2022-06-01 ENCOUNTER — Ambulatory Visit (INDEPENDENT_AMBULATORY_CARE_PROVIDER_SITE_OTHER): Payer: Medicare PPO

## 2022-06-01 ENCOUNTER — Other Ambulatory Visit (HOSPITAL_COMMUNITY): Payer: Self-pay

## 2022-06-01 ENCOUNTER — Other Ambulatory Visit: Payer: Self-pay

## 2022-06-01 ENCOUNTER — Ambulatory Visit (INDEPENDENT_AMBULATORY_CARE_PROVIDER_SITE_OTHER): Payer: Medicare PPO | Admitting: Internal Medicine

## 2022-06-01 ENCOUNTER — Encounter: Payer: Self-pay | Admitting: Internal Medicine

## 2022-06-01 VITALS — BP 159/79 | HR 64 | Temp 98.3°F | Ht 63.0 in | Wt 254.2 lb

## 2022-06-01 DIAGNOSIS — G8929 Other chronic pain: Secondary | ICD-10-CM

## 2022-06-01 DIAGNOSIS — E785 Hyperlipidemia, unspecified: Secondary | ICD-10-CM

## 2022-06-01 DIAGNOSIS — I10 Essential (primary) hypertension: Secondary | ICD-10-CM

## 2022-06-01 DIAGNOSIS — Z0000 Encounter for general adult medical examination without abnormal findings: Secondary | ICD-10-CM | POA: Diagnosis not present

## 2022-06-01 DIAGNOSIS — M2550 Pain in unspecified joint: Secondary | ICD-10-CM

## 2022-06-01 DIAGNOSIS — M545 Low back pain, unspecified: Secondary | ICD-10-CM

## 2022-06-01 DIAGNOSIS — R9089 Other abnormal findings on diagnostic imaging of central nervous system: Secondary | ICD-10-CM | POA: Diagnosis not present

## 2022-06-01 DIAGNOSIS — N3946 Mixed incontinence: Secondary | ICD-10-CM | POA: Diagnosis not present

## 2022-06-01 DIAGNOSIS — K08409 Partial loss of teeth, unspecified cause, unspecified class: Secondary | ICD-10-CM

## 2022-06-01 MED ORDER — AMLODIPINE BESYLATE 10 MG PO TABS
10.0000 mg | ORAL_TABLET | Freq: Every day | ORAL | 3 refills | Status: DC
  Filled 2022-06-01: qty 90, 90d supply, fill #0

## 2022-06-01 MED ORDER — GABAPENTIN 300 MG PO CAPS
300.0000 mg | ORAL_CAPSULE | Freq: Every evening | ORAL | 11 refills | Status: DC | PRN
  Filled 2022-06-01: qty 30, 30d supply, fill #0
  Filled 2023-03-29: qty 30, 30d supply, fill #1

## 2022-06-01 MED ORDER — ATORVASTATIN CALCIUM 80 MG PO TABS
80.0000 mg | ORAL_TABLET | Freq: Every day | ORAL | 3 refills | Status: DC
  Filled 2022-06-01: qty 90, 90d supply, fill #0
  Filled 2023-01-05: qty 90, 90d supply, fill #1

## 2022-06-01 MED ORDER — LOSARTAN POTASSIUM 100 MG PO TABS
100.0000 mg | ORAL_TABLET | Freq: Every day | ORAL | 3 refills | Status: DC
  Filled 2022-06-01: qty 90, 90d supply, fill #0
  Filled 2023-01-05: qty 90, 90d supply, fill #1

## 2022-06-01 NOTE — Progress Notes (Signed)
 "  Established Patient Office Visit  Subjective   Patient ID: Nancy [name], female    DOB: 06-12-1955  Age: 67 y.o. MRN: [id number]  Chief Complaint  Patient presents with   Follow-up   Recent mouth surgery   Medication Refill    Nancy [name] returns to clinic today for follow-up of chronic conditions and chronic pain. She had dental extractions of all lower teeth several days ago and is continuing to have pain from this. Please see assessment/plan in problem-based charting for further details of today's visit.     Patient Active Problem List   Diagnosis Date Noted   Impaired mobility 04/17/2022   Cervical radiculopathy 04/17/2022   Alpha thalassaemia minor 04/06/2022   S/P tooth extraction 12/22/2021   Bilateral hand pain 07/10/2021   Rheumatoid factor positive 07/10/2021   Hyperlipidemia 04/28/2021   Mixed stress and urge incontinence 12/16/2020   Asymmetrical sensorineural hearing loss 08/09/2019   Polyarthralgia 12/02/2016   GERD (gastroesophageal reflux disease) 04/13/2014   Allergic rhinitis 01/24/2014   Morbid obesity with body mass index (BMI) of 40.0 to 44.9 in adult [facility]) 09/14/2013   Abnormal CT of brain 11/19/2011   MACULAR DEGENERATION, BILATERAL 05/22/2008   Essential hypertension 03/17/2006   SICCA SYNDROME 03/17/2006   KNEE PAIN, CHRONIC 03/17/2006   Chronic low back pain 03/17/2006   Obstructive sleep apnea 09/07/2003   Hypothyroidism 09/07/1998   Microcytic anemia 09/07/1998     Objective:     BP (!) 159/79 (BP Location: Left Arm, Patient Position: Sitting, Cuff Size: Large)   Pulse 64   Temp 98.3 F (36.8 C) (Oral)   Ht 5' 3 (1.6 m)   Wt 254 lb 3.2 oz (115.3 kg)   SpO2 98% Comment: RA  BMI 45.03 kg/m  BP Readings from Last 3 Encounters:  06/01/22 (!) 159/79  06/01/22 (!) 159/79  04/15/22 (!) 128/58   Wt Readings from Last 3 Encounters:  06/01/22 254 lb 3.2 oz (115.3 kg)  06/01/22 254 lb 3.2 oz (115.3 kg)  04/15/22 255 lb 3.2 oz (115.8  kg)    Physical Exam Vitals reviewed.  Constitutional:      General: She is not in acute distress.    Appearance: Normal appearance.  HENT:     Mouth/Throat:     Comments: Recent mandibular dental extraction sites appear to be healing, no significant erythema, drainage. Neurological:     Mental Status: She is alert.  Psychiatric:        Mood and Affect: Mood normal.        Behavior: Behavior normal.       Assessment & Plan:   Problem List Items Addressed This Visit       Cardiovascular and Mediastinum   Essential hypertension    BP elevated at 159/79 today, remains elevated on repeat. Nancy [name] did take her BP medications this morning and denies missing doses. She is in quite a bit of pain from her recent dental surgery and has been taking ibuprofen  regularly for the past several days for this. We discussed these are likely contributing to higher than desired BP. Last in-office BP 128/58 in February. We will not make any changes to medications today, however I have encouraged her to switch to acetaminophen  when finished with current supply of prescription ibuprofen . Remain adherent to medications.  Plan -Continue losartan  100 mg, chlorthalidone  25, amlodipine  10 mg daily -F/u 3 months for BP check, BMP      Relevant Medications   losartan  (COZAAR )  100 MG tablet   amLODipine  (NORVASC ) 10 MG tablet   atorvastatin  (LIPITOR) 80 MG tablet     Other   Mixed stress and urge incontinence (Chronic)    Nancy [name] reports improvement in her urinary incontinence symptoms. She had been completing pelvic floor exercises prior to her surgery but has stopped since that time. We discussed the option of pharmacologic therapy with Mirabegron if her symptoms worsen, however would want her BP to be in a better place before this as well.  Plan -CTM symptoms, consider Mirabegron if BP well managed and symptoms are worsening despite pelvic floor exercises      Chronic low back pain - Primary     Recent visits for acute left-sided back pain in the setting of chronic lower back pain, c/f acute exacerbation + neuropathic symptoms. Nancy [name] reports her back pain is much improved. She has used gabapentin  prn which significantly helps and has reduced her use of hydrocodone . Given potential neuropathic component of her previous symptoms I am okay with her continuing the gabapentin  if needed and reserving hydrocodone -acetaminophen  for more severe pain.  Plan -Continue gabapentin  300 mg qhs as needed -Continue hydrocodone -acetaminophen  bid prn per pain contract, [employer] today       Relevant Medications   gabapentin  (NEURONTIN ) 300 MG capsule   Other Relevant Orders   Tox[employer] Select,+Antidepr,UR   Abnormal CT of brain    Nancy [name] canceled her MRI last week d/t upcoming dental surgery. We discussed obtaining this when she is ready.      Polyarthralgia    Nancy [name] reports improvement in her polyarthralgia, including in hands and knees. She was following with Dr. Jeannetta in rheumatology for RA vs Sjogren-related inflammatory arthritis and had been prescribed hydroxychloroquine . She reports she has stopped this medication as she was feeling better.       Hyperlipidemia    I had planned to discuss adding ezetimibe at this visit given no significant change in lipids with atorvastatin  80 mg. However, following our discussion today I am not convinced Nancy [name] has been regularly adherent to statin and we discussed regular use with repeat lipids at next visit prior to addition of another medication.  Plan -Continue atorvastatin  80 mg daily -lipids at next visit      Relevant Medications   losartan  (COZAAR ) 100 MG tablet   amLODipine  (NORVASC ) 10 MG tablet   atorvastatin  (LIPITOR) 80 MG tablet   S/P tooth extraction    Previous concern for dental infection, now s/p lower teeth extraction last week. She is completing a course of amoxicillin  and ibuprofen  prescribed by dentistry and  has f/u. Extraction sites appear to be healing. Encouraged to switch to acetaminophen  prn for pain to avoid worsening BP. She may take prescribed hydrocodone -acetaminophen  for this pain in the acute phase as well.       Return in about 3 months (around 09/01/2022) for fu BP.    [name], MD  "

## 2022-06-01 NOTE — Progress Notes (Signed)
Subjective:   Nancy [name] is a 67 y.o. female who presents for an Initial Medicare Annual Wellness Visit. I connected with  Lalla Brothers on 06/01/22 by a  Face-To-Face encounter   and verified that I am speaking with the correct person using two identifiers.  Patient Location: Other:  Office/Clinic  Provider Location: Office/Clinic  I discussed the limitations of evaluation and management by telemedicine. The patient expressed understanding and agreed to proceed.  Review of Systems    Defer to PCP       Objective:    Today's Vitals   06/01/22 1420 06/01/22 1421  BP: (!) 151/75 (!) 159/79  Pulse: 69 64  Temp: 98.3 F (36.8 C)   TempSrc: Oral   SpO2: 98%   Weight: 254 lb 3.2 oz (115.3 kg)   Height: 5\' 3"  (1.6 m)   PainSc:  9    Body mass index is 45.03 kg/m.     06/01/2022    2:25 PM 06/01/2022   10:54 AM 04/29/2022   10:04 AM 04/15/2022   10:19 AM 04/06/2022   10:11 AM 12/29/2021   12:05 PM 12/22/2021   11:05 AM  Advanced Directives  Does Patient Have a Medical Advance Directive? No No No No No No No  Would patient like information on creating a medical advance directive? No - Patient declined No - Patient declined No - Patient declined No - Patient declined No - Patient declined No - Patient declined No - Patient declined    Current Medications (verified) Outpatient Encounter Medications as of 06/01/2022  Medication Sig   albuterol (PROVENTIL HFA;VENTOLIN HFA) 108 (90 Base) MCG/ACT inhaler Inhale 1-2 puffs every 6 (six) hours as needed into the lungs for wheezing or shortness of breath.   amLODipine (NORVASC) 10 MG tablet Take 1 tablet (10 mg total) by mouth daily.   amoxicillin (AMOXIL) 500 MG capsule Take 1 capsule (500 mg total) by mouth 3 (three) times daily for 7 days   APPLE CIDER VINEGAR PO Take 1 tablet by mouth daily. Gummy [place]   atorvastatin (LIPITOR) 80 MG tablet Take 1 tablet (80 mg total) by mouth daily.   chlorhexidine (PERIDEX) 0.12 % solution  Gently Swish and spit after every meal with 29mL starting on the 2nd day after surgery Then on 5th day use in syringe for targeted rinsing   chlorthalidone (HYGROTON) 25 MG tablet Take 1 tablet (25 mg total) by mouth daily.   diclofenac Sodium (VOLTAREN) 1 % GEL Apply 4 g topically every 6 (six) hours as needed (arthritis pain).   gabapentin (NEURONTIN) 300 MG capsule Take 1 capsule (300 mg total) by mouth at bedtime as needed.   HYDROcodone-acetaminophen (NORCO/VICODIN) 5-325 MG tablet Take 1 tablet by mouth 2 (two) times daily as needed for severe pain.   ibuprofen (ADVIL) 600 MG tablet Take 1 tablet (600 mg total) by mouth every 6 (six) hours for 5 days   levothyroxine (SYNTHROID) 50 MCG tablet Take 1 tablet (50 mcg total) by mouth daily.   lidocaine (LIDODERM) 5 % Place 1 patch onto the skin every 12 (twelve) hours. Remove & Discard patch within 12 hours or as directed by MD   losartan (COZAAR) 100 MG tablet Take 1 tablet (100 mg total) by mouth daily.   metoprolol succinate (TOPROL-XL) 50 MG 24 hr tablet Take 1 tablet (50 mg total) by mouth daily. Take with or immediately following a meal.   Mouthwashes (BIOTENE DRY MOUTH GENTLE) LIQD Use as directed 15  mLs in the mouth or throat 3 (three) times daily as needed (Swish and spit up to three times daily as needed.).   No facility-administered encounter medications on file as of 06/01/2022.    Allergies (verified) Amlodipine, Coconut fatty acids, and Naproxen sodium   History: Past Medical History:  Diagnosis Date   Acute cholecystitis 01/21/2021   AKI (acute kidney injury) ([place]) 12/04/2018   Back pain    chronic low back pain L4-5 discectomy:11/99. degenerative thoracic spondylotic changes 4/07   Chest pain    neg adenosine myoview.   Hypertension    no LVH EKG-7/05, nl M/C ratio 4/07   Hypothyroidism    09/1998   Lower extremity edema    echo EF 55-65% w/o evidence of Dias dysfx.,    Menorrhagia    Microcytic anemia    09/1998.  Hb-10.5/MCV 76. needs ferritin to determine ACD vs IDA   Morbid obesity ([place])    Obstructive sleep apnea    severe 7/05 RD 161 per hr. /CPAP 18cwp   Plantar fasciitis of left foot 12/16/2020   Pneumonia due to COVID-19 virus 12/04/2018   Polyarthralgia    (knee/back/ankle) w/dx of fibromyalgia? given by [facility] rheum, knee pain chronic 2/2 obesity, fibromyalgia and Sjogren's.   Primary Sjogren's syndrome ([place])    anti Ro+;ANA>1:1280 in homogen pattern, negative ds DNA/RF/anti Smith/RNP/C3-4 comp/la/jo1/Scleroderma/centromere, neg HIV/ACE/Hep B/C, nl CXR 2/05, schirmer salivary glandtest not done, symptom rx:eye drops/prednisone/plaquenil referral to [facility] rheum, Dr. Wynelle Cleveland 3/07. saw [name] but stopped 2/2 cost.   Uterine bleeding    Viral URI with cough 11/03/2017   Past Surgical History:  Procedure Laterality Date   CHOLECYSTECTOMY N/A 01/23/2021   Procedure: LAPAROSCOPIC CHOLECYSTECTOMY;  Surgeon: Georganna Skeans, MD;  Location: [facility];  Service: General;  Laterality: N/A;   EXAMINATION UNDER ANESTHESIA  11/23/2011   Procedure: EXAM UNDER ANESTHESIA;  Surgeon: Osborne Oman, MD;  Location: [facility];  Service: Gynecology;  Laterality: N/A;   HYSTEROSCOPY WITH D & C  11/23/2011   Procedure: DILATATION AND CURETTAGE /HYSTEROSCOPY;  Surgeon: Osborne Oman, MD;  Location: [facility];  Service: Gynecology;  Laterality: N/A;   LUMBAR FUSION  1999   SHOULDER SURGERY  unk   TONSILLECTOMY     TUBAL LIGATION  1977   WISDOM TOOTH EXTRACTION     Family History  Problem Relation Age of Onset   Hypertension Mother    Cancer Mother    Cancer Father    Cancer Sister    Ovarian cancer Other        family history   Breast cancer Other        family history in 1st degree relatives.   Thyroid disease Sister    Colon cancer Neg Hx    Stomach cancer Neg Hx    Social History   Socioeconomic History   Marital status: Married    Spouse name: Not on file   Number of children: Not on  file   Years of education: Not on file   Highest education level: Not on file  Occupational History   Occupation: house wife    Employer: UNEMPLOYED  Tobacco Use   Smoking status: Never    Passive exposure: Current   Smokeless tobacco: Never  Vaping Use   Vaping Use: Never used  Substance and Sexual Activity   Alcohol use: No    Alcohol/week: 0.0 standard drinks of alcohol   Drug use: No   Sexual activity: Not on file  Other Topics  Concern   Not on file  Social History Narrative   Her daughter Varney Biles) often comes with her to visits. She also has a grand daughter and great grand daughter(whose name is Kermit Balo)   Social Determinants of Health   Financial Resource Strain: Low Risk  (06/01/2022)   Overall Financial Resource Strain (CARDIA)    Difficulty of Paying Living Expenses: Not hard at all  Food Insecurity: No Food Insecurity (06/01/2022)   Hunger Vital Sign    Worried About Running Out of Food in the Last Year: Never true    Ran Out of Food in the Last Year: Never true  Transportation Needs: No Transportation Needs (06/01/2022)   PRAPARE - Hydrologist (Medical): No    Lack of Transportation (Non-Medical): No  Physical Activity: Inactive (06/01/2022)   Exercise Vital Sign    Days of Exercise per Week: 0 days    Minutes of Exercise per Session: 0 min  Stress: No Stress Concern Present (06/01/2022)   [place]    Feeling of Stress : Not at all  Social Connections: Unknown (06/01/2022)   Social Connection and Isolation Panel [NHANES]    Frequency of Communication with Friends and Family: More than three times a week    Frequency of Social Gatherings with Friends and Family: More than three times a week    Attends Religious Services: Patient declined    Marine scientist or Organizations: No    Attends Music therapist: Never    Marital Status: Married     Tobacco Counseling Counseling given: Not Answered   Clinical Intake:  Pre-visit preparation completed: Yes  Pain : 0-10 Pain Score: 9  Pain Type: Acute pain Pain Location: Mouth Pain Descriptors / Indicators: Aching Pain Onset: In the past 7 days Pain Frequency: Intermittent Pain Relieving Factors: pain medication  Pain Relieving Factors: pain medication  Nutritional Risks: None Diabetes: No  How often do you need to have someone help you when you read instructions, pamphlets, or other written materials from your doctor or pharmacy?: 1 - Never What is the last grade level you completed in school?: 11th grade  Diabetic?No  Interpreter Needed?: No  Information entered by :: Jaevian Shean,cma   Activities of Daily Living    06/01/2022    2:25 PM 06/01/2022   10:53 AM  In your present state of health, do you have any difficulty performing the following activities:  Hearing? 1 1  Vision? 0 0  Difficulty concentrating or making decisions? 0 0  Walking or climbing stairs? 1 1  Dressing or bathing? 0 0  Doing errands, shopping? 1 1    Patient Care Team: Charise Killian, MD as PCP - General (Internal Medicine)  Indicate any recent Medical Services you may have received from other than [facility] providers in the past year (date may be approximate).     Assessment:   This is a routine wellness examination for [place].  Hearing/Vision screen No results found.  Dietary issues and exercise activities discussed:     Goals Addressed   None   Depression Screen    06/01/2022    2:25 PM 06/01/2022   10:53 AM 04/15/2022   10:18 AM 04/06/2022   10:14 AM 12/29/2021   12:08 PM 12/22/2021   11:04 AM 07/21/2021   10:19 AM  PHQ 2/9 Scores  PHQ - 2 Score 0 0 0 0 0 0 0  Fall Risk    06/01/2022    2:25 PM 06/01/2022   10:53 AM 04/15/2022   10:18 AM 04/06/2022   10:14 AM 12/29/2021   11:17 AM  Fall Risk   Falls in the past year? 1 0 0 0 0  Number falls in past yr: 0 0 0 0 0   Injury with Fall? 0 0 0 0 0  Risk for fall due to : Impaired mobility Impaired mobility No Fall Risks Impaired mobility;Impaired balance/gait No Fall Risks  Follow up Falls evaluation completed;Falls prevention discussed Falls evaluation completed;Falls prevention discussed Falls evaluation completed;Falls prevention discussed Falls evaluation completed;Falls prevention discussed Falls evaluation completed    FALL RISK PREVENTION PERTAINING TO THE HOME:  Any stairs in or around the home? No  If so, are there any without handrails? No  Home free of loose throw rugs in walkways, pet beds, electrical cords, etc? Yes  Adequate lighting in your home to reduce risk of falls? Yes   ASSISTIVE DEVICES UTILIZED TO PREVENT FALLS:  Life alert? No  Use of a cane, walker or w/c? Yes  Grab bars in the bathroom? Yes  Shower chair or bench in shower? Yes  Elevated toilet seat or a handicapped toilet? Yes   TIMED UP AND GO:  Was the test performed? No .  Length of time to ambulate 10 feet: 0 sec.   Gait slow and steady with assistive device  Cognitive Function:        Immunizations Immunization History  Administered Date(s) Administered   Fluad Quad(high Dose 65+) 12/08/2018, 12/16/2020   Moderna Covid-19 Vaccine Bivalent Booster 36yrs & up 12/28/2020   PFIZER(Purple Top)SARS-COV-2 Vaccination 05/18/2019, 06/12/2019, 12/16/2019   PNEUMOCOCCAL CONJUGATE-20 12/16/2020   Pneumococcal Polysaccharide-23 12/08/2018   Td 04/29/2009, 09/13/2009    TDAP status: Due, Education has been provided regarding the importance of this vaccine. Advised may receive this vaccine at local pharmacy or Health Dept. Aware to provide a copy of the vaccination record if obtained from local pharmacy or Health Dept. Verbalized acceptance and understanding.  Flu Vaccine status: Due, Education has been provided regarding the importance of this vaccine. Advised may receive this vaccine at local pharmacy or Health Dept.  Aware to provide a copy of the vaccination record if obtained from local pharmacy or Health Dept. Verbalized acceptance and understanding.  Pneumococcal vaccine status: Up to date  Covid-19 vaccine status: Completed vaccines  Qualifies for Shingles Vaccine? No   Zostavax completed No   Shingrix Completed?: No.    Education has been provided regarding the importance of this vaccine. Patient has been advised to call insurance company to determine out of pocket expense if they have not yet received this vaccine. Advised may also receive vaccine at local pharmacy or Health Dept. Verbalized acceptance and understanding.  Screening Tests Health Maintenance  Topic Date Due   Zoster Vaccines- Shingrix (1 of 2) Never done   DEXA SCAN  Never done   DTaP/Tdap/Td (3 - Tdap) 09/14/2019   INFLUENZA VACCINE  10/07/2021   COVID-19 Vaccine (5 - 2023-24 season) 11/07/2021   Medicare Annual Wellness (AWV)  06/01/2023   MAMMOGRAM  06/14/2023   COLONOSCOPY (Pts 45-16yrs Insurance coverage will need to be confirmed)  12/23/2023   Pneumonia Vaccine 79+ Years old  Completed   Hepatitis C Screening  Completed   HPV VACCINES  Aged Out    Health Maintenance  Health Maintenance Due  Topic Date Due   Zoster Vaccines- Shingrix (1 of 2) Never done  DEXA SCAN  Never done   DTaP/Tdap/Td (3 - Tdap) 09/14/2019   INFLUENZA VACCINE  10/07/2021   COVID-19 Vaccine (5 - 2023-24 season) 11/07/2021    Colorectal cancer screening: Type of screening: Colonoscopy. Completed 12/22/2013. Repeat every 10 years  Mammogram status: Completed 06/13/2021. Repeat every year:2    Lung Cancer Screening: (Low Dose CT Chest recommended if Age 71-80 years, 30 pack-year currently smoking OR have quit w/in 15years.) does not qualify.   Lung Cancer Screening Referral: N/A  Additional Screening:  Hepatitis C Screening: does not qualify; Completed 08/11/2016  Vision Screening: Recommended annual ophthalmology exams for early  detection of glaucoma and other disorders of the eye. Is the patient up to date with their annual eye exam?  Yes  Who is the provider or what is the name of the office in which the patient attends annual eye exams? Dr.Groat If pt is not established with a provider, would they like to be referred to a provider to establish care? No .   Dental Screening: Recommended annual dental exams for proper oral hygiene  Community Resource Referral / Chronic Care Management: CRR required this visit?  No   CCM required this visit?  No      Plan:     I have personally reviewed and noted the following in the patient's chart:   Medical and social history Use of alcohol, tobacco or illicit drugs  Current medications and supplements including opioid prescriptions. Patient is currently taking opioid prescriptions. Information provided to patient regarding non-opioid alternatives. Patient advised to discuss non-opioid treatment plan with their provider. Functional ability and status Nutritional status Physical activity Advanced directives List of other physicians Hospitalizations, surgeries, and ER visits in previous 12 months Vitals Screenings to include cognitive, depression, and falls Referrals and appointments  In addition, I have reviewed and discussed with patient certain preventive protocols, quality metrics, and best practice recommendations. A written personalized care plan for preventive services as well as general preventive health recommendations were provided to patient.     Kerin Perna, [facility]   06/01/2022   Nurse Notes: Face-To-Face Visit  Ms. Steinberg , Thank you for taking time to come for your Medicare Wellness Visit. I appreciate your ongoing commitment to your health goals. Please review the following plan we discussed and let me know if I can assist you in the future.   These are the goals we discussed:  Goals   None     This is a list of the screening recommended for you  and due dates:  Health Maintenance  Topic Date Due   Zoster (Shingles) Vaccine (1 of 2) Never done   DEXA scan (bone density measurement)  Never done   DTaP/Tdap/Td vaccine (3 - Tdap) 09/14/2019   Flu Shot  10/07/2021   COVID-19 Vaccine (5 - 2023-24 season) 11/07/2021   Medicare Annual Wellness Visit  06/01/2023   Mammogram  06/14/2023   Colon Cancer Screening  12/23/2023   Pneumonia Vaccine  Completed   Hepatitis C Screening: USPSTF Recommendation to screen - Ages 18-79 yo.  Completed   HPV Vaccine  Aged Out

## 2022-06-01 NOTE — Patient Instructions (Addendum)
It was wonderful to see you today!  I have sent refills to your pharmacy. We will try to transfer medications from [facility] to [facility].  Please take your cholesterol medication, atorvastatin (Lipitor), every day. We will recheck your levels at your next appointment.  You will be contacted to schedule your bone density test.   Please contact your pharmacy about scheduling the shingles (Shingrix) and updated COVID-19 vaccines.

## 2022-06-02 ENCOUNTER — Encounter: Payer: Self-pay | Admitting: Internal Medicine

## 2022-06-02 NOTE — Assessment & Plan Note (Signed)
Nancy [name] reports improvement in her polyarthralgia, including in hands and knees. She was following with Dr. Benjamine Mola in rheumatology for RA vs Sjogren-related inflammatory arthritis and had been prescribed hydroxychloroquine. She reports she has stopped this medication as she was feeling better.

## 2022-06-02 NOTE — Assessment & Plan Note (Signed)
Recent visits for acute left-sided back pain in the setting of chronic lower back pain, c/f acute exacerbation + neuropathic symptoms. Nancy [name] reports her back pain is much improved. She has used gabapentin prn which significantly helps and has reduced her use of hydrocodone. Given potential neuropathic component of her previous symptoms I am okay with her continuing the gabapentin if needed and reserving hydrocodone-acetaminophen for more severe pain.  Plan -Continue gabapentin 300 mg qhs as needed -Continue hydrocodone-acetaminophen bid prn per pain contract, [employer] today

## 2022-06-02 NOTE — Assessment & Plan Note (Signed)
Previous concern for dental infection, now s/p lower teeth extraction last week. She is completing a course of amoxicillin and ibuprofen prescribed by dentistry and has f/u. Extraction sites appear to be healing. Encouraged to switch to acetaminophen prn for pain to avoid worsening BP. She may take prescribed hydrocodone-acetaminophen for this pain in the acute phase as well.

## 2022-06-02 NOTE — Assessment & Plan Note (Signed)
Nancy [name] reports improvement in her urinary incontinence symptoms. She had been completing pelvic floor exercises prior to her surgery but has stopped since that time. We discussed the option of pharmacologic therapy with Mirabegron if her symptoms worsen, however would want her BP to be in a better place before this as well.  Plan -CTM symptoms, consider Mirabegron if BP well managed and symptoms are worsening despite pelvic floor exercises

## 2022-06-02 NOTE — Assessment & Plan Note (Signed)
Nancy [name] canceled her MRI last week d/t upcoming dental surgery. We discussed obtaining this when she is ready.

## 2022-06-02 NOTE — Assessment & Plan Note (Signed)
BP elevated at 159/79 today, remains elevated on repeat. Ms. Trest did take her BP medications this morning and denies missing doses. She is in quite a bit of pain from her recent dental surgery and has been taking ibuprofen regularly for the past several days for this. We discussed these are likely contributing to higher than desired BP. Last in-office BP 128/58 in February. We will not make any changes to medications today, however I have encouraged her to switch to acetaminophen when finished with current supply of prescription ibuprofen. Remain adherent to medications.  Plan -Continue losartan 100 mg, chlorthalidone 25, amlodipine 10 mg daily -F/u 3 months for BP check, BMP

## 2022-06-02 NOTE — Assessment & Plan Note (Signed)
I had planned to discuss adding ezetimibe at this visit given no significant change in lipids with atorvastatin 80 mg. However, following our discussion today I am not convinced Nancy [name] has been regularly adherent to statin and we discussed regular use with repeat lipids at next visit prior to addition of another medication.  Plan -Continue atorvastatin 80 mg daily -lipids at next visit

## 2022-06-05 LAB — TOXASSURE SELECT,+ANTIDEPR,UR

## 2022-06-09 ENCOUNTER — Other Ambulatory Visit: Payer: Self-pay | Admitting: Internal Medicine

## 2022-06-09 DIAGNOSIS — Z1231 Encounter for screening mammogram for malignant neoplasm of breast: Secondary | ICD-10-CM

## 2022-06-23 ENCOUNTER — Other Ambulatory Visit: Payer: Self-pay | Admitting: Pharmacist

## 2022-06-23 NOTE — Progress Notes (Signed)
Patient outreached by Michiel Cowboy, PharmD Candidate on 06/23/2022 to discuss hypertension.   Patient has an automated home blood pressure machine. They report home readings ~110s/70s-80s mmHg.   Medication review was performed. They are taking medications as prescribed.   The following barriers to adherence were noted:  - They do not have cost concerns.  - They do not have transportation concerns.  - They do not need assistance obtaining refills.  - They do not occasionally forget to take some of their prescribed medications.  - They do not feel like one/some of their medications make them feel poorly.  - They do not have questions or concerns about their medications.  - They do have follow up scheduled with their primary care provider/cardiologist.   The following interventions were completed:  - Medications were reviewed  - Patient was educated on goal blood pressures and long term health implications of elevated blood pressure  - Patient was counseled on lifestyle modifications to improve blood pressure, including dietary improvements (limiting intake of salt, caffeine, processed foods) and increased physical activity.   The patient has follow up scheduled:  PCP: 08/24/2022    Michiel Cowboy, PharmD Candidate   Catie Eppie Gibson, PharmD, BCACP, CPP [facility] [phone]

## 2022-06-29 ENCOUNTER — Telehealth: Payer: Self-pay

## 2022-06-29 NOTE — Telephone Encounter (Signed)
Patient called regarding her wheelchair patient stated the lady from [facility] told her they didn't receive any paperwork for her regarding her wheelchair, per Nancy [name] her understanding was that Dr.Lau has already taken care of paperwork and additional information during her last visit please return patients call.

## 2022-07-01 ENCOUNTER — Ambulatory Visit (INDEPENDENT_AMBULATORY_CARE_PROVIDER_SITE_OTHER): Payer: Medicare PPO | Admitting: Internal Medicine

## 2022-07-01 DIAGNOSIS — Z7409 Other reduced mobility: Secondary | ICD-10-CM

## 2022-07-01 NOTE — Progress Notes (Signed)
  [facility] Telephone Encounter Continuity Care Appointment  HPI:  This telephone encounter was created for Nancy [name] on 07/01/2022 for the following purpose/cc mobility assessment for power wheelchair. Please see assessment/plan in problem-based charting for further details of today's visit.       Past Medical History:  Past Medical History:  Diagnosis Date   Acute cholecystitis 01/21/2021   AKI (acute kidney injury) (HCC) 12/04/2018   Back pain    chronic low back pain L4-5 discectomy:11/99. degenerative thoracic spondylotic changes 4/07   Chest pain    neg adenosine myoview.   Hypertension    no LVH EKG-7/05, nl M/C ratio 4/07   Hypothyroidism    09/1998   Lower extremity edema    echo EF 55-65% w/o evidence of Dias dysfx.,    Menorrhagia    Microcytic anemia    09/1998. Hb-10.5/MCV 76. needs ferritin to determine ACD vs IDA   Morbid obesity (HCC)    Obstructive sleep apnea    severe 7/05 RD 161 per hr. /CPAP 18cwp   Plantar fasciitis of left foot 12/16/2020   Pneumonia due to COVID-19 virus 12/04/2018   Polyarthralgia    (knee/back/ankle) w/dx of fibromyalgia? given by [facility] rheum, knee pain chronic 2/2 obesity, fibromyalgia and Sjogren's.   Primary Sjogren's syndrome (HCC)    anti Ro+;ANA>1:1280 in homogen pattern, negative ds DNA/RF/anti Smith/RNP/C3-4 comp/la/jo1/Scleroderma/centromere, neg HIV/ACE/Hep B/C, nl CXR 2/05, schirmer salivary glandtest not done, symptom rx:eye drops/prednisone/plaquenil referral to [facility] rheum, [name] 3/07. saw [name] but stopped 2/2 cost.   Subacute cough 08/15/2009   Qualifier: Diagnosis of   By: Aleene Davidson MD, Elna Breslow       Uterine bleeding    Viral URI with cough 11/03/2017     Assessment / Plan / Recommendations:  Please see A&P under problem oriented charting for assessment of the patient's acute and chronic medical conditions.  As always, pt is advised that if symptoms worsen  or new symptoms arise, they should go to an urgent care facility or to to ER for further evaluation.  Nancy [name] visit today is scheduled for mobility assessment for power wheelchair. I have read the PT evaluation from 04/29/22 and concur with the PT evaluation. The patient has the mental capacity to operate a power wheelchair safely and is willing to use in the home for assistance with IADLs/ADLs such as cooking, cleaning, and toileting.  Patient's height and weight at last visit 06/01/22 were 5'3" and 254 lbs, respectively.   Consent and Medical Decision Making:  This is a telephone encounter between [name] and Dickie La on 07/01/2022 for mobility assessment for power wheelchair. The visit was conducted with the patient located at home and Dickie La at [facility]. The patient's identity was confirmed using their DOB and current address. The patient has consented to being evaluated through a telephone encounter and understands the associated risks (an examination cannot be done and the patient may need to come in for an appointment) / benefits (allows the patient to remain at home, decreasing exposure to coronavirus). I personally spent 4 minutes on medical discussion.

## 2022-07-01 NOTE — Assessment & Plan Note (Signed)
Scheduled for mobility assessment for power wheelchair. I have read the PT evaluation from 04/29/22 and concur with the PT evaluation. The patient has the mental capacity to operate a power wheelchair safely and is willing to use in the home for assistance with IADLs/ADLs such as cooking, cleaning, and toileting.  Patient's height and weight at last visit 06/01/22 were 5'3" and 254 lbs, respectively.

## 2022-08-17 ENCOUNTER — Other Ambulatory Visit (HOSPITAL_COMMUNITY): Payer: Self-pay

## 2022-08-17 ENCOUNTER — Other Ambulatory Visit: Payer: Self-pay | Admitting: Internal Medicine

## 2022-08-17 DIAGNOSIS — E039 Hypothyroidism, unspecified: Secondary | ICD-10-CM

## 2022-08-17 DIAGNOSIS — M25562 Pain in left knee: Secondary | ICD-10-CM

## 2022-08-17 DIAGNOSIS — I10 Essential (primary) hypertension: Secondary | ICD-10-CM

## 2022-08-17 MED ORDER — LEVOTHYROXINE SODIUM 50 MCG PO TABS
50.0000 ug | ORAL_TABLET | Freq: Every day | ORAL | 3 refills | Status: DC
  Filled 2022-08-17: qty 90, 90d supply, fill #0
  Filled 2023-01-05: qty 90, 90d supply, fill #1

## 2022-08-17 MED ORDER — HYDROCODONE-ACETAMINOPHEN 5-325 MG PO TABS
1.0000 | ORAL_TABLET | Freq: Two times a day (BID) | ORAL | 0 refills | Status: DC | PRN
  Filled 2022-08-17: qty 30, 15d supply, fill #0

## 2022-08-17 MED ORDER — METOPROLOL SUCCINATE ER 50 MG PO TB24
50.0000 mg | ORAL_TABLET | Freq: Every day | ORAL | 3 refills | Status: DC
  Filled 2022-08-17: qty 90, 90d supply, fill #0
  Filled 2023-01-05: qty 90, 90d supply, fill #1

## 2022-08-17 NOTE — Telephone Encounter (Signed)
Re: norco refill request Last office visit: 07/01/2022 Last UDS: 06/01/2022  Last Refill: (per [facility] Out[facility] pharmacy) Jan 2024 #30 written by Dr Sol Blazing Next appt: 08/24/2022

## 2022-08-19 ENCOUNTER — Other Ambulatory Visit (HOSPITAL_COMMUNITY): Payer: Self-pay

## 2022-08-24 ENCOUNTER — Ambulatory Visit (INDEPENDENT_AMBULATORY_CARE_PROVIDER_SITE_OTHER): Payer: Medicare PPO | Admitting: Internal Medicine

## 2022-08-24 ENCOUNTER — Other Ambulatory Visit (HOSPITAL_COMMUNITY): Payer: Self-pay

## 2022-08-24 ENCOUNTER — Other Ambulatory Visit: Payer: Self-pay

## 2022-08-24 ENCOUNTER — Encounter: Payer: Self-pay | Admitting: Internal Medicine

## 2022-08-24 VITALS — BP 166/75 | HR 66 | Temp 97.8°F | Ht 63.0 in | Wt 263.5 lb

## 2022-08-24 DIAGNOSIS — N3946 Mixed incontinence: Secondary | ICD-10-CM | POA: Diagnosis not present

## 2022-08-24 DIAGNOSIS — Z6841 Body Mass Index (BMI) 40.0 and over, adult: Secondary | ICD-10-CM

## 2022-08-24 DIAGNOSIS — I700 Atherosclerosis of aorta: Secondary | ICD-10-CM | POA: Insufficient documentation

## 2022-08-24 DIAGNOSIS — G8929 Other chronic pain: Secondary | ICD-10-CM | POA: Diagnosis not present

## 2022-08-24 DIAGNOSIS — M25562 Pain in left knee: Secondary | ICD-10-CM | POA: Diagnosis not present

## 2022-08-24 DIAGNOSIS — G4733 Obstructive sleep apnea (adult) (pediatric): Secondary | ICD-10-CM | POA: Diagnosis not present

## 2022-08-24 DIAGNOSIS — M545 Low back pain, unspecified: Secondary | ICD-10-CM | POA: Diagnosis not present

## 2022-08-24 DIAGNOSIS — E785 Hyperlipidemia, unspecified: Secondary | ICD-10-CM

## 2022-08-24 DIAGNOSIS — I10 Essential (primary) hypertension: Secondary | ICD-10-CM

## 2022-08-24 DIAGNOSIS — Z0000 Encounter for general adult medical examination without abnormal findings: Secondary | ICD-10-CM

## 2022-08-24 DIAGNOSIS — Z7409 Other reduced mobility: Secondary | ICD-10-CM | POA: Diagnosis not present

## 2022-08-24 MED ORDER — DICLOFENAC SODIUM 1 % EX GEL
4.0000 g | Freq: Four times a day (QID) | CUTANEOUS | 0 refills | Status: DC | PRN
  Filled 2022-08-24: qty 100, 7d supply, fill #0

## 2022-08-24 NOTE — Progress Notes (Addendum)
Established Patient Office Visit  Subjective   Patient ID: Nancy [name], female    DOB: 06-26-1955  Age: 67 y.o. MRN: [id number]  Chief Complaint  Patient presents with   Follow-up    Left knee pain    Nancy [name] returns to clinic today for f/u of chronic medical conditions and to discuss weight loss. Please see assessment/plan in problem-based charting for further details of today's visit.     Patient Active Problem List   Diagnosis Date Noted   Aortic atherosclerosis (HCC) 08/24/2022   Impaired mobility 04/17/2022   Cervical radiculopathy 04/17/2022   Alpha thalassaemia minor 04/06/2022   Bilateral hand pain 07/10/2021   Rheumatoid factor positive 07/10/2021   Hyperlipidemia 04/28/2021   Mixed stress and urge incontinence 12/16/2020   Asymmetrical sensorineural hearing loss 08/09/2019   Polyarthralgia 12/02/2016   Healthcare maintenance 12/18/2014   GERD (gastroesophageal reflux disease) 04/13/2014   Allergic rhinitis 01/24/2014   Morbid obesity with BMI of 45.0-49.9, adult (HCC) 09/14/2013   Abnormal CT of brain 11/19/2011   MACULAR DEGENERATION, BILATERAL 05/22/2008   Essential hypertension 03/17/2006   SICCA SYNDROME 03/17/2006   KNEE PAIN, CHRONIC 03/17/2006   Chronic low back pain 03/17/2006   Obstructive sleep apnea 09/07/2003   Hypothyroidism 09/07/1998   Microcytic anemia 09/07/1998      Objective:     BP (!) 166/75 (BP Location: Left Arm, Patient Position: Sitting, Cuff Size: Large)   Pulse 66   Temp 97.8 F (36.6 C) (Oral)   Ht 5\' 3"  (1.6 m)   Wt 263 lb 8 oz (119.5 kg)   SpO2 98% Comment: RA  BMI 46.68 kg/m  BP Readings from Last 3 Encounters:  08/24/22 (!) 166/75  06/01/22 (!) 159/79  06/01/22 (!) 159/79   Wt Readings from Last 3 Encounters:  08/24/22 263 lb 8 oz (119.5 kg)  06/01/22 254 lb 3.2 oz (115.3 kg)  06/01/22 254 lb 3.2 oz (115.3 kg)      Physical Exam Vitals reviewed.  Constitutional:      General: She is not in acute  distress.    Appearance: Normal appearance. She is obese.  Pulmonary:     Effort: Pulmonary effort is normal.  Neurological:     General: No focal deficit present.     Mental Status: She is alert.  Psychiatric:        Mood and Affect: Mood normal.        Behavior: Behavior normal.      Assessment & Plan:   Problem List Items Addressed This Visit       Cardiovascular and Mediastinum   Essential hypertension    BP elevated today. Nancy [name] is experiencing more left knee pain today due to the weather. She reports her home BP readings have been systolic 130s although she does not check regularly. She did take her medication this morning. We had an extensive discussion of the need for good BP control. She is often in pain in the clinic which makes our evaluation difficult. She does not regularly measure at home. We have decided to measure BP daily for one week with good record-keeping. She will return in one week for BP check, to reschedule if she is experiencing significant pain.  Plan -Continue losartan 100 mg, chlorthalidone 25 mg, amlodipine 10 mg daily, metoprolol 50 mg daily -Return in one week for RN BP check, BP log review      Aortic atherosclerosis (HCC) - Primary     Respiratory  Obstructive sleep apnea    Referred back to sleep medicine 04/2022 for CPAP titration. Referral was authorized, patient was to call back to schedule. Will f/u at next visit.        Other   Mixed stress and urge incontinence (Chronic)    Noted some improvement when performing pelvic floor exercises, not working so well now that she has stopped. We discussed resuming exercises. Will hold on mirabegron given uncontrolled BP today.      Impaired mobility (Chronic)    Nancy [name] still has not received her power wheelchair. Similarly to my last visit with her for this problem 06/2022,  I have read the PT evaluation from 04/29/22 and concur with the PT evaluation. The patient has the mental capacity  to operate a power wheelchair safely and is willing to use in the home for assistance with IADLs/ADLs such as cooking, cleaning, and toileting.   Today, patient's height and weight were 5'3" and 263lbs, respectively.  We spoke with the [employer] representative over the telephone during today's visit who notes she has yet to receive DPD, Standard Written Order, and PT notes.  Plan -Message sent to [place] for f/u       KNEE PAIN, CHRONIC    Chronic left knee pain, intermittently worse with weather changes such as today. Sparingly using hydrocodone-acetaminophen. She would like a refill of Voltaren gel for more regular use. ToxAssure 05/2022 appropriate.      Chronic low back pain   Relevant Medications   diclofenac Sodium (VOLTAREN) 1 % GEL   Morbid obesity with BMI of 45.0-49.9, adult [facility])    Nancy [name] continues to have weight gain despite lifestyle counseling. She is interested in pharmacologic options for weight loss today. We discussed options including Orlistat and GLP-1 RAs. Avoiding phentermine, phentermine-topiramate, and bupropion-naltrexone at this time given uncontrolled BP. She is most interested in injectable GLP-1 Ras, however we have discussed the cost of these medications. Given her history of aortic atherosclerosis and several additional risk factors for cardiovascular disease, I do think this medication would be appropriate not only for weight loss but for prevention of heart attacks and strokes. I have asked the Rx assistance team to help with cost estimates prior to prescribing. She continues to not be interested in bariatric surgery other than a last resort.  Plan -F/u with Rx assistance team re: GLP-1 coverage      Healthcare maintenance    Discussed need for additional Pap smear to complete screening. Last done in 2013. Nancy [name] was agreeable to complete at next visit. Received shingles vaccine x2 at [facility], however only one month apart. She may need an additional  dose to complete the series. Recommended to obtain Tdap at pharmacy.      Hyperlipidemia    Adherent to daily statin since last visit, will f/u lipid panel today. Consider adding ezetimibe pending results. ASCVD risk ~17%.  Plan -Lipids       Relevant Orders   Lipid Profile    Return in about 3 months (around 11/24/2022) for fu with Pap smear.    Dickie La, MD

## 2022-08-24 NOTE — Assessment & Plan Note (Addendum)
Nancy [name] continues to have weight gain despite lifestyle counseling. She is interested in pharmacologic options for weight loss today. We discussed options including Orlistat and GLP-1 RAs. Avoiding phentermine, phentermine-topiramate, and bupropion-naltrexone at this time given uncontrolled BP. She is most interested in injectable GLP-1 Ras, however we have discussed the cost of these medications. Given her history of aortic atherosclerosis and several additional risk factors for cardiovascular disease, I do think this medication would be appropriate not only for weight loss but for prevention of heart attacks and strokes. I have asked the Rx assistance team to help with cost estimates prior to prescribing. She continues to not be interested in bariatric surgery other than a last resort.  Plan -F/u with Rx assistance team re: GLP-1 coverage  Addendum No coverage for GLP-1 RA for weight loss. No current comorbidities covered. Discussed starting orlistat to which Nancy [name] is amenable. Reviewed side effects.

## 2022-08-24 NOTE — Assessment & Plan Note (Signed)
Adherent to daily statin since last visit, will f/u lipid panel today. Consider adding ezetimibe pending results. ASCVD risk ~17%.  Plan -Lipids  Addendum Improvement in LDL <100 with daily atorvastatin. ASCVD risk ~15%. Continue statin. Discussed via telephone 08/26/22.

## 2022-08-24 NOTE — Assessment & Plan Note (Signed)
Referred back to sleep medicine 04/2022 for CPAP titration. Referral was authorized, patient was to call back to schedule. Will f/u at next visit.

## 2022-08-24 NOTE — Assessment & Plan Note (Signed)
Chronic left knee pain, intermittently worse with weather changes such as today. Sparingly using hydrocodone-acetaminophen. She would like a refill of Voltaren gel for more regular use. ToxAssure 05/2022 appropriate.

## 2022-08-24 NOTE — Assessment & Plan Note (Signed)
Nancy [name] still has not received her power wheelchair. Similarly to my last visit with her for this problem 06/2022,  I have read the PT evaluation from 04/29/22 and concur with the PT evaluation. The patient has the mental capacity to operate a power wheelchair safely and is willing to use in the home for assistance with IADLs/ADLs such as cooking, cleaning, and toileting.   Today, patient's height and weight were 5'3" and 263lbs, respectively.  We spoke with the [employer] representative over the telephone during today's visit who notes she has yet to receive DPD, Standard Written Order, and PT notes.  Plan -Message sent to [place] for f/u

## 2022-08-24 NOTE — Assessment & Plan Note (Addendum)
Discussed need for additional Pap smear to complete screening. Last done in 2013. Ms. Ortt was agreeable to complete at next visit. Received shingles vaccine x2 at [facility], however only one month apart. She may need an additional dose to complete the series. Recommended to obtain Tdap at pharmacy.

## 2022-08-24 NOTE — Assessment & Plan Note (Signed)
Noted some improvement when performing pelvic floor exercises, not working so well now that she has stopped. We discussed resuming exercises. Will hold on mirabegron given uncontrolled BP today.

## 2022-08-24 NOTE — Patient Instructions (Addendum)
It was wonderful to see you today!  Measure your blood pressure every day for the next week and write it down. Bring these numbers to your blood pressure check visit next week.  We will plan to update your Pap smear at the next visit.  Continue your Kegel exercises to help with urinary leakage.

## 2022-08-24 NOTE — Assessment & Plan Note (Addendum)
BP elevated today. Ms. Cottier is experiencing more left knee pain today due to the weather. She reports her home BP readings have been systolic 130s although she does not check regularly. She did take her medication this morning. We had an extensive discussion of the need for good BP control. She is often in pain in the clinic which makes our evaluation difficult. She does not regularly measure at home. We have decided to measure BP daily for one week with good record-keeping. She will return in one week for BP check, to reschedule if she is experiencing significant pain.  Plan -Continue losartan 100 mg, chlorthalidone 25 mg, amlodipine 10 mg daily, metoprolol 50 mg daily -Return in one week for RN BP check, BP log review

## 2022-08-26 ENCOUNTER — Other Ambulatory Visit (HOSPITAL_COMMUNITY): Payer: Self-pay

## 2022-08-26 LAB — LIPID PANEL
Chol/HDL Ratio: 2.8 ratio (ref 0.0–4.4)
Cholesterol, Total: 154 mg/dL (ref 100–199)
HDL: 56 mg/dL
LDL Chol Calc (NIH): 78 mg/dL (ref 0–99)
Triglycerides: 113 mg/dL (ref 0–149)
VLDL Cholesterol Cal: 20 mg/dL (ref 5–40)

## 2022-08-26 MED ORDER — ORLISTAT 60 MG PO CAPS
60.0000 mg | ORAL_CAPSULE | Freq: Three times a day (TID) | ORAL | 0 refills | Status: DC
  Filled 2022-08-26: qty 120, 40d supply, fill #0
  Filled 2022-08-26: qty 90, 30d supply, fill #0

## 2022-08-26 NOTE — Assessment & Plan Note (Signed)
Noted on prior imaging. Remains on statin therapy.

## 2022-08-26 NOTE — Addendum Note (Signed)
Addended by: Dickie La on: 08/26/2022 11:05 AM   Modules accepted: Orders

## 2022-08-27 ENCOUNTER — Other Ambulatory Visit (HOSPITAL_COMMUNITY): Payer: Self-pay

## 2022-08-28 ENCOUNTER — Other Ambulatory Visit (HOSPITAL_COMMUNITY): Payer: Self-pay

## 2022-09-01 ENCOUNTER — Other Ambulatory Visit (HOSPITAL_COMMUNITY): Payer: Self-pay

## 2022-09-01 ENCOUNTER — Ambulatory Visit: Payer: Medicare PPO | Admitting: *Deleted

## 2022-09-01 ENCOUNTER — Ambulatory Visit
Admission: RE | Admit: 2022-09-01 | Discharge: 2022-09-01 | Disposition: A | Discharge status: Home / Self Care | Payer: Medicare PPO | Source: Ambulatory Visit | Attending: Internal Medicine | Admitting: Internal Medicine

## 2022-09-01 DIAGNOSIS — N958 Other specified menopausal and perimenopausal disorders: Secondary | ICD-10-CM | POA: Diagnosis not present

## 2022-09-01 DIAGNOSIS — Z13820 Encounter for screening for osteoporosis: Secondary | ICD-10-CM

## 2022-09-01 DIAGNOSIS — E349 Endocrine disorder, unspecified: Secondary | ICD-10-CM | POA: Diagnosis not present

## 2022-09-01 NOTE — Progress Notes (Signed)
Reviewed BP results from RN visit. Significantly lower than at prior visits. I spoke with Nancy [name] who reports taking her blood pressure medications every day for the last week rather than every other day as she was previous to last appointment. She denies any symptoms of lightheadedness/dizziness and is otherwise feeling well. She is not having significant MSK pain today. Given very significant improvement in BP taking all prescribed medications, we discussed discontinuing amlodipine 10 mg daily and follow-up in one week for RN BP check. She will continue all other medications daily.

## 2022-09-01 NOTE — Progress Notes (Signed)
    Nancy [name] presented today for blood pressure check. Patient is prescribed blood pressure medications and I confirmed that patient did take their blood pressure medication prior to today's appointment. Blood pressure was taken in the usual and appropriate manner using an automated BP cuff.     Vitals:   09/01/22 1427 09/01/22 1443  BP: (!) 99/52 (!) 102/52   DR Sol Blazing informed pf pt's BP readings.   Results of today's visit will be routed to Dr. Sol Blazing for review and further management.   Harol Shabazz RN

## 2022-09-01 NOTE — Patient Instructions (Signed)
STOP taking amlodipine. Continue your other blood pressure medications daily. Return in one week for RN BP visit.

## 2022-09-04 ENCOUNTER — Telehealth: Payer: Self-pay | Admitting: Internal Medicine

## 2022-09-08 ENCOUNTER — Ambulatory Visit: Payer: Medicare PPO

## 2022-09-14 ENCOUNTER — Ambulatory Visit: Payer: Medicare PPO

## 2022-09-24 DIAGNOSIS — H353131 Nonexudative age-related macular degeneration, bilateral, early dry stage: Secondary | ICD-10-CM | POA: Diagnosis not present

## 2022-09-24 DIAGNOSIS — H04123 Dry eye syndrome of bilateral lacrimal glands: Secondary | ICD-10-CM | POA: Diagnosis not present

## 2022-09-24 DIAGNOSIS — H2513 Age-related nuclear cataract, bilateral: Secondary | ICD-10-CM | POA: Diagnosis not present

## 2022-10-16 DIAGNOSIS — M159 Polyosteoarthritis, unspecified: Secondary | ICD-10-CM | POA: Diagnosis not present

## 2022-10-16 DIAGNOSIS — E661 Drug-induced obesity: Secondary | ICD-10-CM | POA: Diagnosis not present

## 2022-10-16 DIAGNOSIS — M2550 Pain in unspecified joint: Secondary | ICD-10-CM | POA: Diagnosis not present

## 2022-10-21 ENCOUNTER — Ambulatory Visit: Payer: Medicare PPO | Admitting: *Deleted

## 2022-10-21 ENCOUNTER — Other Ambulatory Visit (HOSPITAL_COMMUNITY): Payer: Self-pay

## 2022-10-21 MED ORDER — AMLODIPINE BESYLATE 5 MG PO TABS
5.0000 mg | ORAL_TABLET | Freq: Every day | ORAL | 11 refills | Status: DC
  Filled 2022-10-21 – 2023-01-05 (×2): qty 30, 30d supply, fill #0

## 2022-10-21 NOTE — Progress Notes (Signed)
       Nancy [name] presented today for blood pressure check. Patient is prescribed blood pressure medications and I confirmed that patient did take their blood pressure medication prior to today's appointment. Blood pressure was taken in the usual and appropriate manner using an automated BP cuff.     Vitals:   10/21/22 1009 10/21/22 1021  BP: (!) 147/81 (!) 149/71      Results of today's visit will be routed to Dr. Sol Blazing for review and further management.    Patient would like referral to Orthopaedic doctor for knee injection for left knee pain.

## 2022-10-21 NOTE — Addendum Note (Signed)
Addended by: Dickie La on: 10/21/2022 12:56 PM   Modules accepted: Orders

## 2022-10-21 NOTE — Progress Notes (Signed)
Called Nancy [name] to review. Plan to resume amlodipine at 5 mg daily. F/u in September for recheck and other chronic condition f/u. Message sent to front desk to schedule. [name] may call Dr. Eliberto Ivory office for knee injection as she is already established, she is aware to do so.

## 2022-10-22 ENCOUNTER — Encounter: Payer: Self-pay | Admitting: Internal Medicine

## 2022-11-02 ENCOUNTER — Other Ambulatory Visit (HOSPITAL_COMMUNITY): Payer: Self-pay

## 2022-11-16 DIAGNOSIS — M2550 Pain in unspecified joint: Secondary | ICD-10-CM | POA: Diagnosis not present

## 2022-11-16 DIAGNOSIS — E661 Drug-induced obesity: Secondary | ICD-10-CM | POA: Diagnosis not present

## 2022-11-16 DIAGNOSIS — M159 Polyosteoarthritis, unspecified: Secondary | ICD-10-CM | POA: Diagnosis not present

## 2022-11-24 ENCOUNTER — Encounter: Payer: Medicare PPO | Admitting: Internal Medicine

## 2022-12-16 DIAGNOSIS — M159 Polyosteoarthritis, unspecified: Secondary | ICD-10-CM | POA: Diagnosis not present

## 2022-12-16 DIAGNOSIS — E661 Drug-induced obesity: Secondary | ICD-10-CM | POA: Diagnosis not present

## 2022-12-16 DIAGNOSIS — M2550 Pain in unspecified joint: Secondary | ICD-10-CM | POA: Diagnosis not present

## 2022-12-29 ENCOUNTER — Encounter: Payer: Medicare PPO | Admitting: Internal Medicine

## 2023-01-05 ENCOUNTER — Other Ambulatory Visit: Payer: Self-pay | Admitting: Internal Medicine

## 2023-01-05 DIAGNOSIS — M25562 Pain in left knee: Secondary | ICD-10-CM

## 2023-01-06 ENCOUNTER — Other Ambulatory Visit: Payer: Self-pay

## 2023-01-06 ENCOUNTER — Other Ambulatory Visit (HOSPITAL_COMMUNITY): Payer: Self-pay

## 2023-01-06 MED ORDER — HYDROCODONE-ACETAMINOPHEN 5-325 MG PO TABS
1.0000 | ORAL_TABLET | Freq: Two times a day (BID) | ORAL | 0 refills | Status: DC | PRN
  Filled 2023-01-06: qty 30, 15d supply, fill #0

## 2023-01-16 DIAGNOSIS — M159 Polyosteoarthritis, unspecified: Secondary | ICD-10-CM | POA: Diagnosis not present

## 2023-01-16 DIAGNOSIS — M2550 Pain in unspecified joint: Secondary | ICD-10-CM | POA: Diagnosis not present

## 2023-01-16 DIAGNOSIS — E661 Drug-induced obesity: Secondary | ICD-10-CM | POA: Diagnosis not present

## 2023-02-09 ENCOUNTER — Ambulatory Visit (INDEPENDENT_AMBULATORY_CARE_PROVIDER_SITE_OTHER): Payer: Medicare PPO | Admitting: Internal Medicine

## 2023-02-09 ENCOUNTER — Encounter: Payer: Self-pay | Admitting: Internal Medicine

## 2023-02-09 ENCOUNTER — Other Ambulatory Visit (HOSPITAL_COMMUNITY): Payer: Self-pay

## 2023-02-09 VITALS — BP 160/83 | HR 82 | Temp 97.9°F | Ht 63.0 in | Wt 289.3 lb

## 2023-02-09 DIAGNOSIS — R739 Hyperglycemia, unspecified: Secondary | ICD-10-CM | POA: Diagnosis not present

## 2023-02-09 DIAGNOSIS — I10 Essential (primary) hypertension: Secondary | ICD-10-CM

## 2023-02-09 DIAGNOSIS — G4733 Obstructive sleep apnea (adult) (pediatric): Secondary | ICD-10-CM

## 2023-02-09 DIAGNOSIS — E039 Hypothyroidism, unspecified: Secondary | ICD-10-CM | POA: Diagnosis not present

## 2023-02-09 DIAGNOSIS — Z6841 Body Mass Index (BMI) 40.0 and over, adult: Secondary | ICD-10-CM

## 2023-02-09 DIAGNOSIS — E8770 Fluid overload, unspecified: Secondary | ICD-10-CM | POA: Diagnosis not present

## 2023-02-09 DIAGNOSIS — D509 Iron deficiency anemia, unspecified: Secondary | ICD-10-CM

## 2023-02-09 DIAGNOSIS — M2550 Pain in unspecified joint: Secondary | ICD-10-CM | POA: Diagnosis not present

## 2023-02-09 DIAGNOSIS — I5030 Unspecified diastolic (congestive) heart failure: Secondary | ICD-10-CM | POA: Insufficient documentation

## 2023-02-09 LAB — COMPREHENSIVE METABOLIC PANEL WITH GFR
ALT: 9 U/L (ref 0–44)
AST: 13 U/L — ABNORMAL LOW (ref 15–41)
Albumin: 3.4 g/dL — ABNORMAL LOW (ref 3.5–5.0)
Alkaline Phosphatase: 61 U/L (ref 38–126)
Anion gap: 7 (ref 5–15)
BUN: 12 mg/dL (ref 8–23)
CO2: 30 mmol/L (ref 22–32)
Calcium: 9.3 mg/dL (ref 8.9–10.3)
Chloride: 100 mmol/L (ref 98–111)
Creatinine, Ser: 1.01 mg/dL — ABNORMAL HIGH (ref 0.44–1.00)
GFR, Estimated: >60 mL/min
Glucose, Bld: 106 mg/dL — ABNORMAL HIGH (ref 70–99)
Potassium: 4.2 mmol/L (ref 3.5–5.1)
Sodium: 137 mmol/L (ref 135–145)
Total Bilirubin: 0.6 mg/dL
Total Protein: 7.5 g/dL (ref 6.5–8.1)

## 2023-02-09 LAB — BRAIN NATRIURETIC PEPTIDE: B Natriuretic Peptide: 97.2 pg/mL (ref 0.0–100.0)

## 2023-02-09 MED ORDER — FUROSEMIDE 20 MG PO TABS
20.0000 mg | ORAL_TABLET | Freq: Every day | ORAL | 0 refills | Status: DC
  Filled 2023-02-09: qty 30, 30d supply, fill #0

## 2023-02-09 NOTE — Assessment & Plan Note (Signed)
Patient has not called sleep medicine to schedule CPAP titration. Provided with phone number today.

## 2023-02-09 NOTE — Assessment & Plan Note (Signed)
Continues on levothyroxine 50 mcg daily. Due for annual TSH.  Plan -TSH -Continue levothyroxine 50 mcg daily

## 2023-02-09 NOTE — Assessment & Plan Note (Signed)
Chronic microcytic anemia.Thalassemia testing positive for alpha-thalassemia minor. Given fatigue and SOB, will check CBC for change in blood counts.  Plan -CBC

## 2023-02-09 NOTE — Addendum Note (Signed)
Addended by: Dickie La on: 02/09/2023 02:30 PM   Modules accepted: Orders

## 2023-02-09 NOTE — Progress Notes (Addendum)
Established Patient Office Visit  Subjective   Patient ID: Nancy [name], female    DOB: 1956-01-26  Age: 67 y.o. MRN: [id number]  Chief Complaint  Patient presents with   Foot Swelling    Also SHOB. Generalized aching- all over.   Nancy [name] returns to clinic today for follow-up of chronic medical conditions. Last visit 08/2022. She also notes two weeks of increased leg/foot swelling and shortness of breath. Please see assessment/plan in problem-based charting for further details of today's visit.    Patient Active Problem List   Diagnosis Date Noted   Hypervolemia 02/09/2023   Aortic atherosclerosis (HCC) 08/24/2022   Impaired mobility 04/17/2022   Cervical radiculopathy 04/17/2022   Alpha thalassaemia minor 04/06/2022   Bilateral hand pain 07/10/2021   Rheumatoid factor positive 07/10/2021   Hyperlipidemia 04/28/2021   Mixed stress and urge incontinence 12/16/2020   Asymmetrical sensorineural hearing loss 08/09/2019   Polyarthralgia 12/02/2016   Healthcare maintenance 12/18/2014   GERD (gastroesophageal reflux disease) 04/13/2014   Allergic rhinitis 01/24/2014   Morbid obesity with body mass index (BMI) of 50.0 to 59.9 in adult [facility]) 09/14/2013   Abnormal CT of brain 11/19/2011   MACULAR DEGENERATION, BILATERAL 05/22/2008   Essential hypertension 03/17/2006   SICCA SYNDROME 03/17/2006   KNEE PAIN, CHRONIC 03/17/2006   Chronic low back pain 03/17/2006   Obstructive sleep apnea 09/07/2003   Hypothyroidism 09/07/1998   Microcytic anemia 09/07/1998     Objective:     BP (!) 160/83 (BP Location: Right Arm, Patient Position: Sitting, Cuff Size: Large)   Pulse 82   Temp 97.9 F (36.6 C) (Oral)   Ht 5\' 3"  (1.6 m)   Wt 289 lb 4.8 oz (131.2 kg)   SpO2 97% Comment: RA  BMI 51.25 kg/m  BP Readings from Last 3 Encounters:  02/09/23 (!) 160/83  10/21/22 (!) 149/71  09/01/22 (!) 102/52   Wt Readings from Last 3 Encounters:  02/09/23 289 lb 4.8 oz (131.2 kg)  08/24/22  263 lb 8 oz (119.5 kg)  06/01/22 254 lb 3.2 oz (115.3 kg)    Physical Exam Constitutional:      Appearance: Normal appearance. She is obese.  Cardiovascular:     Rate and Rhythm: Normal rate and regular rhythm.     Heart sounds: Normal heart sounds. No murmur heard. Pulmonary:     Breath sounds: Rales (bilateral lower lung fields) present.     Comments: Increased effort with exertion. Regular effort at rest. Musculoskeletal:     Right lower leg: Edema present.     Left lower leg: Edema present.  Neurological:     Mental Status: She is alert.       Assessment & Plan:   Problem List Items Addressed This Visit       Cardiovascular and Mediastinum   Essential hypertension - Primary    Nancy [name] has had difficult to manage blood pressure, confounded by significant pain during visits and sub-optimal medication adherence in the past. Blood pressure low (102/52) with good adherence to all medication at RN check in June, discontinued amlodipine and pressure mildly elevated (149/71) on recheck a month later. Resumed amlodipine 5 mg at that time.  Blood pressure elevated today, improved but still elevated on recheck. Nancy [name] reports strict adherence to all BP medications. She is having generalized pain today. She has had increased salt intake with the holiday last week, as well as consuming mostly packaged/processed foods. There is concern for volume overload today and  will likely initiate diuretic therapy pending lab results. Increasing amlodipine will likely not be tolerated at this time given LE swelling.  Plan -Labs pending, likely initiate furosemide 20 mg daily based on results -Continue losartan 100 mg, chlorthalidone 25 mg, amlodipine 5 mg, and metoprolol 50 mg daily -F/u in 1 week       Relevant Medications   furosemide (LASIX) 20 MG tablet   Other Relevant Orders   CMP w Anion Gap (STAT/Sunquest-performed on-site) (Completed)     Respiratory   Obstructive sleep apnea     Patient has not called sleep medicine to schedule CPAP titration. Provided with phone number today.        Endocrine   Hypothyroidism    Continues on levothyroxine 50 mcg daily. Due for annual TSH.  Plan -TSH -Continue levothyroxine 50 mcg daily      Relevant Orders   TSH     Other   Microcytic anemia    Chronic microcytic anemia.Thalassemia testing positive for alpha-thalassemia minor. Given fatigue and SOB, will check CBC for change in blood counts.  Plan -CBC      Relevant Orders   CBC no Diff   Morbid obesity with body mass index (BMI) of 50.0 to 59.9 in adult [facility])    Continued weight gain. Concern for fluid rather than adipose tissue today, however weight remains a significant issue and risk for chronic disease. Trial of orlistat at prior visit, [name] has stopped due to stomach discomfort. She has not been able to adhere to lifestyle changes. Will investigate options for financial assistance of GLP1-Ras as this patient would benefit from significant weight loss with assistance of medication. Could circle back to conversation regarding bariatric surgery if there are no medical options.  Plan -F/u with Rx assistance team re: GLP-1 assistance      Polyarthralgia    Continued polyarthralgia, worsened by cold temperatures. Previously established with Dr. Dimple Casey in rheumatology for RA vs Sjogren-related inflammatory arthritis and was prescribed hcq but discontinued on her own. She has not followed with rheum but is interested today. I have provided the office phone number to schedule f/u. She is no longer using Norco, gabapentin daily prn instead which she reports as effective.  Plan -Gabapentin 300 mg at bedtime prn -F/u with rheumatology      Hypervolemia    Nancy [name] presents today with 2 weeks of increased swelling in her bilateral lower extremities, as well as increased dyspnea with exertion and at rest. She has also had a cough during this time period, no  fevers or chest pain.   Weight has significantly increased from last visit, up 26 pounds. Exam with bilateral pitting edema to the knees. Crackles over bilateral lower lung fields, R > L. She is notably SOB with exertion. Oxygen saturation maintained.  Concern for hypervolemia of unclear etiology. Given prolonged hypertension and obesity, certainly concern for new HF. Stat TTE, CMP, and BNP ordered. Will also evaluate for liver abnormalities and proteinuria. If labs are stable today, anticipate starting loop diuretic for decongestion with close f/u and return precautions for worsening symptoms. Ms. Daves is fairly deconditioned at baseline and mostly gets around with a wheelchair.  Plan -Stat TTE, CMP, BNP -Urine PCR -Discussed low salt diet, BP management  Addendum CMP with stable electrolytes and kidney function. BNP borderline, suspect falsely normal with obesity. Will start furosemide 20 mg daily, f/u one week. Encouraged patient to call with worsening symptoms or if no increase in urination. TTE scheduled  12/5.      Relevant Medications   furosemide (LASIX) 20 MG tablet   Other Relevant Orders   Brain natriuretic peptide (Completed)   CMP w Anion Gap (STAT/Sunquest-performed on-site) (Completed)   ECHOCARDIOGRAM COMPLETE   Protein / Creatinine Ratio, Urine   Other Visit Diagnoses     Hyperglycemia       Relevant Orders   Hemoglobin A1c       Return in about 1 week (around 02/16/2023) for fu--okay to overbook me.    Dickie La, MD

## 2023-02-09 NOTE — Assessment & Plan Note (Addendum)
Continued weight gain. Concern for fluid rather than adipose tissue today, however weight remains a significant issue and risk for chronic disease. Trial of orlistat at prior visit, Ms. Maglione has stopped due to stomach discomfort. She has not been able to adhere to lifestyle changes. Will investigate options for financial assistance of GLP1-Ras as this patient would benefit from significant weight loss with assistance of medication. Could circle back to conversation regarding bariatric surgery if there are no medical options.  Plan -F/u with Rx assistance team re: GLP-1 assistance

## 2023-02-09 NOTE — Assessment & Plan Note (Addendum)
[name] presents today with 2 weeks of increased swelling in her bilateral lower extremities, as well as increased dyspnea with exertion and at rest. She has also had a cough during this time period, no fevers or chest pain.   Weight has significantly increased from last visit, up 26 pounds. Exam with bilateral pitting edema to the knees. Crackles over bilateral lower lung fields, R > L. She is notably SOB with exertion. Oxygen saturation maintained.  Concern for hypervolemia of unclear etiology. Given prolonged hypertension and obesity, certainly concern for new HF. Stat TTE, CMP, and BNP ordered. Will also evaluate for liver abnormalities and proteinuria. If labs are stable today, anticipate starting loop diuretic for decongestion with close f/u and return precautions for worsening symptoms. [name] is fairly deconditioned at baseline and mostly gets around with a wheelchair.  Plan -Stat TTE, CMP, BNP -Urine PCR -Discussed low salt diet, BP management  Addendum CMP with stable electrolytes and kidney function. BNP borderline, suspect falsely normal with obesity. Will start furosemide 20 mg daily, f/u one week. Encouraged patient to call with worsening symptoms or if no increase in urination. TTE scheduled 12/5.

## 2023-02-09 NOTE — Patient Instructions (Addendum)
It was wonderful to see you today!  I will call you with lab results today and to discuss a fluid pill. Please watch carefully for salt in your diet as this will cause you to have increased blood pressure and swelling.   Call the rheumatology office at [phone] to schedule a follow-up appointment for arthritis.  Please call the sleep medicine team at [phone] to schedule CPAP titration.  Please contact your pharmacy about scheduling the tetanus (Tdap) vaccine.

## 2023-02-09 NOTE — Assessment & Plan Note (Signed)
Continued polyarthralgia, worsened by cold temperatures. Previously established with Dr. Dimple Casey in rheumatology for RA vs Sjogren-related inflammatory arthritis and was prescribed hcq but discontinued on her own. She has not followed with rheum but is interested today. I have provided the office phone number to schedule f/u. She is no longer using Norco, gabapentin daily prn instead which she reports as effective.  Plan -Gabapentin 300 mg at bedtime prn -F/u with rheumatology

## 2023-02-09 NOTE — Assessment & Plan Note (Addendum)
Nancy [name] has had difficult to manage blood pressure, confounded by significant pain during visits and sub-optimal medication adherence in the past. Blood pressure low (102/52) with good adherence to all medication at RN check in June, discontinued amlodipine and pressure mildly elevated (149/71) on recheck a month later. Resumed amlodipine 5 mg at that time.  Blood pressure elevated today, improved but still elevated on recheck. Nancy [name] reports strict adherence to all BP medications. She is having generalized pain today. She has had increased salt intake with the holiday last week, as well as consuming mostly packaged/processed foods. There is concern for volume overload today and will likely initiate diuretic therapy pending lab results. Increasing amlodipine will likely not be tolerated at this time given LE swelling.  Plan -Labs pending, likely initiate furosemide 20 mg daily based on results -Continue losartan 100 mg, chlorthalidone 25 mg, amlodipine 5 mg, and metoprolol 50 mg daily -F/u in 1 week

## 2023-02-10 LAB — CBC
Hematocrit: 33.5 % — ABNORMAL LOW (ref 34.0–46.6)
Hemoglobin: 10.1 g/dL — ABNORMAL LOW (ref 11.1–15.9)
MCH: 23.5 pg — ABNORMAL LOW (ref 26.6–33.0)
MCHC: 30.1 g/dL — ABNORMAL LOW (ref 31.5–35.7)
MCV: 78 fL — ABNORMAL LOW (ref 79–97)
Platelets: 268 10*3/uL (ref 150–450)
RBC: 4.29 x10E6/uL (ref 3.77–5.28)
RDW: 15 % (ref 11.7–15.4)
WBC: 5.9 10*3/uL (ref 3.4–10.8)

## 2023-02-10 LAB — TSH: TSH: 2.69 u[IU]/mL (ref 0.450–4.500)

## 2023-02-10 LAB — HEMOGLOBIN A1C
Est. average glucose Bld gHb Est-mCnc: 134 mg/dL
Hgb A1c MFr Bld: 6.3 % — ABNORMAL HIGH (ref 4.8–5.6)

## 2023-02-11 ENCOUNTER — Ambulatory Visit (HOSPITAL_COMMUNITY): Payer: Medicare PPO | Attending: Internal Medicine

## 2023-02-11 DIAGNOSIS — R0602 Shortness of breath: Secondary | ICD-10-CM | POA: Diagnosis not present

## 2023-02-11 DIAGNOSIS — E8770 Fluid overload, unspecified: Secondary | ICD-10-CM | POA: Diagnosis present

## 2023-02-11 LAB — ECHOCARDIOGRAM COMPLETE
Area-P 1/2: 3.63 cm2
S' Lateral: 3 cm

## 2023-02-11 LAB — PROTEIN / CREATININE RATIO, URINE
Creatinine, Urine: 71.4 mg/dL
Protein, Ur: 10.1 mg/dL
Protein/Creat Ratio: 141 mg/g{creat} (ref 0–200)

## 2023-02-11 NOTE — Progress Notes (Signed)
Normal EF. Mild LV hypertrophy and LA dilation. Mild to moderate MVR. Continue to suspect HFpEF as cause of patient's recent symptoms. Reviewed TTE results with Nancy [name] 12/5. She has not started Lasix. She will start today, f/u on 12/10.

## 2023-02-15 DIAGNOSIS — M2550 Pain in unspecified joint: Secondary | ICD-10-CM | POA: Diagnosis not present

## 2023-02-15 DIAGNOSIS — E661 Drug-induced obesity: Secondary | ICD-10-CM | POA: Diagnosis not present

## 2023-02-15 DIAGNOSIS — M159 Polyosteoarthritis, unspecified: Secondary | ICD-10-CM | POA: Diagnosis not present

## 2023-02-16 ENCOUNTER — Encounter: Payer: Medicare PPO | Admitting: Internal Medicine

## 2023-02-17 ENCOUNTER — Telehealth: Payer: Self-pay | Admitting: Internal Medicine

## 2023-02-17 NOTE — Telephone Encounter (Signed)
Called Nancy [name] to follow-up on missed appointment 12/10. She reports her pain was increased due to the rain and she was unable to make it to the office. She has been taking Lasix 20 mg daily without significant increase in urination. Mild improvement noted in dyspnea and LEE. I have asked her to increase to 40 mg daily and f/u with me on 12/17 for labs, BP check, symptom check. She agrees. Message sent to front desk for scheduling.

## 2023-02-23 ENCOUNTER — Ambulatory Visit (INDEPENDENT_AMBULATORY_CARE_PROVIDER_SITE_OTHER): Payer: Medicare PPO | Admitting: Internal Medicine

## 2023-02-23 ENCOUNTER — Other Ambulatory Visit (HOSPITAL_COMMUNITY): Payer: Self-pay

## 2023-02-23 DIAGNOSIS — I110 Hypertensive heart disease with heart failure: Secondary | ICD-10-CM | POA: Diagnosis not present

## 2023-02-23 DIAGNOSIS — I5031 Acute diastolic (congestive) heart failure: Secondary | ICD-10-CM | POA: Diagnosis not present

## 2023-02-23 DIAGNOSIS — I10 Essential (primary) hypertension: Secondary | ICD-10-CM | POA: Diagnosis not present

## 2023-02-23 DIAGNOSIS — E8770 Fluid overload, unspecified: Secondary | ICD-10-CM | POA: Diagnosis not present

## 2023-02-23 MED ORDER — FUROSEMIDE 40 MG PO TABS
40.0000 mg | ORAL_TABLET | Freq: Every day | ORAL | 0 refills | Status: DC
  Filled 2023-02-23: qty 30, 30d supply, fill #0

## 2023-02-23 MED ORDER — CHLORTHALIDONE 25 MG PO TABS
25.0000 mg | ORAL_TABLET | Freq: Every day | ORAL | 3 refills | Status: DC
  Filled 2023-02-23: qty 90, 90d supply, fill #0

## 2023-02-23 NOTE — Progress Notes (Addendum)
 Established Patient Office Visit  Subjective   Patient ID: Nancy [name], female    DOB: [birth year]  Age: 67 y.o. MRN: [id number]  Chief Complaint  Patient presents with   Follow-up   Medication Refill    chlorthalidone    Ms. Rohr returns to clinic today for two week follow-up of hypertension, dyspnea, and lower extremity swelling. Please see assessment/plan in problem-based charting for further details of today's visit.     Patient Active Problem List   Diagnosis Date Noted   (HFpEF) heart failure with preserved ejection fraction (HCC) 02/09/2023   Aortic atherosclerosis (HCC) 08/24/2022   Impaired mobility 04/17/2022   Cervical radiculopathy 04/17/2022   Alpha thalassaemia minor 04/06/2022   Bilateral hand pain 07/10/2021   Rheumatoid factor positive 07/10/2021   Hyperlipidemia 04/28/2021   Mixed stress and urge incontinence 12/16/2020   Asymmetrical sensorineural hearing loss 08/09/2019   Polyarthralgia 12/02/2016   Healthcare maintenance 12/18/2014   GERD (gastroesophageal reflux disease) 04/13/2014   Allergic rhinitis 01/24/2014   Morbid obesity with body mass index (BMI) of 50.0 to 59.9 in adult [facility]) 09/14/2013   Abnormal CT of brain 11/19/2011   MACULAR DEGENERATION, BILATERAL 05/22/2008   Essential hypertension 03/17/2006   SICCA SYNDROME 03/17/2006   KNEE PAIN, CHRONIC 03/17/2006   Chronic low back pain 03/17/2006   Obstructive sleep apnea 09/07/2003   Hypothyroidism 09/07/1998   Microcytic anemia 09/07/1998     Objective:     BP (!) 164/72 (BP Location: Left Arm, Patient Position: Sitting, Cuff Size: Large)   Pulse 78   Temp 98.3 F (36.8 C) (Oral)   Ht 5\' 3"  (1.6 m)   Wt 282 lb 12.8 oz (128.3 kg)   SpO2 96%   BMI 50.10 kg/m  BP Readings from Last 3 Encounters:  02/23/23 (!) 164/72  02/09/23 (!) 160/83  10/21/22 (!) 149/71   Wt Readings from Last 3 Encounters:  02/23/23 282 lb 12.8 oz (128.3 kg)  02/09/23 289 lb 4.8 oz (131.2 kg)   08/24/22 263 lb 8 oz (119.5 kg)     Physical Exam Constitutional:      General: She is not in acute distress.    Appearance: Normal appearance.  Cardiovascular:     Rate and Rhythm: Normal rate.  Pulmonary:     Effort: Pulmonary effort is normal. No respiratory distress.     Breath sounds: Normal breath sounds. No rales.  Musculoskeletal:     Right lower leg: Edema (1+ edema) present.     Left lower leg: Edema (1+ edema) present.  Neurological:     Mental Status: She is alert.       Assessment & Plan:   Problem List Items Addressed This Visit       Cardiovascular and Mediastinum   Essential hypertension   Continued elevation in blood pressure today. Unfortunately, Ms. Wedel ran out of chlorthalidone last week and has not been taking. She has been taking furosemide 40 mg daily since last week with improvement in swelling (as outlined elsewhere). Will obtain labs today and likely adjust BP medication. I would like to stop amlodipine and likely add spironolactone, waiting on labs. Will call Ms. Rhames with the plan.  Plan -Continue losartan 100 mg, chlorthalidone 25 mg, amlodipine 5 mg, and metoprolol 50 mg daily -Continue furosemide 40 mg daily for now -BMP, Mg      Relevant Medications   chlorthalidone (HYGROTON) 25 MG tablet   furosemide (LASIX) 40 MG tablet   Other Relevant Orders  BMP8+Anion Gap (Completed)   Magnesium (Completed)   (HFpEF) heart failure with preserved ejection fraction (HCC)   F/u for LEE and dyspnea, concerning for acute HFpEf, from visit 12/3. Started on furosemide 20 mg at that time without significant change in symptoms, increased to 40 mg with increased urinary output and improvement LEE. Breathing feels about the same, although patient appears much more comfortable today and no crackles on exam. Continues to have bilateral edema to the knees. TTE with LV hypertrophy and LA dilation, normal EF. She is down about 7 pounds from last visit.  Continue daily Lasix and labs today. Will discuss increase to bid dosing or other change in BP medication pending labs.  Plan -Continue furosemide 40 mg daily for now, f/u labs -BMP, Mg  Addendum 12/19 Discussed lab results with Ms. Geter. Would like to increase furosemide to bid, return in one week for lab and BP check. Message sent to front desk for scheduling. Would consider additional anti-hypertensive such as spironolactone at that time if needed.      Relevant Medications   chlorthalidone (HYGROTON) 25 MG tablet   furosemide (LASIX) 40 MG tablet   Other Relevant Orders   BMP8+Anion Gap (Completed)   Magnesium (Completed)   BMP8+Anion Gap   Magnesium   Fu pending lab evaluation but ALSO,  Return in about 3 months (around 05/24/2023) for fu BP.    Dickie La, MD

## 2023-02-23 NOTE — Assessment & Plan Note (Addendum)
 F/u for LEE and dyspnea, concerning for acute HFpEf, from visit 12/3. Started on furosemide  20 mg at that time without significant change in symptoms, increased to 40 mg with increased urinary output and improvement LEE. Breathing feels about the same, although patient appears much more comfortable today and no crackles on exam. Continues to have bilateral edema to the knees. TTE with LV hypertrophy and LA dilation, normal EF. She is down about 7 pounds from last visit. Continue daily Lasix  and labs today. Will discuss increase to bid dosing or other change in BP medication pending labs.  Plan -Continue furosemide  40 mg daily for now, f/u labs -BMP, Mg  Addendum 12/19 Discussed lab results with Nancy [name]. Would like to increase furosemide  to bid, return in one week for lab and BP check. Message sent to front desk for scheduling. Would consider additional anti-hypertensive such as spironolactone at that time if needed.

## 2023-02-23 NOTE — Patient Instructions (Signed)
It was wonderful to see you today!  I will call you with lab results Wednesday or Thursday to talk about the plan for medications.  Please contact your pharmacy about scheduling the shingles (Shingrix) and tetanus (Tdap) vaccines.

## 2023-02-23 NOTE — Assessment & Plan Note (Signed)
Continued elevation in blood pressure today. Unfortunately, Nancy [name] ran out of chlorthalidone last week and has not been taking. She has been taking furosemide 40 mg daily since last week with improvement in swelling (as outlined elsewhere). Will obtain labs today and likely adjust BP medication. I would like to stop amlodipine and likely add spironolactone, waiting on labs. Will call Nancy [name] with the plan.  Plan -Continue losartan 100 mg, chlorthalidone 25 mg, amlodipine 5 mg, and metoprolol 50 mg daily -Continue furosemide 40 mg daily for now -BMP, Mg

## 2023-02-24 LAB — BMP8+ANION GAP
Anion Gap: 13 mmol/L (ref 10.0–18.0)
BUN/Creatinine Ratio: 13 (ref 12–28)
BUN: 14 mg/dL (ref 8–27)
CO2: 27 mmol/L (ref 20–29)
Calcium: 9.1 mg/dL (ref 8.7–10.3)
Chloride: 99 mmol/L (ref 96–106)
Creatinine, Ser: 1.05 mg/dL — ABNORMAL HIGH (ref 0.57–1.00)
Glucose: 91 mg/dL (ref 70–99)
Potassium: 4.1 mmol/L (ref 3.5–5.2)
Sodium: 139 mmol/L (ref 134–144)
eGFR: 58 mL/min/{1.73_m2} — ABNORMAL LOW

## 2023-02-24 LAB — MAGNESIUM: Magnesium: 1.9 mg/dL (ref 1.6–2.3)

## 2023-02-25 NOTE — Addendum Note (Signed)
Addended by: Dickie La on: 02/25/2023 11:00 AM   Modules accepted: Orders

## 2023-03-05 ENCOUNTER — Ambulatory Visit (INDEPENDENT_AMBULATORY_CARE_PROVIDER_SITE_OTHER): Payer: Medicare PPO | Admitting: *Deleted

## 2023-03-05 DIAGNOSIS — I5031 Acute diastolic (congestive) heart failure: Secondary | ICD-10-CM | POA: Diagnosis not present

## 2023-03-05 DIAGNOSIS — I10 Essential (primary) hypertension: Secondary | ICD-10-CM | POA: Diagnosis not present

## 2023-03-05 NOTE — Progress Notes (Signed)
Internal Medicine Attending:   I was present in the office suite and immediately available to provide assistance and direction throughout the time the service was provided.

## 2023-03-05 NOTE — Progress Notes (Signed)
    Nancy [name] presented today for blood pressure check. Patient is prescribed blood pressure medications and I confirmed that patient did take their blood pressure medication prior to today's appointment. Pt stated she takes her BP meds in the evening; last taken yesterday afternoon. Blood pressure was taken in the usual and appropriate manner using an automated BP cuff.     Vitals:   03/05/23 1125  BP: 133/73      Results of today's visit will be routed to Dr Sol Blazing  for review and further management.

## 2023-03-06 LAB — BMP8+ANION GAP
Anion Gap: 14 mmol/L (ref 10.0–18.0)
BUN/Creatinine Ratio: 16 (ref 12–28)
BUN: 18 mg/dL (ref 8–27)
CO2: 30 mmol/L — ABNORMAL HIGH (ref 20–29)
Calcium: 9.3 mg/dL (ref 8.7–10.3)
Chloride: 97 mmol/L (ref 96–106)
Creatinine, Ser: 1.13 mg/dL — ABNORMAL HIGH (ref 0.57–1.00)
Glucose: 108 mg/dL — ABNORMAL HIGH (ref 70–99)
Potassium: 3.9 mmol/L (ref 3.5–5.2)
Sodium: 141 mmol/L (ref 134–144)
eGFR: 53 mL/min/{1.73_m2} — ABNORMAL LOW

## 2023-03-06 LAB — MAGNESIUM: Magnesium: 2.2 mg/dL (ref 1.6–2.3)

## 2023-03-08 NOTE — Progress Notes (Signed)
Electrolytes wnl. CO2 mildly elevated, creatinine slightly increased. Nancy [name] reports significant improvement in swelling and better breathing with mild exertion. We discussed return to Lasix one tablet daily, f/u in 2-4 weeks for labs.

## 2023-03-29 ENCOUNTER — Other Ambulatory Visit: Payer: Self-pay

## 2023-03-29 ENCOUNTER — Other Ambulatory Visit (HOSPITAL_COMMUNITY): Payer: Self-pay

## 2023-03-29 ENCOUNTER — Other Ambulatory Visit: Payer: Self-pay | Admitting: Internal Medicine

## 2023-03-29 DIAGNOSIS — M25562 Pain in left knee: Secondary | ICD-10-CM

## 2023-03-30 ENCOUNTER — Other Ambulatory Visit (HOSPITAL_COMMUNITY): Payer: Self-pay

## 2023-03-30 NOTE — Telephone Encounter (Signed)
Medication is not on current med list. Next appt scheduled 06/01/23.

## 2023-03-31 ENCOUNTER — Telehealth: Payer: Self-pay

## 2023-03-31 ENCOUNTER — Other Ambulatory Visit: Payer: Self-pay | Admitting: Internal Medicine

## 2023-03-31 ENCOUNTER — Other Ambulatory Visit (HOSPITAL_COMMUNITY): Payer: Self-pay

## 2023-03-31 DIAGNOSIS — I10 Essential (primary) hypertension: Secondary | ICD-10-CM

## 2023-03-31 MED ORDER — HYDROCODONE-ACETAMINOPHEN 5-325 MG PO TABS
1.0000 | ORAL_TABLET | Freq: Two times a day (BID) | ORAL | 0 refills | Status: DC | PRN
  Filled 2023-03-31 (×2): qty 30, 15d supply, fill #0

## 2023-03-31 NOTE — Telephone Encounter (Signed)
Decision:  Georgeann Oppenheim (Key: [facility]) Rx #: [id number] Need Help? Call us at [phone] Outcome Additional Information Required Prior Authorization not required for patient/medication Drug HYDROcodone-Acetaminophen 5-325MG  tablets ePA cloud logo Form [facility] [employer]

## 2023-03-31 NOTE — Telephone Encounter (Signed)
Discussed with Ms. Fanning. Chronic pain has been worse, not improved significantly by gabapentin as previously noted. She does have a rheumatology appointment on 2/11 which I encouraged her to keep. Prescription for Norco prn sent to pharmacy.

## 2023-03-31 NOTE — Telephone Encounter (Signed)
Prior Authorization for patient (HYDROcodone-Acetaminophen 5-325MG  tablets) came through on cover my meds was submitted awaiting approval or denial.  [id number]

## 2023-04-05 ENCOUNTER — Other Ambulatory Visit (HOSPITAL_COMMUNITY): Payer: Self-pay

## 2023-04-06 ENCOUNTER — Other Ambulatory Visit (INDEPENDENT_AMBULATORY_CARE_PROVIDER_SITE_OTHER): Payer: Self-pay

## 2023-04-06 DIAGNOSIS — I10 Essential (primary) hypertension: Secondary | ICD-10-CM

## 2023-04-06 NOTE — Progress Notes (Deleted)
 Office Visit Note  Patient: Nancy [name]             Date of Birth: [birth year]           MRN: [id number]             PCP: Dickie La, MD Referring: Dickie La, MD Visit Date: 04/20/2023   Subjective:  No chief complaint on file.   History of Present Illness: Nancy [name] is a 68 y.o. female here for follow up for evaluation of joint pain in multiple areas and abnormal labs concerning for inflammatory arthritis currently with ongoing bilateral hand and knee pain issues are the worst.    Previous HPI 01/12/2022 Nancy [name] is a 68 y.o. female here for follow up for evaluation of joint pain in multiple areas and abnormal labs concerning for inflammatory arthritis currently with ongoing bilateral hand and knee pain issues are the worst. Lab results at initial visit did show persistent sedimentation rate elevation and suspicious for inflammatory pain component. Left knee was injected with Dr. Magnus Ivan after last visit with temporary improvement. She is currently in wheelchair while out and about, also using walker for support at home and feels knee is giving out or locking up occasionally.   Previous HPI 07/10/2021  Nancy [name] is a 68 y.o. female here for evaluation of joint pain in multiple areas and abnormal labs concerning for inflammatory arthritis. She apparently had previous rheumatology assessment with [facility] but not on long term DMARD treatment. Bilateral hand pain affecting her knuckles she sees swelling of MCP joints or PIP joints intermittently. On other days not much morning stiffness and pain. She has ongoing knee pain worst on the left side. She saw Dr. Magnus Ivan with intraarticular steroid injection a week ago and she had improvement but still a lot of active joint pain. Mobility is limited using a walker or more often wheelchair due to pain in her knees while weightbearing. She takes tylenol or occasionally hydrocodone for pain.    Labs reviewed ANA neg SSA >8.0 SSB  neg RF 75.1 CCP >250 ESR 38   No Rheumatology ROS completed.   PMFS History:  Patient Active Problem List   Diagnosis Date Noted   (HFpEF) heart failure with preserved ejection fraction (HCC) 02/09/2023   Aortic atherosclerosis (HCC) 08/24/2022   Impaired mobility 04/17/2022   Cervical radiculopathy 04/17/2022   Alpha thalassaemia minor 04/06/2022   Bilateral hand pain 07/10/2021   Rheumatoid factor positive 07/10/2021   Hyperlipidemia 04/28/2021   Mixed stress and urge incontinence 12/16/2020   Asymmetrical sensorineural hearing loss 08/09/2019   Polyarthralgia 12/02/2016   Healthcare maintenance 12/18/2014   GERD (gastroesophageal reflux disease) 04/13/2014   Allergic rhinitis 01/24/2014   Morbid obesity with body mass index (BMI) of 50.0 to 59.9 in adult (HCC) 09/14/2013   Abnormal CT of brain 11/19/2011   MACULAR DEGENERATION, BILATERAL 05/22/2008   Essential hypertension 03/17/2006   SICCA SYNDROME 03/17/2006   KNEE PAIN, CHRONIC 03/17/2006   Chronic low back pain 03/17/2006   Obstructive sleep apnea 09/07/2003   Hypothyroidism 09/07/1998   Microcytic anemia 09/07/1998    Past Medical History:  Diagnosis Date   Acute cholecystitis 01/21/2021   AKI (acute kidney injury) (HCC) 12/04/2018   Back pain    chronic low back pain L4-5 discectomy:11/99. degenerative thoracic spondylotic changes 4/07   Chest pain    neg adenosine myoview.   Hypertension    no LVH EKG-7/05, nl M/C ratio 4/07  Hypothyroidism    09/1998   Lower extremity edema    echo EF 55-65% w/o evidence of Dias dysfx.,    Menorrhagia    Microcytic anemia    09/1998. Hb-10.5/MCV 76. needs ferritin to determine ACD vs IDA   Morbid obesity (HCC)    Obstructive sleep apnea    severe 7/05 RD 161 per hr. /CPAP 18cwp   Plantar fasciitis of left foot 12/16/2020   Pneumonia due to COVID-19 virus 12/04/2018   Polyarthralgia    (knee/back/ankle) w/dx of fibromyalgia? given by [facility], knee pain chronic  2/2 obesity, fibromyalgia and Sjogren's.   Primary Sjogren's syndrome (HCC)    anti Ro+;ANA>1:1280 in homogen pattern, negative ds DNA/RF/anti Smith/RNP/C3-4 comp/la/jo1/Scleroderma/centromere, neg HIV/ACE/Hep B/C, nl CXR 2/05, schirmer salivary glandtest not done, symptom rx:eye drops/prednisone/plaquenil referral to [facility] rhe[facility], [name] 3/07. saw [name] but stopped 2/2 cost.   S/P tooth extraction 12/22/2021   Subacute cough 08/15/2009   Qualifier: Diagnosis of   By: Aleene Davidson MD, Elna Breslow       Uterine bleeding    Viral URI with cough 11/03/2017    Family History  Problem Relation Age of Onset   Hypertension Mother    Cancer Mother    Cancer Father    Cancer Sister    Ovarian cancer Other        family history   Breast cancer Other        family history in 1st degree relatives.   Thyroid disease Sister    Colon cancer Neg Hx    Stomach cancer Neg Hx    Past Surgical History:  Procedure Laterality Date   CHOLECYSTECTOMY N/A 01/23/2021   Procedure: LAPAROSCOPIC CHOLECYSTECTOMY;  Surgeon: Violeta Gelinas, MD;  Location: [facility] OR;  Service: General;  Laterality: N/A;   EXAMINATION UNDER ANESTHESIA  11/23/2011   Procedure: EXAM UNDER ANESTHESIA;  Surgeon: Tereso Newcomer, MD;  Location: [facility] ORS;  Service: Gynecology;  Laterality: N/A;   HYSTEROSCOPY WITH D & C  11/23/2011   Procedure: DILATATION AND CURETTAGE /HYSTEROSCOPY;  Surgeon: Tereso Newcomer, MD;  Location: [facility] ORS;  Service: Gynecology;  Laterality: N/A;   LUMBAR FUSION  1999   SHOULDER SURGERY  unk   TONSILLECTOMY     TUBAL LIGATION  1977   WISDOM TOOTH EXTRACTION     Social History   Social History Narrative   Her daughter [name]) often comes with her to visits. She also has a grand daughter and great grand daughter(whose name is Land)   Immunization History  Administered Date(s) Administered   Fluad Quad(high Dose 65+) 12/08/2018, 12/16/2020   Moderna Covid-19 Vaccine Bivalent Booster 78yrs & up  12/28/2020   PFIZER(Purple Top)SARS-COV-2 Vaccination 05/18/2019, 06/12/2019, 12/16/2019   PNEUMOCOCCAL CONJUGATE-20 12/16/2020   Pneumococcal Polysaccharide-23 12/08/2018   Td 04/29/2009, 09/13/2009   Unspecified SARS-COV-2 Vaccination 12/07/2021   Zoster Recombinant(Shingrix) 01/10/2022, 02/08/2022     Objective: Vital Signs: There were no vitals taken for this visit.   Physical Exam   Musculoskeletal Exam: ***  CDAI Exam: CDAI Score: -- Patient Global: --; Provider Global: -- Swollen: --; Tender: -- Joint Exam 04/20/2023   No joint exam has been documented for this visit   There is currently no information documented on the homunculus. Go to the Rheumatology activity and complete the homunculus joint exam.  Investigation: No additional findings.  Imaging: No results found.  Recent Labs: Lab Results  Component Value Date   WBC 5.9 02/09/2023   HGB 10.1 (L) 02/09/2023  PLT 268 02/09/2023   NA 141 03/05/2023   K 3.9 03/05/2023   CL 97 03/05/2023   CO2 30 (H) 03/05/2023   GLUCOSE 108 (H) 03/05/2023   BUN 18 03/05/2023   CREATININE 1.13 (H) 03/05/2023   BILITOT 0.6 02/09/2023   ALKPHOS 61 02/09/2023   AST 13 (L) 02/09/2023   ALT 9 02/09/2023   PROT 7.5 02/09/2023   ALBUMIN 3.4 (L) 02/09/2023   CALCIUM 9.3 03/05/2023   GFRAA >60 09/06/2019    Speciality Comments: No specialty comments available.  Procedures:  No procedures performed Allergies: Amlodipine, Coconut fatty acid, and Naproxen sodium   Assessment / Plan:     Visit Diagnoses: No diagnosis found.  ***  Orders: No orders of the defined types were placed in this encounter.  No orders of the defined types were placed in this encounter.    Follow-Up Instructions: No follow-ups on file.   Metta Clines, RT  Note - This record has been created using [employer].  Chart creation errors have been sought, but may not always  have been located. Such creation errors do not reflect on   the standard of medical care.

## 2023-04-07 ENCOUNTER — Encounter: Payer: Self-pay | Admitting: Internal Medicine

## 2023-04-07 LAB — BMP8+ANION GAP
Anion Gap: 12 mmol/L (ref 10.0–18.0)
BUN/Creatinine Ratio: 12 (ref 12–28)
BUN: 11 mg/dL (ref 8–27)
CO2: 26 mmol/L (ref 20–29)
Calcium: 8.8 mg/dL (ref 8.7–10.3)
Chloride: 101 mmol/L (ref 96–106)
Creatinine, Ser: 0.89 mg/dL (ref 0.57–1.00)
Glucose: 110 mg/dL — ABNORMAL HIGH (ref 70–99)
Potassium: 4.2 mmol/L (ref 3.5–5.2)
Sodium: 139 mmol/L (ref 134–144)
eGFR: 70 mL/min/{1.73_m2}

## 2023-04-20 ENCOUNTER — Ambulatory Visit: Payer: Medicare PPO | Admitting: Internal Medicine

## 2023-04-20 DIAGNOSIS — M3500 Sicca syndrome, unspecified: Secondary | ICD-10-CM

## 2023-04-20 DIAGNOSIS — Z79899 Other long term (current) drug therapy: Secondary | ICD-10-CM

## 2023-04-20 DIAGNOSIS — M2550 Pain in unspecified joint: Secondary | ICD-10-CM

## 2023-04-20 DIAGNOSIS — R768 Other specified abnormal immunological findings in serum: Secondary | ICD-10-CM

## 2023-04-20 DIAGNOSIS — Z993 Dependence on wheelchair: Secondary | ICD-10-CM

## 2023-04-28 DIAGNOSIS — M25662 Stiffness of left knee, not elsewhere classified: Secondary | ICD-10-CM | POA: Diagnosis not present

## 2023-04-28 DIAGNOSIS — R262 Difficulty in walking, not elsewhere classified: Secondary | ICD-10-CM | POA: Diagnosis not present

## 2023-04-28 DIAGNOSIS — M25562 Pain in left knee: Secondary | ICD-10-CM | POA: Diagnosis not present

## 2023-04-28 DIAGNOSIS — M1712 Unilateral primary osteoarthritis, left knee: Secondary | ICD-10-CM | POA: Diagnosis not present

## 2023-05-07 DIAGNOSIS — R262 Difficulty in walking, not elsewhere classified: Secondary | ICD-10-CM | POA: Diagnosis not present

## 2023-05-07 DIAGNOSIS — M25662 Stiffness of left knee, not elsewhere classified: Secondary | ICD-10-CM | POA: Diagnosis not present

## 2023-05-07 DIAGNOSIS — M1712 Unilateral primary osteoarthritis, left knee: Secondary | ICD-10-CM | POA: Diagnosis not present

## 2023-05-07 DIAGNOSIS — M25562 Pain in left knee: Secondary | ICD-10-CM | POA: Diagnosis not present

## 2023-05-14 DIAGNOSIS — M1712 Unilateral primary osteoarthritis, left knee: Secondary | ICD-10-CM | POA: Diagnosis not present

## 2023-05-14 DIAGNOSIS — M25562 Pain in left knee: Secondary | ICD-10-CM | POA: Diagnosis not present

## 2023-05-14 DIAGNOSIS — R262 Difficulty in walking, not elsewhere classified: Secondary | ICD-10-CM | POA: Diagnosis not present

## 2023-05-14 DIAGNOSIS — M25662 Stiffness of left knee, not elsewhere classified: Secondary | ICD-10-CM | POA: Diagnosis not present

## 2023-05-25 ENCOUNTER — Encounter: Payer: Medicare PPO | Admitting: Internal Medicine

## 2023-05-29 ENCOUNTER — Emergency Department (HOSPITAL_COMMUNITY)
Admission: EM | Admit: 2023-05-29 | Discharge: 2023-05-29 | Disposition: A | Discharge status: Home / Self Care | Attending: Emergency Medicine | Admitting: Emergency Medicine

## 2023-05-29 ENCOUNTER — Encounter (HOSPITAL_COMMUNITY): Payer: Self-pay

## 2023-05-29 ENCOUNTER — Emergency Department (HOSPITAL_COMMUNITY)

## 2023-05-29 DIAGNOSIS — I509 Heart failure, unspecified: Secondary | ICD-10-CM | POA: Diagnosis not present

## 2023-05-29 DIAGNOSIS — E039 Hypothyroidism, unspecified: Secondary | ICD-10-CM | POA: Insufficient documentation

## 2023-05-29 DIAGNOSIS — R918 Other nonspecific abnormal finding of lung field: Secondary | ICD-10-CM | POA: Diagnosis not present

## 2023-05-29 DIAGNOSIS — J111 Influenza due to unidentified influenza virus with other respiratory manifestations: Secondary | ICD-10-CM

## 2023-05-29 DIAGNOSIS — R0602 Shortness of breath: Secondary | ICD-10-CM | POA: Diagnosis not present

## 2023-05-29 DIAGNOSIS — R059 Cough, unspecified: Secondary | ICD-10-CM | POA: Diagnosis present

## 2023-05-29 DIAGNOSIS — Z79899 Other long term (current) drug therapy: Secondary | ICD-10-CM | POA: Insufficient documentation

## 2023-05-29 DIAGNOSIS — I110 Hypertensive heart disease with heart failure: Secondary | ICD-10-CM | POA: Insufficient documentation

## 2023-05-29 DIAGNOSIS — J189 Pneumonia, unspecified organism: Secondary | ICD-10-CM | POA: Diagnosis not present

## 2023-05-29 DIAGNOSIS — J188 Other pneumonia, unspecified organism: Secondary | ICD-10-CM

## 2023-05-29 DIAGNOSIS — M549 Dorsalgia, unspecified: Secondary | ICD-10-CM | POA: Diagnosis not present

## 2023-05-29 DIAGNOSIS — J101 Influenza due to other identified influenza virus with other respiratory manifestations: Secondary | ICD-10-CM | POA: Diagnosis not present

## 2023-05-29 LAB — CBC
HCT: 37 % (ref 36.0–46.0)
Hemoglobin: 11.5 g/dL — ABNORMAL LOW (ref 12.0–15.0)
MCH: 23.5 pg — ABNORMAL LOW (ref 26.0–34.0)
MCHC: 31.1 g/dL (ref 30.0–36.0)
MCV: 75.7 fL — ABNORMAL LOW (ref 80.0–100.0)
Platelets: 290 10*3/uL (ref 150–400)
RBC: 4.89 MIL/uL (ref 3.87–5.11)
RDW: 16.4 % — ABNORMAL HIGH (ref 11.5–15.5)
WBC: 5.7 10*3/uL (ref 4.0–10.5)
nRBC: 0 % (ref 0.0–0.2)

## 2023-05-29 LAB — TROPONIN I (HIGH SENSITIVITY): Troponin I (High Sensitivity): 5 ng/L

## 2023-05-29 LAB — BASIC METABOLIC PANEL WITH GFR
Anion gap: 12 (ref 5–15)
BUN: 11 mg/dL (ref 8–23)
CO2: 24 mmol/L (ref 22–32)
Calcium: 9 mg/dL (ref 8.9–10.3)
Chloride: 100 mmol/L (ref 98–111)
Creatinine, Ser: 0.92 mg/dL (ref 0.44–1.00)
GFR, Estimated: >60 mL/min
Glucose, Bld: 108 mg/dL — ABNORMAL HIGH (ref 70–99)
Potassium: 3.6 mmol/L (ref 3.5–5.1)
Sodium: 136 mmol/L (ref 135–145)

## 2023-05-29 LAB — RESP PANEL BY RT-PCR (RSV, FLU A&B, COVID)  RVPGX2
Influenza A by PCR: NEGATIVE
Influenza B by PCR: NEGATIVE
Resp Syncytial Virus by PCR: NEGATIVE
SARS Coronavirus 2 by RT PCR: NEGATIVE

## 2023-05-29 LAB — BRAIN NATRIURETIC PEPTIDE: B Natriuretic Peptide: 97.3 pg/mL (ref 0.0–100.0)

## 2023-05-29 MED ORDER — ACETAMINOPHEN 500 MG PO TABS
1000.0000 mg | ORAL_TABLET | Freq: Four times a day (QID) | ORAL | Status: DC | PRN
  Administered 2023-05-29: 1000 mg via ORAL
  Filled 2023-05-29: qty 2

## 2023-05-29 MED ORDER — LEVOFLOXACIN 750 MG PO TABS
750.0000 mg | ORAL_TABLET | Freq: Once | ORAL | Status: AC
  Administered 2023-05-29: 750 mg via ORAL
  Filled 2023-05-29: qty 1

## 2023-05-29 MED ORDER — LEVOFLOXACIN 750 MG PO TABS: 750.0000 mg | ORAL_TABLET | Freq: Every day | ORAL | 0 refills | Status: DC

## 2023-05-29 NOTE — Discharge Instructions (Addendum)
 The antibiotic as directed.  Follow-up on the respiratory panel on MyChart.  Would expect some improvement over the next 1 to 2 days.  Return for anything new or worse.  Also recommend Exer strength Tylenol 2 tablets every 8 hours for any discomfort.

## 2023-05-29 NOTE — ED Notes (Signed)
 Pt has hx of back surgery in 1999 with chronic back pain since.  Pt uses a wheelchair and or walker most of the time.  With a walker she took about 4 steps, she was breathing pretty heavily from pain but never dropped sats below 96%.

## 2023-05-29 NOTE — ED Triage Notes (Signed)
 Pt c/o L side back pain and SOB w/ exertion x1 week.  Pain score 10/10.  Hx of CHF.  Denies injury.

## 2023-05-29 NOTE — ED Provider Notes (Signed)
 [place] [facility] AT [facility] Provider Note   CSN: [id number] Arrival date & time: 05/29/23  4098     History  Chief Complaint  Patient presents with   Back Pain   Shortness of Breath    Nancy [name] is a 68 y.o. female.  Patient with 1 week of shortness of breath kind of body aches fatigue some cough some congestion history of CHF.  Temp 98.4 pulse 72 respirations 18 blood pressure 173/80 oxygen saturation is 93% on room air.  Patient usually walks with a walker or wheelchair.  Past medical history significant for surgery and syndrome hypertension morbid obesity longstanding history of back pain hypothyroidism lower extremity edema polyarthralgia.  Patient is never used tobacco products.  Patient's main complaint was left-sided back pain and shortness of breath.       Home Medications Prior to Admission medications   Medication Sig Start Date End Date Taking? Authorizing Provider  albuterol (PROVENTIL HFA;VENTOLIN HFA) 108 (90 Base) MCG/ACT inhaler Inhale 1-2 puffs every 6 (six) hours as needed into the lungs for wheezing or shortness of breath. 01/22/17   Harris, Cammy Copa, PA-C  amLODipine (NORVASC) 5 MG tablet Take 1 tablet (5 mg total) by mouth daily. 10/21/22   Dickie La, MD  atorvastatin (LIPITOR) 80 MG tablet Take 1 tablet (80 mg total) by mouth daily. 06/01/22   Dickie La, MD  chlorthalidone (HYGROTON) 25 MG tablet Take 1 tablet (25 mg total) by mouth daily. 02/23/23   Dickie La, MD  diclofenac Sodium (VOLTAREN) 1 % GEL Apply 4 g topically every 6 (six) hours as needed (arthritis pain). 08/24/22   Dickie La, MD  furosemide (LASIX) 40 MG tablet Take 1 tablet (40 mg total) by mouth daily. 02/23/23   Dickie La, MD  gabapentin (NEURONTIN) 300 MG capsule Take 1 capsule (300 mg total) by mouth at bedtime as needed. 06/01/22   Dickie La, MD  HYDROcodone-acetaminophen (NORCO/VICODIN) 5-325 MG tablet Take 1 tablet by mouth 2 (two) times daily as needed  for severe pain (pain score 7-10). 03/31/23   Dickie La, MD  levothyroxine (SYNTHROID) 50 MCG tablet Take 1 tablet (50 mcg total) by mouth daily. 08/17/22   Dickie La, MD  losartan (COZAAR) 100 MG tablet Take 1 tablet (100 mg total) by mouth daily. 06/01/22   Dickie La, MD  metoprolol succinate (TOPROL-XL) 50 MG 24 hr tablet Take 1 tablet (50 mg total) by mouth daily. Take with or immediately following a meal. 08/17/22   Dickie La, MD  Mouthwashes (BIOTENE DRY MOUTH GENTLE) LIQD Use as directed 15 mLs in the mouth or throat 3 (three) times daily as needed (Swish and spit up to three times daily as needed.). 04/06/22   Dickie La, MD      Allergies    Amlodipine, Coconut fatty acid, and Naproxen sodium    Review of Systems   Review of Systems  Constitutional:  Positive for fatigue. Negative for chills and fever.  HENT:  Negative for ear pain and sore throat.   Eyes:  Negative for pain and visual disturbance.  Respiratory:  Positive for cough and shortness of breath.   Cardiovascular:  Negative for chest pain and palpitations.  Gastrointestinal:  Negative for abdominal pain and vomiting.  Genitourinary:  Negative for dysuria and hematuria.  Musculoskeletal:  Positive for back pain and myalgias. Negative for arthralgias.  Skin:  Negative for color change and rash.  Neurological:  Negative for seizures and syncope.  All other  systems reviewed and are negative.   Physical Exam Updated Vital Signs BP (!) 163/86   Pulse 78   Temp 98.3 F (36.8 C)   Resp 16   Ht 1.6 m (5\' 3" )   Wt 127.9 kg   SpO2 95%   BMI 49.95 kg/m  Physical Exam Vitals and nursing note reviewed.  Constitutional:      General: She is not in acute distress.    Appearance: Normal appearance. She is well-developed. She is obese. She is not ill-appearing.  HENT:     Head: Normocephalic and atraumatic.  Eyes:     Extraocular Movements: Extraocular movements intact.     Conjunctiva/sclera: Conjunctivae normal.      Pupils: Pupils are equal, round, and reactive to light.  Cardiovascular:     Rate and Rhythm: Normal rate and regular rhythm.     Heart sounds: No murmur heard. Pulmonary:     Effort: Pulmonary effort is normal. No respiratory distress.     Breath sounds: Normal breath sounds. No wheezing or rales.  Abdominal:     Palpations: Abdomen is soft.     Tenderness: There is no abdominal tenderness.  Musculoskeletal:        General: No swelling.     Cervical back: Normal range of motion and neck supple.     Right lower leg: Edema present.     Left lower leg: Edema present.  Skin:    General: Skin is warm and dry.     Capillary Refill: Capillary refill takes less than 2 seconds.  Neurological:     Mental Status: She is alert.  Psychiatric:        Mood and Affect: Mood normal.     ED Results / Procedures / Treatments   Labs (all labs ordered are listed, but only abnormal results are displayed) Labs Reviewed  BASIC METABOLIC PANEL - Abnormal; Notable for the following components:      Result Value   Glucose, Bld 108 (*)    All other components within normal limits  CBC - Abnormal; Notable for the following components:   Hemoglobin 11.5 (*)    MCV 75.7 (*)    MCH 23.5 (*)    RDW 16.4 (*)    All other components within normal limits  RESP PANEL BY RT-PCR (RSV, FLU A&B, COVID)  RVPGX2  BRAIN NATRIURETIC PEPTIDE  TROPONIN I (HIGH SENSITIVITY)    EKG None  Radiology DG Chest 2 View Result Date: 05/29/2023 CLINICAL DATA:  Shortness of breath and left back pain EXAM: CHEST - 2 VIEW COMPARISON:  Chest radiograph dated 04/06/2022 FINDINGS: Normal lung volumes. New hazy left mid lung opacity and diffuse patchy right upper lung opacities. No pleural effusion or pneumothorax. Similar mildly enlarged cardiomediastinal silhouette. No acute osseous abnormality. IMPRESSION: New hazy left mid lung opacity and diffuse patchy right upper lung opacities, suspicious for multifocal pneumonia in the  acute setting. Electronically Signed   By: Agustin Cree M.D.   On: 05/29/2023 11:26    Procedures Procedures    Medications Ordered in ED Medications  acetaminophen (TYLENOL) tablet 1,000 mg (1,000 mg Oral Given 05/29/23 1340)  levofloxacin (LEVAQUIN) tablet 750 mg (has no administration in time range)    ED Course/ Medical Decision Making/ A&P                                 Medical Decision Making Amount and/or Complexity of  Data Reviewed Labs: ordered. Radiology: ordered.  Risk OTC drugs. Prescription drug management.   Lab workup respiratory panel pending.  Basic metabolic panel normal.  CBC no leukocytosis hemoglobin 11.5.  Troponin 5 BNP 97.32 view chest showed evidence of multifocal pneumonia with evidence of left mid lung opacity and right upper lung opacities.  Clinically based on the white count we are suspicious for viral component.  Did ambulate patient bit of a challenge she did kind of feel fatigued.  But oxygen sats never dropped below 91%.  Based on this conversation with family we did offer up IV antibiotics for the multifocal pneumonia although could be viral but she is a difficult stick so we are going to go with p.o. Levaquin and will continue that at home.  Could be a bacterial component on top.  Respiratory panel pending.  Patient not meeting criteria for admission even if positive for COVID RSV or influenza.  Will continue the Levaquin at home.  Patient lives with family they will watch her carefully she will return for any new or worse symptoms.  Hopefully she will improve in the next 24 to 48 hours.   Final Clinical Impression(s) / ED Diagnoses Final diagnoses:  Multifocal pneumonia  Influenza-like illness    Rx / DC Orders ED Discharge Orders     None         Vanetta Mulders, MD 05/29/23 1452

## 2023-06-01 ENCOUNTER — Ambulatory Visit (INDEPENDENT_AMBULATORY_CARE_PROVIDER_SITE_OTHER): Payer: Medicare PPO | Admitting: Internal Medicine

## 2023-06-01 ENCOUNTER — Other Ambulatory Visit (HOSPITAL_COMMUNITY): Payer: Self-pay

## 2023-06-01 ENCOUNTER — Encounter: Payer: Self-pay | Admitting: Internal Medicine

## 2023-06-01 VITALS — BP 152/68 | HR 67 | Temp 98.0°F | Ht 63.0 in | Wt 273.5 lb

## 2023-06-01 DIAGNOSIS — M2550 Pain in unspecified joint: Secondary | ICD-10-CM

## 2023-06-01 DIAGNOSIS — I5032 Chronic diastolic (congestive) heart failure: Secondary | ICD-10-CM | POA: Diagnosis not present

## 2023-06-01 DIAGNOSIS — M25562 Pain in left knee: Secondary | ICD-10-CM

## 2023-06-01 DIAGNOSIS — G4733 Obstructive sleep apnea (adult) (pediatric): Secondary | ICD-10-CM | POA: Diagnosis not present

## 2023-06-01 DIAGNOSIS — G8929 Other chronic pain: Secondary | ICD-10-CM

## 2023-06-01 DIAGNOSIS — I110 Hypertensive heart disease with heart failure: Secondary | ICD-10-CM | POA: Diagnosis not present

## 2023-06-01 DIAGNOSIS — Z6841 Body Mass Index (BMI) 40.0 and over, adult: Secondary | ICD-10-CM | POA: Diagnosis not present

## 2023-06-01 DIAGNOSIS — E785 Hyperlipidemia, unspecified: Secondary | ICD-10-CM | POA: Diagnosis not present

## 2023-06-01 DIAGNOSIS — I10 Essential (primary) hypertension: Secondary | ICD-10-CM

## 2023-06-01 DIAGNOSIS — Z1231 Encounter for screening mammogram for malignant neoplasm of breast: Secondary | ICD-10-CM

## 2023-06-01 DIAGNOSIS — E039 Hypothyroidism, unspecified: Secondary | ICD-10-CM | POA: Diagnosis not present

## 2023-06-01 DIAGNOSIS — I700 Atherosclerosis of aorta: Secondary | ICD-10-CM | POA: Diagnosis not present

## 2023-06-01 DIAGNOSIS — J188 Other pneumonia, unspecified organism: Secondary | ICD-10-CM | POA: Insufficient documentation

## 2023-06-01 DIAGNOSIS — J189 Pneumonia, unspecified organism: Secondary | ICD-10-CM | POA: Diagnosis not present

## 2023-06-01 MED ORDER — ALBUTEROL SULFATE HFA 108 (90 BASE) MCG/ACT IN AERS
1.0000 | INHALATION_SPRAY | Freq: Four times a day (QID) | RESPIRATORY_TRACT | 0 refills | Status: DC | PRN
  Filled 2023-06-01: qty 6.7, 25d supply, fill #0

## 2023-06-01 MED ORDER — AMLODIPINE BESYLATE 5 MG PO TABS
10.0000 mg | ORAL_TABLET | Freq: Every day | ORAL | 11 refills | Status: DC
  Filled 2023-06-01: qty 60, 30d supply, fill #0

## 2023-06-01 MED ORDER — HYDROCODONE-ACETAMINOPHEN 5-325 MG PO TABS
1.0000 | ORAL_TABLET | Freq: Two times a day (BID) | ORAL | 0 refills | Status: DC | PRN
  Filled 2023-06-01: qty 30, 15d supply, fill #0

## 2023-06-01 NOTE — Assessment & Plan Note (Addendum)
 Weight decreased from last visit following diuresis. Unable to tolerate orlistat. Unsuccessful with lifestyle change. Bupropion and phentermine products not a good option with uncontrolled BP. GLP1-Ras would be beneficial, may consider trying Zepbound given history of OSA.  Plan -F/u with patient regarding Zepbound

## 2023-06-01 NOTE — Assessment & Plan Note (Signed)
 Seen in the ED 3/22 for productive cough, dyspnea, and left back pain. CXR with evidence of left mid-lung and RUL opacity, concerning for multifocal pneumonia. No leukocytosis. Oxygen saturation maintained with ambulation. Started on levofloxacin for 7 days. Since discharge, Nancy [name] has felt improvement in cough and dyspnea. She has been afebrile. Left mid-back pain continues. No urinary symptoms. Loose stools since starting levofloxacin, no increased frequency. Low appetite, drinking plenty.  Exam without skin changes or rash. RRR. Lungs clear bilaterally. Tenderness to palpation over left mid-thoracic back, also worse with coughing or deep breaths. No CVA tenderness.   Concern for pleurisy from adjacent infection as cause of current pain. I have encouraged Nancy [name] to continue antibiotics and monitor for improvement. Continue hydrocodone-acetaminophen daily prn for pain. Additional Tylenol okay at current dose, reviewed maximum daily acetaminophen dose. If pain persists into next week, we may consider additional chest imaging. Of note, Nancy [name] does have a history of suspected RA and may have additional risks for pleural inflammation. Low suspicion for PE on today's exam.   Plan -Finish levofloxacine 750 mg daily -Call within one week if left back pain not improved or worsens

## 2023-06-01 NOTE — Progress Notes (Signed)
 Established Patient Office Visit  Subjective   Patient ID: Nancy [name], female    DOB: Nov 05, 1955  Age: 68 y.o. MRN: [id number]  Chief Complaint  Patient presents with   Medical Management of Chronic Issues    Medication refill. BP check.   Follow-up    ER - PNA , still on abx's. Cough. Left flank pain.    Nancy [name] returns for follow-up of chronic conditions. Recently seen in the ED 3/22 for cough and dyspnea, imaging findings concerning for multifocal pneumonia. Discharged with course of levofloxacin. Please see assessment/plan in problem-based charting for further details of today's visit.    Patient Active Problem List   Diagnosis Date Noted   Multifocal pneumonia 06/01/2023   (HFpEF) heart failure with preserved ejection fraction (HCC) 02/09/2023   Aortic atherosclerosis (HCC) 08/24/2022   Impaired mobility 04/17/2022   Cervical radiculopathy 04/17/2022   Alpha thalassaemia minor 04/06/2022   Bilateral hand pain 07/10/2021   Rheumatoid factor positive 07/10/2021   Hyperlipidemia 04/28/2021   Mixed stress and urge incontinence 12/16/2020   Asymmetrical sensorineural hearing loss 08/09/2019   Polyarthralgia 12/02/2016   GERD (gastroesophageal reflux disease) 04/13/2014   Allergic rhinitis 01/24/2014   Morbid obesity with body mass index (BMI) of 45.0 to 49.9 in adult [facility]) 09/14/2013   Abnormal CT of brain 11/19/2011   MACULAR DEGENERATION, BILATERAL 05/22/2008   Essential hypertension 03/17/2006   SICCA SYNDROME 03/17/2006   KNEE PAIN, CHRONIC 03/17/2006   Chronic low back pain 03/17/2006   Obstructive sleep apnea 09/07/2003   Hypothyroidism 09/07/1998   Microcytic anemia 09/07/1998      Objective:     BP (!) 152/68 (BP Location: Left Arm, Patient Position: Sitting, Cuff Size: Large)   Pulse 67   Temp 98 F (36.7 C) (Oral)   Ht 5\' 3"  (1.6 m)   Wt 273 lb 8 oz (124.1 kg)   SpO2 97% Comment: RA  BMI 48.45 kg/m  BP Readings from Last 3 Encounters:   06/01/23 (!) 152/68  05/29/23 (!) 167/81  03/05/23 133/73   Wt Readings from Last 3 Encounters:  06/01/23 273 lb 8 oz (124.1 kg)  05/29/23 282 lb (127.9 kg)  02/23/23 282 lb 12.8 oz (128.3 kg)      Physical Exam Vitals reviewed.  Constitutional:      General: She is not in acute distress.    Appearance: Normal appearance. She is not ill-appearing or toxic-appearing.  Eyes:     Conjunctiva/sclera: Conjunctivae normal.  Cardiovascular:     Rate and Rhythm: Normal rate and regular rhythm.  Pulmonary:     Effort: Pulmonary effort is normal. No respiratory distress.     Breath sounds: Normal breath sounds. No rhonchi.  Musculoskeletal:        General: Tenderness (tendernress over left mid-thoracic region) present.  Skin:    General: Skin is warm and dry.     Findings: No rash.  Neurological:     General: No focal deficit present.     Mental Status: She is alert. Mental status is at baseline.  Psychiatric:        Mood and Affect: Mood normal.        Behavior: Behavior normal.       Assessment & Plan:   Problem List Items Addressed This Visit       Cardiovascular and Mediastinum   Essential hypertension   Blood pressure elevated today. Nancy [name] reports not taking all of her medication last week while she was  ill but took them all last night. She is having pain today, although borderline to frankly elevated at all recent visits. Previously on higher dose of amlodipine. We discussed increasing amlodipine today, decreased at prior visit due to swelling. Swelling resolved now, most likely 2/2 HFpEF. Asked Nancy [name] to call with any worsening of swelling with increased dose. She is on maximum dose of ARB and chlorthalidone. If remains uncontrolled or unable to tolerate amlodipine, we can consider longer-acting ARB vs. addition of spiro.   Plan -Continue losartan 100 mg, chlorthalidone 25 mg, and metoprolol 50 mg daily; increase amlodipine to 10 mg daily -Continue furosemide  40 mg daily -Recent BMP in ED reviewed      Relevant Medications   amLODipine (NORVASC) 5 MG tablet   Aortic atherosclerosis (HCC)   Noted on prior imaging. Remains on statin therapy.      Relevant Medications   amLODipine (NORVASC) 5 MG tablet   (HFpEF) heart failure with preserved ejection fraction (HCC)   Seen in early December with acute volume overload resulting in dyspnea, lower extremity edema and weight gain. Started on diuresis with significant symptomatic improvement. TTE with normal EF. Will need to discuss SGLT2i at next visit.   Plan -Continue furosemide 40 mg daily, discuss SGLT2i at next visit        Relevant Medications   amLODipine (NORVASC) 5 MG tablet     Respiratory   Obstructive sleep apnea   Previously referred for CPAP titration for known OSA. Referral closed due to expiration. Referral sent again today.   Severe sleep apnea based on study in 2018, AHI 46.8. Zepbound likely a good option for this patient. Will discuss with her.      Relevant Orders   Ambulatory referral to Sleep Studies   Multifocal pneumonia - Primary   Seen in the ED 3/22 for productive cough, dyspnea, and left back pain. CXR with evidence of left mid-lung and RUL opacity, concerning for multifocal pneumonia. No leukocytosis. Oxygen saturation maintained with ambulation. Started on levofloxacin for 7 days. Since discharge, Nancy [name] has felt improvement in cough and dyspnea. She has been afebrile. Left mid-back pain continues. No urinary symptoms. Loose stools since starting levofloxacin, no increased frequency. Low appetite, drinking plenty.  Exam without skin changes or rash. RRR. Lungs clear bilaterally. Tenderness to palpation over left mid-thoracic back, also worse with coughing or deep breaths. No CVA tenderness.   Concern for pleurisy from adjacent infection as cause of current pain. I have encouraged Nancy [name] to continue antibiotics and monitor for improvement. Continue  hydrocodone-acetaminophen daily prn for pain. Additional Tylenol okay at current dose, reviewed maximum daily acetaminophen dose. If pain persists into next week, we may consider additional chest imaging. Of note, Nancy [name] does have a history of suspected RA and may have additional risks for pleural inflammation. Low suspicion for PE on today's exam.   Plan -Finish levofloxacine 750 mg daily -Call within one week if left back pain not improved or worsens      Relevant Medications   albuterol (VENTOLIN HFA) 108 (90 Base) MCG/ACT inhaler     Endocrine   Hypothyroidism   Continues on levothyroxine 50 mcg daily. TSH wnl 02/2023.  Plan -Continue levothyroxine 50 mcg daily        Other   KNEE PAIN, CHRONIC   Chronic left knee pain. She saw an orthopedic physician on 3/7 and underwent left knee injection with improvement in pain. Continues to use hydrocodone-acetaminophen prn and Voltaren gel.  Update Utox at next visit.      Relevant Medications   HYDROcodone-acetaminophen (NORCO/VICODIN) 5-325 MG tablet   Morbid obesity with body mass index (BMI) of 45.0 to 49.9 in adult (HCC)   Weight decreased from last visit following diuresis. Unable to tolerate orlistat. Unsuccessful with lifestyle change. Bupropion and phentermine products not a good option with uncontrolled BP. GLP1-Ras would be beneficial, may consider trying Zepbound given history of OSA.  Plan -F/u with patient regarding Zepbound      Polyarthralgia   Hyperlipidemia   Adherent to daily statin since last visit. Continue.  Plan -Atorvastatin 80 mg daily         Relevant Medications   amLODipine (NORVASC) 5 MG tablet   Other Visit Diagnoses       Screening mammogram for breast cancer       Relevant Orders   MM 3D SCREENING MAMMOGRAM BILATERAL BREAST       Return in about 6 weeks (around 07/13/2023).    Dickie La, MD

## 2023-06-01 NOTE — Assessment & Plan Note (Signed)
 Chronic left knee pain. She saw an orthopedic physician on 3/7 and underwent left knee injection with improvement in pain. Continues to use hydrocodone-acetaminophen prn and Voltaren gel. Update Utox at next visit.

## 2023-06-01 NOTE — Patient Instructions (Addendum)
 It was wonderful to see you today!  For blood pressure, increase amlodipine to 10 mg daily. If you notice worsened leg swelling, let me know!  Call in one week if the pain in your back is not better.    *For exercise classes (in-person and online), group activities, and more*            [email or web address]  Please contact your pharmacy about scheduling the tetanus (Tdap) vaccine.

## 2023-06-01 NOTE — Assessment & Plan Note (Signed)
 Continues on levothyroxine 50 mcg daily. TSH wnl 02/2023.  Plan -Continue levothyroxine 50 mcg daily

## 2023-06-01 NOTE — Assessment & Plan Note (Signed)
 Adherent to daily statin since last visit. Continue.  Plan -Atorvastatin 80 mg daily

## 2023-06-01 NOTE — Assessment & Plan Note (Signed)
 Seen in early December with acute volume overload resulting in dyspnea, lower extremity edema and weight gain. Started on diuresis with significant symptomatic improvement. TTE with normal EF. Will need to discuss SGLT2i at next visit.   Plan -Continue furosemide 40 mg daily, discuss SGLT2i at next visit

## 2023-06-01 NOTE — Assessment & Plan Note (Addendum)
 Previously referred for CPAP titration for known OSA. Referral closed due to expiration. Referral sent again today.   Severe sleep apnea based on study in 2018, AHI 46.8. Zepbound likely a good option for this patient. Will discuss with her.

## 2023-06-01 NOTE — Assessment & Plan Note (Signed)
 Blood pressure elevated today. Nancy [name] reports not taking all of her medication last week while she was ill but took them all last night. She is having pain today, although borderline to frankly elevated at all recent visits. Previously on higher dose of amlodipine. We discussed increasing amlodipine today, decreased at prior visit due to swelling. Swelling resolved now, most likely 2/2 HFpEF. Asked Nancy [name] to call with any worsening of swelling with increased dose. She is on maximum dose of ARB and chlorthalidone. If remains uncontrolled or unable to tolerate amlodipine, we can consider longer-acting ARB vs. addition of spiro.   Plan -Continue losartan 100 mg, chlorthalidone 25 mg, and metoprolol 50 mg daily; increase amlodipine to 10 mg daily -Continue furosemide 40 mg daily -Recent BMP in ED reviewed

## 2023-06-01 NOTE — Assessment & Plan Note (Signed)
Noted on prior imaging. Remains on statin therapy.

## 2023-06-09 ENCOUNTER — Telehealth: Payer: Self-pay | Admitting: *Deleted

## 2023-06-09 NOTE — Telephone Encounter (Signed)
 Mammogram appointment April 16.2025 @ 10:10 am / patient with appointment in hand.

## 2023-06-15 DIAGNOSIS — M25662 Stiffness of left knee, not elsewhere classified: Secondary | ICD-10-CM | POA: Diagnosis not present

## 2023-06-15 DIAGNOSIS — M1712 Unilateral primary osteoarthritis, left knee: Secondary | ICD-10-CM | POA: Diagnosis not present

## 2023-06-15 DIAGNOSIS — R262 Difficulty in walking, not elsewhere classified: Secondary | ICD-10-CM | POA: Diagnosis not present

## 2023-06-15 DIAGNOSIS — M25562 Pain in left knee: Secondary | ICD-10-CM | POA: Diagnosis not present

## 2023-06-23 ENCOUNTER — Ambulatory Visit
Admission: RE | Admit: 2023-06-23 | Discharge: 2023-06-23 | Disposition: A | Discharge status: Home / Self Care | Source: Ambulatory Visit | Attending: Internal Medicine | Admitting: Internal Medicine

## 2023-06-23 DIAGNOSIS — Z1231 Encounter for screening mammogram for malignant neoplasm of breast: Secondary | ICD-10-CM | POA: Diagnosis not present

## 2023-07-06 ENCOUNTER — Telehealth: Payer: Self-pay

## 2023-07-06 NOTE — Telephone Encounter (Signed)
 Called pt and asked her to bring her CPAP Machine with her to her appointment tomorrow.

## 2023-07-07 ENCOUNTER — Institutional Professional Consult (permissible substitution): Admitting: Neurology

## 2023-07-13 NOTE — Telephone Encounter (Signed)
 Called pt and asked her to bring her CPAP Machine with her to her appointment on tomorrow.

## 2023-07-14 ENCOUNTER — Ambulatory Visit (INDEPENDENT_AMBULATORY_CARE_PROVIDER_SITE_OTHER): Admitting: Neurology

## 2023-07-14 ENCOUNTER — Encounter: Payer: Self-pay | Admitting: Neurology

## 2023-07-14 VITALS — BP 174/81 | HR 70 | Ht 63.0 in | Wt 278.0 lb

## 2023-07-14 DIAGNOSIS — G4733 Obstructive sleep apnea (adult) (pediatric): Secondary | ICD-10-CM

## 2023-07-14 DIAGNOSIS — Z789 Other specified health status: Secondary | ICD-10-CM | POA: Diagnosis not present

## 2023-07-14 DIAGNOSIS — M159 Polyosteoarthritis, unspecified: Secondary | ICD-10-CM | POA: Diagnosis not present

## 2023-07-14 DIAGNOSIS — G4719 Other hypersomnia: Secondary | ICD-10-CM | POA: Diagnosis not present

## 2023-07-14 DIAGNOSIS — Z6841 Body Mass Index (BMI) 40.0 and over, adult: Secondary | ICD-10-CM | POA: Diagnosis not present

## 2023-07-14 DIAGNOSIS — R519 Headache, unspecified: Secondary | ICD-10-CM | POA: Diagnosis not present

## 2023-07-14 DIAGNOSIS — R351 Nocturia: Secondary | ICD-10-CM | POA: Diagnosis not present

## 2023-07-14 DIAGNOSIS — E661 Drug-induced obesity: Secondary | ICD-10-CM | POA: Diagnosis not present

## 2023-07-14 DIAGNOSIS — M2550 Pain in unspecified joint: Secondary | ICD-10-CM | POA: Diagnosis not present

## 2023-07-14 NOTE — Progress Notes (Signed)
 Subjective:    Patient ID: Nancy [name] is a 68 y.o. female.  HPI    Nancy Fairy, MD, PhD [facility] [address], [address] [address] [place], [place] [address]  Dear Dr. Ancil Balzarine,   I saw your patient, Nancy [name], upon your kind request in my sleep clinic today for initial consultation of her sleep disorder, in particular, evaluation of her prior diagnosis of OSA. The patient is unaccompanied today. As you know, the patient is a very pleasant 68 year old female with an underlying complex medical history of cholecystitis, back pain, knee pain, hypertension, hypothyroidism, lower extremity edema, Sjogren syndrome, plantar fasciitis, status post multiple surgeries, and severe obesity with a BMI of over 45, who was previously diagnosed with severe obstructive sleep apnea and placed on PAP therapy.  Her Epworth sleepiness score is 13 out of 24, fatigue severity score is 45 out of 63.  She is currently not using her PAP machine, she was quite compliant until end of January but reports that she felt that the pressure was too high, pressure has been at 18 cm for the past nearly 7 years.  She had a baseline sleep study on 10/22/2016, study was interpreted by Dr. Rosa College.  Her total AHI was 46.8/h, O2 nadir 72%.  She had a subsequent CPAP titration study on 11/29/2016, at which time she was placed on CPAP of 18 cm.  She has had some weight fluctuation in the interim, current weight a little higher compared to 2018.  She usually uses a walker but today she is in a clinic wheelchair.  She lives with her husband, she has 5 grandchildren.  They have no pets in the household, she goes to bed between 10 and 11 and rise time is generally between 7 and 7:30 AM.  She has nocturia about 2-3 times per average night.  She reports that the CPAP was causing her recurrent morning headaches.  She still has occasional morning headaches.  She had a tonsillectomy.  She does not drink any alcohol and  she is a non-smoker, she drinks caffeine in the form of coffee, 2 cups in the mornings and 2 cups of tea with dinner.  She does have a TV on in her bedroom and turns it off between 10 and 11 PM.   Her Past Medical History Is Significant For: Past Medical History:  Diagnosis Date   Acute cholecystitis 01/21/2021   AKI (acute kidney injury) (HCC) 12/04/2018   Back pain    chronic low back pain L4-5 discectomy:11/99. degenerative thoracic spondylotic changes 4/07   Chest pain    neg adenosine myoview.   Healthcare maintenance 12/18/2014   Hypertension    no LVH EKG-7/05, nl M/C ratio 4/07   Hypothyroidism    09/1998   Lower extremity edema    echo EF 55-65% w/o evidence of Dias dysfx.,    Menorrhagia    Microcytic anemia    09/1998. Hb-10.5/MCV 76. needs ferritin to determine ACD vs IDA   Morbid obesity (HCC)    Obstructive sleep apnea    severe 7/05 RD 161 per hr. /CPAP 18cwp   Plantar fasciitis of left foot 12/16/2020   Pneumonia due to COVID-19 virus 12/04/2018   Polyarthralgia    (knee/back/ankle) w/dx of fibromyalgia? given by [facility] [facility] rheum, knee pain chronic 2/2 obesity, fibromyalgia and Sjogren's.   Primary Sjogren's syndrome (HCC)    anti Ro+;ANA>1:1280 in homogen pattern, negative ds DNA/RF/anti Smith/RNP/C3-4 comp/la/jo1/Scleroderma/centromere, neg HIV/ACE/Hep B/C, nl CXR  2/05, schirmer salivary glandtest not done, symptom rx:eye drops/prednisone /plaquenil  referral to [facility] rheum, Dr. Cherri Corns 3/07. saw [name] but stopped 2/2 cost.   S/P tooth extraction 12/22/2021   Subacute cough 08/15/2009   Qualifier: Diagnosis of   By: Kurtis Philips MD, Glennis Lansing       Uterine bleeding    Viral URI with cough 11/03/2017    Her Past Surgical History Is Significant For: Past Surgical History:  Procedure Laterality Date   CHOLECYSTECTOMY N/A 01/23/2021   Procedure: LAPAROSCOPIC CHOLECYSTECTOMY;  Surgeon: Dorena Gander, MD;  Location: [facility] OR;  Service: General;  Laterality:  N/A;   EXAMINATION UNDER ANESTHESIA  11/23/2011   Procedure: EXAM UNDER ANESTHESIA;  Surgeon: Julianne Octave, MD;  Location: [facility] ORS;  Service: Gynecology;  Laterality: N/A;   HYSTEROSCOPY WITH D & C  11/23/2011   Procedure: DILATATION AND CURETTAGE /HYSTEROSCOPY;  Surgeon: Julianne Octave, MD;  Location: [facility] ORS;  Service: Gynecology;  Laterality: N/A;   INJECTION KNEE Left 2025   steroid   LUMBAR FUSION  03/09/1997   SHOULDER SURGERY  unk   TONSILLECTOMY     TUBAL LIGATION  03/10/1975   WISDOM TOOTH EXTRACTION      Her Family History Is Significant For: Family History  Problem Relation Age of Onset   Hypertension Mother    Cancer Mother    Cancer Father    Cancer Sister    Thyroid  disease Sister    Ovarian cancer Other        family history   Breast cancer Other        family history in 1st degree relatives.   Colon cancer Neg Hx    Stomach cancer Neg Hx    Sleep apnea Neg Hx     Her Social History Is Significant For: Social History   Socioeconomic History   Marital status: Married    Spouse name: Not on file   Number of children: Not on file   Years of education: Not on file   Highest education level: Not on file  Occupational History   Occupation: house wife    Employer: UNEMPLOYED  Tobacco Use   Smoking status: Never    Passive exposure: Current   Smokeless tobacco: Never  Vaping Use   Vaping status: Never Used  Substance and Sexual Activity   Alcohol use: No    Alcohol/week: 0.0 standard drinks of alcohol   Drug use: No   Sexual activity: Not on file  Other Topics Concern   Not on file  Social History Narrative   Her daughter Gillian Lacrosse) often comes with her to visits. She also has a grand daughter and great grand daughter(whose name is Orinda Birkenhead)   Right handed   Lives with husband   Caffeine: 1-2 cups coffee every morning, maybe 2 teas in evening with dinner   Social Drivers of Health   Financial Resource Strain: Low Risk  (06/01/2022)   Overall  Financial Resource Strain (CARDIA)    Difficulty of Paying Living Expenses: Not hard at all  Food Insecurity: No Food Insecurity (06/01/2022)   Hunger Vital Sign    Worried About Running Out of Food in the Last Year: Never true    Ran Out of Food in the Last Year: Never true  Transportation Needs: No Transportation Needs (06/01/2022)   PRAPARE - Administrator, Civil Service (Medical): No    Lack of Transportation (Non-Medical): No  Physical Activity: Inactive (06/01/2022)   Exercise Vital Sign  Days of Exercise per Week: 0 days    Minutes of Exercise per Session: 0 min  Stress: No Stress Concern Present (06/01/2022)   [employer] of Occupational Health - Occupational Stress Questionnaire    Feeling of Stress : Not at all  Social Connections: Unknown (06/01/2022)   Social Connection and Isolation Panel [NHANES]    Frequency of Communication with Friends and Family: More than three times a week    Frequency of Social Gatherings with Friends and Family: More than three times a week    Attends Religious Services: Patient declined    Database administrator or Organizations: No    Attends Banker Meetings: Never    Marital Status: Married    Her Allergies Are:  Allergies  Allergen Reactions   Amlodipine      Lower extremity swelling.  This reaction only occurred when taking GENERIC amlodipine .  She previously tolerated brand name Norvasc  well.   Coconut Fatty Acid     Break out   Naproxen Sodium Nausea And Vomiting    Says she can take ibuprofen    :   Her Current Medications Are:  Outpatient Encounter Medications as of 07/14/2023  Medication Sig   acetaminophen  (TYLENOL ) 500 MG tablet Take 1,000 mg by mouth every 8 (eight) hours as needed for moderate pain (pain score 4-6).   albuterol  (VENTOLIN  HFA) 108 (90 Base) MCG/ACT inhaler Inhale 1-2 puffs into the lungs every 6 (six) hours as needed for wheezing or shortness of breath.   amLODipine  (NORVASC ) 5 MG  tablet Take 2 tablets (10 mg total) by mouth daily.   atorvastatin  (LIPITOR) 80 MG tablet Take 1 tablet (80 mg total) by mouth daily.   chlorthalidone  (HYGROTON ) 25 MG tablet Take 1 tablet (25 mg total) by mouth daily.   diclofenac  Sodium (VOLTAREN ) 1 % GEL Apply 4 g topically every 6 (six) hours as needed (arthritis pain).   furosemide  (LASIX ) 40 MG tablet Take 1 tablet (40 mg total) by mouth daily.   gabapentin  (NEURONTIN ) 300 MG capsule Take 1 capsule (300 mg total) by mouth at bedtime as needed.   HYDROcodone -acetaminophen  (NORCO/VICODIN) 5-325 MG tablet Take 1 tablet by mouth 2 (two) times daily as needed for severe pain (pain score 7-10).   levofloxacin  (LEVAQUIN ) 750 MG tablet Take 1 tablet (750 mg total) by mouth daily.   levothyroxine  (SYNTHROID ) 50 MCG tablet Take 1 tablet (50 mcg total) by mouth daily.   losartan  (COZAAR ) 100 MG tablet Take 1 tablet (100 mg total) by mouth daily.   metoprolol  succinate (TOPROL -XL) 50 MG 24 hr tablet Take 1 tablet (50 mg total) by mouth daily. Take with or immediately following a meal.   No facility-administered encounter medications on file as of 07/14/2023.  :   Review of Systems:  Out of a complete 14 point review of systems, all are reviewed and negative with the exception of these symptoms as listed below:  Review of Systems  Neurological:        Patient in room alone for sleep consult. She brought her cpap machine with her. She feels cpap helps her sleep sometimes. Sometimes she has restless nights. She does not use her machine much. She feels the pressure is too high. Her DME is [employer]. Her last sleep study was over 5 years ago. She has no known family history of sleep apnea. ESS 13 FSS 45     Objective:  Neurological Exam  Physical Exam Physical Examination:   Vitals:  07/14/23 1334  BP: (!) 174/81  Pulse: 70    General Examination: The patient is a 68 year old female in no acute distress, she appears chronically deconditioned, in  wheelchair.    HEENT: Normocephalic, atraumatic, pupils are equal, round and reactive to light, extraocular tracking is good without limitation to gaze excursion or nystagmus noted. Hearing is grossly intact. Face is symmetric with normal facial animation. Speech is edentulous.  Airway examination reveals mild to moderate mouth dryness and moderate airway crowding secondary to Mallampati class III, larger uvula, larger tongue.  Neck circumference 19-1/8 inches.  She is edentulous.   Chest: Clear to auscultation without wheezing, rhonchi or crackles noted.  Heart: S1+S2+0, regular and normal without murmurs, rubs or gallops noted.   Abdomen: Soft, non-tender and non-distended.  Extremities: There is significant lower extremity edema bilaterally.   Skin: Warm and dry without trophic changes noted.   Musculoskeletal: exam reveals no obvious joint deformities.   Neurologically:  Mental status: The patient is awake, alert and oriented in all 4 spheres. Her immediate and remote memory, attention, language skills and fund of knowledge are appropriate. There is no evidence of aphasia, agnosia, apraxia or anomia. Speech is clear with normal prosody and enunciation. Thought process is linear. Mood is normal and affect is normal.  Cranial nerves II - XII are as described above under HEENT exam.  Motor exam: Normal bulk, limited movement in the lower extremities.  No obvious action or resting tremor.  Fine motor skills and coordination: grossly intact.  Cerebellar testing: No dysmetria or intention tremor.  Sensory exam: intact to light touch in the upper and lower extremities.   Assessment and Plan:  In summary, Nancy [name] is a 68 year old female with an underlying complex medical history of cholecystitis, back pain, knee pain, hypertension, hypothyroidism, lower extremity edema, Sjogren syndrome, plantar fasciitis, status post multiple surgeries, and severe obesity with a BMI of over 45, who  presents for evaluation of her obstructive sleep apnea.  She reports difficulty tolerating her CPAP of 18 cm.  She was diagnosed with severe obstructive sleep apnea in 2018.  She should qualify for new machine.  She was compliant until end of January 2025 and is encouraged to get back on her CPAP machine currently.  We will scale back on the pressure to 16 cm with EPR of 3.  She is advised that we should proceed with a home sleep test for reevaluation and I would like to issue and new machine after testing.  If need be, we can consider an in-lab titration down the road.  She is advised to continue to work on weight loss and follow-up with her current providers especially in light of her lower extremity swelling and chronic medical conditions.  She is advised that we will call her to schedule her home sleep test.  I explained the test procedure to her in detail today.  She is advised to limit her caffeine and we talked about pursuing good sleep hygiene today as well.  We talked about alternative treatment options which are limited.  She is advised that she should try to maintain treatment with PAP therapy.  I answered all her questions today and she was in agreement with our approach.   Thank you very much for allowing me to participate in the care of this nice patient. If I can be of any further assistance to you please do not hesitate to call me at [phone].  Sincerely,   Nancy Fairy, MD, PhD

## 2023-07-15 ENCOUNTER — Telehealth: Payer: Self-pay | Admitting: Neurology

## 2023-07-15 NOTE — Progress Notes (Signed)
 New, Maryella Shivers, Otilio Jefferson, RN; Alain Honey; Jeris Penta, [place]; 1 other Received, thank you!

## 2023-07-15 NOTE — Telephone Encounter (Signed)
 HST- HTA pending

## 2023-07-27 NOTE — Telephone Encounter (Signed)
 HST HTA Siegfried Dress: [id number] (exp. 07/15/23 to 10/13/23)

## 2023-08-10 ENCOUNTER — Ambulatory Visit (INDEPENDENT_AMBULATORY_CARE_PROVIDER_SITE_OTHER): Admitting: Neurology

## 2023-08-10 DIAGNOSIS — R519 Headache, unspecified: Secondary | ICD-10-CM

## 2023-08-10 DIAGNOSIS — Z789 Other specified health status: Secondary | ICD-10-CM

## 2023-08-10 DIAGNOSIS — G4733 Obstructive sleep apnea (adult) (pediatric): Secondary | ICD-10-CM

## 2023-08-10 DIAGNOSIS — G4719 Other hypersomnia: Secondary | ICD-10-CM

## 2023-08-10 DIAGNOSIS — R351 Nocturia: Secondary | ICD-10-CM

## 2023-08-11 ENCOUNTER — Telehealth: Payer: Self-pay

## 2023-08-11 NOTE — Telephone Encounter (Signed)
 LVM for patient, HST device not returned to office yet. VM states device needs to be returned today but 3pm and to please call the office back and let us  know what time she will be bringing it.

## 2023-08-12 NOTE — Progress Notes (Signed)
 See procedure note.

## 2023-08-14 DIAGNOSIS — M2550 Pain in unspecified joint: Secondary | ICD-10-CM | POA: Diagnosis not present

## 2023-08-14 DIAGNOSIS — M159 Polyosteoarthritis, unspecified: Secondary | ICD-10-CM | POA: Diagnosis not present

## 2023-08-14 DIAGNOSIS — E661 Drug-induced obesity: Secondary | ICD-10-CM | POA: Diagnosis not present

## 2023-08-22 ENCOUNTER — Ambulatory Visit: Payer: Self-pay | Admitting: Neurology

## 2023-08-22 DIAGNOSIS — G4733 Obstructive sleep apnea (adult) (pediatric): Secondary | ICD-10-CM

## 2023-08-22 DIAGNOSIS — G4734 Idiopathic sleep related nonobstructive alveolar hypoventilation: Secondary | ICD-10-CM

## 2023-08-22 NOTE — Procedures (Signed)
 [facility]  HOME SLEEP TEST (Watch PAT) REPORT  STUDY DATE: 08/11/2023  DOB: Sep 20, 1955  MRN: [id number]  ORDERING CLINICIAN: Debbra Fairy, MD, PhD   REFERRING CLINICIAN: Bevelyn Bryant, MD   CLINICAL INFORMATION/HISTORY: 68 year old female with an underlying complex medical history of cholecystitis, back pain, knee pain, hypertension, hypothyroidism, lower extremity edema, Sjogren syndrome, plantar fasciitis, status post multiple surgeries, and severe obesity with a BMI of over 45, who was previously diagnosed with severe obstructive sleep apnea and placed on PAP therapy with a set pressure of 18 cm but has had difficulty tolerating CPAP in the recent few months.  She presents for re-evaluation and should qualify for a new machine.  Her CPAP was reduced to 16 cm with EPR for better tolerance recently.  Epworth sleepiness score: 13/24.  BMI: 49.3 kg/m  FINDINGS:   Sleep Summary:   Total Recording Time (hours, min): 5 hours, 1 min  Total Sleep Time (hours, min):  4 hours, 34 min  Percent REM (%):    12%   Respiratory Indices:   Calculated pAHI (per hour):  77.8/hour         REM pAHI:    77/hour       NREM pAHI: 77.9/hour  Central pAHI: 12.4/hour  Oxygen Saturation Statistics:    Oxygen Saturation (%) Mean: 89%   Minimum oxygen saturation (%):                 77%   O2 Saturation Range (%): 77-96%    O2 Saturation (minutes) <=88%: 95.8 min  Pulse Rate Statistics:   Pulse Mean (bpm):    76/min    Pulse Range (51-94/min)   IMPRESSION:   OSA (obstructive sleep apnea), severe Nocturnal hypoxemia Central sleep apnea   RECOMMENDATION:  This home sleep test demonstrates severe obstructive sleep apnea with a total AHI of 77.8/hour and O2 nadir of 77% with significant time below or at 88% saturation of over 90 minutes for the study, indicating nocturnal hypoxemia.  The average oxygen saturation during the study was only 89%.  There was a mild  central sleep apnea component as well.  This can be monitored while on PAP therapy.  The use of sedating medications should be limited and critically reviewed, such as the use of narcotic pain medication.  Snoring was detected, in the mild range range fairly consistently throughout the night, at times moderate.  Ongoing treatment with positive airway pressure is highly recommended. The patient has been on CPAP therapy and should qualify for new machine.  I will write for a new CPAP machine with a set pressure of 16 cm, EPR of 3, she has had trouble tolerating a higher pressure.  A laboratory attended titration study can be considered in the future for optimization of treatment settings and to improve tolerance and compliance, if needed, down the road. Alternative treatment options are limited secondary to the severity of the patient's sleep disordered breathing, but may include surgical treatment with an implantable hypoglossal nerve stimulator (in carefully selected candidates, meeting criteria).  Concomitant weight loss is recommended (where clinically appropriate). Please note, that untreated obstructive sleep apnea may carry additional perioperative morbidity. Patients with significant obstructive sleep apnea should receive perioperative PAP therapy and the surgeons and particularly the anesthesiologist should be informed of the diagnosis and the severity of the sleep disordered breathing. The patient should be cautioned not to drive, work at heights, or operate dangerous or heavy equipment when tired or sleepy. Review and reiteration  of good sleep hygiene measures should be pursued with any patient. Other causes of the patient's symptoms, including circadian rhythm disturbances, an underlying mood disorder, medication effect and/or an underlying medical problem cannot be ruled out based on this test. Clinical correlation is recommended.  The patient and her referring provider will be notified of the test  results. The patient will be seen in follow up in sleep clinic at [facility].  I certify that I have reviewed the raw data recording prior to the issuance of this report in accordance with the standards of the American Academy of Sleep Medicine (AASM).    INTERPRETING PHYSICIAN:   Debbra Fairy, MD, PhD Medical Director, [facility]) Diplomat, ABPN (Neurology and Sleep)   [facility] [address], [address] [place], [place] [address] [phone]

## 2023-08-24 ENCOUNTER — Ambulatory Visit: Payer: Self-pay

## 2023-08-24 NOTE — Telephone Encounter (Signed)
 FYI Only or Action Required?: FYI only for provider  Patient was last seen in primary care on 06/01/2023 by Bevelyn Bryant, MD. Called Nurse Triage reporting Hand Pain. Symptoms began several days ago. Interventions attempted: OTC medications: Tylenol  and Prescription medications: Gabapentin . Symptoms are: left hand pain, left fingers numbness, left foot swelling stable.  Triage Disposition: See Physician Within 24 Hours  Patient/caregiver understands and will follow disposition?: Yes, but will wait               Patient states she has been unable to close her left hand for the last couple of days with pain level being at 5  Reason for Disposition  Numbness (i.e., loss of sensation) in hand or fingers  (Exception: Just tingling; numbness present > 2 weeks.)  Answer Assessment - Initial Assessment Questions 1. ONSET: When did the pain start?     X 2-3 days.  2. LOCATION: Where is the pain located?     Left hand.  3. PAIN: How bad is the pain? (Scale 1-10; or mild, moderate, severe)   - MILD (1-3): doesn't interfere with normal activities   - MODERATE (4-7): interferes with normal activities (e.g., work or school) or awakens from sleep   - SEVERE (8-10): excruciating pain, unable to use hand at all     5/10.  4. WORK OR EXERCISE: Has there been any recent work or exercise that involved this part (i.e., hand or wrist) of the body?     No exercise or injury. She states she thinks it might have been from cooking and lifting pots.  5. CAUSE: What do you think is causing the pain?     She states she thought it might be her arthritis.  6. AGGRAVATING FACTORS: What makes the pain worse? (e.g., using computer)     Squeezing hand or making a fist.  7. OTHER SYMPTOMS: Do you have any other symptoms? (e.g., neck pain, swelling, rash, numbness, fever)     Patient denies swelling, rash, fever. She states there is some numbness in the left fingers and swelling in left  foot.  8. PREGNANCY: Is there any chance you are pregnant? When was your last menstrual period?     N/A.  Called [facility] and spoke with Jayda, no sooner appointments available. Patient states she would like to take the appointment for next week (added to the wait list as well) and if the pain worsens she will go to urgent care or ED.  Protocols used: Hand and Wrist Pain-A-AH

## 2023-08-25 NOTE — Telephone Encounter (Signed)
Called pt and LVM with office number asking for call back.  

## 2023-08-25 NOTE — Telephone Encounter (Signed)
-----   Message from Debbra Fairy sent at 08/22/2023 12:34 PM EDT ----- Patient referred by PCP for reevaluation of her OSA.  She has been on CPAP therapy.  She should be eligible for new machine.  I saw her on 07/14/2023 and she had a home sleep test on 08/11/2023. Please call and notify the patient that the recent home sleep test showed obstructive sleep apnea in the severe range. I recommend ongoing treatment with CPAP therapy and would like to write for a  new machine for her.  We can submit to her existing DME company or switch her to a new company if she prefers.   I recommend working on weight loss aggressively, her oxygen saturations were quite low at times, average only 89%, hopefully a lot better with treatment.  Please also reinforce the need for  compliance with treatment. We will need a FU in sleep clinic for 10 weeks post-PAP set up, please arrange that with me or one of our NPs. Thanks,   Debbra Fairy, MD, PhD [facility])    ----- Message ----- From: Debbra Fairy, MD Sent: 08/22/2023  12:31 PM EDT To: Debbra Fairy, MD

## 2023-08-26 NOTE — Telephone Encounter (Signed)
 Spoke with patient and discussed her sleep study results and recommendations (work aggressively at weight loss, cpap) as noted below. The patient agreed to continue cpap. We discussed the insurance compliance requirements which includes using the machine at least 4 hours at night and also being seen by our office for initial follow-up between 30 and 90 days after setup. Pt was scheduled for 9/15 at 10:45 am arrive at 10:30. Pt ok with still using [employer].   Order sent to [employer]. Report sent to referring provider.

## 2023-08-30 NOTE — Telephone Encounter (Signed)
 New, Maryella Shivers, Otilio Jefferson, RN; Alain Honey; Jeris Penta, [place]; 1 other Received, thank you!

## 2023-09-01 ENCOUNTER — Ambulatory Visit: Payer: Self-pay | Admitting: Student

## 2023-09-01 VITALS — BP 157/90 | HR 76 | Temp 98.3°F | Ht 63.0 in | Wt 272.0 lb

## 2023-09-01 DIAGNOSIS — M79642 Pain in left hand: Secondary | ICD-10-CM

## 2023-09-01 DIAGNOSIS — M069 Rheumatoid arthritis, unspecified: Secondary | ICD-10-CM

## 2023-09-01 DIAGNOSIS — I10 Essential (primary) hypertension: Secondary | ICD-10-CM | POA: Diagnosis not present

## 2023-09-01 DIAGNOSIS — M059 Rheumatoid arthritis with rheumatoid factor, unspecified: Secondary | ICD-10-CM

## 2023-09-01 NOTE — Patient Instructions (Addendum)
 Thank you, [name] for allowing us  to provide your care today. Today we discussed:  -Xray of both hands ordered -Need to follow up with Rhuematology for concern of rheumatoid arthritis    Follow up: Schedule appt with Dr. Karna   Should you have any questions or concerns please call the internal medicine clinic at [phone].    Nancy [name], D.O. [facility]

## 2023-09-01 NOTE — Progress Notes (Signed)
 CC: Acute visit - L hand pain  HPI:  [name] is a 68 y.o. female living with a history stated below and presents today for acute visit. Please see problem based assessment and plan for additional details.  Past Medical History:  Diagnosis Date   Acute cholecystitis 01/21/2021   AKI (acute kidney injury) (HCC) 12/04/2018   Back pain    chronic low back pain L4-5 discectomy:11/99. degenerative thoracic spondylotic changes 4/07   Chest pain    neg adenosine myoview.   Healthcare maintenance 12/18/2014   Hypertension    no LVH EKG-7/05, nl M/C ratio 4/07   Hypothyroidism    09/1998   Lower extremity edema    echo EF 55-65% w/o evidence of Dias dysfx.,    Menorrhagia    Microcytic anemia    09/1998. Hb-10.5/MCV 76. needs ferritin to determine ACD vs IDA   Morbid obesity (HCC)    Obstructive sleep apnea    severe 7/05 RD 161 per hr. /CPAP 18cwp   Plantar fasciitis of left foot 12/16/2020   Pneumonia due to COVID-19 virus 12/04/2018   Polyarthralgia    (knee/back/ankle) w/dx of fibromyalgia? given by [facility] rheum, knee pain chronic 2/2 obesity, fibromyalgia and Sjogren's.   Primary Sjogren's syndrome (HCC)    anti Ro+;ANA>1:1280 in homogen pattern, negative ds DNA/RF/anti Smith/RNP/C3-4 comp/la/jo1/Scleroderma/centromere, neg HIV/ACE/Hep B/C, nl CXR 2/05, schirmer salivary glandtest not done, symptom rx:eye drops/prednisone /plaquenil  referral to [facility] rheum, Dr. Chauncy 3/07. saw [name] but stopped 2/2 cost.   S/P tooth extraction 12/22/2021   Subacute cough 08/15/2009   Qualifier: Diagnosis of   By: Joannie MD, Rosy       Uterine bleeding    Viral URI with cough 11/03/2017    Current Outpatient Medications on File Prior to Visit  Medication Sig Dispense Refill   acetaminophen  (TYLENOL ) 500 MG tablet Take 1,000 mg by mouth every 8 (eight) hours as needed for moderate pain (pain score 4-6).     albuterol  (VENTOLIN  HFA) 108 (90 Base) MCG/ACT inhaler  Inhale 1-2 puffs into the lungs every 6 (six) hours as needed for wheezing or shortness of breath. 6.7 g 0   amLODipine  (NORVASC ) 5 MG tablet Take 2 tablets (10 mg total) by mouth daily. 60 tablet 11   atorvastatin  (LIPITOR) 80 MG tablet Take 1 tablet (80 mg total) by mouth daily. 90 tablet 3   chlorthalidone  (HYGROTON ) 25 MG tablet Take 1 tablet (25 mg total) by mouth daily. 90 tablet 3   diclofenac  Sodium (VOLTAREN ) 1 % GEL Apply 4 g topically every 6 (six) hours as needed (arthritis pain). 100 g 0   furosemide  (LASIX ) 40 MG tablet Take 1 tablet (40 mg total) by mouth daily. 30 tablet 0   gabapentin  (NEURONTIN ) 300 MG capsule Take 1 capsule (300 mg total) by mouth at bedtime as needed. 30 capsule 11   HYDROcodone -acetaminophen  (NORCO/VICODIN) 5-325 MG tablet Take 1 tablet by mouth 2 (two) times daily as needed for severe pain (pain score 7-10). 30 tablet 0   levofloxacin  (LEVAQUIN ) 750 MG tablet Take 1 tablet (750 mg total) by mouth daily. 7 tablet 0   levothyroxine  (SYNTHROID ) 50 MCG tablet Take 1 tablet (50 mcg total) by mouth daily. 90 tablet 3   losartan  (COZAAR ) 100 MG tablet Take 1 tablet (100 mg total) by mouth daily. 90 tablet 3   metoprolol  succinate (TOPROL -XL) 50 MG 24 hr tablet Take 1 tablet (50 mg total) by mouth daily. Take with or immediately following a  meal. 90 tablet 3   No current facility-administered medications on file prior to visit.    Family History  Problem Relation Age of Onset   Hypertension Mother    Cancer Mother    Cancer Father    Cancer Sister    Thyroid  disease Sister    Ovarian cancer Other        family history   Breast cancer Other        family history in 1st degree relatives.   Colon cancer Neg Hx    Stomach cancer Neg Hx    Sleep apnea Neg Hx     Social History   Socioeconomic History   Marital status: Married    Spouse name: Not on file   Number of children: Not on file   Years of education: Not on file   Highest education level: Not  on file  Occupational History   Occupation: house wife    Employer: UNEMPLOYED  Tobacco Use   Smoking status: Never    Passive exposure: Current   Smokeless tobacco: Never  Vaping Use   Vaping status: Never Used  Substance and Sexual Activity   Alcohol use: No    Alcohol/week: 0.0 standard drinks of alcohol   Drug use: No   Sexual activity: Not on file  Other Topics Concern   Not on file  Social History Narrative   Her daughter Nancy [name]) often comes with her to visits. She also has a grand daughter and great grand daughter(whose name is Nancy [name])   Right handed   Lives with husband   Caffeine: 1-2 cups coffee every morning, maybe 2 teas in evening with dinner   Social Drivers of Health   Financial Resource Strain: Low Risk  (06/01/2022)   Overall Financial Resource Strain (CARDIA)    Difficulty of Paying Living Expenses: Not hard at all  Food Insecurity: No Food Insecurity (06/01/2022)   Hunger Vital Sign    Worried About Running Out of Food in the Last Year: Never true    Ran Out of Food in the Last Year: Never true  Transportation Needs: No Transportation Needs (06/01/2022)   PRAPARE - Administrator, Civil Service (Medical): No    Lack of Transportation (Non-Medical): No  Physical Activity: Inactive (06/01/2022)   Exercise Vital Sign    Days of Exercise per Week: 0 days    Minutes of Exercise per Session: 0 min  Stress: No Stress Concern Present (06/01/2022)   [employer] of Occupational Health - Occupational Stress Questionnaire    Feeling of Stress : Not at all  Social Connections: Unknown (06/01/2022)   Social Connection and Isolation Panel    Frequency of Communication with Friends and Family: More than three times a week    Frequency of Social Gatherings with Friends and Family: More than three times a week    Attends Religious Services: Patient declined    Database administrator or Organizations: No    Attends Banker Meetings: Never     Marital Status: Married  Catering manager Violence: Not At Risk (06/01/2022)   Humiliation, Afraid, Rape, and Kick questionnaire    Fear of Current or Ex-Partner: No    Emotionally Abused: No    Physically Abused: No    Sexually Abused: No    Review of Systems: ROS negative except for what is noted on the assessment and plan.  Vitals:   09/01/23 1431 09/01/23 1458  BP: (!) 179/92 (!) 157/90  Pulse: 72 76  Temp: 98.3 F (36.8 C)   TempSrc: Oral   SpO2: 95%   Weight: 272 lb (123.4 kg)   Height: 5' 3 (1.6 m)    Physical Exam: Constitutional: alert, sitting in wheelchair, in no acute distress Cardiovascular: regular rate and rhythm Pulmonary/Chest: normal work of breathing on room air MSK: LUE: 2+ radial pulse, TTP of palmar surface of left hand and scaphoid tenderness, grip strength limited due to pain, no swelling or significant joint swelling, no erythema, positive Spurling sign on L Neurological: alert & oriented x 3, CN II-XII intact, 5/5 strength in bilateral UE, decreased L grip strength due to pain, sensation intact bilaterally Skin: warm and dry  Assessment & Plan:   Left hand pain Reports at least 2 weeks of left hand pain and at times tingling and numbness. Describes as sharp pains of left palm that extends to fingers. Pain intermittent. No recent injury or trauma. She is R hand dominant.   Exam today showed TTP of palmar surface and around base of thumb. No significant swelling, erythema or obvious deformity. Grip strength limited due to pain and not weakness. No focal weakness of UE either. Takes gabapentin  300 mg at bedtime which helps.   Does have hx of RA and not on medication or currently following rheumatology. Will obtain xrays of bilateral hands. Prior xray in 07/2021 showed Mild appearing steoarthritis changes, possible proximal finger joint juxta-articular osteopenia could be consistent with chronic inflammatory arthritis but no erosive disease. Consider pain in  setting of uncontrolled RA and off her previous medication.   Plan -Repeat xray of hands  -Has voltaren  gel, gabapentin  and chronic Norco PRN  -Referral back to Rheumatology to re-initiate RA treatment   Essential hypertension Status: uncontrolled. BP today 157/90. Reports taking losartan  100 mg, chlorthalidone  25 mg, metoprolol  50 mg daily, amlodipine  to 10 mg daily and furosemide  40 mg daily. Last BMP in 05/2023 was normal electrolytes and renal fxn. Does not appear taking medications consistently. Dispense report does not suggest up to date dispense. Question if truly adherent with current BP regimen. Will have patient return for med rec and adherence.   Plan -Continue above BP regimen but patient to bring all her medications at next OV -Referral to [facility] for polypharmacy and medication adherence   Patient discussed with Dr. Francesco Ozell Nearing, D.O. [facility], PGY-2 Phone: [phone] Date 09/01/2023 Time 8:33 AM

## 2023-09-02 DIAGNOSIS — M79642 Pain in left hand: Secondary | ICD-10-CM | POA: Insufficient documentation

## 2023-09-02 NOTE — Assessment & Plan Note (Addendum)
 Reports at least 2 weeks of left hand pain and at times tingling and numbness. Describes as sharp pains of left palm that extends to fingers. Pain intermittent. No recent injury or trauma. She is R hand dominant.   Exam today showed TTP of palmar surface and around base of thumb. No significant swelling, erythema or obvious deformity. Grip strength limited due to pain and not weakness. No focal weakness of UE either. Takes gabapentin  300 mg at bedtime which helps.   Does have hx of RA and not on medication or currently following rheumatology. Will obtain xrays of bilateral hands. Prior xray in 07/2021 showed Mild appearing steoarthritis changes, possible proximal finger joint juxta-articular osteopenia could be consistent with chronic inflammatory arthritis but no erosive disease. Consider pain in setting of uncontrolled RA and off her previous medication.   Plan -Repeat xray of hands  -Has voltaren  gel, gabapentin  and chronic Norco PRN  -Referral back to Rheumatology to re-initiate RA treatment

## 2023-09-02 NOTE — Assessment & Plan Note (Addendum)
 Status: uncontrolled. BP today 157/90. Reports taking losartan  100 mg, chlorthalidone  25 mg, metoprolol  50 mg daily, amlodipine  to 10 mg daily and furosemide  40 mg daily. Last BMP in 05/2023 was normal electrolytes and renal fxn. Does not appear taking medications consistently. Dispense report does not suggest up to date dispense. Question if truly adherent with current BP regimen. Will have patient return for med rec and adherence.   Plan -Continue above BP regimen but patient to bring all her medications at next OV -Referral to [facility] for polypharmacy and medication adherence

## 2023-09-03 ENCOUNTER — Encounter: Payer: Self-pay | Admitting: Student

## 2023-09-03 NOTE — Progress Notes (Signed)
 Internal Medicine Clinic Attending  Case discussed with the resident at the time of the visit.  We reviewed the resident's history and exam and pertinent patient test results.  I agree with the assessment, diagnosis, and plan of care documented in the resident's note.

## 2023-09-06 ENCOUNTER — Other Ambulatory Visit: Payer: Self-pay | Admitting: Internal Medicine

## 2023-09-06 ENCOUNTER — Other Ambulatory Visit (HOSPITAL_COMMUNITY): Payer: Self-pay

## 2023-09-06 DIAGNOSIS — M25562 Pain in left knee: Secondary | ICD-10-CM

## 2023-09-07 ENCOUNTER — Other Ambulatory Visit (HOSPITAL_COMMUNITY): Payer: Self-pay

## 2023-09-07 MED ORDER — HYDROCODONE-ACETAMINOPHEN 5-325 MG PO TABS
1.0000 | ORAL_TABLET | Freq: Two times a day (BID) | ORAL | 0 refills | Status: DC | PRN
  Filled 2023-09-07: qty 30, 15d supply, fill #0

## 2023-09-07 MED ORDER — GABAPENTIN 300 MG PO CAPS
300.0000 mg | ORAL_CAPSULE | Freq: Every evening | ORAL | 11 refills | Status: AC | PRN
  Filled 2023-09-07: qty 30, 30d supply, fill #0
  Filled 2024-03-20: qty 30, 30d supply, fill #1

## 2023-09-09 ENCOUNTER — Telehealth: Payer: Self-pay | Admitting: *Deleted

## 2023-09-09 NOTE — Progress Notes (Signed)
 Care Guide Pharmacy Note  09/09/2023 Name: Nancy [name] MRN: [id number] DOB: [birth year]  Referred By: Karna Fellows, MD Reason for referral: Complex Care Management (Initial outreach to schedule referral with PharmD [place])   Nancy [name] is a 68 y.o. year old female who is a primary care patient of Karna Fellows, MD.  Nancy [name] was referred to the pharmacist for assistance related to: HTN  An unsuccessful telephone outreach was attempted today to contact the patient who was referred to the pharmacy team for assistance with medication management. Additional attempts will be made to contact the patient.  Harlene Satterfield  [facility]  [facility], [facility] Guide  Direct Dial: [phone]  Fax [phone]

## 2023-09-13 DIAGNOSIS — M159 Polyosteoarthritis, unspecified: Secondary | ICD-10-CM | POA: Diagnosis not present

## 2023-09-13 DIAGNOSIS — M2550 Pain in unspecified joint: Secondary | ICD-10-CM | POA: Diagnosis not present

## 2023-09-13 DIAGNOSIS — E661 Drug-induced obesity: Secondary | ICD-10-CM | POA: Diagnosis not present

## 2023-09-14 NOTE — Progress Notes (Signed)
 Care Guide Pharmacy Note  09/14/2023 Name: Nancy [name] MRN: [id number] DOB: 1955/12/02  Referred By: Karna Fellows, MD Reason for referral: Complex Care Management (Initial outreach to schedule referral with PharmD [place])   Nancy [name] is a 68 y.o. year old female who is a primary care patient of Karna Fellows, MD.  Nancy [name] was referred to the pharmacist for assistance related to: HTN  Successful contact was made with the patient to discuss pharmacy services including being ready for the pharmacist to call at least 5 minutes before the scheduled appointment time and to have medication bottles and any blood pressure readings ready for review. The patient agreed to meet with the pharmacist via telephone visit on (date/time).09/20/23 10:00 AM   Harlene [facility]  [facility], [facility] Guide  Direct Dial: [phone]  Fax [phone]

## 2023-09-20 ENCOUNTER — Other Ambulatory Visit: Payer: Self-pay | Admitting: Pharmacist

## 2023-09-20 NOTE — Progress Notes (Signed)
   09/20/2023 Name: Nancy [name] MRN: [id number] DOB: May 05, 1955  Chief Complaint  Patient presents with   Hypertension    Nancy [name] is a 68 y.o. year old female who presented for a telephone visit.   They were referred to the pharmacist by their PCP for assistance in managing hypertension.    Subjective:  Care Team: Primary Care Provider: Karna Fellows, MD ; Next Scheduled Visit: 10/12/23 Clinical Pharmacist: Aloysius Lewis, PharmD  Medication Access/Adherence  Current Pharmacy:  JOLYNN DAVENE JASMINE [facility] [address] [place] [address] Phone: [phone] Fax: [phone]  [id number] [address] ([place]), [place] - [address] [place] ([place]) [place] [address] Phone: [phone] Fax: [phone]  [id number] - [place], [place] - [address] AT [facility] [address] [place] [address] Phone: [phone] Fax: [phone]   Patient reports affordability concerns with their medications: No  Patient reports access/transportation concerns to their pharmacy: No  Patient reports adherence concerns with their medications:  Yes - not taking her medications most days   Hypertension:  Current medications: Amlodipine  5mg , Chlorthalidone  25mg , Losartan  100mg , Toprol  XL 50mg  daily Medications previously tried: Unknown  Patient does not have a validated, automated, upper arm home BP cuff- has a wrist BP cuff  Current blood pressure readings readings: Did not provide today  Patient denies hypotensive s/sx including dizziness, lightheadedness.  Patient denies hypertensive symptoms including headache, chest pain, shortness of breath    Objective:  Lab Results  Component Value Date   HGBA1C 6.3 (H) 02/09/2023    Lab Results  Component Value Date   CREATININE 0.92 05/29/2023   BUN 11 05/29/2023   NA 136 05/29/2023   K 3.6 05/29/2023    CL 100 05/29/2023   CO2 24 05/29/2023    Lab Results  Component Value Date   CHOL 154 08/24/2022   HDL 56 08/24/2022   LDLCALC 78 08/24/2022   TRIG 113 08/24/2022   CHOLHDL 2.8 08/24/2022    Medications Reviewed Today   Medications were not reviewed in this encounter       Assessment/Plan:   Hypertension: - Currently uncontrolled - Reviewed long term cardiovascular and renal outcomes of uncontrolled blood pressure - Reviewed appropriate blood pressure monitoring technique and reviewed goal blood pressure. Recommended to check home blood pressure and heart rate daily - Recommend to take medications DAILY as prescribed  - BP readings at last two visits were: 157/90, 174/81    Follow Up Plan:  - No follow-up needed - Medication review completed during call today - Patient confirms not taking her medications most days- no issues with side effects, just doesn't want to take them - Asked about getting a BP reading while on the phone and declined due to having arthritis pain and not wanting to get up - Discussed importance of BP control over-time and taking medications as prescribed - Reports having Rheumatology call to schedule, but plans to call back when child comes over to schedule an appointment time    Aloysius Lewis, PharmD [facility]  Phone Number: [phone]

## 2023-09-27 NOTE — Progress Notes (Deleted)
 Office Visit Note  Patient: Nancy [name]             Date of Birth: August 15, 1955           MRN: [id number]             PCP: Karna Fellows, MD Referring: Karna Fellows, MD Visit Date: 10/04/2023   Subjective:  No chief complaint on file.   History of Present Illness: Nancy [name] is a 68 y.o. female here for follow up for evaluation of joint pain in multiple areas and abnormal labs concerning for inflammatory arthritis    Previous HPI 01/12/2022 Nancy [name] is a 68 y.o. female here for follow up for evaluation of joint pain in multiple areas and abnormal labs concerning for inflammatory arthritis currently with ongoing bilateral hand and knee pain issues are the worst. Lab results at initial visit did show persistent sedimentation rate elevation and suspicious for inflammatory pain component. Left knee was injected with Dr. Vernetta after last visit with temporary improvement. She is currently in wheelchair while out and about, also using walker for support at home and feels knee is giving out or locking up occasionally.   Previous HPI 07/10/2021  Nancy [name] is a 68 y.o. female here for evaluation of joint pain in multiple areas and abnormal labs concerning for inflammatory arthritis. She apparently had previous rheumatology assessment with [facility] but not on long term DMARD treatment. Bilateral hand pain affecting her knuckles she sees swelling of MCP joints or PIP joints intermittently. On other days not much morning stiffness and pain. She has ongoing knee pain worst on the left side. She saw Dr. Vernetta with intraarticular steroid injection a week ago and she had improvement but still a lot of active joint pain. Mobility is limited using a walker or more often wheelchair due to pain in her knees while weightbearing. She takes tylenol  or occasionally hydrocodone  for pain.    Labs reviewed ANA neg SSA >8.0 SSB neg RF 75.1 CCP >250 ESR 38   No Rheumatology ROS completed.    PMFS History:  Patient Active Problem List   Diagnosis Date Noted   Left hand pain 09/02/2023   Multifocal pneumonia 06/01/2023   (HFpEF) heart failure with preserved ejection fraction (HCC) 02/09/2023   Aortic atherosclerosis (HCC) 08/24/2022   Impaired mobility 04/17/2022   Cervical radiculopathy 04/17/2022   Alpha thalassaemia minor 04/06/2022   Bilateral hand pain 07/10/2021   Rheumatoid factor positive 07/10/2021   Hyperlipidemia 04/28/2021   Mixed stress and urge incontinence 12/16/2020   Asymmetrical sensorineural hearing loss 08/09/2019   Polyarthralgia 12/02/2016   GERD (gastroesophageal reflux disease) 04/13/2014   Allergic rhinitis 01/24/2014   Morbid obesity with body mass index (BMI) of 45.0 to 49.9 in adult [facility]) 09/14/2013   Abnormal CT of brain 11/19/2011   MACULAR DEGENERATION, BILATERAL 05/22/2008   Essential hypertension 03/17/2006   SICCA SYNDROME 03/17/2006   KNEE PAIN, CHRONIC 03/17/2006   Chronic low back pain 03/17/2006   Obstructive sleep apnea 09/07/2003   Hypothyroidism 09/07/1998   Microcytic anemia 09/07/1998    Past Medical History:  Diagnosis Date   Acute cholecystitis 01/21/2021   AKI (acute kidney injury) (HCC) 12/04/2018   Back pain    chronic low back pain L4-5 discectomy:11/99. degenerative thoracic spondylotic changes 4/07   Chest pain    neg adenosine myoview.   Healthcare maintenance 12/18/2014   Hypertension    no LVH EKG-7/05, nl M/C ratio 4/07  Hypothyroidism    09/1998   Lower extremity edema    echo EF 55-65% w/o evidence of Dias dysfx.,    Menorrhagia    Microcytic anemia    09/1998. Hb-10.5/MCV 76. needs ferritin to determine ACD vs IDA   Morbid obesity (HCC)    Obstructive sleep apnea    severe 7/05 RD 161 per hr. /CPAP 18cwp   Plantar fasciitis of left foot 12/16/2020   Pneumonia due to COVID-19 virus 12/04/2018   Polyarthralgia    (knee/back/ankle) w/dx of fibromyalgia? given by [facility] rheum, knee pain chronic  2/2 obesity, fibromyalgia and Sjogren's.   Primary Sjogren's syndrome (HCC)    anti Ro+;ANA>1:1280 in homogen pattern, negative ds DNA/RF/anti Smith/RNP/C3-4 comp/la/jo1/Scleroderma/centromere, neg HIV/ACE/Hep B/C, nl CXR 2/05, schirmer salivary glandtest not done, symptom rx:eye drops/prednisone /plaquenil  referral to [facility] rheum, Dr. Chauncy 3/07. saw [name] but stopped 2/2 cost.   S/P tooth extraction 12/22/2021   Subacute cough 08/15/2009   Qualifier: Diagnosis of   By: Joannie MD, Rosy       Uterine bleeding    Viral URI with cough 11/03/2017    Family History  Problem Relation Age of Onset   Hypertension Mother    Cancer Mother    Cancer Father    Cancer Sister    Thyroid  disease Sister    Ovarian cancer Other        family history   Breast cancer Other        family history in 1st degree relatives.   Colon cancer Neg Hx    Stomach cancer Neg Hx    Sleep apnea Neg Hx    Past Surgical History:  Procedure Laterality Date   CHOLECYSTECTOMY N/A 01/23/2021   Procedure: LAPAROSCOPIC CHOLECYSTECTOMY;  Surgeon: Sebastian Moles, MD;  Location: [facility] OR;  Service: General;  Laterality: N/A;   EXAMINATION UNDER ANESTHESIA  11/23/2011   Procedure: EXAM UNDER ANESTHESIA;  Surgeon: Gloris DELENA Hugger, MD;  Location: [facility] ORS;  Service: Gynecology;  Laterality: N/A;   HYSTEROSCOPY WITH D & C  11/23/2011   Procedure: DILATATION AND CURETTAGE /HYSTEROSCOPY;  Surgeon: Gloris DELENA Hugger, MD;  Location: [facility] OR[facility];  Service: Gynecology;  Laterality: N/A;   INJECTION KNEE Left 2025   steroid   LUMBAR FUSION  03/09/1997   SHOULDER SURGERY  unk   TONSILLECTOMY     TUBAL LIGATION  03/10/1975   WISDOM TOOTH EXTRACTION     Social History   Social History Narrative   Her daughter dwan) often comes with her to visits. She also has a grand daughter and great grand daughter(whose name is Avel)   Right handed   Lives with husband   Caffeine: 1-2 cups coffee every morning, maybe 2  teas in evening with dinner   Immunization History  Administered Date(s) Administered   Fluad Quad(high Dose 65+) 12/08/2018, 12/16/2020   Moderna Covid-19 Vaccine Bivalent Booster 73yrs & up 12/28/2020   PFIZER(Purple Top)SARS-COV-2 Vaccination 05/18/2019, 06/12/2019, 12/16/2019   PNEUMOCOCCAL CONJUGATE-20 12/16/2020   Pneumococcal Polysaccharide-23 12/08/2018   Td 04/29/2009, 09/13/2009   Unspecified SARS-COV-2 Vaccination 12/07/2021   Zoster Recombinant(Shingrix) 01/10/2022, 02/08/2022     Objective: Vital Signs: There were no vitals taken for this visit.   Physical Exam   Musculoskeletal Exam: ***  CDAI Exam: CDAI Score: -- Patient Global: --; Provider Global: -- Swollen: --; Tender: -- Joint Exam 10/04/2023   No joint exam has been documented for this visit   There is currently no information documented on the homunculus. Go to the  Rheumatology activity and complete the homunculus joint exam.  Investigation: No additional findings.  Imaging: No results found.  Recent Labs: Lab Results  Component Value Date   WBC 5.7 05/29/2023   HGB 11.5 (L) 05/29/2023   PLT 290 05/29/2023   NA 136 05/29/2023   K 3.6 05/29/2023   CL 100 05/29/2023   CO2 24 05/29/2023   GLUCOSE 108 (H) 05/29/2023   BUN 11 05/29/2023   CREATININE 0.92 05/29/2023   BILITOT 0.6 02/09/2023   ALKPHOS 61 02/09/2023   AST 13 (L) 02/09/2023   ALT 9 02/09/2023   PROT 7.5 02/09/2023   ALBUMIN 3.4 (L) 02/09/2023   CALCIUM  9.0 05/29/2023   GFRAA >60 09/06/2019    Speciality Comments: No specialty comments available.  Procedures:  No procedures performed Allergies: Amlodipine , Coconut fatty acid, and Naproxen sodium   Assessment / Plan:     Visit Diagnoses: No diagnosis found.  ***  Orders: No orders of the defined types were placed in this encounter.  No orders of the defined types were placed in this encounter.    Follow-Up Instructions: No follow-ups on file.   Shelba SHAUNNA Potters, RT  Note - This record has been created using [employer].  Chart creation errors have been sought, but may not always  have been located. Such creation errors do not reflect on  the standard of medical care.

## 2023-10-04 ENCOUNTER — Ambulatory Visit: Payer: Self-pay | Admitting: Internal Medicine

## 2023-10-12 ENCOUNTER — Other Ambulatory Visit (HOSPITAL_COMMUNITY): Payer: Self-pay

## 2023-10-12 ENCOUNTER — Telehealth: Payer: Self-pay

## 2023-10-12 ENCOUNTER — Ambulatory Visit: Admitting: Internal Medicine

## 2023-10-12 ENCOUNTER — Telehealth: Payer: Self-pay | Admitting: *Deleted

## 2023-10-12 ENCOUNTER — Encounter: Payer: Self-pay | Admitting: Internal Medicine

## 2023-10-12 VITALS — BP 160/93 | HR 63 | Temp 97.9°F | Ht 63.0 in | Wt 272.0 lb

## 2023-10-12 DIAGNOSIS — I10 Essential (primary) hypertension: Secondary | ICD-10-CM | POA: Diagnosis not present

## 2023-10-12 DIAGNOSIS — E785 Hyperlipidemia, unspecified: Secondary | ICD-10-CM | POA: Diagnosis not present

## 2023-10-12 DIAGNOSIS — M545 Low back pain, unspecified: Secondary | ICD-10-CM | POA: Diagnosis not present

## 2023-10-12 DIAGNOSIS — G4733 Obstructive sleep apnea (adult) (pediatric): Secondary | ICD-10-CM

## 2023-10-12 DIAGNOSIS — Z6841 Body Mass Index (BMI) 40.0 and over, adult: Secondary | ICD-10-CM | POA: Diagnosis not present

## 2023-10-12 DIAGNOSIS — G8929 Other chronic pain: Secondary | ICD-10-CM

## 2023-10-12 DIAGNOSIS — M2550 Pain in unspecified joint: Secondary | ICD-10-CM

## 2023-10-12 MED ORDER — LIDOCAINE 5 % EX PTCH
1.0000 | MEDICATED_PATCH | CUTANEOUS | 0 refills | Status: AC
  Filled 2023-10-12 – 2023-10-25 (×2): qty 10, 10d supply, fill #0
  Filled 2023-10-25: qty 5, 5d supply, fill #0
  Filled 2023-10-25: qty 10, 10d supply, fill #0

## 2023-10-12 MED ORDER — TIRZEPATIDE-WEIGHT MANAGEMENT 2.5 MG/0.5ML ~~LOC~~ SOAJ
2.5000 mg | SUBCUTANEOUS | 1 refills | Status: AC
  Filled 2023-10-12 – 2024-03-20 (×2): qty 2, 28d supply, fill #0

## 2023-10-12 NOTE — Telephone Encounter (Signed)
 Nancy [name] (KeyBETHA SNELLEN) Rx #: [id number] Zepbound  2.5MG /0.5ML pen-injectors Form [facility] [employer] Electronic Prior Authorization Form 2017 NCPDP Created  Sent to Plan Plan Response Submit Clinical Questions Determination Unfavorable eAppeal Submitted eAppeal Determination Your prior authorization request has been denied. Complete E-Appeal Your request for prior authorization was denied, but an Electronic Appeal is available for your patient. Complete the questions in the Appeal section at the bottom of this page to pursue the appeal. For assistance, contact our support team at [phone].   Per Lavern she would have to fail at least one of these before they consider coverage: Mounjaro , Ozempic , Byetta, Trulicity, or Rybelsus . Part D isn't going to cover weight loss injectables.

## 2023-10-12 NOTE — Progress Notes (Signed)
 Established Patient Office Visit  Subjective   Patient ID: Nancy [name], female    DOB: Nov 16, 1955  Age: 68 y.o. MRN: [id number]  Chief Complaint  Patient presents with   Follow-up    Achy all over.    [name] returns to clinic today for f/u of chronic medical conditions. Please see assessment/plan in problem-based charting for further details of today's visit.    Patient Active Problem List   Diagnosis Date Noted   Left hand pain 09/02/2023   Multifocal pneumonia 06/01/2023   (HFpEF) heart failure with preserved ejection fraction (HCC) 02/09/2023   Aortic atherosclerosis (HCC) 08/24/2022   Impaired mobility 04/17/2022   Cervical radiculopathy 04/17/2022   Alpha thalassaemia minor 04/06/2022   Bilateral hand pain 07/10/2021   Rheumatoid factor positive 07/10/2021   Hyperlipidemia 04/28/2021   Mixed stress and urge incontinence 12/16/2020   Asymmetrical sensorineural hearing loss 08/09/2019   Polyarthralgia 12/02/2016   GERD (gastroesophageal reflux disease) 04/13/2014   Allergic rhinitis 01/24/2014   Morbid obesity with body mass index (BMI) of 45.0 to 49.9 in adult [facility]) 09/14/2013   Abnormal CT of brain 11/19/2011   MACULAR DEGENERATION, BILATERAL 05/22/2008   Essential hypertension 03/17/2006   SICCA SYNDROME 03/17/2006   KNEE PAIN, CHRONIC 03/17/2006   Chronic low back pain 03/17/2006   Obstructive sleep apnea 09/07/2003   Hypothyroidism 09/07/1998   Microcytic anemia 09/07/1998      Objective:     BP (!) 160/93 (BP Location: Right Arm, Patient Position: Sitting, Cuff Size: Large)   Pulse 63   Temp 97.9 F (36.6 C) (Oral)   Ht 5' 3 (1.6 m)   Wt 272 lb (123.4 kg)   SpO2 96% Comment: RA  BMI 48.18 kg/m  BP Readings from Last 3 Encounters:  10/12/23 (!) 160/93  09/01/23 (!) 157/90  07/14/23 (!) 174/81   Wt Readings from Last 3 Encounters:  10/12/23 272 lb (123.4 kg)  09/01/23 272 lb (123.4 kg)  07/14/23 278 lb (126.1 kg)     Physical  Exam Constitutional:      General: She is not in acute distress.    Appearance: Normal appearance. She is obese. She is not ill-appearing.  Pulmonary:     Effort: Pulmonary effort is normal.  Musculoskeletal:     Comments: Swollen, non-tender MCP of bilateral hands  Neurological:     General: No focal deficit present.     Mental Status: She is alert.  Psychiatric:        Mood and Affect: Mood normal.        Behavior: Behavior normal.      Assessment & Plan:   Problem List Items Addressed This Visit       Cardiovascular and Mediastinum   Essential hypertension   Above goal today. [name] had an appointment with [facility] pharmacist 7/14. Noted medication non-adherence. We discussed this today and [name] confirms she is only taking all chronic, daily medications about 2-3x/week. She cites not liking to take medication as the reason. We reviewed risks of chronically uncontrolled blood pressure, including stroke, heart disease, and kidney disease, and the need for regular adherence. She acknowledged these risks and agreed to try taking all medications daily, although does not seem entirely convinced that she wants to do so. She reports home systolic pressures in the 140s when not in pain which we discussed is still above goal. We will f/u in 4-6 weeks with BP recheck.  Plan -Adherence to losartan  100 mg daily,  metoprolol  XL 50 mg daily, amlodipine  10 mg daily, chlorthalidone  25 mg daily, furosemide  40 mg daily -Fu in 4-6 weeks for BP recheck -BMP      Relevant Orders   Basic metabolic panel with GFR     Respiratory   Obstructive sleep apnea   History of severe sleep apnea. Repeat sleep study for CPAP titration and qualification for new machine completed 08/2023. Confirmed severe OSA. Neurology sent order for new machine, working with home health company. I have discussed CPAP adherence with [name] today. Also recommended Zepbound  for OSA and weight loss. She is agreeable to try  this medication. Administration, side effects, and potential for future titration reviewed today.   Plan -CPAP per [employer] -Start Zepbound  2.5 mg weekly      Relevant Medications   tirzepatide  (ZEPBOUND ) 2.5 MG/0.5ML Pen     Other   Chronic low back pain   Chronic and stable low back pain. Using gabapentin  and hydrocodone  prn. Advised limiting hydrocodone  use as able, currently using less than weekly. She has found benefit from lidocaine  patches and will send prescription today.  Plan -Lidocaine  5% patch prescribed -Continue gabapentin  300 mg at bedtime as needed -Continue hydrocodone -acetaminophen  bid prn per pain contract, [employer]ox[employer]ure today      Relevant Medications   lidocaine  (LIDODERM ) 5 %   Other Relevant Orders   ToxAssure Select,+Antidepr,UR   Morbid obesity with body mass index (BMI) of 45.0 to 49.9 in adult (HCC)   Weight stable, BMI 48. Discussed Zepbound  for OSA and obesity. Reviewed potential future titration, side effects, and administration.   Plan -Start Zepbound  2.5 mg weekly      Relevant Medications   tirzepatide  (ZEPBOUND ) 2.5 MG/0.5ML Pen   Polyarthralgia   Continued chronic pain in hands and knees, likely related to previously diagnosed inflammatory arthritis. She has scheduled a f/u appointment with rheumatology, Dr. Jeannetta, at the end of this month. Encouraged her to keep this appointment for further management.      Relevant Orders   ToxAssure Select,+Antidepr,UR   Hyperlipidemia - Primary   Intermittently adherent to statin. 2-3x/week. Stressed the importance of adherence today.  Plan -lipid panel -atorvastatin  80 mg daily      Relevant Orders   Lipid panel    Return in about 4 weeks (around 11/09/2023) for f/u BP.    [name], MD

## 2023-10-12 NOTE — Patient Instructions (Addendum)
 It was wonderful to see you today!  It is very important for you to take your medications every day! Please take all medications daily for the next month and we'll see you back to follow-up on your blood pressure.  Start taking tirzepatide  (Zepbound ) injection once weekly for weight loss and sleep apnea.  Make sure to go to your appointment with Dr. Jeannetta on August 26!

## 2023-10-12 NOTE — Assessment & Plan Note (Addendum)
 Chronic and stable low back pain. Using gabapentin  and hydrocodone  prn. Advised limiting hydrocodone  use as able, currently using less than weekly. She has found benefit from lidocaine  patches and will send prescription today.  Plan -Lidocaine  5% patch prescribed -Continue gabapentin  300 mg at bedtime as needed -Continue hydrocodone -acetaminophen  bid prn per pain contract, [employer] today

## 2023-10-12 NOTE — Telephone Encounter (Signed)
-----   Message from True Mar sent at 10/12/2023 10:23 AM EDT ----- Regarding: RE: CPAP orderes 08/2023 Hello: Thanks for reaching out. She can call her DME directly too. I will copy my nurse team on this.  Dr. Mar ----- Message ----- From: Karna Fellows, MD Sent: 10/12/2023  10:09 AM EDT To: True Mar, MD Subject: CPAP orderes 08/2023                            Good morning Dr. Mar,  I see Ms. Petrasek for primary care in internal medicine. She informed me this morning that she has not heard anything about a new/updated CPAP from the home health company you sent the orders to in June. Is your team able to help Ms. Coor with this?   Thank you, Fellows Karna, MD

## 2023-10-12 NOTE — Telephone Encounter (Signed)
 Prior Authorization for patient (Zepbound  2.5MG /0.5ML pen-injectors) came through on cover my meds was submitted with last office notes and sleep study awaiting approval or denial.  [id number]

## 2023-10-12 NOTE — Telephone Encounter (Signed)
 Forward message to [employer] this am to make them aware patient hasn't heard about cpap set up

## 2023-10-12 NOTE — Assessment & Plan Note (Signed)
 Continued chronic pain in hands and knees, likely related to previously diagnosed inflammatory arthritis. She has scheduled a f/u appointment with rheumatology, Dr. Jeannetta, at the end of this month. Encouraged her to keep this appointment for further management.

## 2023-10-12 NOTE — Assessment & Plan Note (Signed)
 History of severe sleep apnea. Repeat sleep study for CPAP titration and qualification for new machine completed 08/2023. Confirmed severe OSA. Neurology sent order for new machine, working with home health company. I have discussed CPAP adherence with Nancy [name] today. Also recommended Zepbound  for OSA and weight loss. She is agreeable to try this medication. Administration, side effects, and potential for future titration reviewed today.   Plan -CPAP per [employer] -Start Zepbound  2.5 mg weekly

## 2023-10-12 NOTE — Assessment & Plan Note (Signed)
 Above goal today. Nancy [name] had an appointment with [facility] pharmacist 7/14. Noted medication non-adherence. We discussed this today and Nancy [name] confirms she is only taking all chronic, daily medications about 2-3x/week. She cites not liking to take medication as the reason. We reviewed risks of chronically uncontrolled blood pressure, including stroke, heart disease, and kidney disease, and the need for regular adherence. She acknowledged these risks and agreed to try taking all medications daily, although does not seem entirely convinced that she wants to do so. She reports home systolic pressures in the 140s when not in pain which we discussed is still above goal. We will f/u in 4-6 weeks with BP recheck.  Plan -Adherence to losartan  100 mg daily, metoprolol  XL 50 mg daily, amlodipine  10 mg daily, chlorthalidone  25 mg daily, furosemide  40 mg daily -Fu in 4-6 weeks for BP recheck -BMP

## 2023-10-12 NOTE — Assessment & Plan Note (Signed)
 Weight stable, BMI 48. Discussed Zepbound  for OSA and obesity. Reviewed potential future titration, side effects, and administration.   Plan -Start Zepbound  2.5 mg weekly

## 2023-10-12 NOTE — Assessment & Plan Note (Signed)
 Intermittently adherent to statin. 2-3x/week. Stressed the importance of adherence today.  Plan -lipid panel -atorvastatin  80 mg daily

## 2023-10-13 LAB — BASIC METABOLIC PANEL WITH GFR
BUN/Creatinine Ratio: 11 — ABNORMAL LOW (ref 12–28)
BUN: 10 mg/dL (ref 8–27)
CO2: 28 mmol/L (ref 20–29)
Calcium: 8.8 mg/dL (ref 8.7–10.3)
Chloride: 98 mmol/L (ref 96–106)
Creatinine, Ser: 0.94 mg/dL (ref 0.57–1.00)
Glucose: 96 mg/dL (ref 70–99)
Potassium: 4.2 mmol/L (ref 3.5–5.2)
Sodium: 138 mmol/L (ref 134–144)
eGFR: 65 mL/min/{1.73_m2}

## 2023-10-13 LAB — LIPID PANEL
Chol/HDL Ratio: 3.1 ratio (ref 0.0–4.4)
Cholesterol, Total: 173 mg/dL (ref 100–199)
HDL: 56 mg/dL
LDL Chol Calc (NIH): 96 mg/dL (ref 0–99)
Triglycerides: 116 mg/dL (ref 0–149)
VLDL Cholesterol Cal: 21 mg/dL (ref 5–40)

## 2023-10-13 NOTE — Telephone Encounter (Signed)
 Spoke to patient made her aware spoke to rep from [employer] has placed her order on stat to process faster . Pt expressed understanding and thanked me for calling

## 2023-10-13 NOTE — Telephone Encounter (Addendum)
 Hey Dr.Lau we can appeal. Are you able to attach a note to this encounter explaining why you think this request should be appealed? I can fax your note to the patients insurance.

## 2023-10-14 ENCOUNTER — Ambulatory Visit: Payer: Self-pay | Admitting: Internal Medicine

## 2023-10-14 DIAGNOSIS — M159 Polyosteoarthritis, unspecified: Secondary | ICD-10-CM | POA: Diagnosis not present

## 2023-10-14 DIAGNOSIS — E661 Drug-induced obesity: Secondary | ICD-10-CM | POA: Diagnosis not present

## 2023-10-14 DIAGNOSIS — M2550 Pain in unspecified joint: Secondary | ICD-10-CM | POA: Diagnosis not present

## 2023-10-14 LAB — MED LIST ATTACHED SEPARATELY

## 2023-10-14 NOTE — Progress Notes (Signed)
 LDL above goal for primary prevention. Nancy [name] has not been taking her medication daily as prescribed, encouraged to adhere to daily regimen and will recheck at future visit. ASCVD risk 15.9%.

## 2023-10-14 NOTE — Progress Notes (Signed)
 Electrolytes and kidney function wnl. Not adherent to daily medication for BP. Discussed regular adherence at recent visit.

## 2023-10-14 NOTE — Progress Notes (Signed)
 Hydrocodone  not present in patient's urine. This is not unexpected given she reported infrequent use and no recent use at our last visit. We discussed limiting use as able.

## 2023-10-14 NOTE — Telephone Encounter (Signed)
 Nancy [name] is an adult with a BMI of 48 and a diagnosis of severe obstructive sleep apnea, confirmed by a sleep study with an AHI of 77.8. The patient has a history of unsuccessful weight loss efforts with behavioral efforts and pharmacologic therapy and has demonstrated poor tolerance to CPAP therapy, as described in her last visit with sleep provider. Therefore, we are requesting coverage for Zepbound , as it is the most appropriate and necessary treatment option to manage the patient's sleep apnea, improve their symptoms, and reduce their risk of complications.

## 2023-10-14 NOTE — Telephone Encounter (Signed)
 Appeal has been placed in the box to be faxed to the patients insurance by [facility]. Appeal decision, last office notes and home sleep study results have been attached.  Fax:[phone]

## 2023-10-15 LAB — TOXASSURE SELECT,+ANTIDEPR,UR

## 2023-10-15 NOTE — Telephone Encounter (Signed)
 Additional denial information has been placed in your box.

## 2023-10-19 NOTE — Progress Notes (Signed)
 Office Visit Note  Patient: Nancy [name]             Date of Birth: 1955-09-18           MRN: [id number]             PCP: Karna Fellows, MD Referring: Karna Fellows, MD Visit Date: 11/02/2023   Subjective:  Pain of the Left Hand   History of Present Illness:   Discussed the use of AI scribe software for clinical note transcription with the patient, who gave verbal consent to proceed.  History of Present Illness   Nancy [name] is a 68 y.o. female here for follow up for evaluation of joint pain in multiple areas and abnormal labs concerning for inflammatory arthritis currently with ongoing bilateral hand and knee pain issues are the worst.  We have not seen her for 2023 at the time had been concerned for new diagnosis of seropositive rheumatoid arthritis or possibly Sjogren syndrome with related arthropathy but never followed up on starting any long-term medications for this.  She has been experiencing increased joint pain and swelling, particularly in her left hand, for a couple of weeks. She describes difficulty in grabbing objects, with her hand cramping, leading to dropping things. No similar symptoms in her feet or knees.  She has been using a lidocaine  patch on her hand, which provides occasional relief. Today, she is experiencing increased difficulty in using her hand.  She received knee injections a couple of months ago, which have alleviated her knee issues. She recalls seeing a specialist at [facility] for these injections.   Previous HPI 01/12/2022 Nancy [name] is a 68 y.o. female here for follow up for evaluation of joint pain in multiple areas and abnormal labs concerning for inflammatory arthritis currently with ongoing bilateral hand and knee pain issues are the worst. Lab results at initial visit did show persistent sedimentation rate elevation and suspicious for inflammatory pain component. Left knee was injected with Dr. Vernetta after last visit with  temporary improvement. She is currently in wheelchair while out and about, also using walker for support at home and feels knee is giving out or locking up occasionally.   Previous HPI 07/10/2021  Nancy [name] is a 68 y.o. female here for evaluation of joint pain in multiple areas and abnormal labs concerning for inflammatory arthritis. She apparently had previous rheumatology assessment with [facility] but not on long term DMARD treatment. Bilateral hand pain affecting her knuckles she sees swelling of MCP joints or PIP joints intermittently. On other days not much morning stiffness and pain. She has ongoing knee pain worst on the left side. She saw Dr. Vernetta with intraarticular steroid injection a week ago and she had improvement but still a lot of active joint pain. Mobility is limited using a walker or more often wheelchair due to pain in her knees while weightbearing. She takes tylenol  or occasionally hydrocodone  for pain.    Labs reviewed ANA neg SSA >8.0 SSB neg RF 75.1 CCP >250 ESR 38   Activities of Daily Living:  Patient reports morning stiffness for several hours.   Patient Reports nocturnal pain.  Difficulty dressing/grooming: Reports Difficulty climbing stairs: Reports Difficulty getting out of chair: Reports Difficulty using hands for taps, buttons, cutlery, and/or writing: Reports    Review of Systems  Constitutional:  Negative for fatigue.  HENT:  Negative for mouth sores and mouth dryness.   Eyes:  Negative for dryness.  Respiratory:  Positive for shortness of breath.   Cardiovascular:  Positive for chest pain. Negative for palpitations.  Gastrointestinal:  Negative for blood in stool, constipation and diarrhea.  Endocrine: Positive for increased urination.  Genitourinary:  Positive for involuntary urination.  Musculoskeletal:  Positive for joint pain, joint pain, joint swelling, myalgias, muscle weakness, morning stiffness, muscle tenderness and myalgias. Negative  for gait problem.  Skin:  Negative for color change, rash, hair loss and sensitivity to sunlight.  Allergic/Immunologic: Positive for susceptible to infections.  Neurological:  Positive for headaches. Negative for dizziness.  Hematological:  Negative for swollen glands.  Psychiatric/Behavioral:  Positive for sleep disturbance. Negative for depressed mood. The patient is nervous/anxious.     PMFS History:  Patient Active Problem List   Diagnosis Date Noted   Left hand pain 09/02/2023   Multifocal pneumonia 06/01/2023   (HFpEF) heart failure with preserved ejection fraction (HCC) 02/09/2023   Aortic atherosclerosis (HCC) 08/24/2022   Impaired mobility 04/17/2022   Cervical radiculopathy 04/17/2022   Alpha thalassaemia minor 04/06/2022   Bilateral hand pain 07/10/2021   Rheumatoid factor positive 07/10/2021   Hyperlipidemia 04/28/2021   Mixed stress and urge incontinence 12/16/2020   Asymmetrical sensorineural hearing loss 08/09/2019   Polyarthralgia 12/02/2016   GERD (gastroesophageal reflux disease) 04/13/2014   Allergic rhinitis 01/24/2014   Morbid obesity with body mass index (BMI) of 45.0 to 49.9 in adult [facility]) 09/14/2013   Abnormal CT of brain 11/19/2011   MACULAR DEGENERATION, BILATERAL 05/22/2008   Essential hypertension 03/17/2006   SICCA SYNDROME 03/17/2006   KNEE PAIN, CHRONIC 03/17/2006   Chronic low back pain 03/17/2006   Obstructive sleep apnea 09/07/2003   Hypothyroidism 09/07/1998   Microcytic anemia 09/07/1998    Past Medical History:  Diagnosis Date   Acute cholecystitis 01/21/2021   AKI (acute kidney injury) (HCC) 12/04/2018   Back pain    chronic low back pain L4-5 discectomy:11/99. degenerative thoracic spondylotic changes 4/07   Chest pain    neg adenosine myoview.   Healthcare maintenance 12/18/2014   Hypertension    no LVH EKG-7/05, nl M/C ratio 4/07   Hypothyroidism    09/1998   Lower extremity edema    echo EF 55-65% w/o evidence of Dias  dysfx.,    Menorrhagia    Microcytic anemia    09/1998. Hb-10.5/MCV 76. needs ferritin to determine ACD vs IDA   Morbid obesity (HCC)    Obstructive sleep apnea    severe 7/05 RD 161 per hr. /CPAP 18cwp   Plantar fasciitis of left foot 12/16/2020   Pneumonia due to COVID-19 virus 12/04/2018   Polyarthralgia    (knee/back/ankle) w/dx of fibromyalgia? given by [facility] rheum, knee pain chronic 2/2 obesity, fibromyalgia and Sjogren's.   Primary Sjogren's syndrome (HCC)    anti Ro+;ANA>1:1280 in homogen pattern, negative ds DNA/RF/anti Smith/RNP/C3-4 comp/la/jo1/Scleroderma/centromere, neg HIV/ACE/Hep B/C, nl CXR 2/05, schirmer salivary glandtest not done, symptom rx:eye drops/prednisone /plaquenil  referral to [facility] rheum, Dr. Chauncy 3/07. saw [name] but stopped 2/2 cost.   S/P tooth extraction 12/22/2021   Subacute cough 08/15/2009   Qualifier: Diagnosis of   By: Joannie MD, Rosy       Uterine bleeding    Viral URI with cough 11/03/2017    Family History  Problem Relation Age of Onset   Hypertension Mother    Cancer Mother    Cancer Father    Cancer Sister    Thyroid  disease Sister    Ovarian cancer Other  family history   Breast cancer Other        family history in 1st degree relatives.   Colon cancer Neg Hx    Stomach cancer Neg Hx    Sleep apnea Neg Hx    Past Surgical History:  Procedure Laterality Date   CHOLECYSTECTOMY N/A 01/23/2021   Procedure: LAPAROSCOPIC CHOLECYSTECTOMY;  Surgeon: Sebastian Moles, MD;  Location: [facility] OR;  Service: General;  Laterality: N/A;   EXAMINATION UNDER ANESTHESIA  11/23/2011   Procedure: EXAM UNDER ANESTHESIA;  Surgeon: Gloris DELENA Hugger, MD;  Location: [facility] ORS;  Service: Gynecology;  Laterality: N/A;   HYSTEROSCOPY WITH D & C  11/23/2011   Procedure: DILATATION AND CURETTAGE /HYSTEROSCOPY;  Surgeon: Gloris DELENA Hugger, MD;  Location: [facility] ORS;  Service: Gynecology;  Laterality: N/A;   INJECTION KNEE Left 2025   steroid    LUMBAR FUSION  03/09/1997   SHOULDER SURGERY  unk   TONSILLECTOMY     TUBAL LIGATION  03/10/1975   WISDOM TOOTH EXTRACTION     Social History   Social History Narrative   Her daughter dwan) often comes with her to visits. She also has a grand daughter and great grand daughter(whose name is Avel)   Right handed   Lives with husband   Caffeine: 1-2 cups coffee every morning, maybe 2 teas in evening with dinner   Immunization History  Administered Date(s) Administered   Fluad Quad(high Dose 65+) 12/08/2018, 12/16/2020   Moderna Covid-19 Vaccine Bivalent Booster 52yrs & up 12/28/2020   PFIZER(Purple Top)SARS-COV-2 Vaccination 05/18/2019, 06/12/2019, 12/16/2019   PNEUMOCOCCAL CONJUGATE-20 12/16/2020   Pneumococcal Polysaccharide-23 12/08/2018   Td 04/29/2009, 09/13/2009   Unspecified SARS-COV-2 Vaccination 12/07/2021   Zoster Recombinant(Shingrix) 01/10/2022, 02/08/2022     Objective: Vital Signs: BP (!) 172/80 (BP Location: Right Arm, Patient Position: Sitting, Cuff Size: Normal)   Pulse 69   Resp 17   Ht 5' 3 (1.6 m)   BMI 48.18 kg/m    Physical Exam Constitutional:      Appearance: She is obese.  Eyes:     Conjunctiva/sclera: Conjunctivae normal.  Cardiovascular:     Rate and Rhythm: Normal rate and regular rhythm.  Pulmonary:     Effort: Pulmonary effort is normal.     Breath sounds: Normal breath sounds.  Lymphadenopathy:     Cervical: No cervical adenopathy.  Skin:    General: Skin is warm and dry.  Neurological:     Mental Status: She is alert.  Psychiatric:        Mood and Affect: Mood normal.      Musculoskeletal Exam:  Shoulders full ROM no tenderness or swelling Elbows full ROM no tenderness or swelling Wrists full ROM some tenderness to pressure no palpable swelling Fingers full ROM MCP joint thickening or swelling on dorsal side, pain at MCP and PIPs with flexion, thumb MCP tenderness to pressure without synovitis Left knee with medial joint  line tenderness to pressure, no palpable effusion Ankles full ROM no tenderness or swelling  Physical Exam   MUSCULOSKELETAL: Mild pain on palpation of the wrist. Pain in thumb joint on palpation.       Investigation: No additional findings.  Imaging: No results found.  Recent Labs: Lab Results  Component Value Date   WBC 5.7 05/29/2023   HGB 11.5 (L) 05/29/2023   PLT 290 05/29/2023   NA 138 10/12/2023   K 4.2 10/12/2023   CL 98 10/12/2023   CO2 28 10/12/2023   GLUCOSE 96 10/12/2023  BUN 10 10/12/2023   CREATININE 0.94 10/12/2023   BILITOT 0.6 02/09/2023   ALKPHOS 61 02/09/2023   AST 13 (L) 02/09/2023   ALT 9 02/09/2023   PROT 7.5 02/09/2023   ALBUMIN 3.4 (L) 02/09/2023   CALCIUM  8.8 10/12/2023   GFRAA >60 09/06/2019    Speciality Comments: No specialty comments available.  Procedures:  No procedures performed Allergies: Amlodipine , Coconut fatty acid, and Naproxen sodium   Assessment / Plan:     Visit Diagnoses: Rheumatoid factor positive - Plan: hydroxychloroquine  (PLAQUENIL ) 200 MG tablet, Sedimentation rate, C-reactive protein, C3 and C4, XR Hand 2 View Right, XR Hand 2 View Left SICCA SYNDROME Chronic rheumatoid arthritis with recent exacerbation in the left hand and wrist. Hydroxychloroquine  considered for its efficacy in reducing joint pain and swelling. Discussed potential side effects, including long-term ocular toxicity, stomach upset, and rare allergic reactions. Hydroxychloroquine  preferred due to its long history of use and lower side effect profile.4 -Rechecking sed rate and CRP for inflammatory activity assessment - Start hydroxychloroquine , 400 mg daily - Order x-rays of hands to assess for changes either degeneration or any development of erosion  Polyarthralgia Suspected carpal tunnel syndrome in the left wrist, possibly secondary to swelling from rheumatoid arthritis. Improvement in swelling from rheumatoid arthritis treatment may alleviate  symptoms. - Reassess if symptoms persist after rheumatoid arthritis treatment, possibly for US  exam or NCS if needed       Orders: Orders Placed This Encounter  Procedures   XR Hand 2 View Right   XR Hand 2 View Left   Sedimentation rate   C-reactive protein   C3 and C4   Meds ordered this encounter  Medications   hydroxychloroquine  (PLAQUENIL ) 200 MG tablet    Sig: Take 2 tablets (400 mg total) by mouth daily.    Dispense:  60 tablet    Refill:  2     Follow-Up Instructions: Return in about 3 months (around 02/02/2024) for RA HCQ start f/u 3mos.   Lonni LELON Ester, MD  Note - This record has been created using [employer].  Chart creation errors have been sought, but may not always  have been located. Such creation errors do not reflect on  the standard of medical care.

## 2023-10-21 ENCOUNTER — Other Ambulatory Visit (HOSPITAL_COMMUNITY): Payer: Self-pay

## 2023-10-25 ENCOUNTER — Other Ambulatory Visit (HOSPITAL_COMMUNITY): Payer: Self-pay

## 2023-11-02 ENCOUNTER — Ambulatory Visit: Payer: Self-pay | Attending: Internal Medicine | Admitting: Internal Medicine

## 2023-11-02 ENCOUNTER — Ambulatory Visit

## 2023-11-02 ENCOUNTER — Other Ambulatory Visit (HOSPITAL_COMMUNITY): Payer: Self-pay

## 2023-11-02 ENCOUNTER — Encounter: Payer: Self-pay | Admitting: Internal Medicine

## 2023-11-02 VITALS — BP 172/80 | HR 69 | Resp 17 | Ht 63.0 in

## 2023-11-02 DIAGNOSIS — R768 Other specified abnormal immunological findings in serum: Secondary | ICD-10-CM | POA: Diagnosis not present

## 2023-11-02 DIAGNOSIS — M2550 Pain in unspecified joint: Secondary | ICD-10-CM | POA: Diagnosis not present

## 2023-11-02 DIAGNOSIS — M3500 Sicca syndrome, unspecified: Secondary | ICD-10-CM | POA: Diagnosis not present

## 2023-11-02 DIAGNOSIS — R7689 Other specified abnormal immunological findings in serum: Secondary | ICD-10-CM

## 2023-11-02 DIAGNOSIS — Z993 Dependence on wheelchair: Secondary | ICD-10-CM | POA: Diagnosis not present

## 2023-11-02 MED ORDER — HYDROXYCHLOROQUINE SULFATE 200 MG PO TABS
400.0000 mg | ORAL_TABLET | Freq: Every day | ORAL | 2 refills | Status: AC
  Filled 2023-11-02: qty 60, 30d supply, fill #0

## 2023-11-03 ENCOUNTER — Ambulatory Visit

## 2023-11-03 VITALS — Ht 63.0 in | Wt 250.0 lb

## 2023-11-03 DIAGNOSIS — Z0000 Encounter for general adult medical examination without abnormal findings: Secondary | ICD-10-CM

## 2023-11-03 LAB — C3 AND C4
C3 Complement: 152 mg/dL (ref 83–193)
C4 Complement: 18 mg/dL (ref 15–57)

## 2023-11-03 LAB — SEDIMENTATION RATE: Sed Rate: 34 mm/h — ABNORMAL HIGH (ref 0–30)

## 2023-11-03 LAB — C-REACTIVE PROTEIN: CRP: 7.3 mg/L

## 2023-11-03 NOTE — Patient Instructions (Signed)
 Nancy [name] , Thank you for taking time out of your busy schedule to complete your Annual Wellness Visit with me. I enjoyed our conversation and look forward to speaking with you again next year. I, as well as your care team,  appreciate your ongoing commitment to your health goals. Please review the following plan we discussed and let me know if I can assist you in the future. Your Game plan/ To Do List    Referrals: If you haven't heard from the office you've been referred to, please reach out to them at the phone provided.   Follow up Visits: We will see or speak with you next year for your Next Medicare AWV with our clinical staff Have you seen your provider in the last 6 months (3 months if uncontrolled diabetes)? Yes  Clinician Recommendations:  Aim for 30 minutes of exercise or brisk walking, 6-8 glasses of water, and 5 servings of fruits and vegetables each day.       This is a list of the screenings recommended for you:  Health Maintenance  Topic Date Due   DTaP/Tdap/Td vaccine (3 - Tdap) 09/14/2019   COVID-19 Vaccine (6 - 2024-25 season) 11/08/2022   Flu Shot  10/08/2023   Colon Cancer Screening  12/23/2023   Medicare Annual Wellness Visit  11/02/2024   Mammogram  06/22/2025   Pneumococcal Vaccine for age over 50  Completed   DEXA scan (bone density measurement)  Completed   Hepatitis C Screening  Completed   Zoster (Shingles) Vaccine  Completed   HPV Vaccine  Aged Out   Meningitis B Vaccine  Aged Out    Advanced directives: (Declined) Advance directive discussed with you today. Even though you declined this today, please call our office should you change your mind, and we can give you the proper paperwork for you to fill out. Advance Care Planning is important because it:  [x]  Makes sure you receive the medical care that is consistent with your values, goals, and preferences  [x]  It provides guidance to your family and loved ones and reduces their decisional burden about  whether or not they are making the right decisions based on your wishes.  Follow the link provided in your after visit summary or read over the paperwork we have mailed to you to help you started getting your Advance Directives in place. If you need assistance in completing these, please reach out to us  so that we can help you!  See attachments for Preventive Care and Fall Prevention Tips.

## 2023-11-03 NOTE — Progress Notes (Signed)
 Because this visit was a virtual/telehealth visit,  certain criteria was not obtained, such a blood pressure, CBG if applicable, and timed get up and go. Any medications not marked as taking were not mentioned during the medication reconciliation part of the visit. Any vitals not documented were not able to be obtained due to this being a telehealth visit or patient was unable to self-report a recent blood pressure reading due to a lack of equipment at home via telehealth. Vitals that have been documented are verbally provided by the patient.   Subjective:   Nancy [name] is a 68 y.o. who presents for a Medicare Wellness preventive visit.  As a reminder, Annual Wellness Visits don't include a physical exam, and some assessments may be limited, especially if this visit is performed virtually. We may recommend an in-person follow-up visit with your provider if needed.  Visit Complete: Virtual I connected with  Nancy [name] on 11/03/23 by a audio enabled telemedicine application and verified that I am speaking with the correct person using two identifiers.  Patient Location: Home  Provider Location: Office/Clinic  I discussed the limitations of evaluation and management by telemedicine. The patient expressed understanding and agreed to proceed.  Vital Signs: Because this visit was a virtual/telehealth visit, some criteria may be missing or patient reported. Any vitals not documented were not able to be obtained and vitals that have been documented are patient reported.  VideoDeclined- This patient declined Librarian, academic. Therefore the visit was completed with audio only.  Persons Participating in Visit: Patient.  AWV Questionnaire: No: Patient Medicare AWV questionnaire was not completed prior to this visit.  Cardiac Risk Factors include: advanced age (>37men, >50 women);sedentary lifestyle;hypertension;obesity (BMI >30kg/m2);dyslipidemia;family history  of premature cardiovascular disease     Objective:    Today's Vitals   11/03/23 1033  Weight: 250 lb (113.4 kg)  Height: 5' 3 (1.6 m)  PainSc: 0-No pain   Body mass index is 44.29 kg/m.     11/03/2023   10:36 AM 10/12/2023    9:26 AM 06/01/2023   11:10 AM 05/29/2023   10:24 AM 08/24/2022   10:41 AM 06/01/2022    2:25 PM 06/01/2022   10:54 AM  Advanced Directives  Does Patient Have a Medical Advance Directive? No No No No No No No  Would patient like information on creating a medical advance directive? No - Patient declined No - Patient declined No - Patient declined No - Patient declined No - Patient declined No - Patient declined No - Patient declined    Current Medications (verified) Outpatient Encounter Medications as of 11/03/2023  Medication Sig   acetaminophen  (TYLENOL ) 500 MG tablet Take 1,000 mg by mouth every 8 (eight) hours as needed for moderate pain (pain score 4-6).   albuterol  (VENTOLIN  HFA) 108 (90 Base) MCG/ACT inhaler Inhale 1-2 puffs into the lungs every 6 (six) hours as needed for wheezing or shortness of breath.   amLODipine  (NORVASC ) 5 MG tablet Take 2 tablets (10 mg total) by mouth daily.   atorvastatin  (LIPITOR) 80 MG tablet Take 1 tablet (80 mg total) by mouth daily.   chlorthalidone  (HYGROTON ) 25 MG tablet Take 1 tablet (25 mg total) by mouth daily.   diclofenac  Sodium (VOLTAREN ) 1 % GEL Apply 4 g topically every 6 (six) hours as needed (arthritis pain). (Patient not taking: Reported on 11/02/2023)   furosemide  (LASIX ) 40 MG tablet Take 1 tablet (40 mg total) by mouth daily. (Patient not taking:  Reported on 11/02/2023)   gabapentin  (NEURONTIN ) 300 MG capsule Take 1 capsule (300 mg total) by mouth at bedtime as needed.   HYDROcodone -acetaminophen  (NORCO/VICODIN) 5-325 MG tablet Take 1 tablet by mouth 2 (two) times daily as needed for severe pain (pain score 7-10).   hydroxychloroquine  (PLAQUENIL ) 200 MG tablet Take 2 tablets (400 mg total) by mouth daily.    levothyroxine  (SYNTHROID ) 50 MCG tablet Take 1 tablet (50 mcg total) by mouth daily.   losartan  (COZAAR ) 100 MG tablet Take 1 tablet (100 mg total) by mouth daily.   metoprolol  succinate (TOPROL -XL) 50 MG 24 hr tablet Take 1 tablet (50 mg total) by mouth daily. Take with or immediately following a meal.   tirzepatide  (ZEPBOUND ) 2.5 MG/0.5ML Pen Inject 2.5 mg into the skin once a week. (Patient not taking: Reported on 11/02/2023)   No facility-administered encounter medications on file as of 11/03/2023.    Allergies (verified) Amlodipine , Coconut fatty acid, and Naproxen sodium   History: Past Medical History:  Diagnosis Date   Acute cholecystitis 01/21/2021   AKI (acute kidney injury) (HCC) 12/04/2018   Back pain    chronic low back pain L4-5 discectomy:11/99. degenerative thoracic spondylotic changes 4/07   Chest pain    neg adenosine myoview.   Healthcare maintenance 12/18/2014   Hypertension    no LVH EKG-7/05, nl M/C ratio 4/07   Hypothyroidism    09/1998   Lower extremity edema    echo EF 55-65% w/o evidence of Dias dysfx.,    Menorrhagia    Microcytic anemia    09/1998. Hb-10.5/MCV 76. needs ferritin to determine ACD vs IDA   Morbid obesity (HCC)    Obstructive sleep apnea    severe 7/05 RD 161 per hr. /CPAP 18cwp   Plantar fasciitis of left foot 12/16/2020   Pneumonia due to COVID-19 virus 12/04/2018   Polyarthralgia    (knee/back/ankle) w/dx of fibromyalgia? given by [facility] rheum, knee pain chronic 2/2 obesity, fibromyalgia and Sjogren's.   Primary Sjogren's syndrome (HCC)    anti Ro+;ANA>1:1280 in homogen pattern, negative ds DNA/RF/anti Smith/RNP/C3-4 comp/la/jo1/Scleroderma/centromere, neg HIV/ACE/Hep B/C, nl CXR 2/05, schirmer salivary glandtest not done, symptom rx:eye drops/prednisone /plaquenil  referral to [facility] rheum, Dr. Chauncy 3/07. saw [name] but stopped 2/2 cost.   S/P tooth extraction 12/22/2021   Subacute cough 08/15/2009   Qualifier:  Diagnosis of   By: Joannie MD, Rosy       Uterine bleeding    Viral URI with cough 11/03/2017   Past Surgical History:  Procedure Laterality Date   CHOLECYSTECTOMY N/A 01/23/2021   Procedure: LAPAROSCOPIC CHOLECYSTECTOMY;  Surgeon: Sebastian Moles, MD;  Location: [facility] OR;  Service: General;  Laterality: N/A;   EXAMINATION UNDER ANESTHESIA  11/23/2011   Procedure: EXAM UNDER ANESTHESIA;  Surgeon: Gloris DELENA Hugger, MD;  Location: [facility] ORS;  Service: Gynecology;  Laterality: N/A;   HYSTEROSCOPY WITH D & C  11/23/2011   Procedure: DILATATION AND CURETTAGE /HYSTEROSCOPY;  Surgeon: Gloris DELENA Hugger, MD;  Location: [facility] ORS;  Service: Gynecology;  Laterality: N/A;   INJECTION KNEE Left 2025   steroid   LUMBAR FUSION  03/09/1997   SHOULDER SURGERY  unk   TONSILLECTOMY     TUBAL LIGATION  03/10/1975   WISDOM TOOTH EXTRACTION     Family History  Problem Relation Age of Onset   Hypertension Mother    Cancer Mother    Cancer Father    Cancer Sister    Thyroid  disease Sister    Ovarian cancer Other  family history   Breast cancer Other        family history in 1st degree relatives.   Colon cancer Neg Hx    Stomach cancer Neg Hx    Sleep apnea Neg Hx    Social History   Socioeconomic History   Marital status: Married    Spouse name: Not on file   Number of children: Not on file   Years of education: Not on file   Highest education level: Not on file  Occupational History   Occupation: house wife    Employer: UNEMPLOYED  Tobacco Use   Smoking status: Never    Passive exposure: Never   Smokeless tobacco: Never  Vaping Use   Vaping status: Never Used  Substance and Sexual Activity   Alcohol use: No    Alcohol/week: 0.0 standard drinks of alcohol   Drug use: No   Sexual activity: Not on file  Other Topics Concern   Not on file  Social History Narrative   Her daughter dwan) often comes with her to visits. She also has a grand daughter and great grand daughter(whose  name is Avel)   Right handed   Lives with husband   Caffeine: 1-2 cups coffee every morning, maybe 2 teas in evening with dinner   Social Drivers of Health   Financial Resource Strain: Low Risk  (11/03/2023)   Overall Financial Resource Strain (CARDIA)    Difficulty of Paying Living Expenses: Not hard at all  Food Insecurity: No Food Insecurity (11/03/2023)   Hunger Vital Sign    Worried About Running Out of Food in the Last Year: Never true    Ran Out of Food in the Last Year: Never true  Transportation Needs: No Transportation Needs (11/03/2023)   PRAPARE - Administrator, Civil Service (Medical): No    Lack of Transportation (Non-Medical): No  Physical Activity: Inactive (11/03/2023)   Exercise Vital Sign    Days of Exercise per Week: 0 days    Minutes of Exercise per Session: 0 min  Stress: No Stress Concern Present (11/03/2023)   [employer] of Occupational Health - Occupational Stress Questionnaire    Feeling of Stress: Not at all  Social Connections: Moderately Isolated (11/03/2023)   Social Connection and Isolation Panel    Frequency of Communication with Friends and Family: More than three times a week    Frequency of Social Gatherings with Friends and Family: More than three times a week    Attends Religious Services: Patient declined    Database administrator or Organizations: No    Attends Engineer, structural: Never    Marital Status: Married    Tobacco Counseling Counseling given: Not Answered    Clinical Intake:  Pre-visit preparation completed: Yes  Pain : No/denies pain Pain Score: 0-No pain     BMI - recorded: 44.29 Nutritional Status: BMI > 30  Obese Nutritional Risks: None Diabetes: No  Lab Results  Component Value Date   HGBA1C 6.3 (H) 02/09/2023   HGBA1C 5.3 12/16/2020   HGBA1C 5.6 09/14/2013     How often do you need to have someone help you when you read instructions, pamphlets, or other written materials from  your doctor or pharmacy?: 1 - Never  Interpreter Needed?: No  Information entered by :: Nancy [name] N. Nancy Fodor, LPN.   Activities of Daily Living     11/03/2023   10:36 AM  In your present state of health, do you have any  difficulty performing the following activities:  Hearing? 1  Comment WEARS HEARING AIDS  Vision? 0  Difficulty concentrating or making decisions? 0  Comment BSE: GAMES, PUZZLES, READING  Walking or climbing stairs? 1  Comment CANE, WHEELCHAIR  Dressing or bathing? 0  Doing errands, shopping? 1  Preparing Food and eating ? N  Using the Toilet? N  In the past six months, have you accidently leaked urine? Y  Comment WEAR DEPENDS WHEN GOING OUT FOR PROTECTION  Do you have problems with loss of bowel control? N  Managing your Medications? N  Managing your Finances? N  Housekeeping or managing your Housekeeping? N    Patient Care Team: Karna Fellows, MD as PCP - General (Internal Medicine) Jeannetta Lonni ORN, MD as Consulting Physician (Rheumatology) Buck Saucer, MD as Attending Physician (Neurology) [facility] as Consulting Physician (Ophthalmology)  I have updated your Care Teams any recent Medical Services you may have received from other providers in the past year.     Assessment:   This is a routine wellness examination for Nancy [name].  Hearing/Vision screen Hearing Screening - Comments:: Patient wears hearing aids. Vision Screening - Comments:: Wears rx glasses - up to date with routine eye exams with [facility]     Goals Addressed             This Visit's Progress    11/03/2023: My goal is to lose 20 pounds.         Depression Screen     11/03/2023   10:38 AM 06/01/2023   11:10 AM 08/24/2022   10:41 AM 06/01/2022    2:25 PM 06/01/2022   10:53 AM 04/15/2022   10:18 AM 04/06/2022   10:14 AM  PHQ 2/9 Scores  PHQ - 2 Score 0 0 0 0 0 0 0  PHQ- 9 Score 2          Fall Risk     11/03/2023   10:36 AM 10/12/2023    9:25 AM  06/01/2023   10:54 AM 02/09/2023    9:16 AM 08/24/2022   10:41 AM  Fall Risk   Falls in the past year? 0 0 0 0 0  Number falls in past yr: 0 0 0 0 0  Injury with Fall? 0  0 0 0  Risk for fall due to : No Fall Risks Impaired mobility;Impaired balance/gait Impaired mobility;Impaired balance/gait Impaired mobility;Impaired balance/gait Impaired mobility  Follow up Falls evaluation completed Falls prevention discussed Falls prevention discussed  Falls evaluation completed;Falls prevention discussed    MEDICARE RISK AT HOME:  Medicare Risk at Home Any stairs in or around the home?: No If so, are there any without handrails?: No Home free of loose throw rugs in walkways, pet beds, electrical cords, etc?: Yes Adequate lighting in your home to reduce risk of falls?: Yes Life alert?: No Use of a cane, walker or w/c?: Yes Grab bars in the bathroom?: Yes Shower chair or bench in shower?: Yes Elevated toilet seat or a handicapped toilet?: No  TIMED UP AND GO:  Was the test performed?  No  Cognitive Function: Declined/Normal: No cognitive concerns noted by patient or family. Patient alert, oriented, able to answer questions appropriately and recall recent events. No signs of memory loss or confusion.    11/03/2023   10:37 AM  MMSE - Mini Mental State Exam  Not completed: Unable to complete        11/03/2023   10:39 AM  6CIT Screen  What Year? 0 points  What month? 0 points  What time? 0 points  Count back from 20 0 points  Months in reverse 0 points  Repeat phrase 0 points  Total Score 0 points    Immunizations Immunization History  Administered Date(s) Administered   Fluad Quad(high Dose 65+) 12/08/2018, 12/16/2020   Moderna Covid-19 Vaccine Bivalent Booster 72yrs & up 12/28/2020   PFIZER(Purple Top)SARS-COV-2 Vaccination 05/18/2019, 06/12/2019, 12/16/2019   PNEUMOCOCCAL CONJUGATE-20 12/16/2020   Pneumococcal Polysaccharide-23 12/08/2018   Td 04/29/2009, 09/13/2009    Unspecified SARS-COV-2 Vaccination 12/07/2021   Zoster Recombinant(Shingrix) 01/10/2022, 02/08/2022    Screening Tests Health Maintenance  Topic Date Due   DTaP/Tdap/Td (3 - Tdap) 09/14/2019   COVID-19 Vaccine (6 - 2024-25 season) 11/08/2022   INFLUENZA VACCINE  10/08/2023   Colonoscopy  12/23/2023   Medicare Annual Wellness (AWV)  11/02/2024   MAMMOGRAM  06/22/2025   Pneumococcal Vaccine: 50+ Years  Completed   DEXA SCAN  Completed   Hepatitis C Screening  Completed   Zoster Vaccines- Shingrix  Completed   HPV VACCINES  Aged Out   Meningococcal B Vaccine  Aged Out    Health Maintenance  Health Maintenance Due  Topic Date Due   DTaP/Tdap/Td (3 - Tdap) 09/14/2019   COVID-19 Vaccine (6 - 2024-25 season) 11/08/2022   INFLUENZA VACCINE  10/08/2023   Colonoscopy  12/23/2023   Health Maintenance Items Addressed: Yes Patient is aware of current care gaps.  Patient is requesting a referral for colonoscopy (due 12/2023).  She is due for Covid-19, Shingrix and Flu vaccines.  Additional Screening:  Vision Screening: Recommended annual ophthalmology exams for early detection of glaucoma and other disorders of the eye. Would you like a referral to an eye doctor? No    Dental Screening: Recommended annual dental exams for proper oral hygiene  Community Resource Referral / Chronic Care Management: CRR required this visit?  No   CCM required this visit?  No   Plan:    I have personally reviewed and noted the following in the patient's chart:   Medical and social history Use of alcohol, tobacco or illicit drugs  Current medications and supplements including opioid prescriptions. Patient is currently taking opioid prescriptions. Information provided to patient regarding non-opioid alternatives. Patient advised to discuss non-opioid treatment plan with their provider. Functional ability and status Nutritional status Physical activity Advanced directives List of other  physicians Hospitalizations, surgeries, and ER visits in previous 12 months Vitals Screenings to include cognitive, depression, and falls Referrals and appointments  In addition, I have reviewed and discussed with patient certain preventive protocols, quality metrics, and best practice recommendations. A written personalized care plan for preventive services as well as general preventive health recommendations were provided to patient.   Nancy LOISE Fuller, LPN   [id number]/72/7974   After Visit Summary: (MyChart) Due to this being a telephonic visit, the after visit summary with patients personalized plan was offered to patient via MyChart   Notes: Patient is aware of current care gaps.  Patient is requesting a referral for colonoscopy (due 12/2023).  She is due for Covid-19, Shingrix and Flu vaccines.

## 2023-11-04 ENCOUNTER — Other Ambulatory Visit (HOSPITAL_COMMUNITY): Payer: Self-pay

## 2023-11-09 ENCOUNTER — Encounter: Admitting: Student

## 2023-11-14 DIAGNOSIS — E661 Drug-induced obesity: Secondary | ICD-10-CM | POA: Diagnosis not present

## 2023-11-14 DIAGNOSIS — M159 Polyosteoarthritis, unspecified: Secondary | ICD-10-CM | POA: Diagnosis not present

## 2023-11-14 DIAGNOSIS — M2550 Pain in unspecified joint: Secondary | ICD-10-CM | POA: Diagnosis not present

## 2023-11-16 ENCOUNTER — Telehealth: Payer: Self-pay | Admitting: Neurology

## 2023-11-16 ENCOUNTER — Other Ambulatory Visit (HOSPITAL_COMMUNITY): Payer: Self-pay

## 2023-11-16 ENCOUNTER — Other Ambulatory Visit: Payer: Self-pay

## 2023-11-16 ENCOUNTER — Ambulatory Visit (INDEPENDENT_AMBULATORY_CARE_PROVIDER_SITE_OTHER): Admitting: Student

## 2023-11-16 VITALS — BP 153/81 | HR 58 | Temp 97.7°F | Ht 63.0 in | Wt 268.8 lb

## 2023-11-16 DIAGNOSIS — M25562 Pain in left knee: Secondary | ICD-10-CM

## 2023-11-16 DIAGNOSIS — M25569 Pain in unspecified knee: Secondary | ICD-10-CM

## 2023-11-16 DIAGNOSIS — Z23 Encounter for immunization: Secondary | ICD-10-CM

## 2023-11-16 DIAGNOSIS — G8929 Other chronic pain: Secondary | ICD-10-CM

## 2023-11-16 DIAGNOSIS — I10 Essential (primary) hypertension: Secondary | ICD-10-CM

## 2023-11-16 MED ORDER — HYDROCODONE-ACETAMINOPHEN 5-325 MG PO TABS
1.0000 | ORAL_TABLET | Freq: Two times a day (BID) | ORAL | 0 refills | Status: DC | PRN
  Filled 2023-11-16: qty 30, 15d supply, fill #0

## 2023-11-16 MED ORDER — AMLODIPINE BESYLATE 10 MG PO TABS
10.0000 mg | ORAL_TABLET | Freq: Every day | ORAL | 11 refills | Status: AC
  Filled 2023-11-16: qty 30, 30d supply, fill #0
  Filled 2024-03-20: qty 30, 30d supply, fill #1

## 2023-11-16 NOTE — Assessment & Plan Note (Addendum)
 Patient presents with a history of hypertension, blood pressure today is 166/80 with a recheck of 153/81.  At patient's last visit 4 weeks ago, it was discovered that she was only taking her medications about 2-3 times a week.  At that appointment, no medication changes remain and was recommended that she were to have better adherence in the follow-up in 4 weeks.  At today's visit, she reported only taking her medications maybe 4 days/week.  She stated that she does not take her medications because she does not like to take medications.  She denied adverse effects, challenges getting her medications, challenges remembering to take her medications.  She stated that at home she checks her blood pressure with systolics in the 130s and 140s but does not recall the diastolic.  Her current regimen includes losartan  100 daily, metoprolol  XL 50 mg daily, amlodipine  10 mg daily, chlorthalidone  25 mg daily, and furosemide  40 mg daily.  Patient is prescribed amlodipine  5 mg 2 tablets daily for total of 10 mg, patient was only taking 1 amlodipine  5 mg.  Overall, patient's blood pressure remains uncontrolled due to noncompliance. Plan: - Educated patient on risk of uncontrolled hypertension -Will continue regimen of losartan  100 daily, metoprolol  XL 50 daily, chlorthalidone  25 mg daily, furosemide  40 mg daily, I did discontinue the amlodipine  5 mg and replace it with amlodipine  10 mg 1 tablet daily

## 2023-11-16 NOTE — Patient Instructions (Addendum)
 Thank you, [name] for allowing us  to provide your care today. Today we discussed high blood pressure.  Please note that we refilled your amlodipine  at 10 mg, please discard your 5 mg of amlodipine  tablets.  Only take 1 amlodipine  10 mg tablet a day.  Please continue the rest of your medications as prescribed.   I have ordered the following medication/changed the following medications:   Stop the following medications: Medications Discontinued During This Encounter  Medication Reason   amLODipine  (NORVASC ) 5 MG tablet    HYDROcodone -acetaminophen  (NORCO/VICODIN) 5-325 MG tablet Reorder     Start the following medications: Meds ordered this encounter  Medications   amLODipine  (NORVASC ) 10 MG tablet    Sig: Take 1 tablet (10 mg total) by mouth daily.    Dispense:  30 tablet    Refill:  11   HYDROcodone -acetaminophen  (NORCO/VICODIN) 5-325 MG tablet    Sig: Take 1 tablet by mouth 2 (two) times daily as needed for severe pain (pain score 7-10).    Dispense:  30 tablet    Refill:  0     Follow up: One monthto recheck your blood pressure.  Please bring in your blood pressure cuff and a blood pressure log, try to check your blood pressure at least 3 times a week if not more often so that we can track what your blood pressure looks like outside of the office.    Should you have any questions or concerns please call the internal medicine clinic at [phone].     Please note that our late policy has changed.  If you are more than 15 minutes late to your appointment, you may be asked to reschedule your appointment.  Dr. Kandis, D.O. [facility]

## 2023-11-16 NOTE — Telephone Encounter (Signed)
 LVM and sent mychart msg informing pt of need to reschedule 11/22/23 appt - MD out

## 2023-11-16 NOTE — Progress Notes (Signed)
 Established Patient Office Visit  Subjective   Patient ID: Nancy [name], female    DOB: 03-May-1955  Age: 68 y.o. MRN: [id number]  Chief Complaint  Patient presents with   Follow-up    Routine office visit for blood pressure / flu shot    Nancy [name] is a 68 y.o. who presents to the clinic for a 1 month follow-up of hypertension. Please see problem based assessment and plan for additional details.    Patient Active Problem List   Diagnosis Date Noted   Left hand pain 09/02/2023   Multifocal pneumonia 06/01/2023   (HFpEF) heart failure with preserved ejection fraction (HCC) 02/09/2023   Aortic atherosclerosis (HCC) 08/24/2022   Impaired mobility 04/17/2022   Cervical radiculopathy 04/17/2022   Alpha thalassaemia minor 04/06/2022   Bilateral hand pain 07/10/2021   Rheumatoid factor positive 07/10/2021   Hyperlipidemia 04/28/2021   Mixed stress and urge incontinence 12/16/2020   Asymmetrical sensorineural hearing loss 08/09/2019   Polyarthralgia 12/02/2016   GERD (gastroesophageal reflux disease) 04/13/2014   Allergic rhinitis 01/24/2014   Morbid obesity with body mass index (BMI) of 45.0 to 49.9 in adult [facility]) 09/14/2013   Abnormal CT of brain 11/19/2011   MACULAR DEGENERATION, BILATERAL 05/22/2008   Essential hypertension 03/17/2006   SICCA SYNDROME 03/17/2006   KNEE PAIN, CHRONIC 03/17/2006   Chronic low back pain 03/17/2006   Obstructive sleep apnea 09/07/2003   Hypothyroidism 09/07/1998   Microcytic anemia 09/07/1998     Objective:     BP (!) 153/81 (BP Location: Right Wrist, Patient Position: Sitting, Cuff Size: Normal)   Pulse (!) 58   Temp 97.7 F (36.5 C) (Oral)   Ht 5' 3 (1.6 m)   Wt 268 lb 12.8 oz (121.9 kg)   SpO2 91%   BMI 47.62 kg/m  BP Readings from Last 3 Encounters:  11/16/23 (!) 153/81  11/02/23 (!) 172/80  10/12/23 (!) 160/93   Wt Readings from Last 3 Encounters:  11/16/23 268 lb 12.8 oz (121.9 kg)  11/03/23 250 lb (113.4 kg)   10/12/23 272 lb (123.4 kg)      Physical Exam Vitals reviewed.  Constitutional:      General: She is not in acute distress.    Appearance: She is morbidly obese. She is not toxic-appearing.  Cardiovascular:     Rate and Rhythm: Normal rate and regular rhythm.     Heart sounds: No murmur heard. Pulmonary:     Effort: Pulmonary effort is normal.     Breath sounds: Normal breath sounds. No rales.  Musculoskeletal:     Right lower leg: No edema.     Left lower leg: No edema.  Skin:    General: Skin is warm and dry.  Neurological:     Mental Status: She is alert.  Psychiatric:        Mood and Affect: Mood and affect normal.     Last metabolic panel Lab Results  Component Value Date   GLUCOSE 96 10/12/2023   NA 138 10/12/2023   K 4.2 10/12/2023   CL 98 10/12/2023   CO2 28 10/12/2023   BUN 10 10/12/2023   CREATININE 0.94 10/12/2023   EGFR 65 10/12/2023   CALCIUM  8.8 10/12/2023   PROT 7.5 02/09/2023   ALBUMIN 3.4 (L) 02/09/2023   LABGLOB 3.2 12/16/2020   AGRATIO 1.3 12/16/2020   BILITOT 0.6 02/09/2023   ALKPHOS 61 02/09/2023   AST 13 (L) 02/09/2023   ALT 9 02/09/2023   ANIONGAP 12  05/29/2023   Last lipids Lab Results  Component Value Date   CHOL 173 10/12/2023   HDL 56 10/12/2023   LDLCALC 96 10/12/2023   TRIG 116 10/12/2023   CHOLHDL 3.1 10/12/2023   Last hemoglobin A1c Lab Results  Component Value Date   HGBA1C 6.3 (H) 02/09/2023      The 10-year ASCVD risk score (Arnett DK, et al., 2019) is: 14.6%    Assessment & Plan:   Problem List Items Addressed This Visit       Cardiovascular and Mediastinum   Essential hypertension   Patient presents with a history of hypertension, blood pressure today is 166/80 with a recheck of 153/81.  At patient's last visit 4 weeks ago, it was discovered that she was only taking her medications about 2-3 times a week.  At that appointment, no medication changes remain and was recommended that she were to have better  adherence in the follow-up in 4 weeks.  At today's visit, she reported only taking her medications maybe 4 days/week.  She stated that she does not take her medications because she does not like to take medications.  She denied adverse effects, challenges getting her medications, challenges remembering to take her medications.  She stated that at home she checks her blood pressure with systolics in the 130s and 140s but does not recall the diastolic.  Her current regimen includes losartan  100 daily, metoprolol  XL 50 mg daily, amlodipine  10 mg daily, chlorthalidone  25 mg daily, and furosemide  40 mg daily.  Patient is prescribed amlodipine  5 mg 2 tablets daily for total of 10 mg, patient was only taking 1 amlodipine  5 mg.  Overall, patient's blood pressure remains uncontrolled due to noncompliance. Plan: - Educated patient on risk of uncontrolled hypertension -Will continue regimen of losartan  100 daily, metoprolol  XL 50 daily, chlorthalidone  25 mg daily, furosemide  40 mg daily, I did discontinue the amlodipine  5 mg and replace it with amlodipine  10 mg 1 tablet daily      Relevant Medications   amLODipine  (NORVASC ) 10 MG tablet     Other   KNEE PAIN, CHRONIC   Relevant Medications   HYDROcodone -acetaminophen  (NORCO/VICODIN) 5-325 MG tablet   Other Visit Diagnoses       Encounter for immunization    -  Primary   Relevant Orders   Flu vaccine HIGH DOSE PF(Fluzone Trivalent) (Completed)       Return in about 4 weeks (around 12/14/2023) for BP.    Damien Lease, DO

## 2023-11-17 NOTE — Progress Notes (Signed)
 Internal Medicine Clinic Attending  Case discussed with the resident at the time of the visit.  We reviewed the resident's history and exam and pertinent patient test results.  I agree with the assessment, diagnosis, and plan of care documented in the resident's note.

## 2023-11-22 ENCOUNTER — Ambulatory Visit: Admitting: Neurology

## 2023-12-14 DIAGNOSIS — M2550 Pain in unspecified joint: Secondary | ICD-10-CM | POA: Diagnosis not present

## 2023-12-14 DIAGNOSIS — E661 Drug-induced obesity: Secondary | ICD-10-CM | POA: Diagnosis not present

## 2023-12-14 DIAGNOSIS — M159 Polyosteoarthritis, unspecified: Secondary | ICD-10-CM | POA: Diagnosis not present

## 2023-12-17 DIAGNOSIS — H2513 Age-related nuclear cataract, bilateral: Secondary | ICD-10-CM | POA: Diagnosis not present

## 2023-12-17 DIAGNOSIS — H353132 Nonexudative age-related macular degeneration, bilateral, intermediate dry stage: Secondary | ICD-10-CM | POA: Diagnosis not present

## 2023-12-17 DIAGNOSIS — H04123 Dry eye syndrome of bilateral lacrimal glands: Secondary | ICD-10-CM | POA: Diagnosis not present

## 2023-12-17 DIAGNOSIS — H353131 Nonexudative age-related macular degeneration, bilateral, early dry stage: Secondary | ICD-10-CM | POA: Diagnosis not present

## 2024-01-14 DIAGNOSIS — M159 Polyosteoarthritis, unspecified: Secondary | ICD-10-CM | POA: Diagnosis not present

## 2024-01-14 DIAGNOSIS — M2550 Pain in unspecified joint: Secondary | ICD-10-CM | POA: Diagnosis not present

## 2024-01-14 DIAGNOSIS — E661 Drug-induced obesity: Secondary | ICD-10-CM | POA: Diagnosis not present

## 2024-01-19 NOTE — Progress Notes (Deleted)
 Office Visit Note  Patient: Nancy [name]             Date of Birth: [birth year]           MRN: [id number]             PCP: Karna Fellows, MD Referring: Karna Fellows, MD Visit Date: 02/02/2024   Subjective:  No chief complaint on file.   History of Present Illness: Nancy [name] is a 68 y.o. female here for follow up for evaluation of joint pain in multiple areas and abnormal labs concerning for inflammatory arthritis currently with ongoing bilateral hand and knee pain issues are the worst.   Previous HPI 11/02/2023 Nancy [name] is a 68 y.o. female here for follow up for evaluation of joint pain in multiple areas and abnormal labs concerning for inflammatory arthritis currently with ongoing bilateral hand and knee pain issues are the worst.   We have not seen her for 2023 at the time had been concerned for new diagnosis of seropositive rheumatoid arthritis or possibly Sjogren syndrome with related arthropathy but never followed up on starting any long-term medications for this.   She has been experiencing increased joint pain and swelling, particularly in her left hand, for a couple of weeks. She describes difficulty in grabbing objects, with her hand cramping, leading to dropping things. No similar symptoms in her feet or knees.   She has been using a lidocaine  patch on her hand, which provides occasional relief. Today, she is experiencing increased difficulty in using her hand.   She received knee injections a couple of months ago, which have alleviated her knee issues. She recalls seeing a specialist at the [facility] for these injections.     Previous HPI 01/12/2022 Nancy [name] is a 68 y.o. female here for follow up for evaluation of joint pain in multiple areas and abnormal labs concerning for inflammatory arthritis currently with ongoing bilateral hand and knee pain issues are the worst. Lab results at initial visit did show persistent sedimentation rate elevation and  suspicious for inflammatory pain component. Left knee was injected with Dr. Vernetta after last visit with temporary improvement. She is currently in wheelchair while out and about, also using walker for support at home and feels knee is giving out or locking up occasionally.   Previous HPI 07/10/2021  Nancy [name] is a 68 y.o. female here for evaluation of joint pain in multiple areas and abnormal labs concerning for inflammatory arthritis. She apparently had previous rheumatology assessment with [facility] but not on long term DMARD treatment. Bilateral hand pain affecting her knuckles she sees swelling of MCP joints or PIP joints intermittently. On other days not much morning stiffness and pain. She has ongoing knee pain worst on the left side. She saw Dr. Vernetta with intraarticular steroid injection a week ago and she had improvement but still a lot of active joint pain. Mobility is limited using a walker or more often wheelchair due to pain in her knees while weightbearing. She takes tylenol  or occasionally hydrocodone  for pain.    Labs reviewed ANA neg SSA >8.0 SSB neg RF 75.1 CCP >250 ESR 38   Activities of Daily Living:  Patient reports morning stiffness for several hours.   Patient Reports nocturnal pain.  Difficulty dressing/grooming: Reports Difficulty climbing stairs: Reports Difficulty getting out of chair: Reports Difficulty using hands for taps, buttons, cutlery, and/or writing: Reports   No Rheumatology ROS completed.   PMFS  History:  Patient Active Problem List   Diagnosis Date Noted   Left hand pain 09/02/2023   Multifocal pneumonia 06/01/2023   (HFpEF) heart failure with preserved ejection fraction (HCC) 02/09/2023   Aortic atherosclerosis 08/24/2022   Impaired mobility 04/17/2022   Cervical radiculopathy 04/17/2022   Alpha thalassaemia minor 04/06/2022   Bilateral hand pain 07/10/2021   Rheumatoid factor positive 07/10/2021   Hyperlipidemia 04/28/2021    Mixed stress and urge incontinence 12/16/2020   Asymmetrical sensorineural hearing loss 08/09/2019   Polyarthralgia 12/02/2016   GERD (gastroesophageal reflux disease) 04/13/2014   Allergic rhinitis 01/24/2014   Morbid obesity with body mass index (BMI) of 45.0 to 49.9 in adult [facility]) 09/14/2013   Abnormal CT of brain 11/19/2011   MACULAR DEGENERATION, BILATERAL 05/22/2008   Essential hypertension 03/17/2006   SICCA SYNDROME 03/17/2006   KNEE PAIN, CHRONIC 03/17/2006   Chronic low back pain 03/17/2006   Obstructive sleep apnea 09/07/2003   Hypothyroidism 09/07/1998   Microcytic anemia 09/07/1998    Past Medical History:  Diagnosis Date   Acute cholecystitis 01/21/2021   AKI (acute kidney injury) 12/04/2018   Back pain    chronic low back pain L4-5 discectomy:11/99. degenerative thoracic spondylotic changes 4/07   Chest pain    neg adenosine myoview.   Healthcare maintenance 12/18/2014   Hypertension    no LVH EKG-7/05, nl M/C ratio 4/07   Hypothyroidism    09/1998   Lower extremity edema    echo EF 55-65% w/o evidence of Dias dysfx.,    Menorrhagia    Microcytic anemia    09/1998. Hb-10.5/MCV 76. needs ferritin to determine ACD vs IDA   Morbid obesity (HCC)    Obstructive sleep apnea    severe 7/05 RD 161 per hr. /CPAP 18cwp   Plantar fasciitis of left foot 12/16/2020   Pneumonia due to COVID-19 virus 12/04/2018   Polyarthralgia    (knee/back/ankle) w/dx of fibromyalgia? given by [facility] rheum, knee pain chronic 2/2 obesity, fibromyalgia and Sjogren's.   Primary Sjogren's syndrome    anti Ro+;ANA>1:1280 in homogen pattern, negative ds DNA/RF/anti Smith/RNP/C3-4 comp/la/jo1/Scleroderma/centromere, neg HIV/ACE/Hep B/C, nl CXR 2/05, schirmer salivary glandtest not done, symptom rx:eye drops/prednisone /plaquenil  referral to [facility] rheum, Dr. Chauncy 3/07. saw [name] but stopped 2/2 cost.   S/P tooth extraction 12/22/2021   Subacute cough 08/15/2009   Qualifier:  Diagnosis of   By: Joannie MD, Rosy       Uterine bleeding    Viral URI with cough 11/03/2017    Family History  Problem Relation Age of Onset   Hypertension Mother    Cancer Mother    Cancer Father    Cancer Sister    Thyroid  disease Sister    Ovarian cancer Other        family history   Breast cancer Other        family history in 1st degree relatives.   Colon cancer Neg Hx    Stomach cancer Neg Hx    Sleep apnea Neg Hx    Past Surgical History:  Procedure Laterality Date   CHOLECYSTECTOMY N/A 01/23/2021   Procedure: LAPAROSCOPIC CHOLECYSTECTOMY;  Surgeon: Sebastian Moles, MD;  Location: [facility] OR;  Service: General;  Laterality: N/A;   EXAMINATION UNDER ANESTHESIA  11/23/2011   Procedure: EXAM UNDER ANESTHESIA;  Surgeon: Gloris DELENA Hugger, MD;  Location: [facility] ORS;  Service: Gynecology;  Laterality: N/A;   HYSTEROSCOPY WITH D & C  11/23/2011   Procedure: DILATATION AND CURETTAGE /HYSTEROSCOPY;  Surgeon: Gloris DELENA Hugger,  MD;  Location: [facility] ORS;  Service: Gynecology;  Laterality: N/A;   INJECTION KNEE Left 2025   steroid   LUMBAR FUSION  03/09/1997   SHOULDER SURGERY  unk   TONSILLECTOMY     TUBAL LIGATION  03/10/1975   WISDOM TOOTH EXTRACTION     Social History   Social History Narrative   Her daughter dwan) often comes with her to visits. She also has a grand daughter and great grand daughter(whose name is Avel)   Right handed   Lives with husband   Caffeine: 1-2 cups coffee every morning, maybe 2 teas in evening with dinner   Immunization History  Administered Date(s) Administered   Fluad Quad(high Dose 65+) 12/08/2018, 12/16/2020   INFLUENZA, HIGH DOSE SEASONAL PF 11/16/2023   Moderna Covid-19 Vaccine Bivalent Booster 59yrs & up 12/28/2020   PFIZER(Purple Top)SARS-COV-2 Vaccination 05/18/2019, 06/12/2019, 12/16/2019   PNEUMOCOCCAL CONJUGATE-20 12/16/2020   Pneumococcal Polysaccharide-23 12/08/2018   Td 04/29/2009, 09/13/2009   Unspecified SARS-COV-2  Vaccination 12/07/2021   Zoster Recombinant(Shingrix) 01/10/2022, 02/08/2022     Objective: Vital Signs: There were no vitals taken for this visit.   Physical Exam   Musculoskeletal Exam: ***  CDAI Exam: CDAI Score: -- Patient Global: --; Provider Global: -- Swollen: --; Tender: -- Joint Exam 02/02/2024   No joint exam has been documented for this visit   There is currently no information documented on the homunculus. Go to the Rheumatology activity and complete the homunculus joint exam.  Investigation: No additional findings.  Imaging: No results found.  Recent Labs: Lab Results  Component Value Date   WBC 5.7 05/29/2023   HGB 11.5 (L) 05/29/2023   PLT 290 05/29/2023   NA 138 10/12/2023   K 4.2 10/12/2023   CL 98 10/12/2023   CO2 28 10/12/2023   GLUCOSE 96 10/12/2023   BUN 10 10/12/2023   CREATININE 0.94 10/12/2023   BILITOT 0.6 02/09/2023   ALKPHOS 61 02/09/2023   AST 13 (L) 02/09/2023   ALT 9 02/09/2023   PROT 7.5 02/09/2023   ALBUMIN 3.4 (L) 02/09/2023   CALCIUM  8.8 10/12/2023   GFRAA >60 09/06/2019    Speciality Comments: No specialty comments available.  Procedures:  No procedures performed Allergies: Amlodipine , Coconut fatty acid, and Naproxen sodium   Assessment / Plan:     Visit Diagnoses: No diagnosis found.  ***  Orders: No orders of the defined types were placed in this encounter.  No orders of the defined types were placed in this encounter.    Follow-Up Instructions: No follow-ups on file.   Emmitte Surgeon M Vu Liebman, CMA  Note - This record has been created using Animal nutritionist.  Chart creation errors have been sought, but may not always  have been located. Such creation errors do not reflect on  the standard of medical care.

## 2024-02-02 ENCOUNTER — Ambulatory Visit: Admitting: Internal Medicine

## 2024-02-02 DIAGNOSIS — M3500 Sicca syndrome, unspecified: Secondary | ICD-10-CM

## 2024-02-02 DIAGNOSIS — M2550 Pain in unspecified joint: Secondary | ICD-10-CM

## 2024-02-02 DIAGNOSIS — R7689 Other specified abnormal immunological findings in serum: Secondary | ICD-10-CM

## 2024-02-13 DIAGNOSIS — M2550 Pain in unspecified joint: Secondary | ICD-10-CM | POA: Diagnosis not present

## 2024-02-13 DIAGNOSIS — M159 Polyosteoarthritis, unspecified: Secondary | ICD-10-CM | POA: Diagnosis not present

## 2024-02-13 DIAGNOSIS — E661 Drug-induced obesity: Secondary | ICD-10-CM | POA: Diagnosis not present

## 2024-02-15 DIAGNOSIS — M2550 Pain in unspecified joint: Secondary | ICD-10-CM | POA: Diagnosis not present

## 2024-02-15 DIAGNOSIS — M159 Polyosteoarthritis, unspecified: Secondary | ICD-10-CM | POA: Diagnosis not present

## 2024-02-15 DIAGNOSIS — E661 Drug-induced obesity: Secondary | ICD-10-CM | POA: Diagnosis not present

## 2024-02-16 ENCOUNTER — Telehealth: Payer: Self-pay

## 2024-02-16 NOTE — Telephone Encounter (Signed)
 Attempted to reach patient concerning colonoscopy recall; unable to speak with patient;  left message and number to the office for patient to call back and schedule appts;

## 2024-03-15 ENCOUNTER — Other Ambulatory Visit: Payer: Self-pay | Admitting: Internal Medicine

## 2024-03-15 ENCOUNTER — Other Ambulatory Visit (HOSPITAL_COMMUNITY): Payer: Self-pay

## 2024-03-15 ENCOUNTER — Ambulatory Visit (HOSPITAL_COMMUNITY)
Admission: RE | Admit: 2024-03-15 | Discharge: 2024-03-15 | Disposition: A | Discharge status: Home / Self Care | Source: Ambulatory Visit | Attending: Internal Medicine | Admitting: Internal Medicine

## 2024-03-15 ENCOUNTER — Ambulatory Visit: Payer: Self-pay

## 2024-03-15 VITALS — BP 169/98 | HR 68 | Temp 98.2°F | Ht 63.0 in | Wt 274.0 lb

## 2024-03-15 DIAGNOSIS — R051 Acute cough: Secondary | ICD-10-CM | POA: Insufficient documentation

## 2024-03-15 DIAGNOSIS — I5031 Acute diastolic (congestive) heart failure: Secondary | ICD-10-CM | POA: Diagnosis not present

## 2024-03-15 MED ORDER — BENZONATATE 200 MG PO CAPS
200.0000 mg | ORAL_CAPSULE | Freq: Three times a day (TID) | ORAL | 0 refills | Status: AC | PRN
  Filled 2024-03-15: qty 30, 10d supply, fill #0

## 2024-03-15 MED ORDER — FUROSEMIDE 40 MG PO TABS
40.0000 mg | ORAL_TABLET | Freq: Every day | ORAL | 0 refills | Status: DC
  Filled 2024-03-15: qty 30, 30d supply, fill #0

## 2024-03-15 NOTE — Addendum Note (Signed)
 Addended by: NAPOLEON MELVENIA HERO on: 03/15/2024 02:46 PM   Modules accepted: Orders

## 2024-03-15 NOTE — Progress Notes (Addendum)
 "  CC: Acute Concern of cough  HPI:  Nancy [name] is a 69 y.o. female with pertinent PMH of HFpEF, OSA, RA on hydroxychloroquine , obesity,   who presents for acute cough for 2 weeks. Please see problem based assessment and plan for further history.  ROS  Medications: Current Outpatient Medications  Medication Instructions   acetaminophen  (TYLENOL ) 1,000 mg, Every 8 hours PRN   albuterol  (VENTOLIN  HFA) 108 (90 Base) MCG/ACT inhaler 1-2 puffs, Inhalation, Every 6 hours PRN   amLODipine  (NORVASC ) 10 mg, Oral, Daily   atorvastatin  (LIPITOR) 80 mg, Oral, Daily   chlorthalidone  (HYGROTON ) 25 mg, Oral, Daily   diclofenac  Sodium (VOLTAREN ) 4 g, Topical, Every 6 hours PRN   furosemide  (LASIX ) 40 mg, Oral, Daily   gabapentin  (NEURONTIN ) 300 mg, Oral, At bedtime PRN   HYDROcodone -acetaminophen  (NORCO/VICODIN) 5-325 MG tablet 1 tablet, Oral, 2 times daily PRN   hydroxychloroquine  (PLAQUENIL ) 400 mg, Oral, Daily   levothyroxine  (SYNTHROID ) 50 mcg, Oral, Daily   losartan  (COZAAR ) 100 mg, Oral, Daily   metoprolol  succinate (TOPROL -XL) 50 mg, Oral, Daily, Take with or immediately following a meal.   tirzepatide  (ZEPBOUND ) 2.5 mg, Subcutaneous, Weekly     Physical Exam:  Vitals:   03/15/24 1044 03/15/24 1106  BP: (!) 167/92 (!) 169/98  Pulse: 80 68  Temp: 98.2 F (36.8 C)   TempSrc: Oral   SpO2: 92% 90%  Weight: 274 lb (124.3 kg)   Height: 5' 3 (1.6 m)     Physical Exam Constitutional:      General: She is not in acute distress.    Appearance: She is obese.     Comments: In wheelchair under a blanket, uncomfortable appearing  HENT:     Nose: No congestion or rhinorrhea.     Mouth/Throat:     Mouth: Mucous membranes are moist.  Cardiovascular:     Rate and Rhythm: Normal rate and regular rhythm.     Heart sounds: No murmur heard.    No friction rub. No gallop.  Pulmonary:     Effort: No respiratory distress.     Breath sounds: No stridor. Rales present. No wheezing or  rhonchi.     Comments: Crackles at the base of the right lung, all other fields clear to auscultation Musculoskeletal:     Right lower leg: No edema.     Left lower leg: No edema.  Lymphadenopathy:     Cervical: No cervical adenopathy.  Neurological:     Mental Status: She is alert.       Assessment & Plan:   Assessment & Plan Acute cough The patient presents today with 2 weeks of a bothersome cough.  She denies fevers but she has felt chilled recurrently.  She has had some trouble sleeping.  She is dyspneic over her usual amount of dyspnea.  She had no preceding upper respiratory symptoms.  She denies congestion, tearing, throat soreness.  She does report that she has some pleuritic chest pain when she is coughing.  The sputum color has transition from green at the beginning to more clear now.  He states that her symptoms have waxed and waned but are overall about the same since when they happen.  She denies any sick contacts.  She has not tried anything to manage her symptoms.  She denies any lower extremity swelling.  On physical exam she has crackles at the base of the right lung.  The rest of her lung exam is normal.  She is saturating  at 90% and 92% on room air which is new for her.  She is nontachycardic but she is hypertensive to 169/98 today.  She has not been taking some of her medications because she feels sick.  She feels fatigued.  Differential for her includes viral illness, community-acquired pneumonia, HFpEF exacerbation.  I think most likely given her asymmetric crackles that community-acquired pneumonia is the most likely diagnosis for her.  If an x-ray does not show any evidence of pneumonia, it is most likely a viral illness. PSI score 71 points and appropriate for outpatient treatment  -Chest x-ray at [facility] imaging center today. - CBC and BMP - If chest x-ray shows focal consolidation, would start on CAP coverage - Tessalon  perles for cough  Addendum: Per my and  Dr. Trudy preliminary read of the x-ray, it seems that there may be some pulmonary edema. There is no focal opacity of the lung. Given the patient's history of HFpEF, we will add on a proBNP to her labs and prescribe her Lasix  40mg  PO daily and instructed patient to weigh herself daily. Will call to update the patient once the official read for the x-ray is back.   Patient should follow-up in one week. Acute heart failure with preserved ejection fraction (HCC)   No orders of the defined types were placed in this encounter.  Follow-up in 1 month for hypertension, patient hypertensive today but did not take medications this morning  ADD: Patient to follow-up in one week  Patient discussed with Dr. Mliss Trudy Melvenia Napoleon, MD [facility] Internal Medicine Resident PGY-1 Clinic Phone: [phone] Please contact the on call pager at [phone] for any urgent or emergent needs.  "

## 2024-03-15 NOTE — Patient Instructions (Signed)
 Thank you, [name], for allowing us  to provide your care today. Today we discussed . . .  > Cough       - There are a couple different things that your cough may be.  I think that your cough is most likely related to pneumonia.  Will need to get a chest x-ray to see if you do have pneumonia.  If you have pneumonia, I will send in antibiotics to treat the pneumonia.  If the chest x-ray does not show pneumonia, it is most likely a viral illness.  You can try to manage her symptoms with Mucinex  and other over-the-counter remedies and the symptoms should resolve on their own if it is viral.  I will also send in Tessalon  Perles to your pharmacy. > High blood pressure       - Your blood pressure was high at today's visit.  Please continue to take all of your meds and follow-up in 1 month to discuss your blood pressure   I have ordered the following labs for you:   Lab Orders         CBC with Diff         Basic metabolic panel with GFR       Referrals ordered today:   Referral Orders  No referral(s) requested today      Follow up: 1 month    Remember:  Should you have any questions or concerns please call the internal medicine clinic at [phone].     Nancy [name], [facility]

## 2024-03-15 NOTE — Progress Notes (Deleted)
" ° °  CC: {Clinic Visit Type:31040}  HPI:  Nancy [name] is a 69 y.o. female with pertinent PMH of *** who presents for ***. Please see problem based assessment and plan for further history.  ROS  Medications: Current Outpatient Medications  Medication Instructions   acetaminophen  (TYLENOL ) 1,000 mg, Every 8 hours PRN   albuterol  (VENTOLIN  HFA) 108 (90 Base) MCG/ACT inhaler 1-2 puffs, Inhalation, Every 6 hours PRN   amLODipine  (NORVASC ) 10 mg, Oral, Daily   atorvastatin  (LIPITOR) 80 mg, Oral, Daily   chlorthalidone  (HYGROTON ) 25 mg, Oral, Daily   diclofenac  Sodium (VOLTAREN ) 4 g, Topical, Every 6 hours PRN   furosemide  (LASIX ) 40 mg, Oral, Daily   gabapentin  (NEURONTIN ) 300 mg, Oral, At bedtime PRN   HYDROcodone -acetaminophen  (NORCO/VICODIN) 5-325 MG tablet 1 tablet, Oral, 2 times daily PRN   hydroxychloroquine  (PLAQUENIL ) 400 mg, Oral, Daily   levothyroxine  (SYNTHROID ) 50 mcg, Oral, Daily   losartan  (COZAAR ) 100 mg, Oral, Daily   metoprolol  succinate (TOPROL -XL) 50 mg, Oral, Daily, Take with or immediately following a meal.   tirzepatide  (ZEPBOUND ) 2.5 mg, Subcutaneous, Weekly     Physical Exam:  Vitals:   03/15/24 1044 03/15/24 1106  BP: (!) 167/92 (!) 169/98  Pulse: 80 68  Temp: 98.2 F (36.8 C)   TempSrc: Oral   SpO2: 92% 90%  Weight: 274 lb (124.3 kg)   Height: 5' 3 (1.6 m)     Physical Exam    Assessment & Plan:   Assessment & Plan   No orders of the defined types were placed in this encounter.  Some days bettter, some days worse, feels tired.  Coughing up snot that is clear and thick Greenish in the past. Getting better and worse  Chills. No fever. Pleuritic pain under breasts. Tylenol . Mucinex  helped a little.  Inhaler helps a lot.  Patient {GC/[id number]::discussed with,seen with} {[id number]/2026:[id number]}  ***HPI deleted ***Routed ***spell check  Melvenia Morrison, MD [facility] Center Internal Medicine Resident  PGY-1 Clinic Phone: [phone] Please contact the on call pager at [phone] for any urgent or emergent needs.  "

## 2024-03-16 ENCOUNTER — Other Ambulatory Visit (HOSPITAL_COMMUNITY): Payer: Self-pay

## 2024-03-16 LAB — BASIC METABOLIC PANEL WITH GFR
BUN/Creatinine Ratio: 10 — ABNORMAL LOW (ref 12–28)
BUN: 9 mg/dL (ref 8–27)
CO2: 30 mmol/L — ABNORMAL HIGH (ref 20–29)
Calcium: 9.2 mg/dL (ref 8.7–10.3)
Chloride: 95 mmol/L — ABNORMAL LOW (ref 96–106)
Creatinine, Ser: 0.87 mg/dL (ref 0.57–1.00)
Glucose: 101 mg/dL — ABNORMAL HIGH (ref 70–99)
Potassium: 4.4 mmol/L (ref 3.5–5.2)
Sodium: 136 mmol/L (ref 134–144)
eGFR: 71 mL/min/1.73

## 2024-03-16 LAB — CBC WITH DIFFERENTIAL/PLATELET
Basophils Absolute: 0 x10E3/uL (ref 0.0–0.2)
Basos: 1 %
EOS (ABSOLUTE): 0.3 x10E3/uL (ref 0.0–0.4)
Eos: 6 %
Hematocrit: 36.5 % (ref 34.0–46.6)
Hemoglobin: 11 g/dL — ABNORMAL LOW (ref 11.1–15.9)
Immature Grans (Abs): 0 x10E3/uL (ref 0.0–0.1)
Immature Granulocytes: 0 %
Lymphocytes Absolute: 1.8 x10E3/uL (ref 0.7–3.1)
Lymphs: 38 %
MCH: 24 pg — ABNORMAL LOW (ref 26.6–33.0)
MCHC: 30.1 g/dL — ABNORMAL LOW (ref 31.5–35.7)
MCV: 80 fL (ref 79–97)
Monocytes Absolute: 0.5 x10E3/uL (ref 0.1–0.9)
Monocytes: 9 %
Neutrophils Absolute: 2.2 x10E3/uL (ref 1.4–7.0)
Neutrophils: 46 %
Platelets: 281 x10E3/uL (ref 150–450)
RBC: 4.58 x10E6/uL (ref 3.77–5.28)
RDW: 14.5 % (ref 11.7–15.4)
WBC: 4.9 x10E3/uL (ref 3.4–10.8)

## 2024-03-17 LAB — PRO B NATRIURETIC PEPTIDE: NT-Pro BNP: 219 pg/mL (ref 0–301)

## 2024-03-17 LAB — SPECIMEN STATUS REPORT

## 2024-03-20 ENCOUNTER — Other Ambulatory Visit (HOSPITAL_COMMUNITY): Payer: Self-pay

## 2024-03-20 ENCOUNTER — Encounter (HOSPITAL_COMMUNITY): Payer: Self-pay

## 2024-03-20 ENCOUNTER — Other Ambulatory Visit: Payer: Self-pay | Admitting: Internal Medicine

## 2024-03-20 ENCOUNTER — Other Ambulatory Visit: Payer: Self-pay

## 2024-03-20 ENCOUNTER — Telehealth (HOSPITAL_COMMUNITY): Payer: Self-pay

## 2024-03-20 ENCOUNTER — Other Ambulatory Visit: Payer: Self-pay | Admitting: Student

## 2024-03-20 DIAGNOSIS — I5031 Acute diastolic (congestive) heart failure: Secondary | ICD-10-CM

## 2024-03-20 DIAGNOSIS — I10 Essential (primary) hypertension: Secondary | ICD-10-CM

## 2024-03-20 DIAGNOSIS — E039 Hypothyroidism, unspecified: Secondary | ICD-10-CM

## 2024-03-20 DIAGNOSIS — M545 Low back pain, unspecified: Secondary | ICD-10-CM

## 2024-03-20 DIAGNOSIS — M25562 Pain in left knee: Secondary | ICD-10-CM

## 2024-03-20 DIAGNOSIS — E785 Hyperlipidemia, unspecified: Secondary | ICD-10-CM

## 2024-03-20 MED ORDER — METOPROLOL SUCCINATE ER 50 MG PO TB24
50.0000 mg | ORAL_TABLET | Freq: Every day | ORAL | 3 refills | Status: AC
  Filled 2024-03-20: qty 90, 90d supply, fill #0

## 2024-03-20 MED ORDER — CHLORTHALIDONE 25 MG PO TABS
25.0000 mg | ORAL_TABLET | Freq: Every day | ORAL | 3 refills | Status: AC
  Filled 2024-03-20: qty 90, 90d supply, fill #0

## 2024-03-20 MED ORDER — HYDROCODONE-ACETAMINOPHEN 5-325 MG PO TABS
1.0000 | ORAL_TABLET | Freq: Two times a day (BID) | ORAL | 0 refills | Status: AC | PRN
  Filled 2024-03-20: qty 30, 15d supply, fill #0

## 2024-03-20 MED ORDER — LEVOTHYROXINE SODIUM 50 MCG PO TABS
50.0000 ug | ORAL_TABLET | Freq: Every day | ORAL | 3 refills | Status: AC
  Filled 2024-03-20: qty 90, 90d supply, fill #0

## 2024-03-20 MED ORDER — LOSARTAN POTASSIUM 100 MG PO TABS
100.0000 mg | ORAL_TABLET | Freq: Every day | ORAL | 3 refills | Status: AC
  Filled 2024-03-20: qty 90, 90d supply, fill #0

## 2024-03-20 MED ORDER — ATORVASTATIN CALCIUM 80 MG PO TABS
80.0000 mg | ORAL_TABLET | Freq: Every day | ORAL | 3 refills | Status: AC
  Filled 2024-03-20: qty 90, 90d supply, fill #0

## 2024-03-20 MED ORDER — DICLOFENAC SODIUM 1 % EX GEL
4.0000 g | Freq: Four times a day (QID) | CUTANEOUS | 0 refills | Status: DC | PRN
  Filled 2024-03-20: qty 100, 7d supply, fill #0

## 2024-03-20 MED ORDER — ALBUTEROL SULFATE HFA 108 (90 BASE) MCG/ACT IN AERS
1.0000 | INHALATION_SPRAY | Freq: Four times a day (QID) | RESPIRATORY_TRACT | 0 refills | Status: AC | PRN
  Filled 2024-03-20: qty 6.7, 25d supply, fill #0

## 2024-03-20 NOTE — Telephone Encounter (Signed)
 Refill not appropriate - Dr Napoleon just filled these.  I have refilled her other chronic medicines including her pain medicine

## 2024-03-22 ENCOUNTER — Other Ambulatory Visit (HOSPITAL_COMMUNITY): Payer: Self-pay

## 2024-03-22 ENCOUNTER — Telehealth: Payer: Self-pay

## 2024-03-22 NOTE — Telephone Encounter (Signed)
 Pharmacy Patient Advocate Encounter   Received notification from RX Request Messages that prior authorization for Zepbound  2.5mg /0.50ml is required/requested.   Insurance verification completed.   The patient is insured through [employer].   Per test claim: PA required; PA submitted to above mentioned insurance via Latent Key/confirmation #/EOC [facility] Status is pending

## 2024-03-22 NOTE — Telephone Encounter (Signed)
 PA originally denied 10/12/23 (submitted for dx of obesity with 10/12/23 chart notes).   Appeal faxed 10/14/23. No further notes or decision.   Will either need new appt for OSA with appropriate documentation, or new PA.  Laneta let me know :)

## 2024-03-22 NOTE — Telephone Encounter (Signed)
 Pharmacy Patient Advocate Encounter  Received notification from [employer] that Prior Authorization for Zepbound  2.5mg /0.40ml has been APPROVED from 03/09/24 to 03/08/25. Ran test claim, Copay is $12.65. This test claim was processed through [facility]- copay amounts may vary at other pharmacies due to pharmacy/plan contracts, or as the patient moves through the different stages of their insurance plan.   PA #/Case ID/Reference #: [id number]

## 2024-03-24 ENCOUNTER — Ambulatory Visit: Payer: Self-pay

## 2024-03-24 DIAGNOSIS — J984 Other disorders of lung: Secondary | ICD-10-CM

## 2024-03-28 NOTE — Progress Notes (Signed)
 Internal Medicine Clinic Attending  Case discussed with the resident at the time of the visit.  We reviewed the resident's history and exam and pertinent patient test results.  I agree with the assessment, diagnosis, and plan of care documented in the resident's note.

## 2024-04-14 ENCOUNTER — Ambulatory Visit: Payer: Self-pay

## 2024-04-14 ENCOUNTER — Other Ambulatory Visit (HOSPITAL_COMMUNITY): Payer: Self-pay

## 2024-04-14 VITALS — BP 160/90 | HR 91 | Temp 97.7°F

## 2024-04-14 DIAGNOSIS — G4733 Obstructive sleep apnea (adult) (pediatric): Secondary | ICD-10-CM

## 2024-04-14 DIAGNOSIS — I5031 Acute diastolic (congestive) heart failure: Secondary | ICD-10-CM

## 2024-04-14 DIAGNOSIS — G8929 Other chronic pain: Secondary | ICD-10-CM

## 2024-04-14 DIAGNOSIS — M25562 Pain in left knee: Secondary | ICD-10-CM

## 2024-04-14 DIAGNOSIS — I10 Essential (primary) hypertension: Secondary | ICD-10-CM

## 2024-04-14 DIAGNOSIS — M791 Myalgia, unspecified site: Secondary | ICD-10-CM

## 2024-04-14 MED ORDER — HYDROCODONE-ACETAMINOPHEN 5-325 MG PO TABS
1.0000 | ORAL_TABLET | Freq: Two times a day (BID) | ORAL | 0 refills | Status: AC | PRN
  Filled 2024-04-14: qty 30, 15d supply, fill #0

## 2024-04-14 MED ORDER — FUROSEMIDE 40 MG PO TABS
20.0000 mg | ORAL_TABLET | Freq: Every day | ORAL | 1 refills | Status: AC
  Filled 2024-04-14: qty 30, 60d supply, fill #0

## 2024-04-14 MED ORDER — TIRZEPATIDE-WEIGHT MANAGEMENT 2.5 MG/0.5ML ~~LOC~~ SOAJ
2.5000 mg | SUBCUTANEOUS | 1 refills | Status: AC
  Filled 2024-04-14: qty 2, 28d supply, fill #0

## 2024-04-14 MED ORDER — DICLOFENAC SODIUM 1 % EX GEL
4.0000 g | Freq: Four times a day (QID) | CUTANEOUS | 2 refills | Status: AC | PRN
  Filled 2024-04-14: qty 100, 7d supply, fill #0

## 2024-04-14 NOTE — Assessment & Plan Note (Addendum)
 Mobility limited due to pain.  Last tox assure in 10/2023.  Refilled her pain medicine today and will get a tox assure. Plan Follow-up results Continue Voltaren  gel and as needed pain medications Orders:   diclofenac  Sodium (VOLTAREN ) 1 % GEL; Apply 4 grams topically every 6 (six) hours as needed (arthritis pain).   HYDROcodone -acetaminophen  (NORCO/VICODIN) 5-325 MG tablet; Take 1 tablet by mouth 2 (two) times daily as needed for up to 15 days for severe pain (pain score 7-10).   ToxAssure Select,+Antidepr,UR

## 2024-04-14 NOTE — Patient Instructions (Addendum)
 It was wonderful seeing you today!   1) I'm decreasing your Lasix  to 20 mg daily. Please elevate your feet when sitting and wear your compression stockings. Also, limit salt and sugar intake.   2) START the injection medicine. The pharmacy will show you how to use this medicine. You will inject it once weekly. This medicine will help you lose weight, which will reduce your blood pressure and help your sleep apnea  3) Please get your CT scan on 2/19  4) PLEASE TAKE ALL YOUR MEDICATIONS AS PRESCRIBED  5) I refilled your pain medication as well  If you have any questions please feel free to the call the clinic at anytime at [phone].  Have a blessed day,  Dr. Charmayne

## 2024-04-14 NOTE — Progress Notes (Signed)
 "  Established Patient Office Visit  Subjective   Patient ID: Nancy [name], female    DOB: 01-10-1956  Age: 69 y.o. MRN: [id number]  Patient last here in the beginning of January for a cough.  Today she presents for follow-up.  Her cough has improved but is not completely gone.  Her biggest concern today is chest soreness around the bilateral rib cage.  This has been going on for a couple of months and is worse in the morning and better when she gets up and moves around.  The pain is reproducible with sidebending and worse when she coughs.    Patient Active Problem List   Diagnosis Date Noted   Left hand pain 09/02/2023   Multifocal pneumonia 06/01/2023   (HFpEF) heart failure with preserved ejection fraction (HCC) 02/09/2023   Aortic atherosclerosis 08/24/2022   Impaired mobility 04/17/2022   Cervical radiculopathy 04/17/2022   Alpha thalassaemia minor 04/06/2022   Bilateral hand pain 07/10/2021   Rheumatoid factor positive 07/10/2021   Hyperlipidemia 04/28/2021   Mixed stress and urge incontinence 12/16/2020   Asymmetrical sensorineural hearing loss 08/09/2019   Polyarthralgia 12/02/2016   GERD (gastroesophageal reflux disease) 04/13/2014   Allergic rhinitis 01/24/2014   Morbid obesity with body mass index (BMI) of 45.0 to 49.9 in adult [facility]) 09/14/2013   Abnormal CT of brain 11/19/2011   MACULAR DEGENERATION, BILATERAL 05/22/2008   Essential hypertension 03/17/2006   SICCA SYNDROME 03/17/2006   KNEE PAIN, CHRONIC 03/17/2006   Chronic low back pain 03/17/2006   Obstructive sleep apnea 09/07/2003   Hypothyroidism 09/07/1998   Microcytic anemia 09/07/1998   Past Medical History:  Diagnosis Date   Acute cholecystitis 01/21/2021   AKI (acute kidney injury) 12/04/2018   Back pain    chronic low back pain L4-5 discectomy:11/99. degenerative thoracic spondylotic changes 4/07   Chest pain    neg adenosine myoview.   Healthcare maintenance 12/18/2014   Hypertension    no LVH  EKG-7/05, nl M/C ratio 4/07   Hypothyroidism    09/1998   Lower extremity edema    echo EF 55-65% w/o evidence of Dias dysfx.,    Menorrhagia    Microcytic anemia    09/1998. Hb-10.5/MCV 76. needs ferritin to determine ACD vs IDA   Morbid obesity (HCC)    Obstructive sleep apnea    severe 7/05 RD 161 per hr. /CPAP 18cwp   Plantar fasciitis of left foot 12/16/2020   Pneumonia due to COVID-19 virus 12/04/2018   Polyarthralgia    (knee/back/ankle) w/dx of fibromyalgia? given by [facility] rheum, knee pain chronic 2/2 obesity, fibromyalgia and Sjogren's.   Primary Sjogren's syndrome    anti Ro+;ANA>1:1280 in homogen pattern, negative ds DNA/RF/anti Smith/RNP/C3-4 comp/la/jo1/Scleroderma/centromere, neg HIV/ACE/Hep B/C, nl CXR 2/05, schirmer salivary glandtest not done, symptom rx:eye drops/prednisone /plaquenil  referral to [facility] rheum, Dr. Chauncy 3/07. saw [name] but stopped 2/2 cost.   S/P tooth extraction 12/22/2021   Subacute cough 08/15/2009   Qualifier: Diagnosis of   By: Joannie MD, Rosy       Uterine bleeding    Viral URI with cough 11/03/2017   Past Surgical History:  Procedure Laterality Date   CHOLECYSTECTOMY N/A 01/23/2021   Procedure: LAPAROSCOPIC CHOLECYSTECTOMY;  Surgeon: Sebastian Moles, MD;  Location: [facility] OR;  Service: General;  Laterality: N/A;   EXAMINATION UNDER ANESTHESIA  11/23/2011   Procedure: EXAM UNDER ANESTHESIA;  Surgeon: Gloris DELENA Hugger, MD;  Location: [facility] ORS;  Service: Gynecology;  Laterality: N/A;   HYSTEROSCOPY WITH D &  C  11/23/2011   Procedure: DILATATION AND CURETTAGE /HYSTEROSCOPY;  Surgeon: Gloris DELENA Hugger, MD;  Location: [facility] ORS;  Service: Gynecology;  Laterality: N/A;   INJECTION KNEE Left 2025   steroid   LUMBAR FUSION  03/09/1997   SHOULDER SURGERY  unk   TONSILLECTOMY     TUBAL LIGATION  03/10/1975   WISDOM TOOTH EXTRACTION          Objective:     BP (!) 160/90 (BP Location: Right Wrist, Patient Position: Sitting, Cuff  Size: Normal)   Pulse 91   Temp 97.7 F (36.5 C) (Oral)   SpO2 93%  BP Readings from Last 3 Encounters:  04/14/24 (!) 160/90  03/15/24 (!) 169/98  11/16/23 (!) 153/81   Wt Readings from Last 3 Encounters:  03/15/24 274 lb (124.3 kg)  11/16/23 268 lb 12.8 oz (121.9 kg)  11/03/23 250 lb (113.4 kg)      Physical Exam Vitals reviewed.  Constitutional:      Appearance: She is obese.  Eyes:     Conjunctiva/sclera: Conjunctivae normal.  Cardiovascular:     Rate and Rhythm: Normal rate and regular rhythm.     Heart sounds: Normal heart sounds.  Pulmonary:     Effort: Pulmonary effort is normal.     Breath sounds: Rhonchi present.     Comments: Right greater than left Musculoskeletal:     Right lower leg: Edema present.     Left lower leg: Edema present.     Comments: Bilateral pitting edema  Skin:    General: Skin is warm.     Comments: Negative for abdominal or back rash  Neurological:     General: No focal deficit present.     Mental Status: She is alert and oriented to person, place, and time.  Psychiatric:        Mood and Affect: Mood normal.        Behavior: Behavior normal.      No results found for any visits on 04/14/24.  Last CBC Lab Results  Component Value Date   WBC 4.9 03/15/2024   HGB 11.0 (L) 03/15/2024   HCT 36.5 03/15/2024   MCV 80 03/15/2024   MCH 24.0 (L) 03/15/2024   RDW 14.5 03/15/2024   PLT 281 03/15/2024   Last metabolic panel Lab Results  Component Value Date   GLUCOSE 101 (H) 03/15/2024   NA 136 03/15/2024   K 4.4 03/15/2024   CL 95 (L) 03/15/2024   CO2 30 (H) 03/15/2024   BUN 9 03/15/2024   CREATININE 0.87 03/15/2024   EGFR 71 03/15/2024   CALCIUM  9.2 03/15/2024   PROT 7.5 02/09/2023   ALBUMIN 3.4 (L) 02/09/2023   LABGLOB 3.2 12/16/2020   AGRATIO 1.3 12/16/2020   BILITOT 0.6 02/09/2023   ALKPHOS 61 02/09/2023   AST 13 (L) 02/09/2023   ALT 9 02/09/2023   ANIONGAP 12 05/29/2023      The 10-year ASCVD risk score  (Arnett DK, et al., 2019) is: 16.4%    Assessment & Plan:   Assessment & Plan Acute heart failure with preserved ejection fraction (HCC) At LOV, cough deemed to be related to pulmonary edema although with subsequent proBNP was normal.  It was recommended that she get a chest x-ray that day to rule out pneumonia and the impression recommended CT chest for further evaluation of ill-defined opacities.  She has been taking 40 mg furosemide  since then and reports improvement in her cough today.  Per dispense history, she  was prescribed furosemide  at the end of 2024 but has not been on it since then.  The 40 mg furosemide  given last month was a new prescription.  She appears to be hypervolemic on exam and has mild right sided basilar Rales.  She has had good output with the Lasix  and has taken about 15 of her 30 tablets.  Will continue her Lasix  at a lower dose and have her follow-up in 1 month to reassess her volume and cough.  Will also get a BMP today to assess kidney function. Plan Decrease furosemide  from 40 mg daily to 20 mg daily Follow-up BMP Reassess in 1 month Orders:   Basic metabolic panel with GFR   furosemide  (LASIX ) 40 MG tablet; Take 1/2 tablet (20 mg total) by mouth daily.  Muscle soreness She describes the pain as soreness. Her bilateral rib pain worsened with movement may be muscle soreness from coughing exacerbations. My concern for a cardiac etiology is very low. She is tender to palpation under the bilateral breasts.  Chest x-ray in early January recommended CT and this is now scheduled for 2/19.  Will follow-up CT results to see if that elicits any potential etiologies of her muscle soreness. Plan Follow-up CT results Reassess in 1 month Morbid obesity with body mass index (BMI) of 45.0 to 49.9 in adult [facility]) Was previously prescribed tirzepatide  back in 10/2023 for obesity, OSA and difficult to manage hypertension.  She does wear CPAP nightly.  She has not started this  medication but understands the association with her obesity towards her comorbidities.  She is agreeable to starting the medication. Plan Start tirzepatide , follow-up in 1 month Orders:   tirzepatide  (ZEPBOUND ) 2.5 MG/0.5ML Pen; Inject 2.5 mg into the skin once a week.  Essential hypertension 160/90.  Per dispense history, I worry that she does not maximize her current regimen of amlodipine  10 daily, chlorthalidone  25 daily losartan  100 mg daily and metoprolol  succinate 50 mg daily.  Instead of changing her medication regimen, emphasized medication adherence.  Will also try to modify contributing comorbidities such as OSA and obesity. Plan Continue current regimen Start tirzepatide  weekly    Chronic low back pain, unspecified back pain laterality, unspecified whether sciatica present Mobility limited due to pain.  Last tox assure in 10/2023.  Refilled her pain medicine today and will get a tox assure. Plan Follow-up results Continue Voltaren  gel and as needed pain medications Orders:   diclofenac  Sodium (VOLTAREN ) 1 % GEL; Apply 4 grams topically every 6 (six) hours as needed (arthritis pain).   HYDROcodone -acetaminophen  (NORCO/VICODIN) 5-325 MG tablet; Take 1 tablet by mouth 2 (two) times daily as needed for up to 15 days for severe pain (pain score 7-10).   ToxAssure Select,+Antidepr,UR   Return in about 4 weeks (around 05/12/2024) for HF and BP and pain fu.    Viktoria King, DO "

## 2024-04-14 NOTE — Assessment & Plan Note (Signed)
 160/90.  Per dispense history, I worry that she does not maximize her current regimen of amlodipine  10 daily, chlorthalidone  25 daily losartan  100 mg daily and metoprolol  succinate 50 mg daily.  Instead of changing her medication regimen, emphasized medication adherence.  Will also try to modify contributing comorbidities such as OSA and obesity. Plan Continue current regimen Start tirzepatide  weekly

## 2024-04-14 NOTE — Assessment & Plan Note (Addendum)
 Was previously prescribed tirzepatide  back in 10/2023 for obesity, OSA and difficult to manage hypertension.  She does wear CPAP nightly.  She has not started this medication but understands the association with her obesity towards her comorbidities.  She is agreeable to starting the medication. Plan Start tirzepatide , follow-up in 1 month Orders:   tirzepatide  (ZEPBOUND ) 2.5 MG/0.5ML Pen; Inject 2.5 mg into the skin once a week.

## 2024-04-27 ENCOUNTER — Ambulatory Visit (HOSPITAL_COMMUNITY)

## 2024-11-08 ENCOUNTER — Ambulatory Visit
